# Patient Record
Sex: Female | Born: 1950
Health system: Southern US, Community
[De-identification: ages and names within clinical notes are randomized; demographics above are authoritative.]

## PROBLEM LIST (undated history)

## (undated) DIAGNOSIS — I499 Cardiac arrhythmia, unspecified: Secondary | ICD-10-CM

## (undated) DIAGNOSIS — D649 Anemia, unspecified: Secondary | ICD-10-CM

## (undated) DIAGNOSIS — G249 Dystonia, unspecified: Secondary | ICD-10-CM

## (undated) DIAGNOSIS — G894 Chronic pain syndrome: Secondary | ICD-10-CM

## (undated) DIAGNOSIS — G459 Transient cerebral ischemic attack, unspecified: Secondary | ICD-10-CM

## (undated) DIAGNOSIS — M797 Fibromyalgia: Secondary | ICD-10-CM

## (undated) DIAGNOSIS — N952 Postmenopausal atrophic vaginitis: Secondary | ICD-10-CM

## (undated) DIAGNOSIS — E785 Hyperlipidemia, unspecified: Secondary | ICD-10-CM

## (undated) DIAGNOSIS — B019 Varicella without complication: Secondary | ICD-10-CM

## (undated) DIAGNOSIS — K219 Gastro-esophageal reflux disease without esophagitis: Secondary | ICD-10-CM

## (undated) DIAGNOSIS — K635 Polyp of colon: Secondary | ICD-10-CM

## (undated) DIAGNOSIS — I4891 Unspecified atrial fibrillation: Secondary | ICD-10-CM

## (undated) DIAGNOSIS — M1990 Unspecified osteoarthritis, unspecified site: Secondary | ICD-10-CM

## (undated) DIAGNOSIS — F329 Major depressive disorder, single episode, unspecified: Secondary | ICD-10-CM

## (undated) DIAGNOSIS — I639 Cerebral infarction, unspecified: Secondary | ICD-10-CM

## (undated) DIAGNOSIS — F32A Depression, unspecified: Secondary | ICD-10-CM

## (undated) DIAGNOSIS — E049 Nontoxic goiter, unspecified: Secondary | ICD-10-CM

## (undated) HISTORY — DX: Unspecified osteoarthritis, unspecified site: M19.90

## (undated) HISTORY — DX: Transient cerebral ischemic attack, unspecified: G45.9

## (undated) HISTORY — PX: BIOPSY THYROID: PRO38

## (undated) HISTORY — DX: Varicella without complication: B01.9

## (undated) HISTORY — PX: LAMINECTOMY: SHX219

## (undated) HISTORY — DX: Postmenopausal atrophic vaginitis: N95.2

## (undated) HISTORY — DX: Chronic pain syndrome: G89.4

## (undated) HISTORY — PX: TONSILLECTOMY AND ADENOIDECTOMY: SUR1326

## (undated) HISTORY — DX: Hyperlipidemia, unspecified: E78.5

## (undated) HISTORY — DX: Depression, unspecified: F32.A

## (undated) HISTORY — DX: Major depressive disorder, single episode, unspecified: F32.9

## (undated) HISTORY — DX: Dystonia, unspecified: G24.9

## (undated) HISTORY — DX: Gastro-esophageal reflux disease without esophagitis: K21.9

## (undated) HISTORY — DX: Nontoxic goiter, unspecified: E04.9

## (undated) HISTORY — PX: ATRIAL FIBRILLATION ABLATION: SHX5732

## (undated) HISTORY — PX: OTHER SURGICAL HISTORY: SHX169

## (undated) HISTORY — DX: Unspecified atrial fibrillation: I48.91

## (undated) HISTORY — DX: Polyp of colon: K63.5

## (undated) HISTORY — DX: Fibromyalgia: M79.7

---

## 1991-09-23 HISTORY — PX: VAGINAL HYSTERECTOMY: SUR661

## 1997-09-22 HISTORY — PX: OTHER SURGICAL HISTORY: SHX169

## 1998-09-22 HISTORY — PX: OTHER SURGICAL HISTORY: SHX169

## 2007-09-09 ENCOUNTER — Encounter: Payer: Self-pay | Admitting: Family Medicine

## 2012-06-15 LAB — PULMONARY FUNCTION TEST

## 2013-08-24 LAB — PULMONARY FUNCTION TEST

## 2013-10-18 ENCOUNTER — Encounter: Payer: Self-pay | Admitting: Family Medicine

## 2013-10-18 LAB — HM COLONOSCOPY: HM COLON: NORMAL

## 2014-08-29 LAB — PULMONARY FUNCTION TEST

## 2015-01-18 ENCOUNTER — Encounter: Payer: Self-pay | Admitting: Family Medicine

## 2015-01-18 ENCOUNTER — Ambulatory Visit (INDEPENDENT_AMBULATORY_CARE_PROVIDER_SITE_OTHER): Payer: BLUE CROSS/BLUE SHIELD | Admitting: Family Medicine

## 2015-01-18 VITALS — BP 92/58 | HR 61 | Temp 98.1°F | Ht 68.0 in | Wt 161.0 lb

## 2015-01-18 DIAGNOSIS — E785 Hyperlipidemia, unspecified: Secondary | ICD-10-CM | POA: Insufficient documentation

## 2015-01-18 DIAGNOSIS — G249 Dystonia, unspecified: Secondary | ICD-10-CM | POA: Diagnosis not present

## 2015-01-18 DIAGNOSIS — G894 Chronic pain syndrome: Secondary | ICD-10-CM | POA: Diagnosis not present

## 2015-01-18 DIAGNOSIS — I480 Paroxysmal atrial fibrillation: Secondary | ICD-10-CM

## 2015-01-18 DIAGNOSIS — G459 Transient cerebral ischemic attack, unspecified: Secondary | ICD-10-CM | POA: Insufficient documentation

## 2015-01-18 DIAGNOSIS — F329 Major depressive disorder, single episode, unspecified: Secondary | ICD-10-CM | POA: Insufficient documentation

## 2015-01-18 DIAGNOSIS — M1990 Unspecified osteoarthritis, unspecified site: Secondary | ICD-10-CM | POA: Insufficient documentation

## 2015-01-18 DIAGNOSIS — I4891 Unspecified atrial fibrillation: Secondary | ICD-10-CM | POA: Insufficient documentation

## 2015-01-18 DIAGNOSIS — E049 Nontoxic goiter, unspecified: Secondary | ICD-10-CM

## 2015-01-18 DIAGNOSIS — K219 Gastro-esophageal reflux disease without esophagitis: Secondary | ICD-10-CM | POA: Insufficient documentation

## 2015-01-18 DIAGNOSIS — N952 Postmenopausal atrophic vaginitis: Secondary | ICD-10-CM | POA: Insufficient documentation

## 2015-01-18 DIAGNOSIS — F32A Depression, unspecified: Secondary | ICD-10-CM | POA: Insufficient documentation

## 2015-01-18 DIAGNOSIS — M797 Fibromyalgia: Secondary | ICD-10-CM

## 2015-01-18 DIAGNOSIS — K635 Polyp of colon: Secondary | ICD-10-CM | POA: Insufficient documentation

## 2015-01-18 NOTE — Patient Instructions (Signed)
Sign release of information at the front desk for Dr. Margo AyeHall and Dr. Mordecai MaesSanchez. Definitely need last colonoscopy, any immunizations.   I would check with your insurance to see which psychiatrists are covered. Danielle Rocha locally has a very good reputation if covered. I would use scripts from Dr. Margo AyeHall for refills until then and then they could always phone those in if needed.   Let's check in about 6 weeks from now and hopefully I have both sets of records.   Look forward to meeting

## 2015-01-18 NOTE — Assessment & Plan Note (Signed)
S:described as psychogenic dystonia. Pain and twisting from upper chest and up ith triggers "wind, creamy food" on TID ativan per psychiatry previously.  A/P: Need records to further understand but apparently has severe dystonic psychogenic pain if off ativan. Reported this medication was prescribed by psych which would be atypical. Encouraged patient to get established with local psychiatry and she plans on doing that. Has refills reportedly through prior provider but was unable to locate in purse today-told her I would be willing to rx zoloft and remeron but not chronic ativan and not above 5mg  [place]

## 2015-01-18 NOTE — Assessment & Plan Note (Signed)
S:Xarelto anticoagulation. Flecainide antiarrythmic. Sounds to be in sinus today A/P: as long as remains controlled, potentially could continue to care for here but also could consider referral to cards. Continue current rx.

## 2015-01-18 NOTE — Assessment & Plan Note (Signed)
S:On disability. History of bilateral hip pain, low back pain. States hip pain has been undiagnosed but had been considered to be bursitis in past. Later had back surgery and fusion and has had worsened pain in low back since that time. Gabapentin 400 BID, percocet 1 tablet daily per prior provider.  A/P: we will obtain records. If percocet is once daily, discussed I may continue depending on reason vs. Consider pain management or reduce to vicodin or even potentially take off.

## 2015-01-18 NOTE — Progress Notes (Signed)
Danielle Conch, MD Phone: [phone]  Subjective:  Patient presents today to establish care. Chief complaint-noted.   See problem oriented charting Reports tetanus shot within 2 years, pneumovax within 2 years, colon cancer screening this year.  ROS- no chest pain, shortness of breath, palpitations. Continued low back and hip pain  The following were reviewed and entered/updated in epic: Past Medical History  Diagnosis Date  . Arthritis     DJD, low back, thumb  . Chicken pox   . Depression     zoloft , remeron  per psychiatry. ambien  per psychiatry to help wtih sleep element.   Marland Kitchen GERD (gastroesophageal reflux disease)     omeprazole OTC  . Atrial fibrillation     Xarelto anticoagulation. Flecainide antiarrythmic   . Hyperlipidemia     lovastatin   . Colon polyp     awaiting records  . Goiter     states multiple imaging tests, has had biopsies  . Dystonia     described as psychogenic dystonia. Pain and twisting from upper chest and up ith triggers "wind, creamy food" on TID ativan per psychiatry previously.   . Vaginal atrophy     estrace vaginal cream  . Chronic pain syndrome     On disability. History of bilateral hip pain, low back pain. Gabapentin 400 BID, percocet 1 tablet daily per prior provider.    Patient Active Problem List   Diagnosis Date Noted  . Atrial fibrillation     Priority: High  . Chronic pain syndrome     Priority: High  . Dystonia     Priority: High  . TIA (transient ischemic attack) 01/18/2015    Priority: Medium  . Hyperlipidemia     Priority: Medium  . Depression     Priority: Medium  . Goiter     Priority: Medium  . Vaginal atrophy     Priority: Low  . GERD (gastroesophageal reflux disease)     Priority: Low  . Colon polyp     Priority: Low  . Arthritis     Priority: Low   Past Surgical History  Procedure Laterality Date  . Piriformis release  1999  . Fusion l4-l5  2000  . Tonsillectomy and adenoidectomy   age 61  . Abdominal hysterectomy  1993    excess bleeding-benign-including ovaries and cervix  . Other surgical history      tennis elbow surgery    Family History  Problem Relation Age of Onset  . Alcohol abuse Mother   . Hypertension Father   . Alcohol abuse Father   . Pancreatic cancer Father   . Cancer Brother     spindle cell RLE  . Cancer Brother     tonsil cancer    Medications- reviewed and updated Current Outpatient Prescriptions  Medication Sig Dispense Refill  . estradiol (ESTRACE) 0.1 MG/GM vaginal cream Place 2 Applicatorfuls vaginally 3 (three) times a week.    . flecainide (TAMBOCOR) 100 MG tablet Take 100 mg by mouth 2 (two) times daily.    Marland Kitchen gabapentin (NEURONTIN) 400 MG capsule Take 400 mg by mouth 2 (two) times daily.    Marland Kitchen LORazepam (ATIVAN) 1 MG tablet Take 1 mg by mouth 3 (three) times daily.    Marland Kitchen lovastatin (MEVACOR) 20 MG tablet Take 20 mg by mouth at bedtime.    . mirtazapine (REMERON) 30 MG tablet Take 30 mg by mouth at bedtime.    . Multiple Vitamins-Minerals (MULTIVITAMIN GUMMIES ADULTS PO) Take by mouth.    Marland Kitchen  omeprazole (PRILOSEC) 20 MG capsule Take 20 mg by mouth daily.    Marland Kitchen oxyCODONE-acetaminophen (PERCOCET/ROXICET) 5-325 MG per tablet Take 1 tablet by mouth daily.    . rivaroxaban (XARELTO) 20 MG TABS tablet Take 20 mg by mouth daily with supper.    . sertraline (ZOLOFT) 100 MG tablet Take 100 mg by mouth daily.    Marland Kitchen zolpidem (AMBIEN) 10 MG tablet Take 10 mg by mouth at bedtime as needed for sleep.       Allergies-reviewed and updated Allergies  Allergen Reactions  . Ciprofloxacin Diarrhea  . Clindamycin/Lincomycin Diarrhea  . Reglan [Metoclopramide]     Suicidal thoughts  . Vibramycin [Doxycycline] Diarrhea    History   Social History  . Marital Status: Married    Spouse Name: N/A  . Number of Children: N/A  . Years of Education: N/A   Social History Main Topics  . Smoking status: Never Smoker   . Smokeless tobacco: Not on file    . Alcohol Use: No  . Drug Use: No  . Sexual Activity: Not on file   Other Topics Concern  . Not on file   Social History Narrative   Family: Married. Husband works as Hospital doctor for [employer]. 2 children from previous marriage 1 from current. Son Jeannett Senior lives with them and has down's syndrome.       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 Moved to [facility] from [id number] and became disabled in that time frame due to hip/back issues.       Hobbies: time with son    ROS--See HPI/Subejctive , otherwise full ROS was completed and negative except as noted above  Objective: BP 92/58 mmHg  Pulse 61  Temp(Src) 98.1 F (36.7 C)  Ht  (1.727 m)  Wt 161 lb (73.029 kg)  BMI 24.49 kg/m2 Gen: NAD, resting comfortably HEENT: Mucous membranes are moist. Oropharynx normal. TM normal. Eyes: sclera and lids normal, PERRLA Neck: symmetrical diffuse thyroid enlargement (goiter), no cervical lymphadenopathy CV: RRR no murmurs rubs or gallops Lungs: CTAB no crackles, wheeze, rhonchi Abdomen: soft/nontender/nondistended/normal bowel sounds. No rebound or guarding.  Ext: no edema Skin: warm, dry, no rash Neuro: 5/5 strength in upper and lower extremities, normal gait, normal reflexes   Assessment/Plan:  Atrial fibrillation S:Xarelto anticoagulation. Flecainide antiarrythmic. Sounds to be in sinus today A/P: as long as remains controlled, potentially could continue to care for here but also could consider referral to cards. Continue current rx.     Chronic pain syndrome S:On disability. History of bilateral hip pain, low back pain. States hip pain has been undiagnosed but had been considered to be bursitis in past. Later had back surgery and fusion and has had worsened pain in low back since that time. Gabapentin 400 BID, percocet 1 tablet daily per prior provider.  A/P: we will obtain records. If percocet is once daily, discussed I may continue depending on  reason vs. Consider pain management or reduce to vicodin or even potentially take off.     Dystonia S:described as psychogenic dystonia. Pain and twisting from upper chest and up ith triggers "wind, creamy food" on TID ativan per psychiatry previously.  A/P: Need records to further understand but apparently has severe dystonic psychogenic pain if off ativan. Reported this medication was prescribed by psych which would be atypical. Encouraged patient to get established with local psychiatry and she plans on doing that. Has refills reportedly through prior provider but was unable to locate in purse  today-told her I would be willing to rx zoloft and remeron but not chronic ativan and not above 5mg  ambien    No rx provided today. Need records especially around narcotics/benzos. Follow up 6 weeks and hope to have records by then from both PCP and psychiatrist that was treating dystonia with benzos reportedly.

## 2015-01-19 NOTE — Progress Notes (Signed)
Records reviewed from psychiatry.   Initial eval on 01/30/14 by Dr. Margo AyeHall  "started having psychiatric problems after she developed medical problems/fibromyalgia in her late 30s in [id number]. starter with him pain, has received numerous treatments and surgeries that haven't helped. Became more and more depressed  She was treated with large regimen of pain medications which has been decreased. Deals with chronic pain which is up and down, varies with weather. Likes the summer. 'I hate the winter'  Was hospitalized for depression and SI 5 years ago. Reglan was found to be the offender, did better. Mood is ok for the most part, has been feeling more depressed recently. Worries often.   Started taking zoloft about a month ago. Feels a little jumpy on 50mg /am. Has been taking Remeron and Ambien for past few years.   Has Problems with dystonia, affects her mouth and upper body. Takes ativan as needed. Started about a year ago. ...  She has taken ritalin (hyped her up/increased her dystonia), wellbutrin (made her sick),  lexapro (helped her at first). cymbalta made her nauseous. Took inderal for a while).  ...  A/P  1. Continue her current dose of medications. Start cogentin 1mg /pm. Consider changing zoloft to lexapro next visit (feels anxious on zoloft).  "  Review of other pages mention dystonia controlled by psychiatric medications. Patient told me at visit she came off cogentin for unclear reasons.

## 2015-01-31 ENCOUNTER — Encounter: Payer: Self-pay | Admitting: Family Medicine

## 2015-02-06 DIAGNOSIS — M797 Fibromyalgia: Secondary | ICD-10-CM | POA: Insufficient documentation

## 2015-02-06 NOTE — Progress Notes (Addendum)
Records received from Dr. Mordecai MaesSanchez, prior PCP  Had been seen by cardiology, ENT, gastroenterology, urology, pan management, psychiatry.  Multiple ED visits and hospital admissions noted.   No paps or mammograms noted.   Pain management Pain was managed with percocet once a day by prior PCP most recently. Had been weaned from more frequent doses and doses up to 10/325 every 4 hours while also on fentanyl patch. had been on fentanyl patches in 2014. Gabapentin had also been tried Lidocaine patches used in 2015 03/08/14 Hip pain >5 years with severe discomfort and episodic flare ups. Unrelated to actvity.  03/04/13-mentions an ablation procedure scheduled without specifics. "she should not be definied by her pain and get free of the chronic meds"  Prior PCP #2 2012 Had prior x-rays of hip and spine 2012 scanned in. Degenerative changes primarily as well as laminectomy noted and spine fusion.   Prior PCP #3 2011 mentions "chronic bilateral hip pain and fibromyalgia" on narcotics  No copy of prior MRI noted-though "repeat MRI" mentioned at times in notes   Dystonia Controlled on BID ativan 1mg  through Dr. Margo AyeHall. Had been on cogentin but thought it made symptoms worse" On 06/27/14-"muscular twitching start in again-like previous episodes, no known reason. Has appt later in the month with Dr. Saunders RevelVonCoelin." continued ativan 01/23/14 "admits when she sees her therapist and she pulls on her arms she is able to lessen the movement" 01/24/15-"just saw Dr. Saunders RevelVonCoelin in movement disorders clinic-zoloft increased to 50mg "03/04/13- "[facility]. It is their opinion this is not organic and most likely represents a conversion reaction" mentions wind and stress as triggers. "It is the opinon of both Jefferson FuelBrenda Scribner and the nurologist that long term benzo use needs to be avoided and probably best medication is an SSRI. Dr. Meda KlinefelterScribner will see her monthly and counselor,  Fredia BeetsKaitlyn Waldrip, will see her weekly"  Depression Fatigue on wellbutrin, tolerated zoloft better On remeron previously SSRI preferred due to dystonia Marital discord noted.   Thyroid goiter 03/23/12- "stable last u/s stable for past two years and bx negative"  HLD-last notes state increase to atorvastatin 20mg 

## 2015-02-08 ENCOUNTER — Other Ambulatory Visit: Payer: Self-pay | Admitting: *Deleted

## 2015-02-08 DIAGNOSIS — Z1239 Encounter for other screening for malignant neoplasm of breast: Secondary | ICD-10-CM

## 2015-02-08 DIAGNOSIS — M810 Age-related osteoporosis without current pathological fracture: Secondary | ICD-10-CM

## 2015-03-01 ENCOUNTER — Encounter: Payer: Self-pay | Admitting: Family Medicine

## 2015-03-01 ENCOUNTER — Ambulatory Visit (INDEPENDENT_AMBULATORY_CARE_PROVIDER_SITE_OTHER): Payer: BLUE CROSS/BLUE SHIELD | Admitting: Family Medicine

## 2015-03-01 VITALS — BP 110/60 | HR 64 | Temp 98.2°F | Wt 162.0 lb

## 2015-03-01 DIAGNOSIS — G894 Chronic pain syndrome: Secondary | ICD-10-CM | POA: Diagnosis not present

## 2015-03-01 DIAGNOSIS — Z780 Asymptomatic menopausal state: Secondary | ICD-10-CM

## 2015-03-01 DIAGNOSIS — F32A Depression, unspecified: Secondary | ICD-10-CM

## 2015-03-01 DIAGNOSIS — F329 Major depressive disorder, single episode, unspecified: Secondary | ICD-10-CM

## 2015-03-01 DIAGNOSIS — G249 Dystonia, unspecified: Secondary | ICD-10-CM

## 2015-03-01 MED ORDER — HYDROCODONE-ACETAMINOPHEN 5-325 MG PO TABS: 0.5000 | ORAL_TABLET | Freq: Three times a day (TID) | ORAL | Status: DC | PRN

## 2015-03-01 NOTE — Assessment & Plan Note (Signed)
Reviewed prior notes which described this as as psychogenic dystonia (not organic per movement disorder clinic). Emerson Monte NP now managing ativan usage along with zoloft and ambien. Asked patient to continue regular follow up

## 2015-03-01 NOTE — Assessment & Plan Note (Signed)
Continued bilateral hip pain, low back pain. Percocet reportedly does not help very much but makes pain tolerable. She would prefer more level pain pattern and we opted to change to vicodin and may use 1/2 tab of 5/325 up to 3x a day. Patient has had extensive workup and multiple therapies tried. I am not sure referral would be beneficial. Plan would be not to go above 10 total of hydrocodone per day (current plan is 7.5). 3 months rx given. Will need pain contract with long term use and drug testing.

## 2015-03-01 NOTE — Patient Instructions (Addendum)
Get mammogram (call) and dexa/bone density (at check out)scheduled .  Glad you are having a good experience with Emerson Monte. I would love if you could have them send [place] a copy of their notes next time you see them.   Let's try vicodin 1/2 a tab 3x a day. You alternatively can take full tab in AM and half time in PM (still with total of 1.5 pills a day). Let's see if this can give you more stability/flexibility in your pain control  Follow up in 3 months. Tell Jeannett Senior I said hello!

## 2015-03-01 NOTE — Assessment & Plan Note (Signed)
Controlled. Continue follow up with psychiatry and continue meds zoloft 200mg  (recent increase), remeron 30mg  per psychiatry. ambien 10mg  per psychiatry to help with sleep element.

## 2015-03-01 NOTE — Progress Notes (Signed)
Tana Conch, MD  Subjective:  Danielle Rocha is a 64 y.o. year old very pleasant female patient who presents with:  Dystonia/Depression Seeing Emerson Monte NP. Increased zoloft to . Appointment upcoming as well. Feels like back to her "old old self, happier, able to laugh". Still deals with depression due to the pain she is dealing with. They prescribed the ativan 3x a day. They are prescribing ambien as well  Chronic Pain Has had injections in her hip and back including by pain doctor. Did not want stimulator in her spine. Has tried specialized treatments in [place] and spent 1000s of dollars on cleanses without relief. Prior notes showed workup by orthopedics, neurosurgery, pain management and continues to have hip and back pain. She feels reasonable for about 5 hours of the day after percocet and then experiences severe pain the rest of the day. Has the hardest time at night.   ROS- No SI/HI. Continued occasional dystonic movements- controlled with extra ativan (usually twice a day but has option for 3x a day through psych)  Past Medical History- atrial fibrillation, hyperlipidemia, history TIA  Medications- reviewed and updated Current Outpatient Prescriptions  Medication Sig Dispense Refill  . estradiol (ESTRACE) 0.1 MG/GM vaginal cream Place 2 Applicatorfuls vaginally 3 (three) times a week.    . flecainide (TAMBOCOR) 100 MG tablet Take 100 mg by mouth 2 (two) times daily.    Marland Kitchen gabapentin (NEURONTIN) 400 MG capsule Take 400 mg by mouth 2 (two) times daily.    Marland Kitchen LORazepam (ATIVAN) 1 MG tablet Take 1 mg by mouth 3 (three) times daily.    Marland Kitchen lovastatin (MEVACOR) 20 MG tablet Take 20 mg by mouth at bedtime.    . mirtazapine (REMERON) 30 MG tablet Take 30 mg by mouth at bedtime.    . Multiple Vitamins-Minerals (MULTIVITAMIN GUMMIES ADULTS PO) Take by mouth.    Marland Kitchen omeprazole (PRILOSEC) 20 MG capsule Take 20 mg by mouth daily.    Marland Kitchen oxyCODONE-acetaminophen (PERCOCET/ROXICET)  5-325 MG per tablet Take 1 tablet by mouth daily.    . rivaroxaban (XARELTO) 20 MG TABS tablet Take 20 mg by mouth daily with supper.    . sertraline (ZOLOFT) 100 MG tablet Take 100 mg by mouth 2 (two) times daily.     Marland Kitchen zolpidem (AMBIEN) 10 MG tablet Take 10 mg by mouth at bedtime as needed for sleep.     No current facility-administered medications for this visit.    Objective: BP 110/60 mmHg  Pulse 64  Temp(Src) 98.2 F (36.8 C)  Wt 162 lb (73.483 kg) Gen: NAD, resting comfortably CV: RRR no murmurs rubs or gallops Lungs: CTAB no crackles, wheeze, rhonchi Abdomen: soft/nontender/nondistended/normal bowel sounds.   Pain with palpation over bilateral hips and low back. Normal gait. Normal strength in legs.  Ext: no edema Skin: warm, dry, no rash Neuro: grossly normal, moves all extremities   Assessment/Plan:  Chronic pain syndrome Continued bilateral hip pain, low back pain. Percocet reportedly does not help very much but makes pain tolerable. She would prefer more level pain pattern and we opted to change to vicodin and may use 1/2 tab of 5/325 up to 3x a day. Patient has had extensive workup and multiple therapies tried. I am not sure referral would be beneficial. Plan would be not to go above 10 total of hydrocodone per day (current plan is 7.5). 3 months rx given. Will need pain contract with long term use and drug testing.   Dystonia Reviewed prior notes which  described this as as psychogenic dystonia (not organic per movement disorder clinic). Emerson Monte NP now managing ativan usage along with zoloft and ambien. Asked patient to continue regular follow up  Depression Controlled. Continue follow up with psychiatry and continue meds zoloft 200mg  (recent increase), remeron 30mg  per psychiatry. ambien 10mg  per psychiatry to help with sleep element.   3 month follow up   Orders Placed This Encounter  Procedures  . DG Bone Density    Standing Status: Future     Number  of Occurrences:      Standing Expiration Date: 04/30/2016    Order Specific Question:  Reason for Exam (SYMPTOM  OR DIAGNOSIS REQUIRED)    Answer:  post menopausal    Order Specific Question:  Preferred imaging location?    Answer:  [facility]    Meds ordered this encounter  Medications  .  HYDROcodone-acetaminophen (NORCO/VICODIN) 5-325 MG per tablet    Sig: Take 0.5 tablets by mouth 3 (three) times daily as needed. May fill 03/01/15    Dispense:  45 tablet    Refill:  0  .  HYDROcodone-acetaminophen (NORCO/VICODIN) 5-325 MG per tablet    Sig: Take 0.5 tablets by mouth 3 (three) times daily as needed. May fill 03/31/15    Dispense:  45 tablet    Refill:  0  . HYDROcodone-acetaminophen (NORCO/VICODIN) 5-325 MG per tablet    Sig: Take 0.5 tablets by mouth 3 (three) times daily as needed. May fill 05/01/15    Dispense:  45 tablet    Refill:  0   >50% of 25 minute office visit was spent on counseling (dealing with chronic pain, realizing likely to have continued pain, discussion about referral options which patient largely nor interested in as not sure what could be offered that has not been tried, encouraging continued psych follow up) and coordination of care

## 2015-03-09 ENCOUNTER — Ambulatory Visit: Payer: Self-pay | Admitting: Family Medicine

## 2015-05-07 ENCOUNTER — Other Ambulatory Visit: Payer: Self-pay | Admitting: Family Medicine

## 2015-05-07 MED ORDER — RIVAROXABAN 20 MG PO TABS: 20.0000 mg | ORAL_TABLET | Freq: Every day | ORAL | Status: DC

## 2015-05-07 NOTE — Telephone Encounter (Signed)
Pt asking for a 90 day rx.

## 2015-06-01 ENCOUNTER — Encounter: Payer: Self-pay | Admitting: Family Medicine

## 2015-06-01 ENCOUNTER — Ambulatory Visit (INDEPENDENT_AMBULATORY_CARE_PROVIDER_SITE_OTHER): Payer: BLUE CROSS/BLUE SHIELD | Admitting: Family Medicine

## 2015-06-01 VITALS — BP 116/68 | HR 69 | Temp 98.2°F | Wt 164.0 lb

## 2015-06-01 DIAGNOSIS — I480 Paroxysmal atrial fibrillation: Secondary | ICD-10-CM

## 2015-06-01 DIAGNOSIS — N950 Postmenopausal bleeding: Secondary | ICD-10-CM

## 2015-06-01 DIAGNOSIS — G894 Chronic pain syndrome: Secondary | ICD-10-CM | POA: Diagnosis not present

## 2015-06-01 MED ORDER — HYDROCODONE-ACETAMINOPHEN 5-325 MG PO TABS: 0.5000 | ORAL_TABLET | Freq: Three times a day (TID) | ORAL | Status: DC | PRN

## 2015-06-01 NOTE — Patient Instructions (Addendum)
Get mammogram scheduled.  We will call you within a week about your referral to cardiology and gynecology. If you do not hear within 2 weeks, give [place] a call.   Refilled meds for 3 months  Make sure to get your flu shot

## 2015-06-01 NOTE — Assessment & Plan Note (Signed)
S:Chronic pain syndrome- bilateral hip, low back. 1/2 tab vicodin 3x a day from percocet once a day. She notices this does not help as much with each dose but gives her more sustained results. Hoarse in afternoons or when pain gets worse. Hips hurt with activity A/P: refilled vicodin to use a total of 7.5mg  per day. Discussed could use  percocet total per day such as 1/2 tab BID but she would prefer TID dosing. Follow up 3 months.

## 2015-06-01 NOTE — Assessment & Plan Note (Signed)
S: Patient was seeing cardiology on yearly basis and would liek to reestablish. Not in a fib today. On Xarelto anticoagulation. Flecainide antiarrythmic. Has not had any episodes of a fib reportedly since being on flecainide. Has had ablation in past.  A/P: refer to a fib clinic per patient request. Fortunately stable and doing well.

## 2015-06-01 NOTE — Progress Notes (Signed)
Tana Conch, MD  Subjective:  Danielle Rocha is a 64 y.o. year old very pleasant female patient who presents for/with See problem oriented charting ROS- no chest pain, shortness of breath, palpitations. No abdominal pain. Chronic hip and low back pain. Continues to have tight neck and dystonic reactions at times.   Past Medical History-  Patient Active Problem List   Diagnosis Date Noted  . Fibromyalgia 02/06/2015    Priority: High  . Atrial fibrillation     Priority: High  . Chronic pain syndrome     Priority: High  . Dystonia     Priority: High  . Colon polyp     Priority: High  . TIA (transient ischemic attack) 01/18/2015    Priority: Medium  . Hyperlipidemia     Priority: Medium  . Depression     Priority: Medium  . Goiter     Priority: Medium  . Vaginal atrophy     Priority: Low  . GERD (gastroesophageal reflux disease)     Priority: Low  . Arthritis     Priority: Low    Medications- reviewed and updated Current Outpatient Prescriptions  Medication Sig Dispense Refill  . estradiol (ESTRACE) 0.1 MG/GM vaginal cream Place 2 Applicatorfuls vaginally 3 (three) times a week.    . flecainide (TAMBOCOR) 100 MG tablet Take 100 mg by mouth 2 (two) times daily.    Marland Kitchen gabapentin (NEURONTIN) 400 MG capsule Take 400 mg by mouth 2 (two) times daily.    Marland Kitchen LORazepam (ATIVAN) 1 MG tablet Take 1 mg by mouth 3 (three) times daily.    Marland Kitchen lovastatin (MEVACOR) 20 MG tablet Take 20 mg by mouth at bedtime.    . mirtazapine (REMERON) 30 MG tablet Take 30 mg by mouth at bedtime.    . Multiple Vitamins-Minerals (MULTIVITAMIN GUMMIES ADULTS PO) Take by mouth.    Marland Kitchen omeprazole (PRILOSEC) 20 MG capsule Take 20 mg by mouth daily.    . rivaroxaban (XARELTO) 20 MG TABS tablet Take 1 tablet (20 mg total) by mouth daily with supper. 90 tablet 3  . sertraline (ZOLOFT) 100 MG tablet Take 100 mg by mouth 2 (two) times daily.     Marland Kitchen HYDROcodone-acetaminophen (NORCO/VICODIN) 5-325 MG per tablet Take 0.5  tablets by mouth 3 (three) times daily as needed. May fill 08/01/15 46 tablet 0  . zolpidem (AMBIEN) 10 MG tablet Take 10 mg by mouth at bedtime as needed for sleep.     No current facility-administered medications for this visit.    Objective: BP 116/68 mmHg  Pulse 69  Temp(Src) 98.2 F (36.8 C)  Wt 164 lb (74.39 kg) Gen: NAD, resting comfortably, reports pain in hips and low back CV: RRR no murmurs rubs or gallops Lungs: CTAB no crackles, wheeze, rhonchi Abdomen: soft/nontender/nondistended/normal bowel sounds. No rebound or guarding.  Ext: trace edema Skin: warm, dry, no rash  Neuro: grossly normal, moves all extremities  Assessment/Plan:  Chronic pain syndrome S:Chronic pain syndrome- bilateral hip, low back. 1/2 tab vicodin 3x a day from percocet once a day. She notices this does not help as much with each dose but gives her more sustained results. Hoarse in afternoons or when pain gets worse. Hips hurt with activity A/P: refilled vicodin to use a total of 7.5mg  per day. Discussed could use  percocet total per day such as 1/2 tab BID but she would prefer TID dosing. Follow up 3 months.    Atrial fibrillation S: Patient was seeing cardiology on yearly basis  and would liek to reestablish. Not in a fib today. On Xarelto anticoagulation. Flecainide antiarrythmic. Has not had any episodes of a fib reportedly since being on flecainide. Has had ablation in past.  A/P: refer to a fib clinic per patient request. Fortunately stable and doing well.    Postmenopausal bleeding  S:Vaginal discharge- at times bloody, brown. 2-3 months sporadic. Hysterectomy. Does use estrace cream. Not sexually active.  A/P: Offered GYN exam today for wet prep, patient would prefer to follow up with GYN- does not have uterus or cervix reportedly.   In related womens health issues- advised patient to get her mammogram 3 month follow up   Orders Placed This Encounter  Procedures  . Ambulatory referral  to Gynecology    Referral Priority:  Routine    Referral Type:  Consultation    Referral Reason:  Specialty Services Required    Requested Specialty:  Gynecology    Number of Visits Requested:  1  . Ambulatory referral to Cardiology    Referral Priority:  Routine    Referral Type:  Consultation    Referral Reason:  Specialty Services Required    Requested Specialty:  Cardiology    Number of Visits Requested:  1    Meds ordered this encounter  Medications  .  HYDROcodone-acetaminophen (NORCO/VICODIN) 5-325 MG per tablet    Sig: Take 0.5 tablets by mouth 3 (three) times daily as needed. May fill 06/01/15    Dispense:  46 tablet    Refill:  0  . : HYDROcodone-acetaminophen (NORCO/VICODIN) 5-325 MG per tablet    Sig: Take 0.5 tablets by mouth 3 (three) times daily as needed. May fill 07/01/15    Dispense:  46 tablet    Refill:  0  . HYDROcodone-acetaminophen (NORCO/VICODIN) 5-325 MG per tablet    Sig: Take 0.5 tablets by mouth 3 (three) times daily as needed. May fill 08/01/15    Dispense:  46 tablet    Refill:  0

## 2015-06-06 ENCOUNTER — Ambulatory Visit (INDEPENDENT_AMBULATORY_CARE_PROVIDER_SITE_OTHER): Payer: BLUE CROSS/BLUE SHIELD | Admitting: Obstetrics and Gynecology

## 2015-06-06 ENCOUNTER — Encounter: Payer: Self-pay | Admitting: Obstetrics and Gynecology

## 2015-06-06 VITALS — BP 118/60 | HR 64 | Resp 14 | Ht 68.0 in | Wt 162.0 lb

## 2015-06-06 DIAGNOSIS — N816 Rectocele: Secondary | ICD-10-CM | POA: Diagnosis not present

## 2015-06-06 DIAGNOSIS — N952 Postmenopausal atrophic vaginitis: Secondary | ICD-10-CM

## 2015-06-06 DIAGNOSIS — N950 Postmenopausal bleeding: Secondary | ICD-10-CM | POA: Diagnosis not present

## 2015-06-06 MED ORDER — ESTRADIOL 10 MCG VA TABS: ORAL_TABLET | VAGINAL | Status: DC

## 2015-06-06 NOTE — Patient Instructions (Signed)
Look for a coupon on line for Vagifem

## 2015-06-06 NOTE — Progress Notes (Signed)
Patient ID: Danielle Rocha, female   DOB: Nov 12, 1950, 64 y.o.   MRN: [id number] 64 y.o. [id number] MarriedCaucasianF, sent by Dr Tana Conch for a consultation for vaginal spotting.  The patient has a h/o a TVH in the early 90's for abnormal bleeding. She c/o sporadic red vaginal spotting for the last 1.5 months, in the last week it has been daily and brown. She had been using estrace cream intermittently. Last used it 6 weeks ago. Was using it about 1 x a week. She is sexually active without penetration, which is fine for them.  She denies pruritus or discomfort. She is sure it is coming from her vagina. She has a known rectocele and cystocele. She does have a bulge with valsalva, occasionally needs to push on the perineum to defecate. No bladder issues. No abdominal pain.     No LMP recorded. Patient is postmenopausal.          Sexually active: No.  She is without penetration The current method of family planning is status post hysterectomy.    Exercising: Yes.    walking Smoker:  no  Health Maintenance: Pap:  Years ago History of abnormal Pap:  no MMG:  2014 normal Colonoscopy:  10-18-13 polyps  BMD:   1999 normal  TDaP: 06-13-12 Gardasil: N/A   reports that she has never smoked. She has never used smokeless tobacco. She reports that she does not drink alcohol or use illicit drugs.  Past Medical History  Diagnosis Date  . Arthritis     DJD, low back, thumb  . Chicken pox   . Depression     zoloft 100mg , remeron 30mg  per psychiatry. ambien 10mg  per psychiatry to help wtih sleep element.   Marland Kitchen GERD (gastroesophageal reflux disease)     omeprazole OTC  . Atrial fibrillation     Xarelto anticoagulation. Flecainide antiarrythmic   . Hyperlipidemia     lovastatin 20mg   . Colon polyp     awaiting records  . Goiter     states multiple imaging tests, has had biopsies  . Dystonia     described as psychogenic dystonia. Pain and twisting from upper chest and up ith triggers "wind, creamy  food" on TID ativan per psychiatry previously.   . Vaginal atrophy     estrace vaginal cream  . Chronic pain syndrome     On disability. History of bilateral hip pain, low back pain. Gabapentin 400 BID, percocet 1 tablet daily per prior provider.   . Fibromyalgia   . TIA (transient ischemic attack)     Past Surgical History  Procedure Laterality Date  . Piriformis release  1999  . Fusion l4-l5  2000  . Tonsillectomy and adenoidectomy  age 85  . Vaginal hysterectomy  1993  . Other surgical history      tennis elbow surgery  . Biopsy thyroid    . Laminectomy      L4-L5  . Atrial fibrillation ablation      Current Outpatient Prescriptions  Medication Sig Dispense Refill  . flecainide (TAMBOCOR) 100 MG tablet Take 100 mg by mouth 2 (two) times daily.    Marland Kitchen gabapentin (NEURONTIN) 400 MG capsule Take 400 mg by mouth 2 (two) times daily.    Marland Kitchen HYDROcodone-acetaminophen (NORCO/VICODIN) 5-325 MG per tablet Take 0.5 tablets by mouth 3 (three) times daily as needed. May fill 08/01/15 46 tablet 0  . lamoTRIgine (LAMICTAL) 25 MG tablet Take 25 mg by mouth daily.    Marland Kitchen LORazepam (ATIVAN) 1  MG tablet Take 1 mg by mouth 3 (three) times daily.    Marland Kitchen lovastatin (MEVACOR) 20 MG tablet Take 20 mg by mouth at bedtime.    . mirtazapine (REMERON) 30 MG tablet Take 30 mg by mouth at bedtime.    . Multiple Vitamins-Minerals (MULTIVITAMIN GUMMIES ADULTS PO) Take by mouth.    Marland Kitchen omeprazole (PRILOSEC) 20 MG capsule Take 20 mg by mouth daily.    . rivaroxaban (XARELTO) 20 MG TABS tablet Take 1 tablet (20 mg total) by mouth daily with supper. 90 tablet 3  . sertraline (ZOLOFT) 100 MG tablet Take 100 mg by mouth 2 (two) times daily.     Marland Kitchen zolpidem (AMBIEN) 10 MG tablet Take 10 mg by mouth at bedtime as needed for sleep.    . Estradiol (VAGIFEM) 10 MCG TABS vaginal tablet Place 1 tablet vaginal qhs x 1 week, the use 1 tablet per vagina 2 x a week at bedtime 24 tablet 4   No current facility-administered medications  for this visit.    Family History  Problem Relation Age of Onset  . Alcohol abuse Mother   . Hypertension Father   . Alcohol abuse Father   . Pancreatic cancer Father   . Cancer Brother     spindle cell RLE  . Cancer Brother     tonsil cancer  . Skin cancer Brother     Review of Systems  Exam:   BP 118/60 mmHg  Pulse 64  Resp 14  Ht  (1.727 m)  Wt 162 lb (73.483 kg)  BMI 24.64 kg/m2  Weight change: @ Height:   Height:  (172.7 cm)  Ht Readings from Last 3 Encounters:  06/06/15  (1.727 m)  01/18/15  (1.727 m)    General appearance: alert, cooperative and appears stated age Abdomen: soft, non-tender;  no masses   Pelvic: External genitalia:  no lesions, mild atrophy              Urethra:  normal appearing urethra with no masses, tenderness or lesions              Bartholins and Skenes: normal                 Vagina: atrophic vaginal mucosa, one spot of friability at the vaginal cuff, no lesions, treated with silver nitrate. Grade 1 rectocele with valsalva               Cervix: absent               Bimanual Exam:  Uterus:  uterus absent              Adnexa: No masses, mildly tender in the left adnexal region.               Rectovaginal: Confirms               Anus:  normal sphincter tone, no lesions  Chaperone was present for exam.  A:  PMP vaginal bleeding, s/p TVH  Atrophic vaginal mucosa, friable  Rectocele, mild, not symptomatic  P:   Start vagifem (she doesn't like estrace cream), use 1 tablet qhs x 1 week, then 1 x 2 x a week  She will return if the spotting persists  Return if she develops symptoms from her rectocele     Cc: Dr Tana Conch

## 2015-07-21 ENCOUNTER — Ambulatory Visit (INDEPENDENT_AMBULATORY_CARE_PROVIDER_SITE_OTHER): Payer: BLUE CROSS/BLUE SHIELD | Admitting: Family Medicine

## 2015-07-21 ENCOUNTER — Encounter: Payer: Self-pay | Admitting: Family Medicine

## 2015-07-21 VITALS — BP 118/64 | HR 67 | Temp 98.5°F | Ht 68.0 in

## 2015-07-21 DIAGNOSIS — M25562 Pain in left knee: Secondary | ICD-10-CM | POA: Diagnosis not present

## 2015-07-21 NOTE — Patient Instructions (Signed)
[facility] and ice several times per day.

## 2015-07-21 NOTE — Progress Notes (Signed)
Pre visit review using our clinic review tool, if applicable. No additional management support is needed unless otherwise documented below in the visit note. 

## 2015-07-21 NOTE — Progress Notes (Signed)
Subjective:    Patient ID: Danielle Rocha, female    DOB: 08/31/1951, 64 y.o.   MRN: [id number]  HPI Patient seen Saturday clinic with left anterior knee pain. Last Sunday she was at church and slipped on something on the floor and fell and landed directly onto her left knee. She noticed some bruising and pain right away. She's not done any icing. She takes Xarelto for atrial fibrillation and thus avoids nonsteroidals. She's had some pain with ambulation since then.  She also some bruising. She's not had any locking or giving way of the knee. Pain is moderate. Patient also yesterday was lifting some furniture and noticed sharp pain lateral aspect of the right side of the sternum near the costochondral junction. No pain with deep breathing  Past Medical History  Diagnosis Date  . Arthritis     DJD, low back, thumb  . Chicken pox   . Depression     zoloft 100mg , remeron 30mg  per psychiatry. ambien 10mg  per psychiatry to help wtih sleep element.   Marland Kitchen. GERD (gastroesophageal reflux disease)     omeprazole OTC  . Atrial fibrillation (HCC)     Xarelto anticoagulation. Flecainide antiarrythmic   . Hyperlipidemia     lovastatin 20mg   . Colon polyp     awaiting records  . Goiter     states multiple imaging tests, has had biopsies  . Dystonia     described as psychogenic dystonia. Pain and twisting from upper chest and up ith triggers "wind, creamy food" on TID ativan per psychiatry previously.   . Vaginal atrophy     estrace vaginal cream  . Chronic pain syndrome     On disability. History of bilateral hip pain, low back pain. Gabapentin 400 BID, percocet 1 tablet daily per prior provider.   . Fibromyalgia   . TIA (transient ischemic attack)    Past Surgical History  Procedure Laterality Date  . Piriformis release  1999  . Fusion l4-l5  2000  . Tonsillectomy and adenoidectomy  age 89  . Vaginal hysterectomy  1993  . Other surgical history      tennis elbow surgery  . Biopsy thyroid     . Laminectomy      L4-L5  . Atrial fibrillation ablation      reports that she has never smoked. She has never used smokeless tobacco. She reports that she does not drink alcohol or use illicit drugs. family history includes Alcohol abuse in her father and mother; Cancer in her brother and brother; Hypertension in her father; Pancreatic cancer in her father; Skin cancer in her brother. Allergies  Allergen Reactions  . Fish Allergy Anaphylaxis    Shell Fish   . Ciprofloxacin Diarrhea  . Clindamycin/Lincomycin Diarrhea  . Reglan [Metoclopramide]     Suicidal thoughts  . Vibramycin [Doxycycline] Diarrhea  . Sulfa Antibiotics Rash      Review of Systems  Neurological: Negative for weakness.       Objective:   Physical Exam  Constitutional: She appears well-developed and well-nourished.  Cardiovascular: Normal rate.   Pulmonary/Chest: Effort normal and breath sounds normal. No respiratory distress. She has no wheezes. She has no rales.  Musculoskeletal:  Left knee reveals some bruising anteriorly. She has small prepatellar effusion. No evidence for knee effusion otherwise. No erythema. Full range of motion. No joint line tenderness. Ligament testing is normal. She has some tenderness over the inferior pole of patella  Assessment & Plan:  Left anterior knee pain. She has small traumatic prepatellar fusion. No signs of infection.  Obtain x-rays including sunrise view. Rule out patellar fracture. We discussed going for x-rays today but given that this happened 6 days ago and she is ambulating without much difficulty she will go Monday morning for x-rays. Avoid nonsteroidals with chronic Xarelto use. Continue ice and elevation

## 2015-07-23 ENCOUNTER — Ambulatory Visit (INDEPENDENT_AMBULATORY_CARE_PROVIDER_SITE_OTHER)
Admission: RE | Admit: 2015-07-23 | Discharge: 2015-07-23 | Disposition: A | Discharge status: Home / Self Care | Payer: BLUE CROSS/BLUE SHIELD | Source: Ambulatory Visit | Attending: Family Medicine | Admitting: Family Medicine

## 2015-07-23 ENCOUNTER — Other Ambulatory Visit: Payer: Self-pay | Admitting: Family Medicine

## 2015-07-23 DIAGNOSIS — M25562 Pain in left knee: Secondary | ICD-10-CM | POA: Diagnosis not present

## 2015-07-24 ENCOUNTER — Telehealth: Payer: Self-pay | Admitting: Family Medicine

## 2015-07-24 NOTE — Telephone Encounter (Signed)
Pt.notified

## 2015-07-24 NOTE — Telephone Encounter (Signed)
See below

## 2015-07-24 NOTE — Telephone Encounter (Signed)
No fracture was found. You can read her full results below as well. Does have some mild arthritis.   IMPRESSION: No acute or traumatic finding other than mild prepatellar soft tissue swelling. Mild joint space narrowing in the medial weight-bearing compartment and lateral patellofemoral joint.

## 2015-07-24 NOTE — Telephone Encounter (Signed)
Pt saw dr Caryl [facility] and dr Caryl Neverburchette ordered xrays for Monday. Pt state Dr Caryl NeverBurchette  was to forward to dr hunter, (dr burchette out this week) Pt would like results of xray asap.

## 2015-07-26 ENCOUNTER — Telehealth: Payer: Self-pay | Admitting: *Deleted

## 2015-07-26 NOTE — Telephone Encounter (Signed)
This should be psychiatry to refill- seeing Danielle Rocha office

## 2015-07-26 NOTE — Telephone Encounter (Signed)
Refill ok? 

## 2015-07-26 NOTE — Telephone Encounter (Signed)
Patient is requesting a refill of  LORazepam (ATIVAN) 1 MG tablet [facility], [address], [place]

## 2015-07-26 NOTE — Telephone Encounter (Signed)
Pt.notified

## 2015-08-04 ENCOUNTER — Emergency Department (HOSPITAL_COMMUNITY)
Admission: EM | Admit: 2015-08-04 | Discharge: 2015-08-04 | Disposition: A | Discharge status: Home / Self Care | Payer: BLUE CROSS/BLUE SHIELD | Attending: Emergency Medicine | Admitting: Emergency Medicine

## 2015-08-04 ENCOUNTER — Encounter (HOSPITAL_COMMUNITY): Payer: Self-pay

## 2015-08-04 DIAGNOSIS — M159 Polyosteoarthritis, unspecified: Secondary | ICD-10-CM | POA: Diagnosis not present

## 2015-08-04 DIAGNOSIS — Y9289 Other specified places as the place of occurrence of the external cause: Secondary | ICD-10-CM | POA: Insufficient documentation

## 2015-08-04 DIAGNOSIS — Z79899 Other long term (current) drug therapy: Secondary | ICD-10-CM | POA: Diagnosis not present

## 2015-08-04 DIAGNOSIS — Y998 Other external cause status: Secondary | ICD-10-CM | POA: Diagnosis not present

## 2015-08-04 DIAGNOSIS — Z86010 Personal history of colonic polyps: Secondary | ICD-10-CM | POA: Insufficient documentation

## 2015-08-04 DIAGNOSIS — F329 Major depressive disorder, single episode, unspecified: Secondary | ICD-10-CM | POA: Diagnosis not present

## 2015-08-04 DIAGNOSIS — X58XXXA Exposure to other specified factors, initial encounter: Secondary | ICD-10-CM | POA: Diagnosis not present

## 2015-08-04 DIAGNOSIS — M797 Fibromyalgia: Secondary | ICD-10-CM | POA: Diagnosis not present

## 2015-08-04 DIAGNOSIS — Z8673 Personal history of transient ischemic attack (TIA), and cerebral infarction without residual deficits: Secondary | ICD-10-CM | POA: Diagnosis not present

## 2015-08-04 DIAGNOSIS — G894 Chronic pain syndrome: Secondary | ICD-10-CM | POA: Diagnosis not present

## 2015-08-04 DIAGNOSIS — Y9389 Activity, other specified: Secondary | ICD-10-CM | POA: Diagnosis not present

## 2015-08-04 DIAGNOSIS — K219 Gastro-esophageal reflux disease without esophagitis: Secondary | ICD-10-CM | POA: Insufficient documentation

## 2015-08-04 DIAGNOSIS — E785 Hyperlipidemia, unspecified: Secondary | ICD-10-CM | POA: Diagnosis not present

## 2015-08-04 DIAGNOSIS — Z8619 Personal history of other infectious and parasitic diseases: Secondary | ICD-10-CM | POA: Diagnosis not present

## 2015-08-04 DIAGNOSIS — I4891 Unspecified atrial fibrillation: Secondary | ICD-10-CM | POA: Diagnosis not present

## 2015-08-04 DIAGNOSIS — Z7901 Long term (current) use of anticoagulants: Secondary | ICD-10-CM | POA: Diagnosis not present

## 2015-08-04 DIAGNOSIS — S0502XA Injury of conjunctiva and corneal abrasion without foreign body, left eye, initial encounter: Secondary | ICD-10-CM | POA: Insufficient documentation

## 2015-08-04 DIAGNOSIS — Z8742 Personal history of other diseases of the female genital tract: Secondary | ICD-10-CM | POA: Diagnosis not present

## 2015-08-04 DIAGNOSIS — S0592XA Unspecified injury of left eye and orbit, initial encounter: Secondary | ICD-10-CM | POA: Diagnosis present

## 2015-08-04 MED ORDER — ERYTHROMYCIN 5 MG/GM OP OINT: TOPICAL_OINTMENT | OPHTHALMIC | Status: DC

## 2015-08-04 MED ORDER — TETRACAINE HCL 0.5 % OP SOLN
1.0000 [drp] | Freq: Once | OPHTHALMIC | Status: AC
  Administered 2015-08-04: 1 [drp] via OPHTHALMIC
  Filled 2015-08-04: qty 2

## 2015-08-04 MED ORDER — FLUORESCEIN SODIUM 1 MG OP STRP
1.0000 | ORAL_STRIP | Freq: Once | OPHTHALMIC | Status: AC
  Administered 2015-08-04: 1 via OPHTHALMIC
  Filled 2015-08-04: qty 1

## 2015-08-04 NOTE — ED Notes (Signed)
She c/o left eye redness/irritation which she suspects is d/t using false eyelashes.

## 2015-08-04 NOTE — ED Provider Notes (Signed)
CSN: [id number]     Arrival date & time 08/04/15  9629 History   First MD Initiated Contact with Patient 08/04/15 1004     Chief Complaint  Patient presents with  . Eye Problem   HPI   64 year old female presents today with eye irritation. Patient reports that 2 days ago she was having false eyelashes put on her eyes when she felt pain to the medial aspect of the globe, she is uncertain if this was blue, or being touched by the person putting them on. She reports the pain has persisted since that time with irritation. She denies light sensitivity, but reports her eye waters with light exposure. Patient reports very minimal blurriness out of the left eye, but no significant changes in vision. She reports the redness is located on the medial aspect, no discharge from the eye. No painful vision or pain with ocular movements. Patient is not tried any over-the-counter medications prior to arrival. Patient does not wear contact lenses.   Past Medical History  Diagnosis Date  . Arthritis     DJD, low back, thumb  . Chicken pox   . Depression     zoloft , remeron  per psychiatry. ambien  per psychiatry to help wtih sleep element.   Marland Kitchen GERD (gastroesophageal reflux disease)     omeprazole OTC  . Atrial fibrillation (HCC)     Xarelto anticoagulation. Flecainide antiarrythmic   . Hyperlipidemia     lovastatin   . Colon polyp     awaiting records  . Goiter     states multiple imaging tests, has had biopsies  . Dystonia     described as psychogenic dystonia. Pain and twisting from upper chest and up ith triggers "wind, creamy food" on TID ativan per psychiatry previously.   . Vaginal atrophy     estrace vaginal cream  . Chronic pain syndrome     On disability. History of bilateral hip pain, low back pain. Gabapentin 400 BID, percocet 1 tablet daily per prior provider.   . Fibromyalgia   . TIA (transient ischemic attack)    Past Surgical History  Procedure Laterality Date  .  Piriformis release  1999  . Fusion l4-l5  2000  . Tonsillectomy and adenoidectomy  age 55  . Vaginal hysterectomy  1993  . Other surgical history      tennis elbow surgery  . Biopsy thyroid    . Laminectomy      L4-L5  . Atrial fibrillation ablation     Family History  Problem Relation Age of Onset  . Alcohol abuse Mother   . Hypertension Father   . Alcohol abuse Father   . Pancreatic cancer Father   . Cancer Brother     spindle cell RLE  . Cancer Brother     tonsil cancer  . Skin cancer Brother    Social History  Substance Use Topics  . Smoking status: Never Smoker   . Smokeless tobacco: Never Used  . Alcohol Use: No   OB History    Gravida Para Term Preterm AB TAB SAB Ectopic Multiple Living   Review of Systems  All other systems reviewed and are negative.   Allergies  Fish allergy; Ciprofloxacin; Clindamycin/lincomycin; Reglan; Vibramycin; and Sulfa antibiotics  Home Medications   Prior to Admission medications   Medication Sig Start Date End Date Taking? Authorizing Provider  Estradiol (VAGIFEM) 10 MCG TABS vaginal  tablet Place 1 tablet vaginal qhs x 1 week, the use 1 tablet per vagina 2 x a week at bedtime Patient taking differently: Use 1 tablet per vagina 2 x a week at bedtime 06/06/15  Yes Romualdo Bolk, MD  flecainide (TAMBOCOR) 100 MG tablet Take 100 mg by mouth 2 (two) times daily.   Yes Historical Provider, MD  gabapentin (NEURONTIN) 400 MG capsule Take 400 mg by mouth 2 (two) times daily.   Yes Historical Provider, MD  HYDROcodone-acetaminophen (NORCO/VICODIN) 5-325 MG per tablet Take 0.5 tablets by mouth 3 (three) times daily as needed. May fill 08/01/15 Patient taking differently: Take 0.5 tablets by mouth 3 (three) times daily as needed for moderate pain. May fill 08/01/15 06/01/15  Yes Shelva Majestic, MD  lamoTRIgine (LAMICTAL) 25 MG tablet Take 25 mg by mouth daily.   Yes Historical Provider, MD  LORazepam (ATIVAN) 1 MG tablet  Take 1 mg by mouth 3 (three) times daily as needed (disternia).    Yes Historical Provider, MD  lovastatin (MEVACOR) 20 MG tablet Take 20 mg by mouth at bedtime.   Yes Historical Provider, MD  lurasidone (LATUDA) 40 MG TABS tablet Take 60 mg by mouth daily with breakfast.   Yes Historical Provider, MD  mirtazapine (REMERON) 30 MG tablet Take 30 mg by mouth at bedtime.   Yes Historical Provider, MD  Multiple Vitamins-Minerals (MULTIVITAMIN GUMMIES ADULTS PO) Take 1 each by mouth 3 (three) times a week.    Yes Historical Provider, MD  omeprazole (PRILOSEC) 20 MG capsule Take 20 mg by mouth daily.   Yes Historical Provider, MD  rivaroxaban (XARELTO) 20 MG TABS tablet Take 1 tablet (20 mg total) by mouth daily with supper. 05/07/15  Yes Shelva Majestic, MD  zolpidem (AMBIEN) 10 MG tablet Take 10 mg by mouth at bedtime as needed for sleep.   Yes Historical Provider, MD  erythromycin ophthalmic ointment Place a 1/2 inch ribbon of ointment into the lower eyelid 4 times daily for 5 days 08/04/15   Eyvonne Mechanic, PA-C   BP 124/60 mmHg  Pulse 64  Temp(Src) 97.6 F (36.4 C) (Oral)  Resp 18  SpO2 100%   Physical Exam  Constitutional: She is oriented to person, place, and time. She appears well-developed and well-nourished.  HENT:  Head: Normocephalic and atraumatic.  Eyes: Conjunctivae are normal. Pupils are equal, round, and reactive to light. Right eye exhibits no discharge. Left eye exhibits no discharge. No scleral icterus.  Corneal abrasion to the right medial aspect of the cornea. Redness to the sclera of the medial aspect of the eye. Ocular movements intact, no discharge  Neck: Normal range of motion. No JVD present. No tracheal deviation present.  Pulmonary/Chest: Effort normal. No stridor.  Neurological: She is alert and oriented to person, place, and time. Coordination normal.  Psychiatric: She has a normal mood and affect. Her behavior is normal. Judgment and thought content normal.   Nursing note and vitals reviewed.  Vision 20/25 left, 20/25 right  ED Course  Procedures (including critical care time) Labs Review Labs Reviewed - No data to display  Imaging Review No results found. I have personally reviewed and evaluated these images and lab results as part of my medical decision-making.   EKG Interpretation None      MDM   Final diagnoses:  Corneal abrasion, left, initial encounter    Labs:  Imaging: Fluorescein, slit lamp  Consults: Sherryll Burger MD Optho  Therapeutics: tetracaine   Discharge Meds: Erythromycin  Assessment/Plan: Patient presents after foreign body in eye. Seen shows uptake, slit lamp showed no obvious foreign body. Gentle debridement with Q-tip seems to have dislodged any foreign body. Due to the indeterminate foreign body present ophthalmology was consult. They encouraged us to discharge patient home with erythromycin with follow-up in their office Monday morning at 8 AM. Patient has no significant changes in her vision, no painful ocular movements, no need for further evaluation and management here in the ED setting. She was discharged home with follow-up instructions, struck a curb cautions, she verbalized understanding and agreement for today's plan.         Eyvonne MechanicJeffrey Sigismund Cross, PA-C 08/04/15 1633  Leta BaptistEmily Roe Nguyen, MD 08/05/15 0900

## 2015-08-04 NOTE — Discharge Instructions (Signed)
Corneal Abrasion The cornea is the clear covering at the front and center of the eye. When looking at the colored portion of the eye (iris), you are looking through the cornea. This very thin tissue is made up of many layers. The surface layer is a single layer of cells (corneal epithelium) and is one of the most sensitive tissues in the body. If a scratch or injury causes the corneal epithelium to come off, it is called a corneal abrasion. If the injury extends to the tissues below the epithelium, the condition is called a corneal ulcer. CAUSES   Scratches.  Trauma.  Foreign body in the eye. Some people have recurrences of abrasions in the area of the original injury even after it has healed (recurrent erosion syndrome). Recurrent erosion syndrome generally improves and goes away with time. SYMPTOMS   Eye pain.  Difficulty or inability to keep the injured eye open.  The eye becomes very sensitive to light.  Recurrent erosions tend to happen suddenly, first thing in the morning, usually after waking up and opening the eye. DIAGNOSIS  Your health care provider can diagnose a corneal abrasion during an eye exam. Dye is usually placed in the eye using a drop or a small paper strip moistened by your tears. When the eye is examined with a special light, the abrasion shows up clearly because of the dye. TREATMENT   Small abrasions may be treated with antibiotic drops or ointment alone.  A pressure patch may be put over the eye. If this is done, follow your doctor's instructions for when to remove the patch. Do not drive or use machines while the eye patch is on. Judging distances is hard to do with a patch on. If the abrasion becomes infected and spreads to the deeper tissues of the cornea, a corneal ulcer can result. This is serious because it can cause corneal scarring. Corneal scars interfere with light passing through the cornea and cause a loss of vision in the involved eye. HOME CARE  INSTRUCTIONS  Use medicine or ointment as directed. Only take over-the-counter or prescription medicines for pain, discomfort, or fever as directed by your health care provider.  Do not drive or operate machinery if your eye is patched. Your ability to judge distances is impaired.  If your health care provider has given you a follow-up appointment, it is very important to keep that appointment. Not keeping the appointment could result in a severe eye infection or permanent loss of vision. If there is any problem keeping the appointment, let your health care provider know. SEEK MEDICAL CARE IF:   You have pain, light sensitivity, and a scratchy feeling in one eye or both eyes.  Your pressure patch keeps loosening up, and you can blink your eye under the patch after treatment.  Any kind of discharge develops from the eye after treatment or if the lids stick together in the morning.  You have the same symptoms in the morning as you did with the original abrasion days, weeks, or months after the abrasion healed.   This information is not intended to replace advice given to you by your health care provider. Make sure you discuss any questions you have with your health care provider.   Document Released: 09/05/2000 Document Revised: 05/30/2015 Document Reviewed: 05/16/2013 Elsevier Interactive Patient Education 2016 [employer].  Please follow-up with ophthalmologist on Monday at 8 AM for further evaluation and management. If new or worsening signs or symptoms present please return for  further evaluation.

## 2015-08-13 ENCOUNTER — Telehealth: Payer: Self-pay | Admitting: *Deleted

## 2015-08-13 NOTE — Telephone Encounter (Signed)
Ok to refill 

## 2015-08-13 NOTE — Telephone Encounter (Signed)
Refill request: Flecainide 100 mg Tab #180   Last seen: 06/01/15

## 2015-08-13 NOTE — Telephone Encounter (Signed)
Patient was referred to a fib clinic on 06/01/15. What is status of that? May refill for 60 days.

## 2015-08-14 NOTE — Telephone Encounter (Signed)
See below

## 2015-08-14 NOTE — Telephone Encounter (Signed)
Lm on pt vm tcb  

## 2015-08-14 NOTE — Telephone Encounter (Signed)
Danielle PoundDeborah can you check on the status of this referral please?

## 2015-08-14 NOTE — Telephone Encounter (Signed)
Per [facility]  [facility]  they contact this pt on 06-04-2015- [facility] to call there office to schedule and appointment  and on 06-08-2015 lm to call -pt did call back and said she was out of town - [facility] called back again to schedule pt stated she saw a  Development worker, international aidCardiologist [place] and she will call back in march to schedule - [facility] have this notes documented in their work queue

## 2015-08-14 NOTE — Telephone Encounter (Signed)
Please call Danielle Rocha and instruct her to reach out to cardiology to schedule visit- would encourage her to do that as I feel they need her expertise to continue to prescribe antiarrythmic medicine

## 2015-08-15 ENCOUNTER — Telehealth: Payer: Self-pay

## 2015-08-15 NOTE — Telephone Encounter (Signed)
Lm on pt vm with below message. 

## 2015-08-15 NOTE — Telephone Encounter (Signed)
See previous phone note. Pt called back and states cardiology is not going to see her until 11/2015 and she states she wants to have this medication refilled to hold her over until she sees cardiology.

## 2015-08-15 NOTE — Telephone Encounter (Signed)
That's fine- may refill until that time

## 2015-08-18 MED ORDER — FLECAINIDE ACETATE 100 MG PO TABS: 100.0000 mg | ORAL_TABLET | Freq: Two times a day (BID) | ORAL | Status: DC

## 2015-08-18 NOTE — Addendum Note (Signed)
Addended by: Lieutenant DiegoHINES, Mirabel Ahlgren A on: 08/18/2015 09:23 AM   Modules accepted: Orders

## 2015-08-18 NOTE — Telephone Encounter (Signed)
Medication refilled

## 2015-08-22 ENCOUNTER — Other Ambulatory Visit: Payer: Self-pay | Admitting: Family Medicine

## 2015-08-22 MED ORDER — OMEPRAZOLE 20 MG PO CPDR: 20.0000 mg | DELAYED_RELEASE_CAPSULE | Freq: Every day | ORAL | Status: DC

## 2015-08-31 ENCOUNTER — Ambulatory Visit (INDEPENDENT_AMBULATORY_CARE_PROVIDER_SITE_OTHER): Payer: BLUE CROSS/BLUE SHIELD | Admitting: Family Medicine

## 2015-08-31 ENCOUNTER — Encounter: Payer: Self-pay | Admitting: Family Medicine

## 2015-08-31 VITALS — BP 118/72 | Temp 98.5°F | Wt 160.0 lb

## 2015-08-31 DIAGNOSIS — I480 Paroxysmal atrial fibrillation: Secondary | ICD-10-CM

## 2015-08-31 DIAGNOSIS — G894 Chronic pain syndrome: Secondary | ICD-10-CM

## 2015-08-31 MED ORDER — OXYCODONE-ACETAMINOPHEN 5-325 MG PO TABS: 0.5000 | ORAL_TABLET | Freq: Two times a day (BID) | ORAL | Status: DC | PRN

## 2015-08-31 NOTE — Assessment & Plan Note (Signed)
S: vicodin 7.5 mg per day total was helping (1/2 tab of 5mg  TID) but pain level has increased some. Wants to trial percocet again. Focused mainly in bilateral hips but also bilateral low back. Still taking gabapentin A/P: we will go back to percocet 1 tablet daily primarily (she can take 1/2 tab BID). Did give extra tabs #4 for particularly tough days. This is max that will be prescribed.

## 2015-08-31 NOTE — Assessment & Plan Note (Addendum)
S: remains on Xarelto anticoagulation. Flecainide antiarrythmic. Remains in sinus.  A/P:patient has been referred to a fib clinic and sees them in march- I refilled flecainide until that time. Records are not 100% clear on flutter vs. Fibrillation.

## 2015-08-31 NOTE — Patient Instructions (Addendum)
Refilled pain medicine. We will trial the percocet again. See you in 3 months.   Sign release of information at the front desk for Colonoscopy report Madaline GuthrieEaston MD  Pretty pleaseeeee get your mammogram before we see you back  Have a Merry Christmas!

## 2015-08-31 NOTE — Progress Notes (Signed)
Tana ConchStephen Hunter, MD  Subjective:  Danielle Rocha is a 64 y.o. year old very pleasant female patient who presents for/with See problem oriented charting ROS- No chest pain or shortness of breath. No headache or blurry vision. Does have some knee pain mild to moderate saw Dr. Caryl NeverBurchette for. Eyes doing better now that glue was removed from artificial eyelash placement  Past Medical History-  Patient Active Problem List   Diagnosis Date Noted  . Fibromyalgia 02/06/2015    Priority: High  . Atrial fibrillation (HCC)     Priority: High  . Chronic pain syndrome     Priority: High  . Dystonia     Priority: High  . Colon polyp     Priority: High  . TIA (transient ischemic attack) 01/18/2015    Priority: Medium  . Hyperlipidemia     Priority: Medium  . Depression     Priority: Medium  . Goiter     Priority: Medium  . Vaginal atrophy     Priority: Low  . GERD (gastroesophageal reflux disease)     Priority: Low  . Arthritis     Priority: Low    Medications- reviewed and updated Current Outpatient Prescriptions  Medication Sig Dispense Refill  . Estradiol (VAGIFEM) 10 MCG TABS vaginal tablet Place 1 tablet vaginal qhs x 1 week, the use 1 tablet per vagina 2 x a week at bedtime (Patient taking differently: Use 1 tablet per vagina 2 x a week at bedtime) 24 tablet 4  . flecainide (TAMBOCOR) 100 MG tablet Take 1 tablet (100 mg total) by mouth 2 (two) times daily. 120 tablet 3  . gabapentin (NEURONTIN) 400 MG capsule Take 400 mg by mouth 2 (two) times daily.    Marland Kitchen. lamoTRIgine (LAMICTAL) 25 MG tablet Take 25 mg by mouth daily.    Marland Kitchen. lovastatin (MEVACOR) 20 MG tablet Take 20 mg by mouth at bedtime.    Marland Kitchen. lurasidone (LATUDA) 40 MG TABS tablet Take 60 mg by mouth daily with breakfast.    . mirtazapine (REMERON) 30 MG tablet Take 30 mg by mouth at bedtime.    . Multiple Vitamins-Minerals (MULTIVITAMIN GUMMIES ADULTS PO) Take 1 each by mouth 3 (three) times a week.     Marland Kitchen. omeprazole (PRILOSEC) 20  MG capsule Take 1 capsule (20 mg total) by mouth daily. 30 capsule 6  . rivaroxaban (XARELTO) 20 MG TABS tablet Take 1 tablet (20 mg total) by mouth daily with supper. 90 tablet 3  . LORazepam (ATIVAN) 1 MG tablet Take 1 mg by mouth 3 (three) times daily as needed (disternia).     Marland Kitchen. oxyCODONE-acetaminophen (PERCOCET/ROXICET) 5-325 MG tablet  At time of visit was on vicodin 2.5mg  TID Take 0.5-1 tablets by mouth 2 (two) times daily as needed for severe pain (may fill 11/01/15). 35 tablet 0  . zolpidem (AMBIEN) 10 MG tablet Take 10 mg by mouth at bedtime as needed for sleep.     No current facility-administered medications for this visit.    Objective: BP 118/72 mmHg  Temp(Src) 98.5 F (36.9 C)  Wt 160 lb (72.576 kg) Gen: NAD, resting comfortably CV: RRR no murmurs rubs or gallops Lungs: CTAB no crackles, wheeze, rhonchi Abdomen: soft/nontender/nondistended/normal bowel sounds. No rebound or guarding.  Ext: no edema Skin: warm, dry MSK: pain with even superficial touch of low back and bilateral hips Neuro: grossly normal, moves all extremities  Assessment/Plan:  Chronic pain syndrome S: vicodin 7.5 mg per day total was helping (1/2 tab of   TID) but pain level has increased some. Wants to trial percocet again. Focused mainly in bilateral hips but also bilateral low back. Still taking gabapentin A/P: we will go back to percocet 1 tablet daily primarily (she can take 1/2 tab BID). Did give extra tabs #4 for particularly tough days. This is max that will be prescribed.    Atrial fibrillation S: remains on Xarelto anticoagulation. Flecainide antiarrythmic. Remains in sinus.  A/P:patient has been referred to a fib clinic and sees them in march- I refilled flecainide until that time. Records are not 100% clear on flutter vs. Fibrillation.    3 months planned Return precautions advised.   Meds ordered this encounter  Medications  . DISCONTD: oxyCODONE-acetaminophen (PERCOCET/ROXICET)  5-325 MG tablet    Sig: Take 0.5-1 tablets by mouth 2 (two) times daily as needed for severe pain (may fill 08/31/15).    Dispense:  35 tablet    Refill:  0  . DISCONTD: oxyCODONE-acetaminophen (PERCOCET/ROXICET) 5-325 MG tablet    Sig: Take 0.5-1 tablets by mouth 2 (two) times daily as needed for severe pain (may fill 10/01/15).    Dispense:  35 tablet    Refill:  0  . oxyCODONE-acetaminophen (PERCOCET/ROXICET) 5-325 MG tablet    Sig: Take 0.5-1 tablets by mouth 2 (two) times daily as needed for severe pain (may fill 11/01/15).    Dispense:  35 tablet    Refill:  0    Health Maintenance Due  Topic Date Due  . MAMMOGRAM - strongly encouraged 04/24/2002  Colonoscopy report Madaline Guthrie MD- have patient request today

## 2015-09-02 ENCOUNTER — Emergency Department (HOSPITAL_COMMUNITY): Payer: BLUE CROSS/BLUE SHIELD

## 2015-09-02 ENCOUNTER — Encounter (HOSPITAL_COMMUNITY): Payer: Self-pay

## 2015-09-02 ENCOUNTER — Emergency Department (HOSPITAL_COMMUNITY)
Admission: EM | Admit: 2015-09-02 | Discharge: 2015-09-02 | Disposition: A | Discharge status: Home / Self Care | Payer: BLUE CROSS/BLUE SHIELD | Attending: Emergency Medicine | Admitting: Emergency Medicine

## 2015-09-02 DIAGNOSIS — M1990 Unspecified osteoarthritis, unspecified site: Secondary | ICD-10-CM | POA: Insufficient documentation

## 2015-09-02 DIAGNOSIS — Z7901 Long term (current) use of anticoagulants: Secondary | ICD-10-CM | POA: Diagnosis not present

## 2015-09-02 DIAGNOSIS — R079 Chest pain, unspecified: Secondary | ICD-10-CM | POA: Diagnosis present

## 2015-09-02 DIAGNOSIS — M797 Fibromyalgia: Secondary | ICD-10-CM | POA: Insufficient documentation

## 2015-09-02 DIAGNOSIS — E785 Hyperlipidemia, unspecified: Secondary | ICD-10-CM | POA: Diagnosis not present

## 2015-09-02 DIAGNOSIS — R5383 Other fatigue: Secondary | ICD-10-CM | POA: Insufficient documentation

## 2015-09-02 DIAGNOSIS — I4891 Unspecified atrial fibrillation: Secondary | ICD-10-CM | POA: Diagnosis not present

## 2015-09-02 DIAGNOSIS — Z8742 Personal history of other diseases of the female genital tract: Secondary | ICD-10-CM | POA: Insufficient documentation

## 2015-09-02 DIAGNOSIS — Z86010 Personal history of colonic polyps: Secondary | ICD-10-CM | POA: Insufficient documentation

## 2015-09-02 DIAGNOSIS — Z8619 Personal history of other infectious and parasitic diseases: Secondary | ICD-10-CM | POA: Diagnosis not present

## 2015-09-02 DIAGNOSIS — Z8673 Personal history of transient ischemic attack (TIA), and cerebral infarction without residual deficits: Secondary | ICD-10-CM | POA: Insufficient documentation

## 2015-09-02 DIAGNOSIS — I480 Paroxysmal atrial fibrillation: Secondary | ICD-10-CM | POA: Diagnosis not present

## 2015-09-02 DIAGNOSIS — K219 Gastro-esophageal reflux disease without esophagitis: Secondary | ICD-10-CM | POA: Diagnosis not present

## 2015-09-02 DIAGNOSIS — G894 Chronic pain syndrome: Secondary | ICD-10-CM | POA: Insufficient documentation

## 2015-09-02 HISTORY — DX: Cerebral infarction, unspecified: I63.9

## 2015-09-02 LAB — BASIC METABOLIC PANEL
Anion gap: 8 (ref 5–15)
BUN: 13 mg/dL (ref 6–20)
CHLORIDE: 108 mmol/L (ref 101–111)
CO2: 28 mmol/L (ref 22–32)
CREATININE: 0.95 mg/dL (ref 0.44–1.00)
Calcium: 9.8 mg/dL (ref 8.9–10.3)
Glucose, Bld: 92 mg/dL (ref 65–99)
POTASSIUM: 4.3 mmol/L (ref 3.5–5.1)
SODIUM: 144 mmol/L (ref 135–145)

## 2015-09-02 LAB — CBC
HEMATOCRIT: 42.4 % (ref 36.0–46.0)
HEMOGLOBIN: 14.3 g/dL (ref 12.0–15.0)
MCH: 27.8 pg (ref 26.0–34.0)
MCHC: 33.7 g/dL (ref 30.0–36.0)
MCV: 82.5 fL (ref 78.0–100.0)
Platelets: 251 10*3/uL (ref 150–400)
RBC: 5.14 MIL/uL — ABNORMAL HIGH (ref 3.87–5.11)
RDW: 12.7 % (ref 11.5–15.5)
WBC: 6 10*3/uL (ref 4.0–10.5)

## 2015-09-02 LAB — I-STAT TROPONIN, ED
TROPONIN I, POC: 0 ng/mL (ref 0.00–0.08)
Troponin i, poc: 0 ng/mL (ref 0.00–0.08)

## 2015-09-02 LAB — TSH: TSH: 0.367 u[IU]/mL (ref 0.350–4.500)

## 2015-09-02 NOTE — ED Provider Notes (Signed)
CSN: [id number]     Arrival date & time 09/02/15  1800 History   First MD Initiated Contact with Patient 09/02/15 1823     Chief Complaint  Patient presents with  . Chest Pain  . Fatigue     (Consider location/radiation/quality/duration/timing/severity/associated sxs/prior Treatment) HPI Comments: Patient is a 64 year old female with a history of paroxysmal A. fib status post ablation on flecanide and xarelto, hyperlipidemia and TIA who presents today with symptoms of palpitations intermittently all day yesterday which have resolved but the fatigue all day today. She states she's had intermittent left-sided chest discomfort that has lasted no more than 30 minutes which is not exertional or related to eating throughout today. She denies nausea, vomiting, abdominal pain, unilateral leg pain or swelling.  No recent medication changes and she has not missed any medications. She does not have a cardiologist here in town because [place] from [place] and is still waiting to follow-up. She has some borderline thyroid panels but takes no thyroid medication. She states prior to yesterday she felt her normal self. She has not had any recent illness and no other heart history such as CAD or CHF.  Patient is a 64 y.o. female presenting with chest pain. The history is provided by the patient.  Chest Pain   Past Medical History  Diagnosis Date  . Arthritis     DJD, low back, thumb  . Chicken pox   . Depression     zoloft 100mg , remeron 30mg  per psychiatry. ambien 10mg  per psychiatry to help wtih sleep element.   Marland Kitchen. GERD (gastroesophageal reflux disease)     omeprazole OTC  . Atrial fibrillation (HCC)     Xarelto anticoagulation. Flecainide antiarrythmic   . Hyperlipidemia     lovastatin 20mg   . Colon polyp     awaiting records  . Goiter     states multiple imaging tests, has had biopsies  . Dystonia     described as psychogenic dystonia. Pain and twisting from upper chest and up ith  triggers "wind, creamy food" on TID ativan per psychiatry previously.   . Vaginal atrophy     estrace vaginal cream  . Chronic pain syndrome     On disability. History of bilateral hip pain, low back pain. Gabapentin 400 BID, percocet 1 tablet daily per prior provider.   . Fibromyalgia   . TIA (transient ischemic attack)   . Stroke [facility](HCC)     TIA (left side of face and body decreased sensitivity than right face and side)   Past Surgical History  Procedure Laterality Date  . Piriformis release  1999  . Fusion l4-l5  2000  . Tonsillectomy and adenoidectomy  age 89  . Vaginal hysterectomy  1993  . Other surgical history      tennis elbow surgery  . Biopsy thyroid    . Laminectomy      L4-L5  . Atrial fibrillation ablation     Family History  Problem Relation Age of Onset  . Alcohol abuse Mother   . Hypertension Father   . Alcohol abuse Father   . Pancreatic cancer Father   . Cancer Brother     spindle cell RLE  . Cancer Brother     tonsil cancer  . Skin cancer Brother    Social History  Substance Use Topics  . Smoking status: Never Smoker   . Smokeless tobacco: Never Used  . Alcohol Use: No   OB History    Gravida Para Term  Preterm AB TAB SAB Ectopic Multiple Living   3 3 2 1      3      Review of Systems  Cardiovascular: Positive for chest pain.  All other systems reviewed and are negative.     Allergies  Fish allergy; Ciprofloxacin; Clindamycin/lincomycin; Reglan; Vibramycin; and Sulfa antibiotics  Home Medications   Prior to Admission medications   Medication Sig Start Date End Date Taking? Authorizing Provider  Estradiol (VAGIFEM) 10 MCG TABS vaginal tablet Place 1 tablet vaginal qhs x 1 week, the use 1 tablet per vagina 2 x a week at bedtime Patient taking differently: Use 1 tablet per vagina 2 x a week at bedtime 06/06/15   Romualdo Bolk, MD  flecainide (TAMBOCOR) 100 MG tablet Take 1 tablet (100 mg total) by mouth 2 (two) times daily. 08/18/15    Shelva Majestic, MD  gabapentin (NEURONTIN) 400 MG capsule Take 400 mg by mouth 2 (two) times daily.    Historical Provider, MD  lamoTRIgine (LAMICTAL) 25 MG tablet Take 25 mg by mouth daily.    Historical Provider, MD  LORazepam (ATIVAN) 1 MG tablet Take 1 mg by mouth 3 (three) times daily as needed (disternia).     Historical Provider, MD  lovastatin (MEVACOR) 20 MG tablet Take 20 mg by mouth at bedtime.    Historical Provider, MD  lurasidone (LATUDA) 40 MG TABS tablet Take 60 mg by mouth daily with breakfast.    Historical Provider, MD  mirtazapine (REMERON) 30 MG tablet Take 30 mg by mouth at bedtime.    Historical Provider, MD  Multiple Vitamins-Minerals (MULTIVITAMIN GUMMIES ADULTS PO) Take 1 each by mouth 3 (three) times a week.     Historical Provider, MD  omeprazole (PRILOSEC) 20 MG capsule Take 1 capsule (20 mg total) by mouth daily. 08/22/15   Shelva Majestic, MD  oxyCODONE-acetaminophen (PERCOCET/ROXICET) 5-325 MG tablet Take 0.5-1 tablets by mouth 2 (two) times daily as needed for severe pain (may fill 11/01/15). 08/31/15   Shelva Majestic, MD  rivaroxaban (XARELTO) 20 MG TABS tablet Take 1 tablet (20 mg total) by mouth daily with supper. 05/07/15   Shelva Majestic, MD  zolpidem (AMBIEN) 10 MG tablet Take 10 mg by mouth at bedtime as needed for sleep.    Historical Provider, MD   BP 109/80 mmHg  Pulse 76  Temp(Src) 98.2 F (36.8 C) (Oral)  Resp 25  Ht 5\' 8"  (1.727 m)  Wt 160 lb (72.576 kg)  BMI 24.33 kg/m2  SpO2 100% Physical Exam  Constitutional: She is oriented to person, place, and time. She appears well-developed and well-nourished. No distress.  HENT:  Head: Normocephalic and atraumatic.  Mouth/Throat: Oropharynx is clear and moist.  Eyes: Conjunctivae and EOM are normal. Pupils are equal, round, and reactive to light.  Neck: Normal range of motion. Neck supple.  Cardiovascular: Normal rate, regular rhythm and intact distal pulses.   No murmur heard. Pulmonary/Chest:  Effort normal and breath sounds normal. No respiratory distress. She has no wheezes. She has no rales.  Abdominal: Soft. She exhibits no distension. There is no tenderness. There is no rebound and no guarding.  Musculoskeletal: Normal range of motion. She exhibits no edema or tenderness.  No pain or swelling  Neurological: She is alert and oriented to person, place, and time.  Skin: Skin is warm and dry. No rash noted. No erythema.  Psychiatric: She has a normal mood and affect. Her behavior is normal.  Nursing note and  vitals reviewed.   ED Course  Procedures (including critical care time) Labs Review Labs Reviewed  CBC - Abnormal; Notable for the following:    RBC 5.14 (*)    All other components within normal limits  BASIC METABOLIC PANEL  TSH  I-STAT TROPOININ, ED  Rosezena Sensor, ED    Imaging Review Dg Chest 2 View  09/02/2015  CLINICAL DATA:  Chest pain and tachycardia. EXAM: CHEST  2 VIEW COMPARISON:  None. FINDINGS: Normal heart size and pulmonary vascularity. Peribronchial thickening with central interstitial markings suggesting chronic bronchitis. No focal consolidation in the lungs. Vague nodular opacity in the right apex measuring 13 mm maximal diameter. CT chest suggested to exclude significant pulmonary nodule. No blunting of costophrenic angles. No pneumothorax. Mediastinal contours appear intact. IMPRESSION: Central interstitial changes suggesting bronchitis. Nodular opacity in the right apex. CT chest suggested. Electronically Signed   By: Burman Nieves M.D.   On: 09/02/2015 19:03   Ct Chest Wo Contrast  09/02/2015  CLINICAL DATA:  Chest pain.  Possible pulmonary nodule. EXAM: CT CHEST WITHOUT CONTRAST TECHNIQUE: Multidetector CT imaging of the chest was performed following the standard protocol without IV contrast. COMPARISON:  Same day. FINDINGS: No pneumothorax or pleural effusion is noted. Mild biapical scarring is noted. 4 mm left apical nodule is noted best  seen on image number 11 of series 3. No mediastinal mass or adenopathy is seen on these unenhanced images. There is no evidence of thoracic aortic aneurysm. Visualized portion of upper abdomen is unremarkable. No significant osseous abnormality is noted. IMPRESSION: Mild biapical scarring is noted. 4 mm left apical nodule is noted. If the patient is at high risk for bronchogenic carcinoma, follow-up chest CT at 1 year is recommended. If the patient is at low risk, no follow-up is needed. This recommendation follows the consensus statement: Guidelines for Management of Small Pulmonary Nodules Detected on CT Scans: A Statement from the Fleischner Society as published in Radiology 2005; 237:395-400. Electronically Signed   By: Lupita Raider, M.D.   On: 09/02/2015 20:13   I have personally reviewed and evaluated these images and lab results as part of my medical decision-making.   EKG Interpretation   Date/Time:  Sunday September 02 2015 18:05:24 EST Ventricular Rate:  71 PR Interval:  196 QRS Duration: 94 QT Interval:  414 QTC Calculation: 449 R Axis:   76 Text Interpretation:  Normal sinus rhythm Normal ECG No previous tracing  Confirmed by Anitra Lauth  MD, Alphonzo Lemmings ([id number]) on 09/02/2015 6:20:09 PM      MDM   Final diagnoses:  None    Patient is a 34 rolled female who has a history of paroxysmal atrial fibrillation status post ablation on flecanide and xarelto who had palpitations yesterday most suggestive of paroxysmal A. fib which of resolved today. This is the first time she's had palpitations since her ablation 5 years ago.  Today she has not had any further palpitations but has had generalized fatigue and several bouts of chest discomfort in the substernal area into the left lower rib cage. It does not produce shortness of breath is not related to exertion or eating. It resolved on its own and she currently is not having any.   Denies any recent URI symptoms or new medications. No  over-the-counter medications.  EKG is within normal limits. No evidence of A. fib. Initial troponin, BMP, CBC are all within normal limits. Chest x-ray shows a pulmonary nodule. When speaking with the patient she's never been  told she had a pulmonary nodule and she has a strong family history of lung cancer so a CT without contrast was ordered for further evaluation. TSH and repeat troponin are pending. Symptoms patient is having today could be related to having A. fib yesterday. Low suspicion that this is a thyroid problem, dissection, PE. She has no infectious symptoms concerning for a pneumonia.  9:29 PM CT showing a pulmonary nodule that patient is low risk for bronchogenic carcinoma no further follow-up needed. TSH within normal limits and repeat troponin is negative. Patient has a heart score of 2 making her very low risk that this is an acute cardiac event. Patient given follow-up with cardiology and encouraged return if symptoms worsen. She and her family are comfortable with the plan.  Gwyneth Sprout, MD 09/02/15 2130

## 2015-09-02 NOTE — ED Notes (Addendum)
Onset today chest pain, intermittant, lasts 30 min, frequency q 1-2 hours, pain is decreasing now. Onset yesterday "irregular heart rate", lightheadness, fatigue.  NAD in triage, talking in complete sentences.  No shortness of breath.

## 2015-09-02 NOTE — Discharge Instructions (Signed)
Atrial Fibrillation °Atrial fibrillation is a type of heartbeat that is irregular or fast (rapid). If you have this condition, your heart keeps quivering in a weird (chaotic) way. This condition can make it so your heart cannot pump blood normally. Having this condition gives a person more risk for stroke, heart failure, and other heart problems. There are different types of atrial fibrillation. Talk with your doctor to learn about the type that you have. °HOME CARE °· Take over-the-counter and prescription medicines only as told by your doctor. °· If your doctor prescribed a blood-thinning medicine, take it exactly as told. Taking too much of it can cause bleeding. If you do not take enough of it, you will not have the protection that you need against stroke and other problems. °· Do not use any tobacco products. These include cigarettes, chewing tobacco, and e-cigarettes. If you need help quitting, ask your doctor. °· If you have apnea (obstructive sleep apnea), manage it as told by your doctor. °· Do not drink alcohol. °· Do not drink beverages that have caffeine. These include coffee, soda, and tea. °· Maintain a healthy weight. Do not use diet pills unless your doctor says they are safe for you. Diet pills may make heart problems worse. °· Follow diet instructions as told by your doctor. °· Exercise regularly as told by your doctor. °· Keep all follow-up visits as told by your doctor. This is important. °GET HELP IF: °· You notice a change in the speed, rhythm, or strength of your heartbeat. °· You are taking a blood-thinning medicine and you notice more bruising. °· You get tired more easily when you move or exercise. °GET HELP RIGHT AWAY IF: °· You have pain in your chest or your belly (abdomen). °· You have sweating or weakness. °· You feel sick to your stomach (nauseous). °· You notice blood in your throw up (vomit), poop (stool), or pee (urine). °· You are short of breath. °· You suddenly have swollen feet  and ankles. °· You feel dizzy. °· Your suddenly get weak or numb in your face, arms, or legs, especially if it happens on one side of your body. °· You have trouble talking, trouble understanding, or both. °· Your face or your eyelid droops on one side. °These symptoms may be an emergency. Do not wait to see if the symptoms will go away. Get medical help right away. Call your local emergency services (911 in the [place] Do not drive yourself to the hospital. °  °This information is not intended to replace advice given to you by your health care provider. Make sure you discuss any questions you have with your health care provider. °  °Document Released: 06/17/2008 Document Revised: 05/30/2015 Document Reviewed: 01/03/2015 °Elsevier Interactive Patient Education ©2016 [employer] Inc[employer] ° °

## 2015-09-03 ENCOUNTER — Telehealth: Payer: Self-pay | Admitting: *Deleted

## 2015-09-03 NOTE — Telephone Encounter (Signed)
Patient checked into ED on 09/02/15   ------------------------------------------------------------------------------------------------------------------------------------------------------------------------------------------------- PLEASE NOTE: All timestamps contained within this report are represented as [place] Standard Time. CONFIDENTIALTY NOTICE: This fax transmission is intended only for the addressee. It contains information that is legally privileged, confidential or otherwise protected from use or disclosure. If you are not the intended recipient, you are strictly prohibited from reviewing, disclosing, copying using or disseminating any of this information or taking any action in reliance on or regarding this information. If you have received this fax in error, please notify us immediately by telephone so that we can arrange for its return to [place]. Phone: [phone], Toll-Free: [phone], Fax: [phone] Page: 1 of 2 Call Id: [id number] [facility] - Client TELEPHONE ADVICE RECORD [facility] Patient Name: Danielle Rocha Gender: Female DOB: November 28, 1950 Age: 64 Y 4 M 8 D Return Phone Number: [phone] (Primary), [phone] (Secondary) Address: City/State/Zip: [place] Client [facility] - Client Client Site  [facility] Physician Tana Conch Contact Type Call Call Type Triage / Clinical Caller Name Braylon Lemmons Relationship To Patient Self Return Phone Number [phone] (Primary) Chief Complaint Heart palpitations or irregular heartbeat Initial Comment Caller states I am having irregular heartbeat light headed and extremely tired PreDisposition Call Doctor Nurse Assessment Nurse: Andi Devon, RN, Fleet Contras Date/Time Lamount Cohen Time): 09/02/2015 5:18:45 PM Confirm and document reason for call. If symptomatic, describe symptoms. ---Caller says on Friday evening & yesterday she  had some irregular heart rates. Today she feels like she is dizzy/light headed & she is more tired; however, her pulse feels regular now. Has the patient traveled out of the country within the last 30 days? ---Not Applicable Does the patient have any new or worsening symptoms? ---Yes Will a triage be completed? ---Yes Related visit to physician within the last 2 weeks? ---No Does the PT have any chronic conditions? (i.e. diabetes, asthma, etc.) ---Yes List chronic conditions. ---stroke; pain; dystonia Is this a behavioral health or substance abuse call? ---No Guidelines Guideline Title Affirmed Question Affirmed Notes Nurse Date/Time (Eastern Time) Dizziness - Lightheadedness Extra heart beats OR irregular heart beating (i.e., "palpitations") Andi Devon, RN, Fleet Contras 09/02/2015 5:20:12 PM Disp. Time Lamount Cohen Time) Disposition Final User 09/02/2015 5:25:58 PM Go to ED Now (or PCP triage) Yes Andi Devon, RN, Abbott Pao Understands: Yes Disagree/Comply: Comply PLEASE NOTE: All timestamps contained within this report are represented as [place] Standard Time. CONFIDENTIALTY NOTICE: This fax transmission is intended only for the addressee. It contains information that is legally privileged, confidential or otherwise protected from use or disclosure. If you are not the intended recipient, you are strictly prohibited from reviewing, disclosing, copying using or disseminating any of this information or taking any action in reliance on or regarding this information. If you have received this fax in error, please notify us immediately by telephone so that we can arrange for its return to [place]. Phone: [phone], Toll-Free: [phone], Fax: [phone] Page: 2 of 2 Call Id: [id number] Care Advice Given Per Guideline GO TO ED NOW (OR PCP TRIAGE): DRIVING: Another adult should drive. BRING MEDICINES: * Please bring a list of your current medicines when you go to the Emergency Department (ER). * It  is also a good idea to bring the pill bottles too. This will help the doctor to make certain you are taking the right medicines and the right dose. CARE ADVICE given per Dizziness (Adult) guideline. * IF NO PCP TRIAGE: You need to be seen. Go to the  ER/UCC at _____________ Hospital within the next hour. Leave as soon as you can. After Care Instructions Given Call Event Type User Date / Time Description Referrals [facility]

## 2015-09-06 ENCOUNTER — Telehealth: Payer: Self-pay | Admitting: Family Medicine

## 2015-09-06 MED ORDER — OMEPRAZOLE 20 MG PO CPDR: 20.0000 mg | DELAYED_RELEASE_CAPSULE | Freq: Two times a day (BID) | ORAL | Status: DC

## 2015-09-06 NOTE — Telephone Encounter (Signed)
Pt call to say that she was taking 2 a day of the following med omeprazole (PRILOSEC) 20 MG capsule  She said she been taking 2 a day because and did not realize that the rx was for 1 a day .  She is requesting that a rx be wrriten for 2 a day    [facility]   [address]

## 2015-09-06 NOTE — Telephone Encounter (Signed)
rx sent in with instructions for BID.

## 2015-10-02 ENCOUNTER — Other Ambulatory Visit: Payer: Self-pay | Admitting: Family Medicine

## 2015-10-02 MED ORDER — HYDROCODONE-ACETAMINOPHEN 5-325 MG PO TABS: 1.0000 | ORAL_TABLET | Freq: Three times a day (TID) | ORAL | Status: DC | PRN

## 2015-10-02 MED ORDER — HYDROCODONE-ACETAMINOPHEN 5-325 MG PO TABS: 0.5000 | ORAL_TABLET | Freq: Three times a day (TID) | ORAL | Status: DC | PRN

## 2015-10-02 NOTE — Progress Notes (Signed)
Patient brings by 2 rx for percocet #35. These were shredded after being ripped up 4x.   Wants to return to vicodin as found more effective. New rx for vicodin was written with a refill provided. Will still follow up in 2 months as planned. [facility] Patient will have to come by to pick up.

## 2015-10-03 NOTE — Progress Notes (Signed)
Pt notified Rx up front for pick up.

## 2015-10-05 ENCOUNTER — Encounter: Payer: Self-pay | Admitting: Internal Medicine

## 2015-10-05 ENCOUNTER — Ambulatory Visit (INDEPENDENT_AMBULATORY_CARE_PROVIDER_SITE_OTHER): Payer: BLUE CROSS/BLUE SHIELD | Admitting: Internal Medicine

## 2015-10-05 VITALS — BP 118/58 | HR 75 | Ht 68.0 in | Wt 161.0 lb

## 2015-10-05 DIAGNOSIS — I4891 Unspecified atrial fibrillation: Secondary | ICD-10-CM

## 2015-10-05 NOTE — Patient Instructions (Signed)
Your physician recommends that you continue on your current medications as directed. Please refer to the Current Medication list given to you today. Follow up with your physician will be determined after Dr. Tenny Crawoss reviews previous records.

## 2015-10-05 NOTE — Progress Notes (Addendum)
Cardiology Office Note   Date:  10/05/2015   ID:  Danielle DoorChristy Fringer, DOB 08/30/1951, MRN [id number]  PCP:  Tana ConchStephen Hunter, MD  Cardiologist:   Dietrich PatesPaula Ross, MD    Pt presents for care of PAF  Referred by Dr Durene CalHunter     History of Present Illness: Danielle Rocha is a 65 y.o. female with a history of atrial fib  It began several years ago  [place]  Episodes were incapacitating  Tired  Extremely  Had ablation 3 to 5 years   Dr Hulda Marinemo did ablation     SInce ablation.  First time ever was 12th of December   Went to ER  Had converted prior to visit   She has not had any spells sinces     Denies CP  No SOB  Hip and back hurt and limit  Current Outpatient Prescriptions  Medication Sig Dispense Refill  . Estradiol (VAGIFEM) 10 MCG TABS vaginal tablet Place 1 tablet vaginal qhs x 1 week, the use 1 tablet per vagina 2 x a week at bedtime (Patient taking differently: Place 1 tablet vaginally once a week. ) 24 tablet 4  . flecainide (TAMBOCOR) 100 MG tablet Take 1 tablet (100 mg total) by mouth 2 (two) times daily. 120 tablet 3  . gabapentin (NEURONTIN) 400 MG capsule Take 400 mg by mouth 2 (two) times daily.    Marland Kitchen. HYDROcodone-acetaminophen (NORCO/VICODIN) 5-325 MG tablet Take 0.5 tablets by mouth 3 (three) times daily as needed. May fill 10/03/15 46 tablet 0  . lamoTRIgine (LAMICTAL) 25 MG tablet Take 25 mg by mouth at bedtime.     Marland Kitchen. LORazepam (ATIVAN) 1 MG tablet Take 1 mg by mouth 3 (three) times daily as needed (for dystonia reactions).     . lovastatin (MEVACOR) 20 MG tablet Take 20 mg by mouth at bedtime.    . Lurasidone HCl (LATUDA) 60 MG TABS Take 60 mg by mouth at bedtime.    . mirtazapine (REMERON) 30 MG tablet Take 30 mg by mouth at bedtime.    . Multiple Vitamins-Minerals (MULTIVITAMIN GUMMIES ADULTS PO) Take 1 each by mouth 3 (three) times a week.     Marland Kitchen. omeprazole (PRILOSEC) 20 MG capsule Take 1 capsule (20 mg total) by mouth 2 (two) times daily before a meal.  60 capsule 6  . rivaroxaban (XARELTO) 20 MG TABS tablet Take 1 tablet (20 mg total) by mouth daily with supper. 90 tablet 3  . zolpidem (AMBIEN) 10 MG tablet Take 10 mg by mouth at bedtime.      No current facility-administered medications for this visit.    Allergies:   Fish allergy; Reglan; Ciprofloxacin; Clindamycin/lincomycin; Vibramycin; and Sulfa antibiotics   Past Medical History  Diagnosis Date  . Arthritis     DJD, low back, thumb  . Chicken pox   . Depression     zoloft 100mg , remeron 30mg  per psychiatry. ambien 10mg  per psychiatry to help wtih sleep element.   Marland Kitchen. GERD (gastroesophageal reflux disease)     omeprazole OTC  . Atrial fibrillation (HCC)     Xarelto anticoagulation. Flecainide antiarrythmic   . Hyperlipidemia     lovastatin 20mg   . Colon polyp     awaiting records  . Goiter     states multiple imaging tests, has had biopsies  . Dystonia     described as psychogenic dystonia. Pain and twisting from upper chest and up ith triggers "wind, creamy food" on TID ativan per  psychiatry previously.   . Vaginal atrophy     estrace vaginal cream  . Chronic pain syndrome     On disability. History of bilateral hip pain, low back pain. Gabapentin 400 BID, percocet 1 tablet daily per prior provider.   . Fibromyalgia   . TIA (transient ischemic attack)   . Stroke [facility])     TIA (left side of face and body decreased sensitivity than right face and side)    Past Surgical History  Procedure Laterality Date  . Piriformis release  1999  . Fusion l4-l5  2000  . Tonsillectomy and adenoidectomy  age 53  . Vaginal hysterectomy  1993  . Other surgical history      tennis elbow surgery  . Biopsy thyroid    . Laminectomy      L4-L5  . Atrial fibrillation ablation       Social History:  The patient  reports that she has never smoked. She has never used smokeless tobacco. She reports that she does not drink alcohol or use illicit drugs.   Family History:  The patient's  family history includes Alcohol abuse in her father and mother; Cancer in her brother and brother; Hypertension in her father; Pancreatic cancer in her father; Skin cancer in her brother.    ROS:  Please see the history of present illness. All other systems are reviewed and  Negative to the above problem except as noted.    PHYSICAL EXAM: VS:  BP 118/58 mmHg  Pulse 75  Ht 5\' 8"  (1.727 m)  Wt 73.029 kg (161 lb)  BMI 24.49 kg/m2  GEN: Well nourished, well developed, in no acute distress HEENT: normal Neck: no JVD, carotid bruits, or masses Cardiac: RRR; no murmurs, rubs, or gallops,no edema  Respiratory:  clear to auscultation bilaterally, normal work of breathing GI: soft, nontender, nondistended, + BS  No hepatomegaly  MS: no deformity Moving all extremities   Skin: warm and dry, no rash Neuro:  Strength and sensation are intact Psych: euthymic mood, full affect   EKG:  EKG is ordered today.  SR 68 bpm  First degree AVblock  PR 206 msec  incomp RBBB  Nonspecific ST changess   Lipid Panel No results found for: CHOL, TRIG, HDL, CHOLHDL, VLDL, LDLCALC, LDLDIRECT    Wt Readings from Last 3 Encounters:  10/05/15 73.029 kg (161 lb)  09/02/15 72.576 kg (160 lb)  08/31/15 72.576 kg (160 lb)      ASSESSMENT AND PLAN:  1  PAF  Doing well with only one symptomatic spell  On flecanide and xarelto Will check labs  Get records  I would not recomm switch meds now.  Follow Stay active   Will get recores from [place]   Otherwise f/u in fall.      Signed, Dietrich Pates, MD  10/05/2015 12:23 PM    [facility] [address] [place], [place], [place]  [address] Phone: [phone]; Fax: [phone]    Addendum  Outside records from [place]  Pt s/p flutter ablation in August 2013 Dr b. Hulda Marin.   Abnormal EKG  T wave inversion V1-V6, I, AVL   Flecanide Rx   ? TIA in past mutinodular goiter ANA positive  I INtermitt L sided numbness.  Cause  unknown Hyperlipidemia Stress cardiolite  August 2013  Peak HR 130  BP 168/    Dietrich Pates 10/16/2015

## 2015-10-08 ENCOUNTER — Telehealth: Payer: Self-pay | Admitting: Internal Medicine

## 2015-10-08 ENCOUNTER — Telehealth: Payer: Self-pay

## 2015-10-08 NOTE — Telephone Encounter (Signed)
[facility]

## 2015-10-08 NOTE — Telephone Encounter (Signed)
[facility]

## 2015-10-10 ENCOUNTER — Telehealth: Payer: Self-pay | Admitting: Family Medicine

## 2015-10-10 ENCOUNTER — Other Ambulatory Visit: Payer: Self-pay

## 2015-10-10 MED ORDER — GABAPENTIN 800 MG PO TABS: 800.0000 mg | ORAL_TABLET | Freq: Two times a day (BID) | ORAL | Status: DC

## 2015-10-10 NOTE — Telephone Encounter (Signed)
Medication refilled

## 2015-10-10 NOTE — Telephone Encounter (Signed)
See below, Ok to change dosage? Current dosage is  twice daily.

## 2015-10-10 NOTE — Telephone Encounter (Signed)
Yes thanks, ok to fill as 800 mg BID

## 2015-10-10 NOTE — Telephone Encounter (Signed)
Patient would like her Gabapentin prescription refilled at the [facility] on [address]  Also the dosage should be  twice a day.

## 2015-10-15 ENCOUNTER — Telehealth: Payer: Self-pay | Admitting: Family Medicine

## 2015-10-15 ENCOUNTER — Other Ambulatory Visit: Payer: Self-pay | Admitting: Family Medicine

## 2015-10-15 MED ORDER — CYCLOBENZAPRINE HCL 5 MG PO TABS
5.0000 mg | ORAL_TABLET | Freq: Three times a day (TID) | ORAL | Status: DC | PRN
Start: 2015-10-15 — End: 2015-11-27

## 2015-10-15 NOTE — Telephone Encounter (Signed)
See below

## 2015-10-15 NOTE — Telephone Encounter (Signed)
Pt.notified

## 2015-10-15 NOTE — Telephone Encounter (Signed)
Pt states she is having left lower back spasms.  Has tried heat, tylenol. Wants to know if gr Durene Cal will prescribe  A muscle relaxer. Advised pt she may need appt, but she insisted I ask. [facility]

## 2015-10-15 NOTE — Telephone Encounter (Signed)
Sent in- avoid driving for 8 hours after taking

## 2015-10-31 ENCOUNTER — Ambulatory Visit
Admission: RE | Admit: 2015-10-31 | Discharge: 2015-10-31 | Disposition: A | Discharge status: Home / Self Care | Payer: BLUE CROSS/BLUE SHIELD | Source: Ambulatory Visit | Attending: Family Medicine | Admitting: Family Medicine

## 2015-10-31 DIAGNOSIS — Z780 Asymptomatic menopausal state: Secondary | ICD-10-CM

## 2015-10-31 DIAGNOSIS — Z1239 Encounter for other screening for malignant neoplasm of breast: Secondary | ICD-10-CM

## 2015-11-22 ENCOUNTER — Emergency Department (HOSPITAL_COMMUNITY): Payer: BLUE CROSS/BLUE SHIELD

## 2015-11-22 ENCOUNTER — Encounter (HOSPITAL_COMMUNITY): Payer: Self-pay

## 2015-11-22 ENCOUNTER — Observation Stay (HOSPITAL_COMMUNITY)
Admission: EM | Admit: 2015-11-22 | Discharge: 2015-11-24 | Disposition: A | Discharge status: Home / Self Care | Payer: BLUE CROSS/BLUE SHIELD | Attending: Internal Medicine | Admitting: Internal Medicine

## 2015-11-22 DIAGNOSIS — R4781 Slurred speech: Secondary | ICD-10-CM | POA: Insufficient documentation

## 2015-11-22 DIAGNOSIS — Z79890 Hormone replacement therapy (postmenopausal): Secondary | ICD-10-CM | POA: Insufficient documentation

## 2015-11-22 DIAGNOSIS — R45851 Suicidal ideations: Secondary | ICD-10-CM

## 2015-11-22 DIAGNOSIS — I482 Chronic atrial fibrillation: Secondary | ICD-10-CM | POA: Diagnosis not present

## 2015-11-22 DIAGNOSIS — T50902A Poisoning by unspecified drugs, medicaments and biological substances, intentional self-harm, initial encounter: Secondary | ICD-10-CM | POA: Diagnosis not present

## 2015-11-22 DIAGNOSIS — E785 Hyperlipidemia, unspecified: Secondary | ICD-10-CM | POA: Diagnosis not present

## 2015-11-22 DIAGNOSIS — Z7901 Long term (current) use of anticoagulants: Secondary | ICD-10-CM | POA: Insufficient documentation

## 2015-11-22 DIAGNOSIS — F32A Depression, unspecified: Secondary | ICD-10-CM | POA: Diagnosis present

## 2015-11-22 DIAGNOSIS — T1491 Suicide attempt: Secondary | ICD-10-CM | POA: Diagnosis not present

## 2015-11-22 DIAGNOSIS — J9811 Atelectasis: Secondary | ICD-10-CM | POA: Diagnosis not present

## 2015-11-22 DIAGNOSIS — Z8673 Personal history of transient ischemic attack (TIA), and cerebral infarction without residual deficits: Secondary | ICD-10-CM | POA: Insufficient documentation

## 2015-11-22 DIAGNOSIS — G894 Chronic pain syndrome: Secondary | ICD-10-CM | POA: Insufficient documentation

## 2015-11-22 DIAGNOSIS — T391X2A Poisoning by 4-Aminophenol derivatives, intentional self-harm, initial encounter: Secondary | ICD-10-CM | POA: Diagnosis present

## 2015-11-22 DIAGNOSIS — N179 Acute kidney failure, unspecified: Secondary | ICD-10-CM | POA: Diagnosis not present

## 2015-11-22 DIAGNOSIS — M797 Fibromyalgia: Secondary | ICD-10-CM | POA: Insufficient documentation

## 2015-11-22 DIAGNOSIS — S199XXA Unspecified injury of neck, initial encounter: Secondary | ICD-10-CM | POA: Diagnosis not present

## 2015-11-22 DIAGNOSIS — F419 Anxiety disorder, unspecified: Secondary | ICD-10-CM | POA: Insufficient documentation

## 2015-11-22 DIAGNOSIS — R4589 Other symptoms and signs involving emotional state: Secondary | ICD-10-CM

## 2015-11-22 DIAGNOSIS — K219 Gastro-esophageal reflux disease without esophagitis: Secondary | ICD-10-CM | POA: Diagnosis not present

## 2015-11-22 DIAGNOSIS — F329 Major depressive disorder, single episode, unspecified: Secondary | ICD-10-CM | POA: Diagnosis not present

## 2015-11-22 DIAGNOSIS — Z9189 Other specified personal risk factors, not elsewhere classified: Secondary | ICD-10-CM | POA: Diagnosis present

## 2015-11-22 DIAGNOSIS — M25461 Effusion, right knee: Secondary | ICD-10-CM

## 2015-11-22 DIAGNOSIS — Z79899 Other long term (current) drug therapy: Secondary | ICD-10-CM | POA: Insufficient documentation

## 2015-11-22 DIAGNOSIS — M25561 Pain in right knee: Secondary | ICD-10-CM | POA: Diagnosis not present

## 2015-11-22 DIAGNOSIS — I4891 Unspecified atrial fibrillation: Secondary | ICD-10-CM | POA: Diagnosis present

## 2015-11-22 DIAGNOSIS — S0990XA Unspecified injury of head, initial encounter: Secondary | ICD-10-CM | POA: Diagnosis not present

## 2015-11-22 DIAGNOSIS — R4689 Other symptoms and signs involving appearance and behavior: Secondary | ICD-10-CM

## 2015-11-22 LAB — PROTIME-INR
INR: 1.53 — ABNORMAL HIGH (ref 0.00–1.49)
Prothrombin Time: 18.5 seconds — ABNORMAL HIGH (ref 11.6–15.2)

## 2015-11-22 LAB — URINE MICROSCOPIC-ADD ON

## 2015-11-22 LAB — COMPREHENSIVE METABOLIC PANEL
ALT: 13 U/L — ABNORMAL LOW (ref 14–54)
AST: 18 U/L (ref 15–41)
Albumin: 4.2 g/dL (ref 3.5–5.0)
Alkaline Phosphatase: 66 U/L (ref 38–126)
Anion gap: 10 (ref 5–15)
BUN: 19 mg/dL (ref 6–20)
CO2: 25 mmol/L (ref 22–32)
Calcium: 9.1 mg/dL (ref 8.9–10.3)
Chloride: 106 mmol/L (ref 101–111)
Creatinine, Ser: 1.08 mg/dL — ABNORMAL HIGH (ref 0.44–1.00)
GFR calc Af Amer: >60 mL/min (ref >60)
GFR calc non Af Amer: 53 mL/min — ABNORMAL LOW (ref >60)
Glucose, Bld: 119 mg/dL — ABNORMAL HIGH (ref 65–99)
Potassium: 3.8 mmol/L (ref 3.5–5.1)
Sodium: 141 mmol/L (ref 135–145)
Total Bilirubin: 0.6 mg/dL (ref 0.3–1.2)
Total Protein: 7.1 g/dL (ref 6.5–8.1)

## 2015-11-22 LAB — URINALYSIS, ROUTINE W REFLEX MICROSCOPIC
BILIRUBIN URINE: NEGATIVE
Glucose, UA: NEGATIVE mg/dL
KETONES UR: NEGATIVE mg/dL
NITRITE: POSITIVE — AB
PROTEIN: NEGATIVE mg/dL
SPECIFIC GRAVITY, URINE: 1.039 — AB (ref 1.005–1.030)
pH: 5 (ref 5.0–8.0)

## 2015-11-22 LAB — CBC WITH DIFFERENTIAL/PLATELET
Basophils Absolute: 0 10*3/uL (ref 0.0–0.1)
Basophils Relative: 0 %
Eosinophils Absolute: 0 10*3/uL (ref 0.0–0.7)
Eosinophils Relative: 0 %
HEMATOCRIT: 41.6 % (ref 36.0–46.0)
HEMOGLOBIN: 13.9 g/dL (ref 12.0–15.0)
LYMPHS ABS: 1.1 10*3/uL (ref 0.7–4.0)
Lymphocytes Relative: 15 %
MCH: 28 pg (ref 26.0–34.0)
MCHC: 33.4 g/dL (ref 30.0–36.0)
MCV: 83.7 fL (ref 78.0–100.0)
MONO ABS: 0.7 10*3/uL (ref 0.1–1.0)
MONOS PCT: 10 %
NEUTROS ABS: 5.2 10*3/uL (ref 1.7–7.7)
NEUTROS PCT: 75 %
Platelets: 220 10*3/uL (ref 150–400)
RBC: 4.97 MIL/uL (ref 3.87–5.11)
RDW: 13.6 % (ref 11.5–15.5)
WBC: 7.1 10*3/uL (ref 4.0–10.5)

## 2015-11-22 LAB — RAPID URINE DRUG SCREEN, HOSP PERFORMED
Amphetamines: NOT DETECTED
Barbiturates: NOT DETECTED
Benzodiazepines: POSITIVE — AB
COCAINE: NOT DETECTED
OPIATES: POSITIVE — AB
TETRAHYDROCANNABINOL: NOT DETECTED

## 2015-11-22 LAB — ACETAMINOPHEN LEVEL
ACETAMINOPHEN (TYLENOL), SERUM: 12 ug/mL (ref 10–30)
Acetaminophen (Tylenol), Serum: 46 ug/mL — ABNORMAL HIGH (ref 10–30)

## 2015-11-22 LAB — SALICYLATE LEVEL: Salicylate Lvl: <4 mg/dL (ref 2.8–30.0)

## 2015-11-22 LAB — MAGNESIUM: Magnesium: 1.9 mg/dL (ref 1.7–2.4)

## 2015-11-22 MED ORDER — PANTOPRAZOLE SODIUM 40 MG PO TBEC
40.0000 mg | DELAYED_RELEASE_TABLET | Freq: Every day | ORAL | Status: DC
  Administered 2015-11-23 – 2015-11-24 (×2): 40 mg via ORAL
  Filled 2015-11-22 (×2): qty 1

## 2015-11-22 MED ORDER — SODIUM CHLORIDE 0.9% FLUSH
3.0000 mL | Freq: Two times a day (BID) | INTRAVENOUS | Status: DC
  Administered 2015-11-22 – 2015-11-23 (×2): 3 mL via INTRAVENOUS

## 2015-11-22 MED ORDER — ONDANSETRON HCL 4 MG PO TABS: 4.0000 mg | ORAL_TABLET | Freq: Four times a day (QID) | ORAL | Status: DC | PRN

## 2015-11-22 MED ORDER — ACETYLCYSTEINE 20 % IN SOLN
140.0000 mg/kg | Freq: Once | RESPIRATORY_TRACT | Status: AC
  Administered 2015-11-22: 10040 mg via ORAL
  Filled 2015-11-22: qty 60

## 2015-11-22 MED ORDER — RIVAROXABAN 20 MG PO TABS
20.0000 mg | ORAL_TABLET | Freq: Every day | ORAL | Status: DC
  Administered 2015-11-22 – 2015-11-23 (×2): 20 mg via ORAL
  Filled 2015-11-22 (×3): qty 1

## 2015-11-22 MED ORDER — SODIUM CHLORIDE 0.9 % IV BOLUS (SEPSIS)
1000.0000 mL | Freq: Once | INTRAVENOUS | Status: AC
  Administered 2015-11-22: 1000 mL via INTRAVENOUS

## 2015-11-22 MED ORDER — PRAVASTATIN SODIUM 20 MG PO TABS
20.0000 mg | ORAL_TABLET | Freq: Every day | ORAL | Status: DC
  Administered 2015-11-22 – 2015-11-23 (×2): 20 mg via ORAL
  Filled 2015-11-22 (×3): qty 1

## 2015-11-22 MED ORDER — ACETYLCYSTEINE 20 % IN SOLN
70.0000 mg/kg | RESPIRATORY_TRACT | Status: AC
  Administered 2015-11-22 – 2015-11-23 (×5): 5020 mg via ORAL
  Filled 2015-11-22 (×14): qty 30

## 2015-11-22 MED ORDER — ACETYLCYSTEINE 20 % IN SOLN: 70.0000 mg/kg | RESPIRATORY_TRACT | Status: DC

## 2015-11-22 MED ORDER — SODIUM CHLORIDE 0.9 % IV SOLN
INTRAVENOUS | Status: DC
  Administered 2015-11-22 – 2015-11-24 (×3): via INTRAVENOUS

## 2015-11-22 MED ORDER — ONDANSETRON HCL 4 MG/2ML IJ SOLN
4.0000 mg | Freq: Four times a day (QID) | INTRAMUSCULAR | Status: DC | PRN
  Administered 2015-11-22 – 2015-11-24 (×2): 4 mg via INTRAVENOUS
  Filled 2015-11-22 (×2): qty 2

## 2015-11-22 MED ORDER — ACETYLCYSTEINE 20 % IN SOLN: 140.0000 mg/kg | Freq: Once | RESPIRATORY_TRACT | Status: DC

## 2015-11-22 MED ORDER — FLECAINIDE ACETATE 100 MG PO TABS
100.0000 mg | ORAL_TABLET | Freq: Two times a day (BID) | ORAL | Status: DC
  Administered 2015-11-22 – 2015-11-24 (×4): 100 mg via ORAL
  Filled 2015-11-22 (×5): qty 1

## 2015-11-22 NOTE — ED Notes (Signed)
Spoke to [employer]. Recommend supportive care, check CMP, Acetaminophen level, and Mag due to pt's prolonged QTc.

## 2015-11-22 NOTE — ED Notes (Signed)
Pt states she admits to SI- TAKE PILLS ATIVAN AND ANTIDEPRESSANT TO HARM HER SELF. Pt states taking medication approx. 6am. Pt unable to determine amount other than states "2 at a time". Husband states he found a letter in her hand writing with people to be at her funeral.

## 2015-11-22 NOTE — ED Notes (Signed)
Pt 02 sats can decrease with sleeping to 90-92% 2LNC APPLIED FOR SUPPORT

## 2015-11-22 NOTE — ED Notes (Signed)
Per [facility]- Called on for falls and slurred speech. Husband states depression onset x 2 days for recent family issues. Denies SI/HI. Admits to taking too many Ativan. Pt has had falls x 4 in 24 hours. NO LOC. Pt found pt in floor at 5 am with hand full of pills. Husband assisted pt back to bed. EMS found pills in bed with pt and white chalk substance around pt's mouth. Pt presented Ax0 x 4. Ambulatory on scene and active with care. NEG FOR STROKE. Denies N/V/D and fever.

## 2015-11-22 NOTE — H&P (Signed)
[facility] History and Physical  Danielle Rocha ZOX:[id number] DOB: May 28, 1951 DOA: 11/22/2015  Referring physician: ED physician, Dr. Estell Harpin  PCP: Tana Conch, MD   Chief Complaint: OD, suicidal attempt   HPI:  Pt is 65 yo female with known depression who was brought to [facility] ED via EMS after husband found her unresponsive on the floor in an apparent attempt to kill herself. Pt explains she has three children and the youngest with Down syndrome and has been fighting with her brother about the inheritance and felt very stressed out and admits to trying to kill herself. Currently feels very sad and sorry that she tried to do this as she know her youngest son needs her. She denies any chest pain or dyspnea at this time, no abd or urinary concerns, no visual changes, no headaches. She can not recall what she took but but combination of tylenol, ativan, lamictal.   In ED, pt is hemodynamically stable, VSS, blood work unremarkable except elevated tylenol level at 46. ER doctor consulted poison control and recommendation was to administer mucomyst and admit pt to telemetry unit for observation. [facility] asked to assist with admission.   Assessment and Plan: Active Problems:   Overdose / Suicidal attempt / Hx of depression  - on multiple medications - tylenol OD order set in place, follow the protocol - CMET In AM - agree with telemetry monitoring  - pt counseled at bedside, emotional support offered and pt appreciated our concern - psych consulted - sitter at bedside     Acute kidney injury  - pre renal, place on IVF and repeat BMP in AM    Atrial fibrillation, CHADS2 5 (pt with known hx of stroke) - s/p ablation - on Xarelto and Flecainide - continue here    Radiological Exams on Admission: Dg Chest 2 View 11/22/2015 Minimal RIGHT basilar atelectasis.   Ct Head Wo Contrast 11/22/2015 No acute intracranial pathology. No evidence of cervical spine injury.   Ct Cervical Spine Wo  Contrast 11/22/2015 No acute intracranial pathology. No evidence of cervical spine injury.   Code Status: Full Family Communication: Pt at bedside Disposition Plan: Admit for further evaluation    Danie Binder [facility] [phone]   Review of Systems:  Constitutional: Negative for diaphoresis.  HENT: Negative for hearing loss, ear pain, nosebleeds, congestion, sore throat, neck pain, tinnitus and ear discharge.   Eyes: Negative for blurred vision, double vision, photophobia, pain, discharge and redness.  Respiratory: Negative for cough, hemoptysis, sputum production, shortness of breath, wheezing and stridor.   Cardiovascular: Negative for chest pain, palpitations, orthopnea, claudication and leg swelling.  Gastrointestinal: Negative for heartburn, constipation, blood in stool and melena.  Genitourinary: Negative for dysuria, urgency, frequency, hematuria and flank pain.  Musculoskeletal: Negative for myalgias, back pain, joint pain and falls.  Skin: Negative for itching and rash.  Neurological: Negative for dizziness and weakness.  Endo/Heme/Allergies: Negative for environmental allergies and polydipsia. Does not bruise/bleed easily.  Psychiatric/Behavioral: Per HPI    Past Medical History  Diagnosis Date  . Arthritis     DJD, low back, thumb  . Chicken pox   . Depression     zoloft , remeron  per psychiatry. ambien  per psychiatry to help wtih sleep element.   Marland Kitchen GERD (gastroesophageal reflux disease)     omeprazole OTC  . Atrial fibrillation (HCC)     Xarelto anticoagulation. Flecainide antiarrythmic   . Hyperlipidemia     lovastatin   . Colon polyp  awaiting records  . Goiter     states multiple imaging tests, has had biopsies  . Dystonia     described as psychogenic dystonia. Pain and twisting from upper chest and up ith triggers "wind, creamy food" on TID ativan per psychiatry previously.   . Vaginal atrophy     estrace vaginal cream  . Chronic pain  syndrome     On disability. History of bilateral hip pain, low back pain. Gabapentin 400 BID, percocet 1 tablet daily per prior provider.   . Fibromyalgia   . TIA (transient ischemic attack)   . Stroke [facility])     TIA (left side of face and body decreased sensitivity than right face and side)    Past Surgical History  Procedure Laterality Date  . Piriformis release  1999  . Fusion l4-l5  2000  . Tonsillectomy and adenoidectomy  age 75  . Vaginal hysterectomy  1993  . Other surgical history      tennis elbow surgery  . Biopsy thyroid    . Laminectomy      L4-L5  . Atrial fibrillation ablation      Social History:  reports that she has never smoked. She has never used smokeless tobacco. She reports that she does not drink alcohol or use illicit drugs.  Allergies  Allergen Reactions  . Fish Allergy Anaphylaxis    Shell Fish   . Reglan [Metoclopramide] Other (See Comments)    Suicidal thoughts  . Ciprofloxacin Diarrhea  . Clindamycin/Lincomycin Diarrhea  . Vibramycin [Doxycycline] Diarrhea  . Sulfa Antibiotics Rash    Family History  Problem Relation Age of Onset  . Alcohol abuse Mother   . Hypertension Father   . Alcohol abuse Father   . Pancreatic cancer Father   . Cancer Brother     spindle cell RLE  . Cancer Brother     tonsil cancer  . Skin cancer Brother     Prior to Admission medications   Medication Sig Start Date End Date Taking? Authorizing Provider  LORazepam (ATIVAN) 1 MG tablet Take 1 mg by mouth 3 (three) times daily as needed (for dystonia reactions).    Yes Historical Provider, MD  cyclobenzaprine (FLEXERIL) 5 MG tablet Take 1 tablet (5 mg total) by mouth 3 (three) times daily as needed for muscle spasms. 10/15/15   Shelva Majestic, MD  Estradiol (VAGIFEM) 10 MCG TABS vaginal tablet Place 1 tablet vaginal qhs x 1 week, the use 1 tablet per vagina 2 x a week at bedtime Patient taking differently: Place 1 tablet vaginally once a week.  06/06/15   Romualdo Bolk, MD  flecainide (TAMBOCOR) 100 MG tablet Take 1 tablet (100 mg total) by mouth 2 (two) times daily. 08/18/15   Shelva Majestic, MD  gabapentin (NEURONTIN) 800 MG tablet Take 1 tablet (800 mg total) by mouth 2 (two) times daily. 10/10/15   Shelva Majestic, MD  HYDROcodone-acetaminophen (NORCO/VICODIN) 5-325 MG tablet Take 0.5 tablets by mouth 3 (three) times daily as needed. May fill 10/03/15 10/02/15   Shelva Majestic, MD  lamoTRIgine (LAMICTAL) 25 MG tablet Take 25 mg by mouth at bedtime.     Historical Provider, MD  lovastatin (MEVACOR) 20 MG tablet Take 20 mg by mouth at bedtime.    Historical Provider, MD  Lurasidone HCl (LATUDA) 60 MG TABS Take 60 mg by mouth at bedtime.    Historical Provider, MD  mirtazapine (REMERON) 30 MG tablet Take 30 mg by mouth at  bedtime.    Historical Provider, MD  Multiple Vitamins-Minerals (MULTIVITAMIN GUMMIES ADULTS PO) Take 1 each by mouth 3 (three) times a week.     Historical Provider, MD  omeprazole (PRILOSEC) 20 MG capsule Take 1 capsule (20 mg total) by mouth 2 (two) times daily before a meal. 09/06/15   Shelva Majestic, MD  rivaroxaban (XARELTO) 20 MG TABS tablet Take 1 tablet (20 mg total) by mouth daily with supper. 05/07/15   Shelva Majestic, MD  zolpidem (AMBIEN) 10 MG tablet Take 10 mg by mouth at bedtime.     Historical Provider, MD    Physical Exam: Filed Vitals:   11/22/15 1245 11/22/15 1300 11/22/15 1315 11/22/15 1526  BP: 134/66 136/77 100/74 109/64  Pulse: 76 78 79 70  Temp:      TempSrc:      Resp: Height:      Weight:      SpO2: 100% 100% 100% 98%    Physical Exam  Constitutional: Appears well-developed and well-nourished. No distress.  HENT: Normocephalic. External right and left ear normal. Oropharynx is clear and moist.  Eyes: Conjunctivae and EOM are normal. PERRLA, no scleral icterus.  Neck: Normal ROM. Neck supple. No JVD. No tracheal deviation. No thyromegaly.  CVS: RRR, S1/S2 +, no murmurs, no  gallops, no carotid bruit.  Pulmonary: Effort and breath sounds normal, no stridor, rhonchi, wheezes, rales.  Abdominal: Soft. BS +,  no distension, tenderness, rebound or guarding.  Musculoskeletal: Normal range of motion. No edema and no tenderness.  Lymphadenopathy: No lymphadenopathy noted, cervical, inguinal. Neuro: Alert. Normal reflexes, muscle tone coordination. No cranial nerve deficit. Skin: Skin is warm and dry. No rash noted. Not diaphoretic. No erythema. No pallor.  Psychiatric: Normal mood and affect. Behavior, judgment, thought content normal.   Labs on Admission:  Basic Metabolic Panel:  Recent Labs Lab 11/22/15 1130  NA 141  K 3.8  CL 106  CO2 25  GLUCOSE 119*  BUN 19  CREATININE 1.08*  CALCIUM 9.1  MG 1.9   Liver Function Tests:  Recent Labs Lab 11/22/15 1130  AST 18  ALT 13*  ALKPHOS 66  BILITOT 0.6  PROT 7.1  ALBUMIN 4.2   CBC:  Recent Labs Lab 11/22/15 1130  WBC 7.1  NEUTROABS 5.2  HGB 13.9  HCT 41.6  MCV 83.7  PLT 220   EKG: pending   If 7PM-7AM, please contact night-coverage [email or web address] Password [facility] 11/22/2015, 3:31 PM

## 2015-11-22 NOTE — ED Provider Notes (Signed)
CSN: [id number]     Arrival date & time 11/22/15  1012 History   First MD Initiated Contact with Patient 11/22/15 1051     Chief Complaint  Patient presents with  . Ingestion  . Weakness  . Depression  . Fall     (Consider location/radiation/quality/duration/timing/severity/associated sxs/prior Treatment) Patient is a 65 y.o. female presenting with Ingested Medication, weakness, depression, and fall. The history is provided by the EMS personnel and the spouse (Patient took Ativan may be low to the and Lamictal last night 1 pill every hour in attempt to kill herself today. Her husband found on the floor. She was confused and ataxic).  Ingestion This is a new problem. The current episode started 6 to 12 hours ago. The problem occurs constantly. The problem has not changed since onset.Pertinent negatives include no headaches. Nothing aggravates the symptoms. Nothing relieves the symptoms.  Weakness Pertinent negatives include no headaches.  Depression Pertinent negatives include no headaches.  Fall Pertinent negatives include no headaches.    Past Medical History  Diagnosis Date  . Arthritis     DJD, low back, thumb  . Chicken pox   . Depression     zoloft 100mg , remeron 30mg  per psychiatry. ambien 10mg  per psychiatry to help wtih sleep element.   Marland Kitchen GERD (gastroesophageal reflux disease)     omeprazole OTC  . Atrial fibrillation (HCC)     Xarelto anticoagulation. Flecainide antiarrythmic   . Hyperlipidemia     lovastatin 20mg   . Colon polyp     awaiting records  . Goiter     states multiple imaging tests, has had biopsies  . Dystonia     described as psychogenic dystonia. Pain and twisting from upper chest and up ith triggers "wind, creamy food" on TID ativan per psychiatry previously.   . Vaginal atrophy     estrace vaginal cream  . Chronic pain syndrome     On disability. History of bilateral hip pain, low back pain. Gabapentin 400 BID, percocet 1 tablet daily per prior  provider.   . Fibromyalgia   . TIA (transient ischemic attack)   . Stroke [facility])     TIA (left side of face and body decreased sensitivity than right face and side)   Past Surgical History  Procedure Laterality Date  . Piriformis release  1999  . Fusion l4-l5  2000  . Tonsillectomy and adenoidectomy  age 42  . Vaginal hysterectomy  1993  . Other surgical history      tennis elbow surgery  . Biopsy thyroid    . Laminectomy      L4-L5  . Atrial fibrillation ablation     Family History  Problem Relation Age of Onset  . Alcohol abuse Mother   . Hypertension Father   . Alcohol abuse Father   . Pancreatic cancer Father   . Cancer Brother     spindle cell RLE  . Cancer Brother     tonsil cancer  . Skin cancer Brother    Social History  Substance Use Topics  . Smoking status: Never Smoker   . Smokeless tobacco: Never Used  . Alcohol Use: No   OB History    Gravida Para Term Preterm AB TAB SAB Ectopic Multiple Living   3 3 2 1      3      Review of Systems  Constitutional: Negative for appetite change and fatigue.  HENT: Negative for congestion, ear discharge and sinus pressure.   Eyes: Negative for  discharge.  Respiratory: Negative for cough.   Gastrointestinal: Negative for diarrhea.  Genitourinary: Negative for frequency and hematuria.  Musculoskeletal: Negative for back pain.  Skin: Negative for rash.  Neurological: Positive for weakness. Negative for seizures and headaches.  Psychiatric/Behavioral: Positive for depression and dysphoric mood. Negative for hallucinations.      Allergies  Fish allergy; Reglan; Ciprofloxacin; Clindamycin/lincomycin; Vibramycin; and Sulfa antibiotics  Home Medications   Prior to Admission medications   Medication Sig Start Date End Date Taking? Authorizing Provider  LORazepam (ATIVAN) 1 MG tablet Take 1 mg by mouth 3 (three) times daily as needed (for dystonia reactions).    Yes Historical Provider, MD  cyclobenzaprine (FLEXERIL)  5 MG tablet Take 1 tablet (5 mg total) by mouth 3 (three) times daily as needed for muscle spasms. 10/15/15   Shelva Majestic, MD  Estradiol (VAGIFEM) 10 MCG TABS vaginal tablet Place 1 tablet vaginal qhs x 1 week, the use 1 tablet per vagina 2 x a week at bedtime Patient taking differently: Place 1 tablet vaginally once a week.  06/06/15   Romualdo Bolk, MD  flecainide (TAMBOCOR) 100 MG tablet Take 1 tablet (100 mg total) by mouth 2 (two) times daily. 08/18/15   Shelva Majestic, MD  gabapentin (NEURONTIN) 800 MG tablet Take 1 tablet (800 mg total) by mouth 2 (two) times daily. 10/10/15   Shelva Majestic, MD  HYDROcodone-acetaminophen (NORCO/VICODIN) 5-325 MG tablet Take 0.5 tablets by mouth 3 (three) times daily as needed. May fill 10/03/15 10/02/15   Shelva Majestic, MD  lamoTRIgine (LAMICTAL) 25 MG tablet Take 25 mg by mouth at bedtime.     Historical Provider, MD  lovastatin (MEVACOR) 20 MG tablet Take 20 mg by mouth at bedtime.    Historical Provider, MD  Lurasidone HCl (LATUDA) 60 MG TABS Take 60 mg by mouth at bedtime.    Historical Provider, MD  mirtazapine (REMERON) 30 MG tablet Take 30 mg by mouth at bedtime.    Historical Provider, MD  Multiple Vitamins-Minerals (MULTIVITAMIN GUMMIES ADULTS PO) Take 1 each by mouth 3 (three) times a week.     Historical Provider, MD  omeprazole (PRILOSEC) 20 MG capsule Take 1 capsule (20 mg total) by mouth 2 (two) times daily before a meal. 09/06/15   Shelva Majestic, MD  rivaroxaban (XARELTO) 20 MG TABS tablet Take 1 tablet (20 mg total) by mouth daily with supper. 05/07/15   Shelva Majestic, MD  zolpidem (AMBIEN) 10 MG tablet Take 10 mg by mouth at bedtime.     Historical Provider, MD   BP 100/74 mmHg  Pulse 79  Temp(Src) 98 F (36.7 C) (Oral)  Resp 11  Ht  (1.727 m)  Wt 158 lb (71.668 kg)  BMI 24.03 kg/m2  SpO2 100% Physical Exam  Constitutional: She is oriented to person, place, and time. She appears well-developed.  HENT:  Head:  Normocephalic.  Eyes: Conjunctivae and EOM are normal. No scleral icterus.  Neck: Neck supple. No thyromegaly present.  Cardiovascular: Normal rate and regular rhythm.  Exam reveals no gallop and no friction rub.   No murmur heard. Pulmonary/Chest: No stridor. She has no wheezes. She has no rales. She exhibits no tenderness.  Abdominal: She exhibits no distension. There is no tenderness. There is no rebound.  Musculoskeletal: Normal range of motion. She exhibits no edema.  Lymphadenopathy:    She has no cervical adenopathy.  Neurological: She is oriented to person, place, and time. She exhibits  normal muscle tone. Coordination normal.  Patient lethargic and ataxic  Skin: No rash noted. No erythema.  Psychiatric:  Depressed and suicidal    ED Course  Procedures (including critical care time) Labs Review Labs Reviewed  COMPREHENSIVE METABOLIC PANEL - Abnormal; Notable for the following:    Glucose, Bld 119 (*)    Creatinine, Ser 1.08 (*)    ALT 13 (*)    GFR calc non Af Amer 53 (*)    All other components within normal limits  ACETAMINOPHEN LEVEL - Abnormal; Notable for the following:    Acetaminophen (Tylenol), Serum 46 (*)    All other components within normal limits  URINALYSIS, ROUTINE W REFLEX MICROSCOPIC (NOT AT [facility]) - Abnormal; Notable for the following:    Color, Urine AMBER (*)    APPearance CLOUDY (*)    Specific Gravity, Urine 1.039 (*)    Hgb urine dipstick MODERATE (*)    Nitrite POSITIVE (*)    Leukocytes, UA TRACE (*)    All other components within normal limits  URINE RAPID DRUG SCREEN, HOSP PERFORMED - Abnormal; Notable for the following:    Opiates POSITIVE (*)    Benzodiazepines POSITIVE (*)    All other components within normal limits  URINE MICROSCOPIC-ADD ON - Abnormal; Notable for the following:    Squamous Epithelial / LPF 0-5 (*)    Bacteria, UA FEW (*)    All other components within normal limits  CBC WITH DIFFERENTIAL/PLATELET  SALICYLATE LEVEL   MAGNESIUM    Imaging Review Dg Chest 2 View  11/22/2015  CLINICAL DATA:  Ingestion of Ativan, and a number, dosing off and on, fall, history atrial fibrillation, fibromyalgia, stroke, TIA, GERD EXAM: CHEST  2 VIEW COMPARISON:  09/02/2015 FINDINGS: Upper normal heart size. No mediastinal contours and pulmonary vascularity. Minimal RIGHT basilar atelectasis. Lungs otherwise clear. No pleural effusion or pneumothorax. Bones demineralized. IMPRESSION: Minimal RIGHT basilar atelectasis. Electronically Signed   By: Ulyses Southward M.D.   On: 11/22/2015 12:11   Ct Head Wo Contrast  11/22/2015  CLINICAL DATA:  Fall.  Slurred speech. EXAM: CT HEAD WITHOUT CONTRAST CT CERVICAL SPINE WITHOUT CONTRAST TECHNIQUE: Multidetector CT imaging of the head and cervical spine was performed following the standard protocol without intravenous contrast. Multiplanar CT image reconstructions of the cervical spine were also generated. COMPARISON:  None. FINDINGS: CT HEAD FINDINGS Brain parenchyma, ventricular system, and extra-axial space are within normal limits. No mass effect, midline shift, or acute hemorrhage. Cranium is intact. CT CERVICAL SPINE FINDINGS No acute fracture. No dislocation. Anatomic alignment. No obvious soft tissue hematoma or injury. There is left-sided facet arthropathy at C6-7. IMPRESSION: No acute intracranial pathology. No evidence of cervical spine injury. Electronically Signed   By: Jolaine Click M.D.   On: 11/22/2015 12:12   Ct Cervical Spine Wo Contrast  11/22/2015  CLINICAL DATA:  Fall.  Slurred speech. EXAM: CT HEAD WITHOUT CONTRAST CT CERVICAL SPINE WITHOUT CONTRAST TECHNIQUE: Multidetector CT imaging of the head and cervical spine was performed following the standard protocol without intravenous contrast. Multiplanar CT image reconstructions of the cervical spine were also generated. COMPARISON:  None. FINDINGS: CT HEAD FINDINGS Brain parenchyma, ventricular system, and extra-axial space are within  normal limits. No mass effect, midline shift, or acute hemorrhage. Cranium is intact. CT CERVICAL SPINE FINDINGS No acute fracture. No dislocation. Anatomic alignment. No obvious soft tissue hematoma or injury. There is left-sided facet arthropathy at C6-7. IMPRESSION: No acute intracranial pathology. No evidence of cervical spine  injury. Electronically Signed   By: Jolaine Click M.D.   On: 11/22/2015 12:12   I have personally reviewed and evaluated these images and lab results as part of my medical decision-making.   EKG Interpretation   Date/Time:  Thursday November 22 2015 11:05:34 EST Ventricular Rate:  72 PR Interval:  258 QRS Duration: 136 QT Interval:  458 QTC Calculation: 501 R Axis:   49 Text Interpretation:  Sinus rhythm Prolonged PR interval Consider left  atrial enlargement Nonspecific intraventricular conduction delay Confirmed  by Jammal Sarr  MD, Harveer Sadler [phone]) on 11/22/2015 3:08:11 PM     CRITICAL CARE Performed by: Cristabel Bicknell L Total critical care time: 40 minutes Critical care time was exclusive of separately billable procedures and treating other patients. Critical care was necessary to treat or prevent imminent or life-threatening deterioration. Critical care was time spent personally by me on the following activities: development of treatment plan with patient and/or surrogate as well as nursing, discussions with consultants, evaluation of patient's response to treatment, examination of patient, obtaining history from patient or surrogate, ordering and performing treatments and interventions, ordering and review of laboratory studies, ordering and review of radiographic studies, pulse oximetry and re-evaluation of patient's condition.  MDM   Final diagnoses:  Suicidal behavior    Patient had Tylenol level that was elevated. I spoke to poison control who recommended Mucomyst for 24 hours patient was reevaluated and she seems to be much more alert now and is able to  ambulate without falling. She will be admitted to telemetry by medicine and give him Mucomyst  Bethann Berkshire, MD 11/22/15 1525

## 2015-11-22 NOTE — ED Notes (Signed)
Patient transported to CT 

## 2015-11-23 ENCOUNTER — Observation Stay (HOSPITAL_COMMUNITY): Payer: BLUE CROSS/BLUE SHIELD

## 2015-11-23 DIAGNOSIS — T50902S Poisoning by unspecified drugs, medicaments and biological substances, intentional self-harm, sequela: Secondary | ICD-10-CM

## 2015-11-23 DIAGNOSIS — I482 Chronic atrial fibrillation: Secondary | ICD-10-CM | POA: Diagnosis not present

## 2015-11-23 DIAGNOSIS — N179 Acute kidney failure, unspecified: Secondary | ICD-10-CM

## 2015-11-23 DIAGNOSIS — R45851 Suicidal ideations: Secondary | ICD-10-CM

## 2015-11-23 DIAGNOSIS — T424X2A Poisoning by benzodiazepines, intentional self-harm, initial encounter: Secondary | ICD-10-CM

## 2015-11-23 DIAGNOSIS — M7989 Other specified soft tissue disorders: Secondary | ICD-10-CM | POA: Diagnosis not present

## 2015-11-23 DIAGNOSIS — M25561 Pain in right knee: Secondary | ICD-10-CM | POA: Diagnosis not present

## 2015-11-23 DIAGNOSIS — F332 Major depressive disorder, recurrent severe without psychotic features: Secondary | ICD-10-CM | POA: Diagnosis not present

## 2015-11-23 DIAGNOSIS — T1491 Suicide attempt: Secondary | ICD-10-CM

## 2015-11-23 LAB — COMPREHENSIVE METABOLIC PANEL
ALK PHOS: 59 U/L (ref 38–126)
ALT: 12 U/L — AB (ref 14–54)
ANION GAP: 11 (ref 5–15)
AST: 15 U/L (ref 15–41)
Albumin: 3.8 g/dL (ref 3.5–5.0)
BILIRUBIN TOTAL: 0.9 mg/dL (ref 0.3–1.2)
BUN: 11 mg/dL (ref 6–20)
CHLORIDE: 107 mmol/L (ref 101–111)
CO2: 26 mmol/L (ref 22–32)
Calcium: 9.1 mg/dL (ref 8.9–10.3)
Creatinine, Ser: 0.82 mg/dL (ref 0.44–1.00)
GFR calc Af Amer: >60 mL/min (ref >60)
GFR calc non Af Amer: >60 mL/min (ref >60)
GLUCOSE: 119 mg/dL — AB (ref 65–99)
POTASSIUM: 3.7 mmol/L (ref 3.5–5.1)
Sodium: 144 mmol/L (ref 135–145)
TOTAL PROTEIN: 6.6 g/dL (ref 6.5–8.1)

## 2015-11-23 LAB — CBC
HEMATOCRIT: 38.9 % (ref 36.0–46.0)
HEMOGLOBIN: 13.2 g/dL (ref 12.0–15.0)
MCH: 27.9 pg (ref 26.0–34.0)
MCHC: 33.9 g/dL (ref 30.0–36.0)
MCV: 82.2 fL (ref 78.0–100.0)
Platelets: 227 10*3/uL (ref 150–400)
RBC: 4.73 MIL/uL (ref 3.87–5.11)
RDW: 13.5 % (ref 11.5–15.5)
WBC: 8.4 10*3/uL (ref 4.0–10.5)

## 2015-11-23 LAB — PROTIME-INR
INR: 1.95 — ABNORMAL HIGH (ref 0.00–1.49)
Prothrombin Time: 22.1 seconds — ABNORMAL HIGH (ref 11.6–15.2)

## 2015-11-23 LAB — ACETAMINOPHEN LEVEL: Acetaminophen (Tylenol), Serum: <10 ug/mL — ABNORMAL LOW (ref 10–30)

## 2015-11-23 MED ORDER — LOPERAMIDE HCL 2 MG PO CAPS
4.0000 mg | ORAL_CAPSULE | ORAL | Status: DC | PRN
  Administered 2015-11-23 (×3): 4 mg via ORAL
  Filled 2015-11-23 (×3): qty 2

## 2015-11-23 NOTE — Discharge Summary (Addendum)
Physician Discharge Summary  Danielle Rocha ZOX:[id number] DOB: 06/07/1951 DOA: 11/22/2015  PCP: Tana ConchStephen Hunter, MD  Admit date: 11/22/2015 Discharge date: 11/23/2015  Recommendations for Outpatient Follow-up:  1. Discharge to psych  Discharge Diagnoses:  Principal Problem:   Suicidal overdose [facility](HCC) Active Problems:   Atrial fibrillation (HCC)   Depression   Suicidal intent   Acute kidney injury [facility](HCC)    Discharge Condition: stable   Diet recommendation: as tolerated   History of present illness:   Per HPI on 11/22/15 by Dr. Izola PriceMyers "65 yo female with known depression who [facility] after husband found her unresponsive on the floor in an apparent attempt to kill herself. Pt explains she has three children and the youngest with Down syndrome and has been fighting with her brother about the inheritance and felt very stressed out and admits to trying to kill herself. Currently feels very sad and sorry that she tried to do this as she know her youngest son needs her. She denies any chest pain or dyspnea at this time, no abd or urinary concerns, no visual changes, no headaches. She can not recall what she took but but combination of tylenol, ativan, lamictal.   In ED, pt is hemodynamically stable, VSS, blood work unremarkable except elevated tylenol level at 46. ER doctor consulted poison control and recommendation was to administer mucomyst and admit pt to telemetry unit for observation. [facility] asked to assist with admission. "   Hospital Course:   Active Problems:  Overdose / Suicidal attempt / Hx of depression  - Tylenol OD order set in place, follow the protocol - Has received Mucomyst - Tylenol this am less than 10 - From medical standpoint stable for discharge  - Needs inpatient psych per psych evaluation    Acute kidney injury  - Pre-renal - Now WNL with fluids    Atrial fibrillation, CHADS2 5 (pt with known hx of stroke) - Pt is s/p ablation - Continue  Xarelto and Flecainide   Code Status: Full Family Communication: Husband at bedside    Signed:  Manson PasseyEVINE, Danielle Valtierra, MD  [facility] 11/23/2015, 2:15 PM  Pager #: [phone]  Time spent in minutes: more than 30 minutes  Discharge Exam: Filed Vitals:   11/22/15 2204 11/23/15 0300  BP: 102/46 102/41  Pulse: 76 66  Temp: 98.3 F (36.8 C) 98.7 F (37.1 C)  Resp: 18 16   Filed Vitals:   11/22/15 1726 11/22/15 1802 11/22/15 2204 11/23/15 0300  BP: 127/70 141/62 102/46 102/41  Pulse: 65 72 76 66  Temp: 97.5 F (36.4 C) 97.9 F (36.6 C) 98.3 F (36.8 C) 98.7 F (37.1 C)  TempSrc: Axillary Oral Oral Oral  Resp: 18 18 18 16   Height:      Weight:      SpO2: 98% 98% 98% 96%    General: Pt is alert, follows commands appropriately, not in acute distress Cardiovascular: Regular rate and rhythm, S1/S2 +, no murmurs Respiratory: Clear to auscultation bilaterally, no wheezing, no crackles, no rhonchi Abdominal: Soft, non tender, non distended, bowel sounds +, no guarding Extremities: no edema, no cyanosis, pulses palpable bilaterally DP and PT Neuro: Grossly nonfocal  Discharge Instructions     Medication List    STOP taking these medications        Estradiol 10 MCG Tabs vaginal tablet  Commonly known as:  VAGIFEM     gabapentin 800 MG tablet  Commonly known as:  NEURONTIN  HYDROcodone-acetaminophen 5-325 MG tablet  Commonly known as:  NORCO/VICODIN     lamoTRIgine 200 MG tablet  Commonly known as:  LAMICTAL     LATUDA 60 MG Tabs  Generic drug:  Lurasidone HCl     zolpidem 10 MG tablet  Commonly known as:  AMBIEN      TAKE these medications        atorvastatin 20 MG tablet  Commonly known as:  LIPITOR  Take 20 mg by mouth daily.     cyclobenzaprine 5 MG tablet  Commonly known as:  FLEXERIL  Take 1 tablet (5 mg total) by mouth 3 (three) times daily as needed for muscle spasms.     flecainide 100 MG tablet  Commonly known as:  TAMBOCOR  Take 1  tablet (100 mg total) by mouth 2 (two) times daily.     LORazepam 1 MG tablet  Commonly known as:  ATIVAN  Take 1 mg by mouth 3 (three) times daily as needed (for dystonia reactions).     mirtazapine 30 MG tablet  Commonly known as:  REMERON  Take 30 mg by mouth at bedtime.     omeprazole 20 MG capsule  Commonly known as:  PRILOSEC  Take 1 capsule (20 mg total) by mouth 2 (two) times daily before a meal.     rivaroxaban 20 MG Tabs tablet  Commonly known as:  XARELTO  Take 1 tablet (20 mg total) by mouth daily with supper.            Follow-up Information    Follow up with Tana Conch, MD. Schedule an appointment as soon as possible for a visit in 2 weeks.   Specialty:  Family Medicine   Why:  Follow up appt after recent hospitalization   Contact information:   [address] [place] [address] [phone]        The results of significant diagnostics from this hospitalization (including imaging, microbiology, ancillary and laboratory) are listed below for reference.    Significant Diagnostic Studies: Dg Chest 2 View  11/22/2015  CLINICAL DATA:  Ingestion of Ativan, and a number, dosing off and on, fall, history atrial fibrillation, fibromyalgia, stroke, TIA, GERD EXAM: CHEST  2 VIEW COMPARISON:  09/02/2015 FINDINGS: Upper normal heart size. No mediastinal contours and pulmonary vascularity. Minimal RIGHT basilar atelectasis. Lungs otherwise clear. No pleural effusion or pneumothorax. Bones demineralized. IMPRESSION: Minimal RIGHT basilar atelectasis. Electronically Signed   By: Danielle Rocha M.D.   On: 11/22/2015 12:11   Ct Head Wo Contrast  11/22/2015  CLINICAL DATA:  Fall.  Slurred speech. EXAM: CT HEAD WITHOUT CONTRAST CT CERVICAL SPINE WITHOUT CONTRAST TECHNIQUE: Multidetector CT imaging of the head and cervical spine was performed following the standard protocol without intravenous contrast. Multiplanar CT image reconstructions of the cervical spine were also  generated. COMPARISON:  None. FINDINGS: CT HEAD FINDINGS Brain parenchyma, ventricular system, and extra-axial space are within normal limits. No mass effect, midline shift, or acute hemorrhage. Cranium is intact. CT CERVICAL SPINE FINDINGS No acute fracture. No dislocation. Anatomic alignment. No obvious soft tissue hematoma or injury. There is left-sided facet arthropathy at C6-7. IMPRESSION: No acute intracranial pathology. No evidence of cervical spine injury. Electronically Signed   By: Jolaine Click M.D.   On: 11/22/2015 12:12   Ct Cervical Spine Wo Contrast  11/22/2015  CLINICAL DATA:  Fall.  Slurred speech. EXAM: CT HEAD WITHOUT CONTRAST CT CERVICAL SPINE WITHOUT CONTRAST TECHNIQUE: Multidetector CT imaging of the head and  cervical spine was performed following the standard protocol without intravenous contrast. Multiplanar CT image reconstructions of the cervical spine were also generated. COMPARISON:  None. FINDINGS: CT HEAD FINDINGS Brain parenchyma, ventricular system, and extra-axial space are within normal limits. No mass effect, midline shift, or acute hemorrhage. Cranium is intact. CT CERVICAL SPINE FINDINGS No acute fracture. No dislocation. Anatomic alignment. No obvious soft tissue hematoma or injury. There is left-sided facet arthropathy at C6-7. IMPRESSION: No acute intracranial pathology. No evidence of cervical spine injury. Electronically Signed   By: Jolaine Click M.D.   On: 11/22/2015 12:12   Dg Bone Density  10/31/2015  EXAM: DUAL X-RAY ABSORPTIOMETRY (DXA) FOR BONE MINERAL DENSITY IMPRESSION: Referring Physician:  Shelva Majestic PATIENT: Name: Sokhna, Christoph Patient ID: [id number] Birth Date: 1951/06/26 Height: 67.0 in. Sex: Female Measured: 10/31/2015 Weight: 158.0 lbs. Indications: Caucasian, Estrogen Deficient, Family History of Osteoporosis, Height Loss (781.91), Low Calcium Intake (269.3), Postmenopausal Fractures: Treatments: ASSESSMENT: The BMD measured at Femur Neck Left is  1.029 g/cm2 with a T-score of -0.1. This patient is considered normal according to World Health Organization [facility]) criteria. L-3, L-4 were excluded due to degenerative changes and surgical hardware. Site Region Measured Date Measured Age YA BMD Significant CHANGE T-score DualFemur Neck Left 10/31/[age]    63.5         -0.1    1.029 g/cm2 AP Spine  L1-L2     10/31/2015    63.5         0.7     1.255 g/cm2 World Health Organization [facility]) criteria for post-menopausal, Caucasian Women: Normal       T-score at or above -1 SD Osteopenia   T-score between -1 and -2.5 SD Osteoporosis T-score at or below -2.5 SD RECOMMENDATION: National Osteoporosis Foundation recommends that FDA-approved medical therapies be considered in postmenopausal women and men age 41 or older with a: 1. Hip or vertebral (clinical or morphometric) fracture. 2. T-score of <-2.5 at the spine or hip. 3. Ten-year fracture probability by FRAX of 3% or greater for hip fracture or 20% or greater for major osteoporotic fracture. All treatment decisions require clinical judgment and consideration of individual patient factors, including patient preferences, co-morbidities, previous drug use, risk factors not captured in the FRAX model (e.g. falls, vitamin D deficiency, increased bone turnover, interval significant decline in bone density) and possible under - or over-estimation of fracture risk by FRAX. All patients should ensure an adequate intake of dietary calcium (1200 mg/d) and vitamin D (800 IU daily) unless contraindicated. FOLLOW-UP: People with diagnosed cases of osteoporosis or at high risk for fracture should have regular bone mineral density tests. For patients eligible for Medicare, routine testing is allowed once every 2 years. The testing frequency can be increased to one year for patients who have rapidly progressing disease, those who are receiving or discontinuing medical therapy to restore bone mass, or have additional risk factors. I have  reviewed this report, and agree with the above findings. [facility] Electronically Signed   By: David  [place] M.D.   On: 10/31/2015 14:49   Mm Digital Screening Bilateral  11/22/2015  CLINICAL DATA:  Screening. EXAM: DIGITAL SCREENING BILATERAL MAMMOGRAM WITH CAD COMPARISON:  None. ACR Breast Density Category c: The breast tissue is heterogeneously dense, which may obscure small masses. FINDINGS: In the right breast an asymmetry requires further evaluation. In the left breast an asymmetry requires further evaluation. Images were processed with CAD. IMPRESSION: Further evaluation is suggested for an asymmetry in  the right breast. Further evaluation is suggested for an asymmetry in the left breast. RECOMMENDATION: 1. Diagnostic mammogram and possibly ultrasound of both breasts. (Code:FI-B-22M) 2. No comparison mammograms are available at the time of interpretation. If the prior exams become available, an addendum will be made to this report. The patient will be contacted regarding the findings, and additional imaging will be scheduled. BI-RADS CATEGORY  0: Incomplete. Need additional imaging evaluation and/or prior mammograms for comparison. Electronically Signed   By: Frederico Hamman M.D.   On: 11/22/2015 07:55    Microbiology: No results found for this or any previous visit (from the past 240 hour(s)).   Labs: Basic Metabolic Panel:  Recent Labs Lab 11/22/15 1130 11/23/15 0547  NA 141 144  K 3.8 3.7  CL 106 107  CO2 25 26  GLUCOSE 119* 119*  BUN 19 11  CREATININE 1.08* 0.82  CALCIUM 9.1 9.1  MG 1.9  --    Liver Function Tests:  Recent Labs Lab 11/22/15 1130 11/23/15 0547  AST 18 15  ALT 13* 12*  ALKPHOS 66 59  BILITOT 0.6 0.9  PROT 7.1 6.6  ALBUMIN 4.2 3.8   No results for input(s): LIPASE, AMYLASE in the last 168 hours. No results for input(s): AMMONIA in the last 168 hours. CBC:  Recent Labs Lab 11/22/15 1130 11/23/15 0547  WBC 7.1 8.4  NEUTROABS 5.2  --    HGB 13.9 13.2  HCT 41.6 38.9  MCV 83.7 82.2  PLT 220 227   Cardiac Enzymes: No results for input(s): CKTOTAL, CKMB, CKMBINDEX, TROPONINI in the last 168 hours. BNP: BNP (last 3 results) No results for input(s): BNP in the last 8760 hours.  ProBNP (last 3 results) No results for input(s): PROBNP in the last 8760 hours.  CBG: No results for input(s): GLUCAP in the last 168 hours.

## 2015-11-23 NOTE — Progress Notes (Signed)
PHARMACIST - PHYSICIAN COMMUNICATION CONCERNING:  N-AC for APAP overdose  PO N-AC loading dose given at ~1600 3/2 and patient received 3 maintenance doses prior to AM labs collected this morning.  AM labs: AST/ALT 15/12; APAP level <10, INR 1.95 (NOTE patient is on Xarelto PTA and this elevates INR)  Per RN, patient is not experiencing any symptoms of nausea, AMS.    RECOMMENDATION: These results were discussed with Poison Control and their recommendations are: - Labs do not need to be repeated - Have patient receive last dose of N-AC at 1200 today and does not require further doses - Patient may be discharged after last dose of N-AC   Clance BollAmanda Ennifer Harston, PharmD, BCPS Pager: [phone] 11/23/2015 9:45 AM

## 2015-11-23 NOTE — Progress Notes (Signed)
[place], CSW, [phone]( [phone] ) will assist with d/c planning to in pt psych. Pt is willing to accept psych placement.  Cori RazorJamie Jerlean Peralta LCSW [phone]

## 2015-11-23 NOTE — Discharge Instructions (Signed)
Suicidal Feelings: How to Help Yourself °Suicide is the taking of one's own life. If you feel as though life is getting too tough to handle and are thinking about suicide, get help right away. To get help: °· Call your local emergency services (911 in the [place] °· Call a suicide hotline to speak with a trained counselor who understands how you are feeling. The following is a list of suicide hotlines in the [place]. For a list of hotlines in [place], visit www.suicide.org/hotlines/international/[place]-suicide-hotlines.html. °¨  1-800-273-TALK (1-800-273-8255). °¨  1-800-SUICIDE (1-800-784-2433). °¨  1-888-628-9454. This is a hotline for Spanish speakers. °¨  1-800-799-4TTY (1-800-799-4889). This is a hotline for TTY users. °¨  1-866-4-U-TREVOR (1-866-488-7386). This is a hotline for lesbian, gay, bisexual, transgender, or questioning youth. °· Contact a crisis center or a local suicide prevention center. To find a crisis center or suicide prevention center: °¨ Call your local hospital, clinic, community service organization, mental health center, social service provider, or health department. Ask for assistance in connecting to a crisis center. °¨ Visit www.suicidepreventionlifeline.org/getinvolved/locator for a list of crisis centers in the [place], or visit www.suicideprevention.[place]/thinking-about-suicide/find-a-crisis-centre for a list of centers in [place]. °· Visit the following websites: °¨  National Suicide Prevention Lifeline: www.suicidepreventionlifeline.org °¨  Hopeline: www.hopeline.com °¨  American Foundation for Suicide Prevention: www.afsp.org °¨  The Trevor Project (for lesbian, gay, bisexual, transgender, or questioning youth): www.thetrevorproject.org °HOW CAN I HELP MYSELF FEEL BETTER? °· Promise yourself that you will not do anything drastic when you have suicidal feelings. Remember, there is hope. Many people have gotten through suicidal thoughts and feelings, and you will, too. You may  have gotten through them before, and this proves that you can get through them again. °· Let family, friends, teachers, or counselors know how you are feeling. Try not to isolate yourself from those who care about you. Remember, they will want to help you. Talk with someone every day, even if you do not feel sociable. Face-to-face conversation is best. °· Call a mental health professional and see one regularly. °· Visit your primary health care provider every year. °· Eat a well-balanced diet, and space your meals so you eat regularly. °· Get plenty of rest. °· Avoid alcohol and drugs, and remove them from your home. They will only make you feel worse. °· If you are thinking of taking a lot of medicine, give your medicine to someone who can give it to you one day at a time. If you are on antidepressants and are concerned you will overdose, let your health care provider know so he or she can give you safer medicines. Ask your mental health professional about the possible side effects of any medicines you are taking. °· Remove weapons, poisons, knives, and anything else that could harm you from your home. °· Try to stick to routines. Follow a schedule every day. Put self-care on your schedule. °· Make a list of realistic goals, and cross them off when you achieve them. Accomplishments give a sense of worth. °· Wait until you are feeling better before doing the things you find difficult or unpleasant. °· Exercise if you are able. You will feel better if you exercise for even a half hour each day. °· Go out in the sun or into nature. This will help you recover from depression faster. If you have a favorite place to walk, go there. °· Do the things that have always given you pleasure. Play your favorite music, read a good book, paint a picture, play your favorite instrument, or do anything   else that takes your mind off your depression if it is safe to do. °· Keep your living space well lit. °· When you are feeling well,  write yourself a letter about tips and support that you can read when you are not feeling well. °· Remember that life's difficulties can be sorted out with help. Conditions can be treated. You can work on thoughts and strategies that serve you well. °  °This information is not intended to replace advice given to you by your health care provider. Make sure you discuss any questions you have with your health care provider. °  °Document Released: 03/15/2003 Document Revised: 09/29/2014 Document Reviewed: 01/03/2014 °Elsevier Interactive Patient Education ©2016 [employer] ° °

## 2015-11-23 NOTE — Progress Notes (Signed)
[facility]. AC/erick informed CSW that patient cannot be considered until psychiatric assessment is complete.  CSW made psychiatrist aware.   Trish MageBrittney Kayveon Lennartz, LCSWA [phone] ED CSW 11/23/2015 5:01 PM
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
## 2015-11-24 ENCOUNTER — Inpatient Hospital Stay (HOSPITAL_COMMUNITY)
Admission: AD | Admit: 2015-11-24 | Discharge: 2015-11-27 | DRG: 881 | Disposition: A | Discharge status: Home / Self Care | Payer: BLUE CROSS/BLUE SHIELD | Source: Intra-hospital | Attending: Psychiatry | Admitting: Psychiatry

## 2015-11-24 ENCOUNTER — Encounter (HOSPITAL_COMMUNITY): Payer: Self-pay

## 2015-11-24 DIAGNOSIS — Z8673 Personal history of transient ischemic attack (TIA), and cerebral infarction without residual deficits: Secondary | ICD-10-CM

## 2015-11-24 DIAGNOSIS — I4891 Unspecified atrial fibrillation: Secondary | ICD-10-CM | POA: Diagnosis present

## 2015-11-24 DIAGNOSIS — Z800 Family history of malignant neoplasm of digestive organs: Secondary | ICD-10-CM | POA: Diagnosis not present

## 2015-11-24 DIAGNOSIS — F32A Depression, unspecified: Secondary | ICD-10-CM | POA: Diagnosis present

## 2015-11-24 DIAGNOSIS — Z90710 Acquired absence of both cervix and uterus: Secondary | ICD-10-CM | POA: Diagnosis not present

## 2015-11-24 DIAGNOSIS — Z8249 Family history of ischemic heart disease and other diseases of the circulatory system: Secondary | ICD-10-CM

## 2015-11-24 DIAGNOSIS — Z79899 Other long term (current) drug therapy: Secondary | ICD-10-CM

## 2015-11-24 DIAGNOSIS — Z818 Family history of other mental and behavioral disorders: Secondary | ICD-10-CM

## 2015-11-24 DIAGNOSIS — K219 Gastro-esophageal reflux disease without esophagitis: Secondary | ICD-10-CM | POA: Diagnosis present

## 2015-11-24 DIAGNOSIS — F411 Generalized anxiety disorder: Secondary | ICD-10-CM | POA: Diagnosis present

## 2015-11-24 DIAGNOSIS — Z7901 Long term (current) use of anticoagulants: Secondary | ICD-10-CM | POA: Diagnosis not present

## 2015-11-24 DIAGNOSIS — M797 Fibromyalgia: Secondary | ICD-10-CM | POA: Diagnosis present

## 2015-11-24 DIAGNOSIS — G894 Chronic pain syndrome: Secondary | ICD-10-CM | POA: Diagnosis present

## 2015-11-24 DIAGNOSIS — Z808 Family history of malignant neoplasm of other organs or systems: Secondary | ICD-10-CM

## 2015-11-24 DIAGNOSIS — E785 Hyperlipidemia, unspecified: Secondary | ICD-10-CM | POA: Diagnosis present

## 2015-11-24 DIAGNOSIS — Z811 Family history of alcohol abuse and dependence: Secondary | ICD-10-CM

## 2015-11-24 DIAGNOSIS — Z915 Personal history of self-harm: Secondary | ICD-10-CM

## 2015-11-24 DIAGNOSIS — F329 Major depressive disorder, single episode, unspecified: Principal | ICD-10-CM | POA: Diagnosis present

## 2015-11-24 DIAGNOSIS — Z825 Family history of asthma and other chronic lower respiratory diseases: Secondary | ICD-10-CM

## 2015-11-24 DIAGNOSIS — T426X2A Poisoning by other antiepileptic and sedative-hypnotic drugs, intentional self-harm, initial encounter: Secondary | ICD-10-CM | POA: Diagnosis not present

## 2015-11-24 DIAGNOSIS — G4700 Insomnia, unspecified: Secondary | ICD-10-CM | POA: Diagnosis present

## 2015-11-24 DIAGNOSIS — T424X2A Poisoning by benzodiazepines, intentional self-harm, initial encounter: Secondary | ICD-10-CM | POA: Diagnosis not present

## 2015-11-24 DIAGNOSIS — T391X2A Poisoning by 4-Aminophenol derivatives, intentional self-harm, initial encounter: Secondary | ICD-10-CM | POA: Diagnosis not present

## 2015-11-24 DIAGNOSIS — T1491 Suicide attempt: Secondary | ICD-10-CM | POA: Diagnosis not present

## 2015-11-24 DIAGNOSIS — T50902A Poisoning by unspecified drugs, medicaments and biological substances, intentional self-harm, initial encounter: Secondary | ICD-10-CM | POA: Insufficient documentation

## 2015-11-24 LAB — COMPREHENSIVE METABOLIC PANEL
ALT: 12 U/L — ABNORMAL LOW (ref 14–54)
ANION GAP: 8 (ref 5–15)
AST: 17 U/L (ref 15–41)
Albumin: 4.1 g/dL (ref 3.5–5.0)
Alkaline Phosphatase: 58 U/L (ref 38–126)
BUN: 6 mg/dL (ref 6–20)
CALCIUM: 9.5 mg/dL (ref 8.9–10.3)
CHLORIDE: 110 mmol/L (ref 101–111)
CO2: 27 mmol/L (ref 22–32)
Creatinine, Ser: 0.69 mg/dL (ref 0.44–1.00)
GFR calc non Af Amer: >60 mL/min (ref >60)
Glucose, Bld: 112 mg/dL — ABNORMAL HIGH (ref 65–99)
POTASSIUM: 4.1 mmol/L (ref 3.5–5.1)
SODIUM: 145 mmol/L (ref 135–145)
Total Bilirubin: 1 mg/dL (ref 0.3–1.2)
Total Protein: 6.8 g/dL (ref 6.5–8.1)

## 2015-11-24 MED ORDER — ATORVASTATIN CALCIUM 20 MG PO TABS
20.0000 mg | ORAL_TABLET | Freq: Every day | ORAL | Status: DC
  Administered 2015-11-25 – 2015-11-27 (×3): 20 mg via ORAL
  Filled 2015-11-24 (×2): qty 1
  Filled 2015-11-24: qty 2
  Filled 2015-11-24 (×3): qty 1

## 2015-11-24 MED ORDER — MIRTAZAPINE 30 MG PO TABS
30.0000 mg | ORAL_TABLET | Freq: Every day | ORAL | Status: DC
  Administered 2015-11-24: 30 mg via ORAL
  Filled 2015-11-24 (×3): qty 1

## 2015-11-24 MED ORDER — IBUPROFEN 600 MG PO TABS
600.0000 mg | ORAL_TABLET | Freq: Once | ORAL | Status: AC
  Administered 2015-11-24: 600 mg via ORAL
  Filled 2015-11-24: qty 1

## 2015-11-24 MED ORDER — ALUM & MAG HYDROXIDE-SIMETH 200-200-20 MG/5ML PO SUSP: 30.0000 mL | ORAL | Status: DC | PRN

## 2015-11-24 MED ORDER — CETYLPYRIDINIUM CHLORIDE 0.05 % MT LIQD
7.0000 mL | Freq: Two times a day (BID) | OROMUCOSAL | Status: DC
  Administered 2015-11-24: 7 mL via OROMUCOSAL

## 2015-11-24 MED ORDER — IBUPROFEN 600 MG PO TABS
600.0000 mg | ORAL_TABLET | Freq: Four times a day (QID) | ORAL | Status: DC | PRN
  Filled 2015-11-24: qty 1

## 2015-11-24 MED ORDER — NITROFURANTOIN MONOHYD MACRO 100 MG PO CAPS
100.0000 mg | ORAL_CAPSULE | Freq: Two times a day (BID) | ORAL | Status: DC
  Administered 2015-11-24 – 2015-11-27 (×6): 100 mg via ORAL
  Filled 2015-11-24 (×12): qty 1

## 2015-11-24 MED ORDER — CHLORHEXIDINE GLUCONATE 0.12 % MT SOLN
15.0000 mL | Freq: Two times a day (BID) | OROMUCOSAL | Status: DC
Start: 2015-11-24 — End: 2015-11-24
  Administered 2015-11-24: 15 mL via OROMUCOSAL
  Filled 2015-11-24 (×2): qty 15

## 2015-11-24 MED ORDER — PANTOPRAZOLE SODIUM 40 MG PO TBEC
40.0000 mg | DELAYED_RELEASE_TABLET | Freq: Every day | ORAL | Status: DC
  Administered 2015-11-25 – 2015-11-27 (×3): 40 mg via ORAL
  Filled 2015-11-24 (×6): qty 1

## 2015-11-24 MED ORDER — ACETAMINOPHEN 325 MG PO TABS: 650.0000 mg | ORAL_TABLET | Freq: Four times a day (QID) | ORAL | Status: DC | PRN

## 2015-11-24 MED ORDER — FLECAINIDE ACETATE 100 MG PO TABS
100.0000 mg | ORAL_TABLET | Freq: Two times a day (BID) | ORAL | Status: DC
  Administered 2015-11-24 – 2015-11-27 (×6): 100 mg via ORAL
  Filled 2015-11-24 (×12): qty 1

## 2015-11-24 MED ORDER — MAGNESIUM HYDROXIDE 400 MG/5ML PO SUSP: 30.0000 mL | Freq: Every day | ORAL | Status: DC | PRN

## 2015-11-24 MED ORDER — LORAZEPAM 1 MG PO TABS
1.0000 mg | ORAL_TABLET | Freq: Two times a day (BID) | ORAL | Status: DC | PRN
  Administered 2015-11-25: 1 mg via ORAL
  Filled 2015-11-24: qty 1

## 2015-11-24 MED ORDER — KETOROLAC TROMETHAMINE 30 MG/ML IJ SOLN
30.0000 mg | Freq: Once | INTRAMUSCULAR | Status: AC
  Administered 2015-11-24: 30 mg via INTRAVENOUS
  Filled 2015-11-24: qty 1

## 2015-11-24 MED ORDER — RIVAROXABAN 20 MG PO TABS
20.0000 mg | ORAL_TABLET | Freq: Every day | ORAL | Status: DC
  Administered 2015-11-25 – 2015-11-26 (×2): 20 mg via ORAL
  Filled 2015-11-24 (×5): qty 1

## 2015-11-24 NOTE — Progress Notes (Signed)
Patient ID: Danielle Rocha, female   DOB: 12/01/1950, 65 y.o.   MRN: [id number] Per State regulations 482.30 this chart was reviewed for medical necessity with respect to the patient's admission/duration of stay.    Next review date: 11/28/15  Thurman CoyerEric Nobie Alleyne, BSN, RN-BC  Case Manager

## 2015-11-24 NOTE — Progress Notes (Signed)
Patient ID: Danielle Rocha, female   DOB: 06/21/1951, 65 y.o.   MRN: [id number]  [facility] due to an intentional drug overdose.  Patient was safety searched and skin assessment complete.  Patient was noted to have several large bruises on her left hip due to falling as a result of the overdose.  Patient also has bruising bilaterally in the anecubital fossa due to several IV placements in her arm.  Right knee was noted to be swollen s/p fall at home due to drug overdose.  The knee was x-rayed during medical admission and nothing significant was found.  Patient currently denies SI, HI and AVH and has acclimated to the unit well.

## 2015-11-24 NOTE — Tx Team (Signed)
Initial Interdisciplinary Treatment Plan   PATIENT STRESSORS: has been concerned about the sale of her parents estate and finds it overwhelming      PATIENT STRENGTHS: Ability for insight Average or above average intelligence Capable of independent living MetallurgistCommunication skills Financial means General fund of knowledge Religious Affiliation Special hobby/interest Supportive family/friends Work skills   PROBLEM LIST: Problem List/Patient Goals Date to be addressed Date deferred Reason deferred Estimated date of resolution  "Intentional Overdose" 11-24-2015     "feeling overwhelmed" 11-24-2015                                                DISCHARGE CRITERIA:  Ability to meet basic life and health needs Improved stabilization in mood, thinking, and/or behavior Motivation to continue treatment in a less acute level of care Reduction of life-threatening or endangering symptoms to within safe limits  PRELIMINARY DISCHARGE PLAN: Return to previous living arrangement  PATIENT/FAMIILY INVOLVEMENT: This treatment plan has been presented to and reviewed with the patient, Danielle DoorChristy Rocha.  The patient and family have been given the opportunity to ask questions and make suggestions.  Jerrye BushyLaRonica R Princetta Uplinger 11/24/2015, 5:55 PM

## 2015-11-24 NOTE — Progress Notes (Signed)
Pt seen and examined at the bedside Stable for discharge Please refer to D/C summary done  11/23/2015 No changes in medical management since 11/23/2015  Danielle Rocha [facility] [phone]

## 2015-11-24 NOTE — Progress Notes (Addendum)
CSW spoke with Inetta Fermoina [facility]. She will review pt for possible admission and contact CSW once a decision has been made.  Cori RazorJamie Sue Mcalexander LCSW [phone]   1410 Contacted by Inetta Fermoina [facility]( [facility]. Pt signed voluntary consent form which [facility]. [place] [facility].   Cori RazorJamie Deloise Marchant LCSW [phone]

## 2015-11-24 NOTE — Progress Notes (Signed)
Adult Psychoeducational Group Note  Date:  11/24/2015 Time:  9:22 PM  Group Topic/Focus:  Wrap-Up Group:   The focus of this group is to help patients review their daily goal of treatment and discuss progress on daily workbooks.  Participation Level:  Minimal  Participation Quality:  Appropriate and Attentive  Affect:  Appropriate and Flat  Cognitive:  Appropriate  Insight: Appropriate and Good  Engagement in Group:  Engaged  Modes of Intervention:  Discussion  Additional Comments:  Pts were asked if they were able to accomplish their goal for the day and what they wanted to work on while they are here. Pt stated her goal was to move out of the hospital setting ([facility] Pt stated she felt very encouraged when things "fell into place." Pt is glad to be here.  Caswell Corwinwen, Danielle Rocha 11/24/2015, 9:22 PM

## 2015-11-24 NOTE — Progress Notes (Signed)
Pt discharged to behavioral health.  Stable.  Denies pain.  Denies thoughts of wanting to harm herself.  Family at bedside.  Transported using transportation service.  [facility].  Pt had all belongings.  No other concerns at this time.  Barrie LymeVance, Elaysia Devargas E RN 11/24/2015 1353

## 2015-11-25 DIAGNOSIS — T424X2A Poisoning by benzodiazepines, intentional self-harm, initial encounter: Secondary | ICD-10-CM

## 2015-11-25 DIAGNOSIS — T426X2A Poisoning by other antiepileptic and sedative-hypnotic drugs, intentional self-harm, initial encounter: Secondary | ICD-10-CM

## 2015-11-25 DIAGNOSIS — T1491 Suicide attempt: Secondary | ICD-10-CM

## 2015-11-25 DIAGNOSIS — T50902A Poisoning by unspecified drugs, medicaments and biological substances, intentional self-harm, initial encounter: Secondary | ICD-10-CM | POA: Insufficient documentation

## 2015-11-25 MED ORDER — ACETAMINOPHEN 325 MG PO TABS
650.0000 mg | ORAL_TABLET | Freq: Once | ORAL | Status: DC
  Filled 2015-11-25: qty 2

## 2015-11-25 MED ORDER — IBUPROFEN 600 MG PO TABS
600.0000 mg | ORAL_TABLET | Freq: Three times a day (TID) | ORAL | Status: DC | PRN
Start: 2015-11-25 — End: 2015-11-25

## 2015-11-25 MED ORDER — ACETAMINOPHEN 325 MG PO TABS
650.0000 mg | ORAL_TABLET | Freq: Once | ORAL | Status: AC | PRN
  Administered 2015-11-25: 650 mg via ORAL
  Filled 2015-11-25: qty 2

## 2015-11-25 MED ORDER — LIDOCAINE 5 % EX PTCH
1.0000 | MEDICATED_PATCH | Freq: Every day | CUTANEOUS | Status: DC
  Administered 2015-11-25 – 2015-11-26 (×2): 1 via TRANSDERMAL
  Filled 2015-11-25 (×5): qty 1

## 2015-11-25 MED ORDER — HYDROXYZINE HCL 50 MG PO TABS
50.0000 mg | ORAL_TABLET | Freq: Once | ORAL | Status: AC
  Administered 2015-11-25: 50 mg via ORAL
  Filled 2015-11-25 (×2): qty 1

## 2015-11-25 MED ORDER — MIRTAZAPINE 45 MG PO TABS
45.0000 mg | ORAL_TABLET | Freq: Every day | ORAL | Status: DC
  Administered 2015-11-25 – 2015-11-26 (×2): 45 mg via ORAL
  Filled 2015-11-25 (×5): qty 1
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
## 2015-11-25 NOTE — H&P (Signed)
Psychiatric Admission Assessment Adult  Patient Identification: Danielle Rocha MRN:  [id number] Date of Evaluation:  11/25/2015 Chief Complaint:   " I took an overdose, I felt suicidal " Principal Diagnosis: Suicidal Attempt  Diagnosis:   Patient Active Problem List   Diagnosis Date Noted  . Suicidal overdose ([place]) [T50.902A] 11/22/2015  . Suicidal intent [R45.851] 11/22/2015  . Acute kidney injury ([place]) [N17.9] 11/22/2015  . Atrial fibrillation ([place]) [I48.91]   . Chronic pain syndrome [G89.4]   . Depression [F32.9]    History of Present Illness::  65 year old female. Reports worsening depression , and had been experiencing suicidal ideations for 1-2 weeks before her overdose.  On 11/22/15, she overdosed on Ativan and Ambien - does not remember quantity, after which she was brought to the hospital. She has very little very recollection of event. States " I think my husband heard me fall after taking the medications and brought me to the hospital". She states she has been feeling depressed, which she attributes partially to significant stressors . She had recently been facing increased stressors, mainly trying to deal with her mother's estate.  She was initially admitted to the medical unit , and on medical clearance was transferred to psychiatric unit . Associated Signs/Symptoms: Depression Symptoms:  depressed mood, insomnia, difficulty concentrating, recurrent thoughts of death, suicidal attempt, loss of energy/fatigue, decreased appetite, (Hypo) Manic Symptoms:  Denies  Anxiety Symptoms: anxious ruminations , including ruminating about mother's estate issues . Psychotic Symptoms:   Denies  PTSD Symptoms: Denies  Total Time spent with patient: 45 minutes  Past Psychiatric History: No prior psychiatric admissions, no prior suicidal attempts, no history of self cutting, no history of explosiveness, violence. Denies history of mania , denies history of psychosis.  Patient states she has  a history of depression, initially related to chronic pain and states she has been on several different psychiatric medications over the years.   Is the patient at risk to self? Yes.    Has the patient been a risk to self in the past 6 months? Yes.    Has the patient been a risk to self within the distant past? No.  Is the patient a risk to others? No.  Has the patient been a risk to others in the past 6 months? No.  Has the patient been a risk to others within the distant past? No.   Prior Inpatient Therapy:  denies  Prior Outpatient Therapy:  Follows up at Dr. Tomasita Crumble [facility] office .   Alcohol Screening: 1. How often do you have a drink containing alcohol?: Never 9. Have you or someone else been injured as a result of your drinking?: No 10. Has a relative or friend or a doctor or another health worker been concerned about your drinking or suggested you cut down?: No Alcohol Use Disorder Identification Test Final Score (AUDIT): 0 Brief Intervention: AUDIT score less than 7 or less-screening does not suggest unhealthy drinking-brief intervention not indicated Substance Abuse History in the last 12 months:   Denies alcohol, denies drug abuse. Has a history of being prescribed BZDs, Ambien, Narcotics , denies abusing them . Consequences of Substance Abuse: Denies  Previous Psychotropic Medications:  Most recent medications included Neurontin, Ativan which was prescribed 1 mgr TID but which she states she took only twice a week on a PRN basis,  Remeron , Latuda, Lamictal, Ambien . In the past she has tried Cymbalta which caused nausea, and also remembers being on Zoloft in the past .  Psychological Evaluations:  No  Past Medical History: States she had an A fib 5 years ago. Does not smoke  Past Medical History  Diagnosis Date  . Arthritis     DJD, low back, thumb  . Chicken pox   . Depression     zoloft 117m, remeron 38mper psychiatry. ambien 10105mer psychiatry to help wtih sleep  element.   . GMarland KitchenRD (gastroesophageal reflux disease)     omeprazole OTC  . Atrial fibrillation (HCC)     Xarelto anticoagulation. Flecainide antiarrythmic   . Hyperlipidemia     lovastatin 76m59m Colon polyp     awaiting records  . Goiter     states multiple imaging tests, has had biopsies  . Dystonia     described as psychogenic dystonia. Pain and twisting from upper chest and up ith triggers "wind, creamy food" on TID ativan per psychiatry previously.   . Vaginal atrophy     estrace vaginal cream  . Chronic pain syndrome     On disability. History of bilateral hip pain, low back pain. Gabapentin 400 BID, percocet 1 tablet daily per prior provider.   . Fibromyalgia   . TIA (transient ischemic attack)   . [facility]  TIA (left side of face and body decreased sensitivity than right face and side)    Past Surgical History  Procedure Laterality Date  . Piriformis release  1999  . Fusion l4-l5  2000  . Tonsillectomy and adenoidectomy  age 68  .57Vaginal hysterectomy  1993  . Other surgical history      tennis elbow surgery  . Biopsy thyroid    . Laminectomy      L4-L5  . Atrial fibrillation ablation     Family History:  Mother died 8 ye70rs ago , from COPD, father died from pancreatic cancer 20 y37rs ago, has three siblings Family History  Problem Relation Age of Onset  . Alcohol abuse Mother   . Hypertension Father   . Alcohol abuse Father   . Pancreatic cancer Father   . Cancer Brother     spindle cell RLE  . Cancer Brother     tonsil cancer  . Skin cancer Brother    Family Psychiatric  History:  Mother had history of depression and alcohol dependence , sister has history of alcohol dependence, one maternal uncle committed suicide   Tobacco Screening: does not smoke  Social History:  Married 31 y36rs, has three adult children, has one son who has Down Syndrome and lives with her and husband, unemployed, on disability, denies legal issues. As above, states that  handling mother's estate was very stressful, states she is relieved that her brother will now take over this . History  Alcohol Use No     History  Drug Use No    Additional Social History: Marital status: Married Number of Years Married: 31 W32t types of issues is patient dealing with in the relationship?: Husband gets angry and she is trying to learn not to "fix" him. Are you sexually active?: Yes What is your sexual orientation?: Straight Has your sexual activity been affected by drugs, alcohol, medication, or emotional stress?: Physical restrictions Does patient have children?: Yes How many children?: 3 How is patient's relationship with their children?: [place], improving relationship since he stopped heroin in the last year.  Middle son - is very close, [id number] is youngest, has  Down's Syndrome and lives with them, very close.  Allergies:   Allergies  Allergen Reactions  . Fish Allergy Anaphylaxis    Shell Fish   . Reglan [Metoclopramide] Other (See Comments)    Suicidal thoughts  . Ciprofloxacin Diarrhea  . Clindamycin/Lincomycin Diarrhea  . Vibramycin [Doxycycline] Diarrhea  . Sulfa Antibiotics Rash   Lab Results:  Results for orders placed or performed during the hospital encounter of 11/22/15 (from the past 48 hour(s))  Comprehensive metabolic panel     Status: Abnormal   Collection Time: 11/24/15  6:22 AM  Result Value Ref Range   Sodium 145 135 - 145 mmol/L   Potassium 4.1 3.5 - 5.1 mmol/L   Chloride 110 101 - 111 mmol/L   CO2 27 22 - 32 mmol/L   Glucose, Bld 112 (H) 65 - 99 mg/dL   BUN 6 6 - 20 mg/dL   Creatinine, Ser 0.69 0.44 - 1.00 mg/dL   Calcium 9.5 8.9 - 10.3 mg/dL   Total Protein 6.8 6.5 - 8.1 g/dL   Albumin 4.1 3.5 - 5.0 g/dL   AST 17 15 - 41 U/L   ALT 12 (L) 14 - 54 U/L   Alkaline Phosphatase 58 38 - 126 U/L   Total Bilirubin 1.0 0.3 - 1.2 mg/dL   GFR calc non Af Amer >60 >60 mL/min   GFR  calc Af Amer >60 >60 mL/min    Comment: (NOTE) The eGFR has been calculated using the CKD EPI equation. This calculation has not been validated in all clinical situations. eGFR's persistently <60 mL/min signify possible Chronic Kidney Disease.    Anion gap 8 5 - 15    Blood Alcohol level:  No results found for: [facility]  Metabolic Disorder Labs:  No results found for: HGBA1C, MPG No results found for: PROLACTIN No results found for: CHOL, TRIG, HDL, CHOLHDL, VLDL, LDLCALC  Current Medications: Current Facility-Administered Medications  Medication Dose Route Frequency Provider Last Rate Last Dose  . alum & mag hydroxide-simeth (MAALOX/MYLANTA) 200-200-20 MG/5ML suspension 30 mL  30 mL Oral Q4H PRN Derrill Center, NP      . atorvastatin (LIPITOR) tablet 20 mg  20 mg Oral Daily Derrill Center, NP   20 mg at 11/25/15 0800  . flecainide (TAMBOCOR) tablet 100 mg  100 mg Oral BID Derrill Center, NP   100 mg at 11/25/15 0801  . LORazepam (ATIVAN) tablet 1 mg  1 mg Oral BID PRN Lurena Nida, NP   1 mg at 11/25/15 0804  . magnesium hydroxide (MILK OF MAGNESIA) suspension 30 mL  30 mL Oral Daily PRN Derrill Center, NP      . mirtazapine (REMERON) tablet 30 mg  30 mg Oral QHS Derrill Center, NP   30 mg at 11/24/15 2152  . nitrofurantoin (macrocrystal-monohydrate) (MACROBID) capsule 100 mg  100 mg Oral Q12H Lurena Nida, NP   100 mg at 11/25/15 0800  . pantoprazole (PROTONIX) EC tablet 40 mg  40 mg Oral Daily Derrill Center, NP   40 mg at 11/25/15 0801  . rivaroxaban (XARELTO) tablet 20 mg  20 mg Oral Q supper Derrill Center, NP   20 mg at 11/24/15 1829   PTA Medications: Prescriptions prior to admission  Medication Sig Dispense Refill Last Dose  . atorvastatin (LIPITOR) 20 MG tablet Take 20 mg by mouth daily.   11/21/2015 at Unknown time  . cyclobenzaprine (FLEXERIL) 5 MG tablet Take 1 tablet (5 mg total)  by mouth 3 (three) times daily as needed for muscle spasms. 30 tablet 1 Past Week at Unknown  time  . flecainide (TAMBOCOR) 100 MG tablet Take 1 tablet (100 mg total) by mouth 2 (two) times daily. 120 tablet 3 11/22/2015 at Unknown time  . LORazepam (ATIVAN) 1 MG tablet Take 1 mg by mouth 3 (three) times daily as needed (for dystonia reactions).    11/22/2015 at Unknown time  . mirtazapine (REMERON) 30 MG tablet Take 30 mg by mouth at bedtime.   11/21/2015 at Unknown time  . omeprazole (PRILOSEC) 20 MG capsule Take 1 capsule (20 mg total) by mouth 2 (two) times daily before a meal. 60 capsule 6 11/22/2015 at Unknown time  . rivaroxaban (XARELTO) 20 MG TABS tablet Take 1 tablet (20 mg total) by mouth daily with supper. 90 tablet 3 11/21/2015 at 1700    Musculoskeletal: Strength & Muscle Tone: within normal limits Gait & Station: normal Patient leans: N/A  Psychiatric Specialty Exam: Physical Exam  Review of Systems  Constitutional: Negative.   HENT: Negative.   Eyes: Negative.   Respiratory: Negative.   Cardiovascular: Negative.   Gastrointestinal: Positive for nausea. Negative for vomiting and blood in stool.  Genitourinary: Positive for frequency.  Musculoskeletal:       Bilateral hip pain  Skin: Negative.   Neurological: Negative for seizures.  Endo/Heme/Allergies: Bruises/bleeds easily.  Psychiatric/Behavioral: Positive for depression and suicidal ideas.  All other systems reviewed and are negative.   Blood pressure 116/73, pulse 85, temperature 97.8 F (36.6 C), temperature source Oral, resp. rate 16, height 5' 7"  (1.702 m), weight 154 lb (69.854 kg), SpO2 98 %.Body mass index is 24.11 kg/(m^2).  General Appearance: Well Groomed  Engineer, water::  Good  Speech:  Normal Rate  Volume:  Normal  Mood:  Depressed, but states feeling better, and " happy that I am alive "  Affect:  constricted, but reactive   Thought Process:  Goal Directed  Orientation:  Full (Time, Place, and Person)  Thought Content:  no hallucinations, no delusions, not internally preoccupied   Suicidal  Thoughts:  No at this time denies any suicidal ideations, denies any self injurious ideations  Homicidal Thoughts:  No  Memory:  recent and remote grossly intact   Judgement:  Other:  improved   Insight:  Fair  Psychomotor Activity:  Normal  Concentration:  Good  Recall:  Good  Fund of Knowledge:Good  Language: Good  Akathisia:  Negative  Handed:  Right  AIMS (if indicated):     Assets:  Desire for Improvement Resilience  ADL's:  Intact  Cognition: WNL  Sleep:  Number of Hours: 5.25     Treatment Plan Summary: Daily contact with patient to assess and evaluate symptoms and progress in treatment, Medication management, Plan inpatient admission and medications as below  Observation Level/Precautions:  15 minute checks  Laboratory: TSH   Psychotherapy:  Milieu, support    Medications:  Agrees to titrate REMERON to 45 mgrs QHS for depression. At this time interested in not restarting opiates, ambien or other potentially addictive medication.   Consultations:  As needed   Discharge Concerns: -    Estimated LOS: 5  Days   Other:     I certify that inpatient services furnished can reasonably be expected to improve the patient's condition.    Neita Garnet, MD 3/5/201710:18 AM

## 2015-11-25 NOTE — Progress Notes (Signed)
D: Pt presents with appropriate affect and pleasant mood.  Pt reported feeling "a little jittery" at beginning of shift.  She stated she is "trying to get used to my surroundings" and her goal tonight is to "sleep."  When asked if she was having hallucinations, she reports she has seen "vivid black and white pictures the past 2 nights when I first shut my eyes."  Pt denies SI/HI, denies hallucinations, reports chronic bilateral hip pain of 4/10.  Pt has been visible in milieu and she attended evening group.  She reported having difficulty sleeping. A: Introduced self to pt.  Met with pt 1:1 and provided support and encouragement.  Actively listened to pt. Medications administered per order.  On-site provider notified that pt reported pain and difficulty sleeping.  One-time medications were ordered and administered for pain and sleep.  R: Pt is compliant with medications.  Pt verbally contracts for safety and reports that she will inform staff of needs and concerns.  Will continue to monitor and assess.

## 2015-11-25 NOTE — Plan of Care (Signed)
Problem: Diagnosis: Increased Risk For Suicide Attempt Goal: STG-Patient Will Attend All Groups On The Unit Outcome: Progressing Pt attended evening group on 11/24/15

## 2015-11-25 NOTE — Progress Notes (Signed)
Adult Psychoeducational Group Note  Date:  11/25/2015 Time:  0930 am  Group Topic/Focus:  Orientation:   The focus of this group is to educate the patient on the purpose and policies of crisis stabilization and provide a format to answer questions about their admission.  The group details unit policies and expectations of patients while admitted.  Participation Level:  Active  Participation Quality:  Appropriate  Affect:  Appropriate  Cognitive:  Appropriate  Insight: Appropriate  Engagement in Group:  Engaged  Modes of Intervention:  Discussion and Orientation  Additional Comments:    Bahja Bence L 11/25/2015, 3:17 PM

## 2015-11-25 NOTE — Progress Notes (Signed)
D: Pt presents with flat affect and depressed mood. Pt rates depression 2/10. Anxiety 3/10. Hopeless 0. Pt denies suicidal thoughts. Pt reported only having one hour of sleep last night due to nightmares. Pt verbalized having relief today from not having suicidal thoughts. Pt verbally contracts for safety. Pt c/o right hip and knee pain from prior fall. Hillery Jacksanika Lewis, NP., made aware. Pt ordered a one time dose of tylenol and a lidocaine patch. Pt compliant with taking meds and attending groups.  A: Medications administered as ordered per MD. Medications reviewed with pt. Verbal support provided. Pt encouraged to attend groups. 15 minute checks performed for safety. R: Pt stated goal "discover the reason for hurting myself". Patient verbalized understanding of med regimen. Pt receptive to tx.

## 2015-11-25 NOTE — Progress Notes (Signed)
Adult Psychoeducational Group Note  Date:  11/25/2015 Time:  9:41 PM  Group Topic/Focus:  Wrap-Up Group:   The focus of this group is to help patients review their daily goal of treatment and discuss progress on daily workbooks.  Participation Level:  Active  Participation Quality:  Appropriate  Affect:  Appropriate  Cognitive:  Alert  Insight: Appropriate  Engagement in Group:  Engaged  Modes of Intervention:  Discussion  Additional Comments:  Patient goal for today was to figure out why she wanted to hurt herself. On a scale between 1-10, (1=worse, 10=best) patient rated her day a 9 because "I had a good day".  Danielle Rocha [name] 11/25/2015, 9:41 PM

## 2015-11-25 NOTE — [facility] Group Notes (Signed)
[facility] LCSW Group Therapy  11/25/2015 11:00 AM  Type of Therapy:  Group Therapy: Feelings Around Returning Home & Establishing a Supportive Framework and Activity to Identify signs of Improvement or Decompensation   Participation Level:  Active  Participation Quality:  Appropriate, Attentive and Sharing  Affect:  Appropriate  Cognitive:  Appropriate  Insight:  Engaged  Engagement in Therapy:  Engaged  Modes of Intervention:  Activity, Clarification, Discussion, Socialization and Support  Summary of Progress/Problems:   Pt engaged easily during group session. As patients processed their anxiety about discharge and described healthy supports patient  Shared that she has multiple supports she has been avoiding. Patient chose a visual to represent decompensation as "being tied up in knots of my own creation and stuck" and improvement as the shore at sunrise to represent feeling grounded.   Carney Bernatherine C Ben Habermann, LCSW

## 2015-11-25 NOTE — [facility] Suicide Risk Assessment (Signed)
[facility]   Nursing information obtained from:   patient and chart  Demographic factors:   65 year old married female  Current Mental Status:   see below Loss Factors:   chronic pain history Historical Factors:   history of depression Risk Reduction Factors:   resilience, sense of responsibility to family   Total Time spent with patient: 45 minutes  Principal Problem:  Suicidal attempt Diagnosis:   Patient Active Problem List   Diagnosis Date Noted  . Suicide attempt by drug ingestion (HCC) [T50.902A]   . Suicidal overdose (HCC) [T50.902A] 11/22/2015  . Suicidal intent [R45.851] 11/22/2015  . Acute kidney injury (HCC) [N17.9] 11/22/2015  . Atrial fibrillation (HCC) [I48.91]   . Chronic pain syndrome [G89.4]   . Depression [F32.9]      Continued Clinical Symptoms:  Alcohol Use Disorder Identification Test Final Score (AUDIT): 0 The "Alcohol Use Disorders Identification Test", Guidelines for Use in Primary Care, Second Edition.  World Science writerHealth Organization [facility](WHO). Score between 0-7:  no or low risk or alcohol related problems. Score between 8-15:  moderate risk of alcohol related problems. Score between 16-19:  high risk of alcohol related problems. Score 20 or above:  warrants further diagnostic evaluation for alcohol dependence and treatment.   CLINICAL FACTORS:  Patient is a 65 year old  married female , who has a history of depression. States she had been feeling more depressed over recent days to weeks and developed suicidal ideations. She attempted suicide by  Overdosing  on  Ambien and Ativan , which were prescribed to her.      Psychiatric Specialty Exam: ROS  Blood pressure 116/73, pulse 85, temperature 97.8 F (36.6 C), temperature source Oral, resp. rate 16, height 5\' 7"  (1.702 m), weight 154 lb (69.854 kg), SpO2 98 %.Body mass index is 24.11 kg/(m^2).  See admit note MSE                                                        COGNITIVE FEATURES THAT CONTRIBUTE TO RISK:  Closed-mindedness and Loss of executive function    SUICIDE RISK:   Moderate.  PLAN OF CARE: Patient will be admitted to inpatient psychiatric unit for stabilization and safety. Will provide and encourage milieu participation. Provide medication management and maked adjustments as needed.  Will follow daily.    I certify that inpatient services furnished can reasonably be expected to improve the patient's condition.   Nehemiah MassedOBOS, Delores Edelstein, MD 11/25/2015, 2:15 PM

## 2015-11-26 MED ORDER — LORAZEPAM 1 MG PO TABS
1.0000 mg | ORAL_TABLET | Freq: Two times a day (BID) | ORAL | Status: DC | PRN
  Administered 2015-11-26: 1 mg via ORAL
  Filled 2015-11-26: qty 1

## 2015-11-26 MED ORDER — HYDROXYZINE HCL 50 MG PO TABS: 50.0000 mg | ORAL_TABLET | Freq: Once | ORAL | Status: DC | PRN

## 2015-11-26 MED ORDER — HYDROXYZINE HCL 50 MG PO TABS
50.0000 mg | ORAL_TABLET | Freq: Once | ORAL | Status: AC
  Administered 2015-11-26: 50 mg via ORAL
  Filled 2015-11-26 (×2): qty 1

## 2015-11-26 MED ORDER — TRAZODONE HCL 50 MG PO TABS: 50.0000 mg | ORAL_TABLET | Freq: Every evening | ORAL | Status: DC | PRN

## 2015-11-26 NOTE — [facility] Group Notes (Signed)
[facility]  11/26/2015 8:45 AM  Participation Quality: Alert, Appropriate and Oriented  Mood/Affect: Flat  Depression Rating: 6  Anxiety Rating: 6  Thoughts of Suicide: Pt denies SI/HI  Will you contract for safety? Yes  Current AVH: Pt denies  Plan for Discharge/Comments: Pt attended discharge planning group and actively participated in group. CSW discussed suicide prevention education with the group and encouraged them to discuss discharge planning and any relevant barriers. Pt reports difficulty sleeping due to leg pain. She expressed that she feels that the sleeplessness is increasing her depression and anxiety.  Transportation Means: Pt reports access to transportation  Supports: No supports mentioned at this time  Chad CordialLauren Carter, LCSWA 11/26/2015 4:08 PM

## 2015-11-26 NOTE — Plan of Care (Signed)
Problem: Alteration in mood Goal: LTG-Patient reports reduction in suicidal thoughts (Patient reports reduction in suicidal thoughts and is able to verbalize a safety plan for whenever patient is feeling suicidal)  Outcome: Progressing Pt denies SI and verbally contracts for safety.       

## 2015-11-26 NOTE — Plan of Care (Signed)
Problem: Ineffective individual coping Goal: STG: Patient will remain free from self harm Outcome: Progressing Pt safe on the unit at this time     

## 2015-11-26 NOTE — Progress Notes (Signed)
Recreation Therapy Notes  Date: 03.06.2017 Time: 9:30am Location: [address] Group Room   Group Topic: Stress Management  Goal Area(s) Addresses:  Patient will actively participate in stress management techniques presented during session.   Behavioral Response: Appropriate, Engaged   Intervention: Stress management techniques  Activity :  Deep Breathing, Mindfulness and Mindful Breathing. LRT provided education, instruction and demonstration on practice of Deep Breathing, Mindfulness and Mindful Breathing. Patient was asked to participate in technique introduced during session.   Education:  Stress Management, Discharge Planning.   Education Outcome: Acknowledges education  Clinical Observations/Feedback: Patient actively engaged in technique introduced, expressed no concerns and demonstrated ability to practice independently post d/c.   Marykay Lexenise L Shriyan Arakawa, LRT/CTRS  Halona Amstutz L 11/26/2015 2:30 PM

## 2015-11-26 NOTE — Progress Notes (Signed)
D: Pt denies SI/HI/AVH. Pt is pleasant and cooperative. Pt concerned about her sleeping . Pt hopeful and very optimistic about the future. Pt excited about the reduction in number of pills she takes.   A: Pt was offered support and encouragement. Pt was given scheduled medications. Pt was encourage to attend groups. Q 15 minute checks were done for safety.   R:Pt attends groups and interacts well with peers and staff. Pt is taking medication. Pt has no complaints.Pt receptive to treatment and safety maintained on unit.

## 2015-11-26 NOTE — Progress Notes (Signed)
[facility] MD Progress Note  11/26/2015 11:40 AM Danielle Rocha  MRN:  [id number] Subjective:  Patient states she is feeling better today.  States she had a good visit from her family yesterday which helped her feel better. She states she is less depressed, and feeling " more hopeful". At this time does not endorse medication side effects. She states she is happy that medication regimen is being simplified, and states " I think I was on too many medications and that was part of the problem". She does express concern about ongoing insomnia Objective : I have discussed case with treatment team and have met with patient . Today she presents alert and attentive, well related, with improved mood and range of affect .  Denies any suicidal ideations . No disruptive or agitated behaviors on unit, pleasant on approach. Visible on unit, going to groups.  Principal Problem: Suicidal attempt  Diagnosis:   Patient Active Problem List   Diagnosis Date Noted  . Suicide attempt by drug ingestion ([place]) [T50.902A]   . Suicidal overdose ([place]) [T50.902A] 11/22/2015  . Suicidal intent [R45.851] 11/22/2015  . Acute kidney injury ([place]) [N17.9] 11/22/2015  . Atrial fibrillation ([place]) [I48.91]   . Chronic pain syndrome [G89.4]   . Depression [F32.9]    Total Time spent with patient: 25 minutes     Past Medical History:  Past Medical History  Diagnosis Date  . Arthritis     DJD, low back, thumb  . Chicken pox   . Depression     zoloft 149m, remeron 366mper psychiatry. ambien 1080mer psychiatry to help wtih sleep element.   . GMarland KitchenRD (gastroesophageal reflux disease)     omeprazole OTC  . Atrial fibrillation (HCC)     Xarelto anticoagulation. Flecainide antiarrythmic   . Hyperlipidemia     lovastatin 37m2m Colon polyp     awaiting records  . Goiter     states multiple imaging tests, has had biopsies  . Dystonia     described as psychogenic dystonia. Pain and twisting from upper chest and up ith  triggers "wind, creamy food" on TID ativan per psychiatry previously.   . Vaginal atrophy     estrace vaginal cream  . Chronic pain syndrome     On disability. History of bilateral hip pain, low back pain. Gabapentin 400 BID, percocet 1 tablet daily per prior provider.   . Fibromyalgia   . TIA (transient ischemic attack)   . [facility]  [facility] (left side of face and body decreased sensitivity than right face and side)    Past Surgical History  Procedure Laterality Date  . Piriformis release  1999  . Fusion l4-l5  2000  . Tonsillectomy and adenoidectomy  age 18  .59Vaginal hysterectomy  1993  . Other surgical history      tennis elbow surgery  . Biopsy thyroid    . Laminectomy      L4-L5  . Atrial fibrillation ablation     Family History:  Family History  Problem Relation Age of Onset  . Alcohol abuse Mother   . Hypertension Father   . Alcohol abuse Father   . Pancreatic cancer Father   . Cancer Brother     spindle cell RLE  . Cancer Brother     tonsil cancer  . Skin cancer Brother    Social History:  History  Alcohol Use No     History  Drug Use No    Social  History   Social History  . Marital Status: Married    Spouse Name: N/A  . Number of Children: N/A  . Years of Education: N/A   Social History Main Topics  . Smoking status: Never Smoker   . Smokeless tobacco: Never Used  . Alcohol Use: No  . Drug Use: No  . Sexual Activity: No   Other Topics Concern  . None   Social History Narrative   Family: Married. Husband works as Geophysicist/field seismologist for [employer] [place]. 2 children from previous marriage 1 from current. Son [name] lives with them and has down's syndrome.       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 Moved to [facility] [place] from [id number] and became disabled in that time frame due to hip/back issues.       Hobbies: time with son   Additional Social History:   Sleep: Fair  Appetite:  Good  Current Medications: Current  Facility-Administered Medications  Medication Dose Route Frequency Provider Last Rate Last Dose  . alum & mag hydroxide-simeth (MAALOX/MYLANTA) 200-200-20 MG/5ML suspension 30 mL  30 mL Oral Q4H PRN Derrill Center, NP      . atorvastatin (LIPITOR) tablet 20 mg  20 mg Oral Daily Derrill Center, NP   20 mg at 11/26/15 0759  . flecainide (TAMBOCOR) tablet 100 mg  100 mg Oral BID Derrill Center, NP   100 mg at 11/26/15 0759  . lidocaine (LIDODERM) 5 % 1 patch  1 patch Transdermal Daily Derrill Center, NP   1 patch at 11/26/15 0800  . LORazepam (ATIVAN) tablet 1 mg  1 mg Oral BID PRN Lurena Nida, NP   1 mg at 11/25/15 0804  . magnesium hydroxide (MILK OF MAGNESIA) suspension 30 mL  30 mL Oral Daily PRN Derrill Center, NP      . mirtazapine (REMERON) tablet 45 mg  45 mg Oral QHS Jenne Campus, MD   45 mg at 11/25/15 2125  . nitrofurantoin (macrocrystal-monohydrate) (MACROBID) capsule 100 mg  100 mg Oral Q12H Lurena Nida, NP   100 mg at 11/26/15 0759  . pantoprazole (PROTONIX) EC tablet 40 mg  40 mg Oral Daily Derrill Center, NP   40 mg at 11/26/15 0759  . rivaroxaban (XARELTO) tablet 20 mg  20 mg Oral Q supper Derrill Center, NP   20 mg at 11/25/15 1715    Lab Results: No results found for this or any previous visit (from the past 48 hour(s)).  Blood Alcohol level:  No results found for: [facility]  Physical Findings: AIMS:  , ,  ,  ,    CIWA:    COWS:     Musculoskeletal: Strength & Muscle Tone: within normal limits Gait & Station: normal Patient leans: N/A  Psychiatric Specialty Exam: ROS denies chest pain, denies shortness of breath, denies any active bleeding   Blood pressure 102/56, pulse 85, temperature 98.6 F (37 C), temperature source Oral, resp. rate 16, height 5' 7"  (1.702 m), weight 154 lb (69.854 kg), SpO2 98 %.Body mass index is 24.11 kg/(m^2).  General Appearance: Well Groomed  Engineer, water::  Good  Speech:  Normal Rate  Volume:  Normal  Mood:  improved, reports she feels  "OK" today, minimizes depression at this time  Affect:  more reactive, smiles often and appropriately   Thought Process:  Linear  Orientation:  Full (Time, Place, and Person)  Thought Content:  denies hallucinations, no delusions   Suicidal  Thoughts:  No at this time denies any suicidal ideations or any self injurious ideations   Homicidal Thoughts:  No  Memory:  recent and remote grossly intact   Judgement:  Other:  improving   Insight:  Improving   Psychomotor Activity:  Normal- no restlessness, no tremors, no agitation   Concentration:  Good  Recall:  Good  Fund of Knowledge:Good  Language: Negative  Akathisia:  Negative  Handed:  Right  AIMS (if indicated):     Assets:  Desire for Improvement Resilience Social Support  ADL's:  Intact  Cognition: WNL  Sleep:  Number of Hours: 3  Assessment - at this time patient is significantly improved , mood presents improved with a fuller range of affect . No SI. Tolerating medications well thus far. Sleep fair . Patient focused on simplifying medication regimen, to minimize use of potentially addictive medications/ medications that could be contributing to cognitive side effects.  * Trazodone  was considered for insomnia, but due to potential for QTc prolongation in association with patient's cardiac medications, we decided not to use this medication  Treatment Plan Summary: Daily contact with patient to assess and evaluate symptoms and progress in treatment, Medication management, Plan inpatient treatment  and medications as below  Encourage group, milieu participation to work on coping skills and symptom reduction Continue Remeron 45 mgrs QHS for depression, insomnia Continue Ativan 1 mgrs Q 12 hours PRN for anxiety or insomnia On Xarelto and Tambocor for history of A Fib  - denies side effects Tracen Mahler, MD 11/26/2015, 11:40 AM

## 2015-11-26 NOTE — Progress Notes (Signed)
D: Pt has depressed affect and pleasant mood.  She was visiting with her brother and her sister-in-law upon initial approach.  Pt reports she had a good visit with her family tonight.  She reports her goal today was "to find out if there could be some explanation for why I was thinking the thoughts I did."  Pt reports her day was "good, I was very impressed with the doctor, our meeting really helped me understand why I did what I did; I'm going from 12 medications to 5."  Pt reports she is happy to decrease the amount of medications she takes.  She denies SI/HI, denies hallucinations, reports bilateral hip pain of 6/10.  Pt has been visible in milieu interacting with peers and staff appropriately.  Pt attended evening group.   A: Met with pt and offered support and encouragement.  Actively listened to pt.  Medications administered per order.  X1 medication administered for pain.  Heat packs provided for pain.   R: Pt is compliant with medications.  Pt verbally contracts for safety.  Will continue to monitor and assess.   

## 2015-11-26 NOTE — Progress Notes (Signed)
Adult Psychoeducational Group Note  Date:  11/26/2015 Time:  9:25 PM  Group Topic/Focus:  Wrap-Up Group:   The focus of this group is to help patients review their daily goal of treatment and discuss progress on daily workbooks.  Participation Level:  Active  Participation Quality:  Appropriate  Affect:  Appropriate  Cognitive:  Alert  Insight: Appropriate  Engagement in Group:  Engaged  Modes of Intervention:  Discussion  Additional Comments:  Patient stated "I had a good day". Patient goal was to identify her stressors, and to work out her sleeping plan.  Andreah Goheen L Jaymari Cromie 11/26/2015, 9:25 PM
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
## 2015-11-26 NOTE — Progress Notes (Signed)
D: Pt presents anxious on approach. Pt reported feeling anxious and gittery this morning from poor sleep last night. Pt stated that she had restless leg syndrome after taking the  increase dose of Remeron last night. Pt reported that she had less than three hours of sleep last night. Pt stated that she was on Ambien in the past which worked well for her. Dr. Jama Flavorsobos made aware of pt request. Pt reported no depression or suicidal thoughts this morning. Pt goal is to be able to get a good nights rest tonight. Pt compliant with taking meds and attending groups. A: Medications administered as ordered per MD. Verbal support provided. Pt encouraged to talk to MD about taking Ambien at bedtime.  R: Pt receptive to tx.

## 2015-11-27 MED ORDER — MIRTAZAPINE 45 MG PO TABS: 45.0000 mg | ORAL_TABLET | Freq: Every day | ORAL | Status: DC

## 2015-11-27 MED ORDER — ATORVASTATIN CALCIUM 20 MG PO TABS: 20.0000 mg | ORAL_TABLET | Freq: Every day | ORAL | Status: DC

## 2015-11-27 MED ORDER — FLECAINIDE ACETATE 100 MG PO TABS: 100.0000 mg | ORAL_TABLET | Freq: Two times a day (BID) | ORAL | Status: DC

## 2015-11-27 MED ORDER — PANTOPRAZOLE SODIUM 40 MG PO TBEC: 40.0000 mg | DELAYED_RELEASE_TABLET | Freq: Every day | ORAL | Status: DC

## 2015-11-27 MED ORDER — RIVAROXABAN 20 MG PO TABS: 20.0000 mg | ORAL_TABLET | Freq: Every day | ORAL | Status: DC

## 2015-11-27 MED ORDER — HYDROXYZINE HCL 50 MG PO TABS: 50.0000 mg | ORAL_TABLET | Freq: Once | ORAL | Status: DC | PRN

## 2015-11-27 MED ORDER — NITROFURANTOIN MONOHYD MACRO 100 MG PO CAPS: 100.0000 mg | ORAL_CAPSULE | Freq: Two times a day (BID) | ORAL | Status: DC

## 2015-11-27 NOTE — [facility] Group Notes (Signed)
[facility] Group Notes:  (Nursing/MHT/Case Management/Adjunct)  Date:  11/27/2015  Time:  0900 am   Type of Therapy:  Recovery Group  Participation Level:  Active  Participation Quality:  Appropriate and Attentive  Affect:  Appropriate  Cognitive:  Alert and Appropriate  Insight:  Appropriate  Engagement in Group:  Improving  Modes of Intervention:  Support  Summary of Progress/Problems: Patient was able to identify at least coping skill she will use upon discharge.  Cranford MonBeaudry, Nijae Doyel Evans 11/27/2015, 10:49 AM

## 2015-11-27 NOTE — Progress Notes (Signed)
  [facility] :  Will you be returning to the same living situation after discharge:  Yes,  Pt returning home At discharge, do you have transportation home?: Yes,  Pt husband to pick up Do you have the ability to pay for your medications: Yes,  Pt provided with prescriptions  Release of information consent forms completed and in the chart;  Patient's signature needed at discharge.  Patient to Follow up at: Follow-up Information    [facility].   Why:  at 11:15am for medication management with Lauris PoagLeslie O'Neal.   Contact information:   [address] [place], [place] [address] Phone: [phone] Fax: [phone]      Follow up with [facility] On 12/03/2015.   Why:  at 2:00pm for your initial therapy appointment with Maralyn SagoSarah.   Contact information:   [address], [place], [place], [address] [phone]  Office [phone]  Fax      Next level of care provider [facility] Link:no  Safety Planning and Suicide Prevention discussed: Yes,  with husband; see SPE note for further details  Have you used any form of tobacco in the last 30 days? (Cigarettes, Smokeless Tobacco, Cigars, and/or Pipes): No  Has patient been referred to the Quitline?: N/A patient is not a smoker  Patient has been referred for addiction treatment: N/A  Elaina HoopsCarter, Mckaila Duffus M 11/27/2015, 10:36 AM

## 2015-11-27 NOTE — Progress Notes (Signed)
Patient ID: Danielle Rocha, female   DOB: 01/16/1951, 65 y.o.   MRN: [id number]  Pt currently presents with a flat affect and pleasant behavior. Per self inventory, pt rates depression at 0-2, hopelessness 0 and anxiety 2. Pt's daily goal is to "transition to home" and they intend to do so by "working with staff." Pt reports good sleep, a good appetite, normal energy and good concentration. Pt complaining of right knee pain from "a fall before admission." Pt reports decreased pain in her back, refuses her lidocaine patch and states "I'll just see if I can manage without it."   Pt provided with medications per providers orders. Pt's labs and vitals were monitored throughout the day. Pt supported emotionally and encouraged to express concerns and questions. Pt educated on medications and the RICE acronym for her knee.   Pt's safety ensured with 15 minute and environmental checks. Pt currently denies SI/HI and A/V hallucinations. Pt verbally agrees to seek staff if SI/HI or A/VH occurs and to consult with staff before acting on these thoughts. Pt to be discharged today per MD. Will continue POC.

## 2015-11-27 NOTE — Discharge Summary (Signed)
Physician Discharge Summary Note  Patient:  Danielle Rocha is an 65 y.o., female MRN:  [id number] DOB:  1951-06-01 Patient phone:  [phone] (home)  Patient address:   [address] [place] [address],  Total Time spent with patient: 30 minutes  Date of Admission:  11/24/2015 Date of Discharge: 11/28/2015  Reason for Admission:  Depression worsened and developed SI  Principal Problem: Depression Discharge Diagnoses: Patient Active Problem List   Diagnosis Date Noted  . Depression [F32.9]     Priority: High  . Suicide attempt by drug ingestion (HCC) [T50.902A]   . Suicidal overdose (HCC) [T50.902A] 11/22/2015  . Suicidal intent [R45.851] 11/22/2015  . Acute kidney injury (HCC) [N17.9] 11/22/2015  . Atrial fibrillation (HCC) [I48.91]   . Chronic pain syndrome [G89.4]     Past Psychiatric History: see above noted  Past Medical History:  Past Medical History  Diagnosis Date  . Arthritis     DJD, low back, thumb  . Chicken pox   . Depression     zoloft , remeron  per psychiatry. ambien  per psychiatry to help wtih sleep element.   Marland Kitchen GERD (gastroesophageal reflux disease)     omeprazole OTC  . Atrial fibrillation (HCC)     Xarelto anticoagulation. Flecainide antiarrythmic   . Hyperlipidemia     lovastatin   . Colon polyp     awaiting records  . Goiter     states multiple imaging tests, has had biopsies  . Dystonia     described as psychogenic dystonia. Pain and twisting from upper chest and up ith triggers "wind, creamy food" on TID ativan per psychiatry previously.   . Vaginal atrophy     estrace vaginal cream  . Chronic pain syndrome     On disability. History of bilateral hip pain, low back pain. Gabapentin 400 BID, percocet 1 tablet daily per prior provider.   . Fibromyalgia   . TIA (transient ischemic attack)   . Stroke [facility])     TIA (left side of face and body decreased sensitivity than right face and side)    Past Surgical History   Procedure Laterality Date  . Piriformis release  1999  . Fusion l4-l5  2000  . Tonsillectomy and adenoidectomy  age 40  . Vaginal hysterectomy  1993  . Other surgical history      tennis elbow surgery  . Biopsy thyroid    . Laminectomy      L4-L5  . Atrial fibrillation ablation     Family History:  Family History  Problem Relation Age of Onset  . Alcohol abuse Mother   . Hypertension Father   . Alcohol abuse Father   . Pancreatic cancer Father   . Cancer Brother     spindle cell RLE  . Cancer Brother     tonsil cancer  . Skin cancer Brother    Family Psychiatric  History:  See above noted Social History:  History  Alcohol Use No     History  Drug Use No    Social History   Social History  . Marital Status: Married    Spouse Name: N/A  . Number of Children: N/A  . Years of Education: N/A   Social History Main Topics  . Smoking status: Never Smoker   . Smokeless tobacco: Never Used  . Alcohol Use: No  . Drug Use: No  . Sexual Activity: No   Other Topics Concern  . None   Social History Narrative  Family: Married. Husband works as Hospital doctor for [employer]. 2 children from previous marriage 1 from current. Son Danielle Rocha lives with them and has down's syndrome.       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 Moved to [facility] from [id number] and became disabled in that time frame due to hip/back issues.       Hobbies: time with son    Hospital Course:  Danielle Rocha was admitted for Depression and crisis management.  He was treated with the following medications listed below.  Danielle Rocha was discharged with current medication and was instructed on how to take medications as prescribed; (details listed below under Medication List).  Medical problems were identified and treated as needed.  Home medications were restarted as appropriate.  Improvement was monitored by observation and Danielle Rocha daily report of symptom reduction.   Emotional and mental status was monitored by daily self-inventory reports completed by Danielle Rocha and clinical staff.         Danielle Rocha was evaluated by the treatment team for stability and plans for continued recovery upon discharge.  Danielle Rocha motivation was an integral factor for scheduling further treatment.  Employment, transportation, bed availability, health status, family support, and any pending legal issues were also considered during his hospital stay.  He was offered further treatment options upon discharge including but not limited to Residential, Intensive Outpatient, and Outpatient treatment.  Danielle Rocha will follow up with the services as listed below under Follow Up Information.     Upon completion of this admission the Danielle Rocha was both mentally and medically stable for discharge denying suicidal/homicidal ideation, auditory/visual/tactile hallucinations, delusional thoughts and paranoia.     Physical Findings: AIMS:  , ,  ,  ,    CIWA:    COWS:     Musculoskeletal: Strength & Muscle Tone: within normal limits Gait & Station: normal Patient leans: N/A  Psychiatric Specialty Exam:  SEE MD SRA Review of Systems  All other systems reviewed and are negative.   Blood pressure 109/65, pulse 91, temperature 98.5 F (36.9 C), temperature source Oral, resp. rate 20, height  (1.702 m), weight 69.854 kg (154 lb), SpO2 98 %.Body mass index is 24.11 kg/(m^2).  Have you used any form of tobacco in the last 30 days? (Cigarettes, Smokeless Tobacco, Cigars, and/or Pipes): No  Has this patient used any form of tobacco in the last 30 days? (Cigarettes, Smokeless Tobacco, Cigars, and/or Pipes) Yes, NA  Blood Alcohol level:  No results found for: [facility]  Metabolic Disorder Labs:  No results found for: HGBA1C, MPG No results found for: PROLACTIN No results found for: CHOL, TRIG, HDL, CHOLHDL, VLDL, LDLCALC  See Psychiatric Specialty Exam and Suicide Risk  Assessment completed by Attending Physician prior to discharge.  Discharge destination:  Home  Is patient on multiple antipsychotic therapies at discharge:  No   Has Patient had three or more failed trials of antipsychotic monotherapy by history:  No  Recommended Plan for Multiple Antipsychotic Therapies: NA     Medication List    STOP taking these medications        cyclobenzaprine 5 MG tablet  Commonly known as:  FLEXERIL     LORazepam 1 MG tablet  Commonly known as:  ATIVAN     omeprazole 20 MG capsule  Commonly known as:  PRILOSEC  Replaced by:  pantoprazole 40 MG tablet      TAKE these medications      Indication  atorvastatin 20 MG tablet  Commonly known as:  LIPITOR  Take 1 tablet (20 mg total) by mouth daily.      flecainide 100 MG tablet  Commonly known as:  TAMBOCOR  Take 1 tablet (100 mg total) by mouth 2 (two) times daily.   Indication:  Atrial Fibrillation     hydrOXYzine 50 MG tablet  Commonly known as:  ATARAX/VISTARIL  Take 1 tablet (50 mg total) by mouth once as needed (sleep).   Indication:  Anxiety Neurosis     mirtazapine 45 MG tablet  Commonly known as:  REMERON  Take 1 tablet (45 mg total) by mouth at bedtime.   Indication:  Trouble Sleeping, Major Depressive Disorder     nitrofurantoin (macrocrystal-monohydrate) 100 MG capsule  Commonly known as:  MACROBID  Take 1 capsule (100 mg total) by mouth every 12 (twelve) hours.   Indication:  Urinary Tract Infection     pantoprazole 40 MG tablet  Commonly known as:  PROTONIX  Take 1 tablet (40 mg total) by mouth daily.   Indication:  Gastroesophageal Reflux Disease     rivaroxaban 20 MG Tabs tablet  Commonly known as:  XARELTO  Take 1 tablet (20 mg total) by mouth daily with supper.   Indication:  Cerebrovascular accident secondary to Atrial Fibrillation       Follow-up Information    [facility] Go on 12/05/2015.   Why:  at 11:15am for medication management  with Danielle Rocha.   Contact information:   [address] [place], [place] [address] Phone: [phone] Fax: [phone]      Follow up with [facility] On 12/03/2015.   Why:  at 2:00pm for your initial therapy appointment with Danielle Rocha.   Contact information:   [address], [place], [place], [address] [phone]  Office [phone]  Fax      Follow-up recommendations:  Activity:  as tol Diet:  as tol  Comments:  1.  Take all your medications as prescribed.              2.  Report any adverse side effects to outpatient provider.                       3.  Patient instructed to not use alcohol or illegal drugs while on prescription medicines.            4.  In the event of worsening symptoms, instructed patient to call 911, the crisis hotline or go to nearest emergency room for evaluation of symptoms.  Signed: Lindwood QuaSheila May Agustin, NP [facility] 11/28/2015, 3:36 PM   Patient seen, Suicide Assessment Completed.  Disposition Plan Reviewed

## 2015-11-27 NOTE — Tx Team (Addendum)
Interdisciplinary Treatment Plan Update (Adult) Date: 11/27/2015   Date: 11/27/2015 10:26 AM  Progress in Treatment:  Attending groups: Yes  Participating in groups: Yes  Taking medication as prescribed: Yes  Tolerating medication: Yes  Family/Significant othe contact made: Yes, with husband Patient understands diagnosis: Yes AEB seeking help with depression and anxiety Discussing patient identified problems/goals with staff: Yes  Medical problems stabilized or resolved: Yes  Denies suicidal/homicidal ideation:Yes  Patient has not harmed self or Others: Yes   New problem(s) identified: None identified at this time.   Discharge Plan or Barriers: Pt will return home and follow-up with outpatient resources  Additional comments:  Patient and CSW reviewed pt's identified goals and treatment plan. Patient verbalized understanding and agreed to treatment plan. CSW reviewed [facility] "Discharge Process and Patient Involvement" Form. Pt verbalized understanding of information provided and signed form.   Reason for Continuation of Hospitalization:  Anxiety Depression Medication stabilization Suicidal ideation  Estimated length of stay: 0 days  Review of initial/current patient goals per problem list:   1.  Goal(s): Patient will participate in aftercare plan  Met:  Yes  Target date: 3-5 days from date of admission   As evidenced by: Patient will participate within aftercare plan AEB aftercare provider and housing plan at discharge being identified.   11/27/15: Pt will return home and follow-up with outpatient resources  2.  Goal (s): Patient will exhibit decreased depressive symptoms and suicidal ideations.  Met:  Adequate for DC  Target date: 3-5 days from date of admission   As evidenced by: Patient will utilize self rating of depression at 3 or below and demonstrate decreased signs of depression or be deemed stable for discharge by MD.  11/27/15: MD feels that Pt's symptoms have  decreased to the point that they can be managed in an outpatient setting.   3.  Goal(s): Patient will demonstrate decreased signs and symptoms of anxiety.  Met:  Adequate for DC  Target date: 3-5 days from date of admission   As evidenced by: Patient will utilize self rating of anxiety at 3 or below and demonstrated decreased signs of anxiety, or be deemed stable for discharge by MD  11/27/15: MD feels that Pt's symptoms have decreased to the point that they can be managed in an outpatient setting.   Attendees:  Patient:    Family:    Physician: Dr. Parke Poisson, MD  11/27/2015 10:26 AM  Nursing: Lars Pinks, RN Case manager  11/27/2015 10:26 AM  Clinical Social Worker Peri Maris, [place] 11/27/2015 10:26 AM  Other: Tilden Fossa, [place] 11/27/2015 10:26 AM  Clinical: Mayra Neer,  RN; Marcella Dubs, RN 11/27/2015 10:26 AM  Other: , RN Charge Nurse 11/27/2015 10:26 AM  Other: [name], [place], Onset Social Work [phone]

## 2015-11-27 NOTE — [facility] Suicide Risk Assessment (Signed)
[facility] Discharge Suicide Risk Assessment   Principal Problem:  Suicide attempt  Discharge Diagnoses:  Patient Active Problem List   Diagnosis Date Noted  . Suicide attempt by drug ingestion (HCC) [T50.902A]   . Suicidal overdose (HCC) [T50.902A] 11/22/2015  . Suicidal intent [R45.851] 11/22/2015  . Acute kidney injury (HCC) [N17.9] 11/22/2015  . Atrial fibrillation (HCC) [I48.91]   . Chronic pain syndrome [G89.4]   . Depression [F32.9]     Total Time spent with patient: 30 minutes  Musculoskeletal: Strength & Muscle Tone: within normal limits Gait & Station: normal Patient leans: N/A  Psychiatric Specialty Exam: ROS  Blood pressure 109/65, pulse 91, temperature 98.5 F (36.9 C), temperature source Oral, resp. rate 20, height  (1.702 m), weight 154 lb (69.854 kg), SpO2 98 %.Body mass index is 24.11 kg/(m^2).  General Appearance: Well Groomed  Eye Contact::  Good  Speech:  Normal Rate409  Volume:  Normal  Mood:  improved, currently euthymic   Affect:  Appropriate and reactive   Thought Process:  Linear  Orientation:  Full (Time, Place, and Person)  Thought Content:  no hallucinations, no delusions   Suicidal Thoughts:  No denies any suicidal ideations, and no self injurious ideations  Homicidal Thoughts:  No  Memory:  recent and remote grossly intact   Judgement:  Good  Insight:  improved  Psychomotor Activity:  Normal  Concentration:  recent and remote grossly intact   Recall:  Good  Fund of Knowledge:Good  Language: Good  Akathisia:  Negative  Handed:  Right  AIMS (if indicated):     Assets:  Desire for Improvement Housing Resilience Social Support  Sleep:  Number of Hours: 6.75  Cognition: WNL  ADL's:  Intact   Mental Status Per Nursing Assessment::   On Admission:     Demographic Factors:  65 year old married female, lives at home with husband and one adult son, who has Down Syndrome   Loss Factors: Anniversary of mother's death, dealing with  mother's estate - states she is happy that her brother is now going to take charge of dealing with the estate issues   Historical Factors: No prior psychiatric admissions, no prior suicidal attempts, history of depression  Risk Reduction Factors:   Sense of responsibility to family, Living with another person, especially a relative, Positive social support and Positive coping skills or problem solving skills  Continued Clinical Symptoms:  At this time patient improved compared to admission- mood improved and today denies depression, and presents with full range of affect, no thought disorder, no SI or HI, no psychotic symptoms, future oriented . Denies medication side effects.   Cognitive Features That Contribute To Risk:  No gross cognitive deficits noted upon discharge. Is alert , attentive, and oriented x 3   Suicide Risk:  Mild:  Suicidal ideation of limited frequency, intensity, duration, and specificity.  There are no identifiable plans, no associated intent, mild dysphoria and related symptoms, good self-control (both objective and subjective assessment), few other risk factors, and identifiable protective factors, including available and accessible social support.  Follow-up Information    Follow up with [facility]. Go on 12/05/2015.   Why:  at 11:15am for medication management with Lauris Poag   Contact information:   [address] [place], [place] [address] Phone: [phone] Fax: [phone]      Follow up with [facility] On 12/03/2015.   Why:  at 2:00pm for your initial therapy appointment with Maralyn Sago.  Contact information:   [address], [place], [place], [address] [phone]  Office [phone]  Fax      Plan Of Care/Follow-up recommendations:  Activity:  as tolerated  Diet:  Regular  Tests:  NA Other:  see below  Patient is leaving unit in good spirits  Plans to return home Follow up as  above  Has a PCP , Dr. Durene CalHunter, for medical issues, and has appointment with him later this week. Nehemiah MassedOBOS, Ankush Gintz, MD 11/27/2015, 10:47 AM

## 2015-11-27 NOTE — [facility] Suicide Risk Assessment (Signed)
[facility] INPATIENT:  Family/Significant Other Suicide Prevention Education  Suicide Prevention Education:  Education Completed; De NurseChristopher Piccininni, Pt's husband [phone],  has been identified by the patient as the family member/significant other with whom the patient will be residing, and identified as the person(s) who will aid the patient in the event of a mental health crisis (suicidal ideations/suicide attempt).  With written consent from the patient, the family member/significant other has been provided the following suicide prevention education, prior to the and/or following the discharge of the patient.  The suicide prevention education provided includes the following:  Suicide risk factors  Suicide prevention and interventions  National Suicide Hotline telephone number  [facility] assessment telephone number  [facility] Emergency Assistance 911  [facility] and/or Residential Mobile Crisis Unit telephone number  Request made of family/significant other to:  Remove weapons (e.g., guns, rifles, knives), all items previously/currently identified as safety concern.    Remove drugs/medications (over-the-counter, prescriptions, illicit drugs), all items previously/currently identified as a safety concern.  The family member/significant other verbalizes understanding of the suicide prevention education information provided.  The family member/significant other agrees to remove the items of safety concern listed above.  Elaina Hoopsarter, Nalaysia Manganiello M 11/27/2015, 10:42 AM

## 2015-11-27 NOTE — Progress Notes (Signed)
Patient ID: Danielle Rocha, female   DOB: 09/14/1951, 65 y.o.   MRN: [id number]  Pt discharged home with her husband. Pt was stable and appreciative at that time. All papers and prescriptions were given and valuables returned. Verbal understanding expressed. Denies SI/HI and A/VH. Pt given opportunity to express concerns and ask questions.

## 2015-11-29 ENCOUNTER — Ambulatory Visit (INDEPENDENT_AMBULATORY_CARE_PROVIDER_SITE_OTHER): Payer: BLUE CROSS/BLUE SHIELD | Admitting: Family Medicine

## 2015-11-29 ENCOUNTER — Other Ambulatory Visit: Payer: Self-pay | Admitting: Family Medicine

## 2015-11-29 ENCOUNTER — Encounter: Payer: Self-pay | Admitting: Family Medicine

## 2015-11-29 VITALS — BP 138/70 | HR 73 | Temp 98.4°F | Wt 154.0 lb

## 2015-11-29 DIAGNOSIS — F329 Major depressive disorder, single episode, unspecified: Secondary | ICD-10-CM

## 2015-11-29 DIAGNOSIS — G894 Chronic pain syndrome: Secondary | ICD-10-CM

## 2015-11-29 DIAGNOSIS — F32A Depression, unspecified: Secondary | ICD-10-CM

## 2015-11-29 DIAGNOSIS — R928 Other abnormal and inconclusive findings on diagnostic imaging of breast: Secondary | ICD-10-CM

## 2015-11-29 MED ORDER — HYDROCODONE-ACETAMINOPHEN 5-325 MG PO TABS: 0.5000 | ORAL_TABLET | Freq: Two times a day (BID) | ORAL | Status: DC | PRN

## 2015-11-29 NOTE — Progress Notes (Signed)
Danielle ConchStephen Hunter, MD  Subjective:  Danielle Rocha is a 65 y.o. year old very pleasant female patient who presents for/with See problem oriented charting ROS- No chest pain or shortness of breath. No headache or blurry vision. No SI/HI. Endorses depressed mood but improving. Difficulty sleeping  Past Medical History-  Patient Active Problem List   Diagnosis Date Noted  . Suicidal overdose (HCC) 11/22/2015    Priority: High  . Atrial fibrillation (HCC)     Priority: High  . Chronic pain syndrome     Priority: High  . Depression     Priority: Medium    Medications- reviewed and updated Current Outpatient Prescriptions  Medication Sig Dispense Refill  . atorvastatin (LIPITOR) 20 MG tablet Take 1 tablet (20 mg total) by mouth daily. 30 tablet 0  . flecainide (TAMBOCOR) 100 MG tablet Take 1 tablet (100 mg total) by mouth 2 (two) times daily. 60 tablet 0  . mirtazapine (REMERON) 45 MG tablet Take 1 tablet (45 mg total) by mouth at bedtime. 30 tablet 0  . pantoprazole (PROTONIX) 40 MG tablet Take 1 tablet (40 mg total) by mouth daily. 30 tablet 0  . rivaroxaban (XARELTO) 20 MG TABS tablet Take 1 tablet (20 mg total) by mouth daily with supper. 30 tablet 0  . HYDROcodone-acetaminophen (NORCO/VICODIN) 5-325 MG tablet Take 0.5 tablets by mouth 2 (two) times daily as needed. 30 tablet 0  . hydrOXYzine (ATARAX/VISTARIL) 50 MG tablet Take 1 tablet (50 mg total) by mouth once as needed (sleep). (Patient not taking: Reported on 11/29/2015) 30 tablet 0   No current facility-administered medications for this visit.    Objective: BP 138/70 mmHg  Pulse 73  Temp(Src) 98.4 F (36.9 C)  Wt 154 lb (69.854 kg) Gen: NAD, resting comfortably CV: RRR no murmurs rubs or gallops Lungs: CTAB no crackles, wheeze, rhonchi Abdomen: soft/nontender/nondistended/normal bowel sounds.   Ext: no edema Skin: warm, dry Neuro: grossly normal, moves all extremities  Assessment/Plan:  Depression S: recent  hospitalization for SI- took large volume of tylenol but also took some [place] and ativan at same time. High stress level dealing with son with Down's and family financial dispute with brother. Previously- lamictal, latuda, ambien, ativan, zoloft all removed. Apparently she had some issues with falls so plan was to remove most fall risk medications A/P: She is seeing psychiatry on Monday and Wednesdays for ongoing outpatient care. Her depression is in improved state but working for remission. Her main issue today is pain as noted under pain management. She clearly states no SI/HI and is in [place] state of mind than before.    Chronic pain syndrome S: Off pain medicine for a week and has slept only about 2 hours a night due to severe pain.  A/P: remain off gabapentin. We will go back to 1/2 tab BID and given prior SI close follow up next month- if in more stable place at that point may move out to 3 months. Need pain contract next visit.   we discussed risks of medication. At present believe benefits outweigh risk- emergently call 911 if any SI  1 month. Return precautions advised.   Meds ordered this encounter  Medications  . HYDROcodone-acetaminophen (NORCO/VICODIN) 5-325 MG tablet    Sig: Take 0.5 tablets by mouth 2 (two) times daily as needed.    Dispense:  30 tablet    Refill:  0    The duration of face-to-face time during this visit was 25 minutes. Greater than 50% of this  time was spent in counseling, explanation of diagnosis, planning of further management, and/or coordination of care.

## 2015-11-29 NOTE — Assessment & Plan Note (Signed)
S: Off pain medicine for a week and has slept only about 2 hours a night due to severe pain.  A/P: remain off gabapentin. We will go back to 1/2 tab BID and given prior SI close follow up next month- if in more stable place at that point may move out to 3 months. Need pain contract next visit.

## 2015-11-29 NOTE — Patient Instructions (Signed)
Restart hydrocodone but only 1/2 pill twice a day.   If any thoughts of hurting yourself call 911 immediately   Let's reassess in 1 month- may return to 3 month schedule at that point  I am so glad that you are ok!

## 2015-11-29 NOTE — Assessment & Plan Note (Signed)
S: recent hospitalization for SI- took large volume of tylenol but also took some [place] and ativan at same time. High stress level dealing with son with Down's and family financial dispute with brother. Previously- lamictal, latuda, ambien, ativan, zoloft all removed. Apparently she had some issues with falls so plan was to remove most fall risk medications A/P: She is seeing psychiatry on Monday and Wednesdays for ongoing outpatient care. Her depression is in improved state but working for remission. Her main issue today is pain as noted under pain management. She clearly states no SI/HI and is in [place] state of mind than before.

## 2015-12-06 ENCOUNTER — Ambulatory Visit
Admission: RE | Admit: 2015-12-06 | Discharge: 2015-12-06 | Disposition: A | Discharge status: Home / Self Care | Payer: BLUE CROSS/BLUE SHIELD | Source: Ambulatory Visit | Attending: Family Medicine | Admitting: Family Medicine

## 2015-12-06 DIAGNOSIS — R928 Other abnormal and inconclusive findings on diagnostic imaging of breast: Secondary | ICD-10-CM

## 2015-12-24 ENCOUNTER — Encounter: Payer: Self-pay | Admitting: Obstetrics and Gynecology

## 2015-12-27 ENCOUNTER — Other Ambulatory Visit: Payer: Self-pay

## 2015-12-27 MED ORDER — ATORVASTATIN CALCIUM 20 MG PO TABS: 20.0000 mg | ORAL_TABLET | Freq: Every day | ORAL | Status: DC

## 2015-12-27 MED ORDER — HYDROXYZINE HCL 50 MG PO TABS: 50.0000 mg | ORAL_TABLET | Freq: Once | ORAL | Status: DC | PRN

## 2015-12-28 ENCOUNTER — Other Ambulatory Visit: Payer: Self-pay

## 2015-12-28 MED ORDER — ATORVASTATIN CALCIUM 20 MG PO TABS: 20.0000 mg | ORAL_TABLET | Freq: Every day | ORAL | Status: DC

## 2015-12-31 ENCOUNTER — Ambulatory Visit (INDEPENDENT_AMBULATORY_CARE_PROVIDER_SITE_OTHER): Payer: BLUE CROSS/BLUE SHIELD | Admitting: Family Medicine

## 2015-12-31 ENCOUNTER — Encounter: Payer: Self-pay | Admitting: Family Medicine

## 2015-12-31 VITALS — BP 100/70 | HR 75 | Temp 98.3°F | Wt 152.0 lb

## 2015-12-31 DIAGNOSIS — G894 Chronic pain syndrome: Secondary | ICD-10-CM

## 2015-12-31 DIAGNOSIS — M25561 Pain in right knee: Secondary | ICD-10-CM

## 2015-12-31 DIAGNOSIS — F32A Depression, unspecified: Secondary | ICD-10-CM

## 2015-12-31 DIAGNOSIS — M25569 Pain in unspecified knee: Secondary | ICD-10-CM | POA: Diagnosis not present

## 2015-12-31 DIAGNOSIS — F329 Major depressive disorder, single episode, unspecified: Secondary | ICD-10-CM | POA: Diagnosis not present

## 2015-12-31 MED ORDER — METHYLPREDNISOLONE ACETATE 80 MG/ML IJ SUSP
80.0000 mg | Freq: Once | INTRAMUSCULAR | Status: AC
  Administered 2015-12-31: 80 mg via INTRAMUSCULAR

## 2015-12-31 MED ORDER — HYDROCODONE-ACETAMINOPHEN 5-325 MG PO TABS: 0.5000 | ORAL_TABLET | Freq: Two times a day (BID) | ORAL | Status: DC | PRN

## 2015-12-31 NOTE — Assessment & Plan Note (Signed)
S:Psychiatry every other week. Counseling weekly. May be scaling back on both. zyprexa 20mg  at bedtime. Being back on 1/2 tab BID of vicodin is helping minimally but enough so that pain is not primary depression driver. No si/HI.  A/P: continue current therapy. No signs or intention of overdose- continue pscyh follow up but ok to continue vicodin as well

## 2015-12-31 NOTE — Patient Instructions (Signed)
We trialed a knee injection today given your right knee pain has stood out beyond your other regular pains. This could be arthritis or degenerative meniscal tear. We hope injections will help for up to 3 months and sometime underlying issue resolves, sometimes not. May swell up tonight- may use ice 20 minutes up to once an hour if needed. May take 1/2 of your pain pill as well.   Let's check in 1 month from now given increase in knee pain- if pain goes back to normal can resume every 3 months

## 2015-12-31 NOTE — Assessment & Plan Note (Signed)
S: Patient continues to have bilateral hip and low back pain which are helped some by1/2 tab BID vicodin. When she had her overdose - fell and landed on right knee and has been in pain ever since. Most pain along joint line. Worse with squatting, bending, sometimes even sitting but walking also causes pain. Pain with walking 4/10 which is less than her pain with squatting/bending.  A/P: Continue vicodin 1/2 tab BID. We also did a therapeutic and diagnostic injection into right knee- suspect OA vs. Degenerative meniscal tear. Walking pain down to 1/10 from 4/10 with treatment.

## 2015-12-31 NOTE — Progress Notes (Signed)
Tana Conch, MD  Subjective:  Danielle Rocha is a 65 y.o. year old very pleasant female patient who presents for/with See problem oriented charting ROS- no SI/HI. Some depressed mood but improving. No chest pain or shortness of breath. No fecal or  Urinary incontinence or worsening low back pain.   Past Medical History-  Patient Active Problem List   Diagnosis Date Noted  . Suicidal overdose (HCC) 11/22/2015    Priority: High  . Atrial fibrillation (HCC)     Priority: High  . Chronic pain syndrome     Priority: High  . Depression     Priority: Medium    Medications- reviewed and updated Current Outpatient Prescriptions  Medication Sig Dispense Refill  . atorvastatin (LIPITOR) 20 MG tablet Take 1 tablet (20 mg total) by mouth daily. 30 tablet 5  . flecainide (TAMBOCOR) 100 MG tablet Take 1 tablet (100 mg total) by mouth 2 (two) times daily. 60 tablet 0  . hydrOXYzine (ATARAX/VISTARIL) 50 MG tablet Take 1 tablet (50 mg total) by mouth once as needed (sleep). 30 tablet 5  . OLANZapine (ZYPREXA) 10 MG tablet Take 10 mg by mouth at bedtime.    . pantoprazole (PROTONIX) 40 MG tablet Take 1 tablet (40 mg total) by mouth daily. 30 tablet 0  . rivaroxaban (XARELTO) 20 MG TABS tablet Take 1 tablet (20 mg total) by mouth daily with supper. 30 tablet 0  . HYDROcodone-acetaminophen (NORCO/VICODIN) 5-325 MG tablet Take 0.5 tablets by mouth 2 (two) times daily as needed. 30 tablet 0   No current facility-administered medications for this visit.    Objective: BP 100/70 mmHg  Pulse 75  Temp(Src) 98.3 F (36.8 C)  Wt 152 lb (68.947 kg) Gen: NAD, resting comfortably CV: RRR no murmurs rubs or gallops Lungs: CTAB no crackles, wheeze, rhonchi Abdomen: soft/nontender/nondistended/normal bowel sounds. No rebound or guarding.  Ext: no edema Skin: warm, dry Neuro: grossly normal, moves all extremities  Right Knee: Normal to inspection with no erythema or effusion or obvious bony  abnormalities. Palpation with both medial and lateral joint line tenderness. Worse medially ROM normal in flexion and extension and lower leg rotation. Ligaments with solid consistent endpoints including ACL, PCL. Mild laxity  LCL, MCL Negative Mcmurray's in regards to click but does cause recurrent pain Patellar and quadriceps tendons unremarkable. Hamstring and quadriceps strength is normal.  Right knee injection Consent obtained and verified. Sterile betadine prep. Furthur cleansed with alcohol. Topical analgesic spray: Ethyl chloride. Joint: right knee Approached in typical fashion with: anterior medial approach Completed without difficulty Meds: 1 cc depo medrol /cc. With 4 cc lidocaine Needle: 1 1/2 inch 25 gauge needle Aftercare instructions and Red flags advised.  Assessment/Plan:  Depression S:Psychiatry every other week. Counseling weekly. May be scaling back on both. zyprexa  at bedtime. Being back on 1/2 tab BID of vicodin is helping minimally but enough so that pain is not primary depression driver. No si/HI.  A/P: continue current therapy. No signs or intention of overdose- continue pscyh follow up but ok to continue vicodin as well   Chronic pain syndrome S: Patient continues to have bilateral hip and low back pain which are helped some by1/2 tab BID vicodin. When she had her overdose - fell and landed on right knee and has been in pain ever since. Most pain along joint line. Worse with squatting, bending, sometimes even sitting but walking also causes pain. Pain with walking 4/10 which is less than her pain with squatting/bending.  A/P: Continue vicodin 1/2 tab BID. We also did a therapeutic and diagnostic injection into right knee- suspect OA vs. Degenerative meniscal tear. Walking pain down to 1/10 from 4/10 with treatment.     Return in about 4 weeks (around 01/28/2016). depending on how knee doing- extend pain management to 3 months or refer to orthopedics.   Return precautions advised.   Meds ordered this encounter  Medications  . OLANZapine (ZYPREXA) 10 MG tablet    Sig: Take 10 mg by mouth at bedtime.  Marland Kitchen. HYDROcodone-acetaminophen (NORCO/VICODIN) 5-325 MG tablet    Sig: Take 0.5 tablets by mouth 2 (two) times daily as needed.    Dispense:  30 tablet    Refill:  0  . methylPREDNISolone acetate (DEPO-MEDROL) injection 80 mg    Sig:

## 2016-01-30 ENCOUNTER — Encounter: Payer: Self-pay | Admitting: Family Medicine

## 2016-01-30 ENCOUNTER — Ambulatory Visit (INDEPENDENT_AMBULATORY_CARE_PROVIDER_SITE_OTHER): Payer: BLUE CROSS/BLUE SHIELD | Admitting: Family Medicine

## 2016-01-30 VITALS — BP 110/70 | HR 70 | Temp 98.0°F | Wt 153.0 lb

## 2016-01-30 DIAGNOSIS — F329 Major depressive disorder, single episode, unspecified: Secondary | ICD-10-CM

## 2016-01-30 DIAGNOSIS — G894 Chronic pain syndrome: Secondary | ICD-10-CM | POA: Diagnosis not present

## 2016-01-30 DIAGNOSIS — K219 Gastro-esophageal reflux disease without esophagitis: Secondary | ICD-10-CM

## 2016-01-30 DIAGNOSIS — F32A Depression, unspecified: Secondary | ICD-10-CM

## 2016-01-30 MED ORDER — HYDROCODONE-ACETAMINOPHEN 5-325 MG PO TABS: 0.5000 | ORAL_TABLET | Freq: Two times a day (BID) | ORAL | Status: AC | PRN

## 2016-01-30 MED ORDER — OMEPRAZOLE 20 MG PO CPDR: 20.0000 mg | DELAYED_RELEASE_CAPSULE | Freq: Two times a day (BID) | ORAL | Status: DC

## 2016-01-30 MED ORDER — HYDROCODONE-ACETAMINOPHEN 5-325 MG PO TABS: 0.5000 | ORAL_TABLET | Freq: Two times a day (BID) | ORAL | Status: DC | PRN

## 2016-01-30 MED ORDER — HYDROCODONE-ACETAMINOPHEN 5-325 MG PO TABS: 1.0000 | ORAL_TABLET | Freq: Four times a day (QID) | ORAL | Status: DC | PRN

## 2016-01-30 NOTE — Assessment & Plan Note (Signed)
S: Asymptomatic on omeprazole 40mg  (2 tablets takes all at once once a day) A/P: has not trialed lower dose in recent memory- advised trial of once a day and if tolerates will change rx

## 2016-01-30 NOTE — Assessment & Plan Note (Signed)
S: psychiatry every other week , now once a month. Continuing counseling weekly previously now to monthly given improvement. Zyprexa 20mg  at bedtime. Also added depakote which is helping with sleep. Prior had been on remeron alone No Si/HI- with history of suicidal overdose earlier this year A/P:  Pleased patient with so much improvement. She will continue psychiatric care and regular counseling.

## 2016-01-30 NOTE — Progress Notes (Signed)
Subjective:  Danielle Rocha is a 65 y.o. year old very pleasant female patient who presents for/with See problem oriented charting ROS- No chest pain or shortness of breath. No headache or blurry vision. No SI/HI.Marland Kitchensee any ROS included in HPI as well.   Past Medical History-  Patient Active Problem List   Diagnosis Date Noted  . Suicidal overdose (HCC) 11/22/2015    Priority: High  . Atrial fibrillation (HCC)     Priority: High  . Chronic pain syndrome     Priority: High  . Depression     Priority: Medium  . GERD (gastroesophageal reflux disease) 01/30/2016    Medications- reviewed and updated Current Outpatient Prescriptions  Medication Sig Dispense Refill  . atorvastatin (LIPITOR) 20 MG tablet Take 1 tablet (20 mg total) by mouth daily. 30 tablet 5  . divalproex (DEPAKOTE) 500 MG DR tablet Take 500 mg by mouth 2 (two) times daily.    . flecainide (TAMBOCOR) 100 MG tablet Take 1 tablet (100 mg total) by mouth 2 (two) times daily. 60 tablet 0  . OLANZapine (ZYPREXA) 20 MG tablet Take 20 mg by mouth at bedtime.    . rivaroxaban (XARELTO) 20 MG TABS tablet Take 1 tablet (20 mg total) by mouth daily with supper. 30 tablet 0  . HYDROcodone-acetaminophen (NORCO/VICODIN) 5-325 MG tablet Take 0.5 tablets by mouth 2 (two) times daily as needed for moderate pain or severe pain (may fill today.). 30 tablet 0  . HYDROcodone-acetaminophen (NORCO/VICODIN) 5-325 MG tablet Take 0.5 tablets by mouth 2 (two) times daily as needed for moderate pain or severe pain (may fill in 1 month). 30 tablet 0  . HYDROcodone-acetaminophen (NORCO/VICODIN) 5-325 MG tablet Take 1 tablet by mouth every 6 (six) hours as needed for moderate pain or severe pain (May fill in 2 months). 30 tablet 0  . omeprazole (PRILOSEC) 20 MG capsule Take 1 capsule (20 mg total) by mouth 2 (two) times daily before a meal. 60 capsule 6   No current facility-administered medications for this visit.    Objective: BP 110/70 mmHg  Pulse  70  Temp(Src) 98 F (36.7 C)  Wt 153 lb (69.4 kg) Gen: NAD, resting comfortably CV: RRR no murmurs rubs or gallops Lungs: CTAB no crackles, wheeze, rhonchi Ext: no edema MSK: some medial joint line tenderness right knee- no crepitus Skin: warm, dry Neuro: grossly normal, moves all extremities  Assessment/Plan:  Depression S: psychiatry every other week , now once a month. Continuing counseling weekly previously now to monthly given improvement. Zyprexa  at bedtime. Also added depakote which is helping with sleep. Prior had been on remeron alone No Si/HI- with history of suicidal overdose earlier this year A/P:  Pleased patient with so much improvement. She will continue psychiatric care and regular counseling.    Chronic pain syndrome S: continues 1/2 tab BID for bilateral hip and low back pain. Has had right knee pain as well since her fall at time of overdose. Walking 4/10 and wosrse with squatting/bending- we did therapeutic/diagnostic trial of injection for OA vs. Meniscal tear a month ago. Still trouble with kneeling or squatting up to 7/10. Walking no issues. Occasionally hears click or pop.  A/P: Knee is doing better at this point- offered ortho referral and declines. Reasonable control on 1/2 tablet hydrocodone  twice a day. Discussed tolerance and that more sparing use may give her greater benefit in long run.   GERD (gastroesophageal reflux disease) S: Asymptomatic on omeprazole  (2 tablets takes all  at once once a day) A/P: has not trialed lower dose in recent memory- advised trial of once a day and if tolerates will change rx   Return in about 3 months (around 05/01/2016). Return precautions advised.    Meds ordered this encounter  Medications  . OLANZapine (ZYPREXA) 20 MG tablet    Sig: Take 20 mg by mouth at bedtime.  . divalproex (DEPAKOTE) 500 MG DR tablet    Sig: Take 500 mg by mouth 2 (two) times daily.  Marland Kitchen. omeprazole (PRILOSEC) 20 MG capsule    Sig:  Take 1 capsule (20 mg total) by mouth 2 (two) times daily before a meal.    Dispense:  60 capsule    Refill:  6  . HYDROcodone-acetaminophen (NORCO/VICODIN) 5-325 MG tablet    Sig: Take 0.5 tablets by mouth 2 (two) times daily as needed for moderate pain or severe pain (may fill today.).    Dispense:  30 tablet    Refill:  0  . HYDROcodone-acetaminophen (NORCO/VICODIN) 5-325 MG tablet    Sig: Take 0.5 tablets by mouth 2 (two) times daily as needed for moderate pain or severe pain (may fill in 1 month).    Dispense:  30 tablet    Refill:  0  . HYDROcodone-acetaminophen (NORCO/VICODIN) 5-325 MG tablet    Sig: Take 1 tablet by mouth every 6 (six) hours as needed for moderate pain or severe pain (May fill in 2 months).    Dispense:  30 tablet    Refill:  0    Tana ConchStephen Tauni Sanks, MD

## 2016-01-30 NOTE — Assessment & Plan Note (Signed)
S: continues 1/2 tab BID for bilateral hip and low back pain. Has had right knee pain as well since her fall at time of overdose. Walking 4/10 and wosrse with squatting/bending- we did therapeutic/diagnostic trial of injection for OA vs. Meniscal tear a month ago. Still trouble with kneeling or squatting up to 7/10. Walking no issues. Occasionally hears click or pop.  A/P: Knee is doing better at this point- offered ortho referral and declines. Reasonable control on 1/2 tablet hydrocodone 5mg  twice a day. Discussed tolerance and that more sparing use may give her greater benefit in long run.

## 2016-01-30 NOTE — Patient Instructions (Signed)
Thrilled you are doing better.   For chronic pain Continue 1/2 tablet hydrocodone twice a day. Return in 3 months.  Glad knee is better but we could have orthopedics evaluate in future if you would like- you declined today  Trial omeprazole just 1 tablet a day - and if no recurrence of symptoms continue at that dose

## 2016-03-05 ENCOUNTER — Encounter: Payer: Self-pay | Admitting: Obstetrics and Gynecology

## 2016-03-19 LAB — HEPATIC FUNCTION PANEL
ALT: 25 U/L (ref 7–35)
AST: 23 U/L (ref 13–35)

## 2016-03-19 LAB — BASIC METABOLIC PANEL
BUN: 19 mg/dL (ref 4–21)
CREATININE: 0.9 mg/dL (ref <=1.1)
Glucose: 88 mg/dL

## 2016-03-27 ENCOUNTER — Encounter: Payer: Self-pay | Admitting: Family Medicine

## 2016-03-27 ENCOUNTER — Ambulatory Visit (INDEPENDENT_AMBULATORY_CARE_PROVIDER_SITE_OTHER): Payer: BLUE CROSS/BLUE SHIELD | Admitting: Family Medicine

## 2016-03-27 VITALS — BP 94/62 | HR 72 | Temp 97.7°F | Ht 67.0 in | Wt 148.0 lb

## 2016-03-27 DIAGNOSIS — M25561 Pain in right knee: Secondary | ICD-10-CM

## 2016-03-27 DIAGNOSIS — G894 Chronic pain syndrome: Secondary | ICD-10-CM

## 2016-03-27 MED ORDER — HYDROCODONE-ACETAMINOPHEN 5-325 MG PO TABS: 0.5000 | ORAL_TABLET | Freq: Two times a day (BID) | ORAL | Status: DC | PRN

## 2016-03-27 NOTE — Patient Instructions (Signed)
We will call you within a week about your referral to orthopedics and pain management. If you do not hear within 2 weeks, give us a call.   Refilled medicine- last rx should last to August 10th. This one should last to September 10th- I gave you 5 extra to space out sparingly

## 2016-03-27 NOTE — Assessment & Plan Note (Signed)
S:On disability.  Pain contract 12/31/15. History of bilateral hip pain, low back pain since at least 1998. Workup by orthopedics in another location in past (piriformis release 1999, multiple injections into back and hips), neurosurgery(laminectomy l4-l5 2000) , pain management (did not want to try spinal stimulator as final option given) and continues to have hip and back pain.  When I inherited patient she was on Gabapentin 400 BID, percocet 1 tablet daily per prior provider--> appears plan was to wean completely as previously on higher/more frequent doses including fentanyl patch and percocet. -->taken off after suicide attempt in early 2017 but had severe pain and I started her back on 1/2 tablet BID of vicodin 5/325mg . She has had worsening pain since that time. Worsens even on day if she takes a trial off- hurts worse the next day even with medicine. Patient tells me psychiatry told her pain must be better controlled for them to better manage her depression A/P: Discussed with patient that increasing doses concerned me- I think she needs to look into alternative medication therapies. Will refer to pain management at this time. I have seen patient for abotu 15 months and notes on pain management are listed below my name at bottom of note.

## 2016-03-27 NOTE — Progress Notes (Signed)
Pre visit review using our clinic review tool, if applicable. No additional management support is needed unless otherwise documented below in the visit note. 

## 2016-03-27 NOTE — Progress Notes (Signed)
Subjective:  Danielle Rocha is a 65 y.o. year old very pleasant female patient who presents for/with See problem oriented charting ROS- has hip and low back pain that seemed to have worsen lately. No fever, chills, nausea, vomiting.see any ROS included in HPI as well.   Past Medical History-  Patient Active Problem List   Diagnosis Date Noted  . Suicidal overdose (HCC) 11/22/2015    Priority: High  . Atrial fibrillation (HCC)     Priority: High  . Chronic pain syndrome     Priority: High  . Depression     Priority: Medium  . GERD (gastroesophageal reflux disease) 01/30/2016    Medications- reviewed and updated Current Outpatient Prescriptions  Medication Sig Dispense Refill  . atorvastatin (LIPITOR) 20 MG tablet Take 1 tablet (20 mg total) by mouth daily. 30 tablet 5  . divalproex (DEPAKOTE) 500 MG DR tablet Take 500 mg by mouth 2 (two) times daily.    . flecainide (TAMBOCOR) 100 MG tablet Take 1 tablet (100 mg total) by mouth 2 (two) times daily. 60 tablet 0  . HYDROcodone-acetaminophen (NORCO/VICODIN) 5-325 MG tablet Take 0.5 tablets by mouth 2 (two) times daily as needed for moderate pain or severe pain (may fill in 1 month). 30 tablet 0  . Melatonin 3 MG TABS Take 2 tablets by mouth.    . OLANZapine (ZYPREXA) 20 MG tablet Take 20 mg by mouth at bedtime.    Marland Kitchen. omeprazole (PRILOSEC) 20 MG capsule Take 1 capsule (20 mg total) by mouth 2 (two) times daily before a meal. 60 capsule 6  . rivaroxaban (XARELTO) 20 MG TABS tablet Take 1 tablet (20 mg total) by mouth daily with supper. 30 tablet 0  . traZODone (DESYREL) 50 MG tablet Take 50 mg by mouth at bedtime.     No current facility-administered medications for this visit.    Objective: BP 94/62 mmHg  Pulse 72  Temp(Src) 97.7 F (36.5 C) (Oral)  Ht 5\' 7"  (1.702 m)  Wt 148 lb (67.132 kg)  BMI 23.17 kg/m2  SpO2 97% Gen: NAD, resting comfortably CV: RRR no murmurs rubs or gallops Lungs: CTAB no crackles, wheeze,  rhonchi Abdomen: soft/nontender/nondistended/normal bowel sounds. No rebound or guarding.  MSK: joint line pain on knee, reports pain with motion of hips.  Ext: no edema Skin: warm, dry, no rash over low back Neuro: intact 5/5 strength lower extremities  Assessment/Plan:  Right knee pain - Plan: Ambulatory referral to Orthopedics S: trialed injection last month- improved pain for about 3 weeks before worsening. This pain started after a fall after her overdose attempt A/P: refer to orthopedics- she requested repeat injection but discussed with short duration of improvement of pain would not advise repeat at this time unless ortho advises  Chronic pain syndrome S:On disability.  Pain contract 12/31/15. History of bilateral hip pain, low back pain since at least 1998. Workup by orthopedics in another location in past (piriformis release 1999, multiple injections into back and hips), neurosurgery(laminectomy l4-l5 2000) , pain management (did not want to try spinal stimulator as final option given) and continues to have hip and back pain.  When I inherited patient she was on Gabapentin 400 BID, percocet 1 tablet daily per prior provider--> appears plan was to wean completely as previously on higher/more frequent doses including fentanyl patch and percocet. -->taken off after suicide attempt in early 2017 but had severe pain and I started her back on 1/2 tablet BID of vicodin 5/325mg . She has had worsening  pain since that time. Worsens even on day if she takes a trial off- hurts worse the next day even with medicine. Patient tells me psychiatry told her pain must be better controlled for them to better manage her depression A/P: Discussed with patient that increasing doses concerned me- I think she needs to look into alternative medication therapies. Will refer to pain management at this time. I have seen patient for abotu 15 months and notes on pain management are listed below my name at bottom of note.     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 Previously listed as anaphylaxis but we went through symptoms and she firmly states only a rash. She wants to trial benadryl before eating fish- advised she could trial 25mg  benadryl an hour before. Do this where she has close access to hospital but given reported prior symptoms- suspect she may be able to toelrate.   Return in about 2 months (around 05/28/2016).  Orders Placed This Encounter  Procedures  . Ambulatory referral to Pain Clinic    Referral Priority:  Routine    Referral Type:  Consultation    Referral Reason:  Specialty Services Required    Requested Specialty:  Pain Medicine    Number of Visits Requested:  1  . Ambulatory referral to Orthopedics    Referral Priority:  Routine    Referral Type:  Consultation    Number of Visits Requested:  1    Meds ordered this encounter  Medications  . traZODone (DESYREL) 50 MG tablet    Sig: Take 50 mg by mouth at bedtime.  . Melatonin 3 MG TABS    Sig: Take 2 tablets by mouth.  Marland Kitchen. HYDROcodone-acetaminophen (NORCO/VICODIN) 5-325 MG tablet    Sig: Take 0.5-1 tablets by mouth 2 (two) times daily as needed for moderate pain or severe pain (may fill today. to last 1 month.).    Dispense:  35 tablet    Refill:  0   The duration of face-to-face time during this visit was 25 minutes. Greater than 50% of this time was spent in counseling, explanation of diagnosis, planning of further management, and/or coordination of care.     Return precautions advised.  Tana ConchStephen Jamaal Bernasconi, MD    History of chronic pain  01/30/2016 Office Visit Written 01/30/2016 11:09 AM by Shelva MajesticStephen O Shawnda Mauney, MD   S: continues 1/2 tab BID for bilateral hip and low back pain. Has had right knee pain as well since her fall at time of overdose. Walking 4/10 and wosrse with squatting/bending- we did therapeutic/diagnostic trial of injection for OA vs. Meniscal tear a month ago. Still trouble with kneeling or squatting up to 7/10.  Walking no issues. Occasionally hears click or pop.  A/P: Knee is doing better at this point- offered ortho referral and declines. Reasonable control on 1/2 tablet hydrocodone 5mg  twice a day. Discussed tolerance and that more sparing use may give her greater benefit in long run.           12/31/2015 Office Visit Written 12/31/2015 12:21 PM by Shelva MajesticStephen O Anabell Swint, MD   S: Patient continues to have bilateral hip and low back pain which are helped some by1/2 tab BID vicodin. When she had her overdose - fell and landed on right knee and has been in pain ever since. Most pain along joint line. Worse with squatting, bending, sometimes even sitting but walking also causes pain. Pain with walking 4/10 which is less than her pain with squatting/bending.  A/P: Continue  vicodin 1/2 tab BID. We also did a therapeutic and diagnostic injection into right knee- suspect OA vs. Degenerative meniscal tear. Walking pain down to 1/10 from 4/10 with treatment.                 11/29/2015 Office Visit Written 11/29/2015 12:28 PM by Shelva Majestic, MD   S: Off pain medicine for a week and has slept only about 2 hours a night due to severe pain.  A/P: remain off gabapentin. We will go back to 1/2 tab BID and given prior SI close follow up next month- if in more stable place at that point may move out to 3 months. Need pain contract next visit.                 08/31/2015 Office Visit Written 08/31/2015 10:51 AM by Shelva Majestic, MD   S: vicodin 7.5 mg per day total was helping (1/2 tab of  TID) but pain level has increased some. Wants to trial percocet again. Focused mainly in bilateral hips but also bilateral low back. Still taking gabapentin A/P: we will go back to percocet 1 tablet daily primarily (she can take 1/2 tab BID). Did give extra tabs #4 for particularly tough days. This is max that will be prescribed.                 06/01/2015 Office Visit Written 06/01/2015 6:46 PM by Shelva Majestic, MD   S:Chronic pain syndrome- bilateral hip, low back. 1/2 tab vicodin 3x a day from percocet once a day. She notices this does not help as much with each dose but gives her more sustained results. Hoarse in afternoons or when pain gets worse. Hips hurt with activity A/P: refilled vicodin to use a total of 7.5mg  per day. Discussed could use  percocet total per day such as 1/2 tab BID but she would prefer TID dosing. Follow up 3 months.                 03/01/2015 Office Visit Written 03/01/2015 1:01 PM by Shelva Majestic, MD   Continued bilateral hip pain, low back pain. Percocet reportedly does not help very much but makes pain tolerable. She would prefer more level pain pattern and we opted to change to vicodin and may use 1/2 tab of 5/325 up to 3x a day. Patient has had extensive workup and multiple therapies tried. I am not sure referral would be beneficial. Plan would be not to go above 10 total of hydrocodone per day (current plan is 7.5). 3 months rx given. Will need pain contract with long term use and drug testing.                01/18/2015 Office Visit Written 01/18/2015 6:03 PM by Shelva Majestic, MD   S:On disability. History of bilateral hip pain, low back pain. States hip pain has been undiagnosed but had been considered to be bursitis in past. Later had back surgery and fusion and has had worsened pain in low back since that time. Gabapentin 400 BID, percocet 1 tablet daily per prior provider.  A/P: we will obtain records. If percocet is once daily, discussed I may continue depending on reason vs. Consider pain management or reduce to vicodin or even potentially take off.

## 2016-04-01 ENCOUNTER — Encounter: Payer: Self-pay | Admitting: Family Medicine

## 2016-04-16 ENCOUNTER — Encounter: Payer: Self-pay | Admitting: Psychiatry

## 2016-04-23 ENCOUNTER — Encounter (HOSPITAL_COMMUNITY): Payer: Self-pay

## 2016-04-23 ENCOUNTER — Emergency Department (HOSPITAL_COMMUNITY): Payer: BLUE CROSS/BLUE SHIELD

## 2016-04-23 ENCOUNTER — Telehealth: Payer: Self-pay | Admitting: Family Medicine

## 2016-04-23 ENCOUNTER — Emergency Department (HOSPITAL_COMMUNITY)
Admission: EM | Admit: 2016-04-23 | Discharge: 2016-04-23 | Disposition: A | Discharge status: Home / Self Care | Payer: BLUE CROSS/BLUE SHIELD | Attending: Emergency Medicine | Admitting: Emergency Medicine

## 2016-04-23 DIAGNOSIS — Y929 Unspecified place or not applicable: Secondary | ICD-10-CM | POA: Insufficient documentation

## 2016-04-23 DIAGNOSIS — S01111A Laceration without foreign body of right eyelid and periocular area, initial encounter: Secondary | ICD-10-CM | POA: Insufficient documentation

## 2016-04-23 DIAGNOSIS — Y999 Unspecified external cause status: Secondary | ICD-10-CM | POA: Diagnosis not present

## 2016-04-23 DIAGNOSIS — S62111A Displaced fracture of triquetrum [cuneiform] bone, right wrist, initial encounter for closed fracture: Secondary | ICD-10-CM | POA: Diagnosis not present

## 2016-04-23 DIAGNOSIS — S62616A Displaced fracture of proximal phalanx of right little finger, initial encounter for closed fracture: Secondary | ICD-10-CM | POA: Diagnosis not present

## 2016-04-23 DIAGNOSIS — R52 Pain, unspecified: Secondary | ICD-10-CM

## 2016-04-23 DIAGNOSIS — W19XXXA Unspecified fall, initial encounter: Secondary | ICD-10-CM

## 2016-04-23 DIAGNOSIS — M25562 Pain in left knee: Secondary | ICD-10-CM

## 2016-04-23 DIAGNOSIS — F329 Major depressive disorder, single episode, unspecified: Secondary | ICD-10-CM | POA: Insufficient documentation

## 2016-04-23 DIAGNOSIS — Z79899 Other long term (current) drug therapy: Secondary | ICD-10-CM | POA: Insufficient documentation

## 2016-04-23 DIAGNOSIS — S0990XA Unspecified injury of head, initial encounter: Secondary | ICD-10-CM | POA: Diagnosis present

## 2016-04-23 DIAGNOSIS — M1990 Unspecified osteoarthritis, unspecified site: Secondary | ICD-10-CM | POA: Diagnosis not present

## 2016-04-23 DIAGNOSIS — Y939 Activity, unspecified: Secondary | ICD-10-CM | POA: Insufficient documentation

## 2016-04-23 DIAGNOSIS — S62636A Displaced fracture of distal phalanx of right little finger, initial encounter for closed fracture: Secondary | ICD-10-CM | POA: Diagnosis not present

## 2016-04-23 DIAGNOSIS — S62606A Fracture of unspecified phalanx of right little finger, initial encounter for closed fracture: Secondary | ICD-10-CM

## 2016-04-23 DIAGNOSIS — I4891 Unspecified atrial fibrillation: Secondary | ICD-10-CM | POA: Diagnosis not present

## 2016-04-23 DIAGNOSIS — W109XXA Fall (on) (from) unspecified stairs and steps, initial encounter: Secondary | ICD-10-CM | POA: Insufficient documentation

## 2016-04-23 DIAGNOSIS — E785 Hyperlipidemia, unspecified: Secondary | ICD-10-CM | POA: Diagnosis not present

## 2016-04-23 DIAGNOSIS — S62101A Fracture of unspecified carpal bone, right wrist, initial encounter for closed fracture: Secondary | ICD-10-CM

## 2016-04-23 MED ORDER — ACETAMINOPHEN 325 MG PO TABS
650.0000 mg | ORAL_TABLET | Freq: Once | ORAL | Status: AC
  Administered 2016-04-23: 650 mg via ORAL
  Filled 2016-04-23: qty 2

## 2016-04-23 NOTE — ED Provider Notes (Signed)
[facility]-EMERGENCY DEPT Provider Note   CSN: [id number] Arrival date & time: 04/23/16  1358  First Provider Contact:  First MD Initiated Contact with Patient 04/23/16 1439        History   Chief Complaint Chief Complaint  Patient presents with  . Fall  . Head Injury  . Wrist Pain  . Hand Pain    HPI Danielle Rocha is a 65 y.o. female.  Patient presents to the emergency department after falling out of her back steps today and injuring her right face, left hand, right wrist, left knee.  Her pain is mild in severity.  She does not want any medication besides Tylenol for pain right now.  She is on Xarelto.  She denies loss consciousness.  She does report mild right-sided headache at this time.  She denies nausea and vomiting.  She denies back pain.  She does report some mild neck pain with palpation.  She has been ambulatory since the fall.  No chest pain shortness of breath.  Denies abdominal pain.   The history is provided by the patient and a friend.    Past Medical History:  Diagnosis Date  . Arthritis    DJD, low back, thumb  . Atrial fibrillation (HCC)    Xarelto anticoagulation. Flecainide antiarrythmic   . Chicken pox   . Chronic pain syndrome    On disability. History of bilateral hip pain, low back pain. Gabapentin 400 BID, percocet 1 tablet daily per prior provider.   . Colon polyp    awaiting records  . Depression    zoloft 100mg , remeron 30mg  per psychiatry. ambien 10mg  per psychiatry to help wtih sleep element.   . Dystonia    described as psychogenic dystonia. Pain and twisting from upper chest and up ith triggers "wind, creamy food" on TID ativan per psychiatry previously.   . Fibromyalgia   . GERD (gastroesophageal reflux disease)    omeprazole OTC  . Goiter    states multiple imaging tests, has had biopsies  . Hyperlipidemia    lovastatin 20mg   . Stroke (HCC)    TIA (left side of face and body decreased sensitivity than right face and side)  . TIA  (transient ischemic attack)   . Vaginal atrophy    estrace vaginal cream    Patient Active Problem List   Diagnosis Date Noted  . GERD (gastroesophageal reflux disease) 01/30/2016  . Suicidal overdose (HCC) 11/22/2015  . Atrial fibrillation (HCC)   . Chronic pain syndrome   . Depression     Past Surgical History:  Procedure Laterality Date  . ATRIAL FIBRILLATION ABLATION    . BIOPSY THYROID    . fusion l4-l5  2000  . LAMINECTOMY     L4-L5  . OTHER SURGICAL HISTORY     tennis elbow surgery  . piriformis release  1999  . TONSILLECTOMY AND ADENOIDECTOMY  age 60  . VAGINAL HYSTERECTOMY  1993    OB History    Gravida Para Term Preterm AB Living   3 3 2 1   3    SAB TAB Ectopic Multiple Live Births                   Home Medications    Prior to Admission medications   Medication Sig Start Date End Date Taking? Authorizing Provider  acetaminophen (TYLENOL) 500 MG tablet Take 1,000 mg by mouth every 6 (six) hours as needed for mild pain, moderate pain, fever or headache.   Yes Historical  Provider, MD  atorvastatin (LIPITOR) 20 MG tablet Take 1 tablet (20 mg total) by mouth daily. 12/28/15  Yes Shelva Majestic, MD  divalproex (DEPAKOTE) 500 MG DR tablet Take 1,000 mg by mouth at bedtime.    Yes Historical Provider, MD  flecainide (TAMBOCOR) 100 MG tablet Take 1 tablet (100 mg total) by mouth 2 (two) times daily. 11/27/15  Yes Adonis Brook, NP  gabapentin (NEURONTIN) 300 MG capsule Take 300 mg by mouth daily with breakfast. 04/17/16  Yes Historical Provider, MD  HYDROcodone-acetaminophen (NORCO/VICODIN) 5-325 MG tablet Take 0.5-1 tablets by mouth 2 (two) times daily as needed for moderate pain or severe pain (may fill today. to last 1 month.). 03/27/16  Yes Shelva Majestic, MD  Melatonin 3 MG TABS Take 2 tablets by mouth at bedtime.    Yes Historical Provider, MD  Multiple Vitamin (MULTIVITAMIN WITH MINERALS) TABS tablet Take 1 tablet by mouth daily.   Yes Historical Provider, MD    OLANZapine (ZYPREXA) 20 MG tablet Take 20 mg by mouth at bedtime.   Yes Historical Provider, MD  omeprazole (PRILOSEC) 20 MG capsule Take 1 capsule (20 mg total) by mouth 2 (two) times daily before a meal. Patient taking differently: Take 40 mg by mouth daily with breakfast.  01/30/16 04/23/16 Yes Shelva Majestic, MD  rivaroxaban (XARELTO) 20 MG TABS tablet Take 1 tablet (20 mg total) by mouth daily with supper. 11/27/15  Yes Adonis Brook, NP  traZODone (DESYREL) 100 MG tablet Take 200 mg by mouth at bedtime. 03/21/16  Yes Historical Provider, MD    Family History Family History  Problem Relation Age of Onset  . Alcohol abuse Mother   . Hypertension Father   . Alcohol abuse Father   . Pancreatic cancer Father   . Cancer Brother     spindle cell RLE  . Cancer Brother     tonsil cancer  . Skin cancer Brother     Social History Social History  Substance Use Topics  . Smoking status: Never Smoker  . Smokeless tobacco: Never Used  . Alcohol use No     Allergies   Fish allergy; Reglan [metoclopramide]; Ciprofloxacin; Clindamycin/lincomycin; Vibramycin [doxycycline]; Macrobid [nitrofurantoin monohyd macro]; and Sulfa antibiotics   Review of Systems Review of Systems  All other systems reviewed and are negative.    Physical Exam Updated Vital Signs BP 118/62   Pulse 72   Temp 98.9 F (37.2 C) (Oral)   Resp 16   SpO2 98%   Physical Exam  Constitutional: She is oriented to person, place, and time. She appears well-developed and well-nourished. No distress.  HENT:  Significant swelling and bruising of the right para will region.  I am able to open the right eyelid and it does note that she has normal extraocular movement and normal vision out of her right eye.  Right anterior maxillary sinus tenderness.  No trismus or malocclusion.  Dentition is intact.  Eyes: EOM are normal.  Neck: Neck supple.  Mild cervical and paracervical tenderness without cervical step-off.   Cardiovascular: Normal rate, regular rhythm and normal heart sounds.   Pulmonary/Chest: Effort normal and breath sounds normal.  Abdominal: Soft. She exhibits no distension. There is no tenderness.  Musculoskeletal:  Painful range of motion of right wrist with bruising over the distal right ulna.  Bruising and deformity of the left little finger.  Bruising without deformity of the left anterior knee and patella region.  Full range of motion of left knee.  Full range of motion bilateral hips and ankles.  Mild pain with range of motion of the right elbow without obvious deformity or swelling.  Neurological: She is alert and oriented to person, place, and time.  Skin: Skin is warm and dry.  Psychiatric: She has a normal mood and affect. Judgment normal.  Nursing note and vitals reviewed.    ED Treatments / Results  Labs (all labs ordered are listed, but only abnormal results are displayed) Labs Reviewed - No data to display  EKG  EKG Interpretation None       Radiology Dg Elbow Complete Right  Result Date: 04/23/2016 CLINICAL DATA:  Larey Seat down steps onto driveway today, generalized RIGHT elbow pain, initial encounter EXAM: RIGHT ELBOW - COMPLETE 3+ VIEW COMPARISON:  Non FINDINGS: Osseous demineralization. Joint spaces preserved. No acute fracture, dislocation or bone destruction. No elbow joint effusion. IMPRESSION: No acute osseous abnormalities. Electronically Signed   By: Ulyses Southward M.D.   On: 04/23/2016 16:03   Dg Wrist Complete Right  Result Date: 04/23/2016 CLINICAL DATA:  Larey Seat down steps onto driveway earlier today, RIGHT wrist pain EXAM: RIGHT WRIST - COMPLETE 3+ VIEW COMPARISON:  None FINDINGS: Diffuse osseous demineralization. Degenerative changes at STT joint and first CMC joint. Small avulsion fracture identified on lateral view likely arising from the dorsal margin of the triquetrum. Overlying soft tissue swelling. No additional fracture, dislocation or bone destruction.  IMPRESSION: Avulsion fracture from dorsal margin of RIGHT triquetrum with overlying soft tissue swelling. Electronically Signed   By: Ulyses Southward M.D.   On: 04/23/2016 16:02   Dg Knee Complete 4 Views Left  Result Date: 04/23/2016 CLINICAL DATA:  Patient fell down steps on driveway earlier today. Patellar pain. EXAM: LEFT KNEE - COMPLETE 4+ VIEW COMPARISON:  07/23/2015. FINDINGS: No evidence of fracture, dislocation, or joint effusion. No evidence of arthropathy or other focal bone abnormality. Soft tissues are unremarkable. Chronic ossification or ossicle of the patellar tendon near its insertion with patella, unchanged. IMPRESSION: No visible fracture or effusion. Electronically Signed   By: Elsie Stain M.D.   On: 04/23/2016 16:02   Dg Hand Complete Left  Result Date: 04/23/2016 CLINICAL DATA:  Larey Seat down steps onto driveway earlier today, LEFT hand pain especially at thumb and little finger, initial encounter EXAM: LEFT HAND - COMPLETE 3+ VIEW COMPARISON:  None FINDINGS: Diffuse osseous demineralization. Fingers superimposed on lateral view limiting assessment. Suspected volar plate avulsion fracture at base of middle phalanx LEFT little finger, though this is partially obscured on the lateral view. Minimal scattered degenerative changes of IP joints. No additional fracture, dislocation, or bone destruction. IMPRESSION: Probable volar plate avulsion fracture at base of middle phalanx LEFT little finger. Osseous demineralization. Electronically Signed   By: Ulyses Southward M.D.   On: 04/23/2016 16:00    Procedures Procedures (including critical care time)   +++++++++++++++++++++++++++++++++++++++++++++++++++++++++  SPLINT APPLICATION Authorized by: Lyanne Co Consent: Verbal consent obtained. Risks and benefits: risks, benefits and alternatives were discussed Consent given by: patient Splint applied by: orthopedic technician Location details: Left upper extremity  Splint type: Velcro thumb  spica  Supplies used: Velcro thumb spica  Post-procedure: The splinted body part was neurovascularly unchanged following the procedure. Patient tolerance: Patient tolerated the procedure well with no immediate complications.    LACERATION REPAIR Performed by: Lyanne Co Consent: Verbal consent obtained. Risks and benefits: risks, benefits and alternatives were discussed Patient identity confirmed: provided demographic data Time out performed prior to procedure Prepped and Draped in  normal sterile fashion Wound explored Laceration Location: Right upper eyelid Laceration Length: 1 cm No Foreign Bodies seen or palpated Anesthesia: None  Irrigation method: syringe Amount of cleaning: standard Skin closure: Dermabond  Number of sutures or staples: Dermabond  Technique: Dermabond  Patient tolerance: Patient tolerated the procedure well with no immediate complications.    +++++++++++++++++++++++++++++++++++++++++++++++   Medications Ordered in ED Medications  acetaminophen (TYLENOL) tablet 650 mg (650 mg Oral Given 04/23/16 1606)     Initial Impression / Assessment and Plan / ED Course  I have reviewed the triage vital signs and the nursing notes.  Pertinent labs & imaging results that were available during my care of the patient were reviewed by me and considered in my medical decision making (see chart for details).  Clinical Course    Mechanical fall.  CT imaging of the head, face, C-spine without pathology.  Large periorbital swelling and edema neck emesis.  Her underlying eyes normal and her vision out of size normal.  Laceration repaired with Dermabond.  In regards to her right wrist fracture she is placed in a Velcro thumb spica and will follow-up with hand surgery.  The same will occur for her left little finger fracture.  Patient has pain medication at home.  She understands to return to the ER for new or worsening symptoms.  Final Clinical Impressions(s) / ED  Diagnoses   Final diagnoses:  Pain  Fall    New Prescriptions New Prescriptions   No medications on file     Azalia Bilis, MD 04/23/16 1820

## 2016-04-23 NOTE — Telephone Encounter (Signed)
I do not see evidence that she went to ED unfortunately- please give her one more call this afternoon

## 2016-04-23 NOTE — ED Notes (Signed)
Patient transported to CT 

## 2016-04-23 NOTE — ED Notes (Signed)
MD at bedside. EDP CAMPOS PRESENT TO EVALUATE PT 

## 2016-04-23 NOTE — Telephone Encounter (Signed)
Patient Name: Danielle Rocha  DOB: 1950/10/22    Initial Comment fell down stairs. right eye, check swollen, cut above right eyebrow that is still bleeding some. right wrist is very painful    Nurse Assessment  Nurse: Scarlette Ar, RN, Herbert Seta Date/Time (Eastern Time): 04/23/2016 11:11:09 AM  Confirm and document reason for call. If symptomatic, describe symptoms. You must click the next button to save text entered. ---Caller states that she fell down her back stairs this morning at 10 am. right eye, cheek swollen, cut above right eyebrow that is still bleeding some. right wrist is very painful  Has the patient traveled out of the country within the last 30 days? ---Not Applicable  Does the patient have any new or worsening symptoms? ---Yes  Will a triage be completed? ---Yes  Related visit to physician within the last 2 weeks? ---No  Does the PT have any chronic conditions? (i.e. diabetes, asthma, etc.) ---Yes  List chronic conditions. ---See MR  Is this a behavioral health or substance abuse call? ---No     Guidelines    Guideline Title Affirmed Question Affirmed Notes  Face Injury Taking Coumadin (warfarin) or other strong blood thinner, or known bleeding disorder (e.g., thrombocytopenia)    Final Disposition User   Go to ED Now (or PCP triage) Scarlette Ar, RN, Heather    Referrals  [facility] - ED   Disagree/Comply: Comply

## 2016-04-23 NOTE — Telephone Encounter (Signed)
Called patient and left a detailed voicemail letting her know we had received her message about falling and that I was calling to make sure she had followed up at the ED. Awaiting return phone call at this time.

## 2016-04-23 NOTE — Telephone Encounter (Signed)
Please Advise

## 2016-04-23 NOTE — ED Triage Notes (Signed)
Pt c/o laceration to R forehead, R wrist pain, and L pinky finger pain w/ laceration r/t falling down brick steps onto concrete this afternoon.  Pain score 6/10.  Pressure dressing applied to head lac.  Pt takes Xarelto.  Swelling noted to L pinky finger.  Denies LOC.

## 2016-04-23 NOTE — ED Notes (Addendum)
Patient transported to x-ray. ?

## 2016-04-24 NOTE — Telephone Encounter (Signed)
As long as she was seen- that's the most important thing. Thanks for following up

## 2016-04-24 NOTE — Telephone Encounter (Signed)
I found the note this morning where the patient was seen in the ED yesterday. Right wrist fracture with velcro thumb spica placed and follow up with hand surgeon. Left little finger fracture. Dermabond to laceration on the face. I did call the patient this morning to try to see how she is feeling but her voicemail box is full and I was unable to leave a message. I will try to call her again this afternoon.

## 2016-04-29 ENCOUNTER — Telehealth: Payer: Self-pay | Admitting: Family Medicine

## 2016-04-29 ENCOUNTER — Encounter: Payer: Self-pay | Admitting: Family Medicine

## 2016-04-29 ENCOUNTER — Ambulatory Visit (INDEPENDENT_AMBULATORY_CARE_PROVIDER_SITE_OTHER): Payer: Medicare Other | Admitting: Family Medicine

## 2016-04-29 VITALS — BP 112/70 | HR 82 | Resp 12 | Ht 67.0 in | Wt 149.2 lb

## 2016-04-29 DIAGNOSIS — S0993XD Unspecified injury of face, subsequent encounter: Secondary | ICD-10-CM | POA: Diagnosis not present

## 2016-04-29 DIAGNOSIS — S01111D Laceration without foreign body of right eyelid and periocular area, subsequent encounter: Secondary | ICD-10-CM

## 2016-04-29 DIAGNOSIS — W19XXXA Unspecified fall, initial encounter: Secondary | ICD-10-CM

## 2016-04-29 MED ORDER — MUPIROCIN CALCIUM 2 % EX CREA: 1.0000 "application " | TOPICAL_CREAM | Freq: Two times a day (BID) | CUTANEOUS | 0 refills | Status: AC

## 2016-04-29 NOTE — Patient Instructions (Signed)
A few things to remember from today's visit:   Fall, accidental  Facial trauma, subsequent encounter  Laceration, eyelid, right, subsequent encounter  A few tips:  -As we age balance is not as good as it was, so there is a higher risks for falls. Please remove small rugs and furniture that is "in your way" and could increase the risk of falls. Stretching exercises may help with fall prevention: Yoga and Tai Chi are some examples. Low impact exercise is better, so you are not very achy the next day.  -Sun screen and avoidance of direct sun light recommended. Mix antibiotic with sun screening and apply on laceration while it is healing.    Please be sure medication list is accurate. If a new problem present, please set up appointment sooner than planned today.

## 2016-04-29 NOTE — Telephone Encounter (Signed)
[facility] - Client TELEPHONE ADVICE RECORD [facility]  Patient Name: Danielle Rocha  DOB: 11/15/1950    Initial Comment Caller states fell last Wed, on blood thinners, eye very swollen, two lacs were glued, very hard    Nurse Assessment  Nurse: Laural BenesJohnson, RN, Dondra SpryGail Date/Time (Eastern Time): 04/29/2016 9:23:04 AM  Confirm and document reason for call. If symptomatic, describe symptoms. You must click the next button to save text entered. ---Neysa Bonitohristy took fall last week right eye swollen -- has large knot that is hard on right eyebrow and a place on right cheek she is also concerned with. eye doctor follow up on Wednesday  Has the patient traveled out of the country within the last 30 days? ---No  Does the patient have any new or worsening symptoms? ---Yes  Will a triage be completed? ---Yes  Related visit to physician within the last 2 weeks? ---N/A  Does the PT have any chronic conditions? (i.e. diabetes, asthma, etc.) ---Unknown  Is this a behavioral health or substance abuse call? ---No     Guidelines    Guideline Title Affirmed Question Affirmed Notes  Eye - Swelling [1] SEVERE eyelid swelling on one side AND [2] red and painful (or tender to touch)    Final Disposition User   See Physician within 4 Hours (or PCP triage) Laural BenesJohnson, RN, Dondra SpryGail    Comments  NOTE; Patient took nasty fall down concrete steps landing on face in concrete driveway; has fracture of wrist; severe fingers injured right eye was oozing --under eye doctor care follow up Wednesday; two lacerations glued on face hard knot to right eyebrow with swelling she is concerned about. NO AVAILABLE APPT WITH DR. HUNTER -- Given appt today with [place], Betty 04-29-2016 1115[place] [facility] [place]   Referrals  REFERRED TO PCP OFFICE   Disagree/Comply: Comply

## 2016-04-29 NOTE — Telephone Encounter (Signed)
Appointment with Dr. [place] at 11:15.  Pt. of Dr. Durene CalHunter.

## 2016-04-29 NOTE — Progress Notes (Signed)
HPI:  ACUTE VISIT:  Chief Complaint  Patient presents with  . Follow-up    Ms.Aminah Zabawa is a 65 y.o. female, who is here today with her sister to follow on recent fall and ER visit.  She presented to the ER on 04/23/2016 after fall that happened at home. According to patient, she was "rushing" because she needed to pick up her son and she was running late, she went down stairs fast, missed a step and fell. Sister states that there is a broken brick on one step and she may have tripped causing the fall. This happened around 10 AM, she denies any loss of consciousness. After fall she got up and drove to pick up her son, went back home, and later same day went to the ER. She had laceration of right upper eye lid, right wrist fracture and left 5th finger fracture.  Hand X WUJ:[id number] volar plate avulsion fracture at base of middle phalanx LEFT little finger. Right wrist X [id number] fracture from dorsal margin of RIGHT triquetrum with overlying soft tissue swelling.  Head CT: No acute infarct, hemorrhage, or mass lesion is present.  Face/maxillary CT:  1. Large right periorbital hematoma is predominantly within the upper eyelid and extending laterally. There is no underlying fracture. 2. Soft tissue swelling extends over the right maxilla without underlying fracture.  She is on chronic anticoagulation, Xarelto for atrial fib.  She already followed with ortho and her eye care provider. She denies any headache, visual changes, focal deficit, numbness, tingling. She has an orthopedic appointment 05/06/2016 and she will follow with her eye doctor 04/30/2016.   She had a prior fall in March; 2017, according to patient she had fell because she was drowsy after intentional overdose  Of Ativan and Norco. She is currently following with psychiatrists.  No other concerns today.  Review of Systems  Constitutional: Negative for activity change, appetite change, fatigue, fever and  unexpected weight change.  HENT: Positive for facial swelling. Negative for congestion, nosebleeds, sore throat, trouble swallowing and voice change.   Eyes: Negative for photophobia, pain, redness and visual disturbance.  Respiratory: Negative for cough, shortness of breath, wheezing and stridor.   Gastrointestinal: Negative for abdominal pain, nausea and vomiting.       Negative for changes in bowel habits.  Musculoskeletal: Positive for back pain. Negative for joint swelling.  Skin: Positive for wound. Negative for pallor.  Neurological: Negative for seizures, syncope, weakness, numbness and headaches.  Psychiatric/Behavioral: Negative for agitation and confusion.      Current Outpatient Prescriptions on File Prior to Visit  Medication Sig Dispense Refill  . acetaminophen (TYLENOL) 500 MG tablet Take 1,000 mg by mouth every 6 (six) hours as needed for mild pain, moderate pain, fever or headache.    Marland Kitchen atorvastatin (LIPITOR) 20 MG tablet Take 1 tablet (20 mg total) by mouth daily. 30 tablet 5  . divalproex (DEPAKOTE) 500 MG DR tablet Take 1,000 mg by mouth at bedtime.     . flecainide (TAMBOCOR) 100 MG tablet Take 1 tablet (100 mg total) by mouth 2 (two) times daily. 60 tablet 0  . gabapentin (NEURONTIN) 300 MG capsule Take 300 mg by mouth daily with breakfast.    . HYDROcodone-acetaminophen (NORCO/VICODIN) 5-325 MG tablet Take 0.5-1 tablets by mouth 2 (two) times daily as needed for moderate pain or severe pain (may fill today. to last 1 month.). 35 tablet 0  . Melatonin 3 MG TABS Take 2 tablets by mouth  at bedtime.     . Multiple Vitamin (MULTIVITAMIN WITH MINERALS) TABS tablet Take 1 tablet by mouth daily.    Marland Kitchen OLANZapine (ZYPREXA) 20 MG tablet Take 20 mg by mouth at bedtime.    . rivaroxaban (XARELTO) 20 MG TABS tablet Take 1 tablet (20 mg total) by mouth daily with supper. 30 tablet 0  . traZODone (DESYREL) 100 MG tablet Take 200 mg by mouth at bedtime.    Marland Kitchen omeprazole (PRILOSEC) 20  MG capsule Take 1 capsule (20 mg total) by mouth 2 (two) times daily before a meal. (Patient taking differently: Take 40 mg by mouth daily with breakfast. ) 60 capsule 6   No current facility-administered medications on file prior to visit.      Past Medical History:  Diagnosis Date  . Arthritis    DJD, low back, thumb  . Atrial fibrillation (HCC)    Xarelto anticoagulation. Flecainide antiarrythmic   . Chicken pox   . Chronic pain syndrome    On disability. History of bilateral hip pain, low back pain. Gabapentin 400 BID, percocet 1 tablet daily per prior provider.   . Colon polyp    awaiting records  . Depression    zoloft 100mg , remeron 30mg  per psychiatry. ambien 10mg  per psychiatry to help wtih sleep element.   . Dystonia    described as psychogenic dystonia. Pain and twisting from upper chest and up ith triggers "wind, creamy food" on TID ativan per psychiatry previously.   . Fibromyalgia   . GERD (gastroesophageal reflux disease)    omeprazole OTC  . Goiter    states multiple imaging tests, has had biopsies  . Hyperlipidemia    lovastatin 20mg   . Stroke (HCC)    TIA (left side of face and body decreased sensitivity than right face and side)  . TIA (transient ischemic attack)   . Vaginal atrophy    estrace vaginal cream   Allergies  Allergen Reactions  . Fish Allergy Rash    Shell Fish   . Reglan [Metoclopramide] Other (See Comments)    Suicidal thoughts  . Ciprofloxacin Diarrhea  . Clindamycin/Lincomycin Diarrhea  . Vibramycin [Doxycycline] Diarrhea  . Macrobid [Nitrofurantoin Monohyd Macro] Rash    Rash on lip  . Sulfa Antibiotics Rash    Social History   Social History  . Marital status: Married    Spouse name: N/A  . Number of children: N/A  . Years of education: N/A   Social History Main Topics  . Smoking status: Never Smoker  . Smokeless tobacco: Never Used  . Alcohol use No  . Drug use: No  . Sexual activity: No   Other Topics Concern  .  None   Social History Narrative   Family: Married. Husband works as Hospital doctor for [employer]. 2 children from previous marriage 1 from current. Son Jeannett Senior lives with them and has down's syndrome.       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 Moved to [facility] from [id number] and became disabled in that time frame due to hip/back issues.       Hobbies: time with son    Vitals:   04/29/16 1115  BP: 112/70  Pulse: 82  Resp: 12   Body mass index is 23.38 kg/m.   O2 sat 98% at RA.   Physical Exam  Nursing note and vitals reviewed. Constitutional: She is oriented to person, place, and time. She appears well-developed. No distress.  HENT:  Head: Atraumatic.  Eyes: Conjunctivae  and EOM are normal. Pupils are equal, round, and reactive to light.    Neck: Normal range of motion.  Cardiovascular: Normal rate and regular rhythm.   No murmur heard. Respiratory: Effort normal and breath sounds normal. No respiratory distress.  Musculoskeletal: She exhibits no edema.       Cervical back: She exhibits normal range of motion.  Mild tenderness upon palpation of the trapezium, R>L. no tenderness upon palpation of vertebral bodies cervical spine.  Lymphadenopathy:    She has no cervical adenopathy.  Neurological: She is alert and oriented to person, place, and time. She has normal strength. No cranial nerve deficit. Coordination and gait normal.  Skin: Skin is warm. Ecchymosis and laceration (Right upper eye lid.) noted. No rash noted. No erythema.     Facial ecchymosis involving R>L, periocular, cheeks, mandibular (right), and anterior aspect of neck.  Psychiatric: She has a normal mood and affect.  Well groomed, good eye contact.      ASSESSMENT AND PLAN:     Neysa BonitoChristy was seen today for follow-up.  Diagnoses and all orders for this visit:  Fall, accidental  Fall precautions discussed. Recommend fixing broken stair, which according to sister may  have caused this fall. I do not think PT is needed at this time.    Facial trauma, subsequent encounter  Explained that it will take some time for ecchymoses/hematoma to resolve completely. Wynelle LinkSun screen recommended as well as avoidance of direct sun light. Instructed about warning signs.  Laceration, eyelid, right, subsequent encounter  Keep area clean with soap and water. Ointment antibiotic mixed with sunscreen recommended applying on area while healing, this mayt help with healing process. Monitor for signs of infection. Keep appt with eye care provider.  -     mupirocin cream (BACTROBAN) 2 %; Apply 1 application topically 2 (two) times daily.        -Ms.Rosine DoorChristy Wenk advised to return or notify a doctor immediately if symptoms worsen or persist or new concerns arise.       Melis Trochez G. [place]az[place], MD  [facility]. [place] office.

## 2016-04-29 NOTE — Progress Notes (Signed)
Pre visit review using our clinic review tool, if applicable. No additional management support is needed unless otherwise documented below in the visit note. 

## 2016-04-30 ENCOUNTER — Ambulatory Visit: Payer: Self-pay | Admitting: Family Medicine

## 2016-05-01 ENCOUNTER — Other Ambulatory Visit: Payer: Self-pay | Admitting: Family Medicine

## 2016-05-27 ENCOUNTER — Telehealth: Payer: Self-pay | Admitting: Family Medicine

## 2016-05-27 ENCOUNTER — Other Ambulatory Visit: Payer: Self-pay

## 2016-05-27 MED ORDER — ATORVASTATIN CALCIUM 20 MG PO TABS: 20.0000 mg | ORAL_TABLET | Freq: Every day | ORAL | 5 refills | Status: DC

## 2016-05-27 NOTE — Telephone Encounter (Signed)
° °  Pt req 90 day supply of the following med   atorvastatin (LIPITOR) 20 MG tablet    [facility]

## 2016-05-27 NOTE — Telephone Encounter (Signed)
Prescription sent to Pharmacy as requested

## 2016-05-28 ENCOUNTER — Ambulatory Visit (INDEPENDENT_AMBULATORY_CARE_PROVIDER_SITE_OTHER): Payer: BLUE CROSS/BLUE SHIELD | Admitting: Family Medicine

## 2016-05-28 ENCOUNTER — Encounter: Payer: Self-pay | Admitting: Family Medicine

## 2016-05-28 DIAGNOSIS — G894 Chronic pain syndrome: Secondary | ICD-10-CM

## 2016-05-28 MED ORDER — HYDROCODONE-ACETAMINOPHEN 5-325 MG PO TABS: 0.5000 | ORAL_TABLET | Freq: Two times a day (BID) | ORAL | 0 refills | Status: DC | PRN

## 2016-05-28 NOTE — Progress Notes (Signed)
Pre visit review using our clinic review tool, if applicable. No additional management support is needed unless otherwise documented below in the visit note. 

## 2016-05-28 NOTE — Progress Notes (Signed)
Subjective:  Danielle Rocha is a 65 y.o. year old very pleasant female patient who presents for/with See problem oriented charting ROS- chronic hip, back, at least subacute knee pain. No fever/chills. Denies lightheadedness/dizziness.see any ROS included in HPI as well.   Past Medical History-  Patient Active Problem List   Diagnosis Date Noted  . Suicidal overdose (HCC) 11/22/2015    Priority: High  . Atrial fibrillation (HCC)     Priority: High  . Chronic pain syndrome     Priority: High  . Depression     Priority: Medium  . GERD (gastroesophageal reflux disease) 01/30/2016    Medications- reviewed and updated Current Outpatient Prescriptions  Medication Sig Dispense Refill  . acetaminophen (TYLENOL) 500 MG tablet Take 1,000 mg by mouth every 6 (six) hours as needed for mild pain, moderate pain, fever or headache.    Marland Kitchen atorvastatin (LIPITOR) 20 MG tablet Take 1 tablet (20 mg total) by mouth daily. 30 tablet 5  . divalproex (DEPAKOTE) 500 MG DR tablet Take 1,000 mg by mouth at bedtime.     . flecainide (TAMBOCOR) 100 MG tablet Take 1 tablet (100 mg total) by mouth 2 (two) times daily. 60 tablet 0  . gabapentin (NEURONTIN) 300 MG capsule Take 300 mg by mouth daily with breakfast.    . HYDROcodone-acetaminophen (NORCO/VICODIN) 5-325 MG tablet Take 0.5-1 tablets by mouth 2 (two) times daily as needed for moderate pain or severe pain (may fill today. to last 1 month.). 30 tablet 0  . Melatonin 3 MG TABS Take 2 tablets by mouth at bedtime.     . Multiple Vitamin (MULTIVITAMIN WITH MINERALS) TABS tablet Take 1 tablet by mouth daily.    Marland Kitchen OLANZapine (ZYPREXA) 20 MG tablet Take 20 mg by mouth at bedtime.    . rivaroxaban (XARELTO) 20 MG TABS tablet Take 1 tablet (20 mg total) by mouth daily with supper. 30 tablet 0  . traZODone (DESYREL) 100 MG tablet Take 200 mg by mouth at bedtime.    Marland Kitchen HYDROcodone-acetaminophen (NORCO/VICODIN) 5-325 MG tablet Take 0.5-1 tablets by mouth 2 (two) times  daily as needed for moderate pain or severe pain (may fill in 1 month). 30 tablet 0  . HYDROcodone-acetaminophen (NORCO/VICODIN) 5-325 MG tablet Take 0.5-1 tablets by mouth 2 (two) times daily as needed for moderate pain or severe pain (may fill in 2 months). 30 tablet 0  . omeprazole (PRILOSEC) 20 MG capsule Take 1 capsule (20 mg total) by mouth 2 (two) times daily before a meal. (Patient taking differently: Take 40 mg by mouth daily with breakfast. ) 60 capsule 6   No current facility-administered medications for this visit.     Objective: BP (!) 80/58 (BP Location: Left Arm, Patient Position: Sitting, Cuff Size: Normal)   Pulse 71   Temp 97.7 F (36.5 C) (Oral)   Wt 147 lb 9.6 oz (67 kg)   SpO2 96%   BMI 23.12 kg/m  Gen: NAD, resting comfortably Bruising around and below right eye, swelling in eyebrow where prior dermabond applied CV: RRR no murmurs rubs or gallops Lungs: CTAB no crackles, wheeze, rhonchi Abdomen: soft/nontender/nondistended/normal bowel sounds. No rebound or guarding.  Ext: no edema Skin: warm, dry Neuro: grossly normal, moves all extremities, normal gait  Assessment/Plan:  Chronic pain syndrome S:Pain management referral due to need for better pain control than on vicodin 5/325 1/2 tab BID. Pain remains in bilateral hips, low back btu also now in bilateral knees previously R>L but now L>R. She  was not accepted by [place] pain management- yet I was not aware of this rejection. We discussed referring again today- she states she has about 1/2 a bottle of hydrococone left (was given 70 total between 2 fills last visit). She has been trying to only use 1/2 tablet to full tablet at night and she is reasonably managing pain. Right now her left knee after most recent fall is most difficult thing (she tripped on a broken step which I encouraged her to get fixed).  A/P: we had a long discussion today about referral process and continued pain management. We ultimately  opted to have patient continue here with 1 tablet total daily of vicodin. She is going to see orthopedics tomorrow- if she gets information fromthem or does not feel that she is tolerating pain at next visit- may request referral again. For now- #90 total to last her 3 months of hyrocodone was given.    We also discussed her medication changes through psychiatry- she is on zyprexa 20mg , trazodone 200mg , depakote. She states was also placed on  Gabapentin but Felt loopy/confused on gabapentin- now stopped. She strongly denies her fall was related to meds- instead states she was rushing and tripped on stair as otherwise reported. I declined to encoruage gabapentin trial again although previously used for pain- instead encouraged her to follow up with psychiatry. Fortunately no SI reported with history of suicide attempt  We also discussed her blood pressure- may have just been hard to hear systolic- she is completely asymptomatic today except for her chronic pain.   3 months  Meds ordered this encounter  Medications  . HYDROcodone-acetaminophen (NORCO/VICODIN) 5-325 MG tablet    Sig: Take 0.5-1 tablets by mouth 2 (two) times daily as needed for moderate pain or severe pain (may fill today. to last 1 month.).    Dispense:  30 tablet    Refill:  0  . HYDROcodone-acetaminophen (NORCO/VICODIN) 5-325 MG tablet    Sig: Take 0.5-1 tablets by mouth 2 (two) times daily as needed for moderate pain or severe pain (may fill in 1 month).    Dispense:  30 tablet    Refill:  0  . HYDROcodone-acetaminophen (NORCO/VICODIN) 5-325 MG tablet    Sig: Take 0.5-1 tablets by mouth 2 (two) times daily as needed for moderate pain or severe pain (may fill in 2 months).    Dispense:  30 tablet    Refill:  0   The duration of face-to-face time during this visit was greater than 25 minutes. Greater than 50% of this time was spent in counseling, explanation of diagnosis, planning of further management, and/or coordination of  care including primarily management of chronic pain.     Return precautions advised.  Tana ConchStephen Deziree Mokry, MD

## 2016-05-28 NOTE — Assessment & Plan Note (Signed)
S:Pain management referral due to need for better pain control than on vicodin 5/325 1/2 tab BID. Pain remains in bilateral hips, low back btu also now in bilateral knees previously R>L but now L>R. She was not accepted by [place] [facility] pain management- yet I was not aware of this rejection. We discussed referring again today- she states she has about 1/2 a bottle of hydrococone left (was given 70 total between 2 fills last visit). She has been trying to only use 1/2 tablet to full tablet at night and she is reasonably managing pain. Right now her left knee after most recent fall is most difficult thing (she tripped on a broken step which I encouraged her to get fixed).  A/P: we had a long discussion today about referral process and continued pain management. We ultimately opted to have patient continue here with 1 tablet total daily of vicodin. She is going to see orthopedics tomorrow- if she gets information fromthem or does not feel that she is tolerating pain at next visit- may request referral again. For now- #90 total to last her 3 months of hyrocodone was given.

## 2016-05-28 NOTE — Patient Instructions (Signed)
As long as 1 hydrocodone a day or less, I am willing to continue to prescribe. 3 months given. Return to see me before running out  I am glad you have been doing better  Get that step fixed!

## 2016-06-24 ENCOUNTER — Telehealth: Payer: Self-pay | Admitting: Family Medicine

## 2016-06-24 NOTE — Telephone Encounter (Signed)
° ° °  Pt call to say her psychiatrist req her to have the following lab done and she would like to have that lab drawn here. Pt has the lab slip    Valproic acid

## 2016-06-25 ENCOUNTER — Other Ambulatory Visit: Payer: Self-pay

## 2016-06-25 DIAGNOSIS — Z79899 Other long term (current) drug therapy: Secondary | ICD-10-CM

## 2016-06-25 NOTE — Telephone Encounter (Signed)
Spoke with patient. I entered in the order for her to get her lab drawn and scheduled her for 2:00 tomorrow.

## 2016-06-26 ENCOUNTER — Other Ambulatory Visit (INDEPENDENT_AMBULATORY_CARE_PROVIDER_SITE_OTHER): Payer: BLUE CROSS/BLUE SHIELD

## 2016-06-26 DIAGNOSIS — Z79899 Other long term (current) drug therapy: Secondary | ICD-10-CM | POA: Diagnosis not present

## 2016-06-27 LAB — VALPROIC ACID LEVEL: Valproic Acid Lvl: 81.2 ug/mL (ref 50.0–100.0)

## 2016-08-06 ENCOUNTER — Telehealth: Payer: Self-pay | Admitting: Internal Medicine

## 2016-08-06 NOTE — Telephone Encounter (Signed)
Patient called in stating she is  [facility]. Per Patient they cannot schedule until they receive clearance from us first. Advised patient I will send message around and she is also aware Dr.Ross is not back in the office until Thursday. [facility] # [phone]

## 2016-08-08 NOTE — Telephone Encounter (Signed)
[facility].  They faxed a clearance form on 11/7 and will fax another one again at this time.

## 2016-08-11 NOTE — Telephone Encounter (Signed)
[facility] received and placed in Dr. Charlott Rakesoss's folder for her review.

## 2016-08-18 NOTE — Telephone Encounter (Signed)
Signed surgical clearance form placed in nurse fax bin in medical records to [facility].

## 2016-09-01 ENCOUNTER — Emergency Department (HOSPITAL_COMMUNITY)
Admission: EM | Admit: 2016-09-01 | Discharge: 2016-09-05 | Disposition: A | Discharge status: Other Acute Inpatient Hospital | Payer: BLUE CROSS/BLUE SHIELD | Attending: Emergency Medicine | Admitting: Emergency Medicine

## 2016-09-01 ENCOUNTER — Encounter (HOSPITAL_COMMUNITY): Payer: Self-pay | Admitting: Nurse Practitioner

## 2016-09-01 ENCOUNTER — Emergency Department (HOSPITAL_COMMUNITY): Payer: BLUE CROSS/BLUE SHIELD

## 2016-09-01 DIAGNOSIS — F332 Major depressive disorder, recurrent severe without psychotic features: Secondary | ICD-10-CM | POA: Diagnosis not present

## 2016-09-01 DIAGNOSIS — Z7901 Long term (current) use of anticoagulants: Secondary | ICD-10-CM | POA: Diagnosis not present

## 2016-09-01 DIAGNOSIS — R410 Disorientation, unspecified: Secondary | ICD-10-CM

## 2016-09-01 DIAGNOSIS — Z9889 Other specified postprocedural states: Secondary | ICD-10-CM | POA: Diagnosis not present

## 2016-09-01 DIAGNOSIS — T50902A Poisoning by unspecified drugs, medicaments and biological substances, intentional self-harm, initial encounter: Secondary | ICD-10-CM

## 2016-09-01 DIAGNOSIS — T43212A Poisoning by selective serotonin and norepinephrine reuptake inhibitors, intentional self-harm, initial encounter: Secondary | ICD-10-CM | POA: Diagnosis present

## 2016-09-01 DIAGNOSIS — T424X2A Poisoning by benzodiazepines, intentional self-harm, initial encounter: Secondary | ICD-10-CM | POA: Diagnosis not present

## 2016-09-01 DIAGNOSIS — Z9189 Other specified personal risk factors, not elsewhere classified: Secondary | ICD-10-CM | POA: Diagnosis present

## 2016-09-01 DIAGNOSIS — Z8673 Personal history of transient ischemic attack (TIA), and cerebral infarction without residual deficits: Secondary | ICD-10-CM | POA: Diagnosis not present

## 2016-09-01 DIAGNOSIS — T1491XA Suicide attempt, initial encounter: Secondary | ICD-10-CM | POA: Diagnosis not present

## 2016-09-01 DIAGNOSIS — Z79899 Other long term (current) drug therapy: Secondary | ICD-10-CM | POA: Diagnosis not present

## 2016-09-01 DIAGNOSIS — F325 Major depressive disorder, single episode, in full remission: Secondary | ICD-10-CM | POA: Diagnosis present

## 2016-09-01 LAB — URINALYSIS, COMPLETE (UACMP) WITH MICROSCOPIC
BILIRUBIN URINE: NEGATIVE
Glucose, UA: NEGATIVE mg/dL
KETONES UR: 5 mg/dL — AB
LEUKOCYTES UA: NEGATIVE
NITRITE: NEGATIVE
PROTEIN: 30 mg/dL — AB
SQUAMOUS EPITHELIAL / LPF: NONE SEEN
Specific Gravity, Urine: 1.021 (ref 1.005–1.030)
pH: 6 (ref 5.0–8.0)

## 2016-09-01 LAB — COMPREHENSIVE METABOLIC PANEL
ALT: 15 U/L (ref 14–54)
ANION GAP: 8 (ref 5–15)
AST: 22 U/L (ref 15–41)
Albumin: 3.9 g/dL (ref 3.5–5.0)
Alkaline Phosphatase: 45 U/L (ref 38–126)
BUN: 21 mg/dL — ABNORMAL HIGH (ref 6–20)
CHLORIDE: 107 mmol/L (ref 101–111)
CO2: 26 mmol/L (ref 22–32)
Calcium: 8.8 mg/dL — ABNORMAL LOW (ref 8.9–10.3)
Creatinine, Ser: 0.72 mg/dL (ref 0.44–1.00)
GFR calc non Af Amer: >60 mL/min (ref >60)
Glucose, Bld: 105 mg/dL — ABNORMAL HIGH (ref 65–99)
POTASSIUM: 3.4 mmol/L — AB (ref 3.5–5.1)
SODIUM: 141 mmol/L (ref 135–145)
Total Bilirubin: 0.7 mg/dL (ref 0.3–1.2)
Total Protein: 6.4 g/dL — ABNORMAL LOW (ref 6.5–8.1)

## 2016-09-01 LAB — RAPID URINE DRUG SCREEN, HOSP PERFORMED
Amphetamines: POSITIVE — AB
Barbiturates: NOT DETECTED
Benzodiazepines: POSITIVE — AB
COCAINE: NOT DETECTED
OPIATES: POSITIVE — AB
TETRAHYDROCANNABINOL: NOT DETECTED

## 2016-09-01 LAB — CBC WITH DIFFERENTIAL/PLATELET
BASOS PCT: 0 %
Basophils Absolute: 0 10*3/uL (ref 0.0–0.1)
EOS ABS: 0.1 10*3/uL (ref 0.0–0.7)
EOS PCT: 1 %
HCT: 38.3 % (ref 36.0–46.0)
Hemoglobin: 13.7 g/dL (ref 12.0–15.0)
LYMPHS ABS: 2.9 10*3/uL (ref 0.7–4.0)
Lymphocytes Relative: 41 %
MCH: 30.7 pg (ref 26.0–34.0)
MCHC: 35.8 g/dL (ref 30.0–36.0)
MCV: 85.9 fL (ref 78.0–100.0)
MONOS PCT: 16 %
Monocytes Absolute: 1.1 10*3/uL — ABNORMAL HIGH (ref 0.1–1.0)
Neutro Abs: 3.1 10*3/uL (ref 1.7–7.7)
Neutrophils Relative %: 42 %
PLATELETS: 133 10*3/uL — AB (ref 150–400)
RBC: 4.46 MIL/uL (ref 3.87–5.11)
RDW: 12.6 % (ref 11.5–15.5)
WBC: 7.1 10*3/uL (ref 4.0–10.5)

## 2016-09-01 LAB — ETHANOL

## 2016-09-01 LAB — SALICYLATE LEVEL

## 2016-09-01 LAB — ACETAMINOPHEN LEVEL: Acetaminophen (Tylenol), Serum: <10 ug/mL — ABNORMAL LOW (ref 10–30)

## 2016-09-01 LAB — TSH: TSH: 0.784 u[IU]/mL (ref 0.350–4.500)

## 2016-09-01 MED ORDER — ATORVASTATIN CALCIUM 20 MG PO TABS
20.0000 mg | ORAL_TABLET | Freq: Every day | ORAL | Status: DC
  Administered 2016-09-01 – 2016-09-05 (×5): 20 mg via ORAL
  Filled 2016-09-01 (×5): qty 1

## 2016-09-01 MED ORDER — IBUPROFEN 200 MG PO TABS: 600.0000 mg | ORAL_TABLET | Freq: Three times a day (TID) | ORAL | Status: DC | PRN

## 2016-09-01 MED ORDER — ACETAMINOPHEN 325 MG PO TABS: 650.0000 mg | ORAL_TABLET | ORAL | Status: DC | PRN

## 2016-09-01 MED ORDER — PANTOPRAZOLE SODIUM 40 MG PO TBEC
40.0000 mg | DELAYED_RELEASE_TABLET | Freq: Every day | ORAL | Status: DC
  Administered 2016-09-01 – 2016-09-05 (×5): 40 mg via ORAL
  Filled 2016-09-01 (×5): qty 1

## 2016-09-01 MED ORDER — RIVAROXABAN 20 MG PO TABS
20.0000 mg | ORAL_TABLET | Freq: Every day | ORAL | Status: DC
  Administered 2016-09-01 – 2016-09-05 (×5): 20 mg via ORAL
  Filled 2016-09-01 (×5): qty 1

## 2016-09-01 MED ORDER — NICOTINE 21 MG/24HR TD PT24: 21.0000 mg | MEDICATED_PATCH | Freq: Every day | TRANSDERMAL | Status: DC | PRN

## 2016-09-01 MED ORDER — SODIUM CHLORIDE 0.9 % IV BOLUS (SEPSIS)
1000.0000 mL | Freq: Once | INTRAVENOUS | Status: AC
  Administered 2016-09-01: 1000 mL via INTRAVENOUS

## 2016-09-01 MED ORDER — ZOLPIDEM TARTRATE 5 MG PO TABS: 5.0000 mg | ORAL_TABLET | Freq: Every evening | ORAL | Status: DC | PRN

## 2016-09-01 MED ORDER — ALUM & MAG HYDROXIDE-SIMETH 200-200-20 MG/5ML PO SUSP: 30.0000 mL | ORAL | Status: DC | PRN

## 2016-09-01 MED ORDER — OLANZAPINE 10 MG PO TABS
20.0000 mg | ORAL_TABLET | Freq: Every day | ORAL | Status: DC
  Administered 2016-09-01: 20 mg via ORAL
  Filled 2016-09-01: qty 2

## 2016-09-01 MED ORDER — ONDANSETRON HCL 4 MG PO TABS: 4.0000 mg | ORAL_TABLET | Freq: Three times a day (TID) | ORAL | Status: DC | PRN

## 2016-09-01 MED ORDER — FLECAINIDE ACETATE 100 MG PO TABS
100.0000 mg | ORAL_TABLET | Freq: Two times a day (BID) | ORAL | Status: DC
  Administered 2016-09-01 – 2016-09-05 (×9): 100 mg via ORAL
  Filled 2016-09-01 (×10): qty 1

## 2016-09-01 NOTE — ED Notes (Signed)
Patient's spouse requesting to be updated after re-evaluation. Phone number located in chart under demographics.

## 2016-09-01 NOTE — ED Triage Notes (Signed)
Patient arrived to department via EMS. According to EMS she took 9 Trazadone 100mg   (filled 07/08/16 with quantity of 90. She is prescribed 2 tablets qhs). She also ingested an unknown amount of Lorazapem. The Lorazepam expired in 2016. She does have suicidal intentions due to her son being missing and depression per EMS. EMS denies any acts of violence. She had an psychiatric appointment this week but did not attend. Easily aroused however will fall asleep easily. Responds to name. Alert and oriented x4.  Denies any headache, delusions, neuro intact. #20 in Left hand, CBG 127, 67 HR irregular, 115/73, 12 resp, 96% on room air. Has history of afibb. Allergic to amoxicillin. Hx of distonia, atrial fib, HTN, and depression. Husband is on the way and will have all prescribed medications.

## 2016-09-01 NOTE — ED Notes (Signed)
[facility] with [facility]. Po[facility] Control closed patient's case at this time.

## 2016-09-01 NOTE — ED Notes (Signed)
Patient states she "I took pills because I wanted to I am depressed and I don't know were my son is." Unable to express what pills and what time they were taken. Continue stating "I prefer not to talk about it right now." She is A&O x4 however, is drowsy but arousable to touch and when her name is called. Awaiting her husbands arrival.
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
## 2016-09-01 NOTE — ED Notes (Signed)
Patient transported to CT 

## 2016-09-01 NOTE — ED Notes (Signed)
Pt's husband at bedside. Spoke to OdinLord, NP. Per husband, pt's speech is normal at baseline, she has no cognitive decline, manages her own finances, and takes son to activities 3x/week. Husband feels patient's current state is very different than baseline.

## 2016-09-01 NOTE — ED Notes (Signed)
Bed: RESB Expected date:  Expected time:  Means of arrival:  Comments: EMS 65 yo female overdose trazadone and lorazepam/SI

## 2016-09-01 NOTE — ED Notes (Signed)
ED Provider at bedside. 

## 2016-09-01 NOTE — ED Notes (Signed)
Bed: WA15 Expected date:  Expected time:  Means of arrival:  Comments: ResB 

## 2016-09-01 NOTE — ED Notes (Signed)
Bed: [id number] Expected date:  Expected time:  Means of arrival:  Comments: RM 15

## 2016-09-01 NOTE — ED Provider Notes (Signed)
[facility]-EMERGENCY DEPT Provider Note   CSN: [id number] Arrival date & time: 09/01/16  0034 By signing my name below, I, Levon HedgerElizabeth Hall, attest that this documentation has been prepared under the direction and in the presence of Shon Batonourtney F Cassandr Cederberg, MD . Electronically Signed: Levon HedgerElizabeth Hall, Scribe. 09/01/2016. 12:56 AM.   History   Chief Complaint Chief Complaint  Patient presents with  . Drug Overdose  . Suicidal   Level V Caveat: Pt somnolence   HPI Danielle Rocha is a 65 y.o. female with a hx of depression who presents to the Emergency Department s/p drug overdose at an unkown time PTA. Pt states she overdosed intentionally. Per nursing staff, she took 9 trazodone 100 mg and an unknown quantity of Lorazepam. Per nursing note: The Lorazepam expired in 2016. She does have suicidal intentions due to her son being missing and depression per EMS. EMS denies any acts of violence. She had an psychiatric appointment this week but did not attend. Pt complains of left knee pain.   Patient unable to provide detailed history of how much medication was taken or when. Husband is on his way.  The history is provided by the patient and the EMS personnel. The history is limited by the condition of the patient. No language interpreter was used.    Past Medical History:  Diagnosis Date  . Arthritis    DJD, low back, thumb  . Atrial fibrillation (HCC)    Xarelto anticoagulation. Flecainide antiarrythmic   . Chicken pox   . Chronic pain syndrome    On disability. History of bilateral hip pain, low back pain. Gabapentin 400 BID, percocet 1 tablet daily per prior provider.   . Colon polyp    awaiting records  . Depression    zoloft 100mg , remeron 30mg  per psychiatry. ambien 10mg  per psychiatry to help wtih sleep element.   . Dystonia    described as psychogenic dystonia. Pain and twisting from upper chest and up ith triggers "wind, creamy food" on TID ativan per psychiatry previously.   .  Fibromyalgia   . GERD (gastroesophageal reflux disease)    omeprazole OTC  . Goiter    states multiple imaging tests, has had biopsies  . Hyperlipidemia    lovastatin 20mg   . Stroke [facility](HCC)    TIA (left side of face and body decreased sensitivity than right face and side)  . TIA (transient ischemic attack)   . Vaginal atrophy    estrace vaginal cream    Patient Active Problem List   Diagnosis Date Noted  . GERD (gastroesophageal reflux disease) 01/30/2016  . Suicidal overdose (HCC) 11/22/2015  . Atrial fibrillation (HCC)   . Chronic pain syndrome   . Depression     Past Surgical History:  Procedure Laterality Date  . ATRIAL FIBRILLATION ABLATION    . BIOPSY THYROID    . fusion l4-l5  2000  . LAMINECTOMY     L4-L5  . OTHER SURGICAL HISTORY     tennis elbow surgery  . piriformis release  1999  . TONSILLECTOMY AND ADENOIDECTOMY  age 89  . VAGINAL HYSTERECTOMY  1993    OB History    Gravida Para Term Preterm AB Living   3 3 2 1   3    SAB TAB Ectopic Multiple Live Births           3       Home Medications    Prior to Admission medications   Medication Sig Start Date End Date Taking? Authorizing  Provider  acetaminophen (TYLENOL) 500 MG tablet Take 1,000 mg by mouth every 6 (six) hours as needed for mild pain, moderate pain, fever or headache.    Historical Provider, MD  atorvastatin (LIPITOR) 20 MG tablet Take 1 tablet (20 mg total) by mouth daily. 05/27/16   Shelva MajesticStephen O Hunter, MD  divalproex (DEPAKOTE) 500 MG DR tablet Take 1,000 mg by mouth at bedtime.     Historical Provider, MD  flecainide (TAMBOCOR) 100 MG tablet Take 1 tablet (100 mg total) by mouth 2 (two) times daily. 11/27/15   Adonis BrookSheila Agustin, NP  gabapentin (NEURONTIN) 300 MG capsule Take 300 mg by mouth daily with breakfast. 04/17/16   Historical Provider, MD  HYDROcodone-acetaminophen (NORCO/VICODIN) 5-325 MG tablet Take 0.5-1 tablets by mouth 2 (two) times daily as needed for moderate pain or severe pain (may fill  today. to last 1 month.). 05/28/16   Shelva MajesticStephen O Hunter, MD  HYDROcodone-acetaminophen (NORCO/VICODIN) 5-325 MG tablet Take 0.5-1 tablets by mouth 2 (two) times daily as needed for moderate pain or severe pain (may fill in 1 month). 05/28/16   Shelva MajesticStephen O Hunter, MD  HYDROcodone-acetaminophen (NORCO/VICODIN) 5-325 MG tablet Take 0.5-1 tablets by mouth 2 (two) times daily as needed for moderate pain or severe pain (may fill in 2 months). 05/28/16   Shelva MajesticStephen O Hunter, MD  Melatonin 3 MG TABS Take 2 tablets by mouth at bedtime.     Historical Provider, MD  Multiple Vitamin (MULTIVITAMIN WITH MINERALS) TABS tablet Take 1 tablet by mouth daily.    Historical Provider, MD  OLANZapine (ZYPREXA) 20 MG tablet Take 20 mg by mouth at bedtime.    Historical Provider, MD  omeprazole (PRILOSEC) 20 MG capsule Take 1 capsule (20 mg total) by mouth 2 (two) times daily before a meal. Patient taking differently: Take 40 mg by mouth daily with breakfast.  01/30/16 04/23/16  Shelva MajesticStephen O Hunter, MD  rivaroxaban (XARELTO) 20 MG TABS tablet Take 1 tablet (20 mg total) by mouth daily with supper. 11/27/15   Adonis BrookSheila Agustin, NP  traZODone (DESYREL) 100 MG tablet Take 200 mg by mouth at bedtime. 03/21/16   Historical Provider, MD    Family History Family History  Problem Relation Age of Onset  . Alcohol abuse Mother   . Hypertension Father   . Alcohol abuse Father   . Pancreatic cancer Father   . Cancer Brother     spindle cell RLE  . Cancer Brother     tonsil cancer  . Skin cancer Brother     Social History Social History  Substance Use Topics  . Smoking status: Never Smoker  . Smokeless tobacco: Never Used  . Alcohol use No     Allergies   Fish allergy; Reglan [metoclopramide]; Ciprofloxacin; Clindamycin/lincomycin; Vibramycin [doxycycline]; Macrobid [nitrofurantoin monohyd macro]; and Sulfa antibiotics   Review of Systems Review of Systems  Unable to perform ROS: Mental status change   Physical Exam Updated Vital  Signs BP 125/64   Pulse 73   Temp 97.5 F (36.4 C) (Oral)   Resp 17   SpO2 97%   Physical Exam  Constitutional: She is oriented to person, place, and time. No distress.  Somnolent but arousable to voice, falls asleep frequently during questioning, ABCs intact  HENT:  Head: Normocephalic and atraumatic.  Eyes: Pupils are equal, round, and reactive to light.  Pupils 5 mm reactive bilaterally  Cardiovascular: Normal rate, regular rhythm and normal heart sounds.   No murmur heard. Pulmonary/Chest: Effort normal and breath sounds  normal. No respiratory distress. She has no wheezes.  Abdominal: Soft. Bowel sounds are normal. There is no tenderness. There is no guarding.  Neurological: She is oriented to person, place, and time.  Somnolent, arousable to voice, oriented, moves all 4 extremities and follows commands  Skin: Skin is warm and dry.  Psychiatric:  Flat affect, depressed  Nursing note and vitals reviewed.   ED Treatments / Results  DIAGNOSTIC STUDIES:  Oxygen Saturation is 98% on RA, normal by my interpretation.    COORDINATION OF CARE:  12:55 AM Discussed treatment plan with pt at bedside and pt agreed to plan.  Labs (all labs ordered are listed, but only abnormal results are displayed) Labs Reviewed  CBC WITH DIFFERENTIAL/PLATELET - Abnormal; Notable for the following:       Result Value   Platelets 133 (*)    Monocytes Absolute 1.1 (*)    All other components within normal limits  COMPREHENSIVE METABOLIC PANEL - Abnormal; Notable for the following:    Potassium 3.4 (*)    Glucose, Bld 105 (*)    BUN 21 (*)    Calcium 8.8 (*)    Total Protein 6.4 (*)    All other components within normal limits  ACETAMINOPHEN LEVEL - Abnormal; Notable for the following:    Acetaminophen (Tylenol), Serum <10 (*)    All other components within normal limits  SALICYLATE LEVEL  ETHANOL  RAPID URINE DRUG SCREEN, HOSP PERFORMED    EKG  EKG Interpretation  Date/Time:  Monday  September 01 2016 00:39:27 EST Ventricular Rate:  67 PR Interval:    QRS Duration: 112 QT Interval:  486 QTC Calculation: 514 R Axis:   24 Text Interpretation:  Sinus rhythm Borderline intraventricular conduction delay Prolonged QT interval Confirmed by Wilkie Aye  MD, Toni Amend ([id number]) on 09/01/2016 1:22:42 AM       Radiology No results found.  Procedures Procedures (including critical care time)  Medications Ordered in ED Medications  sodium chloride 0.9 % bolus 1,000 mL (not administered)     Initial Impression / Assessment and Plan / ED Course  I have reviewed the triage vital signs and the nursing notes.  Pertinent labs & imaging results that were available during my care of the patient were reviewed by me and considered in my medical decision making (see chart for details).  Clinical Course as of Sep 01 624  Mon Sep 01, 2016  6045 Patient arousable. Does not engage in conversation. She remained somewhat somnolent. No complaints at this time. Will order the patient breakfast and allow to metabolize for one to 2 more hours. If at that time she is at baseline, TTS states to be consult.  [CH]    Clinical Course User Index [CH] Shon Baton, MD   Patient presents after intentional ingestion of trazodone and Ativan. ABCs are intact. She is very somnolent. Monitoring per poison control. She will require at least 6 hours of monitoring. Her other lab work is reassuring.  Final Clinical Impressions(s) / ED Diagnoses   Final diagnoses:  Intentional drug overdose, initial encounter [facility])    New Prescriptions New Prescriptions   No medications on file   I personally performed the services described in this documentation, which was scribed in my presence. The recorded information has been reviewed and is accurate.    Shon Baton, MD 09/01/16 [id number]

## 2016-09-01 NOTE — ED Provider Notes (Signed)
Reassessment, post initial evaluation, with observation now for 10 hours. She continues to be difficult to assess and not speaking clearly. Her answers seem deliberate and avoidant at times. When answering, she is not dysarthric. I will have TTS evaluate the patient at this time.  Medications  acetaminophen (TYLENOL) tablet 650 mg (not administered)  ibuprofen (ADVIL,MOTRIN) tablet 600 mg (not administered)  zolpidem (AMBIEN) tablet 5 mg (not administered)  nicotine (NICODERM CQ - dosed in mg/24 hours) patch 21 mg (not administered)  ondansetron (ZOFRAN) tablet 4 mg (not administered)  alum & mag hydroxide-simeth (MAALOX/MYLANTA) 200-200-20 MG/5ML suspension 30 mL (not administered)  atorvastatin (LIPITOR) tablet 20 mg (not administered)  flecainide (TAMBOCOR) tablet 100 mg (not administered)  OLANZapine (ZYPREXA) tablet 20 mg (not administered)  pantoprazole (PROTONIX) EC tablet 40 mg (not administered)  rivaroxaban (XARELTO) tablet 20 mg (not administered)  sodium chloride 0.9 % bolus 1,000 mL (0 mLs Intravenous Stopped 09/01/16 0803)    Patient Vitals for the past 24 hrs:  BP Temp Temp src Pulse Resp SpO2  09/01/16 1100 110/56 - - - - -  09/01/16 1030 101/61 - - 69 16 98 %  09/01/16 1000 114/76 - - 68 16 98 %  09/01/16 0930 106/69 - - 67 14 95 %  09/01/16 0908 103/72 - - 72 14 100 %  09/01/16 0830 (!) 108/51 - - 65 13 96 %  09/01/16 0800 129/65 - - 70 17 97 %  09/01/16 0730 118/65 - - 71 19 97 %  09/01/16 0715 122/61 - - 68 12 97 %  09/01/16 0700 112/64 - - 69 13 96 %  09/01/16 0645 116/63 - - 67 14 97 %  09/01/16 0630 103/70 - - 69 16 98 %  09/01/16 0615 110/78 - - 64 14 96 %  09/01/16 0600 125/64 - - 73 17 97 %  09/01/16 0547 104/64 - - 71 14 97 %  09/01/16 0530 (!) 124/108 - - 68 17 97 %  09/01/16 0515 109/66 - - 73 14 97 %  09/01/16 0445 103/56 - - 68 15 94 %  09/01/16 0430 119/72 - - 62 15 93 %  09/01/16 0415 (!) 104/53 - - 66 21 93 %  09/01/16 0400 (!) 108/54 - - (!)  56 17 95 %  09/01/16 0345 (!) 106/53 - - (!) 56 17 96 %  09/01/16 0330 103/62 - - (!) 56 18 95 %  09/01/16 0315 (!) 109/52 - - (!) 55 11 96 %  09/01/16 0300 111/76 - - (!) 56 11 97 %  09/01/16 0245 (!) 116/51 - - 60 14 100 %  09/01/16 0230 (!) 106/49 - - 63 14 97 %  09/01/16 0215 109/68 - - 64 16 97 %  09/01/16 0145 134/76 - - 73 13 98 %  09/01/16 0130 125/70 - - 65 14 98 %  09/01/16 0115 110/57 - - 63 12 98 %  09/01/16 0100 (!) 99/50 - - 68 18 99 %  09/01/16 0045 (!) 125/50 - - 65 15 97 %  09/01/16 0041 136/82 97.5 F (36.4 C) Oral 75 16 98 %       Mancel BaleElliott Roselee Tayloe, MD 09/01/16 1111

## 2016-09-01 NOTE — BH Assessment (Signed)
BHH Assessment Progress Note  Per [name], DNP, this pt requires psychiatric hospitalization in a geriatric specialty program at this time.  The following facilities have been contacted to seek placement for this pt, with results as noted:  Beds available, information sent, decision pending:  [facility] [place]   At capacity:  [facility]   Doylene Canninghomas Myriah Boggus, [place] Triage Specialist [phone]

## 2016-09-01 NOTE — ED Notes (Signed)
Patient changed into purple scrubs/yellow socks. Patient's belongings taken to TCU with patient. Patient re-wanded by security prior to transport.

## 2016-09-01 NOTE — ED Notes (Signed)
Patient denies pain and is resting comfortably.  

## 2016-09-01 NOTE — ED Notes (Signed)
Report given to Marchelle FolksAmanda, [facility].  Called [employer].

## 2016-09-02 DIAGNOSIS — Z9889 Other specified postprocedural states: Secondary | ICD-10-CM

## 2016-09-02 DIAGNOSIS — R45851 Suicidal ideations: Secondary | ICD-10-CM

## 2016-09-02 DIAGNOSIS — Z811 Family history of alcohol abuse and dependence: Secondary | ICD-10-CM

## 2016-09-02 DIAGNOSIS — F332 Major depressive disorder, recurrent severe without psychotic features: Secondary | ICD-10-CM | POA: Diagnosis not present

## 2016-09-02 DIAGNOSIS — T50902A Poisoning by unspecified drugs, medicaments and biological substances, intentional self-harm, initial encounter: Secondary | ICD-10-CM

## 2016-09-02 DIAGNOSIS — Z888 Allergy status to other drugs, medicaments and biological substances status: Secondary | ICD-10-CM

## 2016-09-02 DIAGNOSIS — T1491XA Suicide attempt, initial encounter: Secondary | ICD-10-CM | POA: Diagnosis not present

## 2016-09-02 DIAGNOSIS — Z79899 Other long term (current) drug therapy: Secondary | ICD-10-CM

## 2016-09-02 DIAGNOSIS — F325 Major depressive disorder, single episode, in full remission: Secondary | ICD-10-CM | POA: Diagnosis present

## 2016-09-02 DIAGNOSIS — Z882 Allergy status to sulfonamides status: Secondary | ICD-10-CM

## 2016-09-02 DIAGNOSIS — Z8249 Family history of ischemic heart disease and other diseases of the circulatory system: Secondary | ICD-10-CM

## 2016-09-02 DIAGNOSIS — Z91013 Allergy to seafood: Secondary | ICD-10-CM | POA: Diagnosis not present

## 2016-09-02 DIAGNOSIS — Z808 Family history of malignant neoplasm of other organs or systems: Secondary | ICD-10-CM

## 2016-09-02 MED ORDER — LORAZEPAM 0.5 MG PO TABS
0.5000 mg | ORAL_TABLET | Freq: Two times a day (BID) | ORAL | Status: DC
  Administered 2016-09-02 – 2016-09-05 (×6): 0.5 mg via ORAL
  Filled 2016-09-02 (×6): qty 1

## 2016-09-02 MED ORDER — TRAZODONE HCL 50 MG PO TABS
50.0000 mg | ORAL_TABLET | Freq: Every evening | ORAL | Status: DC | PRN
  Administered 2016-09-03: 50 mg via ORAL
  Filled 2016-09-02: qty 1

## 2016-09-02 MED ORDER — OLANZAPINE 2.5 MG PO TABS: 7.5000 mg | ORAL_TABLET | Freq: Every day | ORAL | Status: DC

## 2016-09-02 MED ORDER — OLANZAPINE 2.5 MG PO TABS
7.5000 mg | ORAL_TABLET | Freq: Every day | ORAL | Status: DC
  Administered 2016-09-02 – 2016-09-04 (×3): 7.5 mg via ORAL
  Filled 2016-09-02 (×3): qty 1

## 2016-09-02 NOTE — Consult Note (Addendum)
[place] Psychiatry Consult   Reason for Consult:  Intentional overdose Referring Physician:  EDP Patient Identification: Danielle Rocha MRN:  [id number] Principal Diagnosis: Major depressive disorder, recurrent severe without psychotic features [facility]) Diagnosis:   Patient Active Problem List   Diagnosis Date Noted  . Major depressive disorder, recurrent severe without psychotic features ([place]) [F33.2] 09/02/2016    Priority: High  . Suicidal overdose ([place]) [T50.902A] 11/22/2015    Priority: High  . GERD (gastroesophageal reflux disease) [K21.9] 01/30/2016  . Atrial fibrillation ([place]) [I48.91]   . Chronic pain syndrome [G89.4]     Total Time spent with patient: 45 minutes  Subjective:   Danielle Rocha is a 65 y.o. female patient admitted with suicide attempt.  HPI:  65 yo female who presented to the ED after her husband found her with empty medication bottles, intentional overdose.  She has been cleared medically and is alert today.  Danielle Rocha gave verbal permission for her husband to be involved with her medical and psychiatric care, husband at her bedside and provides collateral information.  She has been stressed with one son being missing for the past four months due to drug use and the death of their dad which was also recent.  Danielle Rocha is also the primary caregiver to her adult son with Down's Syndrome and is usually highly functioning.  Now, she is slow to talk and move.  She does comprehend the conversation and is agreeable to treatment.  No homicidal ideations, hallucinations, or alcohol/drug abuse.  She reports being on Ativan for dystonia.    Past Psychiatric History: depression  Risk to Self: Suicidal Ideation: Yes-Currently Present (pt unable to confirm or deny; per reports pt overdosed) Suicidal Intent: Yes-Currently Present Is patient at risk for suicide?: Yes Suicidal Plan?: Yes-Currently Present Specify Current Suicidal Plan:  (pt overdosed prior to  arrival ) Access to Means: Yes Specify Access to Suicidal Means:  (Trazadone and Larazapam) What has been your use of drugs/alcohol within the last 12 months?:  (unable to confirm or deny) How many times?:  (per hx pt attempted 11/2015 by overdose; unsure or addit. hx) Other Self Harm Risks:  (unk) Triggers for Past Attempts: Other (Comment) (unk; pt unable to provide details ) Intentional Self Injurious Behavior:  (unk) Risk to Others: Homicidal Ideation:  (unable to confirm or deny ) Thoughts of Harm to Others:  (unable to confirm or deny ) Current Homicidal Intent:  (unable to confirm or deny ) Current Homicidal Plan:  (unable to confirm or deny) Access to Homicidal Means:  (unable to confirm or deny  ) Identified Victim:  (unk ) History of harm to others?:  (unable to confirm or deny ) Assessment of Violence:  (unk ) Violent Behavior Description:  (patient is calm but drowsy; unable to answer questions appro) Does patient have access to weapons?:  (unk) Criminal Charges Pending?:  (unk ) Does patient have a court date:  (unk) Prior Inpatient Therapy: Prior Inpatient Therapy: Yes Prior Therapy Dates:  [facility] 11/2015 per history ) Prior Therapy Facilty/Provider(s):  [facility] per history o) Reason for Treatment:  (suicide attempt by overdose per history ) Prior Outpatient Therapy: Prior Outpatient Therapy: Yes Prior Therapy Dates:  (curren t) Prior Therapy Facilty/Provider(s):  [facility] Reason for Treatment:  (outpatient therapy and medication managment; depression) Does patient have an ACCT team?: No Does patient have Intensive In-House Services?  : No Does patient have [facility] services? : No Does patient have  P4CC services?: No  Past Medical History:  Past Medical History:  Diagnosis Date  . Arthritis    DJD, low back, thumb  . Atrial fibrillation (HCC)    Xarelto anticoagulation. Flecainide antiarrythmic   . Chicken pox   .  Chronic pain syndrome    On disability. History of bilateral hip pain, low back pain. Gabapentin 400 BID, percocet 1 tablet daily per prior provider.   . Colon polyp    awaiting records  . Depression    zoloft 141m, remeron 391mper psychiatry. ambien 1051mer psychiatry to help wtih sleep element.   . Dystonia    described as psychogenic dystonia. Pain and twisting from upper chest and up ith triggers "wind, creamy food" on TID ativan per psychiatry previously.   . Fibromyalgia   . GERD (gastroesophageal reflux disease)    omeprazole OTC  . Goiter    states multiple imaging tests, has had biopsies  . Hyperlipidemia    [facility] (left side of face and body decreased sensitivity than right face and side)  . TIA (transient ischemic attack)   . Vaginal atrophy    estrace vaginal cream    Past Surgical History:  Procedure Laterality Date  . ATRIAL FIBRILLATION ABLATION    . BIOPSY THYROID    . fusion l4-l5  2000  . LAMINECTOMY     L4-L5  . OTHER SURGICAL HISTORY     tennis elbow surgery  . piriformis release  1999  . TONSILLECTOMY AND ADENOIDECTOMY  age 15  .25VAGINAL HYSTERECTOMY  1993   Family History:  Family History  Problem Relation Age of Onset  . Alcohol abuse Mother   . Hypertension Father   . Alcohol abuse Father   . Pancreatic cancer Father   . Cancer Brother     spindle cell RLE  . Cancer Brother     tonsil cancer  . Skin cancer Brother    Family Psychiatric  History: son with substance abuse Social History:  History  Alcohol Use No     History  Drug Use No    Social History   Social History  . Marital status: Married    Spouse name: N/A  . Number of children: N/A  . Years of education: N/A   Social History Main Topics  . Smoking status: Never Smoker  . Smokeless tobacco: Never Used  . Alcohol use No  . Drug use: No  . Sexual activity: No   Other Topics Concern  . None   Social History Narrative   Family:  Married. Husband works as drivGeophysicist/field seismologist walmBrazoriachildren from previous marriage 1 from current. Son stepAnnie Maines with them and has down's syndrome.       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       Hobbies: time with son   Additional Social History:    Allergies:   Allergies  Allergen Reactions  . Fish Allergy Rash    Shell Fish   . Reglan [Metoclopramide] Other (See Comments)    Suicidal thoughts  . Ciprofloxacin Diarrhea  . Clindamycin/Lincomycin Diarrhea  . Vibramycin [Doxycycline] Diarrhea  . Macrobid [Nitrofurantoin Monohyd Macro] Rash    Rash on lip  . Sulfa Antibiotics Rash    Labs:  Results for orders placed or performed during the hospital encounter of 09/01/16 (  from the past 48 hour(s))  CBC with Differential     Status: Abnormal   Collection Time: 09/01/16 12:59 AM  Result Value Ref Range   WBC 7.1 4.0 - 10.5 K/uL   RBC 4.46 3.87 - 5.11 MIL/uL   Hemoglobin 13.7 12.0 - 15.0 g/dL   HCT 38.3 36.0 - 46.0 %   MCV 85.9 78.0 - 100.0 fL   MCH 30.7 26.0 - 34.0 pg   MCHC 35.8 30.0 - 36.0 g/dL   RDW 12.6 11.5 - 15.5 %   Platelets 133 (L) 150 - 400 K/uL   Neutrophils Relative % 42 %   Neutro Abs 3.1 1.7 - 7.7 K/uL   Lymphocytes Relative 41 %   Lymphs Abs 2.9 0.7 - 4.0 K/uL   Monocytes Relative 16 %   Monocytes Absolute 1.1 (H) 0.1 - 1.0 K/uL   Eosinophils Relative 1 %   Eosinophils Absolute 0.1 0.0 - 0.7 K/uL   Basophils Relative 0 %   Basophils Absolute 0.0 0.0 - 0.1 K/uL  Comprehensive metabolic panel     Status: Abnormal   Collection Time: 09/01/16 12:59 AM  Result Value Ref Range   Sodium 141 135 - 145 mmol/L   Potassium 3.4 (L) 3.5 - 5.1 mmol/L   Chloride 107 101 - 111 mmol/L   CO2 26 22 - 32 mmol/L   Glucose, Bld 105 (H) 65 - 99 mg/dL   BUN 21 (H) 6 - 20 mg/dL   Creatinine, Ser 0.72 0.44 - 1.00 mg/dL   Calcium 8.8 (L) 8.9 -  10.3 mg/dL   Total Protein 6.4 (L) 6.5 - 8.1 g/dL   Albumin 3.9 3.5 - 5.0 g/dL   AST 22 15 - 41 U/L   ALT 15 14 - 54 U/L   Alkaline Phosphatase 45 38 - 126 U/L   Total Bilirubin 0.7 0.3 - 1.2 mg/dL   GFR calc non Af Amer >60 >60 mL/min   GFR calc Af Amer >60 >60 mL/min    Comment: (NOTE) The eGFR has been calculated using the CKD EPI equation. This calculation has not been validated in all clinical situations. eGFR's persistently <60 mL/min signify possible Chronic Kidney Disease.    Anion gap 8 5 - 15  Acetaminophen level     Status: Abnormal   Collection Time: 09/01/16 12:59 AM  Result Value Ref Range   Acetaminophen (Tylenol), Serum <10 (L) 10 - 30 ug/mL    Comment:        THERAPEUTIC CONCENTRATIONS VARY SIGNIFICANTLY. A RANGE OF 10-30 ug/mL MAY BE AN EFFECTIVE CONCENTRATION FOR MANY PATIENTS. HOWEVER, SOME ARE BEST TREATED AT CONCENTRATIONS OUTSIDE THIS RANGE. ACETAMINOPHEN CONCENTRATIONS >150 ug/mL AT 4 HOURS AFTER INGESTION AND >50 ug/mL AT 12 HOURS AFTER INGESTION ARE OFTEN ASSOCIATED WITH TOXIC REACTIONS.   Salicylate level     Status: None   Collection Time: 09/01/16 12:59 AM  Result Value Ref Range   Salicylate Lvl <3.6 2.8 - 30.0 mg/dL  Ethanol     Status: None   Collection Time: 09/01/16 12:59 AM  Result Value Ref Range   Alcohol, Ethyl (B) <5 <5 mg/dL    Comment:        LOWEST DETECTABLE LIMIT FOR SERUM ALCOHOL IS 5 mg/dL FOR MEDICAL PURPOSES ONLY   TSH     Status: None   Collection Time: 09/01/16  3:56 PM  Result Value Ref Range   TSH 0.784 0.350 - 4.500 uIU/mL    Comment: Performed by a 3rd Generation assay  with a functional sensitivity of <=0.01 uIU/mL.  Rapid urine drug screen (hospital performed)     Status: Abnormal   Collection Time: 09/01/16  5:13 PM  Result Value Ref Range   Opiates POSITIVE (A) NONE DETECTED   Cocaine NONE DETECTED NONE DETECTED   Benzodiazepines POSITIVE (A) NONE DETECTED   Amphetamines POSITIVE (A) NONE DETECTED    Tetrahydrocannabinol NONE DETECTED NONE DETECTED   Barbiturates NONE DETECTED NONE DETECTED    Comment:        DRUG SCREEN FOR MEDICAL PURPOSES ONLY.  IF CONFIRMATION IS NEEDED FOR ANY PURPOSE, NOTIFY LAB WITHIN 5 DAYS.        LOWEST DETECTABLE LIMITS FOR URINE DRUG SCREEN Drug Class       Cutoff (ng/mL) Amphetamine      1000 Barbiturate      200 Benzodiazepine   154 Tricyclics       008 Opiates          300 Cocaine          300 THC              50   Urinalysis, Complete w Microscopic     Status: Abnormal   Collection Time: 09/01/16  5:14 PM  Result Value Ref Range   Color, Urine YELLOW YELLOW   APPearance HAZY (A) CLEAR   Specific Gravity, Urine 1.021 1.005 - 1.030   pH 6.0 5.0 - 8.0   Glucose, UA NEGATIVE NEGATIVE mg/dL   Hgb urine dipstick SMALL (A) NEGATIVE   Bilirubin Urine NEGATIVE NEGATIVE   Ketones, ur 5 (A) NEGATIVE mg/dL   Protein, ur 30 (A) NEGATIVE mg/dL   Nitrite NEGATIVE NEGATIVE   Leukocytes, UA NEGATIVE NEGATIVE   RBC / HPF 6-30 0 - 5 RBC/hpf   WBC, UA 6-30 0 - 5 WBC/hpf   Bacteria, UA FEW (A) NONE SEEN   Squamous Epithelial / LPF NONE SEEN NONE SEEN   Mucous PRESENT     Current Facility-Administered Medications  Medication Dose Route Frequency Provider Last Rate Last Dose  . acetaminophen (TYLENOL) tablet 650 mg  650 mg Oral Q4H PRN Daleen Bo, MD      . alum & mag hydroxide-simeth (MAALOX/MYLANTA) 200-200-20 MG/5ML suspension 30 mL  30 mL Oral PRN Daleen Bo, MD      . atorvastatin (LIPITOR) tablet 20 mg  20 mg Oral Daily Daleen Bo, MD   20 mg at 09/01/16 1150  . flecainide (TAMBOCOR) tablet 100 mg  100 mg Oral BID Daleen Bo, MD   100 mg at 09/02/16 6761  . ibuprofen (ADVIL,MOTRIN) tablet 600 mg  600 mg Oral Q8H PRN Daleen Bo, MD      . LORazepam (ATIVAN) tablet 0.5 mg  0.5 mg Oral BID Desmond Szabo, MD      . nicotine (NICODERM CQ - dosed in mg/24 hours) patch 21 mg  21 mg Transdermal Daily PRN Daleen Bo, MD      . OLANZapine  (ZYPREXA) tablet 7.5 mg  7.5 mg Oral QHS Latunya Kissick, MD      . ondansetron (ZOFRAN) tablet 4 mg  4 mg Oral Q8H PRN Daleen Bo, MD      . pantoprazole (PROTONIX) EC tablet 40 mg  40 mg Oral Daily Daleen Bo, MD   40 mg at 09/01/16 1150  . rivaroxaban (XARELTO) tablet 20 mg  20 mg Oral Q supper Daleen Bo, MD   20 mg at 09/01/16 1847  . traZODone (DESYREL) tablet 50 mg  50  mg Oral QHS PRN Corena Pilgrim, MD       Current Outpatient Prescriptions  Medication Sig Dispense Refill  . acetaminophen (TYLENOL) 500 MG tablet Take 1,000 mg by mouth every 6 (six) hours as needed for mild pain, moderate pain, fever or headache.    Marland Kitchen atorvastatin (LIPITOR) 20 MG tablet Take 1 tablet (20 mg total) by mouth daily. 30 tablet 5  . divalproex (DEPAKOTE) 500 MG DR tablet Take 500 mg by mouth 3 (three) times daily.     . flecainide (TAMBOCOR) 100 MG tablet Take 1 tablet (100 mg total) by mouth 2 (two) times daily. 60 tablet 0  . gabapentin (NEURONTIN) 300 MG capsule Take 300 mg by mouth 2 (two) times daily.     Marland Kitchen HYDROcodone-acetaminophen (NORCO/VICODIN) 5-325 MG tablet Take 0.5-1 tablets by mouth 2 (two) times daily as needed for moderate pain or severe pain (may fill today. to last 1 month.). 30 tablet 0  . OLANZapine (ZYPREXA) 20 MG tablet Take 20 mg by mouth at bedtime.    Marland Kitchen omeprazole (PRILOSEC) 20 MG capsule Take 1 capsule (20 mg total) by mouth 2 (two) times daily before a meal. (Patient taking differently: Take 40 mg by mouth daily with breakfast. ) 60 capsule 6  . rivaroxaban (XARELTO) 20 MG TABS tablet Take 1 tablet (20 mg total) by mouth daily with supper. 30 tablet 0  . traZODone (DESYREL) 100 MG tablet Take 200 mg by mouth at bedtime.    Marland Kitchen HYDROcodone-acetaminophen (NORCO/VICODIN) 5-325 MG tablet Take 0.5-1 tablets by mouth 2 (two) times daily as needed for moderate pain or severe pain (may fill in 1 month). (Patient not taking: Reported on 09/01/2016) 30 tablet 0  .  HYDROcodone-acetaminophen (NORCO/VICODIN) 5-325 MG tablet Take 0.5-1 tablets by mouth 2 (two) times daily as needed for moderate pain or severe pain (may fill in 2 months). (Patient not taking: Reported on 09/01/2016) 30 tablet 0  . Melatonin 3 MG TABS Take 2 tablets by mouth at bedtime.     . Multiple Vitamin (MULTIVITAMIN WITH MINERALS) TABS tablet Take 1 tablet by mouth daily.      Musculoskeletal: Strength & Muscle Tone: decreased Gait & Station: unsteady Patient leans: N/A  Psychiatric Specialty Exam: Physical Exam  Constitutional: She appears well-developed and well-nourished.  HENT:  Head: Normocephalic.  Respiratory: Effort normal.  Neurological: She is alert.  Psychiatric: Her speech is normal. Judgment normal. She is slowed. Cognition and memory are impaired. She exhibits a depressed mood. She expresses suicidal ideation. She expresses suicidal plans.    Review of Systems  Psychiatric/Behavioral: Positive for depression and suicidal ideas.  All other systems reviewed and are negative.   Blood pressure 107/58, pulse 70, temperature 99.1 F (37.3 C), temperature source Oral, resp. rate 16, SpO2 95 %.There is no height or weight on file to calculate BMI.  General Appearance: Casual  Eye Contact:  Fair  Speech:  Slow  Volume:  Decreased  Mood:  Depressed  Affect:  Congruent  Thought Process:  Coherent and Descriptions of Associations: Intact  Orientation:  Other:  person and place  Thought Content:  Logical and Rumination  Suicidal Thoughts:  Yes.  with intent/plan  Homicidal Thoughts:  No  Memory:  Immediate;   Fair Recent;   Fair Remote;   Fair  Judgement:  Poor  Insight:  Fair  Psychomotor Activity:  Decreased  Concentration:  Concentration: Fair and Attention Span: Fair  Recall:  [employer] of Knowledge:  Fair  Language:  Fair  Akathisia:  No  Handed:  Right  AIMS (if indicated):     Assets:  Communication Skills Desire for Improvement Financial  Resources/Insurance Housing Intimacy Leisure Time Physical Health Resilience Social Support Transportation  ADL's:  Impaired at this time, needs assistance but typically does not  Cognition:  Impaired,  Mild  Sleep:        Treatment Plan Summary: Daily contact with patient to assess and evaluate symptoms and progress in treatment, Medication management and Plan major depressive disorder, recurrent, severe without psychosis:  -Crisis stabilization -Medication management:  Started medical medications and a reduced amount of Zyprexa of 7.5 mg for mood stabilization, Ativan 0.5 mg BID for dystonia, and Trazodone 50 mg at bedtime PRN sleep. -Individual counseling  Disposition: Recommend psychiatric Inpatient admission when medically cleared.  Waylan Boga, NP 09/02/2016 10:43 AM  Patient seen face-to-face for psychiatric evaluation, chart reviewed and case discussed with the physician extender and developed treatment plan. Reviewed the information documented and agree with the treatment plan. Corena Pilgrim, MD

## 2016-09-02 NOTE — ED Notes (Addendum)
Patient needed personal hygiene and was given a shower by the sitter.

## 2016-09-02 NOTE — ED Notes (Signed)
Psych team spent an extensive amount of time w/ Pt and Pt's family.  Pt gave verbal permission for all information to be release to her husband.

## 2016-09-02 NOTE — ED Notes (Addendum)
Dr Rhunette CroftNanavati was called to examin pt du to pt' unsteady gait . Per staff pt was independent yesterday and ambulating without any assistance, Md suggested to hold off transport .  Also called Harriett SineNancy over [facility] for report , made her aware about new satus and that pt 's transport will be held until further evaluation .

## 2016-09-02 NOTE — ED Notes (Signed)
Danielle Rocha , son. Would like to be called when [facility].  [phone]

## 2016-09-02 NOTE — ED Notes (Signed)
Pt's speech is garbled, but she is denying complaints and needs.

## 2016-09-02 NOTE — BH Assessment (Signed)
[facility] Assessment Progress Note  Per Thedore MinsMojeed Akintayo, MD, this pt requires psychiatric hospitalization at this time.  At 14:27 Enedina Finneranicia calls from [facility] to report that pt has been accepted to their facility by Dr Jeannine KittenFarah, contingent upon her being placed under IVC.  [name], DNP and EDP Azalia BilisKevin Campos, MD agree to this, and Dr Patria Maneampos has initiated IVC.  [facility] [facility], and at 14:56 Hart CarwinMagistrate Haynes confirms receipt.  As of this writing, service of Findings and Custody Order is pending; [facility]C [facility] will be [facility] thereafter.  Pt's nurse has been notified, and agrees to call report to [phone].  Pt is to be transported via Patent examinerlaw enforcement when the time comes.Doylene Canning.  Amarri Michaelson, MA Triage Specialist [phone]

## 2016-09-02 NOTE — ED Notes (Signed)
Pt's husband and son at bedside.  Husband asking for an update.  This Clinical research associatewriter informed him that we were still awaiting placement.  Husband asking if the MD was going to speak w/ him and how he would be updated regarding Pt's Inpatient placement?  Informed that if he was here when the psych team comes through that they would be happy to speak w/ him.    Per TTS, this writer will inform husband that we can not give him information about placement, because he is not the HPOA and the Pt is unable to sign a consent.

## 2016-09-02 NOTE — ED Notes (Signed)
Pt unable to feed herself this morning and only speaking in one word answers.  Staff familiar w/ Pt sts she was able to feed herself, ambulate to the restroom, and was speaking full sentences yesterday and last night.

## 2016-09-03 ENCOUNTER — Emergency Department (HOSPITAL_COMMUNITY): Payer: BLUE CROSS/BLUE SHIELD

## 2016-09-03 LAB — CBC WITH DIFFERENTIAL/PLATELET
Basophils Absolute: 0 10*3/uL (ref 0.0–0.1)
Basophils Relative: 0 %
EOS PCT: 1 %
Eosinophils Absolute: 0 10*3/uL (ref 0.0–0.7)
HCT: 36.6 % (ref 36.0–46.0)
Hemoglobin: 12.9 g/dL (ref 12.0–15.0)
LYMPHS ABS: 2.1 10*3/uL (ref 0.7–4.0)
LYMPHS PCT: 29 %
MCH: 30.6 pg (ref 26.0–34.0)
MCHC: 35.2 g/dL (ref 30.0–36.0)
MCV: 86.7 fL (ref 78.0–100.0)
MONO ABS: 1.2 10*3/uL — AB (ref 0.1–1.0)
MONOS PCT: 17 %
Neutro Abs: 3.7 10*3/uL (ref 1.7–7.7)
Neutrophils Relative %: 53 %
PLATELETS: 125 10*3/uL — AB (ref 150–400)
RBC: 4.22 MIL/uL (ref 3.87–5.11)
RDW: 12.8 % (ref 11.5–15.5)
WBC: 7 10*3/uL (ref 4.0–10.5)

## 2016-09-03 LAB — I-STAT TROPONIN, ED: Troponin i, poc: 0 ng/mL (ref 0.00–0.08)

## 2016-09-03 LAB — COMPREHENSIVE METABOLIC PANEL
ALBUMIN: 3.3 g/dL — AB (ref 3.5–5.0)
ALT: 21 U/L (ref 14–54)
AST: 30 U/L (ref 15–41)
Alkaline Phosphatase: 41 U/L (ref 38–126)
Anion gap: 6 (ref 5–15)
BILIRUBIN TOTAL: 0.6 mg/dL (ref 0.3–1.2)
BUN: 22 mg/dL — AB (ref 6–20)
CHLORIDE: 109 mmol/L (ref 101–111)
CO2: 26 mmol/L (ref 22–32)
Calcium: 8.6 mg/dL — ABNORMAL LOW (ref 8.9–10.3)
Creatinine, Ser: 1.02 mg/dL — ABNORMAL HIGH (ref 0.44–1.00)
GFR calc Af Amer: >60 mL/min (ref >60)
GFR calc non Af Amer: 57 mL/min — ABNORMAL LOW (ref >60)
GLUCOSE: 106 mg/dL — AB (ref 65–99)
POTASSIUM: 3.8 mmol/L (ref 3.5–5.1)
Sodium: 141 mmol/L (ref 135–145)
TOTAL PROTEIN: 5.9 g/dL — AB (ref 6.5–8.1)

## 2016-09-03 LAB — URINALYSIS, ROUTINE W REFLEX MICROSCOPIC
Bilirubin Urine: NEGATIVE
Glucose, UA: NEGATIVE mg/dL
Hgb urine dipstick: NEGATIVE
KETONES UR: 5 mg/dL — AB
NITRITE: NEGATIVE
PH: 5 (ref 5.0–8.0)
Protein, ur: 30 mg/dL — AB
SPECIFIC GRAVITY, URINE: 1.027 (ref 1.005–1.030)

## 2016-09-03 LAB — I-STAT CG4 LACTIC ACID, ED: Lactic Acid, Venous: 1.06 mmol/L (ref 0.5–1.9)

## 2016-09-03 MED ORDER — SODIUM CHLORIDE 0.9 % IV BOLUS (SEPSIS)
500.0000 mL | Freq: Once | INTRAVENOUS | Status: AC
  Administered 2016-09-03: 500 mL via INTRAVENOUS

## 2016-09-03 MED ORDER — CEPHALEXIN 500 MG PO CAPS
500.0000 mg | ORAL_CAPSULE | Freq: Three times a day (TID) | ORAL | Status: DC
  Administered 2016-09-03 – 2016-09-05 (×7): 500 mg via ORAL
  Filled 2016-09-03 (×7): qty 1

## 2016-09-03 MED ORDER — CEPHALEXIN 500 MG PO CAPS
500.0000 mg | ORAL_CAPSULE | Freq: Once | ORAL | Status: AC
  Administered 2016-09-03: 500 mg via ORAL
  Filled 2016-09-03: qty 1

## 2016-09-03 NOTE — ED Notes (Signed)
Dr. Madilyn Hookees, ER physician,  assessing patient.

## 2016-09-03 NOTE — ED Notes (Signed)
Sitter aware that patient needs to give urine specimen.  Patient resting after visit with husband and son.  Husband reported that patient does not have abnormal gait at home.  He also mentioned patient's hands are shaking more than usual.

## 2016-09-03 NOTE — ED Notes (Signed)
BP manual 72/50.

## 2016-09-03 NOTE — BH Assessment (Signed)
Reassessment:   Patient reassessed on this day. She is a 65 y/o female that arrived to [facility] via EMS on 04/01/2016. According to EMS she took 9 Trazadone 180m  (filled 07/08/16 with quantity of 90. She is prescribed 2 tablets qhs). She also ingested an unknown amount of Lorazapem. Writer met with patient face to face for a evaluation.  Patient was slow to respond to questions. She did deny suicidal ideations at this time. Patient admitting that she is still very depressed. She acknowledges trying to end her life as the reason for her admission. Patient denies HI and AVH's. She reports sleeping and resting well. Appetite is fair. Per Dr. ADarleene Cleaverand JWaylan Boga DNP, patient will continue to remain in the ED for placement. Patient has been referred to several facilities. [facility] call report to [phone] transport via law enforcement; paperwork completed. The transfer is pending as it was reported that patient issues with ambulating. EDP and nursing staff will reassess patient's mobility on this day. Patient's disposition is pending her mobility. TTS will follow up.

## 2016-09-03 NOTE — ED Notes (Signed)
Sitter reported patient very unsteady on her feet and needs one person assist.

## 2016-09-03 NOTE — ED Notes (Signed)
Dr. Rhunette CroftNanavati assisting with pt ambulation.

## 2016-09-03 NOTE — ED Notes (Signed)
Patient urinated but urine was not collected in hat as planned.

## 2016-09-03 NOTE — ED Provider Notes (Signed)
Called to evaluate patient for change in gait.  Pt with no complaints of pain or feeling as if she has difficulty walking.  She does state she feels like she leans forward to walk and that is new for her.  She has a nonfocal neurologic examination but does lean forward slightly while walking with toe stepping, close, shuffling gait.  She does benefit from assistance from staff and does appear to be at risk for falling.  Staff report strong smelling urine - will check UA.    UA c/w UTI - will treat with antibiotics and send cultures.  Pt is nontoxic on exam and has no systemic complaints.  No evidence of sepsis.  Her BP was low, BMP with mild rise in Cr compared to prior.  After IVF bolus pt's BP improved.  On prior chart and note review her BP has been as low as 80/50 in clinic visits.  Question if BP is chronic with associated effects from her psychiatric meds.  On repeat exam following fluids she denies complaints.  Her gait is significantly improved and does not require assistance.  She has been medically cleared at this time for psychiatric assessment and treatment. She will need to continue her antibiotics.  Please avoid sedating medications or medications that can lower her blood pressure.     Danielle FossaElizabeth Karrissa Parchment, MD 09/03/16 [phone]

## 2016-09-04 NOTE — ED Notes (Signed)
Patient has completed all her morning ADLs by herself.

## 2016-09-04 NOTE — ED Notes (Signed)
Pt ambulated to BR without assistance.  Sitter remains @ pt's door.

## 2016-09-04 NOTE — Consult Note (Signed)
[facility] Consult   Reason for Consult:  Intentional overdose Referring Physician:  EDP Patient Identification: Danielle Rocha MRN:  [id number] Principal Diagnosis: Major depressive disorder, recurrent severe without psychotic features [facility]) Diagnosis:   Patient Active Problem List   Diagnosis Date Noted  . Major depressive disorder, recurrent severe without psychotic features ([place]) [F33.2] 09/02/2016    Priority: High  . Intentional drug overdose ([place]) [T50.902A] 11/22/2015    Priority: High  . GERD (gastroesophageal reflux disease) [K21.9] 01/30/2016  . Atrial fibrillation ([place]) [I48.91]   . Chronic pain syndrome [G89.4]     Total Time spent with patient: 40mnutes  Subjective:   Danielle Quilteris a 65y.o. female patient admitted with suicide attempt.  HPI:  65yo female who presented to the ED after her husband found her with empty medication bottles, intentional overdose.  She has been cleared medically and is alert today.  She was having issues ambulating and taking care of ADLs independently but this has resolved.  Danielle Rocha able to shower and ambulate independently today.  Brighter affect and desires to be home by Christmas.  Agreeable to get help for her depression to return to her baseline of highly functioning mentally.  Past Psychiatric History: depression  Risk to Self: Suicidal Ideation: Yes-Currently Present (pt unable to confirm or deny; per reports pt overdosed) Suicidal Intent: Yes-Currently Present Is patient at risk for suicide?: Yes Suicidal Plan?: Yes-Currently Present Specify Current Suicidal Plan:  (pt overdosed prior to arrival ) Access to Means: Yes Specify Access to Suicidal Means:  (Trazadone and Larazapam) What has been your use of drugs/alcohol within the last 12 months?:  (unable to confirm or deny) How many times?:  (per hx pt attempted 11/2015 by overdose; unsure or addit. hx) Other Self Harm Risks:  (unk) Triggers for Past  Attempts: Other (Comment) (unk; pt unable to provide details ) Intentional Self Injurious Behavior:  (unk) Risk to Others: Homicidal Ideation:  (unable to confirm or deny ) Thoughts of Harm to Others:  (unable to confirm or deny ) Current Homicidal Intent:  (unable to confirm or deny ) Current Homicidal Plan:  (unable to confirm or deny) Access to Homicidal Means:  (unable to confirm or deny  ) Identified Victim:  (unk ) History of harm to others?:  (unable to confirm or deny ) Assessment of Violence:  (unk ) Violent Behavior Description:  (patient is calm but drowsy; unable to answer questions appro) Does patient have access to weapons?:  (unk) Criminal Charges Pending?:  (unk ) Does patient have a court date:  (unk) Prior Inpatient Therapy: Prior Inpatient Therapy: Yes Prior Therapy Dates:  ([facility] per history ) [facility] Reason for Treatment:  (suicide attempt by overdose per history ) Prior Outpatient Therapy: Prior Outpatient Therapy: Yes Prior Therapy Dates:  (curren t) Prior Therapy Facilty/Provider(s):  ([facility] Reason for Treatment:  (outpatient therapy and medication managment; depression) Does patient have an ACCT team?: No Does patient have Intensive In-House Services?  : No Does patient have [facility] services? : No Does patient have P4CC services?: No  Past Medical History:  Past Medical History:  Diagnosis Date  . Arthritis    DJD, low back, thumb  . Atrial fibrillation (HCC)    Xarelto anticoagulation. Flecainide antiarrythmic   . Chicken pox   . Chronic pain syndrome    On disability. History of bilateral hip pain, low back pain.  Gabapentin 400 BID, percocet 1 tablet daily per prior provider.   . Colon polyp    awaiting records  . Depression    zoloft 127m, remeron 37mper psychiatry. ambien 1022mer psychiatry to help wtih sleep element.   . Dystonia     described as psychogenic dystonia. Pain and twisting from upper chest and up ith triggers "wind, creamy food" on TID ativan per psychiatry previously.   . Fibromyalgia   . GERD (gastroesophageal reflux disease)    omeprazole OTC  . Goiter    states multiple imaging tests, has had biopsies  . Hyperlipidemia    [facility]m66m [facility] (left side of face and body decreased sensitivity than right face and side)  . TIA (transient ischemic attack)   . Vaginal atrophy    estrace vaginal cream    Past Surgical History:  Procedure Laterality Date  . ATRIAL FIBRILLATION ABLATION    . BIOPSY THYROID    . fusion l4-l5  2000  . LAMINECTOMY     L4-L5  . OTHER SURGICAL HISTORY     tennis elbow surgery  . piriformis release  1999  . TONSILLECTOMY AND ADENOIDECTOMY  age 4  .69VAGINAL HYSTERECTOMY  1993   Family History:  Family History  Problem Relation Age of Onset  . Alcohol abuse Mother   . Hypertension Father   . Alcohol abuse Father   . Pancreatic cancer Father   . Cancer Brother     spindle cell RLE  . Cancer Brother     tonsil cancer  . Skin cancer Brother    Family Psychiatric  History: son with substance abuse Social History:  History  Alcohol Use No     History  Drug Use No    Social History   Social History  . Marital status: Married    Spouse name: N/A  . Number of children: N/A  . Years of education: N/A   Social History Main Topics  . Smoking status: Never Smoker  . Smokeless tobacco: Never Used  . Alcohol use No  . Drug use: No  . Sexual activity: No   Other Topics Concern  . None   Social History Narrative   Family: Married. Husband works as drivGeophysicist/field seismologist walmNoblechildren from previous marriage 1 from current. Son stepAnnie Maines with them and has down's syndrome.       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        Hobbies: time with son   Additional Social History:    Allergies:   Allergies  Allergen Reactions  . Fish Allergy Rash    Shell Fish   . Reglan [Metoclopramide] Other (See Comments)    Suicidal thoughts  . Ciprofloxacin Diarrhea  . Clindamycin/Lincomycin Diarrhea  . Vibramycin [Doxycycline] Diarrhea  . Macrobid [Nitrofurantoin Monohyd Macro] Rash    Rash on lip  . Sulfa Antibiotics Rash    Labs:  Results for orders placed or performed during the hospital encounter of 09/01/16 (from the past 48 hour(s))  Urinalysis, Routine w reflex microscopic     Status: Abnormal   Collection Time: 09/03/16  1:19 PM  Result Value Ref Range   Color, Urine AMBER (A) YELLOW    Comment: BIOCHEMICALS MAY BE AFFECTED BY COLOR   APPearance HAZY (A) CLEAR   Specific Gravity, Urine 1.027 1.005 -  1.030   pH 5.0 5.0 - 8.0   Glucose, UA NEGATIVE NEGATIVE mg/dL   Hgb urine dipstick NEGATIVE NEGATIVE   Bilirubin Urine NEGATIVE NEGATIVE   Ketones, ur 5 (A) NEGATIVE mg/dL   Protein, ur 30 (A) NEGATIVE mg/dL   Nitrite NEGATIVE NEGATIVE   Leukocytes, UA LARGE (A) NEGATIVE   RBC / HPF 6-30 0 - 5 RBC/hpf   WBC, UA TOO NUMEROUS TO COUNT 0 - 5 WBC/hpf   Bacteria, UA MANY (A) NONE SEEN   Squamous Epithelial / LPF 0-5 (A) NONE SEEN   Mucous PRESENT    Ca Oxalate Crys, UA PRESENT    Non Squamous Epithelial 0-5 (A) NONE SEEN  Comprehensive metabolic panel     Status: Abnormal   Collection Time: 09/03/16  2:50 PM  Result Value Ref Range   Sodium 141 135 - 145 mmol/L   Potassium 3.8 3.5 - 5.1 mmol/L   Chloride 109 101 - 111 mmol/L   CO2 26 22 - 32 mmol/L   Glucose, Bld 106 (H) 65 - 99 mg/dL   BUN 22 (H) 6 - 20 mg/dL   Creatinine, Ser 1.02 (H) 0.44 - 1.00 mg/dL   Calcium 8.6 (L) 8.9 - 10.3 mg/dL   Total Protein 5.9 (L) 6.5 - 8.1 g/dL   Albumin 3.3 (L) 3.5 - 5.0 g/dL   AST 30 15 - 41 U/L   ALT 21 14 - 54 U/L   Alkaline Phosphatase 41 38 - 126 U/L   Total Bilirubin 0.6 0.3 - 1.2 mg/dL   GFR calc  non Af Amer 57 (L) >60 mL/min   GFR calc Af Amer >60 >60 mL/min    Comment: (NOTE) The eGFR has been calculated using the CKD EPI equation. This calculation has not been validated in all clinical situations. eGFR's persistently <60 mL/min signify possible Chronic Kidney Disease.    Anion gap 6 5 - 15  CBC with Differential     Status: Abnormal   Collection Time: 09/03/16  2:50 PM  Result Value Ref Range   WBC 7.0 4.0 - 10.5 K/uL   RBC 4.22 3.87 - 5.11 MIL/uL   Hemoglobin 12.9 12.0 - 15.0 g/dL   HCT 36.6 36.0 - 46.0 %   MCV 86.7 78.0 - 100.0 fL   MCH 30.6 26.0 - 34.0 pg   MCHC 35.2 30.0 - 36.0 g/dL   RDW 12.8 11.5 - 15.5 %   Platelets 125 (L) 150 - 400 K/uL   Neutrophils Relative % 53 %   Neutro Abs 3.7 1.7 - 7.7 K/uL   Lymphocytes Relative 29 %   Lymphs Abs 2.1 0.7 - 4.0 K/uL   Monocytes Relative 17 %   Monocytes Absolute 1.2 (H) 0.1 - 1.0 K/uL   Eosinophils Relative 1 %   Eosinophils Absolute 0.0 0.0 - 0.7 K/uL   Basophils Relative 0 %   Basophils Absolute 0.0 0.0 - 0.1 K/uL  I-stat troponin, ED     Status: None   Collection Time: 09/03/16  3:58 PM  Result Value Ref Range   Troponin i, poc 0.00 0.00 - 0.08 ng/mL   Comment 3            Comment: Due to the release kinetics of cTnI, a negative result within the first hours of the onset of symptoms does not rule out myocardial infarction with certainty. If myocardial infarction is still suspected, repeat the test at appropriate intervals.   I-Stat CG4 Lactic Acid, ED  Status: None   Collection Time: 09/03/16  4:01 PM  Result Value Ref Range   Lactic Acid, Venous 1.06 0.5 - 1.9 mmol/L    Current Facility-Administered Medications  Medication Dose Route Frequency Provider Last Rate Last Dose  . acetaminophen (TYLENOL) tablet 650 mg  650 mg Oral Q4H PRN Daleen Bo, MD      . alum & mag hydroxide-simeth (MAALOX/MYLANTA) 200-200-20 MG/5ML suspension 30 mL  30 mL Oral PRN Daleen Bo, MD      . atorvastatin  (LIPITOR) tablet 20 mg  20 mg Oral Daily Daleen Bo, MD   20 mg at 09/04/16 1610  . cephALEXin (KEFLEX) capsule 500 mg  500 mg Oral Q8H Quintella Reichert, MD   500 mg at 09/04/16 9604  . flecainide (TAMBOCOR) tablet 100 mg  100 mg Oral BID Daleen Bo, MD   100 mg at 09/04/16 5409  . ibuprofen (ADVIL,MOTRIN) tablet 600 mg  600 mg Oral Q8H PRN Daleen Bo, MD      . LORazepam (ATIVAN) tablet 0.5 mg  0.5 mg Oral BID Corena Pilgrim, MD   0.5 mg at 09/04/16 0952  . nicotine (NICODERM CQ - dosed in mg/24 hours) patch 21 mg  21 mg Transdermal Daily PRN Daleen Bo, MD      . OLANZapine (ZYPREXA) tablet 7.5 mg  7.5 mg Oral QHS Chenel Wernli, MD   7.5 mg at 09/03/16 2140  . ondansetron (ZOFRAN) tablet 4 mg  4 mg Oral Q8H PRN Daleen Bo, MD      . pantoprazole (PROTONIX) EC tablet 40 mg  40 mg Oral Daily Daleen Bo, MD   40 mg at 09/04/16 0952  . rivaroxaban (XARELTO) tablet 20 mg  20 mg Oral Q supper Daleen Bo, MD   20 mg at 09/03/16 1815  . traZODone (DESYREL) tablet 50 mg  50 mg Oral QHS PRN Corena Pilgrim, MD   50 mg at 09/03/16 2140   Current Outpatient Prescriptions  Medication Sig Dispense Refill  . acetaminophen (TYLENOL) 500 MG tablet Take 1,000 mg by mouth every 6 (six) hours as needed for mild pain, moderate pain, fever or headache.    Marland Kitchen atorvastatin (LIPITOR) 20 MG tablet Take 1 tablet (20 mg total) by mouth daily. 30 tablet 5  . divalproex (DEPAKOTE) 500 MG DR tablet Take 500 mg by mouth 3 (three) times daily.     . flecainide (TAMBOCOR) 100 MG tablet Take 1 tablet (100 mg total) by mouth 2 (two) times daily. 60 tablet 0  . gabapentin (NEURONTIN) 300 MG capsule Take 300 mg by mouth 2 (two) times daily.     Marland Kitchen HYDROcodone-acetaminophen (NORCO/VICODIN) 5-325 MG tablet Take 0.5-1 tablets by mouth 2 (two) times daily as needed for moderate pain or severe pain (may fill today. to last 1 month.). 30 tablet 0  . OLANZapine (ZYPREXA) 20 MG tablet Take 20 mg by mouth at bedtime.     Marland Kitchen omeprazole (PRILOSEC) 20 MG capsule Take 1 capsule (20 mg total) by mouth 2 (two) times daily before a meal. (Patient taking differently: Take 40 mg by mouth daily with breakfast. ) 60 capsule 6  . rivaroxaban (XARELTO) 20 MG TABS tablet Take 1 tablet (20 mg total) by mouth daily with supper. 30 tablet 0  . traZODone (DESYREL) 100 MG tablet Take 200 mg by mouth at bedtime.    . Melatonin 3 MG TABS Take 2 tablets by mouth at bedtime.     . Multiple Vitamin (MULTIVITAMIN WITH MINERALS) TABS tablet  Take 1 tablet by mouth daily.      Musculoskeletal: Strength & Muscle Tone: WDL Gait & Station:  WDL Patient leans: N/A  Psychiatric Specialty Exam: Physical Exam  Constitutional: She appears well-developed and well-nourished.  HENT:  Head: Normocephalic.  Respiratory: Effort normal.  Neurological: She is alert.  Psychiatric: Her speech is normal and behavior is normal. Judgment normal. Cognition and memory are impaired. She exhibits a depressed mood. She expresses suicidal ideation. She expresses suicidal plans.    Review of Systems  Psychiatric/Behavioral: Positive for depression and suicidal ideas.  All other systems reviewed and are negative.   Blood pressure 91/60, pulse 61, temperature 98.1 F (36.7 C), temperature source Oral, resp. rate 16, SpO2 96 %.There is no height or weight on file to calculate BMI.  General Appearance: Casual  Eye Contact:  Fair  Speech:  Normal  Volume:  Normal  Mood:  Depressed  Affect:  Congruent  Thought Process:  Coherent and Descriptions of Associations: Intact  Orientation:  Alert and oriented times 3  Thought Content:  Logical and Rumination  Suicidal Thoughts:  Yes.  with intent/plan  Homicidal Thoughts:  No  Memory:  Immediate;   Fair Recent;   Fair Remote;   Fair  Judgement:  Fair  Insight:  Fair  Psychomotor Activity:  Decreased  Concentration:  Concentration: Fair and Attention Span: Fair  Recall:  [employer] of Knowledge:  Fair   Language:  Fair  Akathisia:  No  Handed:  Right  AIMS (if indicated):     Assets:  Communication Skills Desire for Improvement Financial Resources/Insurance Housing Intimacy Leisure Time Physical Health Resilience Social Support Transportation  ADL's:  WDL  Cognition:  Normal  Sleep:        Treatment Plan Summary: Daily contact with patient to assess and evaluate symptoms and progress in treatment, Medication management and Plan major depressive disorder, recurrent, severe without psychosis:  -Crisis stabilization -Medication management:  Continue medical medications and a reduced amount of Zyprexa of 7.5 mg for mood stabilization, Ativan 0.5 mg BID for dystonia, and Trazodone 50 mg at bedtime PRN sleep. -Individual counseling  Disposition: Recommend psychiatric Inpatient admission when medically cleared.  Waylan Boga, NP 09/04/2016 11:17 AM  Patient seen face-to-face for psychiatric evaluation, chart reviewed and case discussed with the physician extender and developed treatment plan. Reviewed the information documented and agree with the treatment plan. Corena Pilgrim, MD

## 2016-09-04 NOTE — ED Notes (Signed)
[phone]) [facility] offer report; was notified that pt did not have a bed anymore since it was post 24 hrs of initial acceptance. I have communicated this to Tom with TTS.

## 2016-09-04 NOTE — BHH Counselor (Signed)
Clinician received a call from Lyla Sonarrie, a counselor [facility]. Clinician expressed that the pt is medically clear and is able to ambulate. Clinician ask Lyla SonCarrie if the bed that was given to the pt available?  Clinican noted from Valindaarrie, she will have Thayer OhmChris call to further discussed pt's bed availability.      Gwinda Passereylese D Bennett, MS, [facility], [facility] Triage Specialist [phone]

## 2016-09-04 NOTE — BH Assessment (Signed)
BHH Assessment Progress Note  At 12:34 this Clinical research associatewriter [facility] and spoke to Halchitahris.  I reported that pt is now medically cleared, but still in need of behavioral health admission.  I added that I have faxed updated information to him.  He reports that the bed offered on 09/02/2016 when pt was accepted by Dr Jeannine KittenFarah has been retained, and that pt's nurse is welcome to call report to [phone].  If [facility] has any problems [facility] pt, Thayer OhmChris will re-staff the case with Dr Jeannine KittenFarah.  [facility].  Pt is under IVC and is to be transported via Patent examinerlaw enforcement.  Doylene Canninghomas Harveen Flesch, MA Triage Specialist [phone]

## 2016-09-05 NOTE — ED Notes (Signed)
Patient exited room and stated "I got somewhere I got to go in the morning.  I wanted my discharge". Patient informed that he would have to speak with Dr. Effie ShyWentz again.   Dr. Effie ShyWentz notified.

## 2016-09-05 NOTE — ED Notes (Signed)
Night nurse communicated to this writer that due to report not being able to be communicated to [facility] that [facility] had lost her bed at that facility.

## 2016-09-05 NOTE — ED Notes (Signed)
2ng Attempted to call report.  Secretary said that the charge nurse was off the unit and she took my number and said that the charge nurse would call back.

## 2016-09-05 NOTE — ED Notes (Signed)
Husband and son visiting with patient.

## 2016-09-05 NOTE — BH Assessment (Signed)
BHH Assessment Progress Note  Per Thedore MinsMojeed Akintayo, MD, this pt requires psychiatric hospitalization.  Pt presents under IVC initiated by EDP Azalia BilisKevin Campos, MD.  At 14:14 Thayer Ohmhris calls [facility] report that [facility] has been [facility] their [facility] [address].  Claudette Headonrad Withrow, DNP concurs with this decision.  Pt's nurse, Aram BeechamCynthia, has been notified, and agrees to call report to [phone].  She understands that pt must arrived before 23:00 today or after 06:00 tomorrow.  Pt is under IVC, and is to be transported via Patent examinerlaw enforcement.   Doylene Canninghomas Jaydon Avina, MA Triage Specialist [phone]

## 2016-09-05 NOTE — ED Provider Notes (Signed)
EKG Interpretation  Date/Time:  Friday September 05 2016 11:51:24 EST Ventricular Rate:  60 PR Interval:  180 QRS Duration: 92 QT Interval:  374 QTC Calculation: 374 R Axis:   29 Text Interpretation:  Normal sinus rhythm Nonspecific T wave abnormality Abnormal ECG No significant change was found Confirmed by Kateryn Marasigan  MD, Caryn BeeKEVIN ([id number]) on 09/05/2016 12:40:13 PM         Azalia BilisKevin Briyanna Billingham, MD 09/05/16 1240

## 2016-09-05 NOTE — ED Notes (Signed)
Attempt[facility] to [facility].  Was asked to call back in 15 minutes due to a On unit emergency they were dealing with.

## 2016-09-05 NOTE — ED Notes (Signed)
Report [facility] ErinSierra.  Sheriff called for transport.

## 2016-09-06 LAB — URINE CULTURE

## 2016-09-07 ENCOUNTER — Telehealth: Payer: Self-pay

## 2016-09-07 NOTE — Telephone Encounter (Signed)
Post ED Visit - Positive Culture Follow-up  Culture report reviewed by antimicrobial stewardship pharmacist:  []  Enzo BiNathan Batchelder, Pharm.D. []  Celedonio MiyamotoJeremy Frens, [address]Ph[address].D., BCPS []  Garvin FilaMike Maccia, Pharm.D. []  Georgina PillionElizabeth Martin, Pharm.D., BCPS []  [place]h Pham, [place]Pharm.D., BCPS, AAHIVP []  Estella HuskMichelle Turner, Pharm.D., BCPS, AAHIVP []  Tennis Mustassie Stewart, Pharm.D. []  Rob Mound StationVincent, [address]Ph[address] Revonda StandardAllison Masters Pharm D Positive urine culture and no further patient follow-up is required at this time.  Jerry CarasCullom, Tuana Hoheisel Burnett 09/07/2016, 1:46 PM

## 2016-09-11 ENCOUNTER — Other Ambulatory Visit: Payer: Self-pay | Admitting: Family Medicine

## 2016-09-16 ENCOUNTER — Telehealth: Payer: Self-pay | Admitting: Family Medicine

## 2016-09-16 NOTE — Telephone Encounter (Signed)
Ms. Danielle Rocha came in the office saying she needs a refill for her "Norco" medication. Please give her a call regarding this if needed.  Pt's ph# [phone]  Thank you.

## 2016-09-18 NOTE — Telephone Encounter (Signed)
She is supposed to have in person visit. In addition- suicide attempt since last visit so she needs to be seen- can we get her in on Monday? She didn't even schedule an appointment

## 2016-09-19 NOTE — Telephone Encounter (Signed)
Please call pt and schedule an appointment with Dr. Durene CalHunter on Tuesday per him.

## 2016-09-23 ENCOUNTER — Ambulatory Visit (INDEPENDENT_AMBULATORY_CARE_PROVIDER_SITE_OTHER): Payer: BLUE CROSS/BLUE SHIELD | Admitting: Family Medicine

## 2016-09-23 ENCOUNTER — Encounter: Payer: Self-pay | Admitting: Family Medicine

## 2016-09-23 DIAGNOSIS — F332 Major depressive disorder, recurrent severe without psychotic features: Secondary | ICD-10-CM

## 2016-09-23 DIAGNOSIS — R3915 Urgency of urination: Secondary | ICD-10-CM | POA: Diagnosis not present

## 2016-09-23 DIAGNOSIS — T50902S Poisoning by unspecified drugs, medicaments and biological substances, intentional self-harm, sequela: Secondary | ICD-10-CM

## 2016-09-23 MED ORDER — HYDROCODONE-ACETAMINOPHEN 5-325 MG PO TABS: 0.5000 | ORAL_TABLET | Freq: Two times a day (BID) | ORAL | 0 refills | Status: DC | PRN

## 2016-09-23 NOTE — Patient Instructions (Signed)
Refilled pain medicine  So glad you are in a better place  Let's work hard to keep you in that better place though.   Would consistently see counselor AT least once a month over next two years  I would also like to have a letter from your psychiatrist team to state they believe it is safe for you to continue the hydrocodone  You agreed today to call 911 immediately if any thoughts of hurting yourself

## 2016-09-23 NOTE — Telephone Encounter (Signed)
Pt has been scheduled.  °

## 2016-09-23 NOTE — Assessment & Plan Note (Signed)
S: 2nd suicide attempt with ativan- confirms today that husband has helped her get rid of all ativan. Also used some old tramadol which hs been disposed of. Had some medication adjustments with zyprexa down to 7.5mg  and depakote down to 2 a night at bedtime, and gababentin down to 1 a night. She states she is im much better state of health and that her issues were triggered by not knowing the fate of her son who is a heroin addict and thay have not been able to find him in months and months.   [facility] listed her as bipolar II but unclear where this diagnosis came form- prior listed as major depression. We discussed having psychiatry weight in on this A/P: Had an extended discussion with patient today about her having any controlled substance such as vicodin at home which she could use to commit overdose attempt. She states last ativan episode was impulsive and without plan- she called a family friend right after but the plan was for her to call beforehand. She understands I do not want to place any medication in her possession that may harm her but on other hand with her chronic pain her quality of life is lower when she is not on medicine and that may worsen her depression as well. I did refill her vicodin today but patient is able to contract to calling 911 immediately at time she has any suicidal thoughts- in addition I asked before next prescription that she get a letter form her psychiatrist team stating they believe it is safe for ongoing prescription. Also encouraged regularly seeing her counseling for at least 2 years- she had stopped seeing her previously due to such improvement in her depression.

## 2016-09-23 NOTE — Progress Notes (Signed)
Pre visit review using our clinic review tool, if applicable. No additional management support is needed unless otherwise documented below in the visit note. 

## 2016-09-23 NOTE — Assessment & Plan Note (Addendum)
See drug overdose section dated 09/23/16 as well. Hopeful for opinion from her psychiatry team about notes on possible bipolar

## 2016-09-23 NOTE — Progress Notes (Addendum)
Subjective:  Danielle Rocha is a 66 y.o. year old very pleasant female patient who presents for/with See problem oriented charting ROS- no SI. Admits to some anhedonia but much improved. Depressed mood improved. Still deals with chronic diffuse pain  Past Medical History-  Patient Active Problem List   Diagnosis Date Noted  . Intentional drug overdose (HCC) 11/22/2015    Priority: High  . Atrial fibrillation (HCC)     Priority: High  . Chronic pain syndrome     Priority: High  . Major depressive disorder, recurrent severe without psychotic features (HCC) 09/02/2016  . GERD (gastroesophageal reflux disease) 01/30/2016    Medications- reviewed and updated Current Outpatient Prescriptions  Medication Sig Dispense Refill  . acetaminophen (TYLENOL) 500 MG tablet Take 1,000 mg by mouth every 6 (six) hours as needed for mild pain, moderate pain, fever or headache.    Marland Kitchen atorvastatin (LIPITOR) 20 MG tablet Take 1 tablet (20 mg total) by mouth daily. 30 tablet 5  . divalproex (DEPAKOTE) 500 MG DR tablet Take 500 mg by mouth 3 (three) times daily.     . flecainide (TAMBOCOR) 100 MG tablet Take 1 tablet (100 mg total) by mouth 2 (two) times daily. 60 tablet 0  . gabapentin (NEURONTIN) 300 MG capsule Take 300 mg by mouth daily.     Marland Kitchen HYDROcodone-acetaminophen (NORCO/VICODIN) 5-325 MG tablet Take 0.5-1 tablets by mouth 2 (two) times daily as needed for moderate pain or severe pain (may fill today. to last 1 month.). 30 tablet 0  . Melatonin 3 MG TABS Take 2 tablets by mouth at bedtime.     . Multiple Vitamin (MULTIVITAMIN WITH MINERALS) TABS tablet Take 1 tablet by mouth daily.    Marland Kitchen OLANZapine (ZYPREXA) 20 MG tablet Take 7.5 mg by mouth at bedtime.     Marland Kitchen omeprazole (PRILOSEC) 20 MG capsule TAKE ONE CAPSULE BY MOUTH TWICE DAILY BEFORE A MEAL 60 capsule 6  . rivaroxaban (XARELTO) 20 MG TABS tablet Take 1 tablet (20 mg total) by mouth daily with supper. 30 tablet 0   No current facility-administered  medications for this visit.     Objective: BP 102/70 (BP Location: Left Arm, Patient Position: Sitting, Cuff Size: Normal)   Pulse 83   Temp 97.6 F (36.4 C) (Oral)   Ht 5\' 7"  (1.702 m)   Wt 146 lb 12.8 oz (66.6 kg)   SpO2 96%   BMI 22.99 kg/m  Gen: NAD, resting comfortably in chair. Son Jeannett Senior (downs syndrome) is with her  Assessment/Plan:   Intentional drug overdose (HCC) S: 2nd suicide attempt with ativan- confirms today that husband has helped her get rid of all ativan. Also used some old tramadol which hs been disposed of. Had some medication adjustments with zyprexa down to 7.5mg  and depakote down to 2 a night at bedtime, and gababentin down to 1 a night. She states she is im much better state of health and that her issues were triggered by not knowing the fate of her son who is a heroin addict and thay have not been able to find him in months and months.   [facility] psych through [facility] listed her as bipolar II but unclear where this diagnosis came form- prior listed as major depression. We discussed having psychiatry weight in on this A/P: Had an extended discussion with patient today about her having any controlled substance such as vicodin at home which she could use to commit overdose attempt. She states last ativan episode was impulsive and  without plan- she called a family friend right after but the plan was for her to call beforehand. She understands I do not want to place any medication in her possession that may harm her but on other hand with her chronic pain her quality of life is lower when she is not on medicine and that may worsen her depression as well. I did refill her vicodin today but patient is able to contract to calling 911 immediately at time she has any suicidal thoughts- in addition I asked before next prescription that she get a letter form her psychiatrist team stating they believe it is safe for ongoing prescription. Also encouraged regularly seeing her counseling  for at least 2 years- she had stopped seeing her previously due to such improvement in her depression.   monthly rx for at least 6 months planned at this point until clear she will keep regular counseling/psychiatry follow up  Also had planned on urine culture to follow up on urgency she has- had prior completed keflex course- Asher MuirJamie will reach out to patient to see if can come by to drop urine off  Meds ordered this encounter  Medications  . HYDROcodone-acetaminophen (NORCO/VICODIN) 5-325 MG tablet    Sig: Take 0.5-1 tablets by mouth 2 (two) times daily as needed for moderate pain or severe pain (may fill today. to last 1 month.).    Dispense:  30 tablet    Refill:  0   The duration of face-to-face time during this visit was greater than 25 minutes. Greater than 50% of this time was spent in counseling in regards to depression and her potential suicidal risk  Return precautions advised.  Tana ConchStephen Hobert Poplaski, MD

## 2016-09-25 ENCOUNTER — Other Ambulatory Visit: Payer: Self-pay

## 2016-09-25 DIAGNOSIS — R3915 Urgency of urination: Secondary | ICD-10-CM

## 2016-09-25 NOTE — Progress Notes (Signed)
Spoke with patient who stated she would come by and provide a urine specimen. Order entered for culture.

## 2016-10-02 NOTE — Addendum Note (Signed)
Addended by: Bonnye FavaKWEI, Karstyn Birkey K on: 10/02/2016 02:38 PM   Modules accepted: Orders

## 2016-10-05 LAB — URINE CULTURE

## 2016-10-07 ENCOUNTER — Ambulatory Visit (INDEPENDENT_AMBULATORY_CARE_PROVIDER_SITE_OTHER): Payer: BLUE CROSS/BLUE SHIELD

## 2016-10-07 DIAGNOSIS — N390 Urinary tract infection, site not specified: Secondary | ICD-10-CM

## 2016-10-07 MED ORDER — CEFTRIAXONE SODIUM 1 G IJ SOLR
1.0000 g | Freq: Once | INTRAMUSCULAR | Status: AC
  Administered 2016-10-07: 1 g via INTRAMUSCULAR

## 2016-10-07 NOTE — Progress Notes (Signed)
Patient received Ceftriaxone 1gm on 10/07/16 at 10.45am.Given by Mendel CorningNancy Kigotho CMA

## 2016-10-21 ENCOUNTER — Other Ambulatory Visit: Payer: Self-pay | Admitting: Family Medicine

## 2016-10-22 ENCOUNTER — Other Ambulatory Visit: Payer: Self-pay

## 2016-10-22 MED ORDER — HYDROCODONE-ACETAMINOPHEN 5-325 MG PO TABS: 0.5000 | ORAL_TABLET | Freq: Two times a day (BID) | ORAL | 0 refills | Status: DC | PRN

## 2016-11-20 ENCOUNTER — Other Ambulatory Visit: Payer: Self-pay | Admitting: Family Medicine

## 2016-11-24 ENCOUNTER — Other Ambulatory Visit: Payer: Self-pay | Admitting: Family Medicine

## 2016-11-24 ENCOUNTER — Telehealth: Payer: Self-pay

## 2016-11-24 MED ORDER — HYDROCODONE-ACETAMINOPHEN 5-325 MG PO TABS: 0.5000 | ORAL_TABLET | Freq: Two times a day (BID) | ORAL | 0 refills | Status: DC | PRN

## 2016-11-24 NOTE — Telephone Encounter (Signed)
Called patient and let her know that her prescription is at the front desk and can be picked up [facility]

## 2016-11-25 ENCOUNTER — Ambulatory Visit (INDEPENDENT_AMBULATORY_CARE_PROVIDER_SITE_OTHER): Payer: BLUE CROSS/BLUE SHIELD | Admitting: Family Medicine

## 2016-11-25 ENCOUNTER — Encounter: Payer: Self-pay | Admitting: Family Medicine

## 2016-11-25 VITALS — BP 102/66 | HR 77 | Temp 97.7°F | Ht 67.0 in | Wt 148.8 lb

## 2016-11-25 DIAGNOSIS — M7551 Bursitis of right shoulder: Secondary | ICD-10-CM | POA: Diagnosis not present

## 2016-11-25 MED ORDER — RIVAROXABAN 20 MG PO TABS: 20.0000 mg | ORAL_TABLET | Freq: Every day | ORAL | 3 refills | Status: DC

## 2016-11-25 MED ORDER — PREDNISONE 20 MG PO TABS: ORAL_TABLET | ORAL | 0 refills | Status: DC

## 2016-11-25 NOTE — Progress Notes (Signed)
Pre visit review using our clinic review tool, if applicable. No additional management support is needed unless otherwise documented below in the visit note. 

## 2016-11-25 NOTE — Patient Instructions (Addendum)
I think this is bursitis (inflammed sac in shoulder)  Lets trial a course of prednisone  Lets follow up in about 2  Weeks if not at least 50% better. Could consider a steroid shot  Start exercises after about 2-3 days and only do them if pain 2-3/10 or less

## 2016-11-25 NOTE — Progress Notes (Signed)
Subjective:  Danielle Rocha is a 66 y.o. year old very pleasant female patient who presents for/with See problem oriented charting ROS- no chest pain or shortness of breath. No hand weakness. No paresthesias. No neck pain.    Past Medical History-  Patient Active Problem List   Diagnosis Date Noted  . Major depressive disorder, recurrent severe without psychotic features (HCC) 09/02/2016    Priority: High  . Intentional drug overdose (HCC) 11/22/2015    Priority: High  . Atrial fibrillation (HCC)     Priority: High  . Chronic pain syndrome     Priority: High  . GERD (gastroesophageal reflux disease) 01/30/2016    Priority: Low  . UTI (urinary tract infection) 10/07/2016    Medications- reviewed and updated Current Outpatient Prescriptions  Medication Sig Dispense Refill  . acetaminophen (TYLENOL) 500 MG tablet Take 1,000 mg by mouth every 6 (six) hours as needed for mild pain, moderate pain, fever or headache.    Marland Kitchen. atorvastatin (LIPITOR) 20 MG tablet Take 1 tablet (20 mg total) by mouth daily. 30 tablet 5  . flecainide (TAMBOCOR) 100 MG tablet Take 1 tablet (100 mg total) by mouth 2 (two) times daily. 60 tablet 0  . HYDROcodone-acetaminophen (NORCO/VICODIN) 5-325 MG tablet Take 0.5-1 tablets by mouth 2 (two) times daily as needed for moderate pain or severe pain (may fill 10/24/16 to last 1 month.). 30 tablet 0  . Melatonin 3 MG TABS Take 2 tablets by mouth at bedtime.     . Multiple Vitamin (MULTIVITAMIN WITH MINERALS) TABS tablet Take 1 tablet by mouth daily.    Marland Kitchen. omeprazole (PRILOSEC) 20 MG capsule TAKE ONE CAPSULE BY MOUTH TWICE DAILY BEFORE A MEAL 60 capsule 6  . rivaroxaban (XARELTO) 20 MG TABS tablet Take 1 tablet (20 mg total) by mouth daily with supper. 30 tablet 0  . divalproex (DEPAKOTE) 500 MG DR tablet Take by mouth.     No current facility-administered medications for this visit.     Objective: BP 102/66 (BP Location: Left Arm, Patient Position: Sitting, Cuff Size:  Normal)   Pulse 77   Temp 97.7 F (36.5 C) (Oral)   Ht 5\' 7"  (1.702 m)   Wt 148 lb 12.8 oz (67.5 kg)   SpO2 97%   BMI 23.31 kg/m  Gen: NAD, resting comfortably CV: RRR no murmurs rubs or gallops Lungs: CTAB no crackles, wheeze, rhonchi Ext: no edema Skin: warm, dry  Right Shoulder: Inspection reveals no abnormalities, atrophy or asymmetry though there is mild warmth Palpation is normal with no tenderness over AC joint. But does have some pain in bicipital groove. ROM is full in all planes. Slow movement due to pain Rotator cuff strength normal throughout. Signs of impingement with negative Neer and Hawkins tests, empty can. Hawkin is the most painful of all maneuvers Speeds and Yergason's tests normal. With pain in bicipital groove- somewhat suprising painful arc but no drop arm sign.  Assessment/Plan:  Bursitis of right shoulder S: started with right shoulder pain about 3-4 weeks ago. No fall or injury. No repetitive motion around the time this started hurting. Pain can go up to 7-8/10. Very mild pain at rest. Cannot sleep on that side due to pain. Never happened before.  A/P: Exam and history consistent with bursitis. Will trial course of prednisone. Start exercises after a few days. Advised icing/heat. See avs for further information and follow up info  We also touched base and she states no Si. States depression continues to do  much better.   Meds ordered this encounter  Medications  . rivaroxaban (XARELTO) 20 MG TABS tablet    Sig: Take 1 tablet (20 mg total) by mouth daily with supper.    Dispense:  90 tablet    Refill:  3  . predniSONE (DELTASONE) 20 MG tablet    Sig: Take 2 pills for 3 days, 1 pill for 4 days    Dispense:  10 tablet    Refill:  0    Return precautions advised.  Tana Conch, MD

## 2016-11-26 ENCOUNTER — Ambulatory Visit: Payer: Self-pay | Admitting: Family Medicine

## 2016-12-03 ENCOUNTER — Other Ambulatory Visit: Payer: Self-pay

## 2016-12-03 MED ORDER — ATORVASTATIN CALCIUM 20 MG PO TABS: 20.0000 mg | ORAL_TABLET | Freq: Every day | ORAL | 2 refills | Status: DC

## 2016-12-22 ENCOUNTER — Other Ambulatory Visit: Payer: Self-pay | Admitting: Family Medicine

## 2016-12-22 MED ORDER — HYDROCODONE-ACETAMINOPHEN 5-325 MG PO TABS: 0.5000 | ORAL_TABLET | Freq: Two times a day (BID) | ORAL | 0 refills | Status: DC | PRN

## 2016-12-23 ENCOUNTER — Other Ambulatory Visit: Payer: Self-pay

## 2016-12-23 MED ORDER — HYDROCODONE-ACETAMINOPHEN 5-325 MG PO TABS: 0.5000 | ORAL_TABLET | Freq: Two times a day (BID) | ORAL | 0 refills | Status: DC | PRN

## 2017-01-13 ENCOUNTER — Other Ambulatory Visit: Payer: Self-pay | Admitting: Family Medicine

## 2017-01-14 NOTE — Telephone Encounter (Signed)
We need to get her back to cardiology- seen last January 2017. I dont mind short term fill to get her to appointment but lets call her to stress getting back in with cardiology please

## 2017-01-21 ENCOUNTER — Other Ambulatory Visit: Payer: Self-pay | Admitting: Family Medicine

## 2017-01-22 ENCOUNTER — Other Ambulatory Visit: Payer: Self-pay

## 2017-01-22 MED ORDER — HYDROCODONE-ACETAMINOPHEN 5-325 MG PO TABS: 0.5000 | ORAL_TABLET | Freq: Two times a day (BID) | ORAL | 0 refills | Status: DC | PRN

## 2017-01-23 ENCOUNTER — Ambulatory Visit (INDEPENDENT_AMBULATORY_CARE_PROVIDER_SITE_OTHER): Payer: BLUE CROSS/BLUE SHIELD | Admitting: Family Medicine

## 2017-01-23 ENCOUNTER — Encounter: Payer: Self-pay | Admitting: Family Medicine

## 2017-01-23 VITALS — BP 102/58 | HR 74 | Temp 97.7°F | Ht 67.0 in | Wt 150.4 lb

## 2017-01-23 DIAGNOSIS — F332 Major depressive disorder, recurrent severe without psychotic features: Secondary | ICD-10-CM | POA: Diagnosis not present

## 2017-01-23 DIAGNOSIS — I482 Chronic atrial fibrillation, unspecified: Secondary | ICD-10-CM

## 2017-01-23 DIAGNOSIS — G894 Chronic pain syndrome: Secondary | ICD-10-CM

## 2017-01-23 MED ORDER — HYDROCODONE-ACETAMINOPHEN 5-325 MG PO TABS: 0.5000 | ORAL_TABLET | Freq: Two times a day (BID) | ORAL | 0 refills | Status: DC | PRN

## 2017-01-23 MED ORDER — RIVAROXABAN 20 MG PO TABS: 20.0000 mg | ORAL_TABLET | Freq: Every day | ORAL | 3 refills | Status: DC

## 2017-01-23 NOTE — Progress Notes (Signed)
Pre visit review using our clinic review tool, if applicable. No additional management support is needed unless otherwise documented below in the visit note. 

## 2017-01-23 NOTE — Progress Notes (Signed)
Subjective:  Danielle Rocha is a 66 y.o. year old very pleasant female patient who presents for/with See problem oriented charting ROS- admits to chronic back pain and bilaeral hip pain going down to knees. Denies anhedonia or SI but admits it is difficult to deal with her long term pain and can get her down at times   Past Medical History-  Patient Active Problem List   Diagnosis Date Noted  . Major depressive disorder, recurrent severe without psychotic features (HCC) 09/02/2016    Priority: High  . Intentional drug overdose (HCC) 11/22/2015    Priority: High  . Atrial fibrillation (HCC)     Priority: High  . Chronic pain syndrome     Priority: High  . GERD (gastroesophageal reflux disease) 01/30/2016    Priority: Low  . UTI (urinary tract infection) 10/07/2016    Medications- reviewed and updated Current Outpatient Prescriptions  Medication Sig Dispense Refill  . acetaminophen (TYLENOL) 500 MG tablet Take 1,000 mg by mouth every 6 (six) hours as needed for mild pain, moderate pain, fever or headache.    Marland Kitchen atorvastatin (LIPITOR) 20 MG tablet Take 1 tablet (20 mg total) by mouth daily. 90 tablet 2  . flecainide (TAMBOCOR) 100 MG tablet TAKE ONE TABLET BY MOUTH TWICE DAILY 120 tablet 3  . gabapentin (NEURONTIN) 300 MG capsule Take 300 mg by mouth at bedtime.    Marland Kitchen HYDROcodone-acetaminophen (NORCO/VICODIN) 5-325 MG tablet Take 0.5-1 tablets by mouth 2 (two) times daily as needed for moderate pain or severe pain (may fill today). 30 tablet 0  . Melatonin 3 MG TABS Take 2 tablets by mouth at bedtime.     . Multiple Vitamin (MULTIVITAMIN WITH MINERALS) TABS tablet Take 1 tablet by mouth daily.    Marland Kitchen omeprazole (PRILOSEC) 20 MG capsule TAKE ONE CAPSULE BY MOUTH TWICE DAILY BEFORE A MEAL 60 capsule 6  . rivaroxaban (XARELTO) 20 MG TABS tablet Take 1 tablet (20 mg total) by mouth daily with supper. 90 tablet 3  . divalproex (DEPAKOTE) 500 MG DR tablet Take by mouth.     No current  facility-administered medications for this visit.     Objective: BP (!) 102/58 (BP Location: Left Arm, Patient Position: Sitting, Cuff Size: Normal)   Pulse 74   Temp 97.7 F (36.5 C) (Oral)   Ht 5\' 7"  (1.702 m)   Wt 150 lb 6.4 oz (68.2 kg)   SpO2 98%   BMI 23.56 kg/m  Gen: NAD, resting comfortably CV: RRR no murmurs rubs or gallops Lungs: CTAB no crackles, wheeze, rhonchi Ext: no edema Skin: warm, dry  Back - Normal skin, Spine with normal alignment and no deformity.  No tenderness to vertebral process palpation.  Paraspinous muscles are tender but without spasm in lumbar region.   Range of motion is full at neck and lumbar sacral regions. Negative Straight leg raise.  Neuro- no saddle anesthesia, 5/5 strength lower extremities  Assessment/Plan:  Major depressive disorder, recurrent severe without psychotic features (HCC) S: She continues with psychiatry every 6 weeks and counseling every 2 weeks. She states she has had no SI in last 5 months A/P: considering her prior suicidal thoughts I have encouraged patien tto continue counseling and psychiatry - in fact this is one of the requirements for continuing pain management here.    Atrial fibrillation (HCC) S: appears to be in atrial fibrillation. She is on xarelto and compliant for anticoagulation. She is on flecainide to help maintain her rhythm A/P: Patient tells me  cardiology told her to come back only as needed but last note from early 2017 stated follow up in the fall. I do not mind prescribing the flecainide but would want to have her "released" from cardiology with monitoring parameters, reasons to get her plugged back in with cardiology.    Chronic pain syndrome S: on disability due to her pain. History of bilateral hip pain, low back pain for years. The hip pain seems to radiate down into both legs about to the knee. She has had workup by orthopedics (piriformis release 1999, multiple injections into back and hips),  neurosurgery(laminectomy l4-l5 2000) , pain management (did not want to try spinal stimulator as final option given) and continues to have hip and back pain.  She has had some knee pain in the past after a fall but seemed to do better after injections.   She had been on Gabapentin 400 BID, percocet 1 tablet daily per prior provider when we started seeing each other a few years ago--> appears plan was to wean completely as previously on much higher/more frequent doses. She did attempt suicide previouslyy and we stopped narcotics completely but she had worsening pain which seemed to worsen depression. She had a 2nd suicide attempt after she stopped seeing psychiatry and counseling. We have agreed at this point only to use pain management in ouf office if she continues to see psychiatry and counselor.   She is on vicodin 1 tablet total daily- whether she uses all in the evening or half tablet BID. She feels her pain has been worsening and she is spending most time in bed and even missing church which is very important to her.   A/P:Pain contract 12/31/15. Reviewed [employer] [facility] and has only been getting monthly rx. She has been stable in regards to depression (of note gabapentin now through psychiatry). We will refer her to pain management to see if they can offer additional modalities to help her deal with pain. I did agree if she is not accepted to continue current dose but not escalate dose of vicodin. We reviewed the STOP act requirements and upcoming [place] policy in regards to pain management if done in our office (visit every 3 months just focused on pain management, yearly UDS)   3 months  Orders Placed This Encounter  Procedures  . Ambulatory referral to Pain Clinic    Referral Priority:   Routine    Referral Type:   Consultation    Referral Reason:   Specialty Services Required    Requested Specialty:   Pain Medicine    Number of Visits Requested:   1    Meds ordered this encounter   Medications  . gabapentin (NEURONTIN) 300 MG capsule    Sig: Take 300 mg by mouth at bedtime.  . rivaroxaban (XARELTO) 20 MG TABS tablet    Sig: Take 1 tablet (20 mg total) by mouth daily with supper.    Dispense:  90 tablet    Refill:  3  . HYDROcodone-acetaminophen (NORCO/VICODIN) 5-325 MG tablet    Sig: Take 0.5-1 tablets by mouth 2 (two) times daily as needed for moderate pain or severe pain (may fill today).    Dispense:  30 tablet    Refill:  0  . HYDROcodone-acetaminophen (NORCO/VICODIN) 5-325 MG tablet    Sig: Take 0.5-1 tablets by mouth 2 (two) times daily as needed for moderate pain or severe pain (may fill in 1 month).    Dispense:  30 tablet  Refill:  0  . HYDROcodone-acetaminophen (NORCO/VICODIN) 5-325 MG tablet    Sig: Take 0.5-1 tablets by mouth 2 (two) times daily as needed for moderate pain or severe pain (may fill in 2 months).    Dispense:  30 tablet    Refill:  0    Return precautions advised.  Tana Conch, MD

## 2017-01-23 NOTE — Patient Instructions (Addendum)
See us back in 3 months   Please call cardiology for follow up- I would want cardiology to clearly state they are ok with me prescribing long term flecainide and give monitoring parameters and reason for return  I referred you to pain management today. May take some time to get this processed. Would check in 1 month from now with us if havent heard    WE NOW OFFER   [facility] FAST TRACK!!!  SAME DAY Appointments for ACUTE CARE  Such as: Sprains, Injuries, cuts, abrasions, rashes, muscle pain, joint pain, back pain Colds, flu, sore throats, headache, allergies, cough, fever  Ear pain, sinus and eye infections Abdominal pain, nausea, vomiting, diarrhea, upset stomach Animal/insect bites  3 Easy Ways to Schedule: Walk-In Scheduling Call in scheduling Mychart Sign-up: [email or web address]

## 2017-01-24 NOTE — Assessment & Plan Note (Signed)
S: She continues with psychiatry every 6 weeks and counseling every 2 weeks. She states she has had no SI in last 5 months A/P: considering her prior suicidal thoughts I have encouraged patien tto continue counseling and psychiatry - in fact this is one of the requirements for continuing pain management here.

## 2017-01-24 NOTE — Assessment & Plan Note (Signed)
S: on disability due to her pain. History of bilateral hip pain, low back pain for years. The hip pain seems to radiate down into both legs about to the knee. She has had workup by orthopedics (piriformis release 1999, multiple injections into back and hips), neurosurgery(laminectomy l4-l5 2000) , pain management (did not want to try spinal stimulator as final option given) and continues to have hip and back pain.  She has had some knee pain in the past after a fall but seemed to do better after injections.   She had been on Gabapentin 400 BID, percocet 1 tablet daily per prior provider when we started seeing each other a few years ago--> appears plan was to wean completely as previously on much higher/more frequent doses. She did attempt suicide previouslyy and we stopped narcotics completely but she had worsening pain which seemed to worsen depression. She had a 2nd suicide attempt after she stopped seeing psychiatry and counseling. We have agreed at this point only to use pain management in ouf office if she continues to see psychiatry and counselor.   She is on vicodin 1 tablet total daily- whether she uses all in the evening or half tablet BID. She feels her pain has been worsening and she is spending most time in bed and even missing church which is very important to her.   A/P:Pain contract 12/31/15. Reviewed [employer] database and has only been getting monthly rx. She has been stable in regards to depression (of note gabapentin now through psychiatry). We will refer her to pain management to see if they can offer additional modalities to help her deal with pain. I did agree if she is not accepted to continue current dose but not escalate dose of vicodin. We reviewed the STOP act requirements and upcoming [place] policy in regards to pain management if done in our office (visit every 3 months just focused on pain management, yearly UDS)

## 2017-01-24 NOTE — Assessment & Plan Note (Signed)
S: appears to be in atrial fibrillation. She is on xarelto and compliant for anticoagulation. She is on flecainide to help maintain her rhythm A/P: Patient tells me cardiology told her to come back only as needed but last note from early 2017 stated follow up in the fall. I do not mind prescribing the flecainide but would want to have her "released" from cardiology with monitoring parameters, reasons to get her plugged back in with cardiology.

## 2017-02-17 IMAGING — CT CT HEAD W/O CM
3 of 6 series · 14 of 47 positions shown, 16 images · non-contrast
Comparison: None.

CLINICAL DATA: Fall.  Slurred speech.

EXAM:
CT HEAD WITHOUT CONTRAST
CT CERVICAL SPINE WITHOUT CONTRAST
TECHNIQUE: Multidetector CT imaging of the head and cervical spine was
performed following the standard protocol without intravenous
contrast. Multiplanar CT image reconstructions of the cervical spine
were also generated.

[Series 7: axial recon · axial · 0.23mm/px · z∈[-232,-106]mm · 8 of 84 slices shown, 10 images]
[im 9/84  brain]
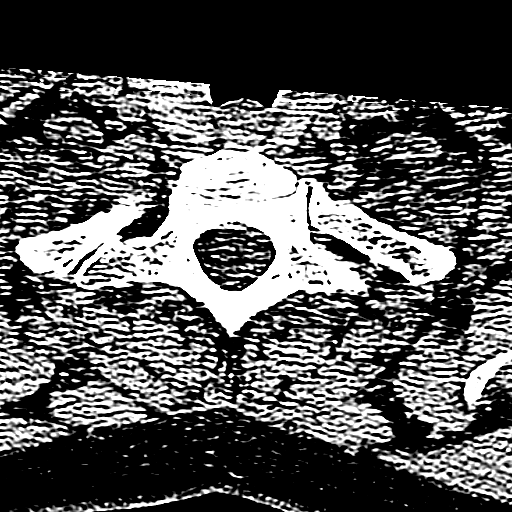
[im 9/84  bone]
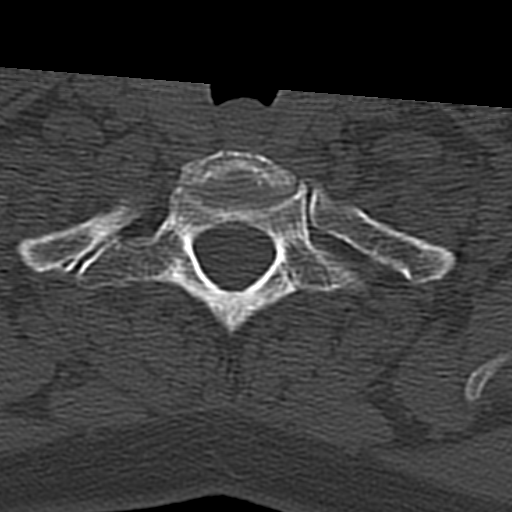
[im 17/84  brain]
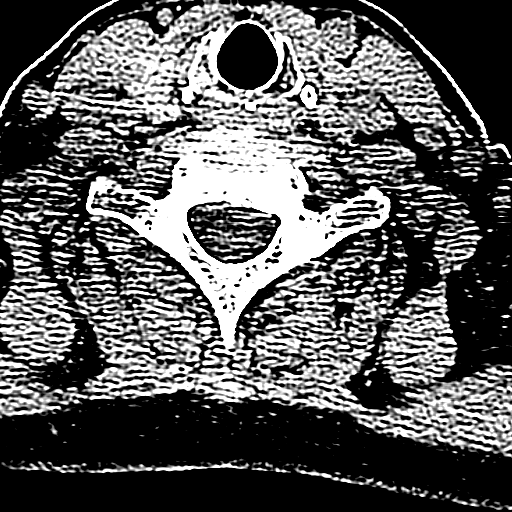
[im 25/84  brain]
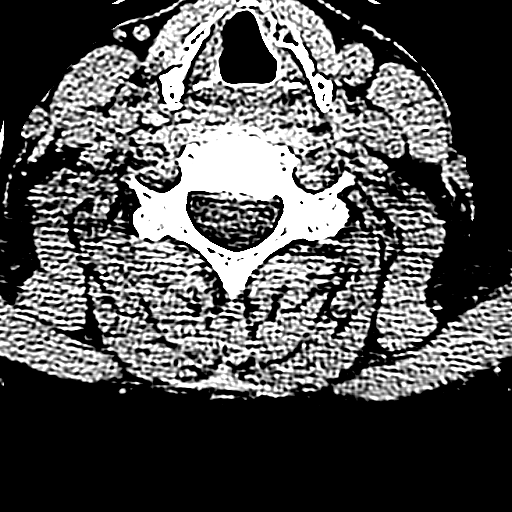
[im 34/84  brain]
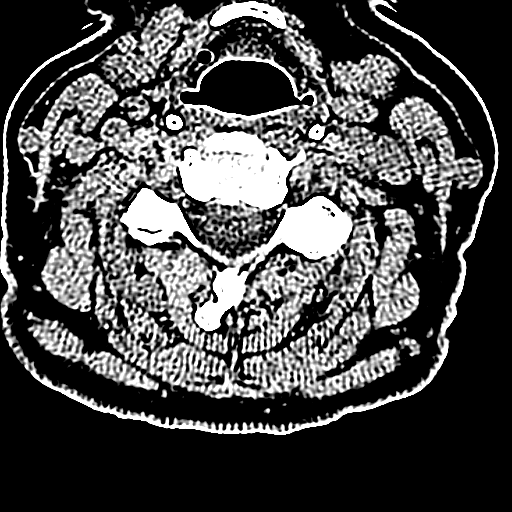
[im 50/84  brain]
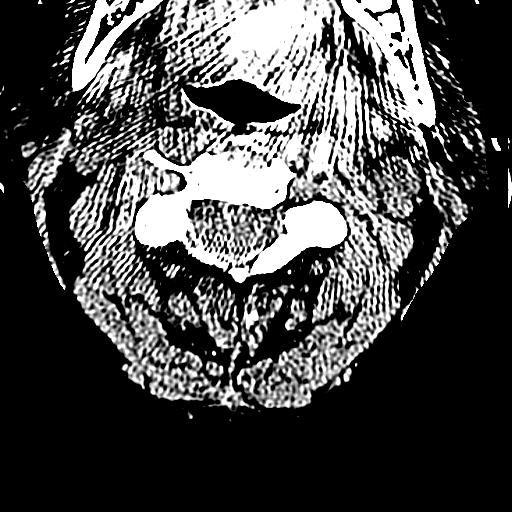
[im 50/84  bone]
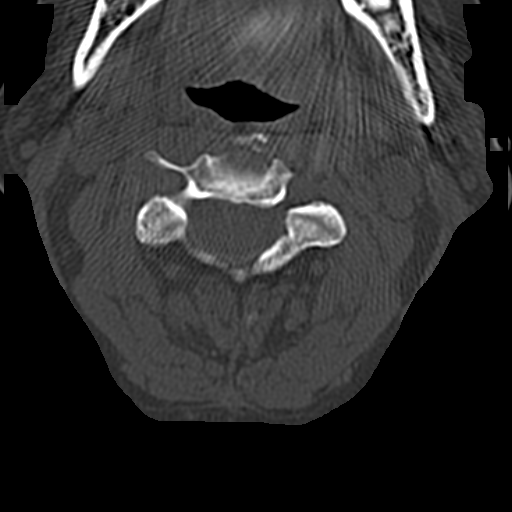
[im 59/84  brain]
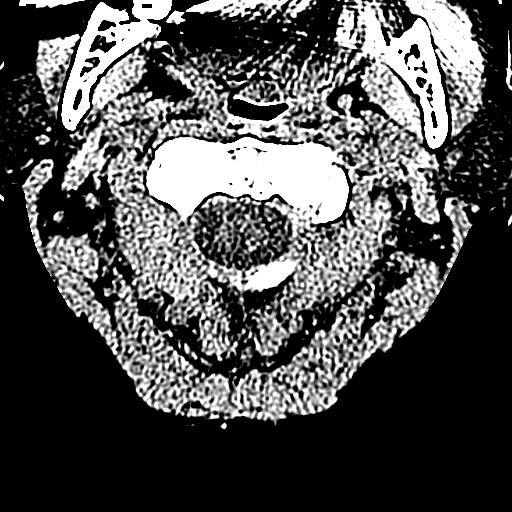
[im 67/84  brain]
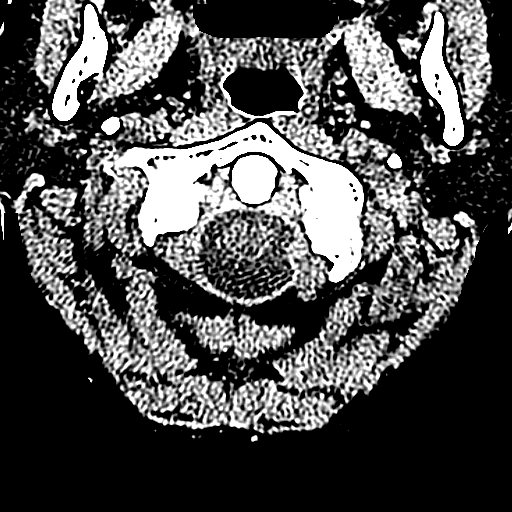
[im 75/84  brain]
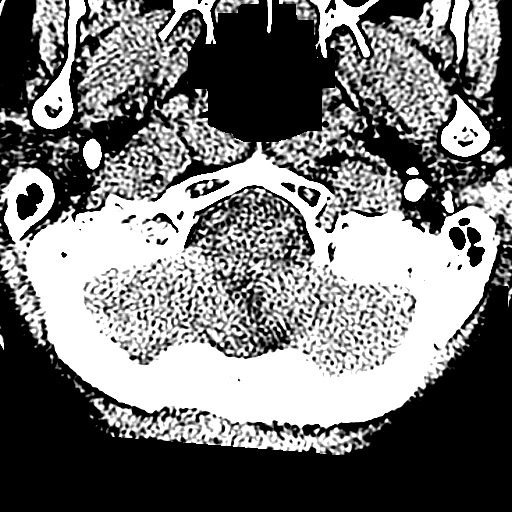

[Series 8: coronal · coronal · 0.23mm/px · 3 of 40 slices shown]
[im 14/40  brain]
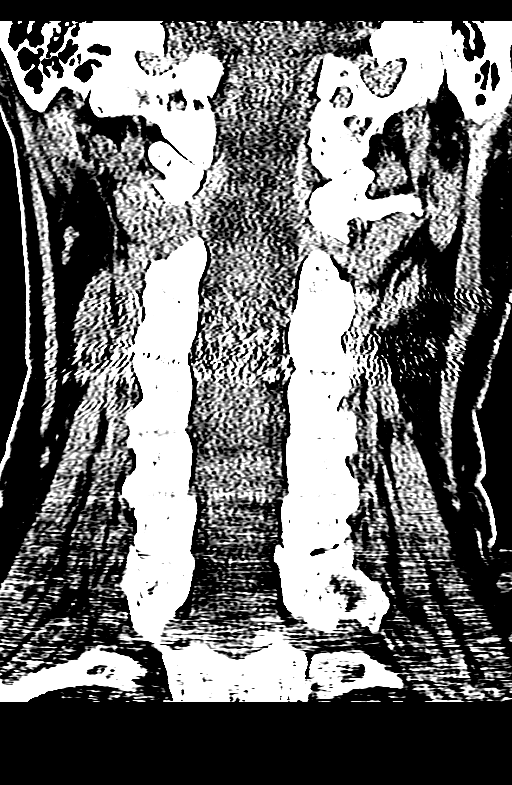
[im 18/40  brain]
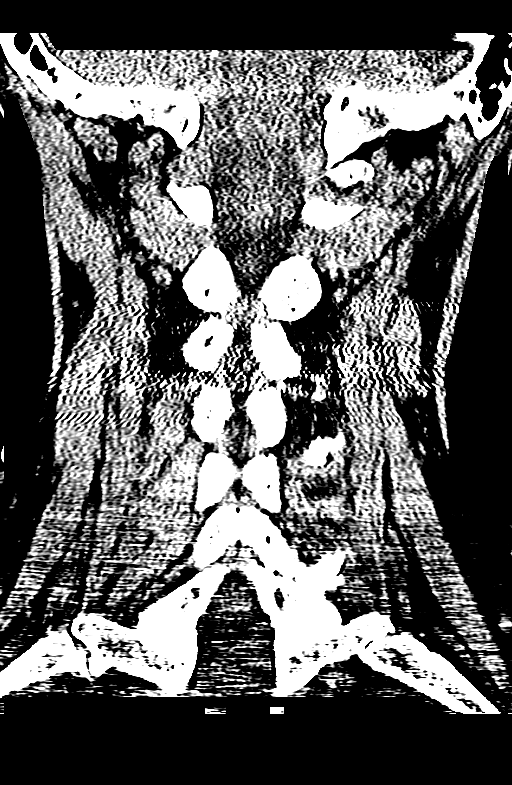
[im 22/40  brain]
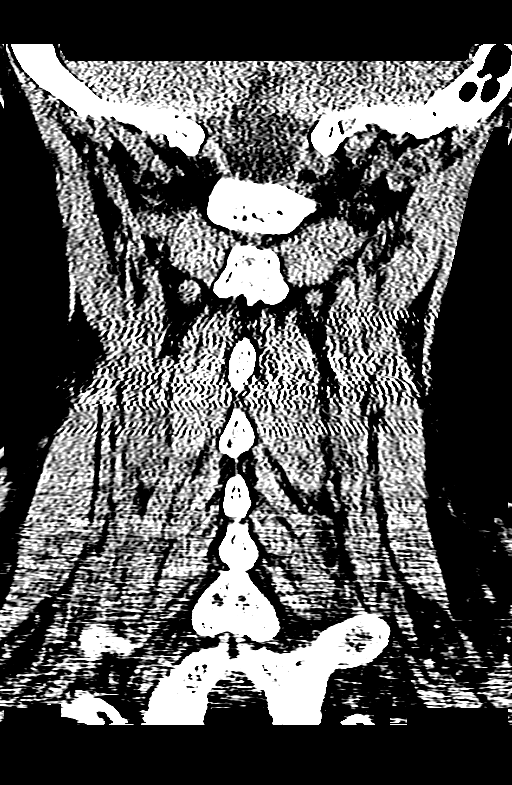

[Series 9: sagittal · sagittal · 0.27mm/px · 3 of 42 slices shown]
[im 14/42  brain]
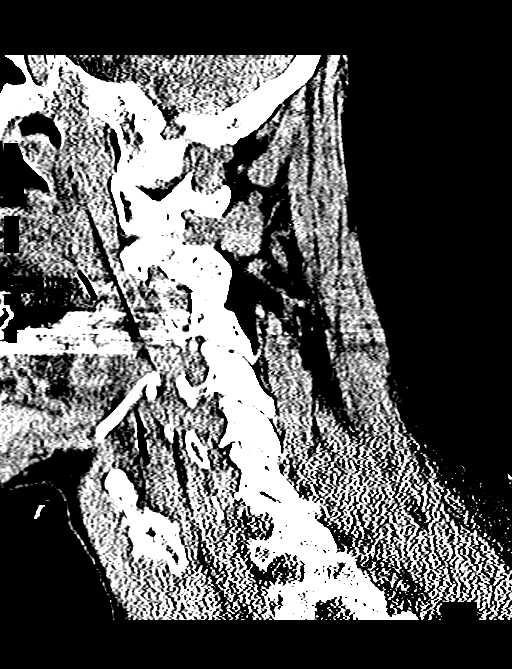
[im 21/42  brain]
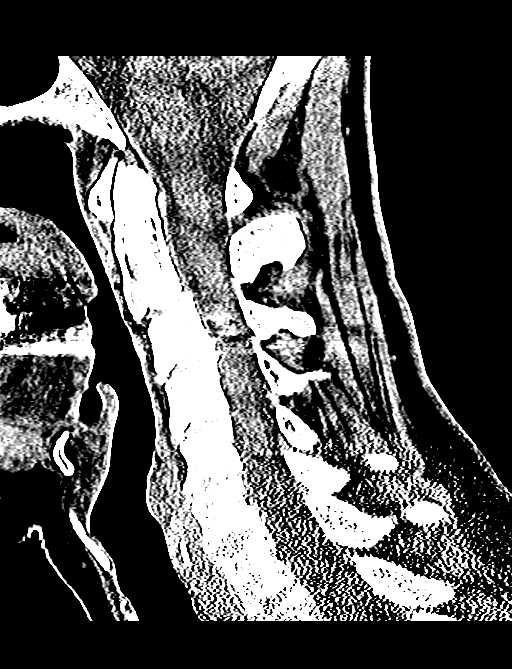
[im 28/42  brain]
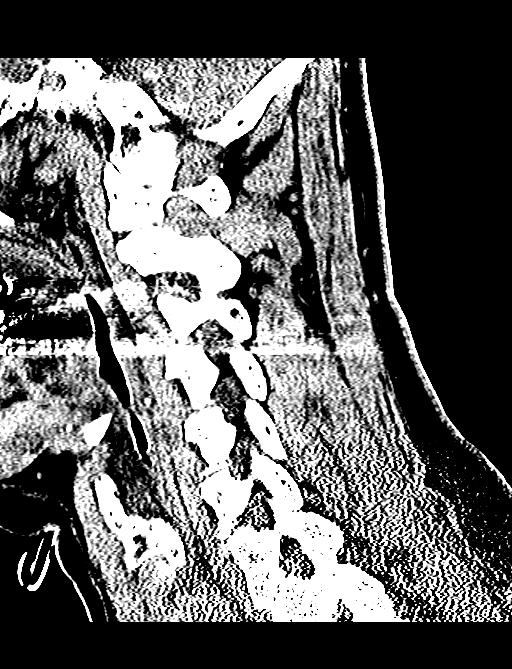

[14 of 47 positions shown; findings below may reference images not displayed]

FINDINGS: CT HEAD FINDINGS

Brain parenchyma, ventricular system, and extra-axial space are
within normal limits. No mass effect, midline shift, or acute
hemorrhage. Cranium is intact.

CT CERVICAL SPINE FINDINGS

No acute fracture. No dislocation. Anatomic alignment. No obvious
soft tissue hematoma or injury. There is left-sided facet
arthropathy at C6-7.
IMPRESSION: No acute intracranial pathology. No evidence of cervical spine
injury.

## 2017-02-17 IMAGING — CR DG CHEST 2V
2 series · 2 of 2 positions shown · non-contrast
Comparison: 09/02/2015

CLINICAL DATA: Ingestion of Ativan, and a number, dosing off and
on, fall, history atrial fibrillation, fibromyalgia, stroke, TIA,
GERD

EXAM:
CHEST  2 VIEW

[x chest ap]
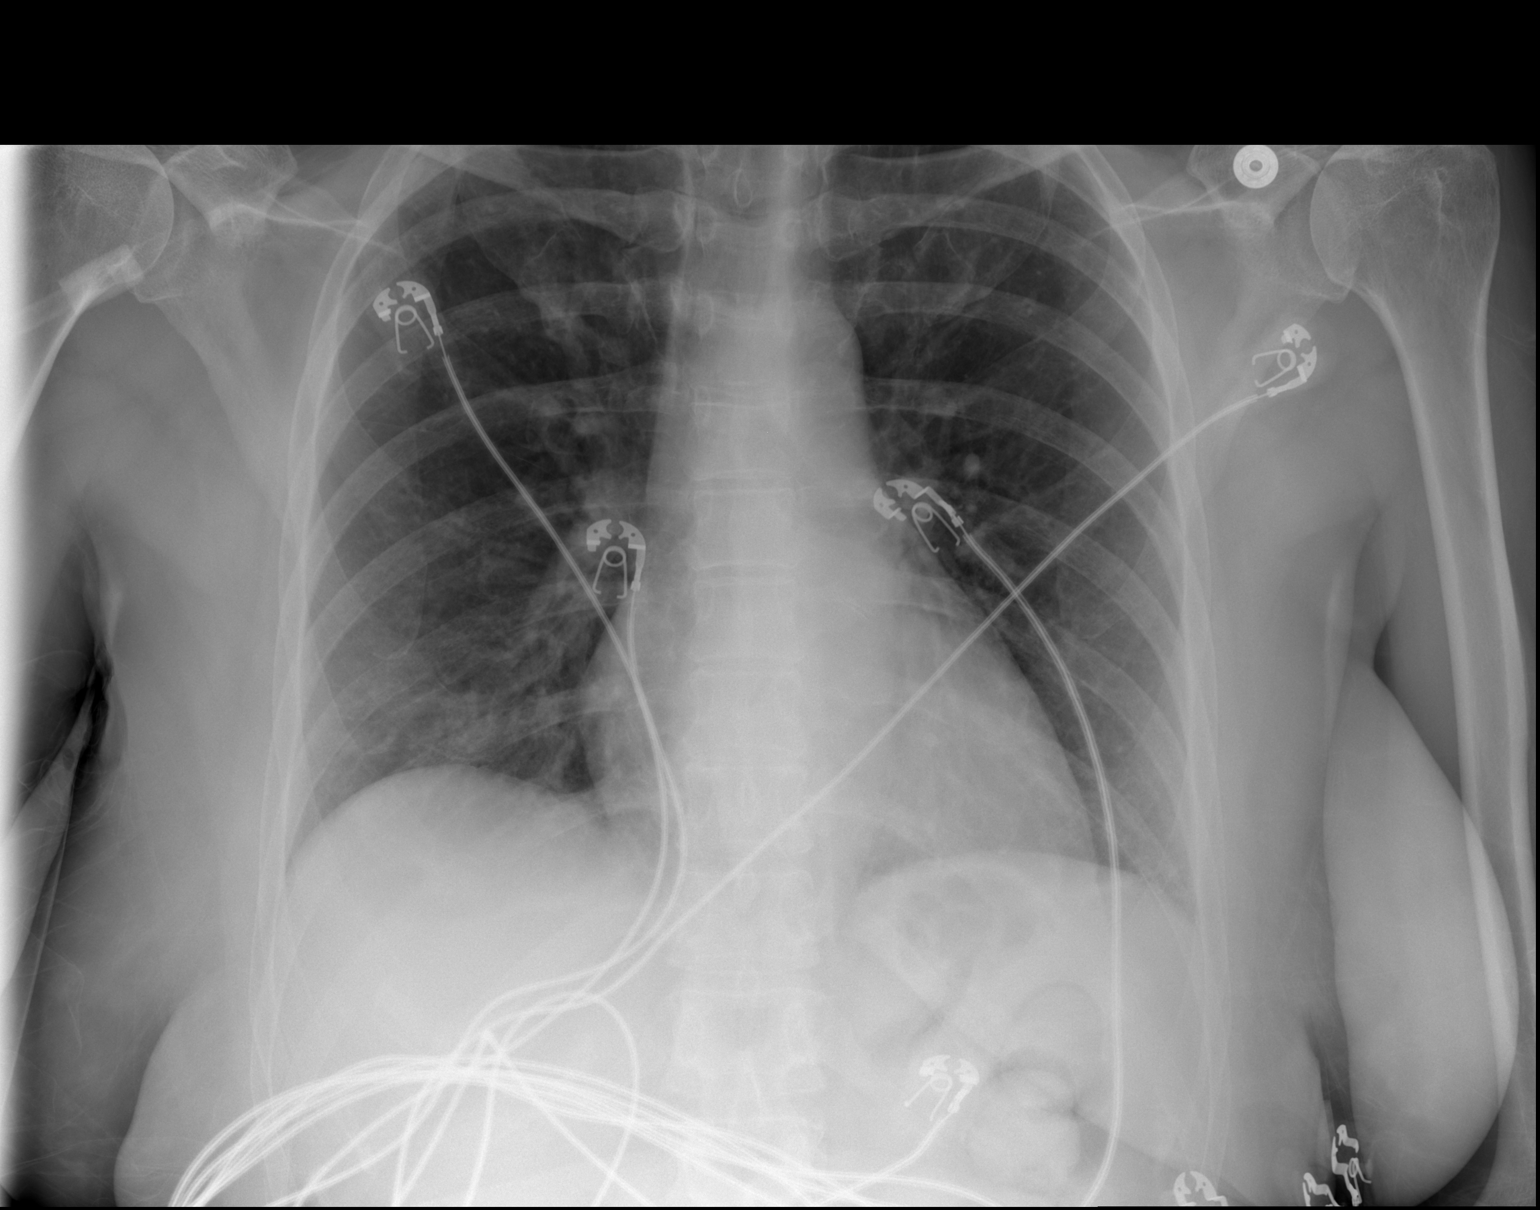

[w chest lat]
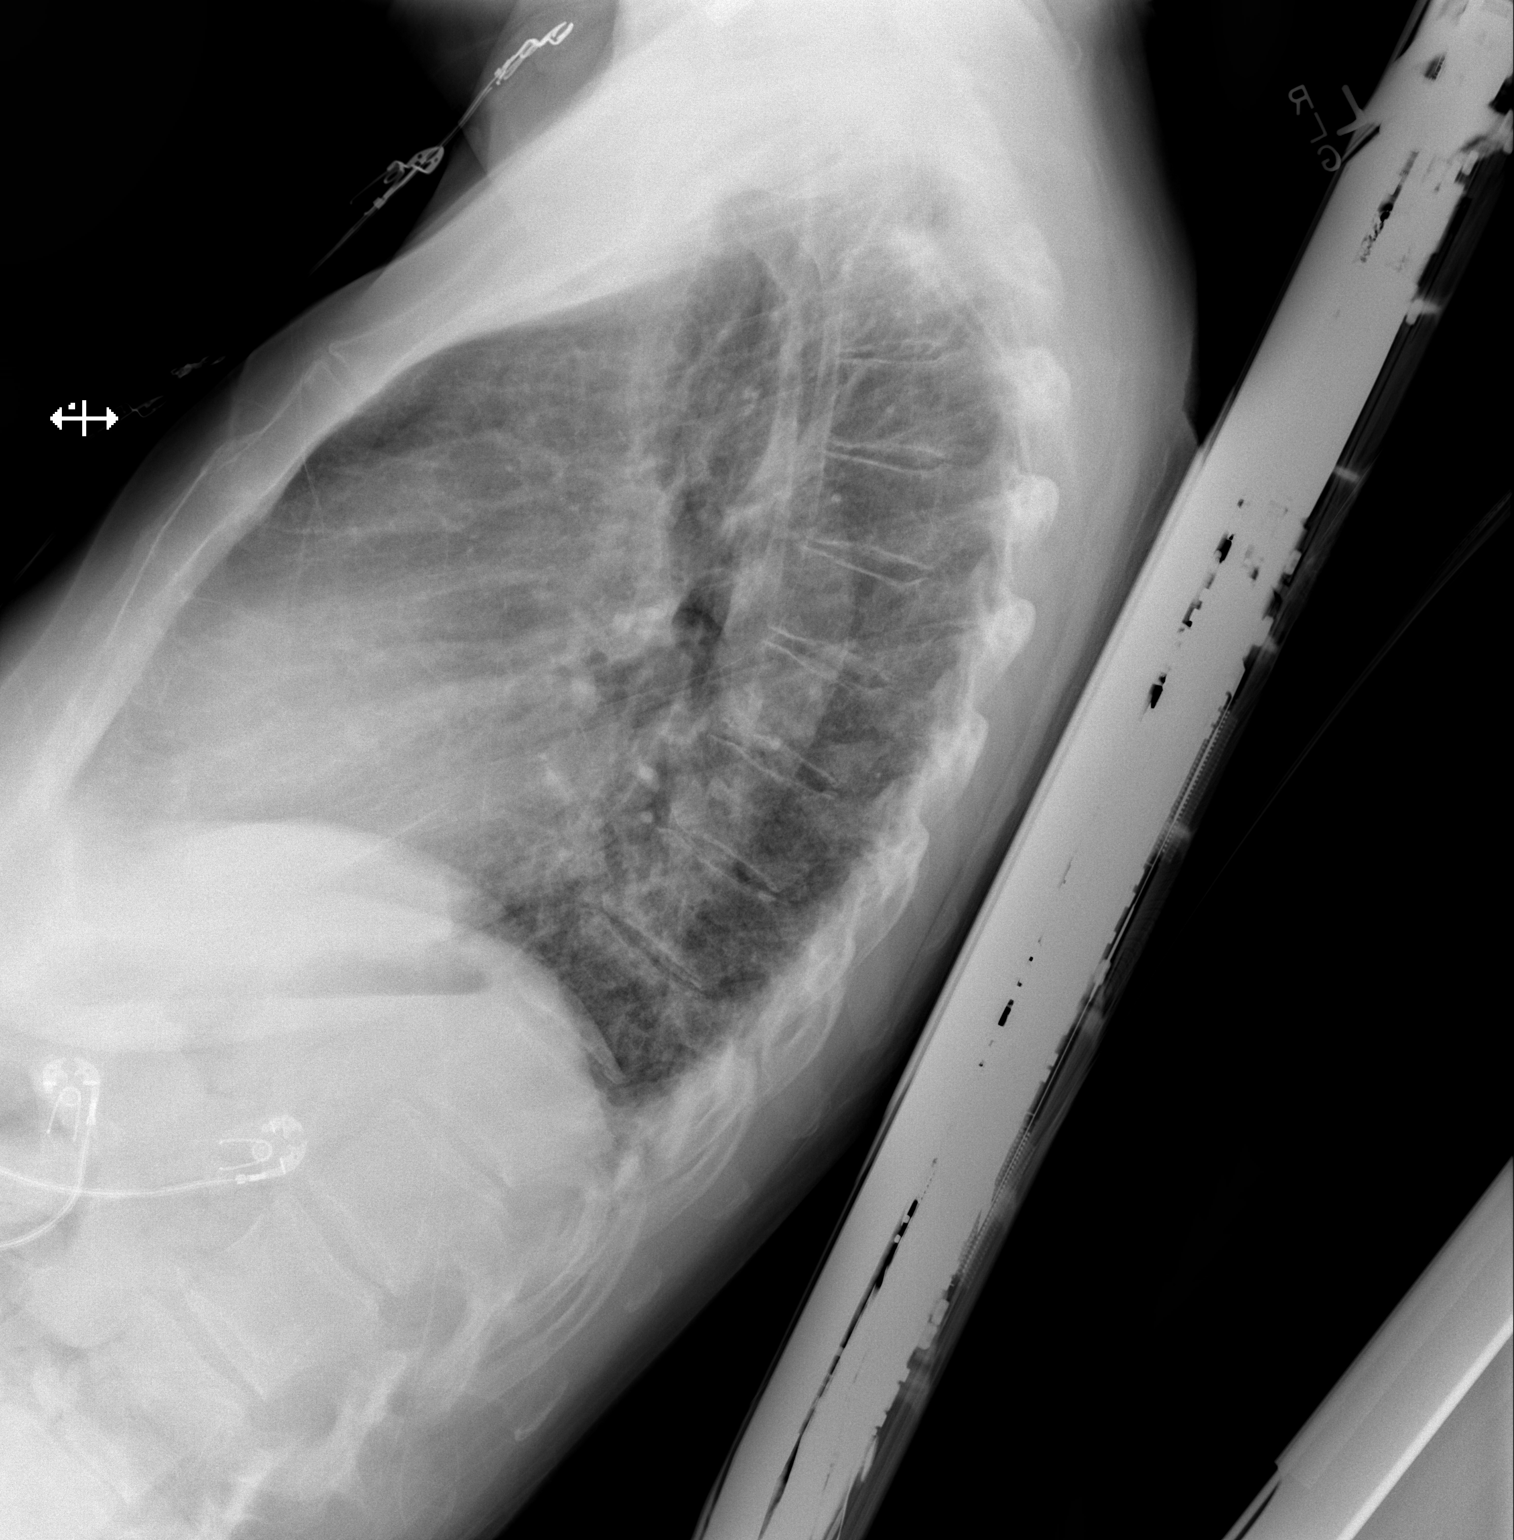

[2 of 2 positions shown; findings below may reference images not displayed]

FINDINGS: Upper normal heart size.

No mediastinal contours and pulmonary vascularity.

Minimal RIGHT basilar atelectasis.

Lungs otherwise clear.

No pleural effusion or pneumothorax.

Bones demineralized.
IMPRESSION: Minimal RIGHT basilar atelectasis.

## 2017-04-09 ENCOUNTER — Encounter: Payer: Self-pay | Admitting: Family Medicine

## 2017-04-23 ENCOUNTER — Other Ambulatory Visit: Payer: Self-pay | Admitting: Family Medicine

## 2017-04-27 ENCOUNTER — Encounter: Payer: Self-pay | Admitting: Family Medicine

## 2017-04-27 ENCOUNTER — Ambulatory Visit (INDEPENDENT_AMBULATORY_CARE_PROVIDER_SITE_OTHER): Payer: BLUE CROSS/BLUE SHIELD | Admitting: Family Medicine

## 2017-04-27 VITALS — BP 96/68 | HR 74 | Temp 97.9°F | Ht 67.0 in | Wt 156.2 lb

## 2017-04-27 DIAGNOSIS — F332 Major depressive disorder, recurrent severe without psychotic features: Secondary | ICD-10-CM

## 2017-04-27 DIAGNOSIS — G894 Chronic pain syndrome: Secondary | ICD-10-CM | POA: Diagnosis not present

## 2017-04-27 DIAGNOSIS — I482 Chronic atrial fibrillation, unspecified: Secondary | ICD-10-CM

## 2017-04-27 DIAGNOSIS — R300 Dysuria: Secondary | ICD-10-CM

## 2017-04-27 DIAGNOSIS — Z79899 Other long term (current) drug therapy: Secondary | ICD-10-CM

## 2017-04-27 DIAGNOSIS — Z23 Encounter for immunization: Secondary | ICD-10-CM

## 2017-04-27 MED ORDER — PREDNISONE 20 MG PO TABS: ORAL_TABLET | ORAL | 0 refills | Status: DC

## 2017-04-27 MED ORDER — HYDROCODONE-ACETAMINOPHEN 5-325 MG PO TABS: 0.5000 | ORAL_TABLET | Freq: Two times a day (BID) | ORAL | 0 refills | Status: DC | PRN

## 2017-04-27 NOTE — Assessment & Plan Note (Signed)
Chronic pain plus right shoulder bursitis/tendinopathy S: continued issues with chronic pain ( on disability). Pain in multiple areas particularly low back and bilateral hip and shoulders R > L , has had flares with knees which injections helped . Pain level at worst up to 9/10 particularly with taking shirt off at night, lowest pain gets is to 5/10. Uses full pill  (if going to be out of the home for a while) or half pill twice a day (most days) of hydrocodone. Also on gabapentin befor ebed.   Still seeing psychiatry every 6 weeks and counseling at least monthly- both requirements for me continuing pain magement. On depakote and trazodone through psychiatry.   A/P: Pain contract 12/31/15. [employer] [place] again today  an last 01/23/17. UDS today (whole pill yesterday)  Refilled hydrocodone.   Right shoulder really bothering her- that is the 9/10 she reports. Prior prednisone helps- wants to retrial but if not better by follow up willing to trial injection- warned her that at now nearly 6 months may want to get her into sports medicine but if I can get decent ROM would trial initial injection.

## 2017-04-27 NOTE — Progress Notes (Signed)
Subjective:  Danielle Rocha is a 66 y.o. year old very pleasant female patient who presents for/with See problem oriented charting ROS- no chest pain. Does have shoulder pain. No shortness of breath. Chronic back pain   Past Medical History-  Patient Active Problem List   Diagnosis Date Noted  . Major depressive disorder, recurrent severe without psychotic features (HCC) 09/02/2016    Priority: High  . Intentional drug overdose (HCC) 11/22/2015    Priority: High  . Atrial fibrillation (HCC)     Priority: High  . Chronic pain syndrome     Priority: High  . GERD (gastroesophageal reflux disease) 01/30/2016    Priority: Low  . UTI (urinary tract infection) 10/07/2016    Medications- reviewed and updated Current Outpatient Prescriptions  Medication Sig Dispense Refill  . acetaminophen (TYLENOL) 500 MG tablet Take 1,000 mg by mouth every 6 (six) hours as needed for mild pain, moderate pain, fever or headache.    Marland Kitchen. atorvastatin (LIPITOR) 20 MG tablet Take 1 tablet (20 mg total) by mouth daily. 90 tablet 2  . flecainide (TAMBOCOR) 100 MG tablet TAKE ONE TABLET BY MOUTH TWICE DAILY 120 tablet 3  . gabapentin (NEURONTIN) 300 MG capsule Take 300 mg by mouth at bedtime.    Marland Kitchen. HYDROcodone-acetaminophen (NORCO/VICODIN) 5-325 MG tablet Take 0.5-1 tablets by mouth 2 (two) times daily as needed for moderate pain or severe pain (may fill today). 30 tablet 0  . Melatonin 3 MG TABS Take 2 tablets by mouth at bedtime.     . Multiple Vitamin (MULTIVITAMIN WITH MINERALS) TABS tablet Take 1 tablet by mouth daily.    Marland Kitchen. omeprazole (PRILOSEC) 20 MG capsule TAKE ONE CAPSULE BY MOUTH TWICE DAILY BEFORE A MEAL 60 capsule 6  . rivaroxaban (XARELTO) 20 MG TABS tablet Take 1 tablet (20 mg total) by mouth daily with supper. 90 tablet 3  . traZODone (DESYREL) 150 MG tablet Take 300 mg by mouth at bedtime.    . divalproex (DEPAKOTE) 500 MG DR tablet Take by mouth.    . predniSONE (DELTASONE) 20 MG tablet Take 2 pills  for 3 days, 1 pill for 4 days 10 tablet 0   No current facility-administered medications for this visit.     Objective: BP 96/68 (BP Location: Left Arm, Patient Position: Sitting, Cuff Size: Large)   Pulse 74   Temp 97.9 F (36.6 C) (Oral)   Ht 5\' 7"  (1.702 m)   Wt 156 lb 3.2 oz (70.9 kg)   SpO2 95%   BMI 24.46 kg/m  Gen: NAD, resting comfortably CV: RRR no murmurs rubs or gallops Lungs: CTAB no crackles, wheeze, rhonchi Ext: no edema Skin: warm, dry MSK: can get arm to 145 degrees and then pain prevents further movement  Assessment/Plan:  Chronic pain syndrome Chronic pain plus right shoulder bursitis/tendinopathy S: continued issues with chronic pain ( on disability). Pain in multiple areas particularly low back and bilateral hip and shoulders R > L , has had flares with knees which injections helped . Pain level at worst up to 9/10 particularly with taking shirt off at night, lowest pain gets is to 5/10. Uses full pill  (if going to be out of the home for a while) or half pill twice a day (most days) of hydrocodone. Also on gabapentin befor ebed.   Still seeing psychiatry every 6 weeks and counseling at least monthly- both requirements for me continuing pain magement. On depakote and trazodone through psychiatry.   A/P: Pain contract 12/31/15.  [place] today  an last 01/23/17. UDS today (whole pill yesterday)  Refilled hydrocodone.   Right shoulder really bothering her- that is the 9/10 she reports. Prior prednisone helps- wants to retrial but if not better by follow up willing to trial injection- warned her that at now nearly 6 months may want to get her into sports medicine but if I can get decent ROM would trial initial injection.   Atrial fibrillation (HCC) Compliant with Xarelto anticoagulation. Flecainide antiarrythmic. She agrees to call cardiology for follow up.   Major depressive disorder, recurrent severe without psychotic features (HCC) No Si. Depressed  mood still present- has not been able to find son Thayer Ohm - missing person in [place] since late 2017 . Has to comfort son Jeannett Senior who has down's who often cries about missing him- really hard on her and family. Husband doing ok but seems to have accepted the most that Thayer Ohm is dead at this point.   Continue psychiatry follow up   Return in about 3 months (around 07/28/2017) for follow up- or sooner if needed.  Orders Placed This Encounter  Procedures  . Drug Abuse Panel 10-50, U    Meds ordered this encounter  Medications  . traZODone (DESYREL) 150 MG tablet    Sig: Take 300 mg by mouth at bedtime.  . predniSONE (DELTASONE) 20 MG tablet    Sig: Take 2 pills for 3 days, 1 pill for 4 days    Dispense:  10 tablet    Refill:  0    Return precautions advised.  Tana Conch, MD

## 2017-04-27 NOTE — Assessment & Plan Note (Addendum)
No Si. Depressed mood still present- has not been able to find son Danielle Rocha - missing person in [place] since late 2017 . Has to comfort son Danielle Rocha who has down's who often cries about missing him- really hard on her and family. Husband doing ok but seems to have accepted the most that Danielle Rocha is dead at this point.   Continue psychiatry follow up

## 2017-04-27 NOTE — Addendum Note (Signed)
Addended by: Rivka SaferSOUTHERN HIZER, Asher MuirJAMIE M on: 04/27/2017 02:18 PM   Modules accepted: Orders

## 2017-04-27 NOTE — Patient Instructions (Addendum)
Please call cardiology for follow up  Trial prednisone agree- you wanted to proceed with injection if not improved by follow up  Prevnar 13 recommended today  Refilled meds  Please stop by lab before you go

## 2017-04-27 NOTE — Assessment & Plan Note (Signed)
Compliant with Xarelto anticoagulation. Flecainide antiarrythmic. She agrees to call cardiology for follow up.

## 2017-04-28 LAB — DRUG ABUSE PANEL 10-50, U
AMPHETAMINES (1000 NG/ML SCRN): NEGATIVE
BARBITURATES: NEGATIVE
BENZODIAZEPINES: NEGATIVE
COCAINE METABOLITES: NEGATIVE
MARIJUANA MET (50 ng/mL SCRN): NEGATIVE
METHADONE: NEGATIVE
METHAQUALONE: NEGATIVE
OPIATES: NEGATIVE
PHENCYCLIDINE: NEGATIVE
PROPOXYPHENE: NEGATIVE

## 2017-04-30 LAB — URINE CULTURE

## 2017-05-01 ENCOUNTER — Other Ambulatory Visit: Payer: Self-pay

## 2017-05-01 MED ORDER — CEPHALEXIN 500 MG PO CAPS: 500.0000 mg | ORAL_CAPSULE | Freq: Three times a day (TID) | ORAL | 0 refills | Status: DC

## 2017-05-23 ENCOUNTER — Other Ambulatory Visit: Payer: Self-pay | Admitting: Family Medicine

## 2017-07-22 ENCOUNTER — Other Ambulatory Visit: Payer: Self-pay | Admitting: Family Medicine

## 2017-07-29 ENCOUNTER — Encounter: Payer: Self-pay | Admitting: Family Medicine

## 2017-07-29 ENCOUNTER — Ambulatory Visit (INDEPENDENT_AMBULATORY_CARE_PROVIDER_SITE_OTHER): Payer: BLUE CROSS/BLUE SHIELD | Admitting: Family Medicine

## 2017-07-29 VITALS — BP 114/80 | HR 75 | Temp 98.1°F | Ht 67.0 in | Wt 157.8 lb

## 2017-07-29 DIAGNOSIS — F332 Major depressive disorder, recurrent severe without psychotic features: Secondary | ICD-10-CM

## 2017-07-29 DIAGNOSIS — I482 Chronic atrial fibrillation, unspecified: Secondary | ICD-10-CM

## 2017-07-29 DIAGNOSIS — G894 Chronic pain syndrome: Secondary | ICD-10-CM

## 2017-07-29 DIAGNOSIS — Z23 Encounter for immunization: Secondary | ICD-10-CM

## 2017-07-29 DIAGNOSIS — R3915 Urgency of urination: Secondary | ICD-10-CM | POA: Diagnosis not present

## 2017-07-29 MED ORDER — HYDROCODONE-ACETAMINOPHEN 5-325 MG PO TABS: 0.5000 | ORAL_TABLET | Freq: Two times a day (BID) | ORAL | 0 refills | Status: DC | PRN

## 2017-07-29 NOTE — Assessment & Plan Note (Addendum)
S: Depression remains controlled- recurrent severe in remission. She has a history of overdose- last visit seeing psychiatry every 6 weeks and counseling monthly- wasn't the best fit with current counselor- with her history we have discussed continuing this if I am going to continue her pain medication. Compliant with depakote and trazodone 150mg  A/P: continue current medications, psychiatry follow up. Handout given for our behavioral health team and can also discuss with psychiatrist other counselor options. Luckily no SI

## 2017-07-29 NOTE — Progress Notes (Signed)
Subjective:  Danielle Rocha is a 66 y.o. year old very pleasant female patient who presents for/with See problem oriented charting ROS- reports chronic pain- shoulders, knees, hips. No fever or hills. No SI.    Past Medical History-  Patient Active Problem List   Diagnosis Date Noted  . Major depressive disorder, recurrent severe without psychotic features (HCC) 09/02/2016    Priority: High  . Intentional drug overdose (HCC) 11/22/2015    Priority: High  . Atrial fibrillation (HCC)     Priority: High  . Chronic pain syndrome     Priority: High  . GERD (gastroesophageal reflux disease) 01/30/2016    Priority: Low  . UTI (urinary tract infection) 10/07/2016    Medications- reviewed and updated Current Outpatient Medications  Medication Sig Dispense Refill  . acetaminophen (TYLENOL) 500 MG tablet Take 1,000 mg by mouth every 6 (six) hours as needed for mild pain, moderate pain, fever or headache.    Marland Kitchen. atorvastatin (LIPITOR) 20 MG tablet Take 1 tablet (20 mg total) by mouth daily. 90 tablet 2  . flecainide (TAMBOCOR) 100 MG tablet TAKE ONE TABLET BY MOUTH TWICE DAILY 120 tablet 3  . gabapentin (NEURONTIN) 300 MG capsule Take 300 mg by mouth at bedtime.    Marland Kitchen. HYDROcodone-acetaminophen (NORCO/VICODIN) 5-325 MG tablet Take 0.5-1 tablets by mouth 2 (two) times daily as needed for moderate pain or severe pain (may fill today). 30 tablet 0  . HYDROcodone-acetaminophen (NORCO/VICODIN) 5-325 MG tablet Take 0.5-1 tablets by mouth 2 (two) times daily as needed for moderate pain or severe pain (may fill in 1 month). 30 tablet 0  . HYDROcodone-acetaminophen (NORCO/VICODIN) 5-325 MG tablet Take 0.5-1 tablets by mouth 2 (two) times daily as needed for moderate pain or severe pain (may fill in 2 months). 30 tablet 0  . Melatonin 3 MG TABS Take 2 tablets by mouth at bedtime.     . Multiple Vitamin (MULTIVITAMIN WITH MINERALS) TABS tablet Take 1 tablet by mouth daily.    Marland Kitchen. omeprazole (PRILOSEC) 20 MG  capsule TAKE ONE CAPSULE BY MOUTH TWICE DAILY BEFORE A MEAL 60 capsule 6  . rivaroxaban (XARELTO) 20 MG TABS tablet Take 1 tablet (20 mg total) by mouth daily with supper. 90 tablet 3  . traZODone (DESYREL) 150 MG tablet Take 300 mg by mouth at bedtime.    . divalproex (DEPAKOTE) 500 MG DR tablet Take by mouth.     No current facility-administered medications for this visit.     Objective: BP 114/80 (BP Location: Left Arm, Patient Position: Sitting, Cuff Size: Normal)   Pulse 75   Temp 98.1 F (36.7 C) (Oral)   Ht 5\' 7"  (1.702 m)   Wt 157 lb 12.8 oz (71.6 kg)   SpO2 97%   BMI 24.71 kg/m  Gen: NAD, resting comfortably- reports high levels of pain CV: RRR no murmurs rubs or gallops Lungs: CTAB no crackles, wheeze, rhonchi Abdomen: soft/nontender/nondistended/normal bowel sounds. No rebound or guarding.  Ext: no edema Skin: warm, dry  Assessment/Plan:  Urinary urgency, smell for a month- urine culture with UTi last vist  Major depressive disorder, recurrent severe without psychotic features (HCC) S: Depression remains controlled- recurrent severe in remission. She has a history of overdose- last visit seeing psychiatry every 6 weeks and counseling monthly- wasn't the best fit with current counselor- with her history we have discussed continuing this if I am going to continue her pain medication. Compliant with depakote and trazodone 150mg  A/P: continue current medications, psychiatry follow  up. Handout given for our behavioral health team and can also discuss with psychiatrist other counselor options. Luckily no SI  Chronic pain syndrome S:  Patient on disability for chronic pain. Has had extensive orthopedic workup in the past  And even had low back surgery without improvement.  Deals with chronic low back pain, bilateral hip, as well as shoulders- right side seems to be worse. Knees have bothered her before and injections hurt. Knee injections did not help pain in other areas.    Patient using hydrocodone half pill twice a day- on some bad days will use full pill if going to be at home all day (but only has enough for one pill a day)  Her shoulder pain on right was 9/10 at last visit and we did trial prednisone- declined injection at that time.   Today pain up to 7-8/10 no medicine yesterday. Lowest pain she experiences is 4-5/10.  A/P:  Refills provided for chronic pain. HYdrocodone 5/325 #30 per month x 3 months -Pain contract on file 12/31/15 -UDS 04/27/17 -[place]CCS[place] reviewed today -gave handout [facility] -see depression section discussion  Atrial fibrillation (HCC) S: compliant with Xarelto anticoagulation. Flecainide antiarrythmic  A/P: has not followed up with cardiology. I am ok prescribing the xarelto long term but typically do not want flecainide so want their opinion. Appears to be in sinus at least today  3 month CPE  Orders Placed This Encounter  Procedures  . Urine Culture    [facility]  . Flu vaccine HIGH DOSE PF  . Ambulatory referral to Cardiology    Referral Priority:   Routine    Referral Type:   Consultation    Referral Reason:   Specialty Services Required    Referred to Provider:   Pricilla Riffleoss, Paula V, MD    Requested Specialty:   Cardiology    Number of Visits Requested:   1    Meds ordered this encounter  Medications  . HYDROcodone-acetaminophen (NORCO/VICODIN) 5-325 MG tablet    Sig: Take 0.5-1 tablets 2 (two) times daily as needed by mouth for moderate pain or severe pain (may fill today).    Dispense:  30 tablet    Refill:  0  . HYDROcodone-acetaminophen (NORCO/VICODIN) 5-325 MG tablet    Sig: Take 0.5-1 tablets 2 (two) times daily as needed by mouth for moderate pain or severe pain (may fill in 2 months).    Dispense:  30 tablet    Refill:  0  . HYDROcodone-acetaminophen (NORCO/VICODIN) 5-325 MG tablet    Sig: Take 0.5-1 tablets 2 (two) times daily as needed by mouth for moderate pain or severe pain (may fill in 1  month).    Dispense:  30 tablet    Refill:  0    Return precautions advised.  Tana ConchStephen Hunter, MD

## 2017-07-29 NOTE — Patient Instructions (Addendum)
Drop off urine before you go. Should have results by Friday or Monday- would treat UTI if present (over 100,000 colonies of bacteria).   Can only provide refills of hydrocodone in person- go ahead and schedule 3 month follow up. Could do this as a physical as definitely need to update labs

## 2017-07-29 NOTE — Assessment & Plan Note (Signed)
S:  Patient on disability for chronic pain. Has had extensive orthopedic workup in the past  And even had low back surgery without improvement.  Deals with chronic low back pain, bilateral hip, as well as shoulders- right side seems to be worse. Knees have bothered her before and injections hurt. Knee injections did not help pain in other areas.   Patient using hydrocodone half pill twice a day- on some bad days will use full pill if going to be at home all day (but only has enough for one pill a day)  Her shoulder pain on right was 9/10 at last visit and we did trial prednisone- declined injection at that time.   Today pain up to 7-8/10 no medicine yesterday. Lowest pain she experiences is 4-5/10.  A/P:  Refills provided for chronic pain. HYdrocodone 5/325 #30 per month x 3 months -Pain contract on file 12/31/15 -UDS 04/27/17 -NCCS[place] reviewed today -gave handout [facility] -see depression section discussion

## 2017-07-29 NOTE — Assessment & Plan Note (Signed)
S: compliant with Xarelto anticoagulation. Flecainide antiarrythmic  A/P: has not followed up with cardiology. I am ok prescribing the xarelto long term but typically do not want flecainide so want their opinion. Appears to be in sinus at least today

## 2017-08-01 LAB — URINE CULTURE
MICRO NUMBER: 81252786
SPECIMEN QUALITY: ADEQUATE

## 2017-08-02 ENCOUNTER — Other Ambulatory Visit: Payer: Self-pay | Admitting: Family Medicine

## 2017-08-02 MED ORDER — CEPHALEXIN 500 MG PO CAPS: 500.0000 mg | ORAL_CAPSULE | Freq: Three times a day (TID) | ORAL | 0 refills | Status: AC

## 2017-08-17 ENCOUNTER — Encounter: Payer: Self-pay | Admitting: Family Medicine

## 2017-09-22 ENCOUNTER — Other Ambulatory Visit: Payer: Self-pay | Admitting: Family Medicine

## 2017-09-24 ENCOUNTER — Encounter: Payer: Self-pay | Admitting: Family Medicine

## 2017-09-30 ENCOUNTER — Encounter: Payer: Self-pay | Admitting: Family Medicine

## 2017-10-01 ENCOUNTER — Other Ambulatory Visit: Payer: Self-pay

## 2017-10-01 DIAGNOSIS — M25511 Pain in right shoulder: Secondary | ICD-10-CM

## 2017-10-01 DIAGNOSIS — G8929 Other chronic pain: Secondary | ICD-10-CM

## 2017-10-01 DIAGNOSIS — M25551 Pain in right hip: Secondary | ICD-10-CM

## 2017-10-01 DIAGNOSIS — G894 Chronic pain syndrome: Secondary | ICD-10-CM

## 2017-10-01 DIAGNOSIS — M25552 Pain in left hip: Secondary | ICD-10-CM

## 2017-10-01 DIAGNOSIS — M25512 Pain in left shoulder: Secondary | ICD-10-CM

## 2017-10-01 DIAGNOSIS — M549 Dorsalgia, unspecified: Secondary | ICD-10-CM

## 2017-10-14 ENCOUNTER — Ambulatory Visit (INDEPENDENT_AMBULATORY_CARE_PROVIDER_SITE_OTHER): Payer: BLUE CROSS/BLUE SHIELD | Admitting: Physical Therapy

## 2017-10-14 ENCOUNTER — Encounter: Payer: Self-pay | Admitting: Physical Therapy

## 2017-10-14 DIAGNOSIS — M25552 Pain in left hip: Secondary | ICD-10-CM | POA: Diagnosis not present

## 2017-10-14 DIAGNOSIS — M25551 Pain in right hip: Secondary | ICD-10-CM

## 2017-10-14 DIAGNOSIS — R2689 Other abnormalities of gait and mobility: Secondary | ICD-10-CM | POA: Diagnosis not present

## 2017-10-14 NOTE — Therapy (Signed)
[facility] [address] [place], [place], [id number] Phone: [phone]   Fax:  [phone]  Physical Therapy Evaluation  Patient Details  Name: Danielle Rocha MRN: [id number] Date of Birth: 03/30/1951 Referring Provider: Durene CalHunter   Encounter Date: 10/14/2017  PT End of Session - 10/14/17 1310    Visit Number  1    Number of Visits  12    Date for PT Re-Evaluation  11/25/17    Authorization Type  BCBS/ Medicare $20    PT Start Time  1215    PT Stop Time  1305    PT Time Calculation (min)  50 min    Activity Tolerance  Patient tolerated treatment well    Behavior During Therapy  [facility] for tasks assessed/performed       Past Medical History:  Diagnosis Date  . Arthritis    DJD, low back, thumb  . Atrial fibrillation (HCC)    Xarelto anticoagulation. Flecainide antiarrythmic   . Chicken pox   . Chronic pain syndrome    On disability. History of bilateral hip pain, low back pain. Gabapentin 400 BID, percocet 1 tablet daily per prior provider.   . Colon polyp    awaiting records  . Depression    zoloft 100mg , remeron 30mg  per psychiatry. ambien 10mg  per psychiatry to help wtih sleep element.   . Dystonia    described as psychogenic dystonia. Pain and twisting from upper chest and up ith triggers "wind, creamy food" on TID ativan per psychiatry previously.   . Fibromyalgia   . GERD (gastroesophageal reflux disease)    omeprazole OTC  . Goiter    states multiple imaging tests, has had biopsies  . Hyperlipidemia    lovastatin 20mg   . Stroke (HCC)    TIA (left side of face and body decreased sensitivity than right face and side)  . TIA (transient ischemic attack)   . Vaginal atrophy    estrace vaginal cream    Past Surgical History:  Procedure Laterality Date  . ATRIAL FIBRILLATION ABLATION    . BIOPSY THYROID    . fusion l4-l5  2000  . LAMINECTOMY     L4-L5  . OTHER SURGICAL HISTORY     tennis elbow surgery  .  piriformis release  1999  . TONSILLECTOMY AND ADENOIDECTOMY  age 36  . VAGINAL HYSTERECTOMY  1993    There were no vitals filed for this visit.   Subjective Assessment - 10/14/17 1219    Subjective  Pt states 20 year history of pain in B hips and B shoulders, which she has not had any improvements for, but has tried different things including PT. She also has chronic back pain, did have surgery for stenosis, which she reports no relief from. She reports that she has been falling more often, and is having difficulty getting back up. She has fallen several times in last year, she states due to tripping over feet or just loosing balance, denies any dizziness that effects this. Pt wishes to focus primarily on hip pain and LE strength, with secondary focus on balance during activity.     Patient is accompained by:  Family member    Limitations  Standing;Walking;House hold activities    Patient Stated Goals  Decreased pain, improved strength, decrease falls     Currently in Pain?  Yes    Pain Score  5     Pain Location  Hip    Pain Orientation  Right;Left  Pain Descriptors / Indicators  Tender;Sore;Aching    Pain Onset  More than a month ago    Pain Frequency  Constant    Aggravating Factors   activity, standing, walking,     Pain Relieving Factors  Laying down         [facility] PT Assessment - 10/14/17 0001      Assessment   Medical Diagnosis  Chronic pain, B hips, Difficulty walking/falls     Referring Provider  Hunter    Hand Dominance  Right      Precautions   Precautions  None      Balance Screen   Has the patient fallen in the past 6 months  Yes    How many times?  3    Has the patient had a decrease in activity level because of a fear of falling?   No      Home Public house manager residence      Prior Function   Level of Independence  Independent      Cognition   Overall Cognitive Status  Within Functional Limits for tasks assessed      Functional  Tests   Functional tests  -- Toe taps: fair(inability to consistently clear step);       ROM / Strength   AROM / PROM / Strength  AROM;Strength      AROM   Overall AROM Comments  Hips: mild limitation for flex, moderate for IR/ER Bil;  Lumbar mild limitations, pain with side bending and extension    AROM Assessment Site  Hip;Lumbar      Strength   Overall Strength Comments  Hips: 3+/5      Flexibility   Soft Tissue Assessment /Muscle Length  yes    Hamstrings  Limited      Palpation   Palpation comment  Sorness at Bil Greater trochanter, tenderness to light touch at hip and ITB region      Special Tests   Other special tests  Pearlean Brownie: soreness in Bil hips, Neg SLR, Neg hip scour      Ambulation/Gait   Ambulation/Gait  Yes    Ambulation/Gait Assistance  7: Independent    Ambulation Distance (Feet)  100 Feet    Gait Pattern  -- Decreased speed, step height, stance width.    Gait Comments  Static balance: good;  Standing Dynamic Balance: Fair             Objective measurements completed on examination: See above findings.      [facility] Adult PT Treatment/Exercise - 10/14/17 0001      Exercises   Exercises  Knee/Hip      Knee/Hip Exercises: Stretches   Active Hamstring Stretch  30 seconds;3 reps    Active Hamstring Stretch Limitations  seated    Piriformis Stretch  3 reps;30 seconds    Piriformis Stretch Limitations  mod fig 4 pos/supine    Other Knee/Hip Stretches  SKTC 30 sec x3; Hip ER clam stretch /supine 10 sec x 5             PT Education - 10/14/17 1310    Education provided  Yes    Education Details  Initial HEP, home safety for falls     Person(s) Educated  Patient    Methods  Explanation;Demonstration;Handout    Comprehension  Verbalized understanding;Verbal cues required       PT Short Term Goals - 10/14/17 1313      PT SHORT  TERM GOAL #1   Title  Pt to be independent with initial HEP    Time  2    Period  Weeks    Status  New    Target  Date  10/28/17      PT SHORT TERM GOAL #2   Title  Pt to report decreased pain in B hips to 4/10 with activity     Time  2    Period  Weeks    Status  New    Target Date  10/28/17        PT Long Term Goals - 10/14/17 1313      PT LONG TERM GOAL #1   Title  Pt to demo Bil Hip ROM to be WNL, to improve ability for squatting and stooping    Time  6    Period  Weeks    Status  New    Target Date  11/25/17      PT LONG TERM GOAL #2   Title  Pt to demo increased LE strength to at least 4+/5 to improve gait, balance, and ability to transfer from floor to standing position.     Time  6    Period  Weeks    Status  New    Target Date  11/25/17      PT LONG TERM GOAL #3   Title  Pt to demo ability for safe ambulation and stair navigation, with balance WFL,  to decrease risk for falls     Time  6    Period  Weeks    Status  New    Target Date  11/25/17             Plan - 10/14/17 1312    Clinical Impression Statement  Clinical Impression Statement   Pt presents with multiple pain complaints/locations, but most focus on Bil hips, LE strength and falls. Pt with soreness in Bil hips, at greater trochanter and ITB region. She has limited ROM and poor strength Bilaterally. She has lack of effective HEP, and has had pain for several years. She also has decreased balance and stability with standing, dynamic balance, and has had several falls in last year. She has poor ability for foot clearance with step ups and toe taps, as well as ambulation today. Pt with deficits that are decreasing safety, and ability for functional activities, as well as ADLs and IADLS. Pto benefit from skilled physical therapy to address above deficits and return to PLOF without pain. Prognosis for recovery is good. Home exercise program issued today.    Clinical Presentation  Stable    Clinical Decision Making  Low    Rehab Potential  Good    PT Frequency  2x / week    PT Duration  6 weeks    PT  Treatment/Interventions  ADLs/Self Care Home Management;Cryotherapy;Electrical Stimulation;Moist Heat;Therapeutic exercise;Therapeutic activities;Functional mobility training;Stair training;Gait training;Ultrasound;Balance training;Neuromuscular re-education;Patient/family education;Manual techniques;Taping;Dry needling;Passive range of motion    Consulted and Agree with Plan of Care  Patient       Patient will benefit from skilled therapeutic intervention in order to improve the following deficits and impairments:  Abnormal gait, Decreased endurance, Hypomobility, Decreased activity tolerance, Decreased strength, Pain, Decreased balance, Difficulty walking, Decreased mobility, Decreased range of motion, Improper body mechanics, Impaired flexibility, Decreased safety awareness  Visit Diagnosis: Pain in left hip - Plan: PT plan of care cert/re-cert  Pain in right hip - Plan: PT plan of care cert/re-cert  Other abnormalities  of gait and mobility - Plan: PT plan of care cert/re-cert     Problem List Patient Active Problem List   Diagnosis Date Noted  . UTI (urinary tract infection) 10/07/2016  . Major depressive disorder, recurrent severe without psychotic features (HCC) 09/02/2016  . GERD (gastroesophageal reflux disease) 01/30/2016  . Intentional drug overdose (HCC) 11/22/2015  . Atrial fibrillation (HCC)   . Chronic pain syndrome    Sedalia Muta, PT, DPT 1:34 PM  10/14/17    [facility] [address] [place], [place], [address] Phone: [phone]   Fax:  [phone]  Name: Danielle Rocha MRN: [id number] Date of Birth: Aug 29, 1951

## 2017-10-19 ENCOUNTER — Ambulatory Visit (INDEPENDENT_AMBULATORY_CARE_PROVIDER_SITE_OTHER): Payer: BLUE CROSS/BLUE SHIELD | Admitting: Internal Medicine

## 2017-10-19 ENCOUNTER — Encounter: Payer: Self-pay | Admitting: Internal Medicine

## 2017-10-19 VITALS — BP 118/68 | HR 74 | Ht 67.0 in | Wt 159.8 lb

## 2017-10-19 DIAGNOSIS — E782 Mixed hyperlipidemia: Secondary | ICD-10-CM

## 2017-10-19 DIAGNOSIS — I480 Paroxysmal atrial fibrillation: Secondary | ICD-10-CM | POA: Diagnosis not present

## 2017-10-19 NOTE — Progress Notes (Signed)
Cardiology Office Note   Date:  10/19/2017   ID:  Danielle Rocha, DOB 07/04/1951, MRN [id number]  PCP:  Shelva MajesticHunter, Stephen O, MD  Cardiologist:   Dietrich PatesPaula Niall Illes, MD    Pt presents for f/u of PAF    History of Present Illness: Danielle Rocha is a 67 y.o. female with a history of atrial fib I saw her for the first time in 2017  Symptoms of afib began several years ago  [place] in [place]  Episodes were incapacitating  Tired  Extremely  Had ablation of atrial flutter  3 to 5 years   Dr Hulda Marinemo did ablation  She has had only one recurrence since of atrial fib  I saw the pt in 2017  Doing good  Denies CP  Breathing is OK  Activity limited by hip and back pain.   Current Outpatient Medications  Medication Sig Dispense Refill  . acetaminophen (TYLENOL) 500 MG tablet Take 1,000 mg by mouth every 6 (six) hours as needed for mild pain, moderate pain, fever or headache.    Marland Kitchen. atorvastatin (LIPITOR) 20 MG tablet TAKE 1 TABLET BY MOUTH ONCE DAILY 90 tablet 2  . flecainide (TAMBOCOR) 100 MG tablet TAKE 1 TABLET BY MOUTH TWICE DAILY 180 tablet 3  . gabapentin (NEURONTIN) 300 MG capsule Take 300 mg by mouth at bedtime.    Marland Kitchen. HYDROcodone-acetaminophen (NORCO/VICODIN) 5-325 MG tablet Take 0.5-1 tablets 2 (two) times daily as needed by mouth for moderate pain or severe pain (may fill in 1 month). 30 tablet 0  . Melatonin 3 MG TABS Take 2 tablets by mouth at bedtime.     . Multiple Vitamin (MULTIVITAMIN WITH MINERALS) TABS tablet Take 1 tablet by mouth daily.    Marland Kitchen. OLANZapine (ZYPREXA) 15 MG tablet Take 15 mg by mouth at bedtime.    Marland Kitchen. omeprazole (PRILOSEC) 20 MG capsule TAKE ONE CAPSULE BY MOUTH TWICE DAILY BEFORE A MEAL 60 capsule 6  . rivaroxaban (XARELTO) 20 MG TABS tablet Take 1 tablet (20 mg total) by mouth daily with supper. 90 tablet 3  . traZODone (DESYREL) 150 MG tablet Take 300 mg by mouth at bedtime.     No current facility-administered medications for this visit.     Allergies:    Fish allergy; Reglan [metoclopramide]; Ciprofloxacin; Clindamycin/lincomycin; Vibramycin [doxycycline]; Macrobid [nitrofurantoin monohyd macro]; and Sulfa antibiotics   Past Medical History:  Diagnosis Date  . Arthritis    DJD, low back, thumb  . Atrial fibrillation (HCC)    Xarelto anticoagulation. Flecainide antiarrythmic   . Chicken pox   . Chronic pain syndrome    On disability. History of bilateral hip pain, low back pain. Gabapentin 400 BID, percocet 1 tablet daily per prior provider.   . Colon polyp    awaiting records  . Depression    zoloft 100mg , remeron 30mg  per psychiatry. ambien 10mg  per psychiatry to help wtih sleep element.   . Dystonia    described as psychogenic dystonia. Pain and twisting from upper chest and up ith triggers "wind, creamy food" on TID ativan per psychiatry previously.   . Fibromyalgia   . GERD (gastroesophageal reflux disease)    omeprazole OTC  . Goiter    states multiple imaging tests, has had biopsies  . Hyperlipidemia    lovastatin 20mg   . Stroke [facility](HCC)    TIA (left side of face and body decreased sensitivity than right face and side)  . TIA (transient ischemic attack)   . Vaginal atrophy  estrace vaginal cream    Past Surgical History:  Procedure Laterality Date  . ATRIAL FIBRILLATION ABLATION    . BIOPSY THYROID    . fusion l4-l5  2000  . LAMINECTOMY     L4-L5  . OTHER SURGICAL HISTORY     tennis elbow surgery  . piriformis release  1999  . TONSILLECTOMY AND ADENOIDECTOMY  age 72  . VAGINAL HYSTERECTOMY  1993     Social History:  The patient  reports that  has never smoked. she has never used smokeless tobacco. She reports that she does not drink alcohol or use drugs.   Family History:  The patient's family history includes Alcohol abuse in her father and mother; Cancer in her brother and brother; Hypertension in her father; Pancreatic cancer in her father; Skin cancer in her brother.    ROS:  Please see the history of  present illness. All other systems are reviewed and  Negative to the above problem except as noted.    PHYSICAL EXAM: VS:  BP 118/68   Pulse 74   Ht 5\' 7"  (1.702 m)   Wt 159 lb 12.8 oz (72.5 kg)   BMI 25.03 kg/m   GEN: Well nourished, well developed, in no acute distress  HEENT: normal  Neck: JVP normal , carotid bruits, or masses Cardiac: RRR; no murmurs, rubs, or gallops,no edema  Respiratory:  clear to auscultation bilaterally, normal work of breathing GI: soft, nontender, nondistended, + BS  No hepatomegaly  MS: no deformity Moving all extremities   Skin: warm and dry, no rash Neuro:  Strength and sensation are intact Psych: euthymic mood, full affect   EKG:  EKG is ordered today. SR 74 bpm    Lipid Panel No results found for: CHOL, TRIG, HDL, CHOLHDL, VLDL, LDLCALC, LDLDIRECT    Wt Readings from Last 3 Encounters:  10/19/17 159 lb 12.8 oz (72.5 kg)  07/29/17 157 lb 12.8 oz (71.6 kg)  04/27/17 156 lb 3.2 oz (70.9 kg)      ASSESSMENT AND PLAN:  1  PAF Doing good  No symtpoms of palpitations  WIll get labs forwarded when done next [facility]   Keep on current regimen   OK for IM to Rx   WIll plan to see in 1 year  2  HL  Will review when done Keep on statin    Signed, Dietrich Pates, MD  10/19/2017 1:52 PM    [facility] [address] [place], [place], [place]  [address] Phone: [phone]; Fax: [phone]    Addendum  Outside records from [place]  Pt s/p flutter ablation in August 2013 Dr b. Hulda Marin.   Abnormal EKG  T wave inversion V1-V6, I, AVL   Flecanide Rx   ? TIA in past mutinodular goiter ANA positive  I INtermitt L sided numbness.  Cause unknown Hyperlipidemia Stress cardiolite  August 2013  Peak HR 130  BP 168/    Dietrich Pates 10/19/2017

## 2017-10-19 NOTE — Patient Instructions (Signed)
Your physician recommends that you continue on your current medications as directed. Please refer to the Current Medication list given to you today. Your physician wants you to follow-up in: 1 year with Dr. Tenny Crawoss.  You will receive a reminder letter in the mail two months in advance. If you don't receive a letter, please call our office to schedule the follow-up appointment.  Please request labs from PCP be forwarded to Dr. Tenny Crawoss (see attached prescription)

## 2017-10-21 ENCOUNTER — Encounter: Payer: Self-pay | Admitting: Physical Therapy

## 2017-10-21 ENCOUNTER — Ambulatory Visit (INDEPENDENT_AMBULATORY_CARE_PROVIDER_SITE_OTHER): Payer: BLUE CROSS/BLUE SHIELD | Admitting: Physical Therapy

## 2017-10-21 DIAGNOSIS — R2689 Other abnormalities of gait and mobility: Secondary | ICD-10-CM | POA: Diagnosis not present

## 2017-10-21 DIAGNOSIS — M25552 Pain in left hip: Secondary | ICD-10-CM | POA: Diagnosis not present

## 2017-10-21 DIAGNOSIS — M25551 Pain in right hip: Secondary | ICD-10-CM

## 2017-10-21 NOTE — Therapy (Signed)
[facility] [address] [place], [place], [address] Phone: [phone]   Fax:  [phone]  Physical Therapy Treatment  Patient Details  Name: Danielle Rocha MRN: [id number] Date of Birth: Aug 25, 1951 Referring Provider: Durene Cal   Encounter Date: 10/21/2017  PT End of Session - 10/21/17 1132    Visit Number  2    Number of Visits  12    Date for PT Re-Evaluation  11/25/17    Authorization Type  BCBS/ Medicare $20    PT Start Time  1045    PT Stop Time  1140    PT Time Calculation (min)  55 min    Activity Tolerance  Patient tolerated treatment well    Behavior During Therapy  [facility] for tasks assessed/performed       Past Medical History:  Diagnosis Date  . Arthritis    DJD, low back, thumb  . Atrial fibrillation (HCC)    Xarelto anticoagulation. Flecainide antiarrythmic   . Chicken pox   . Chronic pain syndrome    On disability. History of bilateral hip pain, low back pain. Gabapentin 400 BID, percocet 1 tablet daily per prior provider.   . Colon polyp    awaiting records  . Depression    zoloft 100mg , remeron 30mg  per psychiatry. ambien 10mg  per psychiatry to help wtih sleep element.   . Dystonia    described as psychogenic dystonia. Pain and twisting from upper chest and up ith triggers "wind, creamy food" on TID ativan per psychiatry previously.   . Fibromyalgia   . GERD (gastroesophageal reflux disease)    omeprazole OTC  . Goiter    states multiple imaging tests, has had biopsies  . Hyperlipidemia    lovastatin 20mg   . Stroke (HCC)    TIA (left side of face and body decreased sensitivity than right face and side)  . TIA (transient ischemic attack)   . Vaginal atrophy    estrace vaginal cream    Past Surgical History:  Procedure Laterality Date  . ATRIAL FIBRILLATION ABLATION    . BIOPSY THYROID    . fusion l4-l5  2000  . LAMINECTOMY     L4-L5  . OTHER SURGICAL HISTORY     tennis elbow surgery  . piriformis  release  1999  . TONSILLECTOMY AND ADENOIDECTOMY  age 30  . VAGINAL HYSTERECTOMY  1993    There were no vitals filed for this visit.  Subjective Assessment - 10/21/17 1130    Subjective  Pt states that she has been doing HEP. Does have mild increase in soreness from fibromyalgia with starting HEP.     Currently in Pain?  Yes    Pain Score  7     Pain Location  Hip    Pain Orientation  Right    Pain Descriptors / Indicators  Tender;Sore;Aching    Pain Onset  More than a month ago    Pain Frequency  Constant                      [facility] Adult PT Treatment/Exercise - 10/21/17 1049      Ambulation/Gait   Ambulation/Gait  --    Ambulation/Gait Assistance  --    Ambulation Distance (Feet)  --    Gait Pattern  --    Gait Comments  --      Exercises   Exercises  Knee/Hip      Knee/Hip Exercises: Stretches   Active Hamstring Stretch  30  seconds;3 reps    Active Hamstring Stretch Limitations  seated    Piriformis Stretch  3 reps;30 seconds    Piriformis Stretch Limitations  mod fig 4 pos/supine    Other Knee/Hip Stretches  SKTC 30 sec x3; Hip ER clam stretch /supine 10 sec x 5      Knee/Hip Exercises: Standing   Hip Abduction  5 reps;Both    Other Standing Knee Exercises  Marching 2x10    Other Standing Knee Exercises  Toe taps x20;  Tandem stance 30 sec Bil; Rhomber stance with head turns x20; A/P stepping with weight shifts x10 bil      Modalities   Modalities  Moist Heat;Electrical Stimulation      Moist Heat Therapy   Number Minutes Moist Heat  13 Minutes    Moist Heat Location  Lumbar Spine      Electrical Stimulation   Electrical Stimulation Location  Bil Hips/Low back    Electrical Stimulation Parameters  PreMod 4 pads, x10 min    Electrical Stimulation Goals  Pain      Manual Therapy   Manual Therapy  Passive ROM    Passive ROM  Passive stretching in supine for all hip motions and hamstrings.              PT Education - 10/21/17 1131     Education provided  Yes    Education Details  HEP, pain relief     Person(s) Educated  Patient    Methods  Explanation    Comprehension  Verbalized understanding       PT Short Term Goals - 10/14/17 1313      PT SHORT TERM GOAL #1   Title  Pt to be independent with initial HEP    Time  2    Period  Weeks    Status  New    Target Date  10/28/17      PT SHORT TERM GOAL #2   Title  Pt to report decreased pain in B hips to 4/10 with activity     Time  2    Period  Weeks    Status  New    Target Date  10/28/17        PT Long Term Goals - 10/14/17 1313      PT LONG TERM GOAL #1   Title  Pt to demo Bil Hip ROM to be WNL, to improve ability for squatting and stooping    Time  6    Period  Weeks    Status  New    Target Date  11/25/17      PT LONG TERM GOAL #2   Title  Pt to demo increased LE strength to at least 4+/5 to improve gait, balance, and ability to transfer from floor to standing position.     Time  6    Period  Weeks    Status  New    Target Date  11/25/17      PT LONG TERM GOAL #3   Title  Pt to demo ability for safe ambulation and stair navigation, with balance WFL,  to decrease risk for falls     Time  6    Period  Weeks    Status  New    Target Date  11/25/17            Plan - 10/21/17 1328    Clinical Impression Statement  Ther ex progressed today, for gentle LE strength. Pt  challenged with balance exercises as well. Pt states increased soreness when performing activities today. E-Stim and heat done at end of session to decrease soreness. Discussed probale soreness when starting new exercise routine with pt today, she states understanding.     Rehab Potential  Good    PT Frequency  2x / week    PT Duration  6 weeks    PT Treatment/Interventions  ADLs/Self Care Home Management;Cryotherapy;Electrical Stimulation;Moist Heat;Therapeutic exercise;Therapeutic activities;Functional mobility training;Stair training;Gait training;Ultrasound;Balance  training;Neuromuscular re-education;Patient/family education;Manual techniques;Taping;Dry needling;Passive range of motion    Consulted and Agree with Plan of Care  Patient       Patient will benefit from skilled therapeutic intervention in order to improve the following deficits and impairments:  Abnormal gait, Decreased endurance, Hypomobility, Decreased activity tolerance, Decreased strength, Pain, Decreased balance, Difficulty walking, Decreased mobility, Decreased range of motion, Improper body mechanics, Impaired flexibility, Decreased safety awareness  Visit Diagnosis: Pain in left hip  Pain in right hip  Other abnormalities of gait and mobility     Problem List Patient Active Problem List   Diagnosis Date Noted  . UTI (urinary tract infection) 10/07/2016  . Major depressive disorder, recurrent severe without psychotic features (HCC) 09/02/2016  . GERD (gastroesophageal reflux disease) 01/30/2016  . Intentional drug overdose (HCC) 11/22/2015  . Atrial fibrillation (HCC)   . Chronic pain syndrome    Sedalia Muta, PT, DPT 1:33 PM  10/21/17    [facility] [address] [place], [place], [address] Phone: [phone]   Fax:  [phone]  Name: Danielle Rocha MRN: [id number] Date of Birth: [birth year]

## 2017-10-26 ENCOUNTER — Ambulatory Visit (INDEPENDENT_AMBULATORY_CARE_PROVIDER_SITE_OTHER): Payer: BLUE CROSS/BLUE SHIELD | Admitting: Physical Therapy

## 2017-10-26 ENCOUNTER — Encounter: Payer: Self-pay | Admitting: Physical Therapy

## 2017-10-26 DIAGNOSIS — M25552 Pain in left hip: Secondary | ICD-10-CM | POA: Diagnosis not present

## 2017-10-26 DIAGNOSIS — R2689 Other abnormalities of gait and mobility: Secondary | ICD-10-CM

## 2017-10-26 DIAGNOSIS — M25551 Pain in right hip: Secondary | ICD-10-CM

## 2017-10-26 NOTE — Therapy (Signed)
[facility] [address] [place], [place], [address] Phone: [phone]   Fax:  [phone]  Physical Therapy Treatment  Patient Details  Name: Danielle Rocha MRN: [id number] Date of Birth: 1950/12/17 Referring Provider: Durene Cal   Encounter Date: 10/26/2017  PT End of Session - 10/26/17 1516    Visit Number  3    Number of Visits  12    Date for PT Re-Evaluation  11/25/17    Authorization Type  BCBS/ Medicare $20    PT Start Time  1430    PT Stop Time  1525    PT Time Calculation (min)  55 min    Activity Tolerance  Patient tolerated treatment well    Behavior During Therapy  [facility] for tasks assessed/performed       Past Medical History:  Diagnosis Date  . Arthritis    DJD, low back, thumb  . Atrial fibrillation (HCC)    Xarelto anticoagulation. Flecainide antiarrythmic   . Chicken pox   . Chronic pain syndrome    On disability. History of bilateral hip pain, low back pain. Gabapentin 400 BID, percocet 1 tablet daily per prior provider.   . Colon polyp    awaiting records  . Depression    zoloft 100mg , remeron 30mg  per psychiatry. ambien 10mg  per psychiatry to help wtih sleep element.   . Dystonia    described as psychogenic dystonia. Pain and twisting from upper chest and up ith triggers "wind, creamy food" on TID ativan per psychiatry previously.   . Fibromyalgia   . GERD (gastroesophageal reflux disease)    omeprazole OTC  . Goiter    states multiple imaging tests, has had biopsies  . Hyperlipidemia    lovastatin 20mg   . Stroke (HCC)    TIA (left side of face and body decreased sensitivity than right face and side)  . TIA (transient ischemic attack)   . Vaginal atrophy    estrace vaginal cream    Past Surgical History:  Procedure Laterality Date  . ATRIAL FIBRILLATION ABLATION    . BIOPSY THYROID    . fusion l4-l5  2000  . LAMINECTOMY     L4-L5  . OTHER SURGICAL HISTORY     tennis elbow surgery  . piriformis  release  1999  . TONSILLECTOMY AND ADENOIDECTOMY  age 67  . VAGINAL HYSTERECTOMY  1993    There were no vitals filed for this visit.  Subjective Assessment - 10/26/17 1439    Subjective  Pt states soreness after last visit. She does think e-stim helped to calm down pain after session. She is still getting sore when doing HEP (light stretching)    Currently in Pain?  Yes    Pain Score  6     Pain Location  Hip    Pain Orientation  Right;Left    Pain Descriptors / Indicators  Tender;Sore;Aching    Pain Onset  More than a month ago    Pain Frequency  Constant                      [facility] Adult PT Treatment/Exercise - 10/26/17 1426      Exercises   Exercises  Knee/Hip;Lumbar      Lumbar Exercises: Stretches   Lower Trunk Rotation Limitations  x10      Lumbar Exercises: Aerobic   Stationary Bike  L1 x4 min      Knee/Hip Exercises: Stretches   Active Hamstring Stretch  30 seconds;3  reps    Active Hamstring Stretch Limitations  seated    Piriformis Stretch  3 reps;30 seconds    Piriformis Stretch Limitations  mod fig 4 pos/supine    Other Knee/Hip Stretches  SKTC 30 sec x3; Hip ER clam stretch /supine 10 sec x 5      Knee/Hip Exercises: Standing   Hip Abduction  --    Other Standing Knee Exercises  --    Other Standing Knee Exercises  --      Knee/Hip Exercises: Seated   Long Arc Quad  10 reps;Both      Knee/Hip Exercises: Supine   Hip Adduction Isometric  10 reps    Hip Adduction Isometric Limitations  Ball Squeeze    Other Supine Knee/Hip Exercises  Clam with YTB x10    Other Supine Knee/Hip Exercises  Heel slides x15 bil       Modalities   Modalities  Moist Heat;Electrical Stimulation      Moist Heat Therapy   Number Minutes Moist Heat  12 Minutes    Moist Heat Location  Lumbar Spine      Electrical Stimulation   Electrical Stimulation Location  Bil Hips/Low back    Electrical Stimulation Parameters  PreMod 4 pads x12 min    Electrical Stimulation  Goals  Pain      Manual Therapy   Manual Therapy  Passive ROM;Myofascial release    Myofascial Release  STM with ball to B ITB     Passive ROM  Passive stretching in supine for all hip motions              PT Education - 10/26/17 2022    Education provided  Yes    Education Details  HEP, pain relief    Person(s) Educated  Patient    Methods  Explanation    Comprehension  Verbalized understanding       PT Short Term Goals - 10/14/17 1313      PT SHORT TERM GOAL #1   Title  Pt to be independent with initial HEP    Time  2    Period  Weeks    Status  New    Target Date  10/28/17      PT SHORT TERM GOAL #2   Title  Pt to report decreased pain in B hips to 4/10 with activity     Time  2    Period  Weeks    Status  New    Target Date  10/28/17        PT Long Term Goals - 10/14/17 1313      PT LONG TERM GOAL #1   Title  Pt to demo Bil Hip ROM to be WNL, to improve ability for squatting and stooping    Time  6    Period  Weeks    Status  New    Target Date  11/25/17      PT LONG TERM GOAL #2   Title  Pt to demo increased LE strength to at least 4+/5 to improve gait, balance, and ability to transfer from floor to standing position.     Time  6    Period  Weeks    Status  New    Target Date  11/25/17      PT LONG TERM GOAL #3   Title  Pt to demo ability for safe ambulation and stair navigation, with balance WFL,  to decrease risk for falls  Time  6    Period  Weeks    Status  New    Target Date  11/25/17            Plan - 10/26/17 1517    Clinical Impression Statement  Pt continues to report constant tenderness and pain in Bil hips and legs. She has much tenderness even with light touch and light soft tissue mobilization today. Activity level has been very low , to not increase pain, but pt still complains of increased pain with most all movements.     Rehab Potential  Good    PT Frequency  2x / week    PT Duration  6 weeks    PT  Treatment/Interventions  ADLs/Self Care Home Management;Cryotherapy;Electrical Stimulation;Moist Heat;Therapeutic exercise;Therapeutic activities;Functional mobility training;Stair training;Gait training;Ultrasound;Balance training;Neuromuscular re-education;Patient/family education;Manual techniques;Taping;Dry needling;Passive range of motion    Consulted and Agree with Plan of Care  Patient       Patient will benefit from skilled therapeutic intervention in order to improve the following deficits and impairments:  Abnormal gait, Decreased endurance, Hypomobility, Decreased activity tolerance, Decreased strength, Pain, Decreased balance, Difficulty walking, Decreased mobility, Decreased range of motion, Improper body mechanics, Impaired flexibility, Decreased safety awareness  Visit Diagnosis: Pain in left hip  Pain in right hip  Other abnormalities of gait and mobility     Problem List Patient Active Problem List   Diagnosis Date Noted  . UTI (urinary tract infection) 10/07/2016  . Major depressive disorder, recurrent severe without psychotic features (HCC) 09/02/2016  . GERD (gastroesophageal reflux disease) 01/30/2016  . Intentional drug overdose (HCC) 11/22/2015  . Atrial fibrillation (HCC)   . Chronic pain syndrome    Sedalia MutaLauren Zanasia Hickson, PT, DPT 8:28 PM  10/26/17    [facility] [address] [address] [place], [place], [address] Phone: [phone]   Fax:  [phone]  Name: Danielle Rocha MRN: [id number] Date of Birth: 10/09/1950

## 2017-10-28 ENCOUNTER — Encounter: Payer: Self-pay | Admitting: Family Medicine

## 2017-10-28 ENCOUNTER — Encounter: Payer: Self-pay | Admitting: Physical Therapy

## 2017-10-28 ENCOUNTER — Ambulatory Visit (INDEPENDENT_AMBULATORY_CARE_PROVIDER_SITE_OTHER): Payer: BLUE CROSS/BLUE SHIELD | Admitting: Family Medicine

## 2017-10-28 ENCOUNTER — Ambulatory Visit (INDEPENDENT_AMBULATORY_CARE_PROVIDER_SITE_OTHER): Payer: BLUE CROSS/BLUE SHIELD | Admitting: Physical Therapy

## 2017-10-28 VITALS — BP 98/70 | HR 68 | Temp 97.5°F | Ht 67.0 in | Wt 157.2 lb

## 2017-10-28 DIAGNOSIS — R2689 Other abnormalities of gait and mobility: Secondary | ICD-10-CM

## 2017-10-28 DIAGNOSIS — G894 Chronic pain syndrome: Secondary | ICD-10-CM | POA: Diagnosis not present

## 2017-10-28 DIAGNOSIS — F332 Major depressive disorder, recurrent severe without psychotic features: Secondary | ICD-10-CM

## 2017-10-28 DIAGNOSIS — E785 Hyperlipidemia, unspecified: Secondary | ICD-10-CM

## 2017-10-28 DIAGNOSIS — M25551 Pain in right hip: Secondary | ICD-10-CM | POA: Diagnosis not present

## 2017-10-28 DIAGNOSIS — I482 Chronic atrial fibrillation, unspecified: Secondary | ICD-10-CM

## 2017-10-28 DIAGNOSIS — M25552 Pain in left hip: Secondary | ICD-10-CM | POA: Diagnosis not present

## 2017-10-28 LAB — LIPID PANEL
CHOL/HDL RATIO: 3
Cholesterol: 160 mg/dL (ref 0–200)
HDL: 52 mg/dL (ref >39.00)
LDL Cholesterol: 75 mg/dL (ref 0–99)
NONHDL: 107.82
TRIGLYCERIDES: 166 mg/dL — AB (ref 0.0–149.0)
VLDL: 33.2 mg/dL (ref 0.0–40.0)

## 2017-10-28 LAB — CBC
HCT: 43.1 % (ref 36.0–46.0)
Hemoglobin: 14.8 g/dL (ref 12.0–15.0)
MCHC: 34.4 g/dL (ref 30.0–36.0)
MCV: 87.7 fl (ref 78.0–100.0)
PLATELETS: 201 10*3/uL (ref 150.0–400.0)
RBC: 4.91 Mil/uL (ref 3.87–5.11)
RDW: 13.1 % (ref 11.5–15.5)
WBC: 7 10*3/uL (ref 4.0–10.5)

## 2017-10-28 LAB — COMPREHENSIVE METABOLIC PANEL
ALT: 14 U/L (ref 0–35)
AST: 12 U/L (ref 0–37)
Albumin: 4.3 g/dL (ref 3.5–5.2)
Alkaline Phosphatase: 44 U/L (ref 39–117)
BILIRUBIN TOTAL: 0.6 mg/dL (ref 0.2–1.2)
BUN: 15 mg/dL (ref 6–23)
CALCIUM: 9 mg/dL (ref 8.4–10.5)
CO2: 29 meq/L (ref 19–32)
Chloride: 104 mEq/L (ref 96–112)
Creatinine, Ser: 0.97 mg/dL (ref 0.40–1.20)
GFR: 61.16 mL/min (ref >60.00)
GLUCOSE: 105 mg/dL — AB (ref 70–99)
Potassium: 4 mEq/L (ref 3.5–5.1)
Sodium: 140 mEq/L (ref 135–145)
Total Protein: 6.8 g/dL (ref 6.0–8.3)

## 2017-10-28 MED ORDER — HYDROCODONE-ACETAMINOPHEN 5-325 MG PO TABS: 0.5000 | ORAL_TABLET | Freq: Two times a day (BID) | ORAL | 0 refills | Status: DC | PRN

## 2017-10-28 MED ORDER — HYDROCODONE-ACETAMINOPHEN 5-325 MG PO TABS
0.5000 | ORAL_TABLET | Freq: Two times a day (BID) | ORAL | 0 refills | Status: DC | PRN
Start: 2017-10-28 — End: 2017-11-12

## 2017-10-28 MED ORDER — HYDROCODONE-ACETAMINOPHEN 5-325 MG PO TABS
0.5000 | ORAL_TABLET | Freq: Two times a day (BID) | ORAL | 0 refills | Status: DC | PRN
Start: 2017-10-28 — End: 2018-01-25

## 2017-10-28 NOTE — Assessment & Plan Note (Signed)
S: referred to cardiology - saw Dr. Tenny Crawoss on 10/19/17. She is on xarelto for anticoagulation and flecainide for antiarrhythmic. Dr. Tenny Crawoss was ok with flecainide to come from primary care as long as stable.  A/P: continue current medicines- Dr. Tenny Crawoss mentioned 1 year follow up.

## 2017-10-28 NOTE — Assessment & Plan Note (Signed)
S: no recent lipids on atorvastatin 20mg  A/P: update lipids today- ideally LDL at least under 100 if not under 70

## 2017-10-28 NOTE — Therapy (Signed)
[facility] [address] [place], [place], [address] Phone: [phone]   Fax:  [phone]  Physical Therapy Treatment  Patient Details  Name: Danielle Rocha MRN: [id number] Date of Birth: [birth year] Referring Provider: Durene Cal   Encounter Date: 10/28/2017  PT End of Session - 10/28/17 1218    Visit Number  4    Number of Visits  12    Date for PT Re-Evaluation  11/25/17    Authorization Type  BCBS/ Medicare $20    PT Start Time  1135    PT Stop Time  1228    PT Time Calculation (min)  53 min    Activity Tolerance  Patient tolerated treatment well    Behavior During Therapy  [facility] for tasks assessed/performed       Past Medical History:  Diagnosis Date  . Arthritis    DJD, low back, thumb  . Atrial fibrillation (HCC)    Xarelto anticoagulation. Flecainide antiarrythmic   . Chicken pox   . Chronic pain syndrome    On disability. History of bilateral hip pain, low back pain. Gabapentin 400 BID, percocet 1 tablet daily per prior provider.   . Colon polyp    awaiting records  . Depression    zoloft 100mg , remeron 30mg  per psychiatry. ambien 10mg  per psychiatry to help wtih sleep element.   . Dystonia    described as psychogenic dystonia. Pain and twisting from upper chest and up ith triggers "wind, creamy food" on TID ativan per psychiatry previously.   . Fibromyalgia   . GERD (gastroesophageal reflux disease)    omeprazole OTC  . Goiter    states multiple imaging tests, has had biopsies  . Hyperlipidemia    lovastatin 20mg   . Stroke [facility])    TIA (left side of face and body decreased sensitivity than right face and side)  . TIA (transient ischemic attack)   . Vaginal atrophy    estrace vaginal cream    Past Surgical History:  Procedure Laterality Date  . ATRIAL FIBRILLATION ABLATION    . BIOPSY THYROID    . fusion l4-l5  2000  . LAMINECTOMY     L4-L5  . OTHER SURGICAL HISTORY     tennis elbow surgery  . piriformis  release  1999  . TONSILLECTOMY AND ADENOIDECTOMY  age 67  . VAGINAL HYSTERECTOMY  1993    There were no vitals filed for this visit.  Subjective Assessment - 10/28/17 1217    Subjective  Pt states continued soreness, and soreness after last visit. She had follow up today with MD, but did not ask him about medication for muscle pain.     Currently in Pain?  Yes    Pain Score  7     Pain Location  Hip    Pain Orientation  Right;Left    Pain Descriptors / Indicators  Tender;Sore;Throbbing    Pain Type  Chronic pain    Pain Onset  More than a month ago    Pain Frequency  Constant                      [facility] Adult PT Treatment/Exercise - 10/28/17 1143      Exercises   Exercises  Knee/Hip;Lumbar      Lumbar Exercises: Stretches   Lower Trunk Rotation Limitations  x10      Lumbar Exercises: Aerobic   Stationary Bike  L1 x      Knee/Hip Exercises: Stretches  Active Hamstring Stretch  30 seconds;3 reps    Active Hamstring Stretch Limitations  seated    Piriformis Stretch  3 reps;30 seconds    Piriformis Stretch Limitations  mod fig 4 pos/supine    Other Knee/Hip Stretches  SKTC 30 sec x3; Hip ER clam stretch /supine 10 sec x 5      Knee/Hip Exercises: Seated   Long Arc Quad  10 reps;Both      Knee/Hip Exercises: Supine   Hip Adduction Isometric  10 reps    Hip Adduction Isometric Limitations  Ball Squeeze    Other Supine Knee/Hip Exercises  Clam with YTB x10    Other Supine Knee/Hip Exercises  Heel slides x15 bil       Modalities   Modalities  Moist Heat;Electrical Stimulation      Moist Heat Therapy   Number Minutes Moist Heat  10 Minutes    Moist Heat Location  Hip      Electrical Stimulation   Electrical Stimulation Location  Bil Hips    Electrical Stimulation Parameters  PreMod 4 pads x10  min    Electrical Stimulation Goals  Pain      Manual Therapy   Manual Therapy  Passive ROM;Myofascial release    Myofascial Release  --    Passive ROM   Passive stretching in supine for all hip motions; Long Leg distraction bil              PT Education - 10/28/17 1218    Education provided  Yes    Education Details  HEP, pain relief    Person(s) Educated  Patient    Methods  Explanation    Comprehension  Verbalized understanding       PT Short Term Goals - 10/14/17 1313      PT SHORT TERM GOAL #1   Title  Pt to be independent with initial HEP    Time  2    Period  Weeks    Status  New    Target Date  10/28/17      PT SHORT TERM GOAL #2   Title  Pt to report decreased pain in B hips to 4/10 with activity     Time  2    Period  Weeks    Status  New    Target Date  10/28/17        PT Long Term Goals - 10/14/17 1313      PT LONG TERM GOAL #1   Title  Pt to demo Bil Hip ROM to be WNL, to improve ability for squatting and stooping    Time  6    Period  Weeks    Status  New    Target Date  11/25/17      PT LONG TERM GOAL #2   Title  Pt to demo increased LE strength to at least 4+/5 to improve gait, balance, and ability to transfer from floor to standing position.     Time  6    Period  Weeks    Status  New    Target Date  11/25/17      PT LONG TERM GOAL #3   Title  Pt to demo ability for safe ambulation and stair navigation, with balance WFL,  to decrease risk for falls     Time  6    Period  Weeks    Status  New    Target Date  11/25/17  Plan - 10/28/17 1220    Clinical Impression Statement  Pt with constant soreness that has not improved much with increased activity. She states that exercises increase soreness, although she is only performing AROM, stretching, and E-stim. She is improving with ablity for exercises and with hip ROM. Discussed aquatic therapy with pt, may consider if pain levels do not improve.     Rehab Potential  Good    PT Frequency  2x / week    PT Duration  6 weeks    PT Treatment/Interventions  ADLs/Self Care Home Management;Cryotherapy;Electrical Stimulation;Moist  Heat;Therapeutic exercise;Therapeutic activities;Functional mobility training;Stair training;Gait training;Ultrasound;Balance training;Neuromuscular re-education;Patient/family education;Manual techniques;Taping;Dry needling;Passive range of motion    Consulted and Agree with Plan of Care  Patient       Patient will benefit from skilled therapeutic intervention in order to improve the following deficits and impairments:  Abnormal gait, Decreased endurance, Hypomobility, Decreased activity tolerance, Decreased strength, Pain, Decreased balance, Difficulty walking, Decreased mobility, Decreased range of motion, Improper body mechanics, Impaired flexibility, Decreased safety awareness  Visit Diagnosis: Pain in left hip  Pain in right hip  Other abnormalities of gait and mobility     Problem List Patient Active Problem List   Diagnosis Date Noted  . UTI (urinary tract infection) 10/07/2016  . Major depressive disorder, recurrent severe without psychotic features (HCC) 09/02/2016  . GERD (gastroesophageal reflux disease) 01/30/2016  . Intentional drug overdose (HCC) 11/22/2015  . Atrial fibrillation (HCC)   . Chronic pain syndrome   . Hyperlipidemia    Sedalia MutaLauren Monty Spicher, PT, DPT 12:23 PM  10/28/17    [facility] [address] [place], [place], [address] Phone: [phone]   Fax:  [phone]  Name: Rosine DoorChristy Tomaszewski MRN: [id number] Date of Birth: 06/13/1951

## 2017-10-28 NOTE — Progress Notes (Signed)
Subjective:  Danielle Rocha is a 67 y.o. year old very pleasant female patient who presents for/with See problem oriented charting ROS- has depressed mood and anhedonia. No current SI and none since medication adjustments. Denies palpitations or chest pain.    Past Medical History-  Patient Active Problem List   Diagnosis Date Noted  . Major depressive disorder, recurrent severe without psychotic features (HCC) 09/02/2016    Priority: High  . History of SI/intentional drug overdose 11/22/2015    Priority: High  . Atrial fibrillation (HCC)     Priority: High  . Chronic pain syndrome     Priority: High  . Hyperlipidemia     Priority: Medium  . GERD (gastroesophageal reflux disease) 01/30/2016    Priority: Low  . UTI (urinary tract infection) 10/07/2016    Medications- reviewed and updated Current Outpatient Medications  Medication Sig Dispense Refill  . acetaminophen (TYLENOL) 500 MG tablet Take 1,000 mg by mouth every 6 (six) hours as needed for mild pain, moderate pain, fever or headache.    Marland Kitchen. atorvastatin (LIPITOR) 20 MG tablet TAKE 1 TABLET BY MOUTH ONCE DAILY 90 tablet 2  . flecainide (TAMBOCOR) 100 MG tablet TAKE 1 TABLET BY MOUTH TWICE DAILY 180 tablet 3  . gabapentin (NEURONTIN) 300 MG capsule Take 300 mg by mouth at bedtime.    Marland Kitchen. HYDROcodone-acetaminophen (NORCO/VICODIN) 5-325 MG tablet Take 0.5-1 tablets by mouth 2 (two) times daily as needed for moderate pain or severe pain (may fill in 1 month). 30 tablet 0  . Melatonin 3 MG TABS Take 2 tablets by mouth at bedtime.     . Multiple Vitamin (MULTIVITAMIN WITH MINERALS) TABS tablet Take 1 tablet by mouth daily.    Marland Kitchen. OLANZapine (ZYPREXA) 15 MG tablet Take 20 mg by mouth at bedtime.     Marland Kitchen. omeprazole (PRILOSEC) 20 MG capsule TAKE ONE CAPSULE BY MOUTH TWICE DAILY BEFORE A MEAL 60 capsule 6  . rivaroxaban (XARELTO) 20 MG TABS tablet Take 1 tablet (20 mg total) by mouth daily with supper. 90 tablet 3  . traZODone (DESYREL) 150 MG  tablet Take 300 mg by mouth at bedtime.    Marland Kitchen. HYDROcodone-acetaminophen (NORCO/VICODIN) 5-325 MG tablet Take 0.5-1 tablets by mouth 2 (two) times daily as needed for moderate pain or severe pain (may fill in 2 months). 30 tablet 0  . HYDROcodone-acetaminophen (NORCO/VICODIN) 5-325 MG tablet Take 0.5-1 tablets by mouth 2 (two) times daily as needed for moderate pain or severe pain (may fill today). 30 tablet 0   No current facility-administered medications for this visit.     Objective: BP 98/70 (BP Location: Left Arm, Patient Position: Sitting, Cuff Size: Large)   Pulse 68   Temp (!) 97.5 F (36.4 C) (Oral)   Ht 5\' 7"  (1.702 m)   Wt 157 lb 3.2 oz (71.3 kg)   SpO2 97%   BMI 24.62 kg/m  Gen: NAD, resting comfortably CV: RRR no murmurs rubs or gallops Lungs: CTAB no crackles, wheeze, rhonchi Abdomen: soft/nontender/nondistended/normal bowel sounds.  Ext: no edema Skin: warm, dry Psych: depressed mood other than when talking about her son Danielle Rocha, deeper depresison when talking about missing son hris  Assessment/Plan:  Atrial fibrillation [facility](HCC) S: referred to cardiology - saw Dr. Tenny Crawoss on 10/19/17. She is on xarelto for anticoagulation and flecainide for antiarrhythmic. Dr. Tenny Crawoss was ok with flecainide to come from primary care as long as stable.  A/P: continue current medicines- Dr. Tenny Crawoss mentioned 1 year follow up.   Chronic  pain syndrome S: on disability for chronic pain. Extensive orthopedic workup in past. Had back surgery without improvement.   Areas of pain continue to be low back, bialteral hip and shoulders- right side seems to be worst. Injections are more painful than beneficial for her in regards to knee pain and states has had minimal relief with injections in other areas.   She continues to use hydrocodone half pill twice a day for most part- at other times uses full pill once a day if going to be at home all day.   2 visits ago we trialed prednisone for shoulder pain with  only improvement of point or two.   Going to PT but feels like this has caused more pain.  A/P: Pain is still not perfectly controlled- but she is within our agreement for one pill a day (higher risk for volume of pain medicine with Suicidal attempts in past) - pain contract on file 12/31/15.  - just had UDS 05/07/17 so not yet due - [place] reviewed today - only rx through me here. She has been getting rx monthly. Next will be due tomorrow.   Major depressive disorder, recurrent severe without psychotic features (HCC) S: continue to check in on patient about depression due to suicide attempts by overdose and fact we are using narcotics for pain.   PHQ9 today of 33- and has had to have medicines adjusted- was having some suicidal ideation at night with laying down- this has now resolved- has number to call psychiatrist directly and sees her every 4-6 weeks. On olanzapine 20mg , trazodone 300mg  at bedtime, gabapentin 300mg . Off depakaote at this point.  A/P: poorly controlled but working closely with psychiatry. Patient would like to go up on narcotic but I advised against this in light of prior overdose attempts- better for her to have small volume and does seem to be helping her pain    Future Appointments  Date Time Provider Department Center  11/02/2017  2:30 PM [facility] SUB THERAPIST A [facility]  11/04/2017 12:15 PM [facility] SUB THERAPIST A [facility]  11/09/2017  2:30 PM [facility] SUB THERAPIST A [facility]  11/11/2017 11:30 AM [facility] SUB THERAPIST A [facility]   Return in about 3 months (around 01/25/2018) for follow up- or sooner if needed.  Lab/Order associations: please note had coffee 6 AM with cream and sugar Hyperlipidemia, unspecified hyperlipidemia type - Plan: CBC, Comprehensive metabolic panel, Lipid panel  Meds ordered this encounter  Medications  . HYDROcodone-acetaminophen (NORCO/VICODIN) 5-325 MG tablet    Sig: Take 0.5-1 tablets by mouth 2 (two) times daily as  needed for moderate pain or severe pain (may fill in 1 month).    Dispense:  30 tablet    Refill:  0  . HYDROcodone-acetaminophen (NORCO/VICODIN) 5-325 MG tablet    Sig: Take 0.5-1 tablets by mouth 2 (two) times daily as needed for moderate pain or severe pain (may fill in 2 months).    Dispense:  30 tablet    Refill:  0  . HYDROcodone-acetaminophen (NORCO/VICODIN) 5-325 MG tablet    Sig: Take 0.5-1 tablets by mouth 2 (two) times daily as needed for moderate pain or severe pain (may fill today).    Dispense:  30 tablet    Refill:  0    Return precautions advised.  Tana Conch, MD

## 2017-10-28 NOTE — Assessment & Plan Note (Signed)
S: on disability for chronic pain. Extensive orthopedic workup in past. Had back surgery without improvement.   Areas of pain continue to be low back, bialteral hip and shoulders- right side seems to be worst. Injections are more painful than beneficial for her in regards to knee pain and states has had minimal relief with injections in other areas.   She continues to use hydrocodone half pill twice a day for most part- at other times uses full pill once a day if going to be at home all day.   2 visits ago we trialed prednisone for shoulder pain with only improvement of point or two.   Going to PT but feels like this has caused more pain.  A/P: Pain is still not perfectly controlled- but she is within our agreement for one pill a day (higher risk for volume of pain medicine with Suicidal attempts in past) - pain contract on file 12/31/15.  - just had UDS 05/07/17 so not yet due - [place] reviewed today - only rx through me here. She has been getting rx monthly. Next will be due tomorrow.

## 2017-10-28 NOTE — Assessment & Plan Note (Signed)
S: continue to check in on patient about depression due to suicide attempts by overdose and fact we are using narcotics for pain.   PHQ9 today of 919- and has had to have medicines adjusted- was having some suicidal ideation at night with laying down- this has now resolved- has number to call psychiatrist directly and sees her every 4-6 weeks. On olanzapine 20mg , trazodone 300mg  at bedtime, gabapentin 300mg . Off depakaote at this point.  A/P: poorly controlled but working closely with psychiatry. Patient would like to go up on narcotic but I advised against this in light of prior overdose attempts- better for her to have small volume and does seem to be helping her pain

## 2017-10-28 NOTE — Patient Instructions (Addendum)
Please stop by lab before you go  Sent medicine to pharmacy  No changes today

## 2017-10-29 NOTE — Addendum Note (Signed)
Addended by: Madalyn RobOX, Elona Yinger A on: 10/29/2017 09:45 AM   Modules accepted: Orders

## 2017-11-02 ENCOUNTER — Ambulatory Visit (INDEPENDENT_AMBULATORY_CARE_PROVIDER_SITE_OTHER): Payer: BLUE CROSS/BLUE SHIELD | Admitting: Physical Therapy

## 2017-11-02 ENCOUNTER — Encounter: Payer: Self-pay | Admitting: Physical Therapy

## 2017-11-02 DIAGNOSIS — M25551 Pain in right hip: Secondary | ICD-10-CM | POA: Diagnosis not present

## 2017-11-02 DIAGNOSIS — M25552 Pain in left hip: Secondary | ICD-10-CM

## 2017-11-02 DIAGNOSIS — R2689 Other abnormalities of gait and mobility: Secondary | ICD-10-CM

## 2017-11-02 NOTE — Therapy (Signed)
[facility] [address] [place], [place], [address] Phone: [phone]   Fax:  [phone]  Physical Therapy Treatment  Patient Details  Name: Danielle Rocha MRN: [id number] Date of Birth: Apr 19, 1951 Referring Provider: Durene Cal   Encounter Date: 11/02/2017  PT End of Session - 11/02/17 1638    Visit Number  5    Number of Visits  12    Date for PT Re-Evaluation  11/25/17    Authorization Type  BCBS/ Medicare $20    PT Start Time  1430    PT Stop Time  1518    PT Time Calculation (min)  48 min    Activity Tolerance  Patient tolerated treatment well    Behavior During Therapy  [facility] for tasks assessed/performed       Past Medical History:  Diagnosis Date  . Arthritis    DJD, low back, thumb  . Atrial fibrillation (HCC)    Xarelto anticoagulation. Flecainide antiarrythmic   . Chicken pox   . Chronic pain syndrome    On disability. History of bilateral hip pain, low back pain. Gabapentin 400 BID, percocet 1 tablet daily per prior provider.   . Colon polyp    awaiting records  . Depression    zoloft 100mg , remeron 30mg  per psychiatry. ambien 10mg  per psychiatry to help wtih sleep element.   . Dystonia    described as psychogenic dystonia. Pain and twisting from upper chest and up ith triggers "wind, creamy food" on TID ativan per psychiatry previously.   . Fibromyalgia   . GERD (gastroesophageal reflux disease)    omeprazole OTC  . Goiter    states multiple imaging tests, has had biopsies  . Hyperlipidemia    lovastatin 20mg   . Stroke [facility])    TIA (left side of face and body decreased sensitivity than right face and side)  . TIA (transient ischemic attack)   . Vaginal atrophy    estrace vaginal cream    Past Surgical History:  Procedure Laterality Date  . ATRIAL FIBRILLATION ABLATION    . BIOPSY THYROID    . fusion l4-l5  2000  . LAMINECTOMY     L4-L5  . OTHER SURGICAL HISTORY     tennis elbow surgery  . piriformis  release  1999  . TONSILLECTOMY AND ADENOIDECTOMY  age 67  . VAGINAL HYSTERECTOMY  1993    There were no vitals filed for this visit.  Subjective Assessment - 11/02/17 1636    Subjective  Pt with pain today, slightly increased. She notes no change, but continues to have pain when doing exercises.     Currently in Pain?  Yes    Pain Score  7     Pain Location  Hip    Pain Orientation  Left;Right    Pain Descriptors / Indicators  Tender;Sore    Pain Type  Chronic pain    Pain Onset  More than a month ago    Pain Frequency  Constant                      [facility] Adult PT Treatment/Exercise - 11/02/17 1435      Exercises   Exercises  Knee/Hip;Lumbar      Lumbar Exercises: Stretches   Lower Trunk Rotation Limitations  x10    Other Lumbar Stretch Exercise  Seated FWD flexoin 30 sec x3      Lumbar Exercises: Aerobic   Stationary Bike  L1 x  Knee/Hip Exercises: Stretches   Active Hamstring Stretch  30 seconds;3 reps    Active Hamstring Stretch Limitations  seated    Piriformis Stretch  3 reps;30 seconds    Piriformis Stretch Limitations  mod fig 4 pos/supine    Other Knee/Hip Stretches  SKTC 30 sec x3; Hip ER clam stretch /supine 10 sec x 5      Knee/Hip Exercises: Standing   Other Standing Knee Exercises  Stairs: up/down 3 steps x5 times, no hand rails, recipricol pattern     Other Standing Knee Exercises  Toe taps x20;  Tandem stance 30 sec Bil; with head turns x20;  lateral stepping with weight shifts x10 bil      Knee/Hip Exercises: Seated   Long Arc Quad  Both;20 reps      Knee/Hip Exercises: Supine   Hip Adduction Isometric  20 reps    Hip Adduction Isometric Limitations  Ball Squeeze    Other Supine Knee/Hip Exercises  --    Other Supine Knee/Hip Exercises  --      Modalities   Modalities  --      Moist Heat Therapy   Moist Heat Location  --      Electrical Stimulation   Electrical Stimulation Location  --    Electrical Stimulation Goals   --      Manual Therapy   Manual Therapy  Passive ROM;Myofascial release    Myofascial Release  Myofascial Ball roll (light) to distal ITB     Passive ROM  Passive stretching in supine for all hip motions; Long Leg distraction bil              PT Education - 11/02/17 1637    Education provided  Yes    Education Details  HEP , balance, safety at home with stairs and ambulation     Person(s) Educated  Patient    Methods  Explanation    Comprehension  Verbalized understanding       PT Short Term Goals - 10/14/17 1313      PT SHORT TERM GOAL #1   Title  Pt to be independent with initial HEP    Time  2    Period  Weeks    Status  New    Target Date  10/28/17      PT SHORT TERM GOAL #2   Title  Pt to report decreased pain in B hips to 4/10 with activity     Time  2    Period  Weeks    Status  New    Target Date  10/28/17        PT Long Term Goals - 10/14/17 1313      PT LONG TERM GOAL #1   Title  Pt to demo Bil Hip ROM to be WNL, to improve ability for squatting and stooping    Time  6    Period  Weeks    Status  New    Target Date  11/25/17      PT LONG TERM GOAL #2   Title  Pt to demo increased LE strength to at least 4+/5 to improve gait, balance, and ability to transfer from floor to standing position.     Time  6    Period  Weeks    Status  New    Target Date  11/25/17      PT LONG TERM GOAL #3   Title  Pt to demo ability for safe ambulation and  stair navigation, with balance WFL,  to decrease risk for falls     Time  6    Period  Weeks    Status  New    Target Date  11/25/17            Plan - 11/02/17 1639    Clinical Impression Statement  Balance training added today, pt challenged with most activities. She does not demonstrate poor safety on stairs, but reports that she has fallen up and down several times. She reports increased pain with most all activities done, AROM and stretching. Progression of strengthening has not been performed, due  to continued pain levels. Discussed de-sensitization to lateral hips with pt today, for HEP. She has significant tenderness even to light touch of skin, but does not demonstrate significant tightness of ITB or ROM loss in this area.     Rehab Potential  Good    PT Frequency  2x / week    PT Duration  6 weeks    PT Treatment/Interventions  ADLs/Self Care Home Management;Cryotherapy;Electrical Stimulation;Moist Heat;Therapeutic exercise;Therapeutic activities;Functional mobility training;Stair training;Gait training;Ultrasound;Balance training;Neuromuscular re-education;Patient/family education;Manual techniques;Taping;Dry needling;Passive range of motion    Consulted and Agree with Plan of Care  Patient       Patient will benefit from skilled therapeutic intervention in order to improve the following deficits and impairments:  Abnormal gait, Decreased endurance, Hypomobility, Decreased activity tolerance, Decreased strength, Pain, Decreased balance, Difficulty walking, Decreased mobility, Decreased range of motion, Improper body mechanics, Impaired flexibility, Decreased safety awareness  Visit Diagnosis: Pain in left hip  Pain in right hip  Other abnormalities of gait and mobility     Problem List Patient Active Problem List   Diagnosis Date Noted  . UTI (urinary tract infection) 10/07/2016  . Major depressive disorder, recurrent severe without psychotic features (HCC) 09/02/2016  . GERD (gastroesophageal reflux disease) 01/30/2016  . History of SI/intentional drug overdose 11/22/2015  . Atrial fibrillation (HCC)   . Chronic pain syndrome   . Hyperlipidemia    Sedalia MutaLauren Becky Berberian, PT, DPT 4:47 PM  11/02/17    [facility] [address] [place], [place], [address] Phone: [phone]   Fax:  [phone]  Name: Danielle Rocha MRN: [id number] Date of Birth: 03/09/1951

## 2017-11-04 ENCOUNTER — Ambulatory Visit (INDEPENDENT_AMBULATORY_CARE_PROVIDER_SITE_OTHER): Payer: BLUE CROSS/BLUE SHIELD | Admitting: Physical Therapy

## 2017-11-04 DIAGNOSIS — M25551 Pain in right hip: Secondary | ICD-10-CM

## 2017-11-04 DIAGNOSIS — R2689 Other abnormalities of gait and mobility: Secondary | ICD-10-CM

## 2017-11-04 DIAGNOSIS — M25552 Pain in left hip: Secondary | ICD-10-CM

## 2017-11-04 NOTE — Therapy (Signed)
[facility] [address] [place], [place], [address] Phone: [phone]   Fax:  [phone]  Physical Therapy Treatment/Discharge  Patient Details  Name: Danielle Rocha MRN: [id number] Date of Birth: 04/28/1951 Referring Provider: Yong Channel   Encounter Date: 11/04/2017  PT End of Session - 11/04/17 1302    Visit Number  6    Number of Visits  12    Date for PT Re-Evaluation  11/25/17    Authorization Type  BCBS/ Medicare $20    PT Start Time  1216    PT Stop Time  1256    PT Time Calculation (min)  40 min    Activity Tolerance  Patient tolerated treatment well    Behavior During Therapy  [facility] for tasks assessed/performed       Past Medical History:  Diagnosis Date  . Arthritis    DJD, low back, thumb  . Atrial fibrillation (HCC)    Xarelto anticoagulation. Flecainide antiarrythmic   . Chicken pox   . Chronic pain syndrome    On disability. History of bilateral hip pain, low back pain. Gabapentin 400 BID, percocet 1 tablet daily per prior provider.   . Colon polyp    awaiting records  . Depression    zoloft 168m, remeron 348mper psychiatry. ambien 109mer psychiatry to help wtih sleep element.   . Dystonia    described as psychogenic dystonia. Pain and twisting from upper chest and up ith triggers "wind, creamy food" on TID ativan per psychiatry previously.   . Fibromyalgia   . GERD (gastroesophageal reflux disease)    omeprazole OTC  . Goiter    states multiple imaging tests, has had biopsies  . Hyperlipidemia    [facility]astatin 29m21m [facility] (left side of face and body decreased sensitivity than right face and side)  . TIA (transient ischemic attack)   . Vaginal atrophy    estrace vaginal cream    Past Surgical History:  Procedure Laterality Date  . ATRIAL FIBRILLATION ABLATION    . BIOPSY THYROID    . fusion l4-l5  2000  . LAMINECTOMY     L4-L5  . OTHER SURGICAL HISTORY     tennis elbow surgery  .  piriformis release  1999  . TONSILLECTOMY AND ADENOIDECTOMY  age 15  .46VAGINAL HYSTERECTOMY  1993    There were no vitals filed for this visit.  Subjective Assessment - 11/04/17 1301    Subjective  Pt states increased pain since last session.     Pain Score  8     Pain Location  Hip    Pain Orientation  Left;Right    Pain Descriptors / Indicators  Tender;Sore    Pain Type  Chronic pain    Pain Onset  More than a month ago    Pain Frequency  Constant                      [facility] Adult PT Treatment/Exercise - 11/04/17 1216      Exercises   Exercises  Knee/Hip;Lumbar      Lumbar Exercises: Stretches   Single Knee to Chest Stretch  3 reps;30 seconds    Lower Trunk Rotation Limitations  x10    Other Lumbar Stretch Exercise  Seated FWD flexion 30 sec x3      Lumbar Exercises: Aerobic   Stationary Bike  --      Knee/Hip Exercises: Stretches   Active  Hamstring Stretch  --    Active Hamstring Stretch Limitations  --    Piriformis Stretch  3 reps;30 seconds    Piriformis Stretch Limitations  mod fig 4 pos/supine    Other Knee/Hip Stretches  --      Knee/Hip Exercises: Standing   Other Standing Knee Exercises  --    Other Standing Knee Exercises   Tandem stance 30 sec Bil; with head turns x20;        Knee/Hip Exercises: Seated   Long Arc Quad  --      Knee/Hip Exercises: Supine   Hip Adduction Isometric  --    Hip Adduction Isometric Limitations  --      Manual Therapy   Manual Therapy  Passive ROM;Myofascial release    Myofascial Release  foam roll (light) to Bil ITB     Passive ROM  --             PT Education - 11/04/17 1301    Education provided  Yes    Education Details  HEP, education on aquatic ther ex, massage, TENS, and pain relief techniques.     Person(s) Educated  Patient    Methods  Explanation    Comprehension  Verbalized understanding       PT Short Term Goals - 11/04/17 1302      PT SHORT TERM GOAL #1   Title  Pt to be  independent with initial HEP    Status  Achieved      PT SHORT TERM GOAL #2   Title  Pt to report decreased pain in B hips to 4/10 with activity     Status  Not Met        PT Long Term Goals - 11/04/17 1303      PT LONG TERM GOAL #1   Title  Pt to demo Bil Hip ROM to be WNL, to improve ability for squatting and stooping    Status  Partially Met      PT LONG TERM GOAL #2   Title  Pt to demo increased LE strength to at least 4+/5 to improve gait, balance, and ability to transfer from floor to standing position.     Status  Partially Met      PT LONG TERM GOAL #3   Title  Pt to demo ability for safe ambulation and stair navigation, with balance WFL,  to decrease risk for falls     Status  Achieved            Plan - 11/04/17 1303    Clinical Impression Statement  Pt continues to state increased pain from activities and PT. She has been unable to progress ther ex to strengthening or higher level activity, due to pain with stretching and ROM. Balance activites done, and pt demonstrates stability to be [facility] with abulation and dynamic activity. Pt continues to have tenderness to light touch to entire hip, glute, and ITB regions. Discussed other pain relief options with pt, and recommended that she try aquatic environment for decreasing pain with activity. Pt in agreement with plan. She does have follow up with MD in near future to discuss other pain relief options as well. Will d/ c at this time due to unchanged pain levels.     Rehab Potential  Good    PT Frequency  2x / week    PT Duration  6 weeks    PT Treatment/Interventions  ADLs/Self Care Home Management;Cryotherapy;Electrical Stimulation;Moist Heat;Therapeutic exercise;Therapeutic activities;Functional  mobility training;Stair training;Gait training;Ultrasound;Balance training;Neuromuscular re-education;Patient/family education;Manual techniques;Taping;Dry needling;Passive range of motion    Consulted and Agree with Plan of Care   Patient       Patient will benefit from skilled therapeutic intervention in order to improve the following deficits and impairments:  Abnormal gait, Decreased endurance, Hypomobility, Decreased activity tolerance, Decreased strength, Pain, Decreased balance, Difficulty walking, Decreased mobility, Decreased range of motion, Improper body mechanics, Impaired flexibility, Decreased safety awareness  Visit Diagnosis: Pain in left hip  Pain in right hip  Other abnormalities of gait and mobility     Problem List Patient Active Problem List   Diagnosis Date Noted  . UTI (urinary tract infection) 10/07/2016  . Major depressive disorder, recurrent severe without psychotic features ([place]) 09/02/2016  . GERD (gastroesophageal reflux disease) 01/30/2016  . History of SI/intentional drug overdose 11/22/2015  . Atrial fibrillation ([place])   . Chronic pain syndrome   . Hyperlipidemia     Lyndee Hensen, PT, DPT 1:09 PM  11/04/17    [facility] [place], [place], [address] Phone: [phone]   Fax:  [phone]  Name: Lissete Maestas MRN: [id number] Date of Birth: [birth year]   PHYSICAL THERAPY DISCHARGE SUMMARY  Visits from Start of Care: 6  Plan: Patient agrees to discharge.  Patient goals were partially met. Patient is being discharged due to lack of progress.  ?????    Lyndee Hensen, PT, DPT 1:10 PM  11/04/17

## 2017-11-09 ENCOUNTER — Telehealth: Payer: Self-pay | Admitting: Family Medicine

## 2017-11-09 NOTE — Telephone Encounter (Signed)
Please advise.   Copied from CRM [id number]. Topic: Quick Communication - See Telephone Encounter >> Nov 09, 2017  1:44 PM Elliot GaultBell, Tiffany M wrote: Relation to pt: self Call back number: [phone]   Reason for call:  Patient requesting HYDROcodone-acetaminophen (NORCO/VICODIN) 5-325 MG tablet refill, patient aware please allow 72 hour turn around, please advise  >> Nov 09, 2017  1:46 PM Elliot GaultBell, Tiffany M wrote: Relation to pt: self Call back number: [phone]   Reason for call:  Patient requesting HYDROcodone-acetaminophen (NORCO/VICODIN) 5-325 MG tablet refill, patient aware please allow 72 hour turn around, please advise

## 2017-11-11 NOTE — Telephone Encounter (Signed)
Pt states that she needs 14 pills to hold her over until her next refill.  Also, I did adv pt med refill request was stilling pending with PCP.

## 2017-11-11 NOTE — Telephone Encounter (Signed)
See note

## 2017-11-12 ENCOUNTER — Other Ambulatory Visit: Payer: Self-pay

## 2017-11-12 MED ORDER — HYDROCODONE-ACETAMINOPHEN 5-325 MG PO TABS: 0.5000 | ORAL_TABLET | Freq: Two times a day (BID) | ORAL | 0 refills | Status: DC | PRN

## 2017-11-12 NOTE — Telephone Encounter (Addendum)
°  Relation to pt: self Call back number: [phone] [facility] [address] - [place], [place] [id number] [phone] (Phone) [phone] (Fax)     Reason for call:  Patient states pharmacy cant read prescription, patient would like Rx resent, please advise

## 2017-11-12 NOTE — Telephone Encounter (Signed)
Prescription printed and faxed as requested

## 2017-11-16 NOTE — Telephone Encounter (Signed)
Called and spoke with pharmacist who confirmed they filled the prescription on 11/12/17 and patient picked it up

## 2017-12-10 ENCOUNTER — Other Ambulatory Visit: Payer: Self-pay | Admitting: Family Medicine

## 2018-01-25 ENCOUNTER — Encounter: Payer: Self-pay | Admitting: Family Medicine

## 2018-01-25 ENCOUNTER — Ambulatory Visit (INDEPENDENT_AMBULATORY_CARE_PROVIDER_SITE_OTHER): Payer: BLUE CROSS/BLUE SHIELD | Admitting: Family Medicine

## 2018-01-25 VITALS — BP 102/68 | HR 71 | Temp 97.7°F | Ht 67.0 in | Wt 164.4 lb

## 2018-01-25 DIAGNOSIS — G894 Chronic pain syndrome: Secondary | ICD-10-CM

## 2018-01-25 DIAGNOSIS — F332 Major depressive disorder, recurrent severe without psychotic features: Secondary | ICD-10-CM

## 2018-01-25 DIAGNOSIS — Z1231 Encounter for screening mammogram for malignant neoplasm of breast: Secondary | ICD-10-CM

## 2018-01-25 DIAGNOSIS — E785 Hyperlipidemia, unspecified: Secondary | ICD-10-CM

## 2018-01-25 DIAGNOSIS — I480 Paroxysmal atrial fibrillation: Secondary | ICD-10-CM

## 2018-01-25 DIAGNOSIS — Z1239 Encounter for other screening for malignant neoplasm of breast: Secondary | ICD-10-CM

## 2018-01-25 MED ORDER — HYDROCODONE-ACETAMINOPHEN 5-325 MG PO TABS: 0.5000 | ORAL_TABLET | Freq: Two times a day (BID) | ORAL | 0 refills | Status: DC | PRN

## 2018-01-25 NOTE — Patient Instructions (Addendum)
Health Maintenance Due  Topic Date Due  . MAMMOGRAM - call breast center to update your mammogram 10/30/2017   Refills for 3 months provided- go ahead and schedule 3 month follow up before you leave  Our team will check on status of your son Stephen's refill- ritalin

## 2018-01-25 NOTE — Assessment & Plan Note (Signed)
S: Depression is being managed by psychiatry.  She has had a very elevated PHQ 9 score.  The chronic pain really wears on her.sees psychiatry every 4-6 weeks now  No longer seeing counselor- with son missing there really is no fix to that portion.   She is currently on olanzapine 20 mg, trazodone 300 mg, gabapentin 300 mg at night A/P:  Continue current medicine and psychiatry follow up.

## 2018-01-25 NOTE — Assessment & Plan Note (Signed)
S: Patient saw cardiology in January of this year.  Dr. Tenny Craw was okay with primary care prescribing flecainide though she did mention one year cardiology follow-up potentially.  Patient is also on Xarelto for anticoagulation.  A/P: Continue current medications.

## 2018-01-25 NOTE — Progress Notes (Signed)
Subjective:  Danielle Rocha is a 67 y.o. year old very pleasant female patient who presents for/with See problem oriented charting ROS-no chest pain shortness of breath.  Does have chronic pain in areas described below.  No suicidal ideation  Past Medical History-  Patient Active Problem List   Diagnosis Date Noted  . Major depressive disorder, recurrent severe without psychotic features (HCC) 09/02/2016    Priority: High  . History of SI/intentional drug overdose 11/22/2015    Priority: High  . Atrial fibrillation (HCC)     Priority: High  . Chronic pain syndrome     Priority: High  . Hyperlipidemia     Priority: Medium  . GERD (gastroesophageal reflux disease) 01/30/2016    Priority: Low  . UTI (urinary tract infection) 10/07/2016    Medications- reviewed and updated Current Outpatient Medications  Medication Sig Dispense Refill  . OLANZapine (ZYPREXA) 20 MG tablet Take 20 mg by mouth at bedtime.    Marland Kitchen acetaminophen (TYLENOL) 500 MG tablet Take 1,000 mg by mouth every 6 (six) hours as needed for mild pain, moderate pain, fever or headache.    Marland Kitchen atorvastatin (LIPITOR) 20 MG tablet TAKE 1 TABLET BY MOUTH ONCE DAILY 90 tablet 2  . flecainide (TAMBOCOR) 100 MG tablet TAKE 1 TABLET BY MOUTH TWICE DAILY 180 tablet 3  . gabapentin (NEURONTIN) 300 MG capsule Take 300 mg by mouth at bedtime.    Marland Kitchen HYDROcodone-acetaminophen (NORCO/VICODIN) 5-325 MG tablet Take 0.5-1 tablets by mouth 2 (two) times daily as needed for moderate pain or severe pain (may fill in 1 month). 30 tablet 0  . HYDROcodone-acetaminophen (NORCO/VICODIN) 5-325 MG tablet Take 0.5-1 tablets by mouth 2 (two) times daily as needed for moderate pain or severe pain (may fill in 2 months). 30 tablet 0  . HYDROcodone-acetaminophen (NORCO/VICODIN) 5-325 MG tablet Take 0.5-1 tablets by mouth 2 (two) times daily as needed for moderate pain or severe pain (may fill today). 30 tablet 0  . Melatonin 3 MG TABS Take 2 tablets by mouth at  bedtime.     . Multiple Vitamin (MULTIVITAMIN WITH MINERALS) TABS tablet Take 1 tablet by mouth daily.    Marland Kitchen omeprazole (PRILOSEC) 20 MG capsule TAKE 1 CAPSULE BY MOUTH TWICE DAILY BEFORE A MEAL 60 capsule 6  . rivaroxaban (XARELTO) 20 MG TABS tablet Take 1 tablet (20 mg total) by mouth daily with supper. 90 tablet 3  . traZODone (DESYREL) 150 MG tablet Take 300 mg by mouth at bedtime.     No current facility-administered medications for this visit.     Objective: BP 102/68 (BP Location: Left Arm, Patient Position: Sitting, Cuff Size: Normal)   Pulse 71   Temp 97.7 F (36.5 C) (Oral)   Ht  (1.702 m)   Wt 164 lb 6.4 oz (74.6 kg)   SpO2 96%   BMI 25.75 kg/m  Gen: NAD, moves slowly to table CV: RRR no murmurs rubs or gallops Lungs: CTAB no crackles, wheeze, rhonchi Abdomen: soft/nontender/nondistended/normal bowel sounds.  Ext: no edema Skin: warm, dry Neuro: Normal gait and speech  Assessment/Plan:  Chronic pain syndrome S: Patient remains on disability for chronic pain.  See prior notes has had extensive orthopedic evaluation.  She even had back surgery without improvement.  She has had injections which seem to cause more pain than benefit.  She continues to have pain diffusely including in low back, bilateral hip and shoulders though the right side seems to be the worst.  We have tried  prednisone with very little benefit.  She feels like PT caused more pain than benefit as well.  We have had her on chronic hydrocodone half a tablet twice a day for for some time now.  We used to have her on a higher dosage but she had an attempted overdose for suicide.  She is living about 7/10 unless bed or recliner and then about 4/10. Gets about 1/10 improvement on the hydrocodone half tablet. She feels this improvement is worthwhile. If she takes a full tablet gets more improvement which helps her not miss out on social activities. Has a new zero gravity chair which has been helpful- allowed  her to get out on the beach with family when normally wouldn't have felt like it.   A/P: Pain remains imperfectly controlled.  She has continued 1 pill a day.  The risk with higher volumes would be related to suicide risk.  -Pain contract from 12/31/2015 needs to be updated for our new contracts -UDS 05/07/2017 and will complete yearly -[place]CCS[place] reviewed today-only getting prescriptions through [place].   Atrial fibrillation [facility]) S: Patient saw cardiology in January of this year.  Dr. Tenny Craw was okay with primary care prescribing flecainide though she did mention one year cardiology follow-up potentially.  Patient is also on Xarelto for anticoagulation.  A/P: Continue current medications.  Major depressive disorder, recurrent severe without psychotic features (HCC) S: Depression is being managed by psychiatry.  She has had a very elevated PHQ 9 score.  The chronic pain really wears on her.sees psychiatry every 4-6 weeks now  No longer seeing counselor- with son missing there really is no fix to that portion.   She is currently on olanzapine 20 mg, trazodone 300 mg, gabapentin 300 mg at night A/P:  Continue current medicine and psychiatry follow up.    Hyperlipidemia S:  controlled on atorvastatin   A/P: really good control with last LDL 75- continue current medicine   65-month follow-up  Lab/Order associations: Screening for breast cancer -we gave her a handout for breast center  Meds ordered this encounter  Medications  . HYDROcodone-acetaminophen (NORCO/VICODIN) 5-325 MG tablet    Sig: Take 0.5-1 tablets by mouth 2 (two) times daily as needed for moderate pain or severe pain (may fill in 1 month).    Dispense:  30 tablet    Refill:  0  . HYDROcodone-acetaminophen (NORCO/VICODIN) 5-325 MG tablet    Sig: Take 0.5-1 tablets by mouth 2 (two) times daily as needed for moderate pain or severe pain (may fill in 2 months).    Dispense:  30 tablet    Refill:  0  . HYDROcodone-acetaminophen  (NORCO/VICODIN) 5-325 MG tablet    Sig: Take 0.5-1 tablets by mouth 2 (two) times daily as needed for moderate pain or severe pain (may fill today).    Dispense:  30 tablet    Refill:  0    Return precautions advised.  Tana Conch, MD

## 2018-01-25 NOTE — Assessment & Plan Note (Signed)
S:  controlled on atorvastatin   A/P: really good control with last LDL 75- continue current medicine

## 2018-01-25 NOTE — Assessment & Plan Note (Signed)
S: Patient remains on disability for chronic pain.  See prior notes has had extensive orthopedic evaluation.  She even had back surgery without improvement.  She has had injections which seem to cause more pain than benefit.  She continues to have pain diffusely including in low back, bilateral hip and shoulders though the right side seems to be the worst.  We have tried prednisone with very little benefit.  She feels like PT caused more pain than benefit as well.  We have had her on chronic hydrocodone half a tablet twice a day for for some time now.  We used to have her on a higher dosage but she had an attempted overdose for suicide.  She is living about 7/10 unless bed or recliner and then about 4/10. Gets about 1/10 improvement on the hydrocodone half tablet. She feels this improvement is worthwhile. If she takes a full tablet gets more improvement which helps her not miss out on social activities. Has a new zero gravity chair which has been helpful- allowed her to get out on the beach with family when normally wouldn't have felt like it.   A/P: Pain remains imperfectly controlled.  She has continued 1 pill a day.  The risk with higher volumes would be related to suicide risk.  -Pain contract from 12/31/2015 needs to be updated for our new contracts -UDS 05/07/2017 and will complete yearly -[place]CCS[place] reviewed today-only getting prescriptions through [place].

## 2018-01-26 ENCOUNTER — Ambulatory Visit: Payer: BLUE CROSS/BLUE SHIELD | Admitting: Family Medicine

## 2018-01-27 ENCOUNTER — Encounter: Payer: Self-pay | Admitting: Family Medicine

## 2018-01-27 ENCOUNTER — Telehealth: Payer: Self-pay | Admitting: Family Medicine

## 2018-01-27 NOTE — Telephone Encounter (Signed)
[facility] states that current prescription for X[facility]to is expired. [facility] request for refills were faxed to the office on 5/5 and 5/7. Pt is leaving to go out of town tomorrow at 10 am.  LOV: 01/25/18 [facility]

## 2018-01-27 NOTE — Telephone Encounter (Signed)
Copied from CRM [id number]. Topic: Quick Communication - Rx Refill/Question >> Jan 27, 2018 11:24 AM Eston Mould B wrote: Medication:rivaroxaban (XARELTO) 20 MG TABS tablet PT is going out of town tomorrow.    Has the patient contacted their pharmacy? Yes  (Agent: If no, request that the patient contact the pharmacy for the refill.)  Preferred Pharmacy (with phone number or street name):[facility] [address] [place], [place] - [address] [phone] (Phone) [phone] (Fax)     Agent: Please be advised that RX refills may take up to 3 business days. We ask that you follow-up with your pharmacy.

## 2018-01-27 NOTE — Telephone Encounter (Signed)
Pt called to check on status, pt is leaving @ 10am

## 2018-01-28 ENCOUNTER — Other Ambulatory Visit: Payer: Self-pay

## 2018-01-28 MED ORDER — RIVAROXABAN 20 MG PO TABS: 20.0000 mg | ORAL_TABLET | Freq: Every day | ORAL | 3 refills | Status: DC

## 2018-01-28 NOTE — Telephone Encounter (Signed)
Prescription was filled this morning

## 2018-03-08 ENCOUNTER — Other Ambulatory Visit: Payer: Self-pay | Admitting: Family Medicine

## 2018-03-08 DIAGNOSIS — Z1231 Encounter for screening mammogram for malignant neoplasm of breast: Secondary | ICD-10-CM

## 2018-03-11 ENCOUNTER — Ambulatory Visit: Payer: Self-pay

## 2018-03-24 ENCOUNTER — Ambulatory Visit
Admission: RE | Admit: 2018-03-24 | Discharge: 2018-03-24 | Disposition: A | Discharge status: Home / Self Care | Payer: BLUE CROSS/BLUE SHIELD | Source: Ambulatory Visit | Attending: Family Medicine | Admitting: Family Medicine

## 2018-03-24 DIAGNOSIS — Z1231 Encounter for screening mammogram for malignant neoplasm of breast: Secondary | ICD-10-CM

## 2018-04-26 ENCOUNTER — Encounter: Payer: Self-pay | Admitting: Family Medicine

## 2018-04-26 ENCOUNTER — Ambulatory Visit (INDEPENDENT_AMBULATORY_CARE_PROVIDER_SITE_OTHER): Payer: BLUE CROSS/BLUE SHIELD | Admitting: Family Medicine

## 2018-04-26 VITALS — BP 100/62 | HR 64 | Temp 98.3°F | Ht 67.0 in | Wt 165.6 lb

## 2018-04-26 DIAGNOSIS — G2581 Restless legs syndrome: Secondary | ICD-10-CM

## 2018-04-26 DIAGNOSIS — Z79899 Other long term (current) drug therapy: Secondary | ICD-10-CM

## 2018-04-26 DIAGNOSIS — G894 Chronic pain syndrome: Secondary | ICD-10-CM

## 2018-04-26 DIAGNOSIS — R350 Frequency of micturition: Secondary | ICD-10-CM | POA: Diagnosis not present

## 2018-04-26 MED ORDER — HYDROCODONE-ACETAMINOPHEN 5-325 MG PO TABS: 0.5000 | ORAL_TABLET | Freq: Two times a day (BID) | ORAL | 0 refills | Status: DC | PRN

## 2018-04-26 NOTE — Assessment & Plan Note (Addendum)
S: today is another really tough for her. Hips and low back in particular.  she remains on hydrocodone half tablet twice a day or one full tablet once a day somedays. Had been on higher doses but had suicide attempts in past- has increased pain on this amount but lowers suicide risk. She notes voice is hoarse when her pain goes up.   Also she is following with psychiatry regularly and depression well controlled. She is on every 3 months- has been more down lately- lower interest in things at house. No thoughts of self harm. She is going to try to bump up appointment due to poor control. Not seeing counselor right now.   A/P: unfortunately, Pain remains imperfectly controlled.  She has continued 1 pill a day.  The risk with higher volumes would be related to suicide risk.  -Pain contract 02/03/18 updated -UDS 05/07/2017 and will complete yearly- update today -NCCSRS reviewed today-only getting prescriptions through us.  -refills for hydrocodone one tablet daily- can use half tablet BID

## 2018-04-26 NOTE — Patient Instructions (Addendum)
Health Maintenance Due  Topic Date Due  . INFLUENZA VACCINE - would advise this in the fall 04/22/2018   I would also like for you to sign up for an annual wellness visit with one of our nurses, Cassie or Darl PikesSusan, who both specialize in the annual wellness visit. This is a free benefit under medicare that may help us find additional ways to help you. Some highlights are reviewing medications, lifestyle, and doing a dementia screen.  Please check with your pharmacy to see if they have the shingrix vaccine. If they do- please get this immunization and update us by phone call or mychart with dates you receive the vaccine  Please stop by lab before you go

## 2018-04-26 NOTE — Progress Notes (Signed)
Subjective:  Danielle Rocha is a 67 y.o. year old very pleasant female patient who presents for/with See problem oriented charting ROS- continued severe pain. Denies palpitations. No edema. No reported abdominal pain.    Past Medical History-  Patient Active Problem List   Diagnosis Date Noted  . Major depressive disorder, recurrent severe without psychotic features (HCC) 09/02/2016    Priority: High  . History of SI/intentional drug overdose 11/22/2015    Priority: High  . Atrial fibrillation (HCC)     Priority: High  . Chronic pain syndrome     Priority: High  . Hyperlipidemia     Priority: Medium  . GERD (gastroesophageal reflux disease) 01/30/2016    Priority: Low  . UTI (urinary tract infection) 10/07/2016    Medications- reviewed and updated Current Outpatient Medications  Medication Sig Dispense Refill  . acetaminophen (TYLENOL) 500 MG tablet Take 1,000 mg by mouth every 6 (six) hours as needed for mild pain, moderate pain, fever or headache.    Marland Kitchen. atorvastatin (LIPITOR) 20 MG tablet TAKE 1 TABLET BY MOUTH ONCE DAILY 90 tablet 2  . flecainide (TAMBOCOR) 100 MG tablet TAKE 1 TABLET BY MOUTH TWICE DAILY 180 tablet 3  . gabapentin (NEURONTIN) 300 MG capsule Take 300 mg by mouth at bedtime.    Marland Kitchen. HYDROcodone-acetaminophen (NORCO/VICODIN) 5-325 MG tablet Take 0.5-1 tablets by mouth 2 (two) times daily as needed for moderate pain or severe pain (may fill in 1 month). 30 tablet 0  . HYDROcodone-acetaminophen (NORCO/VICODIN) 5-325 MG tablet Take 0.5-1 tablets by mouth 2 (two) times daily as needed for moderate pain or severe pain (may fill in 2 months). 30 tablet 0  . HYDROcodone-acetaminophen (NORCO/VICODIN) 5-325 MG tablet Take 0.5-1 tablets by mouth 2 (two) times daily as needed for moderate pain or severe pain (may fill today). 30 tablet 0  . Melatonin 3 MG TABS Take 2 tablets by mouth at bedtime.     . Multiple Vitamin (MULTIVITAMIN WITH MINERALS) TABS tablet Take 1 tablet by  mouth daily.    Marland Kitchen. OLANZapine (ZYPREXA) 20 MG tablet Take 20 mg by mouth at bedtime.    Marland Kitchen. omeprazole (PRILOSEC) 20 MG capsule TAKE 1 CAPSULE BY MOUTH TWICE DAILY BEFORE A MEAL 60 capsule 6  . rivaroxaban (XARELTO) 20 MG TABS tablet Take 1 tablet (20 mg total) by mouth daily with supper. 90 tablet 3  . traZODone (DESYREL) 150 MG tablet Take 300 mg by mouth at bedtime.     No current facility-administered medications for this visit.     Objective: BP 100/62 (BP Location: Left Arm, Patient Position: Sitting, Cuff Size: Large)   Pulse 64   Temp 98.3 F (36.8 C) (Oral)   Ht 5\' 7"  (1.702 m)   Wt 165 lb 9.6 oz (75.1 kg)   SpO2 96%   BMI 25.94 kg/m  Gen: NAD, resting comfortably CV: RRR no murmurs rubs or gallops Lungs: CTAB no crackles, wheeze, rhonchi Abdomen: soft/nontender/nondistended/normal bowel sounds.  Ext: no edema Skin: warm, dry Psych: down mood  Assessment/Plan:  Restless legs - Plan: CBC, Basic metabolic panel, Iron, TIBC and Ferritin Panel S: She has a sensation at night that her legs are going to cramp so continues to move them- eventually falls asleep. Sometimes can keep her up for a while A/P: no anemia on last check- will check cbc, electrolytes, b12, iron and ferritin. Hopeful ferritin at least above 75.   Urinary frequency - Plan: Urine Culture S: peeing twice a night for over  a month. Urine has a stronger odor. Some frequency as well. Symptoms stable. Nothing seems to help A/P: update urine culture today - has had UTI in past  Chronic pain syndrome S: today is another really tough for her. Hips and low back in particular.  she remains on hydrocodone half tablet twice a day or one full tablet once a day somedays. Had been on higher doses but had suicide attempts in past- has increased pain on this amount but lowers suicide risk. She notes voice is hoarse when her pain goes up.   Also she is following with psychiatry regularly and depression well controlled. She is  on every 3 months- has been more down lately- lower interest in things at house. No thoughts of self harm. She is going to try to bump up appointment due to poor control. Not seeing counselor right now.   A/P: unfortunately, Pain remains imperfectly controlled.  She has continued 1 pill a day.  The risk with higher volumes would be related to suicide risk.  -Pain contract 02/03/18 updated -UDS 05/07/2017 and will complete yearly- update today -NCCSRS reviewed today-only getting prescriptions through [place].  -refills for hydrocodone one tablet daily- can use half tablet BID   Future Appointments  Date Time Provider Department Center  07/27/2018 10:15 AM Shelva Majestic, MD [facility]  07/29/2018  1:00 PM [facility]   Return in about 3 months (around 07/27/2018) for schedule before you leave for next refill.  Lab/Order associations: Restless legs - Plan: CBC, Basic metabolic panel, Iron, TIBC and Ferritin Panel  Chronic pain syndrome - Plan: Pain Mgmt, Profile 8 w/Conf, U  High risk medication use - Plan: Pain Mgmt, Profile 8 w/Conf, U, Vitamin B12  Urinary frequency - Plan: Urine Culture  Meds ordered this encounter  Medications  . HYDROcodone-acetaminophen (NORCO/VICODIN) 5-325 MG tablet    Sig: Take 0.5-1 tablets by mouth 2 (two) times daily as needed for moderate pain or severe pain (may fill in 1 month).    Dispense:  30 tablet    Refill:  0  . HYDROcodone-acetaminophen (NORCO/VICODIN) 5-325 MG tablet    Sig: Take 0.5-1 tablets by mouth 2 (two) times daily as needed for moderate pain or severe pain (may fill in 2 months).    Dispense:  30 tablet    Refill:  0  . HYDROcodone-acetaminophen (NORCO/VICODIN) 5-325 MG tablet    Sig: Take 0.5-1 tablets by mouth 2 (two) times daily as needed for moderate pain or severe pain (may fill today).    Dispense:  30 tablet    Refill:  0    Return precautions advised.  Tana Conch, MD

## 2018-04-27 LAB — CBC
HCT: 40.5 % (ref 36.0–46.0)
Hemoglobin: 14.1 g/dL (ref 12.0–15.0)
MCHC: 34.8 g/dL (ref 30.0–36.0)
MCV: 87.5 fl (ref 78.0–100.0)
Platelets: 208 10*3/uL (ref 150.0–400.0)
RBC: 4.63 Mil/uL (ref 3.87–5.11)
RDW: 13 % (ref 11.5–15.5)
WBC: 7.1 10*3/uL (ref 4.0–10.5)

## 2018-04-27 LAB — BASIC METABOLIC PANEL
BUN: 10 mg/dL (ref 6–23)
CALCIUM: 9.6 mg/dL (ref 8.4–10.5)
CO2: 28 mEq/L (ref 19–32)
Chloride: 106 mEq/L (ref 96–112)
Creatinine, Ser: 0.86 mg/dL (ref 0.40–1.20)
GFR: 70.17 mL/min (ref >60.00)
Glucose, Bld: 140 mg/dL — ABNORMAL HIGH (ref 70–99)
Potassium: 4 mEq/L (ref 3.5–5.1)
SODIUM: 142 meq/L (ref 135–145)

## 2018-04-27 LAB — VITAMIN B12: Vitamin B-12: 268 pg/mL (ref 211–911)

## 2018-04-27 LAB — IRON,TIBC AND FERRITIN PANEL
%SAT: 18 % (ref 16–45)
FERRITIN: 19 ng/mL (ref 16–288)
IRON: 52 ug/dL (ref 45–160)
TIBC: 287 mcg/dL (calc) (ref 250–450)

## 2018-04-28 LAB — URINE CULTURE
MICRO NUMBER:: 90922873
SPECIMEN QUALITY: ADEQUATE

## 2018-04-29 ENCOUNTER — Telehealth: Payer: Self-pay | Admitting: Family Medicine

## 2018-04-29 ENCOUNTER — Other Ambulatory Visit: Payer: Self-pay

## 2018-04-29 ENCOUNTER — Encounter: Payer: Self-pay | Admitting: Family Medicine

## 2018-04-29 MED ORDER — CEPHALEXIN 500 MG PO CAPS: 500.0000 mg | ORAL_CAPSULE | Freq: Three times a day (TID) | ORAL | 0 refills | Status: AC

## 2018-04-29 NOTE — Telephone Encounter (Signed)
Called patient and let her know that prescription had been sent to the pharmacy. Asked that she notify us if they did not let her know prescription is ready today. She verbalized understanding

## 2018-04-29 NOTE — Telephone Encounter (Signed)
Copied from CRM [id number]. Topic: Quick Communication - Rx Refill/Question >> Apr 29, 2018 12:56 PM Lyn HollingsheadAlexander, [facility] wrote: Medication:  cephalexin 500mg  to be taken 3x a day for 7 days #21.   Pt states she received a message via mychart that her urine results were abnormal and cephalexin 500mg  to be taken 3x a day for 7 days #21 was to be called in to her pharmacy.  Pt states there is no medication there to be picked up.  [facility] uses [facility] [address]- [place], [place] - [address] [phone] (Phone) [phone] (Fax)

## 2018-04-29 NOTE — Telephone Encounter (Signed)
See note

## 2018-04-30 LAB — PAIN MGMT, PROFILE 8 W/CONF, U
6 Acetylmorphine: NEGATIVE ng/mL (ref <10)
Alcohol Metabolites: NEGATIVE ng/mL (ref <500)
Amphetamines: NEGATIVE ng/mL (ref <500)
BENZODIAZEPINES: NEGATIVE ng/mL (ref <100)
BUPRENORPHINE, URINE: NEGATIVE ng/mL (ref <5)
COCAINE METABOLITE: NEGATIVE ng/mL (ref <150)
Codeine: NEGATIVE ng/mL (ref <50)
Creatinine: 61.3 mg/dL
HYDROCODONE: 336 ng/mL — AB (ref <50)
HYDROMORPHONE: NEGATIVE ng/mL (ref <50)
MARIJUANA METABOLITE: NEGATIVE ng/mL (ref <20)
MDMA: NEGATIVE ng/mL (ref <500)
Morphine: NEGATIVE ng/mL (ref <50)
Norhydrocodone: 431 ng/mL — ABNORMAL HIGH (ref <50)
Opiates: POSITIVE ng/mL — AB (ref <100)
Oxidant: NEGATIVE ug/mL (ref <200)
Oxycodone: NEGATIVE ng/mL (ref <100)
pH: 5.9 (ref 4.5–9.0)

## 2018-06-14 ENCOUNTER — Other Ambulatory Visit: Payer: Self-pay | Admitting: Family Medicine

## 2018-06-21 ENCOUNTER — Encounter: Payer: Self-pay | Admitting: Family Medicine

## 2018-06-28 ENCOUNTER — Other Ambulatory Visit: Payer: Self-pay | Admitting: Family Medicine

## 2018-06-28 ENCOUNTER — Encounter: Payer: Self-pay | Admitting: Family Medicine

## 2018-06-29 ENCOUNTER — Other Ambulatory Visit: Payer: Self-pay

## 2018-06-29 ENCOUNTER — Encounter: Payer: Self-pay | Admitting: Family Medicine

## 2018-06-29 MED ORDER — OMEPRAZOLE 20 MG PO CPDR: DELAYED_RELEASE_CAPSULE | ORAL | 6 refills | Status: DC

## 2018-07-16 ENCOUNTER — Encounter: Payer: Self-pay | Admitting: Family Medicine

## 2018-07-27 ENCOUNTER — Ambulatory Visit (INDEPENDENT_AMBULATORY_CARE_PROVIDER_SITE_OTHER): Payer: BLUE CROSS/BLUE SHIELD | Admitting: Family Medicine

## 2018-07-27 ENCOUNTER — Encounter: Payer: Self-pay | Admitting: Family Medicine

## 2018-07-27 VITALS — BP 100/68 | HR 69 | Temp 97.7°F | Ht 67.0 in | Wt 165.4 lb

## 2018-07-27 DIAGNOSIS — G894 Chronic pain syndrome: Secondary | ICD-10-CM | POA: Diagnosis not present

## 2018-07-27 DIAGNOSIS — E663 Overweight: Secondary | ICD-10-CM

## 2018-07-27 DIAGNOSIS — I480 Paroxysmal atrial fibrillation: Secondary | ICD-10-CM

## 2018-07-27 DIAGNOSIS — R3915 Urgency of urination: Secondary | ICD-10-CM | POA: Diagnosis not present

## 2018-07-27 DIAGNOSIS — G2581 Restless legs syndrome: Secondary | ICD-10-CM | POA: Diagnosis not present

## 2018-07-27 MED ORDER — HYDROCODONE-ACETAMINOPHEN 5-325 MG PO TABS: 0.5000 | ORAL_TABLET | Freq: Two times a day (BID) | ORAL | 0 refills | Status: DC | PRN

## 2018-07-27 NOTE — Progress Notes (Signed)
Subjective:  Danielle Rocha is a 67 y.o. year old very pleasant female patient who presents for/with See problem oriented charting ROS-continued low back pain.  Continued hip pain.  No chest pain shortness of breath reported.  Does have some thoughts of being better off dead but has no plan.  Past Medical History-  Patient Active Problem List   Diagnosis Date Noted  . Major depressive disorder, recurrent severe without psychotic features (HCC) 09/02/2016    Priority: High  . History of SI/intentional drug overdose 11/22/2015    Priority: High  . Atrial fibrillation (HCC)     Priority: High  . Chronic pain syndrome     Priority: High  . Hyperlipidemia     Priority: Medium  . GERD (gastroesophageal reflux disease) 01/30/2016    Priority: Low  . Restless legs 07/27/2018  . UTI (urinary tract infection) 10/07/2016    Medications- reviewed and updated Current Outpatient Medications  Medication Sig Dispense Refill  . acetaminophen (TYLENOL) 500 MG tablet Take 1,000 mg by mouth every 6 (six) hours as needed for mild pain, moderate pain, fever or headache.    Marland Kitchen atorvastatin (LIPITOR) 20 MG tablet TAKE 1 TABLET BY MOUTH ONCE DAILY 90 tablet 2  . cholecalciferol (VITAMIN D) 1000 units tablet Take 1,000 Units by mouth daily.    Marland Kitchen co-enzyme Q-10 30 MG capsule Take 30 mg by mouth daily.    . flecainide (TAMBOCOR) 100 MG tablet TAKE 1 TABLET BY MOUTH TWICE DAILY 180 tablet 3  . gabapentin (NEURONTIN) 300 MG capsule Take 300 mg by mouth at bedtime.    Marland Kitchen HYDROcodone-acetaminophen (NORCO/VICODIN) 5-325 MG tablet Take 0.5-1 tablets by mouth 2 (two) times daily as needed for moderate pain or severe pain (may fill in 1 month). 30 tablet 0  . HYDROcodone-acetaminophen (NORCO/VICODIN) 5-325 MG tablet Take 0.5-1 tablets by mouth 2 (two) times daily as needed for moderate pain or severe pain (may fill in 2 months). 30 tablet 0  . HYDROcodone-acetaminophen (NORCO/VICODIN) 5-325 MG tablet Take 0.5-1 tablets  by mouth 2 (two) times daily as needed for moderate pain or severe pain (may fill today). 30 tablet 0  . Melatonin 3 MG TABS Take 2 tablets by mouth at bedtime.     . Multiple Vitamin (MULTIVITAMIN WITH MINERALS) TABS tablet Take 1 tablet by mouth daily.    Marland Kitchen OLANZapine (ZYPREXA) 20 MG tablet Take 20 mg by mouth at bedtime.    Marland Kitchen omeprazole (PRILOSEC) 20 MG capsule TAKE 1 CAPSULE BY MOUTH TWICE DAILY BEFORE A MEAL 60 capsule 6  . rivaroxaban (XARELTO) 20 MG TABS tablet Take 1 tablet (20 mg total) by mouth daily with supper. 90 tablet 3  . traZODone (DESYREL) 150 MG tablet Take 300 mg by mouth at bedtime.     No current facility-administered medications for this visit.     Objective: BP 100/68 (BP Location: Left Arm, Patient Position: Sitting, Cuff Size: Large)   Pulse 69   Temp 97.7 F (36.5 C) (Oral)   Ht 5\' 7"  (1.702 m)   Wt 165 lb 6.4 oz (75 kg)   SpO2 96%   BMI 25.91 kg/m  Gen: NAD, resting comfortably CV: RRR (not irregular today) no murmurs rubs or gallops Lungs: CTAB no crackles, wheeze, rhonchi Abdomen: soft/nontender/nondistended/normal bowel sounds.  Ext: no edema Skin: warm, dry  Assessment/Plan:  Urinary urgency and odor  s: has noted urinary odor and urgency since last visit- gets better then gets worse. Treated with keflex in august. Last  UTI prior was November 2018. If this is true UTI would be 3rd episode in a year.  A/P: Suspect recurrent UTI-we will update urine culture.  Patient thinks symptoms are mild enough that she can wait on culture results.  If this is a true UTI would be second within 6 months.  If has any more could consider antibiotic prophylaxis  Restless legs S: Patient has a sensation at night that her legs are going to cramping so she continues to move them.  She is able to eventually fall asleep.  Last visit we detected her ferritin was low at well under 75.  We encouraged her to take ferrous sulfate 325 mg at least twice a week. She states this has  improved but not as long as not as bad- perhaps 3x a week A/P: continue iron- will update ferritin next blood work.   Chronic pain syndrome S: Patient continues to suffer from chronic pain.  Pain is in her hips and low back in particular. Seems to go down legs more the longer she is up during the day.   She is on hydrocodone 5- 320 5/2 tablet twice a day or 1 tablet once a day sometimes.  Previously she was on higher doses but had a suicide attempt including using her pain medications so we have wanted to limit total volume of pain medications. She did not find counseling helpful and has stopped that but continue psychiatry follow up   Extensive workup in the past without clear cause- no severe arthritis or anything. Has had MRIs in past and pain pattern largely stable so we have opted not to reinvestigate.   In regards to depression history-she continues to follow with psychiatry every 3 months. A/P: Chronic pain-continues imperfectly controlled.  She is on one hydrocodone total per day.  Higher volumes of hydrocodone would be riskier due to history of suicide attempt.  In addition- would also place her risk for ever escalating need for medication. -Pain contract 02/03/2018 was updated - UDS 04/26/2018 - nccsrs reviewed today-only getting prescriptions through our office -Refills for 3 months of hydrocodone 5/325 given -has had some thoughts of being better off dead but has had no plan to move forward with this. She does have plan to call husband or brother or sister if thoughts became more intrusive. Psychiatry is aware of this and working with patient.   Atrial fibrillation (HCC) S: Remains on Xarelto as anticoagulation and flecainide as an antiarrhythmic A/P: Stable-continue current medications   Future Appointments  Date Time Provider Department Center  07/29/2018  1:00 PM [facility]   Return in about 3 months (around 10/27/2018) for follow up- or sooner if needed. may  schedule as physical if youd like. . Likely give final Pneumovax 23 at that time  Meds ordered this encounter  Medications  . HYDROcodone-acetaminophen (NORCO/VICODIN) 5-325 MG tablet    Sig: Take 0.5-1 tablets by mouth 2 (two) times daily as needed for moderate pain or severe pain (may fill in 1 month).    Dispense:  30 tablet    Refill:  0  . HYDROcodone-acetaminophen (NORCO/VICODIN) 5-325 MG tablet    Sig: Take 0.5-1 tablets by mouth 2 (two) times daily as needed for moderate pain or severe pain (may fill in 2 months).    Dispense:  30 tablet    Refill:  0  . HYDROcodone-acetaminophen (NORCO/VICODIN) 5-325 MG tablet    Sig: Take 0.5-1 tablets by mouth 2 (two) times daily as  needed for moderate pain or severe pain (may fill today).    Dispense:  30 tablet    Refill:  0    Return precautions advised.  Tana Conch, MD

## 2018-07-27 NOTE — Assessment & Plan Note (Signed)
S: Remains on Xarelto as anticoagulation and flecainide as an antiarrhythmic A/P: Stable-continue current medications

## 2018-07-27 NOTE — Assessment & Plan Note (Signed)
S: Patient continues to suffer from chronic pain.  Pain is in her hips and low back in particular. Seems to go down legs more the longer she is up during the day.   She is on hydrocodone 5- 320 5/2 tablet twice a day or 1 tablet once a day sometimes.  Previously she was on higher doses but had a suicide attempt including using her pain medications so we have wanted to limit total volume of pain medications. She did not find counseling helpful and has stopped that but continue psychiatry follow up   Extensive workup in the past without clear cause- no severe arthritis or anything. Has had MRIs in past and pain pattern largely stable so we have opted not to reinvestigate.   In regards to depression history-she continues to follow with psychiatry every 3 months. A/P: Chronic pain-continues imperfectly controlled.  She is on one hydrocodone total per day.  Higher volumes of hydrocodone would be riskier due to history of suicide attempt.  In addition- would also place her risk for ever escalating need for medication. -Pain contract 02/03/2018 was updated - UDS 04/26/2018 - nccsrs reviewed today-only getting prescriptions through our office -Refills for 3 months of hydrocodone 5/325 given -has had some thoughts of being better off dead but has had no plan to move forward with this. She does have plan to call husband or brother or sister if thoughts became more intrusive. Psychiatry is aware of this and working with patient.

## 2018-07-27 NOTE — Assessment & Plan Note (Signed)
S: Patient has a sensation at night that her legs are going to cramping so she continues to move them.  She is able to eventually fall asleep.  Last visit we detected her ferritin was low at well under 75.  We encouraged her to take ferrous sulfate 325 mg at least twice a week. She states this has improved but not as long as not as bad- perhaps 3x a week A/P: continue iron- will update ferritin next blood work.

## 2018-07-27 NOTE — Patient Instructions (Addendum)
Health Maintenance Due  Topic Date Due  . PNA vac Low Risk Adult (2 of 2 - PPSV23)- will do with next visit, thanks for doing your flu shot!  04/27/2018   Refilled pain medicine for 3 months- see you back at that time  Please stop by lab before you go- to get urine only   Thanks for taking the iron- glad that is helping

## 2018-07-28 NOTE — Progress Notes (Signed)
Subjective:   Danielle Rocha is a 67 y.o. female who presents for an Initial Medicare Annual Wellness Visit.  Reports health as Hx stroke / TIA  Chronic pain and followed q 3 months  2 sons with her now  Corliss Parish has down's syndrome; he is an Agricultural consultant  1 is missing  Spouse is helpful and supportive   Diet BMI 26  Breakfast yogurt or grapes, decaf coffee Lunch sandwich Supper full meal with meat and vegetables  Does not eat many sweets    Exercise Barriers pain in back and hips  Activity makes it worse and better when lying down Has been evaluated at [facility]  piriformis release - thought this would correct her pain on the right- now has spinal stenosis  Pain is level 5 to 6 all the time   There are no preventive care reminders to display for this patient.  Colonoscopy 09/2013 - due again in 10 years  Breast 03/2018 Bone Density 10/2015 - normal    Educated regarding the shingrix   Will wait x 4 weeks to take her PSV 23 post flu vaccine.        Objective:    Today's Vitals   07/29/18 1257  BP: 120/70  Pulse: 77  SpO2: 95%  Weight: 166 lb (75.3 kg)  Height: 5' 7.5" (1.715 m)   Body mass index is 25.62 kg/m.  Advanced Directives 07/29/2018 10/14/2017 09/01/2016 04/23/2016 11/22/2015 09/02/2015 08/04/2015  Does Patient Have a Medical Advance Directive? No No No No No No No  Would patient like information on creating a medical advance directive? - No - Patient declined No - Patient declined - No - patient declined information - No - patient declined information  Some encounter information is confidential and restricted. Go to Review Flowsheets activity to see all data.    Current Medications (verified) Outpatient Encounter Medications as of 07/29/2018  Medication Sig  . acetaminophen (TYLENOL) 500 MG tablet Take 1,000 mg by mouth every 6 (six) hours as needed for mild pain, moderate pain, fever or headache.  Marland Kitchen atorvastatin (LIPITOR) 20 MG tablet TAKE 1  TABLET BY MOUTH ONCE DAILY  . cholecalciferol (VITAMIN D) 1000 units tablet Take 1,000 Units by mouth daily.  Marland Kitchen co-enzyme Q-10 30 MG capsule Take 30 mg by mouth daily.  . flecainide (TAMBOCOR) 100 MG tablet TAKE 1 TABLET BY MOUTH TWICE DAILY  . gabapentin (NEURONTIN) 300 MG capsule Take 300 mg by mouth at bedtime.  Marland Kitchen HYDROcodone-acetaminophen (NORCO/VICODIN) 5-325 MG tablet Take 0.5-1 tablets by mouth 2 (two) times daily as needed for moderate pain or severe pain (may fill in 1 month).  Marland Kitchen HYDROcodone-acetaminophen (NORCO/VICODIN) 5-325 MG tablet Take 0.5-1 tablets by mouth 2 (two) times daily as needed for moderate pain or severe pain (may fill in 2 months).  Marland Kitchen HYDROcodone-acetaminophen (NORCO/VICODIN) 5-325 MG tablet Take 0.5-1 tablets by mouth 2 (two) times daily as needed for moderate pain or severe pain (may fill today).  . Melatonin 3 MG TABS Take 2 tablets by mouth at bedtime.   . Multiple Vitamin (MULTIVITAMIN WITH MINERALS) TABS tablet Take 1 tablet by mouth daily.  Marland Kitchen OLANZapine (ZYPREXA) 20 MG tablet Take 20 mg by mouth at bedtime.  Marland Kitchen omeprazole (PRILOSEC) 20 MG capsule TAKE 1 CAPSULE BY MOUTH TWICE DAILY BEFORE A MEAL  . rivaroxaban (XARELTO) 20 MG TABS tablet Take 1 tablet (20 mg total) by mouth daily with supper.  . traZODone (DESYREL) 150 MG tablet Take 300 mg by mouth  at bedtime.   No facility-administered encounter medications on file as of 07/29/2018.     Allergies (verified) Reglan [metoclopramide]; Ciprofloxacin; Clindamycin/lincomycin; Vibramycin [doxycycline]; Macrobid [nitrofurantoin monohyd macro]; and Sulfa antibiotics   History: Past Medical History:  Diagnosis Date  . Arthritis    DJD, low back, thumb  . Atrial fibrillation (HCC)    Xarelto anticoagulation. Flecainide antiarrythmic   . Chicken pox   . Chronic pain syndrome    On disability. History of bilateral hip pain, low back pain. Gabapentin 400 BID, percocet 1 tablet daily per prior provider.   . Colon  polyp    awaiting records  . Depression    zoloft 100mg , remeron 30mg  per psychiatry. ambien 10mg  per psychiatry to help wtih sleep element.   . Dystonia    described as psychogenic dystonia. Pain and twisting from upper chest and up ith triggers "wind, creamy food" on TID ativan per psychiatry previously.   . Fibromyalgia   . GERD (gastroesophageal reflux disease)    omeprazole OTC  . Goiter    states multiple imaging tests, has had biopsies  . Hyperlipidemia    lovastatin 20mg   . Stroke (HCC)    TIA (left side of face and body decreased sensitivity than right face and side)  . TIA (transient ischemic attack)   . Vaginal atrophy    estrace vaginal cream   Past Surgical History:  Procedure Laterality Date  . ATRIAL FIBRILLATION ABLATION    . BIOPSY THYROID    . fusion l4-l5  2000  . LAMINECTOMY     L4-L5  . OTHER SURGICAL HISTORY     tennis elbow surgery  . piriformis release  1999  . TONSILLECTOMY AND ADENOIDECTOMY  age 60  . VAGINAL HYSTERECTOMY  1993   Family History  Problem Relation Age of Onset  . Alcohol abuse Mother   . Hypertension Father   . Alcohol abuse Father   . Pancreatic cancer Father   . Cancer Brother        spindle cell RLE  . Cancer Brother        tonsil cancer  . Skin cancer Brother    Social History   Socioeconomic History  . Marital status: Married    Spouse name: Not on file  . Number of children: Not on file  . Years of education: Not on file  . Highest education level: Not on file  Occupational History  . Not on file  Social Needs  . Financial resource strain: Not on file  . Food insecurity:    Worry: Not on file    Inability: Not on file  . Transportation needs:    Medical: Not on file    Non-medical: Not on file  Tobacco Use  . Smoking status: Never Smoker  . Smokeless tobacco: Never Used  Substance and Sexual Activity  . Alcohol use: No    Alcohol/week: 0.0 standard drinks  . Drug use: No  . Sexual activity: Never     Partners: Male  Lifestyle  . Physical activity:    Days per week: Not on file    Minutes per session: Not on file  . Stress: Not on file  Relationships  . Social connections:    Talks on phone: Not on file    Gets together: Not on file    Attends religious service: Not on file    Active member of club or organization: Not on file    Attends meetings of clubs or organizations: Not  on file    Relationship status: Not on file  Other Topics Concern  . Not on file  Social History Narrative   Family: Married. Husband works as Hospital doctor for [employer]. 2 children from previous marriage 1 from current. Son Jeannett Senior lives with them and has down's syndrome.       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 Moved to [facility] from [phone] and became disabled in that time frame due to hip/back issues.       Hobbies: time with son    Tobacco Counseling Counseling given: Yes   Clinical Intake:     Activities of Daily Living In your present state of health, do you have any difficulty performing the following activities: 07/29/2018  Hearing? N  Vision? N  Difficulty concentrating or making decisions? N  Some recent data might be hidden     Immunizations and Health Maintenance Immunization History  Administered Date(s) Administered  . Influenza, High Dose Seasonal PF 07/29/2017  . Influenza-Unspecified 06/09/2013, 06/06/2014, 07/04/2015, 07/26/2016, 07/26/2018  . Pneumococcal Conjugate-13 04/27/2017  . Pneumococcal Polysaccharide-23 09/23/2012  . Td 09/23/2012  . Tdap 06/13/2012  . Zoster 08/16/2012   There are no preventive care reminders to display for this patient.  Patient Care Team: Shelva Majestic, MD as PCP - General (Family Medicine)  Indicate any recent Medical Services you may have received from other than [facility] providers in the past year (date may be approximate).     Assessment:   This is a routine wellness examination for  [place].  Hearing/Vision screen  Hearing Screening   125Hz  250Hz  500Hz  1000Hz  2000Hz  3000Hz  4000Hz  6000Hz  8000Hz   Right ear:       100    Left ear:       100     Comments: Hearing issues  Has tinnitus all the time You can have a hearing test anywhere   Vision Screening Comments: Regular eye checks [facility] (spouse works for [employer])  No issues  Dietary issues and exercise activities discussed:    Goals    . Patient Stated     Like some communication from her oldest son       Depression Screen PHQ 2/9 Scores 07/27/2018 04/26/2018 10/28/2017 04/27/2017  PHQ - 2 Score 3 5 6 1   PHQ- 9 Score 10 16 20  -    Sees a NP for counseling  Do a celebration of  Life   Fall Risk Fall Risk  07/29/2018 04/27/2017  Falls in the past year? 0 Yes  Comment fell a while back walking down stairs to the driveway ; bruised eye and laceration on forehead. pushed teeth ; broke wrist  -  Number falls in past yr: 0 1  Injury with Fall? 1 Yes  Risk for fall due to : Impaired balance/gait (No Data)  Risk for fall due to: Comment - Tripped down stairs  Follow up Education provided -      Cognitive Function:     Ad8 score reviewed for issues:  Issues making decisions:  Less interest in hobbies / activities:  Repeats questions, stories (family complaining):  Trouble using ordinary gadgets (microwave, computer, phone):  Forgets the month or year:   Mismanaging finances:   Remembering appts:  Daily problems with thinking and/or memory: Ad8 score is=0     6CIT Screen 07/29/2018  What Year? 0 points  What month? 0 points  What time? 0 points  Count back from 20 0 points  Months in reverse 0  points  Repeat phrase 0 points  Total Score 0    Screening Tests Health Maintenance  Topic Date Due  . PNA vac Low Risk Adult (2 of 2 - PPSV23) 08/28/2018 (Originally 04/27/2018)  . Hepatitis C Screening  06/22/2055 (Originally [birth year])  . MAMMOGRAM  03/24/2020  . TETANUS/TDAP  09/23/2022  .  COLONOSCOPY  10/19/2023  . INFLUENZA VACCINE  Completed  . DEXA SCAN  Completed           Plan:      PCP Notes   Health Maintenance Colonoscopy 09/2013 - due again in 10 years  Breast 03/2018 Bone Density 10/2015 - normal    Educated regarding the shingrix   Will wait x 4 weeks to take her PSV 23 post flu vaccine.  Abnormal Screens  Depression scale checked x 2 days ago. Spent time discussing "lost son" and seeing Nurse Practitioner for ongoing counseling. Smiled some today; affect flat but better during the assessment.   Referrals  none  Patient concerns; Recent UTI on Tuesday of this week  Preliminary culture back. Will fup with culture back most likely tomorrow   Nurse Concerns; As noted  Next PCP apt 10/2018 Frequent visits due to pain level Described as 5 to 6 all the time     I have personally reviewed and noted the following in the patient's chart:   . Medical and social history . Use of alcohol, tobacco or illicit drugs  . Current medications and supplements . Functional ability and status . Nutritional status . Physical activity . Advanced directives . List of other physicians . Hospitalizations, surgeries, and ER visits in previous 12 months . Vitals . Screenings to include cognitive, depression, and falls . Referrals and appointments  In addition, I have reviewed and discussed with patient certain preventive protocols, quality metrics, and best practice recommendations. A written personalized care plan for preventive services as well as general preventive health recommendations were provided to patient.     Montine Circle, RN   07/29/2018

## 2018-07-29 ENCOUNTER — Ambulatory Visit (INDEPENDENT_AMBULATORY_CARE_PROVIDER_SITE_OTHER): Payer: BLUE CROSS/BLUE SHIELD

## 2018-07-29 VITALS — BP 120/70 | HR 77 | Ht 67.5 in | Wt 166.0 lb

## 2018-07-29 DIAGNOSIS — Z0000 Encounter for general adult medical examination without abnormal findings: Secondary | ICD-10-CM | POA: Diagnosis not present

## 2018-07-29 LAB — URINE CULTURE
MICRO NUMBER: 91330666
SPECIMEN QUALITY: ADEQUATE

## 2018-07-29 MED ORDER — CEPHALEXIN 500 MG PO CAPS: 500.0000 mg | ORAL_CAPSULE | Freq: Four times a day (QID) | ORAL | 0 refills | Status: AC

## 2018-07-29 NOTE — Patient Instructions (Addendum)
Ms. Copen , Thank you for taking time to come for your Medicare Wellness Visit. I appreciate your ongoing commitment to your health goals. Please review the following plan we discussed and let me know if I can assist you in the future.   The Centers for Disease Control are now recommending 2 pneumonia vaccinations after 13. The first is the Prevnar 13. This helps to boost your immunity to community acquired pneumonia as well as some protection from bacterial pneumonia  The 2nd is the pneumovax 23, which offers more broad protection!  Please consider taking these as this is your best protection against pneumonia. You need another pneumonia vaccine (23) and you can make a nurse visit or just get it when you come back in . CDC ask you to wait approx 4 weeks from your prior flu vaccine   Shingrix is a vaccine for the prevention of Shingles in Adults 50 and older.  If you are on Medicare, the shingrix is covered under your Part D plan, so you will take both of the vaccines in the series at your pharmacy. Please check with your benefits regarding applicable copays or out of pocket expenses.  The Shingrix is given in 2 vaccines approx 8 weeks apart. You must receive the 2nd dose prior to 6 months from receipt of the first. Please have the pharmacist print out you Immunization  dates for our office records   A hearing screen is free Deaf & Hard of Hearing Division Services - can assist with hearing aid x 1  No reviews  [place]  [facility] [phone]  [phone]  http://[email or web address].acquia-[email or web address]/sites/default/files/hearingpedia/Guide_How_to_Buy_Hearing_Aids.pdf   These are the goals we discussed: Goals    . Patient Stated     Like some communication from her oldest son        This is a list of the screening recommended for you and due dates:  Health Maintenance  Topic Date Due  . Pneumonia vaccines (2 of 2 - PPSV23) 04/27/2018  .  Hepatitis C: One time  screening is recommended by Center for Disease Control  (CDC) for  adults born from [birth year] through 1965.   06/22/2056*  . Mammogram  03/24/2020  . Tetanus Vaccine  09/23/2022  . Colon Cancer Screening  10/19/2023  . Flu Shot  Completed  . DEXA scan (bone density measurement)  Completed  *Topic was postponed. The date shown is not the original due date.      Fall Prevention in the Home Falls can cause injuries. They can happen to people of all ages. There are many things you can do to make your home safe and to help prevent falls. What can I do on the outside of my home?  Regularly fix the edges of walkways and driveways and fix any cracks.  Remove anything that might make you trip as you walk through a door, such as a raised step or threshold.  Trim any bushes or trees on the path to your home.  Use bright outdoor lighting.  Clear any walking paths of anything that might make someone trip, such as rocks or tools.  Regularly check to see if handrails are loose or broken. Make sure that both sides of any steps have handrails.  Any raised decks and porches should have guardrails on the edges.  Have any leaves, snow, or ice cleared regularly.  Use sand or salt on walking paths during winter.  Clean up any spills in your garage right away. This  includes oil or grease spills. What can I do in the bathroom?  Use night lights.  Install grab bars by the toilet and in the tub and shower. Do not use towel bars as grab bars.  Use non-skid mats or decals in the tub or shower.  If you need to sit down in the shower, use a plastic, non-slip stool.  Keep the floor dry. Clean up any water that spills on the floor as soon as it happens.  Remove soap buildup in the tub or shower regularly.  Attach bath mats securely with double-sided non-slip rug tape.  Do not have throw rugs and other things on the floor that can make you trip. What can I do in the bedroom?  Use night lights.  Make  sure that you have a light by your bed that is easy to reach.  Do not use any sheets or blankets that are too big for your bed. They should not hang down onto the floor.  Have a firm chair that has side arms. You can use this for support while you get dressed.  Do not have throw rugs and other things on the floor that can make you trip. What can I do in the kitchen?  Clean up any spills right away.  Avoid walking on wet floors.  Keep items that you use a lot in easy-to-reach places.  If you need to reach something above you, use a strong step stool that has a grab bar.  Keep electrical cords out of the way.  Do not use floor polish or wax that makes floors slippery. If you must use wax, use non-skid floor wax.  Do not have throw rugs and other things on the floor that can make you trip. What can I do with my stairs?  Do not leave any items on the stairs.  Make sure that there are handrails on both sides of the stairs and use them. Fix handrails that are broken or loose. Make sure that handrails are as long as the stairways.  Check any carpeting to make sure that it is firmly attached to the stairs. Fix any carpet that is loose or worn.  Avoid having throw rugs at the top or bottom of the stairs. If you do have throw rugs, attach them to the floor with carpet tape.  Make sure that you have a light switch at the top of the stairs and the bottom of the stairs. If you do not have them, ask someone to add them for you. What else can I do to help prevent falls?  Wear shoes that: ? Do not have high heels. ? Have rubber bottoms. ? Are comfortable and fit you well. ? Are closed at the toe. Do not wear sandals.  If you use a stepladder: ? Make sure that it is fully opened. Do not climb a closed stepladder. ? Make sure that both sides of the stepladder are locked into place. ? Ask someone to hold it for you, if possible.  Clearly mark and make sure that you can see: ? Any grab  bars or handrails. ? First and last steps. ? Where the edge of each step is.  Use tools that help you move around (mobility aids) if they are needed. These include: ? Canes. ? Walkers. ? Scooters. ? Crutches.  Turn on the lights when you go into a dark area. Replace any light bulbs as soon as they burn out.  Set up your furniture so  you have a clear path. Avoid moving your furniture around.  If any of your floors are uneven, fix them.  If there are any pets around you, be aware of where they are.  Review your medicines with your doctor. Some medicines can make you feel dizzy. This can increase your chance of falling. Ask your doctor what other things that you can do to help prevent falls. This information is not intended to replace advice given to you by your health care provider. Make sure you discuss any questions you have with your health care provider. Document Released: 07/05/2009 Document Revised: 02/14/2016 Document Reviewed: 10/13/2014 Elsevier Interactive Patient Education  2018 [place] Maintenance, Female Adopting a healthy lifestyle and getting preventive care can go a long way to promote health and wellness. Talk with your health care provider about what schedule of regular examinations is right for you. This is a good chance for you to check in with your provider about disease prevention and staying healthy. In between checkups, there are plenty of things you can do on your own. Experts have done a lot of research about which lifestyle changes and preventive measures are most likely to keep you healthy. Ask your health care provider for more information. Weight and diet Eat a healthy diet  Be sure to include plenty of vegetables, fruits, low-fat dairy products, and lean protein.  Do not eat a lot of foods high in solid fats, added sugars, or salt.  Get regular exercise. This is one of the most important things you can do for your health. ? Most adults  should exercise for at least 150 minutes each week. The exercise should increase your heart rate and make you sweat (moderate-intensity exercise). ? Most adults should also do strengthening exercises at least twice a week. This is in addition to the moderate-intensity exercise.  Maintain a healthy weight  Body mass index (BMI) is a measurement that can be used to identify possible weight problems. It estimates body fat based on height and weight. Your health care provider can help determine your BMI and help you achieve or maintain a healthy weight.  For females 64 years of age and older: ? A BMI below 18.5 is considered underweight. ? A BMI of 18.5 to 24.9 is normal. ? A BMI of 25 to 29.9 is considered overweight. ? A BMI of 30 and above is considered obese.  Watch levels of cholesterol and blood lipids  You should start having your blood tested for lipids and cholesterol at 66 years of age, then have this test every 5 years.  You may need to have your cholesterol levels checked more often if: ? Your lipid or cholesterol levels are high. ? You are older than 66 years of age. ? You are at high risk for heart disease.  Cancer screening Lung Cancer  Lung cancer screening is recommended for adults 14-60 years old who are at high risk for lung cancer because of a history of smoking.  A yearly low-dose CT scan of the lungs is recommended for people who: ? Currently smoke. ? Have quit within the past 15 years. ? Have at least a 30-pack-year history of smoking. A pack year is smoking an average of one pack of cigarettes a day for 1 year.  Yearly screening should continue until it has been 15 years since you quit.  Yearly screening should stop if you develop a health problem that would prevent you from having lung cancer treatment.  Breast Cancer  Practice breast self-awareness. This means understanding how your breasts normally appear and feel.  It also means doing regular breast  self-exams. Let your health care provider know about any changes, no matter how small.  If you are in your 20s or 30s, you should have a clinical breast exam (CBE) by a health care provider every 1-3 years as part of a regular health exam.  If you are 79 or older, have a CBE every year. Also consider having a breast X-ray (mammogram) every year.  If you have a family history of breast cancer, talk to your health care provider about genetic screening.  If you are at high risk for breast cancer, talk to your health care provider about having an MRI and a mammogram every year.  Breast cancer gene (BRCA) assessment is recommended for women who have family members with BRCA-related cancers. BRCA-related cancers include: ? Breast. ? Ovarian. ? Tubal. ? Peritoneal cancers.  Results of the assessment will determine the need for genetic counseling and BRCA1 and BRCA2 testing.  Cervical Cancer Your health care provider may recommend that you be screened regularly for cancer of the pelvic organs (ovaries, uterus, and vagina). This screening involves a pelvic examination, including checking for microscopic changes to the surface of your cervix (Pap test). You may be encouraged to have this screening done every 3 years, beginning at age 59.  For women ages 2-65, health care providers may recommend pelvic exams and Pap testing every 3 years, or they may recommend the Pap and pelvic exam, combined with testing for human papilloma virus (HPV), every 5 years. Some types of HPV increase your risk of cervical cancer. Testing for HPV may also be done on women of any age with unclear Pap test results.  Other health care providers may not recommend any screening for nonpregnant women who are considered low risk for pelvic cancer and who do not have symptoms. Ask your health care provider if a screening pelvic exam is right for you.  If you have had past treatment for cervical cancer or a condition that could  lead to cancer, you need Pap tests and screening for cancer for at least 20 years after your treatment. If Pap tests have been discontinued, your risk factors (such as having a new sexual partner) need to be reassessed to determine if screening should resume. Some women have medical problems that increase the chance of getting cervical cancer. In these cases, your health care provider may recommend more frequent screening and Pap tests.  Colorectal Cancer  This type of cancer can be detected and often prevented.  Routine colorectal cancer screening usually begins at 66 years of age and continues through 66 years of age.  Your health care provider may recommend screening at an earlier age if you have risk factors for colon cancer.  Your health care provider may also recommend using home test kits to check for hidden blood in the stool.  A small camera at the end of a tube can be used to examine your colon directly (sigmoidoscopy or colonoscopy). This is done to check for the earliest forms of colorectal cancer.  Routine screening usually begins at age 46.  Direct examination of the colon should be repeated every 5-10 years through 66 years of age. However, you may need to be screened more often if early forms of precancerous polyps or small growths are found.  Skin Cancer  Check your skin from head to toe regularly.  Tell  your health care provider about any new moles or changes in moles, especially if there is a change in a mole's shape or color.  Also tell your health care provider if you have a mole that is larger than the size of a pencil eraser.  Always use sunscreen. Apply sunscreen liberally and repeatedly throughout the day.  Protect yourself by wearing long sleeves, pants, a wide-brimmed hat, and sunglasses whenever you are outside.  Heart disease, diabetes, and high blood pressure  High blood pressure causes heart disease and increases the risk of stroke. High blood pressure  is more likely to develop in: ? People who have blood pressure in the high end of the normal range (130-139/85-89 mm Hg). ? People who are overweight or obese. ? People who are African American.  If you are 68-23 years of age, have your blood pressure checked every 3-5 years. If you are 62 years of age or older, have your blood pressure checked every year. You should have your blood pressure measured twice-once when you are at a hospital or clinic, and once when you are not at a hospital or clinic. Record the average of the two measurements. To check your blood pressure when you are not at a hospital or clinic, you can use: ? An automated blood pressure machine at a pharmacy. ? A home blood pressure monitor.  If you are between 59 years and 28 years old, ask your health care provider if you should take aspirin to prevent strokes.  Have regular diabetes screenings. This involves taking a blood sample to check your fasting blood sugar level. ? If you are at a normal weight and have a low risk for diabetes, have this test once every three years after 66 years of age. ? If you are overweight and have a high risk for diabetes, consider being tested at a younger age or more often. Preventing infection Hepatitis B  If you have a higher risk for hepatitis B, you should be screened for this virus. You are considered at high risk for hepatitis B if: ? You were born in a country where hepatitis B is common. Ask your health care provider which countries are considered high risk. ? Your parents were born in a high-risk country, and you have not been immunized against hepatitis B (hepatitis B vaccine). ? You have HIV or AIDS. ? You use needles to inject street drugs. ? You live with someone who has hepatitis B. ? You have had sex with someone who has hepatitis B. ? You get hemodialysis treatment. ? You take certain medicines for conditions, including cancer, organ transplantation, and autoimmune  conditions.  Hepatitis C  Blood testing is recommended for: ? Everyone born from [birth year] through 1964. ? Anyone with known risk factors for hepatitis C.  Sexually transmitted infections (STIs)  You should be screened for sexually transmitted infections (STIs) including gonorrhea and chlamydia if: ? You are sexually active and are younger than 66 years of age. ? You are older than 66 years of age and your health care provider tells you that you are at risk for this type of infection. ? Your sexual activity has changed since you were last screened and you are at an increased risk for chlamydia or gonorrhea. Ask your health care provider if you are at risk.  If you do not have HIV, but are at risk, it may be recommended that you take a prescription medicine daily to prevent HIV infection. This is called pre-exposure  prophylaxis (PrEP). You are considered at risk if: ? You are sexually active and do not regularly use condoms or know the HIV status of your partner(s). ? You take drugs by injection. ? You are sexually active with a partner who has HIV.  Talk with your health care provider about whether you are at high risk of being infected with HIV. If you choose to begin PrEP, you should first be tested for HIV. You should then be tested every 3 months for as long as you are taking PrEP. Pregnancy  If you are premenopausal and you may become pregnant, ask your health care provider about preconception counseling.  If you may become pregnant, take 400 to 800 micrograms (mcg) of folic acid every day.  If you want to prevent pregnancy, talk to your health care provider about birth control (contraception). Osteoporosis and menopause  Osteoporosis is a disease in which the bones lose minerals and strength with aging. This can result in serious bone fractures. Your risk for osteoporosis can be identified using a bone density scan.  If you are 62 years of age or older, or if you are at risk for  osteoporosis and fractures, ask your health care provider if you should be screened.  Ask your health care provider whether you should take a calcium or vitamin D supplement to lower your risk for osteoporosis.  Menopause may have certain physical symptoms and risks.  Hormone replacement therapy may reduce some of these symptoms and risks. Talk to your health care provider about whether hormone replacement therapy is right for you. Follow these instructions at home:  Schedule regular health, dental, and eye exams.  Stay current with your immunizations.  Do not use any tobacco products including cigarettes, chewing tobacco, or electronic cigarettes.  If you are pregnant, do not drink alcohol.  If you are breastfeeding, limit how much and how often you drink alcohol.  Limit alcohol intake to no more than 1 drink per day for nonpregnant women. One drink equals 12 ounces of beer, 5 ounces of wine, or 1 ounces of hard liquor.  Do not use street drugs.  Do not share needles.  Ask your health care provider for help if you need support or information about quitting drugs.  Tell your health care provider if you often feel depressed.  Tell your health care provider if you have ever been abused or do not feel safe at home. This information is not intended to replace advice given to you by your health care provider. Make sure you discuss any questions you have with your health care provider. Document Released: 03/24/2011 Document Revised: 02/14/2016 Document Reviewed: 06/12/2015 Elsevier Interactive Patient Education  [employer].

## 2018-07-29 NOTE — Addendum Note (Signed)
Addended by: Shelva Majestic on: 07/29/2018 08:59 PM   Modules accepted: Orders

## 2018-07-29 NOTE — Progress Notes (Signed)
I have reviewed and agree with note, evaluation, plan. Patient's chronic pain weights on her and contributes to depression but has used pain meds to overdose in past so we have tried to keep her on a low dose and balance those risks. I am still waiting on final culture results to start her antibiotics- pending at present.   Tana Conch, MD

## 2018-08-23 ENCOUNTER — Encounter: Payer: Self-pay | Admitting: Family Medicine

## 2018-08-23 ENCOUNTER — Other Ambulatory Visit: Payer: Self-pay

## 2018-08-23 ENCOUNTER — Other Ambulatory Visit: Payer: BLUE CROSS/BLUE SHIELD

## 2018-08-23 DIAGNOSIS — R3915 Urgency of urination: Secondary | ICD-10-CM

## 2018-08-25 ENCOUNTER — Telehealth: Payer: Self-pay | Admitting: Family Medicine

## 2018-08-25 ENCOUNTER — Other Ambulatory Visit: Payer: Self-pay

## 2018-08-25 ENCOUNTER — Encounter: Payer: Self-pay | Admitting: Family Medicine

## 2018-08-25 LAB — URINE CULTURE
MICRO NUMBER: 91439712
SPECIMEN QUALITY: ADEQUATE

## 2018-08-25 MED ORDER — CEPHALEXIN 500 MG PO CAPS: 500.0000 mg | ORAL_CAPSULE | Freq: Four times a day (QID) | ORAL | 0 refills | Status: DC

## 2018-08-25 MED ORDER — CEPHALEXIN 500 MG PO CAPS: 500.0000 mg | ORAL_CAPSULE | Freq: Four times a day (QID) | ORAL | 0 refills | Status: AC

## 2018-08-25 NOTE — Telephone Encounter (Signed)
10 days yes

## 2018-08-25 NOTE — Telephone Encounter (Signed)
Rx should be take 4 x daily x 10 days, not 11 days, correct?

## 2018-08-25 NOTE — Telephone Encounter (Signed)
See note  Copied from CRM [phone]. Topic: General - Other >> Aug 25, 2018  3:50 PM Tamela OddiHarris, Brenda J wrote: Reason for CRM: [facility] on [facility]'s medication for  cephALEXin (KEFLEX) 500 MG capsule .  Please advise and call back at [phone]

## 2018-08-25 NOTE — Telephone Encounter (Signed)
Copied from CRM [id number]. Topic: General - Other >> Aug 25, 2018  3:50 PM Tamela OddiHarris, Emmanuelle Hibbitts J wrote: Reason for CRM: [facility] on patient's medication for  cephALEXin (KEFLEX) 500 MG capsule .  Please advise and call back at [phone]

## 2018-08-25 NOTE — Telephone Encounter (Signed)
Called pharmacy and advised

## 2018-08-25 NOTE — Addendum Note (Signed)
Addended by: Shelva MajesticHUNTER, Xandrea Clarey O on: 08/25/2018 03:45 PM   Modules accepted: Orders

## 2018-08-25 NOTE — Telephone Encounter (Signed)
Unable to hold on the phone any longer. Sent new rx with clarification.

## 2018-08-26 ENCOUNTER — Telehealth: Payer: Self-pay | Admitting: *Deleted

## 2018-08-26 NOTE — Telephone Encounter (Signed)
Patient returned call. Reviewed results and physician's note with patient.

## 2018-09-09 ENCOUNTER — Other Ambulatory Visit: Payer: Self-pay | Admitting: Family Medicine

## 2018-09-21 ENCOUNTER — Other Ambulatory Visit (INDEPENDENT_AMBULATORY_CARE_PROVIDER_SITE_OTHER): Payer: BLUE CROSS/BLUE SHIELD

## 2018-09-21 ENCOUNTER — Encounter: Payer: Self-pay | Admitting: Family Medicine

## 2018-09-21 ENCOUNTER — Other Ambulatory Visit: Payer: Self-pay

## 2018-09-21 DIAGNOSIS — R3915 Urgency of urination: Secondary | ICD-10-CM

## 2018-09-21 LAB — POCT URINALYSIS DIPSTICK
Bilirubin, UA: NEGATIVE
Blood, UA: NEGATIVE
Glucose, UA: NEGATIVE
Ketones, UA: NEGATIVE
Nitrite, UA: NEGATIVE
Protein, UA: NEGATIVE
Spec Grav, UA: 1.01 (ref 1.010–1.025)
Urobilinogen, UA: 0.2 E.U./dL
pH, UA: 7 (ref 5.0–8.0)

## 2018-09-23 LAB — URINE CULTURE
MICRO NUMBER:: 91556935
SPECIMEN QUALITY:: ADEQUATE

## 2018-09-24 ENCOUNTER — Other Ambulatory Visit: Payer: Self-pay | Admitting: Family Medicine

## 2018-09-24 MED ORDER — FOSFOMYCIN TROMETHAMINE 3 G PO PACK: 3.0000 g | PACK | Freq: Once | ORAL | 0 refills | Status: AC

## 2018-09-24 NOTE — Progress Notes (Signed)
See result note.  

## 2018-09-27 ENCOUNTER — Other Ambulatory Visit: Payer: Self-pay

## 2018-09-27 DIAGNOSIS — N390 Urinary tract infection, site not specified: Secondary | ICD-10-CM

## 2018-09-27 MED ORDER — FOSFOMYCIN TROMETHAMINE 3 G PO PACK: 3.0000 g | PACK | Freq: Once | ORAL | 0 refills | Status: AC

## 2018-10-13 DIAGNOSIS — R351 Nocturia: Secondary | ICD-10-CM | POA: Diagnosis not present

## 2018-10-13 DIAGNOSIS — R350 Frequency of micturition: Secondary | ICD-10-CM | POA: Diagnosis not present

## 2018-10-13 DIAGNOSIS — N3941 Urge incontinence: Secondary | ICD-10-CM | POA: Diagnosis not present

## 2018-10-28 ENCOUNTER — Encounter: Payer: Self-pay | Admitting: Family Medicine

## 2018-10-28 ENCOUNTER — Ambulatory Visit (INDEPENDENT_AMBULATORY_CARE_PROVIDER_SITE_OTHER): Payer: Medicare HMO | Admitting: Family Medicine

## 2018-10-28 VITALS — BP 100/72 | HR 72 | Temp 97.5°F | Ht 67.5 in | Wt 167.6 lb

## 2018-10-28 DIAGNOSIS — I480 Paroxysmal atrial fibrillation: Secondary | ICD-10-CM | POA: Diagnosis not present

## 2018-10-28 DIAGNOSIS — F332 Major depressive disorder, recurrent severe without psychotic features: Secondary | ICD-10-CM

## 2018-10-28 DIAGNOSIS — Z23 Encounter for immunization: Secondary | ICD-10-CM

## 2018-10-28 DIAGNOSIS — E785 Hyperlipidemia, unspecified: Secondary | ICD-10-CM

## 2018-10-28 DIAGNOSIS — G2581 Restless legs syndrome: Secondary | ICD-10-CM

## 2018-10-28 DIAGNOSIS — Z0000 Encounter for general adult medical examination without abnormal findings: Secondary | ICD-10-CM | POA: Diagnosis not present

## 2018-10-28 DIAGNOSIS — R69 Illness, unspecified: Secondary | ICD-10-CM | POA: Diagnosis not present

## 2018-10-28 DIAGNOSIS — Z6825 Body mass index (BMI) 25.0-25.9, adult: Secondary | ICD-10-CM | POA: Diagnosis not present

## 2018-10-28 MED ORDER — HYDROCODONE-ACETAMINOPHEN 5-325 MG PO TABS: 0.5000 | ORAL_TABLET | Freq: Two times a day (BID) | ORAL | 0 refills | Status: DC | PRN

## 2018-10-28 NOTE — Progress Notes (Signed)
Phone: [phone]   Subjective:  Patient presents today for their annual physical. Chief complaint-noted.   See problem oriented charting- ROS- full  review of systems was completed and negative except for: Continued chronic pain  BMI monitoring- elevated BMI noted: Body mass index is 25.86 kg/m. Encouraged need for healthy eating, regular exercise, weight loss.     BMI Metric Follow Up - 10/28/18 1357      BMI Metric Follow Up-Please document annually   BMI Metric Follow Up  Education provided        The following were reviewed and entered/updated in epic: Past Medical History:  Diagnosis Date  . Arthritis    DJD, low back, thumb  . Atrial fibrillation (HCC)    Xarelto anticoagulation. Flecainide antiarrythmic   . Chicken pox   . Chronic pain syndrome    On disability. History of bilateral hip pain, low back pain. Gabapentin 400 BID, percocet 1 tablet daily per prior provider.   . Colon polyp    awaiting records  . Depression    zoloft 100mg , remeron 30mg  per psychiatry. ambien 10mg  per psychiatry to help wtih sleep element.   . Dystonia    described as psychogenic dystonia. Pain and twisting from upper chest and up ith triggers "wind, creamy food" on TID ativan per psychiatry previously.   . Fibromyalgia   . GERD (gastroesophageal reflux disease)    omeprazole OTC  . Goiter    states multiple imaging tests, has had biopsies  . Hyperlipidemia    lovastatin 20mg   . Stroke (HCC)    TIA (left side of face and body decreased sensitivity than right face and side)  . TIA (transient ischemic attack)   . Vaginal atrophy    estrace vaginal cream   Patient Active Problem List   Diagnosis Date Noted  . Major depressive disorder, recurrent severe without psychotic features (HCC) 09/02/2016    Priority: High  . History of SI/intentional drug overdose 11/22/2015    Priority: High  . Atrial fibrillation (HCC)     Priority: High  . Chronic pain syndrome     Priority: High   . Hyperlipidemia     Priority: Medium  . GERD (gastroesophageal reflux disease) 01/30/2016    Priority: Low  . Restless legs 07/27/2018  . UTI (urinary tract infection) 10/07/2016   Past Surgical History:  Procedure Laterality Date  . ATRIAL FIBRILLATION ABLATION    . BIOPSY THYROID    . fusion l4-l5  2000  . LAMINECTOMY     L4-L5  . OTHER SURGICAL HISTORY     tennis elbow surgery  . piriformis release  1999  . TONSILLECTOMY AND ADENOIDECTOMY  age 60  . VAGINAL HYSTERECTOMY  1993    Family History  Problem Relation Age of Onset  . Alcohol abuse Mother   . Hypertension Father   . Alcohol abuse Father   . Pancreatic cancer Father   . Cancer Brother        spindle cell RLE  . Cancer Brother        tonsil cancer  . Skin cancer Brother     Medications- reviewed and updated Current Outpatient Medications  Medication Sig Dispense Refill  . acetaminophen (TYLENOL) 500 MG tablet Take 1,000 mg by mouth every 6 (six) hours as needed for mild pain, moderate pain, fever or headache.    Marland Kitchen atorvastatin (LIPITOR) 20 MG tablet TAKE 1 TABLET BY MOUTH ONCE DAILY 90 tablet 2  . cholecalciferol (VITAMIN D) 1000 units  tablet Take 1,000 Units by mouth daily.    Marland Kitchen co-enzyme Q-10 30 MG capsule Take 30 mg by mouth daily.    . flecainide (TAMBOCOR) 100 MG tablet TAKE 1 TABLET BY MOUTH TWICE DAILY 180 tablet 1  . gabapentin (NEURONTIN) 300 MG capsule Take 300 mg by mouth at bedtime.    Marland Kitchen HYDROcodone-acetaminophen (NORCO/VICODIN) 5-325 MG tablet Take 0.5-1 tablets by mouth 2 (two) times daily as needed for moderate pain or severe pain (may fill in 1 month). 30 tablet 0  . HYDROcodone-acetaminophen (NORCO/VICODIN) 5-325 MG tablet Take 0.5-1 tablets by mouth 2 (two) times daily as needed for moderate pain or severe pain (may fill in 2 months). 30 tablet 0  . HYDROcodone-acetaminophen (NORCO/VICODIN) 5-325 MG tablet Take 0.5-1 tablets by mouth 2 (two) times daily as needed for moderate pain or severe  pain (may fill today). 30 tablet 0  . Melatonin 3 MG TABS Take 2 tablets by mouth at bedtime.     . Multiple Vitamin (MULTIVITAMIN WITH MINERALS) TABS tablet Take 1 tablet by mouth daily.    Marland Kitchen OLANZapine (ZYPREXA) 20 MG tablet Take 20 mg by mouth at bedtime.    Marland Kitchen omeprazole (PRILOSEC) 20 MG capsule TAKE 1 CAPSULE BY MOUTH TWICE DAILY BEFORE A MEAL 60 capsule 6  . rivaroxaban (XARELTO) 20 MG TABS tablet Take 1 tablet (20 mg total) by mouth daily with supper. 90 tablet 3  . traZODone (DESYREL) 150 MG tablet Take 300 mg by mouth at bedtime.     No current facility-administered medications for this visit.     Allergies-reviewed and updated Allergies  Allergen Reactions  . Reglan [Metoclopramide] Other (See Comments)    Suicidal thoughts  . Ciprofloxacin Diarrhea  . Clindamycin/Lincomycin Diarrhea  . Vibramycin [Doxycycline] Diarrhea  . Macrobid [Nitrofurantoin Monohyd Macro] Rash    Rash on lip  . Sulfa Antibiotics Rash    Social History   Social History Narrative   Family: Married. Husband works as Hospital doctor for [employer] center. 2 children from previous marriage 1 from current. Son Jeannett Senior lives with them and has down's syndrome.       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 Moved to [facility] from [phone] and became disabled in that time frame due to hip/back issues.       Hobbies: time with son   Objective  Objective:  BP 100/72 (BP Location: Left Arm, Patient Position: Sitting, Cuff Size: Large)   Pulse 72   Temp (!) 97.5 F (36.4 C) (Oral)   Ht 5' 7.5" (1.715 m)   Wt 167 lb 9.6 oz (76 kg)   SpO2 97%   BMI 25.86 kg/m  Gen: NAD, resting comfortably HEENT: Mucous membranes are moist. Oropharynx normal Neck: no thyromegaly CV: RRR no murmurs rubs or gallops Lungs: CTAB no crackles, wheeze, rhonchi Abdomen: soft/nontender/nondistended/normal bowel sounds.  Ext: no edema Skin: warm, dry Neuro: grossly normal, moves all extremities,  PERRLA  Declines breast exam.  Pelvic exam not indicated   Assessment and Plan    68 y.o. female presenting for annual physical.  Health Maintenance counseling: 1. Anticipatory guidance: Patient counseled regarding regular dental exams -q6 months, eye exams - yearly,  avoiding smoking and second hand smoke , limiting alcohol to 1 beverage per day- no aclohol .   2. Risk factor reduction:  Advised patient of need for regular exercise and diet rich and fruits and vegetables to reduce risk of heart attack and stroke. Exercise- trying to do some walking-  about once a week for 15 minutes. Diet- reasonably healthy- weight largely stable.  Wt Readings from Last 3 Encounters:  10/28/18 167 lb 9.6 oz (76 kg)  07/29/18 166 lb (75.3 kg)  07/27/18 165 lb 6.4 oz (75 kg)  3. Immunizations/screenings/ancillary studies- pneumovax 23 today  Immunization History  Administered Date(s) Administered  . Influenza, High Dose Seasonal PF 07/29/2017  . Influenza-Unspecified 06/09/2013, 06/06/2014, 07/04/2015, 07/26/2016, 07/26/2018  . Pneumococcal Conjugate-13 04/27/2017  . Pneumococcal Polysaccharide-23 09/23/2012, 10/28/2018  . Td 09/23/2012  . Tdap 06/13/2012  . Zoster 08/16/2012  4. Cervical cancer screening-passed age based screening-denies history of abnormal result 5. Breast cancer screening-  breast exam declined- does self exams0 and mammogram 03/24/2018 reassuring 6. Colon cancer screening -10/18/2013 with 10-year follow-up- dont see copy of result - we are going to try to find the records- she got paperwork that she is due so we need to locate records so we can refer her locally- thinks she had a polyp.  7. Skin cancer screening- no dermatologist. advised regular sunscreen use. Denies worrisome, changing, or new skin lesions.  8. Birth control/STD check- postmenopausal/monogamous 9. Osteoporosis screening at 66-normal bone density 2017 10.  Never smoker  Status of chronic or acute concerns    #Atrial fibrillation S:Compliant with Xarelto for anticoagulation.  Flecainide as an antiarrhythmic.  Does not follow with cardiology regularly A/P:  Stable. Continue current medications.      #Chronic pain syndrome S:On disability due to chronic pain syndrome.  Hydrocodone 5/325 once per day-either takes full tablet once or splits and takes twice a day. On gabapentin at bedtime from psychiatry- not sure this helps with pain.  -Pain worsened with PT A/P:  Stable. Continue current medications.    -UDS 04/26/2018 - Pain contract 01/24/2018 -NCCSRS reviewed today    #Major depressive disorder S:working with psychiatry. On zyprexa 20mg , gabepentin, trazodone.   She didn't note a big benefit with counseling and has since stopped A/P: PHQ 9 remains elevated-patient will continue to work with psychiatry to help further control this.  Has thoughts that she would be better off dead but no thoughts at this time of self-harm-she knows if she has progression of this thought to self-harm to reach out immediately to psychiatry or [place]- does have history of suicide attempt by overdose-thus trying to limit pain medication she has on hand Depression screen [facility] 2/9 10/28/2018  Decreased Interest 1  Down, Depressed, Hopeless 1  PHQ - 2 Score 2  Altered sleeping 2  Tired, decreased energy 3  Change in appetite 0  Feeling bad or failure about yourself  1  Trouble concentrating 1  Moving slowly or fidgety/restless 0  Suicidal thoughts 1  PHQ-9 Score 10  Difficult doing work/chores Somewhat difficult    #Hyperlipidemia S:compliant atorvastatin 20mg . LDL goal at least under 100  A/P: Hopefully Stable-update lipids today. Continue current medications.     #GERD S:compliant with omeprazole. She is on b12 and folate in regards to PPI risk of lowering these.   A/P:  Stable. Continue current medications.    -Check B12 with high risk medication use  #Restless legs S:restless legs has been better with b12 and  ferrous sulfate/iron.  A/P:  Stable. Continue current medications.-  Update ferritin  %Recurrent UTI- has seen urology. Had cystoscopy. Ultrasound planned feb 2020. Had another UTI and treated with augmentin recently.    -Will need 54-month follow-up for pain medication refill Lab/Order associations:2 cups of coffee with creamer and some grapes  Preventative health care - Plan: Vitamin B12, IBC + Ferritin, CBC, Lipid panel, Comprehensive metabolic panel  Need for prophylactic vaccination against Streptococcus pneumoniae (pneumococcus) - Plan: Pneumococcal polysaccharide vaccine 23-valent greater than or equal to 2yo subcutaneous/IM  Restless legs - Plan: Vitamin B12, IBC + Ferritin  Hyperlipidemia, unspecified hyperlipidemia type - Plan: CBC, Lipid panel, Comprehensive metabolic panel  BMI 25.0-25.9,adult  Meds ordered this encounter  Medications  . HYDROcodone-acetaminophen (NORCO/VICODIN) 5-325 MG tablet    Sig: Take 0.5-1 tablets by mouth 2 (two) times daily as needed for moderate pain or severe pain (may fill in 1 month).    Dispense:  30 tablet    Refill:  0  . HYDROcodone-acetaminophen (NORCO/VICODIN) 5-325 MG tablet    Sig: Take 0.5-1 tablets by mouth 2 (two) times daily as needed for moderate pain or severe pain (may fill in 2 months).    Dispense:  30 tablet    Refill:  0  . HYDROcodone-acetaminophen (NORCO/VICODIN) 5-325 MG tablet    Sig: Take 0.5-1 tablets by mouth 2 (two) times daily as needed for moderate pain or severe pain (may fill today).    Dispense:  30 tablet    Refill:  0   Return precautions advised.  Tana ConchStephen Cleta Heatley, MD

## 2018-10-28 NOTE — Patient Instructions (Addendum)
Health Maintenance Due  Topic Date Due  . PNA vac Low Risk Adult (2 of 2 - PPSV23)-completed today 04/27/2018   Please check with your pharmacy to see if they have the shingrix vaccine. If they do- please get this immunization and update [place] by phone call or mychart with dates you receive the vaccine  Danielle Rocha- if you havent been able to locate the report for colonoscopy- can you help patient set up ROI either with you or checkout desk to locate those records.    Please stop by lab before you go If you do not have mychart- we will call you about results within 5 business days of [place] receiving them.  If you have mychart- we will send your results within 3 business days of [place] receiving them.  If abnormal or we want to clarify a result, we will call or mychart you to make sure you receive the message.  If you have questions or concerns or don't hear within 5-7 days, please send [place] a message or call us.

## 2018-10-29 LAB — COMPREHENSIVE METABOLIC PANEL
ALT: 18 U/L (ref 0–35)
AST: 14 U/L (ref 0–37)
Albumin: 4.5 g/dL (ref 3.5–5.2)
Alkaline Phosphatase: 62 U/L (ref 39–117)
BILIRUBIN TOTAL: 0.5 mg/dL (ref 0.2–1.2)
BUN: 10 mg/dL (ref 6–23)
CO2: 28 mEq/L (ref 19–32)
Calcium: 9.6 mg/dL (ref 8.4–10.5)
Chloride: 105 mEq/L (ref 96–112)
Creatinine, Ser: 0.9 mg/dL (ref 0.40–1.20)
GFR: 62.55 mL/min (ref >60.00)
Glucose, Bld: 86 mg/dL (ref 70–99)
Potassium: 4.2 mEq/L (ref 3.5–5.1)
Sodium: 144 mEq/L (ref 135–145)
Total Protein: 6.7 g/dL (ref 6.0–8.3)

## 2018-10-29 LAB — CBC
HCT: 44.4 % (ref 36.0–46.0)
Hemoglobin: 15.2 g/dL — ABNORMAL HIGH (ref 12.0–15.0)
MCHC: 34.3 g/dL (ref 30.0–36.0)
MCV: 86.7 fl (ref 78.0–100.0)
Platelets: 244 10*3/uL (ref 150.0–400.0)
RBC: 5.13 Mil/uL — AB (ref 3.87–5.11)
RDW: 13.2 % (ref 11.5–15.5)
WBC: 7.2 10*3/uL (ref 4.0–10.5)

## 2018-10-29 LAB — LIPID PANEL
Cholesterol: 198 mg/dL (ref 0–200)
HDL: 43.2 mg/dL (ref >39.00)
NonHDL: 154.62
TRIGLYCERIDES: 231 mg/dL — AB (ref 0.0–149.0)
Total CHOL/HDL Ratio: 5
VLDL: 46.2 mg/dL — ABNORMAL HIGH (ref 0.0–40.0)

## 2018-10-29 LAB — IBC + FERRITIN
Ferritin: 23.4 ng/mL (ref 10.0–291.0)
Iron: 85 ug/dL (ref 42–145)
Saturation Ratios: 27.2 % (ref 20.0–50.0)
Transferrin: 223 mg/dL (ref 212.0–360.0)

## 2018-10-29 LAB — LDL CHOLESTEROL, DIRECT: Direct LDL: 122 mg/dL

## 2018-10-29 LAB — VITAMIN B12: Vitamin B-12: 905 pg/mL (ref 211–911)

## 2018-11-04 ENCOUNTER — Encounter: Payer: Self-pay | Admitting: Family Medicine

## 2018-11-08 ENCOUNTER — Telehealth: Payer: Self-pay | Admitting: Family Medicine

## 2018-11-08 NOTE — Telephone Encounter (Signed)
See note

## 2018-11-08 NOTE — Telephone Encounter (Signed)
Copied from CRM [id number]. Topic: Quick Communication - See Telephone Encounter >> Nov 08, 2018  4:16 PM Maia Petties wrote: CRM for notification. See Telephone encounter for: 11/08/18.  Pt noting that her psychologist is not longer in network with [facility] insurance. Pt notes that she has been seeing [facility]. She said that Neva Seat and [facility] psychology at [facility] are in network. She is also asking about psychology and counseling thru [facility] if available. She would like recommendation from Dr. Durene Cal.

## 2018-11-09 DIAGNOSIS — N390 Urinary tract infection, site not specified: Secondary | ICD-10-CM | POA: Diagnosis not present

## 2018-11-09 DIAGNOSIS — R350 Frequency of micturition: Secondary | ICD-10-CM | POA: Diagnosis not present

## 2018-11-09 DIAGNOSIS — N281 Cyst of kidney, acquired: Secondary | ICD-10-CM | POA: Diagnosis not present

## 2018-11-09 NOTE — Telephone Encounter (Signed)
If [place] is covered- I think that would be reasonable for counseling.   My question is- is she talking about her psychiatrist? If so we have no psychiatrist within [place]. Would have to simply see who is covered by insurance in area-

## 2018-11-09 NOTE — Telephone Encounter (Signed)
Dr. Durene Cal, would you recommend referring to Neva Seat and Mallie Mussel psychology at [facility], or trying to get pt set up with [facility]?

## 2018-11-10 ENCOUNTER — Telehealth: Payer: Self-pay | Admitting: Family Medicine

## 2018-11-10 ENCOUNTER — Encounter: Payer: Self-pay | Admitting: Family Medicine

## 2018-11-10 ENCOUNTER — Ambulatory Visit (INDEPENDENT_AMBULATORY_CARE_PROVIDER_SITE_OTHER): Payer: Medicare HMO | Admitting: Family Medicine

## 2018-11-10 VITALS — BP 110/70 | HR 83 | Temp 97.7°F | Ht 67.5 in | Wt 168.0 lb

## 2018-11-10 DIAGNOSIS — G249 Dystonia, unspecified: Secondary | ICD-10-CM | POA: Diagnosis not present

## 2018-11-10 MED ORDER — CARBIDOPA-LEVODOPA 25-100 MG PO TABS: 0.5000 | ORAL_TABLET | Freq: Two times a day (BID) | ORAL | 1 refills | Status: DC

## 2018-11-10 NOTE — Progress Notes (Signed)
Patient: Danielle Rocha MRN: [id number] DOB: 19-Jun-1951 PCP: Danielle Majestic, MD     Subjective:  Chief Complaint  Patient presents with  . dystonia    HPI: The patient is a 68 y.o. female who presents today for dystonia. She had it about 5 years ago and it lasted a year and went away on it's own. She states this episode started last week. It is mainly in her face and in her neck. If she lays down she can get some relief. She was on ativan in the past for this. She has not been on this for years. She was worked up at [facility] in the past. She has been on her zyprexa x 4 years with no issues.    Review of Systems  Constitutional: Positive for fatigue.  Eyes: Negative for visual disturbance.  Cardiovascular: Negative for chest pain.  Gastrointestinal: Negative for nausea and vomiting.  Musculoskeletal: Positive for arthralgias, back pain and neck pain.       Pt c/o chronic b/l hip and LBP  Neurological: Positive for headaches. Negative for dizziness.  Psychiatric/Behavioral: Positive for sleep disturbance.    Allergies Patient is allergic to reglan [metoclopramide]; ciprofloxacin; clindamycin/lincomycin; vibramycin [doxycycline]; macrobid [nitrofurantoin monohyd macro]; and sulfa antibiotics.  Past Medical History Patient  has a past medical history of Arthritis, Atrial fibrillation (HCC), Chicken pox, Chronic pain syndrome, Colon polyp, Depression, Dystonia, Fibromyalgia, GERD (gastroesophageal reflux disease), Goiter, Hyperlipidemia, Stroke (HCC), TIA (transient ischemic attack), and Vaginal atrophy.  Surgical History Patient  has a past surgical history that includes piriformis release (1999); fusion l4-l5 (2000); Tonsillectomy and adenoidectomy (age 40); Vaginal hysterectomy (1993); Other surgical history; Biopsy thyroid; Laminectomy; and Atrial fibrillation ablation.  Family History Pateint's family history includes Alcohol abuse in her father and mother; Cancer in her  brother and brother; Hypertension in her father; Pancreatic cancer in her father; Skin cancer in her brother.  Social History Patient  reports that she has never smoked. She has never used smokeless tobacco. She reports that she does not drink alcohol or use drugs.    Objective: Vitals:   11/10/18 1420  BP: 110/70  Pulse: 83  Temp: 97.7 F (36.5 C)  TempSrc: Oral  SpO2: 97%  Weight: 168 lb (76.2 kg)  Height: 5' 7.5" (1.715 m)    Body mass index is 25.92 kg/m.  Physical Exam Vitals signs reviewed.  Constitutional:      Appearance: Normal appearance.  Neck:     Musculoskeletal: Neck rigidity present.  Cardiovascular:     Rate and Rhythm: Normal rate and regular rhythm.     Heart sounds: Normal heart sounds.  Pulmonary:     Effort: Pulmonary effort is normal.     Breath sounds: Normal breath sounds.  Abdominal:     General: Abdomen is flat. Bowel sounds are normal.     Palpations: Abdomen is soft.  Lymphadenopathy:     Cervical: No cervical adenopathy.  Neurological:     Mental Status: She is alert and oriented to person, place, and time.     Comments: Muscle contraction in her neck that are visible.   Psychiatric:        Mood and Affect: Mood normal.        Behavior: Behavior normal.        Assessment/plan: 1. Dystonia She is on zyprexa and I wonder if this could be related. She is followed by psychiatry and I do not want to just stop this as she has severe depression.  Im going to have her call psychiatry and Im going to give them a call as well. Will start her on carbidopa to see if this helps in the meantime and refer to neurology incase not related to medication. Discussed I do not feel comfortable doing ativan with her on norco. May need to wean down zyprexa, but will defer to psychiatry.  - Ambulatory referral to Neurology      Return if symptoms worsen or fail to improve.   Danielle Mustard, MD [facility]   11/10/2018

## 2018-11-10 NOTE — Patient Instructions (Signed)
Starting you on carbidopa twice a day. You will take 1/2 pill. Also sending you to neurology.

## 2018-11-10 NOTE — Telephone Encounter (Signed)
Called and spoke with patient, she states that she has already contacted her psychiatrist's office and left a message in regards to medication being switched to carbidopa at today's visit by Dr. Artis Flock.  Pt states that when she hears back from her psychiatrist, she will call back w/update.

## 2018-11-10 NOTE — Telephone Encounter (Signed)
Please call her psychiatrist and let them know that patient was seen today for dystonia. She is on 20mg  of zyprexa. I didn't wean this down, but wanted to let them know so they could possibly see her and see if they wanted to change drugs or leave as it. I started her on carbidopa as her dystonia is quite severe.

## 2018-11-10 NOTE — Telephone Encounter (Signed)
Spoke to pt and gave update. Pt was referring to a Psychologist. Pt stated she is seeing Dr. Artis Flock today and will speak to her soon.

## 2018-11-11 NOTE — Telephone Encounter (Signed)
im going to forward this to her PCP.

## 2018-11-11 NOTE — Telephone Encounter (Signed)
I recommend she see her psychiatrist- I think I got a message that it was out of network now but she needs to work through current issue and then we can get her plugged into new psychiatrist. She needs to find out which psychiatrist's are covered and we call them for an appointment- we can place referral if needed but usually the psychiatry offices will not accept referrals from [place]. Happy to see her back to sit down to chat.

## 2018-11-11 NOTE — Progress Notes (Signed)
Thanks so much! You are so thoughtful as always

## 2018-11-11 NOTE — Telephone Encounter (Signed)
Pt called and stated she wanted to share with Dr. Artis Flock that she knows her dystonia is due to psychological reasons and she's not sure she needs to see a Neurologist again. She put the Neurologist appointment on hold. Stated she needs a referral and PA for psychologist/psychiatrist, whichever one Dr. Artis Flock recommends. She also has concerns about side effects of medications that Dr. Artis Flock prescribed. She stated she is willing to come back in and talk to Dr. Artis Flock if needed. Please advise.

## 2018-11-12 NOTE — Telephone Encounter (Signed)
Pt notified of update and verbalized understanding. Pt also advised that most physiatrists does not need a referral so pt was advise to call to schedule an appt.

## 2018-11-22 DIAGNOSIS — R69 Illness, unspecified: Secondary | ICD-10-CM | POA: Diagnosis not present

## 2018-11-30 DIAGNOSIS — I4892 Unspecified atrial flutter: Secondary | ICD-10-CM | POA: Diagnosis not present

## 2018-11-30 DIAGNOSIS — R69 Illness, unspecified: Secondary | ICD-10-CM | POA: Diagnosis not present

## 2018-11-30 DIAGNOSIS — E785 Hyperlipidemia, unspecified: Secondary | ICD-10-CM | POA: Diagnosis not present

## 2018-11-30 DIAGNOSIS — G4700 Insomnia, unspecified: Secondary | ICD-10-CM | POA: Diagnosis not present

## 2018-11-30 DIAGNOSIS — G8929 Other chronic pain: Secondary | ICD-10-CM | POA: Diagnosis not present

## 2018-11-30 DIAGNOSIS — K219 Gastro-esophageal reflux disease without esophagitis: Secondary | ICD-10-CM | POA: Diagnosis not present

## 2018-11-30 DIAGNOSIS — M2550 Pain in unspecified joint: Secondary | ICD-10-CM | POA: Diagnosis not present

## 2018-11-30 DIAGNOSIS — R32 Unspecified urinary incontinence: Secondary | ICD-10-CM | POA: Diagnosis not present

## 2018-11-30 DIAGNOSIS — N390 Urinary tract infection, site not specified: Secondary | ICD-10-CM | POA: Diagnosis not present

## 2018-12-13 DIAGNOSIS — R69 Illness, unspecified: Secondary | ICD-10-CM | POA: Diagnosis not present

## 2018-12-23 DIAGNOSIS — N390 Urinary tract infection, site not specified: Secondary | ICD-10-CM | POA: Diagnosis not present

## 2018-12-23 DIAGNOSIS — N3941 Urge incontinence: Secondary | ICD-10-CM | POA: Diagnosis not present

## 2018-12-26 ENCOUNTER — Other Ambulatory Visit: Payer: Self-pay | Admitting: Family Medicine

## 2018-12-26 NOTE — Telephone Encounter (Signed)
I think that this came to the wrong box. Thanks!

## 2018-12-27 NOTE — Telephone Encounter (Signed)
I checked with Pt about the rx and she informed me that she has enough.

## 2018-12-27 NOTE — Telephone Encounter (Signed)
Check with patient- she should not be due until next month

## 2018-12-28 DIAGNOSIS — R69 Illness, unspecified: Secondary | ICD-10-CM | POA: Diagnosis not present

## 2019-01-09 ENCOUNTER — Other Ambulatory Visit: Payer: Self-pay | Admitting: Family Medicine

## 2019-01-10 DIAGNOSIS — R69 Illness, unspecified: Secondary | ICD-10-CM | POA: Diagnosis not present

## 2019-01-11 DIAGNOSIS — R69 Illness, unspecified: Secondary | ICD-10-CM | POA: Diagnosis not present

## 2019-01-26 ENCOUNTER — Ambulatory Visit: Payer: Self-pay | Admitting: Family Medicine

## 2019-01-26 ENCOUNTER — Encounter: Payer: Self-pay | Admitting: Family Medicine

## 2019-01-26 ENCOUNTER — Ambulatory Visit (INDEPENDENT_AMBULATORY_CARE_PROVIDER_SITE_OTHER): Payer: Medicare HMO | Admitting: Family Medicine

## 2019-01-26 VITALS — BP 108/66 | HR 75 | Temp 97.0°F | Ht 67.5 in | Wt 162.0 lb

## 2019-01-26 DIAGNOSIS — E785 Hyperlipidemia, unspecified: Secondary | ICD-10-CM | POA: Diagnosis not present

## 2019-01-26 DIAGNOSIS — G894 Chronic pain syndrome: Secondary | ICD-10-CM

## 2019-01-26 DIAGNOSIS — K219 Gastro-esophageal reflux disease without esophagitis: Secondary | ICD-10-CM

## 2019-01-26 DIAGNOSIS — I480 Paroxysmal atrial fibrillation: Secondary | ICD-10-CM | POA: Diagnosis not present

## 2019-01-26 DIAGNOSIS — R69 Illness, unspecified: Secondary | ICD-10-CM | POA: Diagnosis not present

## 2019-01-26 DIAGNOSIS — F332 Major depressive disorder, recurrent severe without psychotic features: Secondary | ICD-10-CM

## 2019-01-26 MED ORDER — RIVAROXABAN 20 MG PO TABS: ORAL_TABLET | ORAL | 3 refills | Status: DC

## 2019-01-26 MED ORDER — HYDROCODONE-ACETAMINOPHEN 5-325 MG PO TABS: 0.5000 | ORAL_TABLET | Freq: Two times a day (BID) | ORAL | 0 refills | Status: DC | PRN

## 2019-01-26 MED ORDER — OMEPRAZOLE 20 MG PO CPDR: DELAYED_RELEASE_CAPSULE | ORAL | 3 refills | Status: DC

## 2019-01-26 MED ORDER — FLECAINIDE ACETATE 100 MG PO TABS: 100.0000 mg | ORAL_TABLET | Freq: Two times a day (BID) | ORAL | 3 refills | Status: DC

## 2019-01-26 MED ORDER — ATORVASTATIN CALCIUM 20 MG PO TABS: 20.0000 mg | ORAL_TABLET | Freq: Every day | ORAL | 3 refills | Status: DC

## 2019-01-26 NOTE — Assessment & Plan Note (Signed)
S:doing well with omeprazole twice a day at 20mg  for reflux. b12 was normal in february Lab Results  Component Value Date   VITAMINB12 905 10/28/2018  A/P:  Stable. Continue current medications.  Would love to get around lower dose

## 2019-01-26 NOTE — Assessment & Plan Note (Signed)
S:still working with Dr. Donell Beers and therapist- has had some recent medication adjustments that have been planned- had gabapentin increased to two a night to start but has other changes coming.planned   Dystonia issues stopped and she stopped sinemet from Dr. Artis Flock A/P: Still with mild poor control but working with Dr. Donell Beers to make adjustments-fortunately no dystonia recently.  Continue close follow-up with psychiatry

## 2019-01-26 NOTE — Progress Notes (Signed)
Phone [phone]   Subjective:  Virtual visit via Video note. Chief complaint: Chief Complaint  Patient presents with  . Follow-up    3 month    This visit type was conducted due to national recommendations for restrictions regarding the COVID-19 Pandemic (e.g. social distancing).  This format is felt to be most appropriate for this patient at this time balancing risks to patient and risks to population by having him in for in person visit.  No physical exam was performed (except for noted visual exam or audio findings with Telehealth visits).    Our team/I connected with Danielle Rocha on 01/26/19 at  8:00 AM EDT by a video enabled telemedicine application ([email or web address]) and verified that I am speaking with the correct person using two identifiers.  Location patient: [place] Location provider: [facility], office Persons participating in the virtual visit:  patient  Our team/I discussed the limitations of evaluation and management by telemedicine and the availability of in person appointments. In light of current covid-19 pandemic, patient also understands that we are trying to protect them by minimizing in office contact if at all possible.  The patient expressed consent for telemedicine visit and agreed to proceed. Patient understands insurance will be billed.   ROS- no fever/chills/cough/shortness of breath/sore throat/chest pain   Past Medical History-  Patient Active Problem List   Diagnosis Date Noted  . Major depressive disorder, recurrent severe without psychotic features (HCC) 09/02/2016    Priority: High  . History of SI/intentional drug overdose 11/22/2015    Priority: High  . Atrial fibrillation (HCC)     Priority: High  . Chronic pain syndrome     Priority: High  . Hyperlipidemia     Priority: Medium  . GERD (gastroesophageal reflux disease) 01/30/2016    Priority: Low  . Restless legs 07/27/2018  . UTI (urinary tract infection) 10/07/2016    Medications-  reviewed and updated Current Outpatient Medications  Medication Sig Dispense Refill  . acetaminophen (TYLENOL) 500 MG tablet Take 1,000 mg by mouth every 6 (six) hours as needed for mild pain, moderate pain, fever or headache.    Marland Kitchen atorvastatin (LIPITOR) 20 MG tablet Take 1 tablet (20 mg total) by mouth daily. 90 tablet 3  . cholecalciferol (VITAMIN D) 1000 units tablet Take 1,000 Units by mouth daily.    Marland Kitchen co-enzyme Q-10 30 MG capsule Take 30 mg by mouth daily.    Marland Kitchen escitalopram (LEXAPRO) 10 MG tablet Take 10 mg by mouth daily.    Marland Kitchen escitalopram (LEXAPRO) 10 MG tablet     . flecainide (TAMBOCOR) 100 MG tablet Take 1 tablet (100 mg total) by mouth 2 (two) times daily. 180 tablet 3  . gabapentin (NEURONTIN) 300 MG capsule Take 300 mg by mouth at bedtime.    Marland Kitchen HYDROcodone-acetaminophen (NORCO/VICODIN) 5-325 MG tablet Take 0.5-1 tablets by mouth 2 (two) times daily as needed for moderate pain or severe pain (may fill in 1 month). 30 tablet 0  . HYDROcodone-acetaminophen (NORCO/VICODIN) 5-325 MG tablet Take 0.5-1 tablets by mouth 2 (two) times daily as needed for moderate pain or severe pain (may fill in 2 months). 30 tablet 0  . HYDROcodone-acetaminophen (NORCO/VICODIN) 5-325 MG tablet Take 0.5-1 tablets by mouth 2 (two) times daily as needed for moderate pain or severe pain (may fill today). 30 tablet 0  . Melatonin 3 MG TABS Take 2 tablets by mouth at bedtime.     . Multiple Vitamin (MULTIVITAMIN WITH MINERALS) TABS tablet Take 1 tablet by  mouth daily.    Marland Kitchen OLANZapine (ZYPREXA) 20 MG tablet Take 20 mg by mouth at bedtime.    Marland Kitchen omeprazole (PRILOSEC) 20 MG capsule TAKE 1 CAPSULE BY MOUTH TWICE DAILY BEFORE MEAL(S) 180 capsule 3  . rivaroxaban (XARELTO) 20 MG TABS tablet TAKE 1 TABLET BY MOUTH ONCE DAILY WITH  SUPPER 90 tablet 3  . traZODone (DESYREL) 150 MG tablet Take 300 mg by mouth at bedtime.     No current facility-administered medications for this visit.      Objective:  BP 108/66    Pulse 75   Temp (!) 97 F (36.1 C)   Ht 5' 7.5" (1.715 m)   Wt 162 lb (73.5 kg)   LMP  (LMP Unknown)   BMI 25.00 kg/m  self reported vitals Gen: NAD, resting comfortably Lungs: nonlabored, normal respiratory rate  Skin: appears dry, no obvious rash    Assessment and Plan   #Chronic pain syndrome-on disability related to this S:recently has been in steady state with pain on half tablet of hydrocodone twice a day- with a lot of walking or exercise it will bother her. Recently around 5/10 most of the time which is pretty good for her  A/P: Actually has had reasonable control for her recently-thankful for this- refilled hydrocodone 5/325-she uses a total of 1 tablet/day - NCCSRS reviewed- no high risk utilization-only getting Rx through our office - no recent SI (monitoring with prior suicide attempts by overdose and thus limit on # of pills to 30 at a time of hydrocodone)  -UDS 04/26/2018 - Pain contract Feb 03, 2018  #GERD S:doing well with omeprazole twice a day at 20mg  for reflux. b12 was normal in february Lab Results  Component Value Date   VITAMINB12 905 10/28/2018  A/P:  Stable. Continue current medications.  Would love to get around lower dose  #Major depression, recurrent, severe without psychotic features S:still working with Danielle Rocha and therapist- has had some recent medication adjustments that have been planned- had gabapentin increased to two a night to start but has other changes coming.planned   Dystonia issues stopped and she stopped sinemet from Danielle Rocha A/P: Still with mild poor control but working with Danielle Rocha to make adjustments-fortunately no dystonia recently.  Continue close follow-up with psychiatry  #hyperlipidemia S:  controlled on atorvastatin 20mg  Lab Results  Component Value Date   CHOL 198 10/28/2018   HDL 43.20 10/28/2018   LDLCALC 75 10/28/2017   LDLDIRECT 122.0 10/28/2018   TRIG 231.0 (H) 10/28/2018   CHOLHDL 5 10/28/2018   A/P: Doing  well-Next full check would be February 2021 or later  #Atrial fibrillation S: Doing well on Xarelto for anticoagulation.  Also on flecainide as antiarrhythmic- has been released from cardiology as long as stable A/P:  Stable. Continue current medications.     Other notes: 1.they are practicing social distancing well. Husband is doing shopping since he is already out working- he is wearing mask and washing hands well.  2.  For entertainment-playing badmitton in yard, playing board games   3 months in person follow-up Future Appointments  Date Time Provider Department Center  04/28/2019  1:40 PM Shelva Majestic, MD [facility]   Lab/Order associations: Chronic pain syndrome  Gastroesophageal reflux disease without esophagitis  Major depressive disorder, recurrent severe without psychotic features (HCC)  Hyperlipidemia, unspecified hyperlipidemia type  Meds ordered this encounter  Medications  . HYDROcodone-acetaminophen (NORCO/VICODIN) 5-325 MG tablet    Sig: Take 0.5-1 tablets by mouth  2 (two) times daily as needed for moderate pain or severe pain (may fill in 1 month).    Dispense:  30 tablet    Refill:  0  . HYDROcodone-acetaminophen (NORCO/VICODIN) 5-325 MG tablet    Sig: Take 0.5-1 tablets by mouth 2 (two) times daily as needed for moderate pain or severe pain (may fill in 2 months).    Dispense:  30 tablet    Refill:  0  . HYDROcodone-acetaminophen (NORCO/VICODIN) 5-325 MG tablet    Sig: Take 0.5-1 tablets by mouth 2 (two) times daily as needed for moderate pain or severe pain (may fill today).    Dispense:  30 tablet    Refill:  0  . atorvastatin (LIPITOR) 20 MG tablet    Sig: Take 1 tablet (20 mg total) by mouth daily.    Dispense:  90 tablet    Refill:  3  . flecainide (TAMBOCOR) 100 MG tablet    Sig: Take 1 tablet (100 mg total) by mouth 2 (two) times daily.    Dispense:  180 tablet    Refill:  3  . rivaroxaban (XARELTO) 20 MG TABS tablet    Sig: TAKE 1 TABLET  BY MOUTH ONCE DAILY WITH  SUPPER    Dispense:  90 tablet    Refill:  3  . omeprazole (PRILOSEC) 20 MG capsule    Sig: TAKE 1 CAPSULE BY MOUTH TWICE DAILY BEFORE MEAL(S)    Dispense:  180 capsule    Refill:  3    Return precautions advised.  Tana ConchStephen Clydette Privitera, MD

## 2019-01-26 NOTE — Patient Instructions (Signed)
There are no preventive care reminders to display for this patient.  Depression screen [facility] 2/9 10/28/2018 07/29/2018 07/27/2018  Decreased Interest 1 (No Data) 1  Down, Depressed, Hopeless 1 - 2  PHQ - 2 Score 2 - 3  Altered sleeping 2 - 1  Tired, decreased energy 3 - 3  Change in appetite 0 - 0  Feeling bad or failure about yourself  1 - 1  Trouble concentrating 1 - 1  Moving slowly or fidgety/restless 0 - 0  Suicidal thoughts 1 - 1  PHQ-9 Score 10 - 10  Difficult doing work/chores Somewhat difficult - Somewhat difficult   Video visit

## 2019-02-01 DIAGNOSIS — R69 Illness, unspecified: Secondary | ICD-10-CM | POA: Diagnosis not present

## 2019-02-22 DIAGNOSIS — R69 Illness, unspecified: Secondary | ICD-10-CM | POA: Diagnosis not present

## 2019-03-11 DIAGNOSIS — R69 Illness, unspecified: Secondary | ICD-10-CM | POA: Diagnosis not present

## 2019-04-18 DIAGNOSIS — R69 Illness, unspecified: Secondary | ICD-10-CM | POA: Diagnosis not present

## 2019-04-21 ENCOUNTER — Telehealth: Payer: Self-pay | Admitting: Physical Therapy

## 2019-04-21 NOTE — Telephone Encounter (Signed)
Pt called to r/s her appt originally scheduled for 04/28/19 due to being out of town.  Got her r/s for 05/04/19 but she states that she will run out of her hydrocodone medication on 04/28/19.  I advised the pt that I would send a message to see if she could get a temporary refill x 6 days to get her through until her appt on 05/04/19.  Please advise.

## 2019-04-22 ENCOUNTER — Telehealth: Payer: Self-pay | Admitting: Family Medicine

## 2019-04-22 NOTE — Telephone Encounter (Signed)
Pt canceled 3 month appt. I advised pt that message would have to be routed to Dr. Yong Channel. Pt also notified that Dr. Yong Channel is not in the office until Tuesday. Pt verbalized understanding.

## 2019-04-22 NOTE — Telephone Encounter (Signed)
Message routed to PCP for advise   FYI pt knows that you are out of the office Until Tuesaday

## 2019-04-22 NOTE — Telephone Encounter (Signed)
Pt is going to run out of her hydrocodone on 8/6 - she has an appt on 8/12.  She would like enough to get her through until the 12th, when she can get a refill.  Please call her back after talking to Dr Yong Channel.

## 2019-04-25 DIAGNOSIS — R69 Illness, unspecified: Secondary | ICD-10-CM | POA: Diagnosis not present

## 2019-04-25 MED ORDER — HYDROCODONE-ACETAMINOPHEN 5-325 MG PO TABS: 0.5000 | ORAL_TABLET | Freq: Two times a day (BID) | ORAL | 0 refills | Status: DC | PRN

## 2019-04-25 NOTE — Addendum Note (Signed)
Addended by: Marin Olp on: 04/25/2019 09:12 AM   Modules accepted: Orders

## 2019-04-25 NOTE — Telephone Encounter (Signed)
Called pt and advised. Pt verbalized understanding.  

## 2019-04-25 NOTE — Telephone Encounter (Signed)
I went ahead and refilled since she is scheduled for the 12th- she must keep that appointment for further refills-the remaining 2 refills

## 2019-04-26 ENCOUNTER — Other Ambulatory Visit: Payer: Self-pay | Admitting: Family Medicine

## 2019-04-28 ENCOUNTER — Ambulatory Visit: Payer: Medicare HMO | Admitting: Family Medicine

## 2019-05-03 NOTE — Progress Notes (Signed)
Phone [phone]   Subjective:  Virtual visit via Video note. Chief complaint: Chief Complaint  Patient presents with  . Follow-up  . Atrial Fibrillation  . Depression  . Hyperlipidemia  . Gastroesophageal Reflux    This visit type was conducted due to national recommendations for restrictions regarding the COVID-19 Pandemic (e.g. social distancing).  This format is felt to be most appropriate for this patient at this time balancing risks to patient and risks to population by having him in for in person visit.  No physical exam was performed (except for noted visual exam or audio findings with Telehealth visits).    Our team/I connected with Danielle Rocha at  8:40 AM EDT by a video enabled telemedicine application (doxy.me or caregility through epic) and verified that I am speaking with the correct person using two identifiers.  Location patient: [place] Location provider: [facility], office Persons participating in the virtual visit:  patient  Our team/I discussed the limitations of evaluation and management by telemedicine and the availability of in person appointments. In light of current covid-19 pandemic, patient also understands that we are trying to protect them by minimizing in office contact if at all possible.  The patient expressed consent for telemedicine visit and agreed to proceed. Patient understands insurance will be billed.   ROS-anhedonia and depressed mood noted.  No fever chills reported.  No suicidal thoughts.  Past Medical History-  Patient Active Problem List   Diagnosis Date Noted  . Major depressive disorder, recurrent severe without psychotic features ([place]) 09/02/2016    Priority: High  . History of SI/intentional drug overdose 11/22/2015    Priority: High  . Atrial fibrillation ([place])     Priority: High  . Chronic pain syndrome     Priority: High  . Hyperlipidemia     Priority: Medium  . GERD (gastroesophageal reflux disease) 01/30/2016   Priority: Low  . Restless legs 07/27/2018  . UTI (urinary tract infection) 10/07/2016    Medications- reviewed and updated Current Outpatient Medications  Medication Sig Dispense Refill  . acetaminophen (TYLENOL) 500 MG tablet Take 1,000 mg by mouth every 6 (six) hours as needed for mild pain, moderate pain, fever or headache.    Marland Kitchen atorvastatin (LIPITOR) 20 MG tablet Take 1 tablet (20 mg total) by mouth daily. 90 tablet 3  . buPROPion (WELLBUTRIN XL) 300 MG 24 hr tablet Take 300 mg by mouth every morning.    . cephALEXin (KEFLEX) 250 MG capsule Take 250 mg by mouth at bedtime.    . cholecalciferol (VITAMIN D) 1000 units tablet Take 1,000 Units by mouth daily.    Marland Kitchen co-enzyme Q-10 30 MG capsule Take 30 mg by mouth daily.    . Cyanocobalamin (B-12 PO) Take by mouth.    Lyndle Herrlich SULFATE PO Take by mouth.    . flecainide (TAMBOCOR) 100 MG tablet Take 1 tablet (100 mg total) by mouth 2 (two) times daily. 180 tablet 3  . gabapentin (NEURONTIN) 300 MG capsule Take 900 mg by mouth at bedtime.     Marland Kitchen HYDROcodone-acetaminophen (NORCO/VICODIN) 5-325 MG tablet Take 0.5-1 tablets by mouth 2 (two) times daily as needed for moderate pain or severe pain (may fill in 1 month). 30 tablet 0  . HYDROcodone-acetaminophen (NORCO/VICODIN) 5-325 MG tablet Take 0.5-1 tablets by mouth 2 (two) times daily as needed for moderate pain or severe pain (may fill in 2 months). 30 tablet 0  . HYDROcodone-acetaminophen (NORCO/VICODIN) 5-325 MG tablet Take 0.5-1 tablets by mouth 2 (  two) times daily as needed for moderate pain or severe pain (may fill today). 30 tablet 0  . Melatonin 3 MG TABS Take 2 tablets by mouth at bedtime.     . Multiple Vitamin (MULTIVITAMIN WITH MINERALS) TABS tablet Take 1 tablet by mouth daily.    Marland Kitchen. OLANZapine (ZYPREXA) 20 MG tablet Take 5 mg by mouth at bedtime.     Marland Kitchen. omeprazole (PRILOSEC) 20 MG capsule Take 1 capsule (20 mg total) by mouth 2 (two) times daily before a meal. 180 capsule 3  .  rivaroxaban (XARELTO) 20 MG TABS tablet TAKE 1 TABLET BY MOUTH ONCE DAILY WITH  SUPPER 90 tablet 3  . traZODone (DESYREL) 150 MG tablet Take 300 mg by mouth at bedtime.     No current facility-administered medications for this visit.      Objective:  BP 90/61   Pulse 65   Ht 5' 7.5" (1.715 m)   Wt 164 lb 3.2 oz (74.5 kg)   LMP  (LMP Unknown)   BMI 25.34 kg/m  self reported vitals Gen: NAD, resting comfortably Lungs: nonlabored, normal respiratory rate  Skin: appears dry, no obvious rash    Assessment and Plan   # Chronic pain syndrome S: Pain has increased from 5 out of 10 most of the time last visit which was pretty good for her now up to 6 or 7 out of 10 for the most part.  She is still taking half a tablet of hydrocodone twice a day most of the time. Pain primarily located in hips over last 3 months but also back if up moving a lot.   No recent thoughts of self harm- one limitation we have had on increasing dose A/P: Mild worsening control-hesitant to increase medication due to prior suicide attempts with pain medication- thankful she is following up closely with psychiatry and does not appear to have worsening symptoms-actually seems to be improving with recent modifications - NCCSRS review today and no high risk utilization -UDS 04/26/18 - pain contract Feb 03, 2018  # A fib S:Taking Xarelto 20 mg daily.  Also taking flecainide for antiarrythmic- cardiology ahs been ok with us prescribing as long as stable A/P:  Stable. Continue current medications.     # Depression S: working with Dr. Donell BeersPlovsky- lowered zyprexa to 5 mg, started wellbutrin 300mg  XR. No longer Taking Escitalopram 10 mg daily.  She has noticed a difference. Has not noted significant energy increase though [facility] screen [facility] 2[facility]9 05/04/2019  Decreased Interest 2  Down, Depressed, Hopeless 2  PHQ - 2 Score 4  Altered sleeping 0  Tired, decreased energy 0  Change in appetite 1  Feeling bad or failure about  yourself  1  Trouble concentrating 2  Moving slowly or fidgety/restless 0  Suicidal thoughts 1  PHQ-9 Score 9  Difficult doing work/chores Somewhat difficult  A/P: Making some improvements-partial remission- encouraged patient to continue close follow-up with psychiatry.  Thankful no suicidal ideation- has had thoughts that may be better off dead intermittently but no thoughts of physically harming herself  #hyperlipidemia S: mild poorly controlled on Atorvastatin 20 mg daily.  Mild poor control last visit Lab Results  Component Value Date   CHOL 198 10/28/2018   HDL 43.20 10/28/2018   LDLDIRECT 122.0 10/28/2018   TRIG 231.0 (H) 10/28/2018   CHOLHDL 5 10/28/2018   A/P: if LDL remains above 100 at physical- we discussed possibly increasing to 40mg  dose.   # GERD S:Taking Omeprazole 20 mg  twice daily.  Breakthrough when takes once a day A/P: doing well- continue current dose.  B12 was normal in February.  Do not think we can titrate down given breakthrough symptoms when tries once daily  # overweight S: Trying to eat healthy diet-weight up slightly Trying to do some walking each day- goes around [place]- 20 mins Wt Readings from Last 3 Encounters:  05/04/19 164 lb 3.2 oz (74.5 kg)  01/26/19 162 lb (73.5 kg)  11/10/18 168 lb (76.2 kg)  A/P: Mild poor control-BMI just over 25- she seems to be doing the best she can given limitations with pain- Encouraged need for healthy eating, regular exercise, weight loss.    Recommended follow up: physical in February- but 3 month pain medicine check in virtually   Lab/Order associations:   ICD-10-CM   1. Chronic pain syndrome  G89.4   2. Paroxysmal atrial fibrillation (HCC)  I48.0   3. Hyperlipidemia, unspecified hyperlipidemia type  E78.5   4. Recurrent major depressive disorder, in partial remission (HCC)  F33.41   5. Gastroesophageal reflux disease without esophagitis  K21.9   6. Overweight  E66.3     Meds ordered  this encounter  Medications  . HYDROcodone-acetaminophen (NORCO/VICODIN) 5-325 MG tablet    Sig: Take 0.5-1 tablets by mouth 2 (two) times daily as needed for moderate pain or severe pain (may fill in 1 month).    Dispense:  30 tablet    Refill:  0  . HYDROcodone-acetaminophen (NORCO/VICODIN) 5-325 MG tablet    Sig: Take 0.5-1 tablets by mouth 2 (two) times daily as needed for moderate pain or severe pain (may fill in 2 months).    Dispense:  30 tablet    Refill:  0  . HYDROcodone-acetaminophen (NORCO/VICODIN) 5-325 MG tablet    Sig: Take 0.5-1 tablets by mouth 2 (two) times daily as needed for moderate pain or severe pain (may fill today).    Dispense:  30 tablet    Refill:  0  . omeprazole (PRILOSEC) 20 MG capsule    Sig: Take 1 capsule (20 mg total) by mouth 2 (two) times daily before a meal.    Dispense:  180 capsule    Refill:  3   Return precautions advised.  Tana ConchStephen Yisel Megill, MD

## 2019-05-03 NOTE — Patient Instructions (Signed)
Health Maintenance Due  Topic Date Due  . INFLUENZA VACCINE  04/23/2019    Depression screen [facility] 2/9 10/28/2018 07/29/2018 07/27/2018  Decreased Interest 1 (No Data) 1  Down, Depressed, Hopeless 1 - 2  PHQ - 2 Score 2 - 3  Altered sleeping 2 - 1  Tired, decreased energy 3 - 3  Change in appetite 0 - 0  Feeling bad or failure about yourself  1 - 1  Trouble concentrating 1 - 1  Moving slowly or fidgety/restless 0 - 0  Suicidal thoughts 1 - 1  PHQ-9 Score 10 - 10  Difficult doing work/chores Somewhat difficult - Somewhat difficult

## 2019-05-04 ENCOUNTER — Other Ambulatory Visit: Payer: Self-pay

## 2019-05-04 ENCOUNTER — Encounter: Payer: Self-pay | Admitting: Family Medicine

## 2019-05-04 ENCOUNTER — Ambulatory Visit (INDEPENDENT_AMBULATORY_CARE_PROVIDER_SITE_OTHER): Payer: Medicare HMO | Admitting: Family Medicine

## 2019-05-04 VITALS — BP 90/61 | HR 65 | Ht 67.5 in | Wt 164.2 lb

## 2019-05-04 DIAGNOSIS — K219 Gastro-esophageal reflux disease without esophagitis: Secondary | ICD-10-CM

## 2019-05-04 DIAGNOSIS — F3341 Major depressive disorder, recurrent, in partial remission: Secondary | ICD-10-CM

## 2019-05-04 DIAGNOSIS — G894 Chronic pain syndrome: Secondary | ICD-10-CM

## 2019-05-04 DIAGNOSIS — E663 Overweight: Secondary | ICD-10-CM | POA: Diagnosis not present

## 2019-05-04 DIAGNOSIS — E785 Hyperlipidemia, unspecified: Secondary | ICD-10-CM

## 2019-05-04 DIAGNOSIS — I480 Paroxysmal atrial fibrillation: Secondary | ICD-10-CM

## 2019-05-04 DIAGNOSIS — R69 Illness, unspecified: Secondary | ICD-10-CM | POA: Diagnosis not present

## 2019-05-04 MED ORDER — HYDROCODONE-ACETAMINOPHEN 5-325 MG PO TABS: 0.5000 | ORAL_TABLET | Freq: Two times a day (BID) | ORAL | 0 refills | Status: DC | PRN

## 2019-05-04 MED ORDER — OMEPRAZOLE 20 MG PO CPDR: 20.0000 mg | DELAYED_RELEASE_CAPSULE | Freq: Two times a day (BID) | ORAL | 3 refills | Status: DC

## 2019-05-23 DIAGNOSIS — R69 Illness, unspecified: Secondary | ICD-10-CM | POA: Diagnosis not present

## 2019-05-26 DIAGNOSIS — N3941 Urge incontinence: Secondary | ICD-10-CM | POA: Diagnosis not present

## 2019-05-27 DIAGNOSIS — R69 Illness, unspecified: Secondary | ICD-10-CM | POA: Diagnosis not present

## 2019-06-01 DIAGNOSIS — R69 Illness, unspecified: Secondary | ICD-10-CM | POA: Diagnosis not present

## 2019-06-05 DIAGNOSIS — S93401A Sprain of unspecified ligament of right ankle, initial encounter: Secondary | ICD-10-CM | POA: Diagnosis not present

## 2019-06-15 DIAGNOSIS — R69 Illness, unspecified: Secondary | ICD-10-CM | POA: Diagnosis not present

## 2019-07-06 DIAGNOSIS — R69 Illness, unspecified: Secondary | ICD-10-CM | POA: Diagnosis not present

## 2019-07-17 DIAGNOSIS — H524 Presbyopia: Secondary | ICD-10-CM | POA: Diagnosis not present

## 2019-07-17 DIAGNOSIS — Z0100 Encounter for examination of eyes and vision without abnormal findings: Secondary | ICD-10-CM | POA: Diagnosis not present

## 2019-08-03 ENCOUNTER — Encounter: Payer: Self-pay | Admitting: Family Medicine

## 2019-08-03 ENCOUNTER — Ambulatory Visit (INDEPENDENT_AMBULATORY_CARE_PROVIDER_SITE_OTHER): Payer: Medicare HMO | Admitting: Family Medicine

## 2019-08-03 VITALS — BP 107/59 | HR 66 | Ht 67.5 in | Wt 162.8 lb

## 2019-08-03 DIAGNOSIS — K219 Gastro-esophageal reflux disease without esophagitis: Secondary | ICD-10-CM | POA: Diagnosis not present

## 2019-08-03 DIAGNOSIS — R69 Illness, unspecified: Secondary | ICD-10-CM | POA: Diagnosis not present

## 2019-08-03 DIAGNOSIS — G2581 Restless legs syndrome: Secondary | ICD-10-CM

## 2019-08-03 DIAGNOSIS — N390 Urinary tract infection, site not specified: Secondary | ICD-10-CM

## 2019-08-03 DIAGNOSIS — E785 Hyperlipidemia, unspecified: Secondary | ICD-10-CM

## 2019-08-03 DIAGNOSIS — F332 Major depressive disorder, recurrent severe without psychotic features: Secondary | ICD-10-CM

## 2019-08-03 DIAGNOSIS — I480 Paroxysmal atrial fibrillation: Secondary | ICD-10-CM

## 2019-08-03 DIAGNOSIS — G894 Chronic pain syndrome: Secondary | ICD-10-CM | POA: Diagnosis not present

## 2019-08-03 MED ORDER — HYDROCODONE-ACETAMINOPHEN 5-325 MG PO TABS: 0.5000 | ORAL_TABLET | Freq: Two times a day (BID) | ORAL | 0 refills | Status: DC | PRN

## 2019-08-03 NOTE — Progress Notes (Signed)
Phone [phone] Virtual visit via Video note   Subjective:  Chief complaint: Chief Complaint  Patient presents with  . Follow-up    This visit type was conducted due to national recommendations for restrictions regarding the COVID-19 Pandemic (e.g. social distancing).  This format is felt to be most appropriate for this patient at this time balancing risks to patient and risks to population by having him in for in person visit.  No physical exam was performed (except for noted visual exam or audio findings with Telehealth visits).    Our team/I connected with Smitty Pluck at  8:00 AM EST by a video enabled telemedicine application (doxy.me or caregility through epic) and verified that I am speaking with the correct person using two identifiers.  Location patient: Home[place] Location provider: [facility], office Persons participating in the virtual visit:  patient  Our team/I discussed the limitations of evaluation and management by telemedicine and the availability of in person appointments. In light of current covid-19 pandemic, patient also understands that we are trying to protect them by minimizing in office contact if at all possible.  The patient expressed consent for telemedicine visit and agreed to proceed. Patient understands insurance will be billed.   Gastrointestinal ROS: no abdominal pain, change in bowel habits, or black or bloody stools. No chest pain or shortness of breath. No headache or blurry vision.    Past Medical History-  Patient Active Problem List   Diagnosis Date Noted  . Major depressive disorder, recurrent severe without psychotic features ([place]) 09/02/2016    Priority: High  . History of SI/intentional drug overdose 11/22/2015    Priority: High  . Atrial fibrillation ([place])     Priority: High  . Chronic pain syndrome     Priority: High  . Hyperlipidemia     Priority: Medium  . GERD (gastroesophageal reflux disease) 01/30/2016    Priority: Low  .  Restless legs 07/27/2018  . UTI (urinary tract infection) 10/07/2016    Medications- reviewed and updated Current Outpatient Medications  Medication Sig Dispense Refill  . acetaminophen (TYLENOL) 500 MG tablet Take 1,000 mg by mouth every 6 (six) hours as needed for mild pain, moderate pain, fever or headache.    Marland Kitchen atorvastatin (LIPITOR) 20 MG tablet Take 1 tablet (20 mg total) by mouth daily. 90 tablet 3  . buPROPion (WELLBUTRIN XL) 300 MG 24 hr tablet Take 300 mg by mouth every morning.    . cephALEXin (KEFLEX) 250 MG capsule Take 250 mg by mouth at bedtime.    . cholecalciferol (VITAMIN D) 1000 units tablet Take 1,000 Units by mouth daily.    Marland Kitchen co-enzyme Q-10 30 MG capsule Take 30 mg by mouth daily.    . Cyanocobalamin (B-12 PO) Take by mouth.    Lyndle Herrlich SULFATE PO Take by mouth.    . flecainide (TAMBOCOR) 100 MG tablet Take 1 tablet (100 mg total) by mouth 2 (two) times daily. 180 tablet 3  . gabapentin (NEURONTIN) 300 MG capsule Take 900 mg by mouth at bedtime.     Marland Kitchen HYDROcodone-acetaminophen (NORCO/VICODIN) 5-325 MG tablet Take 0.5 tablets by mouth 2 (two) times daily as needed for moderate pain (may fill in 2 months). 1/2 to 1 tab bid 30 tablet 0  . Melatonin 3 MG TABS Take 2 tablets by mouth at bedtime.     . Multiple Vitamin (MULTIVITAMIN WITH MINERALS) TABS tablet Take 1 tablet by mouth daily.    Marland Kitchen omeprazole (PRILOSEC) 20 MG capsule Take  1 capsule (20 mg total) by mouth 2 (two) times daily before a meal. 180 capsule 3  . rivaroxaban (XARELTO) 20 MG TABS tablet TAKE 1 TABLET BY MOUTH ONCE DAILY WITH  SUPPER 90 tablet 3  . traZODone (DESYREL) 150 MG tablet Take 300 mg by mouth at bedtime.    Marland Kitchen. HYDROcodone-acetaminophen (NORCO) 5-325 MG tablet Take 0.5 tablets by mouth 2 (two) times daily as needed for moderate pain (may refill in 1 month). 30 tablet 0  . HYDROcodone-acetaminophen (NORCO) 5-325 MG tablet Take 0.5 tablets by mouth 2 (two) times daily as needed for moderate pain (may  fill in 1 month). 1/2 to 1 tab bid. May refill today 30 tablet 0   No current facility-administered medications for this visit.      Objective:  BP (!) 107/59   Pulse 66   Ht 5' 7.5" (1.715 m)   Wt 162 lb 12.8 oz (73.8 kg)   LMP  (LMP Unknown)   BMI 25.12 kg/m  self reported vitals Gen: NAD, resting comfortably Lungs: nonlabored, normal respiratory rate  Skin: appears dry, no obvious rash    Assessment and Plan   #social update- she is glad didn't go back to church- pastor ended up with covid-19. Had not been wearing masks in the church- services cancelled  #Atrial fibrillation S:Compliant with Xarelto for anticoagulation.  Flecainide as an antiarrhythmic.  Does not follow with cardiology regularly.  No recent issues feeling like she has palpitations/chest pain/shortness of breath.   A/P: Stable not clearly in a fib. Continue current medications.    #Chronic pain syndrome S:On disability due to chronic pain syndrome. Extensive prior orthopedic workup. issues mainly in low back and hips.  Hydrocodone 5/325 once per day-either takes full tablet once or splits and takes twice a day. On gabapentin at bedtime from psychiatry- not sure this helps with pain.  - pain largely stable over last 3 months. Hydrocodone enables her to actually get out and enjoy activities like camping or family trips.  -Pain worsened with PT A/P:  Still imperfect control but medicine is allowing improved # of activities. Will continue current dose.   -UDS 04/26/2018- we opted to do at physical in February to hopefully repeat this - Pain contract 01/24/2018 -[facility] reviewed today and no abnormal fill pattern     #Major depressive disorder S:working with psychiatry Dr. Donell BeersPlovsky. Previously On zyprexa 20mg , gabepentin, trazodone.   Continues counseling. D/c zyprexa recently now on Wellbutrin PHQ-9 done today. Tongue movement and biting of the tongue with wellbutrin and has visit Friday to discuss.  A/P:  Reasonably  well controlled but still does have some depressed mood but high side effects  On current meds and is going to work with psychiatry to make adjustments.    #Hyperlipidemia S:compliant atorvastatin 20mg . LDL goal at least under 100. Tries to eat balanced diet. Exercise poor due to pain.  A/P:  Last LDL slightly high on atorvastatin 20 mg. We opted to monitor over next 6 months and get bloodwork at next visit if still with LDL over 100 may increase dose - discussed possible chair exercises   #GERD S:compliant with omeprazole 20 mg twice a day. She is on b12 and folate in regards to PPI risk of lowering these.  If she misses any doses-has significant issues with reflux A/P:  Stable. Continue current medications.  With mild to reduce the medicine but unfortunately she has breakthrough when we try to reduce dose-continue current regimen for now.  #Restless  legs S:restless legs has been better with b12 and ferrous sulfate/iron.  A/P: much improved- also on gabapentin which may help  - check ferritin next visit  %Recurrent UTI- has seen urology. Had cystoscopy. On chronic keflex for prevention  Recommended follow up: February 6 of 2021 or later for physical Future Appointments  Date Time Provider Department Center  08/04/2019  8:30 AM [facility]   Lab/Order associations:   ICD-10-CM   1. Chronic pain syndrome  G89.4   2. Paroxysmal atrial fibrillation (HCC)  I48.0   3. Hyperlipidemia, unspecified hyperlipidemia type  E78.5   4. Restless legs  G25.81   5. Major depressive disorder, recurrent severe without psychotic features (HCC)  F33.2   6. Gastroesophageal reflux disease without esophagitis  K21.9     Meds ordered this encounter  Medications  . HYDROcodone-acetaminophen (NORCO) 5-325 MG tablet    Sig: Take 0.5 tablets by mouth 2 (two) times daily as needed for moderate pain (may refill in 1 month).    Dispense:  30 tablet    Refill:  0  .  HYDROcodone-acetaminophen (NORCO/VICODIN) 5-325 MG tablet    Sig: Take 0.5 tablets by mouth 2 (two) times daily as needed for moderate pain (may fill in 2 months). 1/2 to 1 tab bid    Dispense:  30 tablet    Refill:  0  . HYDROcodone-acetaminophen (NORCO) 5-325 MG tablet    Sig: Take 0.5 tablets by mouth 2 (two) times daily as needed for moderate pain (may fill in 1 month). 1/2 to 1 tab bid. May refill today    Dispense:  30 tablet    Refill:  0    Return precautions advised.  Tana Conch, MD

## 2019-08-03 NOTE — Patient Instructions (Addendum)
Recommended follow up: February 6 of 2021 or later for physical

## 2019-08-04 ENCOUNTER — Other Ambulatory Visit: Payer: Self-pay

## 2019-08-04 ENCOUNTER — Ambulatory Visit (INDEPENDENT_AMBULATORY_CARE_PROVIDER_SITE_OTHER): Payer: Medicare HMO

## 2019-08-04 DIAGNOSIS — Z0000 Encounter for general adult medical examination without abnormal findings: Secondary | ICD-10-CM | POA: Diagnosis not present

## 2019-08-04 NOTE — Progress Notes (Addendum)
I have reviewed and agree with note, evaluation, plan.  See my separate note from yesterday  Garret Reddish, MD

## 2019-08-04 NOTE — Progress Notes (Signed)
I connected with Danielle Rocha on 08/04/19 at 0830 by phone and verified that I am speaking with the correct person using two identifiers. Location patient: Home Location provider: [facility], Office Persons participating in the virtual visit: Kandis Fantasia LPN and Dr. Tana Conch    I discussed the limitations of evaluation and management by telemedicine and the availability of in person appointments. The patient expressed understanding and agreed to proceed.  Subjective:   Danielle Rocha is a 68 y.o. female who presents for Medicare Annual (Subsequent) preventive examination.  Review of Systems:   Cardiac Risk Factors include: advanced age (>71men, >36 women)    Objective:     Vitals: LMP  (LMP Unknown)   There is no height or weight on file to calculate BMI.  Advanced Directives 08/04/2019 07/29/2018 10/14/2017 09/01/2016 04/23/2016 11/22/2015 09/02/2015  Does Patient Have a Medical Advance Directive? Yes No No No No No No  Type of Advance Directive Living will;Healthcare Power of Attorney - - - - - -  Does patient want to make changes to medical advance directive? No - Patient declined - - - - - -  Copy of Healthcare Power of Attorney in Chart? No - copy requested - - - - - -  Would patient like information on creating a medical advance directive? - - No - Patient declined No - Patient declined - No - patient declined information -  Some encounter information is confidential and restricted. Go to Review Flowsheets activity to see all data.    Tobacco Social History   Tobacco Use  Smoking Status Never Smoker  Smokeless Tobacco Never Used     Counseling given: Not Answered   Clinical Intake:  Pre-visit preparation completed: Yes  Pain : No/denies pain  Diabetes: No  How often do you need to have someone help you when you read instructions, pamphlets, or other written materials from your doctor or pharmacy?: 1 - Never  Interpreter Needed?: No  Information  entered by :: Kandis Fantasia LPN  Past Medical History:  Diagnosis Date  . Arthritis    DJD, low back, thumb  . Atrial fibrillation (HCC)    Xarelto anticoagulation. Flecainide antiarrythmic   . Chicken pox   . Chronic pain syndrome    On disability. History of bilateral hip pain, low back pain. Gabapentin 400 BID, percocet 1 tablet daily per prior provider.   . Colon polyp    awaiting records  . Depression    zoloft , remeron  per psychiatry. ambien  per psychiatry to help wtih sleep element.   . Dystonia    described as psychogenic dystonia. Pain and twisting from upper chest and up ith triggers "wind, creamy food" on TID ativan per psychiatry previously.   . Fibromyalgia   . GERD (gastroesophageal reflux disease)    omeprazole OTC  . Goiter    states multiple imaging tests, has had biopsies  . Hyperlipidemia    lovastatin   . Stroke [facility])    TIA (left side of face and body decreased sensitivity than right face anRosine DoorIA (transient ischemic attack)   . Vaginal atrophy    estrace vaginal cream   Past Surgical History:  Procedure Laterality Date  . ATRIAL FIBRILLATION ABLATION    . BIOPSY THYROID    . fusion l4-l5  2000  . LAMINECTOMY     L4-L5  . OTHER SURGICAL HISTORY     tennis elbow surgery  . piriformis release  1999  .  TONSILLECTOMY AND ADENOIDECTOMY  age 27  . VAGINAL HYSTERECTOMY  1993   Family History  Problem Relation Age of Onset  . Alcohol abuse Mother   . Hypertension Father   . Alcohol abuse Father   . Pancreatic cancer Father   . Cancer Brother        spindle cell RLE  . Diabetes Brother   . Cancer Brother        tonsil cancer  . Skin cancer Brother    Social History   Socioeconomic History  . Marital status: Married    Spouse name: Not on file  . Number of children: Not on file  . Years of education: Not on file  . Highest education level: Not on file  Occupational History  . Not on file  Social Needs  .  Financial resource strain: Not on file  . Food insecurity    Worry: Not on file    Inability: Not on file  . Transportation needs    Medical: Not on file    Non-medical: Not on file  Tobacco Use  . Smoking status: Never Smoker  . Smokeless tobacco: Never Used  Substance and Sexual Activity  . Alcohol use: No    Alcohol/week: 0.0 standard drinks  . Drug use: No  . Sexual activity: Never    Partners: Male  Lifestyle  . Physical activity    Days per week: Not on file    Minutes per session: Not on file  . Stress: Not on file  Relationships  . Social Herbalist on phone: Not on file    Gets together: Not on file    Attends religious service: Not on file    Active member of club or organization: Not on file    Attends meetings of clubs or organizations: Not on file    Relationship status: Not on file  Other Topics Concern  . Not on file  Social History Narrative   Family: Married. Husband works as Geophysicist/field seismologist for [employer]. 2 children from previous marriage 1 from current. Son [name] lives with them and has down's syndrome.       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 Moved to [facility] from [id number] and became disabled in that time frame due to hip/back issues.       Hobbies: time with son    Outpatient Encounter Medications as of 08/04/2019  Medication Sig  . acetaminophen (TYLENOL) 500 MG tablet Take 1,000 mg by mouth every 6 (six) hours as needed for mild pain, moderate pain, fever or headache.  Marland Kitchen atorvastatin (LIPITOR) 20 MG tablet Take 1 tablet (20 mg total) by mouth daily.  Marland Kitchen buPROPion (WELLBUTRIN XL) 300 MG 24 hr tablet Take 300 mg by mouth every morning.  . cholecalciferol (VITAMIN D) 1000 units tablet Take 1,000 Units by mouth daily.  Marland Kitchen co-enzyme Q-10 30 MG capsule Take 30 mg by mouth daily.  . Cyanocobalamin (B-12 PO) Take by mouth.  Lyndle Herrlich SULFATE PO Take by mouth.  . flecainide (TAMBOCOR) 100 MG tablet Take 1 tablet (100  mg total) by mouth 2 (two) times daily.  Marland Kitchen gabapentin (NEURONTIN) 300 MG capsule Take 900 mg by mouth at bedtime.   Marland Kitchen HYDROcodone-acetaminophen (NORCO) 5-325 MG tablet Take 0.5 tablets by mouth 2 (two) times daily as needed for moderate pain (may refill in 1 month).  Marland Kitchen HYDROcodone-acetaminophen (NORCO) 5-325 MG tablet Take 0.5 tablets by mouth 2 (two) times daily as needed  for moderate pain (may fill in 1 month). 1/2 to 1 tab bid. May refill today  . HYDROcodone-acetaminophen (NORCO/VICODIN) 5-325 MG tablet Take 0.5 tablets by mouth 2 (two) times daily as needed for moderate pain (may fill in 2 months). 1/2 to 1 tab bid  . Melatonin 3 MG TABS Take 2 tablets by mouth at bedtime.   . Multiple Vitamin (MULTIVITAMIN WITH MINERALS) TABS tablet Take 1 tablet by mouth daily.  Marland Kitchen omeprazole (PRILOSEC) 20 MG capsule Take 1 capsule (20 mg total) by mouth 2 (two) times daily before a meal.  . rivaroxaban (XARELTO) 20 MG TABS tablet TAKE 1 TABLET BY MOUTH ONCE DAILY WITH  SUPPER  . traZODone (DESYREL) 150 MG tablet Take 300 mg by mouth at bedtime.  . [DISCONTINUED] cephALEXin (KEFLEX) 250 MG capsule Take 250 mg by mouth at bedtime.   No facility-administered encounter medications on file as of 08/04/2019.     Activities of Daily Living In your present state of health, do you have any difficulty performing the following activities: 08/04/2019 08/03/2019  Hearing? N N  Vision? N N  Difficulty concentrating or making decisions? N N  Walking or climbing stairs? N N  Dressing or bathing? N N  Doing errands, shopping? N N  Preparing Food and eating ? N -  Using the Toilet? N -  In the past six months, have you accidently leaked urine? N -  Do you have problems with loss of bowel control? N -  Managing your Medications? N -  Managing your Finances? N -  Housekeeping or managing your Housekeeping? N -  Some recent data might be hidden    Patient Care Team: Shelva Majestic, MD as PCP - General (Family  Medicine) Alfredo Martinez, MD as Consulting Physician (Urology) Pricilla Riffle, MD as Consulting Physician (Cardiology)    Assessment:   This is a routine wellness examination for [place].  Exercise Activities and Dietary recommendations Current Exercise Habits: The patient does not participate in regular exercise at present  Goals    . Patient Stated     Like some communication from her oldest son        Fall Risk Fall Risk  08/04/2019 07/29/2018 04/27/2017  Falls in the past year? 0 0 Yes  Comment - fell a while back walking down stairs to the driveway ; bruised eye and laceration on forehead. pushed teeth ; broke wrist  -  Number falls in past yr: - 0 1  Injury with Fall? 0 1 Yes  Risk for fall due to : - Impaired balance/gait (No Data)  Risk for fall due to: Comment - - Tripped down stairs  Follow up - Education provided -   Is the patient's home free of loose throw rugs in walkways, pet beds, electrical cords, etc?   yes      Grab bars in the bathroom? yes      Handrails on the stairs?   yes      Adequate lighting?   yes  Depression Screen PHQ 2/9 Scores 08/04/2019 08/03/2019 05/04/2019 10/28/2018  PHQ - 2 Score 3 3 4 2   PHQ- 9 Score 12 9 9 10      Cognitive Function- no cognitive concerns at this time   Alert? Yes         Normal Appearance? N/a  Oriented to person? Yes           Place? Yes  Time? Yes  Recall of three objects? Yes  Can  perform simple calculations? Yes  Displays appropriate judgment? Yes  Can read the correct time from a watch face? Yes    6CIT Screen 07/29/2018  What Year? 0 points  What month? 0 points  What time? 0 points  Count back from 20 0 points  Months in reverse 0 points  Repeat phrase 0 points  Total Score 0    Immunization History  Administered Date(s) Administered  . Influenza, High Dose Seasonal PF 07/29/2017, 05/27/2019  . Influenza-Unspecified 06/09/2013, 06/06/2014, 07/04/2015, 07/26/2016, 07/26/2018  . Pneumococcal  Conjugate-13 04/27/2017  . Pneumococcal Polysaccharide-23 09/23/2012, 10/28/2018  . Td 09/23/2012  . Tdap 06/13/2012  . Zoster 08/16/2012    Qualifies for Shingles Vaccine?Discussed and patient will check with pharmacy for coverage.  Patient education handout provided   Screening Tests Health Maintenance  Topic Date Due  . Hepatitis C Screening  06/22/2056 (Originally 09/25/1950)  . MAMMOGRAM  03/24/2020  . TETANUS/TDAP  09/23/2022  . COLONOSCOPY  10/19/2023  . INFLUENZA VACCINE  Completed  . DEXA SCAN  Completed  . PNA vac Low Risk Adult  Completed    Cancer Screenings: Lung: Low Dose CT Chest recommended if Age 79-80 years, 30 pack-year currently smoking OR have quit w/in 15years. Patient does not qualify. Breast:  Up to date on Mammogram? Yes   Up to date of Bone Density/Dexa? Yes Colorectal: colonoscopy 10/18/13     Plan:  I have personally reviewed and addressed the Medicare Annual Wellness questionnaire and have noted the following in the patient's chart:  A. Medical and social history B. Use of alcohol, tobacco or illicit drugs  C. Current medications and supplements D. Functional ability and status E.  Nutritional status F.  Physical activity G. Advance directives H. List of other physicians I.  Hospitalizations, surgeries, and ER visits in previous 12 months J.  Vitals K. Screenings such as hearing and vision if needed, cognitive and depression L. Referrals, records requested, and appointments- none   In addition, I have reviewed and discussed with patient certain preventive protocols, quality metrics, and best practice recommendations. A written personalized care plan for preventive services as well as general preventive health recommendations were provided to patient.   Signed,  Kandis Fantasiaourtney Aragon Scarantino, LPN  Nurse Health Advisor   Nurse Notes: no additional

## 2019-08-04 NOTE — Patient Instructions (Signed)
Danielle Rocha , Thank you for taking time to come for your Medicare Wellness Visit. I appreciate your ongoing commitment to your health goals. Please review the following plan we discussed and let me know if I can assist you in the future.   Screening recommendations/referrals: Colorectal Screening: up to date; last colonoscopy 10/18/13 Mammogram: up to date; last 03/24/18 Bone Density: up to date; last 10/31/15  Vision and Dental Exams: Recommended annual ophthalmology exams for early detection of glaucoma and other disorders of the eye Recommended annual dental exams for proper oral hygiene  Vaccinations: Influenza vaccine: completed 05/27/19 Pneumococcal vaccine: up to date; last 10/28/18 Tdap vaccine: up to date; last 09/23/12  Shingles vaccine: Please call your insurance company to determine your out of pocket expense for the Shingrix vaccine. You may receive this vaccine at your local pharmacy.  Advanced directives: Please bring a copy of your POA (Power of Attorney) and/or Living Will to your next appointment.  Goals: Recommend to drink at least 6-8 8oz glasses of water per day and consume a balanced diet rich in fresh fruits and vegetables.   Next appointment: Please schedule your Annual Wellness Visit with your Nurse Health Advisor in one year.  Preventive Care 1 Years and Older, Female Preventive care refers to lifestyle choices and visits with your health care provider that can promote health and wellness. What does preventive care include?  A yearly physical exam. This is also called an annual well check.  Dental exams once or twice a year.  Routine eye exams. Ask your health care provider how often you should have your eyes checked.  Personal lifestyle choices, including:  Daily care of your teeth and gums.  Regular physical activity.  Eating a healthy diet.  Avoiding tobacco and drug use.  Limiting alcohol use.  Practicing safe sex.  Taking low-dose aspirin every day  if recommended by your health care provider.  Taking vitamin and mineral supplements as recommended by your health care provider. What happens during an annual well check? The services and screenings done by your health care provider during your annual well check will depend on your age, overall health, lifestyle risk factors, and family history of disease. Counseling  Your health care provider may ask you questions about your:  Alcohol use.  Tobacco use.  Drug use.  Emotional well-being.  Home and relationship well-being.  Sexual activity.  Eating habits.  History of falls.  Memory and ability to understand (cognition).  Work and work Statistician.  Reproductive health. Screening  You may have the following tests or measurements:  Height, weight, and BMI.  Blood pressure.  Lipid and cholesterol levels. These may be checked every 5 years, or more frequently if you are over 32 years old.  Skin check.  Lung cancer screening. You may have this screening every year starting at age 47 if you have a 30-pack-year history of smoking and currently smoke or have quit within the past 15 years.  Fecal occult blood test (FOBT) of the stool. You may have this test every year starting at age 63.  Flexible sigmoidoscopy or colonoscopy. You may have a sigmoidoscopy every 5 years or a colonoscopy every 10 years starting at age 55.  Hepatitis C blood test.  Hepatitis B blood test.  Sexually transmitted disease (STD) testing.  Diabetes screening. This is done by checking your blood sugar (glucose) after you have not eaten for a while (fasting). You may have this done every 1-3 years.  Bone density scan.  This is done to screen for osteoporosis. You may have this done starting at age 71.  Mammogram. This may be done every 1-2 years. Talk to your health care provider about how often you should have regular mammograms. Talk with your health care provider about your test results,  treatment options, and if necessary, the need for more tests. Vaccines  Your health care provider may recommend certain vaccines, such as:  Influenza vaccine. This is recommended every year.  Tetanus, diphtheria, and acellular pertussis (Tdap, Td) vaccine. You may need a Td booster every 10 years.  Zoster vaccine. You may need this after age 11.  Pneumococcal 13-valent conjugate (PCV13) vaccine. One dose is recommended after age 28.  Pneumococcal polysaccharide (PPSV23) vaccine. One dose is recommended after age 72. Talk to your health care provider about which screenings and vaccines you need and how often you need them. This information is not intended to replace advice given to you by your health care provider. Make sure you discuss any questions you have with your health care provider. Document Released: 10/05/2015 Document Revised: 05/28/2016 Document Reviewed: 07/10/2015 Elsevier Interactive Patient Education  2017 [place] Prevention in the Home Falls can cause injuries. They can happen to people of all ages. There are many things you can do to make your home safe and to help prevent falls. What can I do on the outside of my home?  Regularly fix the edges of walkways and driveways and fix any cracks.  Remove anything that might make you trip as you walk through a door, such as a raised step or threshold.  Trim any bushes or trees on the path to your home.  Use bright outdoor lighting.  Clear any walking paths of anything that might make someone trip, such as rocks or tools.  Regularly check to see if handrails are loose or broken. Make sure that both sides of any steps have handrails.  Any raised decks and porches should have guardrails on the edges.  Have any leaves, snow, or ice cleared regularly.  Use sand or salt on walking paths during winter.  Clean up any spills in your garage right away. This includes oil or grease spills. What can I do in the  bathroom?  Use night lights.  Install grab bars by the toilet and in the tub and shower. Do not use towel bars as grab bars.  Use non-skid mats or decals in the tub or shower.  If you need to sit down in the shower, use a plastic, non-slip stool.  Keep the floor dry. Clean up any water that spills on the floor as soon as it happens.  Remove soap buildup in the tub or shower regularly.  Attach bath mats securely with double-sided non-slip rug tape.  Do not have throw rugs and other things on the floor that can make you trip. What can I do in the bedroom?  Use night lights.  Make sure that you have a light by your bed that is easy to reach.  Do not use any sheets or blankets that are too big for your bed. They should not hang down onto the floor.  Have a firm chair that has side arms. You can use this for support while you get dressed.  Do not have throw rugs and other things on the floor that can make you trip. What can I do in the kitchen?  Clean up any spills right away.  Avoid walking on wet floors.  Keep items that you use a lot in easy-to-reach places.  If you need to reach something above you, use a strong step stool that has a grab bar.  Keep electrical cords out of the way.  Do not use floor polish or wax that makes floors slippery. If you must use wax, use non-skid floor wax.  Do not have throw rugs and other things on the floor that can make you trip. What can I do with my stairs?  Do not leave any items on the stairs.  Make sure that there are handrails on both sides of the stairs and use them. Fix handrails that are broken or loose. Make sure that handrails are as long as the stairways.  Check any carpeting to make sure that it is firmly attached to the stairs. Fix any carpet that is loose or worn.  Avoid having throw rugs at the top or bottom of the stairs. If you do have throw rugs, attach them to the floor with carpet tape.  Make sure that you have a  light switch at the top of the stairs and the bottom of the stairs. If you do not have them, ask someone to add them for you. What else can I do to help prevent falls?  Wear shoes that:  Do not have high heels.  Have rubber bottoms.  Are comfortable and fit you well.  Are closed at the toe. Do not wear sandals.  If you use a stepladder:  Make sure that it is fully opened. Do not climb a closed stepladder.  Make sure that both sides of the stepladder are locked into place.  Ask someone to hold it for you, if possible.  Clearly mark and make sure that you can see:  Any grab bars or handrails.  First and last steps.  Where the edge of each step is.  Use tools that help you move around (mobility aids) if they are needed. These include:  Canes.  Walkers.  Scooters.  Crutches.  Turn on the lights when you go into a dark area. Replace any light bulbs as soon as they burn out.  Set up your furniture so you have a clear path. Avoid moving your furniture around.  If any of your floors are uneven, fix them.  If there are any pets around you, be aware of where they are.  Review your medicines with your doctor. Some medicines can make you feel dizzy. This can increase your chance of falling. Ask your doctor what other things that you can do to help prevent falls. This information is not intended to replace advice given to you by your health care provider. Make sure you discuss any questions you have with your health care provider. Document Released: 07/05/2009 Document Revised: 02/14/2016 Document Reviewed: 10/13/2014 Elsevier Interactive Patient Education  2017 [employer].

## 2019-08-05 DIAGNOSIS — R69 Illness, unspecified: Secondary | ICD-10-CM | POA: Diagnosis not present

## 2019-08-08 ENCOUNTER — Other Ambulatory Visit: Payer: Self-pay

## 2019-08-08 ENCOUNTER — Telehealth: Payer: Self-pay | Admitting: Family Medicine

## 2019-08-08 ENCOUNTER — Ambulatory Visit (INDEPENDENT_AMBULATORY_CARE_PROVIDER_SITE_OTHER): Payer: Medicare HMO | Admitting: Family Medicine

## 2019-08-08 ENCOUNTER — Encounter: Payer: Self-pay | Admitting: Family Medicine

## 2019-08-08 DIAGNOSIS — Z20828 Contact with and (suspected) exposure to other viral communicable diseases: Secondary | ICD-10-CM

## 2019-08-08 DIAGNOSIS — R69 Illness, unspecified: Secondary | ICD-10-CM | POA: Diagnosis not present

## 2019-08-08 DIAGNOSIS — Z20822 Contact with and (suspected) exposure to covid-19: Secondary | ICD-10-CM

## 2019-08-08 DIAGNOSIS — Z7189 Other specified counseling: Secondary | ICD-10-CM | POA: Diagnosis not present

## 2019-08-08 NOTE — Progress Notes (Signed)
Phone [phone] Virtual visit via Video note   Subjective:  Chief complaint: Chief Complaint  Patient presents with  . virtual  . covid questions    This visit type was conducted due to national recommendations for restrictions regarding the COVID-19 Pandemic (e.g. social distancing).  This format is felt to be most appropriate for this patient at this time balancing risks to patient and risks to population by having him in for in person visit.  No physical exam was performed (except for noted visual exam or audio findings with Telehealth visits).    Our team/I connected with Rosine Door at  4:40 PM EST by a video enabled telemedicine application (doxy.me or caregility through epic) and verified that I am speaking with the correct person using two identifiers.  Location patient: [place] Location provider: [facility], office Persons participating in the virtual visit:  patient  Our team/I discussed the limitations of evaluation and management by telemedicine and the availability of in person appointments. In light of current covid-19 pandemic, patient also understands that we are trying to protect them by minimizing in office contact if at all possible.  The patient expressed consent for telemedicine visit and agreed to proceed. Patient understands insurance will be billed.   ROS- No fever, chills, cough, congestion, runny nose, shortness of breath, new fatigue, new body aches, sore throat, headache, nausea, vomiting, diarrhea, or new loss of taste or smell. No known contacts with covid 19 or someone being tested for covid 19.    Past Medical History-  Patient Active Problem List   Diagnosis Date Noted  . Major depressive disorder, recurrent severe without psychotic features (HCC) 09/02/2016    Priority: High  . History of SI/intentional drug overdose 11/22/2015    Priority: High  . Atrial fibrillation (HCC)     Priority: High  . Chronic pain syndrome     Priority: High  .  Restless legs 07/27/2018    Priority: Medium  . Recurrent UTI 10/07/2016    Priority: Medium  . Hyperlipidemia     Priority: Medium  . GERD (gastroesophageal reflux disease) 01/30/2016    Priority: Low    Medications- reviewed and updated Current Outpatient Medications  Medication Sig Dispense Refill  . acetaminophen (TYLENOL) 500 MG tablet Take 1,000 mg by mouth every 6 (six) hours as needed for mild pain, moderate pain, fever or headache.    Marland Kitchen atorvastatin (LIPITOR) 20 MG tablet Take 1 tablet (20 mg total) by mouth daily. 90 tablet 3  . buPROPion (WELLBUTRIN XL) 300 MG 24 hr tablet Take 300 mg by mouth every morning.    . cholecalciferol (VITAMIN D) 1000 units tablet Take 1,000 Units by mouth daily.    Marland Kitchen co-enzyme Q-10 30 MG capsule Take 30 mg by mouth daily.    . Cyanocobalamin (B-12 PO) Take by mouth.    Di Kindle SULFATE PO Take by mouth.    . flecainide (TAMBOCOR) 100 MG tablet Take 1 tablet (100 mg total) by mouth 2 (two) times daily. 180 tablet 3  . gabapentin (NEURONTIN) 300 MG capsule Take 900 mg by mouth at bedtime.     Marland Kitchen HYDROcodone-acetaminophen (NORCO) 5-325 MG tablet Take 0.5 tablets by mouth 2 (two) times daily as needed for moderate pain (may refill in 1 month). 30 tablet 0  . HYDROcodone-acetaminophen (NORCO) 5-325 MG tablet Take 0.5 tablets by mouth 2 (two) times daily as needed for moderate pain (may fill in 1 month). 1/2 to 1 tab bid. May refill today  30 tablet 0  . HYDROcodone-acetaminophen (NORCO/VICODIN) 5-325 MG tablet Take 0.5 tablets by mouth 2 (two) times daily as needed for moderate pain (may fill in 2 months). 1/2 to 1 tab bid 30 tablet 0  . Melatonin 3 MG TABS Take 2 tablets by mouth at bedtime.     . Multiple Vitamin (MULTIVITAMIN WITH MINERALS) TABS tablet Take 1 tablet by mouth daily.    Marland Kitchen omeprazole (PRILOSEC) 20 MG capsule Take 1 capsule (20 mg total) by mouth 2 (two) times daily before a meal. 180 capsule 3  . rivaroxaban (XARELTO) 20 MG TABS tablet  TAKE 1 TABLET BY MOUTH ONCE DAILY WITH  SUPPER 90 tablet 3  . traZODone (DESYREL) 150 MG tablet Take 300 mg by mouth at bedtime.     No current facility-administered medications for this visit.      Objective:  LMP  (LMP Unknown)  self reported vitals Gen: NAD, resting comfortably Lungs: nonlabored, normal respiratory rate  Skin: appears dry, no obvious rash     Assessment and Plan   Covid Testing Questions S:pt is calling regarding covid questions regarding her sons exposure and she and her husband's potential risks.  On Wednesday an employee came in after her son, [name], left.  This employee later tested positive for COVID-19.  [name], her son, worked with a Freight forwarder Thursday and Friday that had been exposed on wednesday to be COVID-19 positive patient. Manager is currently being tested with results not yet known. [name] was wearing mask and manager was wearing mask.     Her son [name] has developed a runny nose in the last day or 2 but everyone else is asymptomatic.  Chyrl has been around her son this entire time.  Legend's husband Chris-his first exposure was on Friday.  Her husband goes back to work tonight  A/P: Based on exposure alone I told patient that she did not need to be tested.  I do think it is reasonable to have her son tested since he does have a runny nose and if he is positive then she and her husband should be tested.  Also discussed all of the symptoms of COVID-19 and that she should be tested if any of these develop.  We also counseled patient on potential warning signs if she does have COVID-19 for needing to seek care for her or any family members-specifically if oxygen levels get below 94% persistently with seek care immediately.  Recommended follow up: Already scheduled for February follow-up Future Appointments  Date Time Provider [place]  11/03/2019 11:00 AM Marin Olp, MD [facility]    Lab/Order associations:   ICD-10-CM   1.  Educated about COVID-19 virus infection  Z71.89     Time Stamp The duration of face-to-face time during this visit was greater than 15 minutes. Greater than 50% of this time was spent in counseling, explanation of diagnosis, planning of further management, and/or coordination of care including discussion of potential risk of COVID-19 given she and her son situation-I think the overall risk is rather low but also discussing importance of testing giving sinus symptomatic, discussing incubation periods and how this can affect results, discussing quarantine until negative test result back.    Return precautions advised.  Garret Reddish, MD

## 2019-08-08 NOTE — Telephone Encounter (Signed)
Do you want visit or ok to order?

## 2019-08-08 NOTE — Telephone Encounter (Signed)
Called virtual made.

## 2019-08-08 NOTE — Patient Instructions (Signed)
There are no preventive care reminders to display for this patient. Depression screen [facility] 2/9 08/04/2019 08/03/2019 05/04/2019  Decreased Interest 0 0 2  Down, Depressed, Hopeless 3 3 2   PHQ - 2 Score 3 3 4   Altered sleeping 3 0 0  Tired, decreased energy 3 3 0  Change in appetite 0 0 1  Feeling bad or failure about yourself  0 0 1  Trouble concentrating 3 3 2   Moving slowly or fidgety/restless 0 0 0  Suicidal thoughts 0 0 1  PHQ-9 Score 12 9 9   Difficult doing work/chores Very difficult Very difficult Somewhat difficult

## 2019-08-08 NOTE — Telephone Encounter (Signed)
[facility] [place] TELEPHONE ADVICE RECORD [facility] Patient Name: Danielle Rocha Gender: Female DOB: 05-10-1951 Age: 68 Y 3 M 12 D Return Phone Number: [phone] (Primary) Address: City/State/Zip: [place] [address] Client [facility] - Human resources officer [facility] Night Physician Tana Conch- MD Contact Type Call Who Is Calling Patient / Member / Family / Caregiver Call Type Triage / Clinical Caller Name Joanne Salah Relationship To Patient Self Return Phone Number [phone] (Primary) Chief Complaint Infection Exposure (non-symptomatic) Reason for Call Symptomatic / Request for Health Information Initial Comment Chart 1 of 3: Caller states calling for herself, husband, and son. Son was possibly exposed to someone with virus, cannot return to work until he tested; now caller and husband need to be tested as well. No symptoms at this time per caller. Translation No Nurse Assessment Nurse: Tillman Abide, RN, Debra Date/Time Lamount Cohen Time): 08/07/2019 5:16:04 PM Confirm and document reason for call. If symptomatic, describe symptoms. ---caller states son poss exposed to covid. need testing to go back to work. no symptoms. last exposure wednesday. Has the patient had close contact with a person known or suspected to have the novel coronavirus illness OR traveled / lives in area with major community spread (including international travel) in the last 14 days from the onset of symptoms? * If Asymptomatic, screen for exposure and travel within the last 14 days. ---Yes Does the patient have any new or worsening symptoms? ---Yes Will a triage be completed? ---Yes Related visit to physician within the last 2 weeks? ---No Does the PT have any chronic conditions? (i.e. diabetes, asthma, this includes High risk factors for pregnancy, etc.) ---Yes List chronic conditions. ---depression  chronic pain gerd afib Is this a behavioral health or substance abuse call? ---No Guidelines Guideline Title Affirmed Question Affirmed Notes Nurse Date/Time (Eastern Time) Coronavirus (COVID-19) - Exposure [1] CLOSE CONTACT COVID-19 EXPOSURE within last 14 days AND Tillman Abide, RN, Stanton Kidney 08/07/2019 5:33:23 PM PLEASE NOTE: All timestamps contained within this report are represented as [place] Standard Time. CONFIDENTIALTY NOTICE: This fax transmission is intended only for the addressee. It contains information that is legally privileged, confidential or otherwise protected from use or disclosure. If you are not the intended recipient, you are strictly prohibited from reviewing, disclosing, copying using or disseminating any of this information or taking any action in reliance on or regarding this information. If you have received this fax in error, please notify us immediately by telephone so that we can arrange for its return to [place]. Phone: [phone], Toll-Free: [phone], Fax: [phone] Page: 2 of 2 Call Id: [id number] Guidelines Guideline Title Affirmed Question Affirmed Notes Nurse Date/Time Lamount Cohen Time) [2] needs COVID-19 lab test to return to work AND [3] NO symptoms Disp. Time Lamount Cohen Time) Disposition Final User 08/07/2019 5:13:19 PM Send To RN Personal Tillman Abide, RN, Debra 08/07/2019 5:35:09 PM Call PCP within 24 Hours Yes Tillman Abide, RN, Karie Mainland Disagree/Comply Comply Caller Understands Yes PreDisposition Call Doctor Care Advice Given Per Guideline CALL PCP WITHIN 24 HOURS: * You need to discuss this with your doctor (or NP/PA) within the next 24 hours. * IF OFFICE WILL BE OPEN: Call the office when it opens tomorrow morning. ALTERNATE DISPOSITION - CALL TELEMEDICINE PROVIDER: * Telemedicine may be your best choice for care during this COVID-19 outbreak. * You can talk to a telemedicine provider, if your own healthcare provider is not available. NOTE TO TRIAGER -  COVID-19 TESTING  AFTER CLOSE CONTACT EXPOSURE: * A person who had a close contact COVID19 exposure and is asymptomatic should GET A COVID19 VIRAL TEST. * WHEN: 6 to 8 days after exposure. Testing right away after exposure isn't helpful because it may be too early in the incubation period and there isn't enough viral material for the test to detect. REASSURANCE AND EDUCATION - COVID-19 EXPOSURE WITH NO SYMPTOMS: * GET TESTED: A person who had a CLOSE CONTACT COVID-19 exposure and is asymptomatic should GET A COVID-19 VIRAL TEST about 6 to 8 days after exposure. Testing requires a healthcare provider's (HCP; doctor, NP, PA) order, like all medical tests. If you do get tested, you should stay home until you get your test results and follow the advice of your health care provider or a public health professional. If you test negative for COVID-19, it probably means you did not have COVID-19 at the time of testing, or that your sample was collected too early in your infection. * STAY AT HOME: Stay at home (quarantine). Do not go to work until 14 days after the exposure. * MONITOR YOUR SYMPTOMS: Check your temperature two times a day until 14 days after the exposure. Watch for symptoms of COVID-19. WATCH FOR SYMPTOMS OF COUGH AND FEVER: * Watch for symptoms of cough and fever. * Measure your temperature 2 times each day, until 14 days after exposure. * Report any fever or other concerning symptoms to your healthcare provider. COVID-19 - SYMPTOMS: * COVID-19 most often causes a respiratory illness. * The most common symptoms are: cough, fever, and shortness of breath. * Other less common symptoms are: chills, fatigue, headache, loss of smell or taste, muscle pain, and sore throat. * Some people may have minimal symptoms or even have no symptoms (asymptomatic). COVID-19 - WHERE TO GO FOR TESTING? * Go to the testing site recommended by your healthcare provider (e.g., doctor, NP, or PA) or public health  department. COVID-19 - SHOULD YOU GO TO WORK AFTER EXPOSURE? * If a person has had CLOSE CONTACT exposure to COVID-19 in the last 14 days, it is recommended that they make plans to work from home until 14 days have passed. HOME ISOLATION NEEDED IF SYMPTOMS OCCUR: * Isolate yourself at home. * Do NOT allow any visitors. * Do NOT go to work or school. * Do NOT go to religious services, child care centers, shopping, or other public places. CALL BACK (OR CALL YOUR HEALTHCARE PROVIDER) IF: * Fever or feeling feverish occurs within 14 days of COVID-19 exposure. * Cough or difficulty breathing occur within 14 days of COVID-19 exposure. * Other symptoms of COVID-19 infection occur. * You have more questions. CARE ADVICE given per Coronavirus (COVID-19) - Exposure (Adult) guideline.  PER TEAMHE[facility]

## 2019-08-08 NOTE — Telephone Encounter (Signed)
Test all 3 patients- orders can be placed. Please offer video visit to discuss potential risk, warning signs, likelihood of disease, etc. self quarantine until negative results back at a minimum but potentially longer depending on level of exposure

## 2019-08-09 ENCOUNTER — Other Ambulatory Visit: Payer: Self-pay

## 2019-08-09 DIAGNOSIS — Z20822 Contact with and (suspected) exposure to covid-19: Secondary | ICD-10-CM

## 2019-08-10 LAB — NOVEL CORONAVIRUS, NAA: SARS-CoV-2, NAA: NOT DETECTED

## 2019-09-09 DIAGNOSIS — R69 Illness, unspecified: Secondary | ICD-10-CM | POA: Diagnosis not present

## 2019-09-27 DIAGNOSIS — R69 Illness, unspecified: Secondary | ICD-10-CM | POA: Diagnosis not present

## 2019-10-28 ENCOUNTER — Encounter: Payer: Self-pay | Admitting: Family Medicine

## 2019-10-28 ENCOUNTER — Ambulatory Visit (INDEPENDENT_AMBULATORY_CARE_PROVIDER_SITE_OTHER): Payer: Medicare HMO | Admitting: Family Medicine

## 2019-10-28 ENCOUNTER — Ambulatory Visit: Payer: Medicare HMO | Attending: Internal Medicine

## 2019-10-28 VITALS — BP 117/68 | HR 72 | Ht 67.5 in | Wt 158.8 lb

## 2019-10-28 DIAGNOSIS — Z20822 Contact with and (suspected) exposure to covid-19: Secondary | ICD-10-CM

## 2019-10-28 DIAGNOSIS — R531 Weakness: Secondary | ICD-10-CM | POA: Diagnosis not present

## 2019-10-28 DIAGNOSIS — R42 Dizziness and giddiness: Secondary | ICD-10-CM | POA: Diagnosis not present

## 2019-10-28 DIAGNOSIS — R110 Nausea: Secondary | ICD-10-CM

## 2019-10-28 DIAGNOSIS — G2581 Restless legs syndrome: Secondary | ICD-10-CM | POA: Diagnosis not present

## 2019-10-28 DIAGNOSIS — E785 Hyperlipidemia, unspecified: Secondary | ICD-10-CM | POA: Diagnosis not present

## 2019-10-28 DIAGNOSIS — Z0000 Encounter for general adult medical examination without abnormal findings: Secondary | ICD-10-CM

## 2019-10-28 MED ORDER — ONDANSETRON 4 MG PO TBDP: 4.0000 mg | ORAL_TABLET | Freq: Three times a day (TID) | ORAL | 2 refills | Status: DC | PRN

## 2019-10-28 NOTE — Progress Notes (Addendum)
Phone [phone] Virtual visit via Video note   Subjective:  Chief complaint: Chief Complaint  Patient presents with  . virtual  . nausea/dizziness   This visit type was conducted due to national recommendations for restrictions regarding the COVID-19 Pandemic (e.g. social distancing).  This format is felt to be most appropriate for this patient at this time balancing risks to patient and risks to population by having him in for in person visit.  No physical exam was performed (except for noted visual exam or audio findings with Telehealth visits).    Our team/I connected with Rosine Door at  9:20 AM EST by a video enabled telemedicine application (doxy.me or caregility through epic) and verified that I am speaking with the correct person using two identifiers.  Location patient: Home-11 Location provider: [facility], office Persons participating in the virtual visit:  patient  Our team/I discussed the limitations of evaluation and management by telemedicine and the availability of in person appointments. In light of current covid-19 pandemic, patient also understands that we are trying to protect them by minimizing in office contact if at all possible.  The patient expressed consent for telemedicine visit and agreed to proceed. Patient understands insurance will be billed.   Past Medical History-  Patient Active Problem List   Diagnosis Date Noted  . Major depressive disorder, recurrent severe without psychotic features (HCC) 09/02/2016    Priority: High  . History of SI/intentional drug overdose 11/22/2015    Priority: High  . Atrial fibrillation (HCC)     Priority: High  . Chronic pain syndrome     Priority: High  . Restless legs 07/27/2018    Priority: Medium  . Recurrent UTI 10/07/2016    Priority: Medium  . Hyperlipidemia     Priority: Medium  . GERD (gastroesophageal reflux disease) 01/30/2016    Priority: Low    Medications- reviewed and updated Current  Outpatient Medications  Medication Sig Dispense Refill  . acetaminophen (TYLENOL) 500 MG tablet Take 1,000 mg by mouth every 6 (six) hours as needed for mild pain, moderate pain, fever or headache.    Marland Kitchen atorvastatin (LIPITOR) 20 MG tablet Take 1 tablet (20 mg total) by mouth daily. 90 tablet 3  . buPROPion (WELLBUTRIN XL) 300 MG 24 hr tablet Take 300 mg by mouth every morning.    . cholecalciferol (VITAMIN D) 1000 units tablet Take 1,000 Units by mouth daily.    Marland Kitchen co-enzyme Q-10 30 MG capsule Take 30 mg by mouth daily.    . Cyanocobalamin (B-12 PO) Take by mouth.    Di Kindle SULFATE PO Take by mouth.    . flecainide (TAMBOCOR) 100 MG tablet Take 1 tablet (100 mg total) by mouth 2 (two) times daily. 180 tablet 3  . gabapentin (NEURONTIN) 300 MG capsule Take 900 mg by mouth at bedtime.     Marland Kitchen HYDROcodone-acetaminophen (NORCO) 5-325 MG tablet Take 0.5 tablets by mouth 2 (two) times daily as needed for moderate pain (may refill in 1 month). 30 tablet 0  . Melatonin 3 MG TABS Take 2 tablets by mouth at bedtime.     . Multiple Vitamin (MULTIVITAMIN WITH MINERALS) TABS tablet Take 1 tablet by mouth daily.    Marland Kitchen omeprazole (PRILOSEC) 20 MG capsule Take 1 capsule (20 mg total) by mouth 2 (two) times daily before a meal. 180 capsule 3  . rivaroxaban (XARELTO) 20 MG TABS tablet TAKE 1 TABLET BY MOUTH ONCE DAILY WITH  SUPPER 90 tablet 3  .  traZODone (DESYREL) 150 MG tablet Take 300 mg by mouth at bedtime.    . ondansetron (ZOFRAN ODT) 4 MG disintegrating tablet Take 1 tablet (4 mg total) by mouth every 8 (eight) hours as needed for nausea or vomiting. 30 tablet 2   No current facility-administered medications for this visit.     Objective:  BP 117/68   Pulse 72   Ht 5' 7.5" (1.715 m)   Wt 158 lb 12.8 oz (72 kg)   LMP  (LMP Unknown)   BMI 24.50 kg/m  self reported vitals Gen: NAD, appears fatigued Lungs: nonlabored, normal respiratory rate  Skin: appears dry, no obvious rash     Assessment and  Plan    Nausea/dizziness/weakness S:Pt c/o nausea, dizziness and weakness that has been going on for about a month. Nausea causes appetite, she is able to eat but in small amounts at a time. Gets hungry but smell/eating bothers her.  Bowel Movements are normal- noconstipation or diarrhea. Nausea not associated with exertion- constant through the day.   She does c/o more of light headedness than dizziness when she stands to wakl from being seated or laying down. She tried to check blood pressure sitting and standing and they wer same  Only medication change was several months ago in September with slow wean from lexapro to wellbutrin.   She has lost weight- she thinks 10 lbs on home scales within last month- from last 2 readings here looks like 4 lbs.   She is trying to keep up her fluid intake- feels like its similar to prior.   She is still taking omeprazole twice a day before meals. No chest pain. No palpitations in regards to a fib. Still on keflex for UTIs   Normal colonoscopy 6 years. Has to chew food well or too nauseous to swallow- no pain with swallowing.  A/P: Patient with a month of nausea as well as dizziness/lightheadedness particularly with standing and generalized weakness.  Unclear etiology-not obviously related to atrial fibrillation without palpitations or increased heart rate.  Not exertional so do not strongly suspect CAD.  She is already on PPI for reflux.   -We will treat nausea symptomatically with Zofran -Encouraged her to increase fluid intake as possibly this is orthostatic intolerance/hypotension -Consider EKG at physical next week -We will go ahead and get prephysical blood work -Before physical blood work recommended COVID-19 testing just due to possibility of overlap with Covid with nausea and weakness -Asked her to follow-up if she has new or worsening symptoms -Could consider GI referral if work-up unremarkable  Recommended follow up: Scheduled for physical  next week Future Appointments  Date Time Provider Adrian  11/03/2019 11:00 AM Marin Olp, MD [facility]   Lab/Order associations:   ICD-10-CM   1. Nausea  R11.0   2. Weakness  R53.1 POCT Urinalysis Dipstick (Automated)  3. Lightheadedness  R42   4. Preventative health care  Z00.00   5. Hyperlipidemia, unspecified hyperlipidemia type  E78.5 CBC with Differential/Platelet    Comprehensive metabolic panel    Lipid panel    TSH  6. Restless legs  G25.81 IBC + Ferritin    Meds ordered this encounter  Medications  . ondansetron (ZOFRAN ODT) 4 MG disintegrating tablet    Sig: Take 1 tablet (4 mg total) by mouth every 8 (eight) hours as needed for nausea or vomiting.    Dispense:  30 tablet    Refill:  2    Return precautions advised.  Garret Reddish, MD

## 2019-10-28 NOTE — Patient Instructions (Signed)
There are no preventive care reminders to display for this patient. Depression screen [facility] 2/9 08/08/2019 08/04/2019 08/03/2019  Decreased Interest 0 0 0  Down, Depressed, Hopeless 0 3 3  PHQ - 2 Score 0 3 3  Altered sleeping 0 3 0  Tired, decreased energy 0 3 3  Change in appetite 0 0 0  Feeling bad or failure about yourself  0 0 0  Trouble concentrating 0 3 3  Moving slowly or fidgety/restless 0 0 0  Suicidal thoughts 0 0 0  PHQ-9 Score 0 12 9  Difficult doing work/chores Not difficult at all Very difficult Very difficult  Some recent data might be hidden

## 2019-10-29 LAB — NOVEL CORONAVIRUS, NAA: SARS-CoV-2, NAA: NOT DETECTED

## 2019-11-01 ENCOUNTER — Other Ambulatory Visit: Payer: Self-pay

## 2019-11-01 ENCOUNTER — Other Ambulatory Visit (INDEPENDENT_AMBULATORY_CARE_PROVIDER_SITE_OTHER): Payer: Medicare HMO

## 2019-11-01 DIAGNOSIS — R531 Weakness: Secondary | ICD-10-CM

## 2019-11-01 DIAGNOSIS — E785 Hyperlipidemia, unspecified: Secondary | ICD-10-CM | POA: Diagnosis not present

## 2019-11-01 DIAGNOSIS — G2581 Restless legs syndrome: Secondary | ICD-10-CM | POA: Diagnosis not present

## 2019-11-01 DIAGNOSIS — R3915 Urgency of urination: Secondary | ICD-10-CM

## 2019-11-01 LAB — LIPID PANEL
Cholesterol: 197 mg/dL (ref 0–200)
HDL: 60 mg/dL (ref >39.00)
NonHDL: 137.49
Total CHOL/HDL Ratio: 3
Triglycerides: 243 mg/dL — ABNORMAL HIGH (ref 0.0–149.0)
VLDL: 48.6 mg/dL — ABNORMAL HIGH (ref 0.0–40.0)

## 2019-11-01 LAB — COMPREHENSIVE METABOLIC PANEL
ALT: 17 U/L (ref 0–35)
AST: 14 U/L (ref 0–37)
Albumin: 4.1 g/dL (ref 3.5–5.2)
Alkaline Phosphatase: 57 U/L (ref 39–117)
BUN: 9 mg/dL (ref 6–23)
CO2: 27 mEq/L (ref 19–32)
Calcium: 9.7 mg/dL (ref 8.4–10.5)
Chloride: 103 mEq/L (ref 96–112)
Creatinine, Ser: 0.98 mg/dL (ref 0.40–1.20)
GFR: 56.52 mL/min — ABNORMAL LOW (ref >60.00)
Glucose, Bld: 94 mg/dL (ref 70–99)
Potassium: 4 mEq/L (ref 3.5–5.1)
Sodium: 141 mEq/L (ref 135–145)
Total Bilirubin: 0.5 mg/dL (ref 0.2–1.2)
Total Protein: 6.4 g/dL (ref 6.0–8.3)

## 2019-11-01 LAB — CBC WITH DIFFERENTIAL/PLATELET
Basophils Absolute: 0 10*3/uL (ref 0.0–0.1)
Basophils Relative: 0.4 % (ref 0.0–3.0)
Eosinophils Absolute: 0.1 10*3/uL (ref 0.0–0.7)
Eosinophils Relative: 1.1 % (ref 0.0–5.0)
HCT: 45.2 % (ref 36.0–46.0)
Hemoglobin: 15.3 g/dL — ABNORMAL HIGH (ref 12.0–15.0)
Lymphocytes Relative: 22.2 % (ref 12.0–46.0)
Lymphs Abs: 1.8 10*3/uL (ref 0.7–4.0)
MCHC: 34 g/dL (ref 30.0–36.0)
MCV: 87 fl (ref 78.0–100.0)
Monocytes Absolute: 0.8 10*3/uL (ref 0.1–1.0)
Monocytes Relative: 9.2 % (ref 3.0–12.0)
Neutro Abs: 5.5 10*3/uL (ref 1.4–7.7)
Neutrophils Relative %: 67.1 % (ref 43.0–77.0)
Platelets: 226 10*3/uL (ref 150.0–400.0)
RBC: 5.19 Mil/uL — ABNORMAL HIGH (ref 3.87–5.11)
RDW: 13.2 % (ref 11.5–15.5)
WBC: 8.2 10*3/uL (ref 4.0–10.5)

## 2019-11-01 LAB — POC URINALSYSI DIPSTICK (AUTOMATED)
Bilirubin, UA: POSITIVE
Blood, UA: NEGATIVE
Glucose, UA: NEGATIVE
Ketones, UA: POSITIVE
Leukocytes, UA: NEGATIVE
Nitrite, UA: NEGATIVE
Protein, UA: POSITIVE — AB
Spec Grav, UA: >=1.03 — AB (ref 1.010–1.025)
Urobilinogen, UA: 1 E.U./dL
pH, UA: 5 (ref 5.0–8.0)

## 2019-11-01 LAB — IBC + FERRITIN
Ferritin: 43.3 ng/mL (ref 10.0–291.0)
Iron: 246 ug/dL — ABNORMAL HIGH (ref 42–145)
Saturation Ratios: 101.6 % — ABNORMAL HIGH (ref 20.0–50.0)
Transferrin: 173 mg/dL — ABNORMAL LOW (ref 212.0–360.0)

## 2019-11-01 LAB — TSH: TSH: 0.54 u[IU]/mL (ref 0.35–4.50)

## 2019-11-01 LAB — LDL CHOLESTEROL, DIRECT: Direct LDL: 104 mg/dL

## 2019-11-01 NOTE — Addendum Note (Signed)
Addended by: Lieutenant Diego A on: 11/01/2019 03:47 PM   Modules accepted: Orders

## 2019-11-01 NOTE — Progress Notes (Signed)
I do not see an obvious cause of your feelings of weakness other than you do appear mildly dehydrated on your urine test-please increase hydration.  We can recheck urine when we see you in person  Ferritin level/iron stores still slightly low with goal over 75% with restless legs in years at 44  Your blood remains slightly thick-once again lets make sure to remain well-hydrated-consider repeat at follow-up  Your cholesterol is mildly elevated-instead of increasing medicine lets focus on healthy eating/regular exercise as able

## 2019-11-02 LAB — URINE CULTURE
MICRO NUMBER:: 10133243
SPECIMEN QUALITY:: ADEQUATE

## 2019-11-03 ENCOUNTER — Other Ambulatory Visit: Payer: Self-pay

## 2019-11-03 ENCOUNTER — Ambulatory Visit (INDEPENDENT_AMBULATORY_CARE_PROVIDER_SITE_OTHER): Payer: Medicare HMO | Admitting: Family Medicine

## 2019-11-03 ENCOUNTER — Encounter: Payer: Self-pay | Admitting: Family Medicine

## 2019-11-03 VITALS — BP 100/60 | HR 73 | Temp 98.4°F | Ht 67.5 in | Wt 164.4 lb

## 2019-11-03 DIAGNOSIS — I480 Paroxysmal atrial fibrillation: Secondary | ICD-10-CM

## 2019-11-03 DIAGNOSIS — R1084 Generalized abdominal pain: Secondary | ICD-10-CM | POA: Diagnosis not present

## 2019-11-03 DIAGNOSIS — F3342 Major depressive disorder, recurrent, in full remission: Secondary | ICD-10-CM

## 2019-11-03 DIAGNOSIS — R634 Abnormal weight loss: Secondary | ICD-10-CM

## 2019-11-03 DIAGNOSIS — Z0000 Encounter for general adult medical examination without abnormal findings: Secondary | ICD-10-CM | POA: Diagnosis not present

## 2019-11-03 DIAGNOSIS — Z79899 Other long term (current) drug therapy: Secondary | ICD-10-CM

## 2019-11-03 DIAGNOSIS — R69 Illness, unspecified: Secondary | ICD-10-CM | POA: Diagnosis not present

## 2019-11-03 MED ORDER — HYDROCODONE-ACETAMINOPHEN 5-325 MG PO TABS: 0.5000 | ORAL_TABLET | Freq: Two times a day (BID) | ORAL | 0 refills | Status: DC

## 2019-11-03 MED ORDER — HYDROCODONE-ACETAMINOPHEN 5-325 MG PO TABS: 0.5000 | ORAL_TABLET | Freq: Two times a day (BID) | ORAL | 0 refills | Status: DC | PRN

## 2019-11-03 NOTE — Patient Instructions (Addendum)
Go ahead and call breast center and schedule your next mammogram- last on file 03/2018 and I prefer these yearly though current guidelines do allow every 2 years.  Team will get you set up with stat CT- sit tight. Team also give her contrast material    Please stop by lab before you go- just for urine drug screen- forgot to add that last time If you do not have mychart- we will call you about results within 5 business days of [place] receiving them.  If you have mychart- we will send your results within 3 business days of [place] receiving them.  If abnormal or we want to clarify a result, we will call or mychart you to make sure you receive the message.  If you have questions or concerns or don't hear within 5-7 days, please send [place] a message or call us.   Recommended follow up: 3 months or sooner if needed for abdominal concerns

## 2019-11-03 NOTE — Progress Notes (Signed)
Phone: [phone]   Subjective:  Patient presents today for their annual physical. Chief complaint-noted.   See problem oriented charting- Review of Systems  Constitutional: Negative for chills and fever.  HENT: Negative for ear pain, hearing loss and tinnitus.   Eyes: Negative for blurred vision, double vision and photophobia.  Respiratory: Negative for cough, shortness of breath and wheezing.   Cardiovascular: Negative for chest pain, palpitations and leg swelling.  Gastrointestinal: Positive for abdominal pain, constipation and nausea. Negative for heartburn and vomiting.       For about a month abdominal pain  constipation for last week  Given zofan last week helps little   Genitourinary: Negative for dysuria, frequency and urgency.  Musculoskeletal: Negative for back pain, joint pain and neck pain.  Skin: Negative for itching and rash.  Neurological: Positive for dizziness and weakness. Negative for tingling, tremors, sensory change, speech change, focal weakness, seizures, loss of consciousness and headaches.       More light headed   Endo/Heme/Allergies: Does not bruise/bleed easily.  Psychiatric/Behavioral: Negative for depression, hallucinations, memory loss and suicidal ideas. The patient does not have insomnia.    The following were reviewed and entered/updated in epic: Past Medical History:  Diagnosis Date  . Arthritis    DJD, low back, thumb  . Atrial fibrillation (HCC)    Xarelto anticoagulation. Flecainide antiarrythmic   . Chicken pox   . Chronic pain syndrome    On disability. History of bilateral hip pain, low back pain. Gabapentin 400 BID, percocet 1 tablet daily per prior provider.   . Colon polyp    awaiting records  . Depression    zoloft 100mg , remeron 30mg  per psychiatry. ambien 10mg  per psychiatry to help wtih sleep element.   . Dystonia    described as psychogenic dystonia. Pain and twisting from upper chest and up ith triggers "wind, creamy food"  on TID ativan per psychiatry previously.   . Fibromyalgia   . GERD (gastroesophageal reflux disease)    omeprazole OTC  . Goiter    states multiple imaging tests, has had biopsies  . Hyperlipidemia    lovastatin 20mg   . Stroke (HCC)    TIA (left side of face and body decreased sensitivity than right face and side)  . TIA (transient ischemic attack)   . Vaginal atrophy    estrace vaginal cream   Patient Active Problem List   Diagnosis Date Noted  . Major depression in full remission (HCC) 09/02/2016    Priority: High  . History of SI/intentional drug overdose 11/22/2015    Priority: High  . Atrial fibrillation (HCC)     Priority: High  . Chronic pain syndrome     Priority: High  . Restless legs 07/27/2018    Priority: Medium  . Recurrent UTI 10/07/2016    Priority: Medium  . Hyperlipidemia     Priority: Medium  . GERD (gastroesophageal reflux disease) 01/30/2016    Priority: Low   Past Surgical History:  Procedure Laterality Date  . ATRIAL FIBRILLATION ABLATION    . BIOPSY THYROID    . fusion l4-l5  2000  . LAMINECTOMY     L4-L5  . OTHER SURGICAL HISTORY     tennis elbow surgery  . piriformis release  1999  . TONSILLECTOMY AND ADENOIDECTOMY  age 69  . VAGINAL HYSTERECTOMY  1993    Family History  Problem Relation Age of Onset  . Alcohol abuse Mother   . Hypertension Father   . Alcohol abuse Father   .  Pancreatic cancer Father   . Cancer Brother        spindle cell RLE  . Diabetes Brother   . Cancer Brother        tonsil cancer  . Skin cancer Brother     Medications- reviewed and updated Current Outpatient Medications  Medication Sig Dispense Refill  . acetaminophen (TYLENOL) 500 MG tablet Take 1,000 mg by mouth every 6 (six) hours as needed for mild pain, moderate pain, fever or headache.    Marland Kitchen atorvastatin (LIPITOR) 20 MG tablet Take 1 tablet (20 mg total) by mouth daily. 90 tablet 3  . buPROPion (WELLBUTRIN XL) 300 MG 24 hr tablet Take 300 mg by mouth  every morning.    . cholecalciferol (VITAMIN D) 1000 units tablet Take 1,000 Units by mouth daily.    Marland Kitchen co-enzyme Q-10 30 MG capsule Take 30 mg by mouth daily.    . Cyanocobalamin (B-12 PO) Take by mouth.    Lyndle Herrlich SULFATE PO Take by mouth.    . flecainide (TAMBOCOR) 100 MG tablet Take 1 tablet (100 mg total) by mouth 2 (two) times daily. 180 tablet 3  . gabapentin (NEURONTIN) 300 MG capsule Take 900 mg by mouth at bedtime.     Marland Kitchen HYDROcodone-acetaminophen (NORCO) 5-325 MG tablet Take 0.5 tablets by mouth 2 (two) times daily as needed for moderate pain (may refill today). 30 tablet 0  . Melatonin 3 MG TABS Take 2 tablets by mouth at bedtime.     . Multiple Vitamin (MULTIVITAMIN WITH MINERALS) TABS tablet Take 1 tablet by mouth daily.    Marland Kitchen omeprazole (PRILOSEC) 20 MG capsule Take 1 capsule (20 mg total) by mouth 2 (two) times daily before a meal. 180 capsule 3  . ondansetron (ZOFRAN ODT) 4 MG disintegrating tablet Take 1 tablet (4 mg total) by mouth every 8 (eight) hours as needed for nausea or vomiting. 30 tablet 2  . rivaroxaban (XARELTO) 20 MG TABS tablet TAKE 1 TABLET BY MOUTH ONCE DAILY WITH  SUPPER 90 tablet 3  . traZODone (DESYREL) 150 MG tablet Take 300 mg by mouth at bedtime.    . cephALEXin (KEFLEX) 250 MG capsule     . HYDROcodone-acetaminophen (NORCO/VICODIN) 5-325 MG tablet Take 0.5 tablets by mouth 2 (two) times daily. May refill in 1 month 30 tablet 0  . HYDROcodone-acetaminophen (NORCO/VICODIN) 5-325 MG tablet Take 0.5 tablets by mouth 2 (two) times daily. May refill in 2 months 30 tablet 0   No current facility-administered medications for this visit.    Allergies-reviewed and updated Allergies  Allergen Reactions  . Reglan [Metoclopramide] Other (See Comments)    Suicidal thoughts  . Ciprofloxacin Diarrhea  . Clindamycin/Lincomycin Diarrhea  . Vibramycin [Doxycycline] Diarrhea  . Macrobid [Nitrofurantoin Monohyd Macro] Rash    Rash on lip  . Sulfa Antibiotics Rash     Social History   Social History Narrative   Family: Married. Husband works as Geophysicist/field seismologist for [employer]. 2 children from previous marriage 1 from current. Son [name] lives with them and has down's syndrome.       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 Moved to [facility] from [id number] and became disabled in that time frame due to hip/back issues.       Hobbies: time with son   Objective  Objective:  BP 100/60   Pulse 73   Temp 98.4 F (36.9 C) (Temporal)   Ht 5' 7.5" (1.715 m)   Wt 164 lb 6.4 oz (  74.6 kg)   LMP  (LMP Unknown)   SpO2 95%   BMI 25.37 kg/m  Gen: NAD, resting comfortably HEENT: Mucous membranes are moist. Oropharynx normal Neck: no thyromegaly CV: RRR no murmurs rubs or gallops Lungs: CTAB no crackles, wheeze, rhonchi Abdomen: soft/diffuse abdominal pain with palpation worse in periumbilical region/nondistended/normal bowel sounds. No rebound or guarding.  Ext: no edema Skin: warm, dry Neuro: grossly normal, moves all extremities, PERRLA   Assessment and Plan   69 y.o. female presenting for annual physical.  Health Maintenance counseling: 1. Anticipatory guidance: Patient counseled regarding regular dental exams q6 months have not done due to covid, eye exams yearly ,  avoiding smoking and second hand smoke , limiting alcohol to 1 beverage per day . Never    2. Risk factor reduction:  Advised patient of need for regular exercise and diet rich and fruits and vegetables to reduce risk of heart attack and stroke. Exercise- tires to walk around block as much as she can- limited by pain.. Diet-working on healthy diet but right now not able to eat.  Down 4 lbs in last few months- she felt like was even more as clothes are not as tight Wt Readings from Last 3 Encounters:  11/03/19 164 lb 6.4 oz (74.6 kg)  10/28/19 158 lb 12.8 oz (72 kg)  08/03/19 162 lb 12.8 oz (73.8 kg)  3. Immunizations/screenings/ancillary studies- defer shingrix due  to current GI issues. She is currently on the wait list for covid vaccine and son is as well.  Immunization History  Administered Date(s) Administered  . Influenza, High Dose Seasonal PF 07/29/2017, 05/27/2019  . Influenza-Unspecified 06/09/2013, 06/06/2014, 07/04/2015, 07/26/2016, 07/26/2018  . Pneumococcal Conjugate-13 04/27/2017  . Pneumococcal Polysaccharide-23 09/23/2012, 10/28/2018  . Td 09/23/2012  . Tdap 06/13/2012  . Zoster 08/16/2012  4. Cervical cancer screening- passed age based screening recommendations per USPTF 5. Breast cancer screening-  mammogram up to date 03/24/2018 6. Colon cancer screening - due 10/19/2023, last done 09/2013. No blood in stool or melena. Dark stool when takes iron twice a week 7. Skin cancer screening- no dermatologist. advised regular sunscreen use. Denies worrisome, changing, or new skin lesions.  8. Birth control/STD check-  Declined  9. Osteoporosis screening at 63- normal 10/31/15 -never  smoker  Status of chronic or acute concerns   Patient was seen 6 days ago by virtual visit.  Has had month-long issues with nausea as well as dizziness/lightheadedness particularly with standing along with generalized weakness..  Today she reports abdominal pain that is diffuse with pain up to 4/10 and constipation as well-2 in last week in a half. No blood in stool.   -Heart rate appears controlled so do not suspect atrial fibrillation as cause -Not exertional so doubt CAD -No recent medication changes -no one else sick in the family  -On PPI for reflux -Trial of Zofran was mildly helpful -Encouraged her to increase fluids in case this was orthostatic intolerance-she denies this has been helpful.  She is not on any blood pressure medications -Labs done last week and no obvious cause  -get CT abd/pelvis with unintentional weight loss and diffuse abdominal pain -GI referral if no obvious cause found    #Atrial fibrillation S:Compliant with Xarelto for  anticoagulation.  Flecainide as an antiarrhythmic.  Does not follow with cardiology regularly but they were ok with long term use A/P: appears stable- continue current meds    #Chronic pain syndrome S:On disability due to chronic pain syndrome predating being  cared for at [place]. Extensive prior orthopedic workup. issues mainly in low back and hips.  Hydrocodone 5/325 once per day-either takes full tablet once or splits and takes twice a day. On gabapentin at bedtime from psychiatry- not sure this helps with pain.  -Pain worsened with PT A/P: Still with considerable amount of pain- she is tolerating it as best as possible with low dose  - also takes tylenol some- knows to limit total tylenol to 3000 mg per day -UDS 04/26/2018- will update today - Pain contract 01/24/2018 -[facility] reviewed today    #Major depressive disorder S: currently on wellbutrin alone- D/c zyprexa recently now on Wellbutrin  working with psychiatry Dr. Donell Beers. Previously On zyprexa 20mg , gabepentin, trazodone, lexapro.   Continues counseling.  A/P: sees psychiatry on Monday. phq9 reasonably controlled. Continue current meds   #Hyperlipidemia S:compliant atorvastatin 20mg  daily. LDL goal at least under 100  At least Lab Results  Component Value Date   CHOL 197 11/01/2019   HDL 60.00 11/01/2019   LDLCALC 75 10/28/2017   LDLDIRECT 104.0 11/01/2019   TRIG 243.0 (H) 11/01/2019   CHOLHDL 3 11/01/2019  A/P: mild poor control- wants to work on lifestyle until GI issues worked up more adequately- can recheck with next bloodwork    #GERD S:compliant with omeprazole twice daily. She is on b12 and folate in regards to PPI risk of lowering these.   Lab Results  Component Value Date   VITAMINB12 905 10/28/2018  A/P: reasonable control- get b12 next bloodwork   #Restless legs S:restless legs has been better with b12 and ferrous sulfate/iron. resolved with iron repletion A/P: doing well- continue to monitor.    %Recurrent  UTI- has seen urology. Had cystoscopy. On chronic keflex for prevention.  No UTI on recent work-up   Recommended follow up: 3 months or sooner if needed for abdominal concerns  Lab/Order associations:not  fasting   ICD-10-CM   1. Preventative health care  Z00.00   2. High risk medication use  Z79.899 Pain Mgmt, Profile 8 w/Conf, U  3. Recurrent major depressive disorder, in full remission (HCC)  F33.42   4. Paroxysmal atrial fibrillation (HCC) Chronic I48.0     Meds ordered this encounter  Medications  . HYDROcodone-acetaminophen (NORCO) 5-325 MG tablet    Sig: Take 0.5 tablets by mouth 2 (two) times daily as needed for moderate pain (may refill today).    Dispense:  30 tablet    Refill:  0  . HYDROcodone-acetaminophen (NORCO/VICODIN) 5-325 MG tablet    Sig: Take 0.5 tablets by mouth 2 (two) times daily. May refill in 1 month    Dispense:  30 tablet    Refill:  0  . HYDROcodone-acetaminophen (NORCO/VICODIN) 5-325 MG tablet    Sig: Take 0.5 tablets by mouth 2 (two) times daily. May refill in 2 months    Dispense:  30 tablet    Refill:  0    Return precautions advised.  12/30/2019, MD

## 2019-11-04 ENCOUNTER — Ambulatory Visit (INDEPENDENT_AMBULATORY_CARE_PROVIDER_SITE_OTHER)
Admission: RE | Admit: 2019-11-04 | Discharge: 2019-11-04 | Disposition: A | Discharge status: Home / Self Care | Payer: Medicare HMO | Source: Ambulatory Visit | Attending: Family Medicine | Admitting: Family Medicine

## 2019-11-04 ENCOUNTER — Telehealth: Payer: Self-pay

## 2019-11-04 DIAGNOSIS — R1084 Generalized abdominal pain: Secondary | ICD-10-CM | POA: Diagnosis not present

## 2019-11-04 DIAGNOSIS — R634 Abnormal weight loss: Secondary | ICD-10-CM

## 2019-11-04 DIAGNOSIS — R109 Unspecified abdominal pain: Secondary | ICD-10-CM | POA: Diagnosis not present

## 2019-11-04 MED ORDER — IOHEXOL 300 MG/ML  SOLN
100.0000 mL | Freq: Once | INTRAMUSCULAR | Status: AC | PRN
  Administered 2019-11-04: 09:00:00 100 mL via INTRAVENOUS

## 2019-11-04 NOTE — Telephone Encounter (Signed)
Virtual Visit Pre-Appointment Phone Call  "(Name), I am calling you today to discuss your upcoming appointment. We are currently trying to limit exposure to the virus that causes COVID-19 by seeing patients at home rather than in the office."  1. "What is the BEST phone number to call the day of the visit?" - include this in appointment notes  2. "Do you have or have access to (through a family member/friend) a smartphone with video capability that we can use for your visit?" a. If yes - list this number in appt notes as "cell" (if different from BEST phone #) and list the appointment type as a VIDEO visit in appointment notes b. If no - list the appointment type as a PHONE visit in appointment notes  Confirm consent - "In the setting of the current Covid19 crisis, you are scheduled for a (phone or video) visit with your provider on (date) at (time).  Just as we do with many in-office visits, in order for you to participate in this visit, we must obtain consent.  If you'd like, I can send this to your mychart (if signed up) or email for you to review.  Otherwise, I can obtain your verbal consent now.  All virtual visits are billed to your insurance company just like a normal visit would be.  By agreeing to a virtual visit, we'd like you to understand that the technology does not allow for your provider to perform an examination, and thus may limit your provider's ability to fully assess your condition. If your provider identifies any concerns that need to be evaluated in person, we will make arrangements to do so.  Finally, though the technology is pretty good, we cannot assure that it will always work on either your or our end, and in the setting of a video visit, we may have to convert it to a phone-only visit.  In either situation, we cannot ensure that we have a secure connection.  Are you willing to proceed?" STAFF: Did the patient verbally acknowledge consent to telehealth visit? Document  YES/NO here:  3. Advise patient to be prepared - "Two hours prior to your appointment, go ahead and check your blood pressure, pulse, oxygen saturation, and your weight (if you have the equipment to check those) and write them all down. When your visit starts, your provider will ask you for this information. If you have an Apple Watch or Kardia device, please plan to have heart rate information ready on the day of your appointment. Please have a pen and paper handy nearby the day of the visit as well."  4. Give patient instructions for MyChart download to smartphone OR Doximity/Doxy.me as below if video visit (depending on what platform provider is using)  5. Inform patient they will receive a phone call 15 minutes prior to their appointment time (may be from unknown caller ID) so they should be prepared to answer    TELEPHONE CALL NOTE  Danielle Rocha has been deemed a candidate for a follow-up tele-health visit to limit community exposure during the Covid-19 pandemic. I spoke with the patient via phone to ensure availability of phone/video source, confirm preferred email & phone number, and discuss instructions and expectations.  I reminded Danielle Rocha to be prepared with any vital sign and/or heart rhythm information that could potentially be obtained via home monitoring, at the time of her visit. I reminded Danielle Rocha to expect a phone call prior to her visit.  Danielle Rocha  11/04/2019 11:25 AM   INSTRUCTIONS FOR DOWNLOADING THE MYCHART APP TO SMARTPHONE  - The patient must first make sure to have activated MyChart and know their login information - If Apple, go to [employer] and type in MyChart in the search bar and download the app. If Android, ask patient to go to [employer] and type in Simla in the search bar and download the app. The app is free but as with any other app downloads, their phone may require them to verify saved payment information or Apple/Android  password.  - The patient will need to then log into the app with their MyChart username and password, and select [facility] as their healthcare provider to link the account. When it is time for your visit, go to the MyChart app, find appointments, and click Begin Video Visit. Be sure to Select Allow for your device to access the Microphone and Camera for your visit. You will then be connected, and your provider will be with you shortly.  **If they have any issues connecting, or need assistance please contact MyChart service desk [phone])**  **If using a computer, in order to ensure the best quality for their visit they will need to use either of the following Internet Browsers: [employer], or Google Chrome**  IF USING DOXIMITY or DOXY.ME - The patient will receive a link just prior to their visit by text.     FULL LENGTH CONSENT FOR TELE-HEALTH VISIT   I hereby voluntarily request, consent and authorize [facility] and its employed or contracted physicians, physician assistants, nurse practitioners or other licensed health care professionals (the Practitioner), to provide me with telemedicine health care services (the "Services") as deemed necessary by the treating Practitioner. I acknowledge and consent to receive the Services by the Practitioner via telemedicine. I understand that the telemedicine visit will involve communicating with the Practitioner through live audiovisual communication technology and the disclosure of certain medical information by electronic transmission. I acknowledge that I have been given the opportunity to request an in-person assessment or other available alternative prior to the telemedicine visit and am voluntarily participating in the telemedicine visit.  I understand that I have the right to withhold or withdraw my consent to the use of telemedicine in the course of my care at any time, without affecting my right to future care or treatment,  and that the Practitioner or I may terminate the telemedicine visit at any time. I understand that I have the right to inspect all information obtained and/or recorded in the course of the telemedicine visit and may receive copies of available information for a reasonable fee.  I understand that some of the potential risks of receiving the Services via telemedicine include:  Marland Kitchen Delay or interruption in medical evaluation due to technological equipment failure or disruption; . Information transmitted may not be sufficient (e.g. poor resolution of images) to allow for appropriate medical decision making by the Practitioner; and/or  . In rare instances, security protocols could fail, causing a breach of personal health information.  Furthermore, I acknowledge that it is my responsibility to provide information about my medical history, conditions and care that is complete and accurate to the best of my ability. I acknowledge that Practitioner's advice, recommendations, and/or decision may be based on factors not within their control, such as incomplete or inaccurate data provided by me or distortions of diagnostic images or specimens that may result from electronic transmissions. I understand that the practice of medicine  is not an Chief Strategy Officer and that Practitioner makes no warranties or guarantees regarding treatment outcomes. I acknowledge that I will receive a copy of this consent concurrently upon execution via email to the email address I last provided but may also request a printed copy by calling the office of [facility].    I understand that my insurance will be billed for this visit.   I have read or had this consent read to me. . I understand the contents of this consent, which adequately explains the benefits and risks of the Services being provided via telemedicine.  . I have been provided ample opportunity to ask questions regarding this consent and the Services and have had my questions  answered to my satisfaction. . I give my informed consent for the services to be provided through the use of telemedicine in my medical care  By participating in this telemedicine visit I agree to the above.

## 2019-11-07 ENCOUNTER — Other Ambulatory Visit: Payer: Self-pay

## 2019-11-07 ENCOUNTER — Encounter: Payer: Self-pay | Admitting: Internal Medicine

## 2019-11-07 ENCOUNTER — Telehealth (INDEPENDENT_AMBULATORY_CARE_PROVIDER_SITE_OTHER): Payer: Medicare HMO | Admitting: Internal Medicine

## 2019-11-07 DIAGNOSIS — R69 Illness, unspecified: Secondary | ICD-10-CM | POA: Diagnosis not present

## 2019-11-07 DIAGNOSIS — R1084 Generalized abdominal pain: Secondary | ICD-10-CM

## 2019-11-07 DIAGNOSIS — E782 Mixed hyperlipidemia: Secondary | ICD-10-CM

## 2019-11-07 DIAGNOSIS — I10 Essential (primary) hypertension: Secondary | ICD-10-CM | POA: Diagnosis not present

## 2019-11-07 DIAGNOSIS — R110 Nausea: Secondary | ICD-10-CM

## 2019-11-07 NOTE — Progress Notes (Signed)
Virtual Visit via Telephone Note   This visit type was conducted due to national recommendations for restrictions regarding the COVID-19 Pandemic (e.g. social distancing) in an effort to limit this patient's exposure and mitigate transmission in our community.  Due to her co-morbid illnesses, this patient is at least at moderate risk for complications without adequate follow up.  This format is felt to be most appropriate for this patient at this time.  The patient did not have access to video technology/had technical difficulties with video requiring transitioning to audio format only (telephone).  All issues noted in this document were discussed and addressed.  No physical exam could be performed with this format.  Please refer to the patient's chart for her  consent to telehealth for [facility].   Date:  11/07/2019   ID:  Danielle Rocha, DOB [birth year], MRN [id number]  Patient Location: Home Provider Location: Office  PCP:  Shelva Majestic, MD  Cardiologist:  Dietrich Pates, MD  Evaluation Performed:  Follow-Up Visit  Chief Complaint:  Pt presents for f/u of PAF  History of Present Illness:    Danielle Rocha is a 69 y.o. female with a hx of PAF    I saw her for the first time in 2019  She had moved her from [place]   Had ablation there about 2012-2015.   I last saw her in clinic in 2019    The pt denies palpitations   No SOB   No CP  She has been nauseated for the past 5 to 6 wks   Going to see GI soon    Just had CT of abdomen  last week    The patient does not have symptoms concerning for COVID-19 infection (fever, chills, cough, or new shortness of breath).    Past Medical History:  Diagnosis Date  . Arthritis    DJD, low back, thumb  . Atrial fibrillation (HCC)    Xarelto anticoagulation. Flecainide antiarrythmic   . Chicken pox   . Chronic pain syndrome    On disability. History of bilateral hip pain, low back pain. Gabapentin 400 BID, percocet 1 tablet daily per  prior provider.   . Colon polyp    awaiting records  . Depression    zoloft 100mg , remeron 30mg  per psychiatry. ambien 10mg  per psychiatry to help wtih sleep element.   . Dystonia    described as psychogenic dystonia. Pain and twisting from upper chest and up ith triggers "wind, creamy food" on TID ativan per psychiatry previously.   . Fibromyalgia   . GERD (gastroesophageal reflux disease)    omeprazole OTC  . Goiter    states multiple imaging tests, has had biopsies  . Hyperlipidemia    lovastatin 20mg   . Stroke [facility])    TIA (left side of face and body decreased sensitivity than right face and side)  . TIA (transient ischemic attack)   . Vaginal atrophy    estrace vaginal cream   Past Surgical History:  Procedure Laterality Date  . ATRIAL FIBRILLATION ABLATION    . BIOPSY THYROID    . fusion l4-l5  2000  . LAMINECTOMY     L4-L5  . OTHER SURGICAL HISTORY     tennis elbow surgery  . piriformis release  1999  . TONSILLECTOMY AND ADENOIDECTOMY  age 50  . VAGINAL HYSTERECTOMY  1993     Current Meds  Medication Sig  . acetaminophen (TYLENOL) 500 MG tablet Take 1,000 mg by mouth every 6 (six)  hours as needed for mild pain, moderate pain, fever or headache.  Marland Kitchen atorvastatin (LIPITOR) 20 MG tablet Take 1 tablet (20 mg total) by mouth daily.  Marland Kitchen buPROPion (WELLBUTRIN XL) 300 MG 24 hr tablet Take 300 mg by mouth every morning.  . cephALEXin (KEFLEX) 250 MG capsule   . cholecalciferol (VITAMIN D) 1000 units tablet Take 1,000 Units by mouth daily.  Marland Kitchen co-enzyme Q-10 30 MG capsule Take 30 mg by mouth daily.  . Cyanocobalamin (B-12 PO) Take by mouth.  Lyndle Herrlich SULFATE PO Take by mouth.  . flecainide (TAMBOCOR) 100 MG tablet Take 1 tablet (100 mg total) by mouth 2 (two) times daily.  Marland Kitchen gabapentin (NEURONTIN) 300 MG capsule Take 900 mg by mouth at bedtime.   Marland Kitchen HYDROcodone-acetaminophen (NORCO) 5-325 MG tablet Take 0.5 tablets by mouth 2 (two) times daily as needed for moderate pain (may  refill today).  . Melatonin 3 MG TABS Take 2 tablets by mouth at bedtime.   . Multiple Vitamin (MULTIVITAMIN WITH MINERALS) TABS tablet Take 1 tablet by mouth daily.  Marland Kitchen omeprazole (PRILOSEC) 20 MG capsule Take 1 capsule (20 mg total) by mouth 2 (two) times daily before a meal.  . ondansetron (ZOFRAN ODT) 4 MG disintegrating tablet Take 1 tablet (4 mg total) by mouth every 8 (eight) hours as needed for nausea or vomiting.  . rivaroxaban (XARELTO) 20 MG TABS tablet TAKE 1 TABLET BY MOUTH ONCE DAILY WITH  SUPPER  . traZODone (DESYREL) 150 MG tablet Take 300 mg by mouth at bedtime.     Allergies:   Reglan [metoclopramide], Ciprofloxacin, Clindamycin/lincomycin, Vibramycin [doxycycline], Macrobid [nitrofurantoin monohyd macro], and Sulfa antibiotics   Social History   Tobacco Use  . Smoking status: Never Smoker  . Smokeless tobacco: Never Used  Substance Use Topics  . Alcohol use: No    Alcohol/week: 0.0 standard drinks  . Drug use: No     Family Hx: The patient's family history includes Alcohol abuse in her father and mother; Cancer in her brother and brother; Diabetes in her brother; Hypertension in her father; Pancreatic cancer in her father; Skin cancer in her brother.  ROS:   Please see the history of present illness.    All other systems reviewed and are negative.   Prior CV studies:   The following studies were reviewed today:    Labs/Other Tests and Data Reviewed:    EKG:  No ECG reviewed.  Recent Labs: 11/01/2019: ALT 17; BUN 9; Creatinine, Ser 0.98; Hemoglobin 15.3; Platelets 226.0; Potassium 4.0; Sodium 141; TSH 0.54   Recent Lipid Panel Lab Results  Component Value Date/Time   CHOL 197 11/01/2019 09:16 AM   TRIG 243.0 (H) 11/01/2019 09:16 AM   HDL 60.00 11/01/2019 09:16 AM   CHOLHDL 3 11/01/2019 09:16 AM   LDLCALC 75 10/28/2017 11:27 AM   LDLDIRECT 104.0 11/01/2019 09:16 AM    Wt Readings from Last 3 Encounters:  11/07/19 164 lb (74.4 kg)  11/03/19 164 lb  6.4 oz (74.6 kg)  10/28/19 158 lb 12.8 oz (72 kg)     Objective:    Vital Signs:  BP 103/62   Pulse 67   Ht 5' 7.25" (1.708 m)   Wt 164 lb (74.4 kg)   LMP  (LMP Unknown)   BMI 25.50 kg/m    VITAL SIGNS:  reviewed  ASSESSMENT & PLAN:    1. PAF  Pt denies palpitations   I would keep on same regimen    2  HL  Last lipids were this month   LDL 104   With hx of aortic plaquing on CT scan I would recomm pt get tighter control   LDL 104   With current N/ I would hold making changes    Once resolves could consider increasing lipitor or switching to Crestor      3.  Nausea  GI work up under way    4  COVID-19 Education: The signs and symptoms of COVID-19 were discussed with the patient and how to seek care for testing (follow up with PCP or arrange E-visit).  The importance of social distancing was discussed today.  Time:   Today, I have spent 20  minutes with the patient with telehealth technology discussing the above problems.     Medication Adjustments/Labs and Tests Ordered: Current medicines are reviewed at length with the patient today.  Concerns regarding medicines are outlined above.   Tests Ordered: No orders of the defined types were placed in this encounter.   Medication Changes: No orders of the defined types were placed in this encounter.   Follow Up:  F/U in NOvember 2021  In person    Signed, Dietrich Pates, MD  11/07/2019 1:40 PM    [facility]

## 2019-11-09 LAB — PAIN MGMT, PROFILE 8 W/CONF, U
6 Acetylmorphine: NEGATIVE ng/mL
Alcohol Metabolites: NEGATIVE ng/mL (ref <500)
Amphetamine: NEGATIVE ng/mL
Amphetamines: NEGATIVE ng/mL
Benzodiazepines: NEGATIVE ng/mL
Buprenorphine, Urine: NEGATIVE ng/mL
Buprenorphine: NEGATIVE ng/mL
Cocaine Metabolite: NEGATIVE ng/mL
Codeine: NEGATIVE ng/mL
Creatinine: 181.1 mg/dL
Hydrocodone: 1417 ng/mL
Hydromorphone: NEGATIVE ng/mL
MDMA: NEGATIVE ng/mL
Marijuana Metabolite: NEGATIVE ng/mL
Methamphetamine: NEGATIVE ng/mL
Morphine: NEGATIVE ng/mL
Norbuprenorphine: NEGATIVE ng/mL
Norhydrocodone: 1461 ng/mL
Opiates: POSITIVE ng/mL
Oxidant: NEGATIVE ug/mL
Oxycodone: NEGATIVE ng/mL
pH: 5.4 (ref 4.5–9.0)

## 2019-11-13 ENCOUNTER — Ambulatory Visit: Payer: Medicare HMO | Attending: Internal Medicine

## 2019-11-13 DIAGNOSIS — Z23 Encounter for immunization: Secondary | ICD-10-CM

## 2019-11-13 NOTE — Progress Notes (Signed)
   Covid-19 Vaccination Clinic  Name:  Tavia Stave    MRN: [id number] DOB: December 11, 1950  11/13/2019  Ms. Chenevert was observed post Covid-19 immunization for 15 minutes without incidence. She was provided with Vaccine Information Sheet and instruction to access the V-Safe system.   Ms. Carranco was instructed to call 911 with any severe reactions post vaccine: Marland Kitchen Difficulty breathing  . Swelling of your face and throat  . A fast heartbeat  . A bad rash all over your body  . Dizziness and weakness    Immunizations Administered    Name Date Dose VIS Date Route   Pfizer COVID-19 Vaccine 11/13/2019 12:41 PM 0.3 mL 09/02/2019 Intramuscular   Manufacturer: [employer], [employer]   Lot: [id number]   NDC: [address]-8

## 2019-11-20 NOTE — Progress Notes (Signed)
11/20/2019 Danielle Rocha [id number] 12/06/1950   CHIEF COMPLAINT: Nausea  HISTORY OF PRESENT ILLNESS:  Danielle Rocha is a 69 year old female with a past medical history of depression, fibromyalgia, arthritis, atrial fibrillation on Xarelto, TIA 6 years ago, restless leg syndrome, GERD x 10 years and colon polyps. Chronic bilateral hip pain. She has nausea daily which started 2 months ago. No new medications. She vomits only if she gags herself when brushing her teeth, lasts occurred yesterday. She complains of having constant generalized abdominal pain which she has difficulty prescribing.  Decreased appetite. Occasional heartburn.  No dysphagia. She takes Omeprazole 20mg  bid f or the past 6 years. She is having intermittent constipation. She is passing a normal brown BM once or twice weekly. Previously passed a normal BM daily. Infrequent diarrhea. No blood or black stools. Stools darker on days she takes iron. She reports taking Ferrous Sulfate secondary to having anemia. She is taking Ferrous Sulfate 325mg  one tab every Tuesday and Friday for the past year. In review of her labs over the past year, I do not see any evidence of IDA. Hg 14.1 - 15.3. I suspect she was prescribed iron po possible for restless leg syndrome? She takes Tylenol as needed. No NSAIDS. Weight 08/03/2019 162lbs. Weight today 164lbs. An abd/pelvic CT 11/04/2019 showed a small liver cyst and diverticulosis, otherwise was normal.  She underwent a colonoscopy in [place], [place] in 2015 which showed diverticulosis throughout the entire colon, no polyps. No family history of pancreatic cancer. Father died from pancreatic cancer.  She received her Covid 19 vaccine on 11/13/2019.   CBC Latest Ref Rng & Units 11/01/2019 10/28/2018 04/26/2018  WBC 4.0 - 10.5 K/uL 8.2 7.2 7.1  Hemoglobin 12.0 - 15.0 g/dL 15.3(H) 15.2(H) 14.1  Hematocrit 36.0 - 46.0 % 45.2 44.4 40.5  Platelets 150.0 - 400.0 K/uL 226.0 244.0 208.0   CMP Latest Ref Rng & Units  11/01/2019 10/28/2018 04/26/2018  Glucose 70 - 99 mg/dL 94 86 12/27/2018)  BUN 6 - 23 mg/dL 9 10 10   Creatinine 0.40 - 1.20 mg/dL 06/26/2018 542(H  Sodium 135 - 145 mEq/L 141 144 142  Potassium 3.5 - 5.1 mEq/L 4.0 4.2 4.0  Chloride 96 - 112 mEq/L 103 105 106  CO2 19 - 32 mEq/L 27 28 28   Calcium 8.4 - 10.5 mg/dL 9.7 9.6 9.6  Total Protein 6.0 - 8.3 g/dL 6.4 6.7 -  Total Bilirubin 0.2 - 1.2 mg/dL 0.5 0.5 -  Alkaline Phos 39 - 117 U/L 57 62 -  AST 0 - 37 U/L 14 14 -  ALT 0 - 35 U/L 17 18 -    Abdominal/pelvic CT 11/04/2019: Lower chest: No acute abnormality. Hepatobiliary: Small 1 cm cyst is noted in the medial segment of the left lobe of the liver. Liver is otherwise within normal limits. Gallbladder is within normal limits. Pancreas: Unremarkable. No pancreatic ductal dilatation or surrounding inflammatory changes. Spleen: Normal in size without focal abnormality Adrenals/Urinary Tract: Adrenal glands are within normal limits. Kidneys are well visualized bilaterally within normal enhancement pattern. No renal calculi. Small renal cysts are noted on the right. Normal excretion of contrast material is seen. The bladder is partially distended. Stomach/Bowel: Colon demonstrates scattered diverticular change. No obstructive or inflammatory changes are seen. The appendix is within normal limits. No small bowel or gastric abnormality is noted. Vascular/Lymphatic: Aortic atherosclerosis. No enlarged abdominal or pelvic lymph nodes. Reproductive: Status post hysterectomy. No adnexal masses. Other: No abdominal wall  hernia or abnormality. No abdominopelvic ascites. Musculoskeletal: Postsurgical changes are noted in the lower lumbar spine. No acute bony abnormality is seen.   Colonoscopy 10/18/2013 at [facility].      Past Medical History:  Diagnosis Date  . Arthritis    DJD, low back, thumb  . Atrial fibrillation (HCC)    Xarelto anticoagulation. Flecainide antiarrythmic   .  Chicken pox   . Chronic pain syndrome    On disability. History of bilateral hip pain, low back pain. Gabapentin 400 BID, percocet 1 tablet daily per prior provider.   . Colon polyp    awaiting records  . Depression    zoloft 100mg , remeron 30mg  per psychiatry. ambien 10mg  per psychiatry to help wtih sleep element.   . Dystonia    described as psychogenic dystonia. Pain and twisting from upper chest and up ith triggers "wind, creamy food" on TID ativan per psychiatry previously.   . Fibromyalgia   . GERD (gastroesophageal reflux disease)    omeprazole OTC  . Goiter    states multiple imaging tests, has had biopsies  . Hyperlipidemia    lovastatin 20mg   . Stroke (HCC)    TIA (left side of face and body decreased sensitivity than right face and side)  . TIA (transient ischemic attack)   . Vaginal atrophy    estrace vaginal cream   Past Surgical History:  Procedure Laterality Date  . ATRIAL FIBRILLATION ABLATION    . BIOPSY THYROID    . fusion l4-l5  2000  . LAMINECTOMY     L4-L5  . OTHER SURGICAL HISTORY     tennis elbow surgery  . piriformis release  1999  . TONSILLECTOMY AND ADENOIDECTOMY  age 69  . VAGINAL HYSTERECTOMY  1993  trigger finger right hand   reports that she has never smoked. She has never used smokeless tobacco. She reports that she does not drink alcohol or use drugs. family history includes Alcohol abuse in her father and mother; Cancer in her brother and brother; Diabetes in her brother; Hypertension in her father; Pancreatic cancer in her father; Skin cancer in her brother.  Brother prosate cancer. Brother spindle cell carcinoma.   Allergies  Allergen Reactions  . Reglan [Metoclopramide] Other (See Comments)    Suicidal thoughts  . Ciprofloxacin Diarrhea  . Clindamycin/Lincomycin Diarrhea  . Vibramycin [Doxycycline] Diarrhea  . Macrobid [Nitrofurantoin Monohyd Macro] Rash    Rash on lip  . Sulfa Antibiotics Rash     Outpatient Encounter Medications  as of 11/21/2019  Medication Sig  . acetaminophen (TYLENOL) 500 MG tablet Take 1,000 mg by mouth every 6 (six) hours as needed for mild pain, moderate pain, fever or headache.  Marland Kitchen atorvastatin (LIPITOR) 20 MG tablet Take 1 tablet (20 mg total) by mouth daily.  Marland Kitchen buPROPion (WELLBUTRIN XL) 300 MG 24 hr tablet Take 300 mg by mouth every morning.  . cephALEXin (KEFLEX) 250 MG capsule   . cholecalciferol (VITAMIN D) 1000 units tablet Take 1,000 Units by mouth daily.  Marland Kitchen co-enzyme Q-10 30 MG capsule Take 30 mg by mouth daily.  . Cyanocobalamin (B-12 PO) Take by mouth.  Lyndle Herrlich SULFATE PO Take by mouth.  . flecainide (TAMBOCOR) 100 MG tablet Take 1 tablet (100 mg total) by mouth 2 (two) times daily.  Marland Kitchen gabapentin (NEURONTIN) 300 MG capsule Take 900 mg by mouth at bedtime.   Marland Kitchen HYDROcodone-acetaminophen (NORCO) 5-325 MG tablet Take 0.5 tablets by mouth 2 (two) times daily as needed for moderate  pain (may refill today).  Marland Kitchen HYDROcodone-acetaminophen (NORCO/VICODIN) 5-325 MG tablet Take 0.5 tablets by mouth 2 (two) times daily. May refill in 1 month  . HYDROcodone-acetaminophen (NORCO/VICODIN) 5-325 MG tablet Take 0.5 tablets by mouth 2 (two) times daily. May refill in 2 months  . Melatonin 3 MG TABS Take 2 tablets by mouth at bedtime.   . Multiple Vitamin (MULTIVITAMIN WITH MINERALS) TABS tablet Take 1 tablet by mouth daily.  Marland Kitchen omeprazole (PRILOSEC) 20 MG capsule Take 1 capsule (20 mg total) by mouth 2 (two) times daily before a meal.  . ondansetron (ZOFRAN ODT) 4 MG disintegrating tablet Take 1 tablet (4 mg total) by mouth every 8 (eight) hours as needed for nausea or vomiting.  . rivaroxaban (XARELTO) 20 MG TABS tablet TAKE 1 TABLET BY MOUTH ONCE DAILY WITH  SUPPER  . traZODone (DESYREL) 150 MG tablet Take 300 mg by mouth at bedtime.   No facility-administered encounter medications on file as of 11/21/2019.     REVIEW OF SYSTEMS: All other systems reviewed and negative except where noted in the History  of Present Illness.   PHYSICAL EXAM: General: Well developed  69 year old female in no acute distress. Head: Normocephalic and atraumatic. Eyes:  Sclerae non-icteric, conjunctive pink. Ears: Normal auditory acuity. Mouth: Dentition intact. No ulcers or lesions.  Neck: Supple, no lymphadenopathy or thyromegaly.  Lungs: Clear bilaterally to auscultation without wheezes, crackles or rhonchi. Heart: Regular rate and rhythm. No murmur, rub or gallop appreciated.  Abdomen: Soft, non distended. Mild tenderness epigastric, RLQ and central lower abdomen without rebound or guarding. No masses. No hepatosplenomegaly. Normoactive bowel sounds x 4 quadrants.  Rectal: Deferred.  Musculoskeletal: Symmetrical with no gross deformities. Skin: Warm and dry. No rash or lesions on visible extremities. Extremities: No edema. Neurological: Alert oriented x 4, no focal deficits.  Psychological:  Alert and cooperative. Normal mood and affect.  ASSESSMENT AND PLAN:  1. Nausea -Abdominal sonogram to further assess the gallbladder -Ondansetron 8mg  ODT 1 tab Q 8 hrs PRN -Call office  if symptoms worsen  2. GERD - EGD benefits and risks discussed including risk with sedation, risk of bleeding, perforation and infection   3. Colon cancer screening  -Colonoscopy benefits and risks discussed including risk with sedation, risk of bleeding, perforation and infection   4. Atrial fibrillation on Xarelto on Flecainide -Our office will contact your cardiologist to verify Xarelto instructions prior to your EGD and colonoscopy    5. Patient reports a history of anemia, ? on Ferrous Sulfate 325mg  bid. Hg 14 - 15.3.   Further follow up to be determined after EGD and colonoscopy completed      CC:  , MD

## 2019-11-21 ENCOUNTER — Ambulatory Visit: Payer: Medicare HMO | Admitting: Nurse Practitioner

## 2019-11-21 ENCOUNTER — Encounter: Payer: Self-pay | Admitting: Nurse Practitioner

## 2019-11-21 VITALS — BP 100/64 | HR 76 | Temp 98.5°F | Ht 67.25 in | Wt 163.4 lb

## 2019-11-21 DIAGNOSIS — K5900 Constipation, unspecified: Secondary | ICD-10-CM

## 2019-11-21 DIAGNOSIS — R110 Nausea: Secondary | ICD-10-CM

## 2019-11-21 DIAGNOSIS — R1084 Generalized abdominal pain: Secondary | ICD-10-CM | POA: Diagnosis not present

## 2019-11-21 MED ORDER — NA SULFATE-K SULFATE-MG SULF 17.5-3.13-1.6 GM/177ML PO SOLN: 1.0000 | Freq: Once | ORAL | 0 refills | Status: AC

## 2019-11-21 MED ORDER — ONDANSETRON 8 MG PO TBDP: 8.0000 mg | ORAL_TABLET | Freq: Three times a day (TID) | ORAL | 0 refills | Status: DC | PRN

## 2019-11-21 NOTE — Patient Instructions (Addendum)
If you are age 68 or older, your body mass index should be between 23-30. Your Body mass index is 25.4 kg/m. If this is out of the aforementioned range listed, please consider follow up with your Primary Care Provider.  If you are age 48 or younger, your body mass index should be between 19-25. Your Body mass index is 25.4 kg/m. If this is out of the aformentioned range listed, please consider follow up with your Primary Care Provider.   You have been scheduled for an abdominal ultrasound at [facility] (1st floor of hospital) on 11/25/2019 at 9:00. Please arrive 15 minutes prior to your appointment for registration. Make certain not to have anything to eat or drink 6 hours prior to your appointment. Should you need to reschedule your appointment, please contact radiology at [phone]. This test typically takes about 30 minutes to perform.   We have sent the following medications to your pharmacy for you to pick up at your convenience:  Ondansetron 8 mg Suprep

## 2019-11-22 NOTE — Progress Notes (Signed)
I agree with the above note, plan 

## 2019-11-25 ENCOUNTER — Other Ambulatory Visit: Payer: Self-pay

## 2019-11-25 ENCOUNTER — Ambulatory Visit (HOSPITAL_COMMUNITY)
Admission: RE | Admit: 2019-11-25 | Discharge: 2019-11-25 | Disposition: A | Discharge status: Home / Self Care | Payer: Medicare HMO | Source: Ambulatory Visit | Attending: Nurse Practitioner | Admitting: Nurse Practitioner

## 2019-11-25 DIAGNOSIS — K5900 Constipation, unspecified: Secondary | ICD-10-CM | POA: Diagnosis not present

## 2019-11-25 DIAGNOSIS — R110 Nausea: Secondary | ICD-10-CM | POA: Diagnosis not present

## 2019-11-25 DIAGNOSIS — R1084 Generalized abdominal pain: Secondary | ICD-10-CM | POA: Diagnosis not present

## 2019-11-28 DIAGNOSIS — R69 Illness, unspecified: Secondary | ICD-10-CM | POA: Diagnosis not present

## 2019-12-06 ENCOUNTER — Other Ambulatory Visit: Payer: Self-pay | Admitting: Nurse Practitioner

## 2019-12-07 ENCOUNTER — Ambulatory Visit: Payer: Medicare HMO | Attending: Internal Medicine

## 2019-12-07 DIAGNOSIS — Z23 Encounter for immunization: Secondary | ICD-10-CM

## 2019-12-07 NOTE — Progress Notes (Signed)
   Covid-19 Vaccination Clinic  Name:  Renalda Locklin    MRN: [id number] DOB: [birth year]  12/07/2019  Ms. Buskey was observed post Covid-19 immunization for 15 minutes without incident. She was provided with Vaccine Information Sheet and instruction to access the V-Safe system.   Ms. Rogoff was instructed to call 911 with any severe reactions post vaccine: Marland Kitchen Difficulty breathing  . Swelling of face and throat  . A fast heartbeat  . A bad rash all over body  . Dizziness and weakness   Immunizations Administered    Name Date Dose VIS Date Route   Pfizer COVID-19 Vaccine 12/07/2019  9:05 AM 0.3 mL 09/02/2019 Intramuscular   Manufacturer: [employer], [employer]   Lot: [id number]   NDC: [id number]

## 2019-12-19 ENCOUNTER — Telehealth: Payer: Self-pay | Admitting: Family Medicine

## 2019-12-19 NOTE — Telephone Encounter (Signed)
Chief Complaint Headache Reason for Call Request to Schedule Office Appointment Initial Comment Caller states that she has dystonia again. Wants an appointment. Or does she have to go to Psychiatrist? Translation No Nurse Assessment Nurse: Lily Kocher, RN, Adriana Date/Time (Eastern Time): 12/18/2019 7:19:26 PM Confirm and document reason for call. If symptomatic, describe symptoms. ---pt states she is experiencing dystonia, mainly to neck and face. also restless leg. states hasn't happened for a few years. states psychiatrist handled it last time in [place]. sx onset at beginning of week, but "full blown" on Friday. denies any other sx. pain to neck and shoulders. pain of 5/10 Has the patient had close contact with a person known or suspected to have the novel coronavirus illness OR traveled / lives in area with major community spread (including international travel) in the last 14 days from the onset of symptoms? * If Asymptomatic, screen for exposure and travel within the last 14 days. ---No Does the patient have any new or worsening symptoms? ---Yes Will a triage be completed? ---Yes Related visit to physician within the last 2 weeks? ---No Does the PT have any chronic conditions? (i.e. diabetes, asthma, this includes High risk factors for pregnancy, etc.) ---Yes List chronic conditions. ---depression TIA-on xarelto GERD Is this a behavioral health or substance abuse call? ---No Guidelines Guideline Title Affirmed Question Affirmed Notes Nurse Date/Time (Eastern Time) Muscle Jerks - Tics - Shudders [1] Muscle rigidity or tightness AND [2] present now Hardie Shackleton 12/18/2019 7:23:58 PM PLEASE NOTE: All timestamps contained within this report are represented as [place] Standard Time. CONFIDENTIALTY NOTICE: This fax transmission is intended only for the addressee. It contains information that is legally privileged, confidential or otherwise protected from use or disclosure. If you  are not the intended recipient, you are strictly prohibited from reviewing, disclosing, copying using or disseminating any of this information or taking any action in reliance on or regarding this information. If you have received this fax in error, please notify us immediately by telephone so that we can arrange for its return to [place]. Phone: [phone], Toll-Free: [phone], Fax: [phone] Page: 2 of 2 Call Id: [id number] Disp. Time Lamount Cohen Time) Disposition Final User 12/18/2019 7:25:18 PM Paged On Call back to Call Center [place], RN, Ricki Rodriguez 12/18/2019 7:31:38 PM Go to ED Now (or PCP triage) Yes Lily Kocher, RN, Adriana Caller Disagree/Comply Comply Caller Understands Yes PreDisposition Call Doctor Care Advice Given Per Guideline GO TO ED NOW (OR PCP TRIAGE): CARE ADVICE given per Muscle Jerks - Tics - Shudders (Adult) guideline. * IF PCP SECOND-LEVEL TRIAGE REQUIRED: You may need to be seen. Your doctor (or NP/PA) will want to talk with you to decide what's best. I'll page the provider on-call now. If you haven't heard from the provider (or me) within 30 minutes, go directly to the ED/UCC at _____________ Hospital. Referrals GO TO FACILITY UNDECIDED Paging DoctorName Phone DateTime Result/Outcome Message Type Notes Gershon Crane - MD [phone] 12/18/2019 7:25:18 PM Paged On Call Back to Call Center Doctor Paged [phone] Maryfrances Bunnell Gershon Crane - MD 12/18/2019 7:32:32 PM Spoke with On Call - General Message Result UC tonight if sx causing discomfort, states to speak to psychiatrist about changes in her medication and dystonia

## 2019-12-19 NOTE — Telephone Encounter (Signed)
Patient really needs to see psychiatry-they will be the ones adjusting her medications.  You may cancel visit with me unless she has another concern.  I did see headache listed but that may be related to the dystonia

## 2019-12-19 NOTE — Telephone Encounter (Signed)
Patient has been scheduled for VV 3.31.21 @ 11:40

## 2019-12-20 NOTE — Telephone Encounter (Signed)
Happy to see her but would also call her psychiatric NP to see if they have any tips more urgently since she is having issues- they may be able to work her in

## 2019-12-20 NOTE — Telephone Encounter (Signed)
Spoke with patient, she currently does not have psychiatrist here in [place]. Last psychiatrist was in [place]. Her NP appt with psychiatry is next week. Adamant on keeping appt with PCP tomorrow to discuss everything.

## 2019-12-21 ENCOUNTER — Other Ambulatory Visit: Payer: Self-pay

## 2019-12-21 ENCOUNTER — Telehealth (INDEPENDENT_AMBULATORY_CARE_PROVIDER_SITE_OTHER): Payer: Medicare HMO | Admitting: Family Medicine

## 2019-12-21 ENCOUNTER — Encounter: Payer: Self-pay | Admitting: Family Medicine

## 2019-12-21 VITALS — BP 125/64 | HR 68 | Temp 99.3°F | Ht 67.25 in | Wt 163.0 lb

## 2019-12-21 DIAGNOSIS — G249 Dystonia, unspecified: Secondary | ICD-10-CM

## 2019-12-21 MED ORDER — CARBIDOPA-LEVODOPA 25-100 MG PO TABS: 0.5000 | ORAL_TABLET | Freq: Two times a day (BID) | ORAL | 1 refills | Status: DC

## 2019-12-21 NOTE — Patient Instructions (Signed)
There are no preventive care reminders to display for this patient.  Depression screen [facility] 2/9 10/28/2019 08/08/2019 08/04/2019  Decreased Interest 0 0 0  Down, Depressed, Hopeless 1 0 3  PHQ - 2 Score 1 0 3  Altered sleeping 0 0 3  Tired, decreased energy 1 0 3  Change in appetite 1 0 0  Feeling bad or failure about yourself  0 0 0  Trouble concentrating 0 0 3  Moving slowly or fidgety/restless 0 0 0  Suicidal thoughts 0 0 0  PHQ-9 Score 3 0 12  Difficult doing work/chores Not difficult at all Not difficult at all Very difficult  Some recent data might be hidden    Recommended follow up: No follow-ups on file.

## 2019-12-21 NOTE — Telephone Encounter (Signed)
Pt scheduled for today.  

## 2019-12-21 NOTE — Progress Notes (Signed)
Phone [phone] Virtual visit via Video note   Subjective:  Chief complaint: Chief Complaint  Patient presents with  . Other    dystonia    This visit type was conducted due to national recommendations for restrictions regarding the COVID-19 Pandemic (e.g. social distancing).  This format is felt to be most appropriate for this patient at this time balancing risks to patient and risks to population by having him in for in person visit.  No physical exam was performed (except for noted visual exam or audio findings with Telehealth visits).    Our team/I connected with Smitty Pluck at 11:40 AM EDT by a video enabled telemedicine application (doxy.me or caregility through epic) and verified that I am speaking with the correct person using two identifiers.  Location patient: [place] Location provider: [facility], office Persons participating in the virtual visit:  patient  Our team/I discussed the limitations of evaluation and management by telemedicine and the availability of in person appointments. In light of current covid-19 pandemic, patient also understands that we are trying to protect them by minimizing in office contact if at all possible.  The patient expressed consent for telemedicine visit and agreed to proceed. Patient understands insurance will be billed.   Past Medical History-  Patient Active Problem List   Diagnosis Date Noted  . Major depression in full remission ([place]) 09/02/2016    Priority: High  . History of SI/intentional drug overdose 11/22/2015    Priority: High  . Atrial fibrillation ([place])     Priority: High  . Chronic pain syndrome     Priority: High  . Restless legs 07/27/2018    Priority: Medium  . Recurrent UTI 10/07/2016    Priority: Medium  . Hyperlipidemia     Priority: Medium  . GERD (gastroesophageal reflux disease) 01/30/2016    Priority: Low    Medications- reviewed and updated Current Outpatient Medications  Medication Sig Dispense  Refill  . acetaminophen (TYLENOL) 500 MG tablet Take 1,000 mg by mouth every 6 (six) hours as needed for mild pain, moderate pain, fever or headache.    Marland Kitchen atorvastatin (LIPITOR) 20 MG tablet Take 1 tablet (20 mg total) by mouth daily. 90 tablet 3  . buPROPion (WELLBUTRIN XL) 300 MG 24 hr tablet Take 300 mg by mouth every morning.    . cephALEXin (KEFLEX) 250 MG capsule     . cholecalciferol (VITAMIN D) 1000 units tablet Take 1,000 Units by mouth daily.    Marland Kitchen co-enzyme Q-10 30 MG capsule Take 30 mg by mouth daily.    . Cyanocobalamin (B-12 PO) Take by mouth.    Lyndle Herrlich SULFATE PO Take by mouth.    . flecainide (TAMBOCOR) 100 MG tablet Take 1 tablet (100 mg total) by mouth 2 (two) times daily. 180 tablet 3  . gabapentin (NEURONTIN) 300 MG capsule Take 900 mg by mouth at bedtime.     . Melatonin 3 MG TABS Take 2 tablets by mouth at bedtime.     . Multiple Vitamin (MULTIVITAMIN WITH MINERALS) TABS tablet Take 1 tablet by mouth daily.    Marland Kitchen omeprazole (PRILOSEC) 20 MG capsule Take 1 capsule (20 mg total) by mouth 2 (two) times daily before a meal. 180 capsule 3  . ondansetron (ZOFRAN-ODT) 8 MG disintegrating tablet DISSOLVE 1 TABLET IN MOUTH EVERY 8 HOURS AS NEEDED FOR NAUSEA FOR VOMITING 30 tablet 0  . rivaroxaban (XARELTO) 20 MG TABS tablet TAKE 1 TABLET BY MOUTH ONCE DAILY WITH  SUPPER 90  tablet 3  . traZODone (DESYREL) 150 MG tablet Take 300 mg by mouth at bedtime.    . carbidopa-levodopa (SINEMET) 25-100 MG tablet Take 0.5 tablets by mouth 2 (two) times daily. 60 tablet 1  . HYDROcodone-acetaminophen (NORCO/VICODIN) 5-325 MG tablet TAKE 1 2 (ONE HALF) TABLET BY MOUTH TWICE DAILY     No current facility-administered medications for this visit.     Objective:  BP 125/64   Pulse 68   Temp 99.3 F (37.4 C) (Temporal) Comment (Src): on heating pad due to neck  Ht 5' 7.25" (1.708 m)   Wt 163 lb (73.9 kg)   LMP  (LMP Unknown)   BMI 25.34 kg/m  self reported vitals Gen: NAD, resting  comfortably Neck appears stiff- difficult for patient to turn toward right or left- easier going up or down. Some shakiness in neck noted- patient reports due to tension Lungs: nonlabored, normal respiratory rate  Skin: appears dry, no obvious rash     Assessment and Plan   #Dystonia S: Patient with history of dystonia-previously was told this was psychogenic [place].  Previously has been treated with Ativan she reports for this which was helpful   Last occurred last February- she was treated with levodopa-carbidopa but she does not recall if that helped. Constantly tight even in daytimes.  At that time recommendations were to follow-up with neurology for new consult as well as psychiatry as she was on Zyprexa-eventually Zyprexa was discontinued but she is not sure time course wise and symptoms resolved  Most of her symptoms are a stiffness in her neck and due to the severity of the stiffness also causes a slight shake.  She feels like she cannot turn her neck fully to the right or the left.  Her current symptoms started about a week ago but worsened this week.  She also feels like her legs are more rigid  A/P: Patient with recurrent dystonia.  Recommended psychiatry follow-up-occasionally Wellbutrin has been implicated with dystonia.  She has follow-up with Dr. Donell Beers soon.  Recommended neurology consult-he declines for now.  We ultimately opted to try Sinemet which was tried last year to see if this is helpful-considered though debated as first-line for idiopathic dystonia per up-to-date -Patient wanted to try Ativan but I would like to try the Sinemet first-her visit would prefer to keep her off of benzodiazepines and hydrocodone at the same time-may need to consider alternate like -Ultimately may need to see neurology even though she declines for now  Recommended follow up: I asked her to update me with how she does on Sinemet and what psychiatrist as-if new or worsening  symptoms she should seek care Future Appointments  Date Time Provider Department Center  01/02/2020  9:00 AM Rachael Fee, MD [facility]    Lab/Order associations:   ICD-10-CM   1. Dystonia  G24.9     Meds ordered this encounter  Medications  . carbidopa-levodopa (SINEMET) 25-100 MG tablet    Sig: Take 0.5 tablets by mouth 2 (two) times daily.    Dispense:  60 tablet    Refill:  1   Return precautions advised.  Tana Conch, MD

## 2019-12-23 ENCOUNTER — Other Ambulatory Visit: Payer: Self-pay | Admitting: Nurse Practitioner

## 2019-12-26 ENCOUNTER — Encounter: Payer: Self-pay | Admitting: Family Medicine

## 2019-12-26 DIAGNOSIS — R69 Illness, unspecified: Secondary | ICD-10-CM | POA: Diagnosis not present

## 2019-12-29 ENCOUNTER — Encounter: Payer: Self-pay | Admitting: Gastroenterology

## 2019-12-30 ENCOUNTER — Telehealth: Payer: Self-pay

## 2019-12-30 ENCOUNTER — Telehealth: Payer: Self-pay | Admitting: General Surgery

## 2019-12-30 ENCOUNTER — Telehealth: Payer: Self-pay | Admitting: Family Medicine

## 2019-12-30 NOTE — Telephone Encounter (Signed)
Did you see this? 

## 2019-12-30 NOTE — Telephone Encounter (Signed)
She may hold anticoagulant the day of and the day prior to the procedure and restart the day afterwards.  If GI strongly prefers for to hold 2 days before and restart 2 days after procedure-ultimately I am okay with that as well

## 2019-12-30 NOTE — Telephone Encounter (Signed)
  12/30/2019   RE: Danielle Rocha DOB: 1951/05/06 MRN: [id number]   Dear Dr. Durene Cal,    We have scheduled the above patient for an endoscopic procedure. Our records show that she is on anticoagulation therapy.   Please advise as to how long the patient may come off her therapy of Xarelto prior to the procedure, which is scheduled for 01/02/2020.  Please fax back/ or route the completed form to [place] at [phone].   Sincerely,

## 2019-12-30 NOTE — Telephone Encounter (Signed)
Per Dr. Durene Cal: She may hold anticoagulant the day of and the day prior to the procedure and restart the day afterwards.  If GI strongly prefers for to hold 2 days before and restart 2 days after procedure-ultimately I am okay with that as well   Let me know if you need anything further from me.  Donnamarie Poag, CMA

## 2019-12-30 NOTE — Telephone Encounter (Signed)
Sent anti -coag letter to Dr. Durene Cal asking for permission for patient to come off her Xarelto prior to her procedure.  Spoke with his staff who is going to send [place] this information shortly

## 2019-12-30 NOTE — Telephone Encounter (Signed)
Spoke with the patient regarding Xarelto, the patient will stop her Xarelto on Saturday and start back on Wednesday.

## 2019-12-30 NOTE — Telephone Encounter (Signed)
Sent information in mychart message.

## 2019-12-30 NOTE — Telephone Encounter (Signed)
Verlon Au with GI called stating that patient has a procedure on Monday.  States they had not sent the anticoagulant letter over.  States has sent it through Epic to Dr. Durene Cal.  Would like form sent back as soon as possible.  Can be sent back to her through Epic or faxed to the numbers on the form.

## 2019-12-30 NOTE — Telephone Encounter (Signed)
Patient has been notified

## 2019-12-30 NOTE — Telephone Encounter (Signed)
Team see my response on other note sent today

## 2020-01-02 ENCOUNTER — Encounter: Payer: Self-pay | Admitting: Gastroenterology

## 2020-01-02 ENCOUNTER — Ambulatory Visit (AMBULATORY_SURGERY_CENTER): Payer: Medicare HMO | Admitting: Gastroenterology

## 2020-01-02 ENCOUNTER — Other Ambulatory Visit: Payer: Self-pay

## 2020-01-02 VITALS — BP 115/55 | HR 68 | Temp 97.3°F | Resp 19 | Ht 67.0 in | Wt 163.0 lb

## 2020-01-02 DIAGNOSIS — K2950 Unspecified chronic gastritis without bleeding: Secondary | ICD-10-CM | POA: Diagnosis not present

## 2020-01-02 DIAGNOSIS — Z1211 Encounter for screening for malignant neoplasm of colon: Secondary | ICD-10-CM | POA: Diagnosis not present

## 2020-01-02 DIAGNOSIS — R1084 Generalized abdominal pain: Secondary | ICD-10-CM | POA: Diagnosis not present

## 2020-01-02 DIAGNOSIS — I4891 Unspecified atrial fibrillation: Secondary | ICD-10-CM | POA: Diagnosis not present

## 2020-01-02 DIAGNOSIS — K5900 Constipation, unspecified: Secondary | ICD-10-CM | POA: Diagnosis not present

## 2020-01-02 DIAGNOSIS — K2970 Gastritis, unspecified, without bleeding: Secondary | ICD-10-CM

## 2020-01-02 DIAGNOSIS — R110 Nausea: Secondary | ICD-10-CM | POA: Diagnosis not present

## 2020-01-02 DIAGNOSIS — M797 Fibromyalgia: Secondary | ICD-10-CM | POA: Diagnosis not present

## 2020-01-02 DIAGNOSIS — K449 Diaphragmatic hernia without obstruction or gangrene: Secondary | ICD-10-CM

## 2020-01-02 DIAGNOSIS — K219 Gastro-esophageal reflux disease without esophagitis: Secondary | ICD-10-CM | POA: Diagnosis not present

## 2020-01-02 MED ORDER — SODIUM CHLORIDE 0.9 % IV SOLN: 500.0000 mL | Freq: Once | INTRAVENOUS | Status: DC

## 2020-01-02 NOTE — Patient Instructions (Signed)
Handouts provided on diverticulosis, hemorrhoids, gastritis and hiatal hernia.  It is safe to resume your Xarelto today.  YOU HAD AN ENDOSCOPIC PROCEDURE TODAY AT THE [facility]:   Refer to the procedure report that was given to you for any specific questions about what was found during the examination.  If the procedure report does not answer your questions, please call your gastroenterologist to clarify.  If you requested that your care partner not be given the details of your procedure findings, then the procedure report has been included in a sealed envelope for you to review at your convenience later.  YOU SHOULD EXPECT: Some feelings of bloating in the abdomen. Passage of more gas than usual.  Walking can help get rid of the air that was put into your GI tract during the procedure and reduce the bloating. If you had a lower endoscopy (such as a colonoscopy or flexible sigmoidoscopy) you may notice spotting of blood in your stool or on the toilet paper. If you underwent a bowel prep for your procedure, you may not have a normal bowel movement for a few days.  Please Note:  You might notice some irritation and congestion in your nose or some drainage.  This is from the oxygen used during your procedure.  There is no need for concern and it should clear up in a day or so.  SYMPTOMS TO REPORT IMMEDIATELY:   Following lower endoscopy (colonoscopy or flexible sigmoidoscopy):  Excessive amounts of blood in the stool  Significant tenderness or worsening of abdominal pains  Swelling of the abdomen that is new, acute  Fever of 100F or higher   Following upper endoscopy (EGD)  Vomiting of blood or coffee ground material  New chest pain or pain under the shoulder blades  Painful or persistently difficult swallowing  New shortness of breath  Fever of 100F or higher  Black, tarry-looking stools  For urgent or emergent issues, a gastroenterologist can be reached at any hour by  calling [phone]. Do not use MyChart messaging for urgent concerns.    DIET:  We do recommend a small meal at first, but then you may proceed to your regular diet.  Drink plenty of fluids but you should avoid alcoholic beverages for 24 hours.  ACTIVITY:  You should plan to take it easy for the rest of today and you should NOT DRIVE or use heavy machinery until tomorrow (because of the sedation medicines used during the test).    FOLLOW UP: Our staff will call the number listed on your records 48-72 hours following your procedure to check on you and address any questions or concerns that you may have regarding the information given to you following your procedure. If we do not reach you, we will leave a message.  We will attempt to reach you two times.  During this call, we will ask if you have developed any symptoms of COVID 19. If you develop any symptoms (ie: fever, flu-like symptoms, shortness of breath, cough etc.) before then, please call [phone].  If you test positive for Covid 19 in the 2 weeks post procedure, please call and report this information to [place].    If any biopsies were taken you will be contacted by phone or by letter within the next 1-3 weeks.  Please call us at [phone] if you have not heard about the biopsies in 3 weeks.    SIGNATURES/CONFIDENTIALITY: You and/or your care partner have signed paperwork which will be  entered into your electronic medical record.  These signatures attest to the fact that that the information above on your After Visit Summary has been reviewed and is understood.  Full responsibility of the confidentiality of this discharge information lies with you and/or your care-partner.

## 2020-01-02 NOTE — Op Note (Signed)
[facility] Patient Name: Danielle Rocha Procedure Date: 01/02/2020 8:52 AM MRN: [id number] Endoscopist: Rachael Fee , MD Age: 69 Referring MD:  Date of Birth: 1951-07-15 Gender: Female Account #: [id number] Procedure:                Colonoscopy Indications:              Screening for colorectal malignant neoplasm Medicines:                Monitored Anesthesia Care Procedure:                Pre-Anesthesia Assessment:                           - Prior to the procedure, a History and Physical                            was performed, and patient medications and                            allergies were reviewed. The patient's tolerance of                            previous anesthesia was also reviewed. The risks                            and benefits of the procedure and the sedation                            options and risks were discussed with the patient.                            All questions were answered, and informed consent                            was obtained. Prior Anticoagulants: The patient has                            taken Xarelto (rivaroxaban), last dose was 2 days                            prior to procedure. ASA Grade Assessment: III - A                            patient with severe systemic disease. After                            reviewing the risks and benefits, the patient was                            deemed in satisfactory condition to undergo the                            procedure.  After obtaining informed consent, the colonoscope                            was passed under direct vision. Throughout the                            procedure, the patient's blood pressure, pulse, and                            oxygen saturations were monitored continuously. The                            Colonoscope was introduced through the anus and                            advanced to the the cecum, identified by                      appendiceal orifice and ileocecal valve. The                            colonoscopy was performed without difficulty. The                            patient tolerated the procedure well. The quality                            of the bowel preparation was good. The ileocecal                            valve, appendiceal orifice, and rectum were                            photographed. Scope In: 8:54:54 AM Scope Out: 9:06:02 AM Scope Withdrawal Time: 0 hours 6 minutes 54 seconds  Total Procedure Duration: 0 hours 11 minutes 8 seconds  Findings:                 Multiple small and large-mouthed diverticula were                            found in the left colon.                           External and internal hemorrhoids were found. The                            hemorrhoids were small.                           The exam was otherwise without abnormality on                            direct and retroflexion views. Complications:            No immediate complications. Estimated blood loss:  None. Estimated Blood Loss:     Estimated blood loss: none. Impression:               - Diverticulosis in the left colon.                           - External and internal hemorrhoids.                           - The examination was otherwise normal on direct                            and retroflexion views.                           - No specimens collected. Recommendation:           - Patient has a contact number available for                            emergencies. The signs and symptoms of potential                            delayed complications were discussed with the                            patient. Return to normal activities tomorrow.                            Written discharge instructions were provided to the                            patient.                           - Resume previous diet.                           - Continue present  medications.                           - Repeat colonoscopy in 10 years for screening. Milus Banister, MD 01/02/2020 9:15:07 AM This report has been signed electronically.

## 2020-01-02 NOTE — Telephone Encounter (Signed)
Patient approved to come off of Xarelto prior to procedure

## 2020-01-02 NOTE — Progress Notes (Signed)
Called to room to assist during endoscopic procedure.  Patient ID and intended procedure confirmed with present staff. Received instructions for my participation in the procedure from the performing physician.  

## 2020-01-02 NOTE — Progress Notes (Signed)
VS by KA. Temp by LC. 

## 2020-01-02 NOTE — Progress Notes (Signed)
Report to PACU, RN, vss, BBS= Clear.  

## 2020-01-02 NOTE — Op Note (Addendum)
[place] Patient Name: Danielle Rocha Procedure Date: 01/02/2020 8:52 AM MRN: [id number] Endoscopist: Milus Banister , MD Age: 69 Referring MD:  Date of Birth: September 02, 1951 Gender: Female Account #: [id number] Procedure:                Upper GI endoscopy Indications:              Nausea Medicines:                Monitored Anesthesia Care Procedure:                Pre-Anesthesia Assessment:                           - Prior to the procedure, a History and Physical                            was performed, and patient medications and                            allergies were reviewed. The patient's tolerance of                            previous anesthesia was also reviewed. The risks                            and benefits of the procedure and the sedation                            options and risks were discussed with the patient.                            All questions were answered, and informed consent                            was obtained. Prior Anticoagulants: The patient has                            taken Xarelto (rivaroxaban), last dose was 2 days                            prior to procedure. ASA Grade Assessment: III - A                            patient with severe systemic disease. After                            reviewing the risks and benefits, the patient was                            deemed in satisfactory condition to undergo the                            procedure.  After obtaining informed consent, the endoscope was                            passed under direct vision. Throughout the                            procedure, the patient's blood pressure, pulse, and                            oxygen saturations were monitored continuously. The                            Endoscope was introduced through the mouth, and                            advanced to the second part of duodenum. The upper                            GI  endoscopy was accomplished without difficulty.                            The patient tolerated the procedure well. Scope In: Scope Out: Findings:                 Mild inflammation characterized by erythema was                            found in the gastric body and in the gastric                            antrum. Biopsies were taken with a cold forceps for                            histology.                           A small hiatal hernia was present.                           The exam was otherwise without abnormality. Complications:            No immediate complications. Estimated blood loss:                            None. Estimated Blood Loss:     Estimated blood loss: none. Impression:               - Mild, non-specific gastritis. Biopsied to check                            for H. pylori.                           - Small hiatal hernia.                           -  The examination was otherwise normal. Recommendation:           - Patient has a contact number available for                            emergencies. The signs and symptoms of potential                            delayed complications were discussed with the                            patient. Return to normal activities tomorrow.                            Written discharge instructions were provided to the                            patient.                           - Resume previous diet.                           - Continue present medications. It is safe to                            resume your blood thinner today.                           - Await pathology results. Will consider gastric                            emptying scan if gastric biopsies are                            non-diagnostic. Rachael Fee, MD 01/02/2020 9:18:32 AM This report has been signed electronically.

## 2020-01-04 ENCOUNTER — Telehealth: Payer: Self-pay

## 2020-01-04 NOTE — Telephone Encounter (Signed)
  Follow up Call-  Call back number 01/02/2020  Post procedure Call Back phone  # [phone]  Permission to leave phone message Yes  Some recent data might be hidden     Patient questions:  Do you have a fever, pain , or abdominal swelling? No. Pain Score  0 *  Have you tolerated food without any problems? Yes.    Have you been able to return to your normal activities? Yes.    Do you have any questions about your discharge instructions: Diet   No. Medications  No. Follow up visit  No.  Do you have questions or concerns about your Care? No.  Actions: * If pain score is 4 or above: No action needed, pain <4.  1. Have you developed a fever since your procedure? no  2.   Have you had an respiratory symptoms (SOB or cough) since your procedure? no  3.   Have you tested positive for COVID 19 since your procedure no  4.   Have you had any family members/close contacts diagnosed with the COVID 19 since your procedure?  no   If yes to any of these questions please route to Laverna Peace, RN and Charlett Lango, RN

## 2020-01-05 ENCOUNTER — Telehealth: Payer: Self-pay | Admitting: Family Medicine

## 2020-01-05 ENCOUNTER — Other Ambulatory Visit: Payer: Self-pay

## 2020-01-05 DIAGNOSIS — G249 Dystonia, unspecified: Secondary | ICD-10-CM

## 2020-01-05 NOTE — Telephone Encounter (Signed)
You can resubmit the referral to [facility].

## 2020-01-05 NOTE — Telephone Encounter (Signed)
See below

## 2020-01-05 NOTE — Telephone Encounter (Signed)
Received a call from the patient about a referral to [facility], the referral was place but denied because they do not treat Dystonia. Wanted to know where else she can go.

## 2020-01-05 NOTE — Telephone Encounter (Signed)
Referral resubmitted.

## 2020-01-06 ENCOUNTER — Other Ambulatory Visit: Payer: Self-pay

## 2020-01-06 DIAGNOSIS — R110 Nausea: Secondary | ICD-10-CM

## 2020-01-12 ENCOUNTER — Other Ambulatory Visit: Payer: Self-pay | Admitting: Nurse Practitioner

## 2020-01-23 ENCOUNTER — Ambulatory Visit (HOSPITAL_COMMUNITY)
Admission: RE | Admit: 2020-01-23 | Discharge: 2020-01-23 | Disposition: A | Discharge status: Home / Self Care | Payer: Medicare HMO | Source: Ambulatory Visit | Attending: Gastroenterology | Admitting: Gastroenterology

## 2020-01-23 ENCOUNTER — Other Ambulatory Visit: Payer: Self-pay

## 2020-01-23 DIAGNOSIS — R110 Nausea: Secondary | ICD-10-CM | POA: Insufficient documentation

## 2020-01-23 DIAGNOSIS — R112 Nausea with vomiting, unspecified: Secondary | ICD-10-CM | POA: Diagnosis not present

## 2020-01-23 DIAGNOSIS — R109 Unspecified abdominal pain: Secondary | ICD-10-CM | POA: Diagnosis not present

## 2020-01-23 MED ORDER — TECHNETIUM TC 99M SULFUR COLLOID
2.0000 | Freq: Once | INTRAVENOUS | Status: AC | PRN
  Administered 2020-01-23: 2 via INTRAVENOUS

## 2020-01-25 DIAGNOSIS — R69 Illness, unspecified: Secondary | ICD-10-CM | POA: Diagnosis not present

## 2020-02-07 ENCOUNTER — Encounter: Payer: Self-pay | Admitting: Family Medicine

## 2020-02-07 ENCOUNTER — Telehealth (INDEPENDENT_AMBULATORY_CARE_PROVIDER_SITE_OTHER): Payer: Medicare HMO | Admitting: Family Medicine

## 2020-02-07 VITALS — BP 121/69 | HR 58 | Temp 98.1°F | Ht 67.0 in | Wt 140.0 lb

## 2020-02-07 DIAGNOSIS — G894 Chronic pain syndrome: Secondary | ICD-10-CM

## 2020-02-07 DIAGNOSIS — K30 Functional dyspepsia: Secondary | ICD-10-CM

## 2020-02-07 DIAGNOSIS — I700 Atherosclerosis of aorta: Secondary | ICD-10-CM | POA: Diagnosis not present

## 2020-02-07 DIAGNOSIS — K219 Gastro-esophageal reflux disease without esophagitis: Secondary | ICD-10-CM | POA: Diagnosis not present

## 2020-02-07 DIAGNOSIS — F3342 Major depressive disorder, recurrent, in full remission: Secondary | ICD-10-CM

## 2020-02-07 DIAGNOSIS — E785 Hyperlipidemia, unspecified: Secondary | ICD-10-CM | POA: Diagnosis not present

## 2020-02-07 DIAGNOSIS — G249 Dystonia, unspecified: Secondary | ICD-10-CM | POA: Insufficient documentation

## 2020-02-07 DIAGNOSIS — R69 Illness, unspecified: Secondary | ICD-10-CM | POA: Diagnosis not present

## 2020-02-07 DIAGNOSIS — I480 Paroxysmal atrial fibrillation: Secondary | ICD-10-CM | POA: Diagnosis not present

## 2020-02-07 MED ORDER — CARBIDOPA-LEVODOPA 25-100 MG PO TABS: 1.0000 | ORAL_TABLET | Freq: Two times a day (BID) | ORAL | 1 refills | Status: DC

## 2020-02-07 NOTE — Assessment & Plan Note (Signed)
S: original notes I made in medical history when she tranferred care to me "described as psychogenic dystonia. Pain and twisting from upper chest and up with triggers "wind, creamy food" on TID ativan per psychiatry previously. "  Patient now with intermittent issues of this over the last year despite changes in psychiatric medications.  She has been off Zyprexa for some time now.  She is only on Wellbutrin, trazodone, gabapentin.  She is followed by Dr. Donell Beers  -we started on sinemet without improvement 0.5 tablets twice daily- will increase to full tablet -Per Dr. Donell Beers- Also started on ativan half mg in Am and 1 mg at night for dystonia- has not helped much  A/P: Ongoing issues with extreme neck tension/head motion.  No significant improvement on Sinemet per me or Ativan per psychiatry.  We will increase Sinemet dose to full tablet twice daily instead of half tablet. - scheduled with Dr. Terrace Arabia next week-hoping for some aid with patient who seems to be significantly uncomfortable with this

## 2020-02-07 NOTE — Assessment & Plan Note (Signed)
S:On disability due to chronic pain syndrome predating being cared for at [place]. Extensive prior orthopedic workup. issues mainly in low back and hips.   April 2021 Stopped hydrocodone 5/325 once per day-prior would take  full tablet once or splits and takes twice a day. She did not note significant increase in pain though dystonia is painful.    On gabapentin at bedtime from psychiatry- not sure this helps with pain.  -Pain worsened with PT A/P: Thankful patient has not had more significant pain issues despite being off of hydrocodone.  She will continue gabapentin through psychiatry.  We will continue to monitor off medications for now

## 2020-02-07 NOTE — Assessment & Plan Note (Signed)
S:compliant atorvastatin 20mg . LDL goal at least under 100 prefer under 70. She wants to work on lifestyle as much as able A/P: Poor control-we can recheck at next physical.  She wants to continue to work on healthy  eating/regular exercise-though those are difficult with current distress with dystonia

## 2020-02-07 NOTE — Assessment & Plan Note (Signed)
S:compliant with omeprazole (tried to come off and had to restart). She is on b12 and folate in regards to PPI risk of lowering these.   A/P: Good control- recommended pepcid trial once dystonia improved

## 2020-02-07 NOTE — Progress Notes (Signed)
Phone [phone] Virtual visit via Video note   Subjective:  Chief complaint: chronic pain  This visit type was conducted due to national recommendations for restrictions regarding the COVID-19 Pandemic (e.g. social distancing).  This format is felt to be most appropriate for this patient at this time balancing risks to patient and risks to population by having him in for in person visit.  No physical exam was performed (except for noted visual exam or audio findings with Telehealth visits).    Our team/I connected with Rosine Door at  1:40 PM EDT by a video enabled telemedicine application (doxy.me or caregility through epic) and verified that I am speaking with the correct person using two identifiers.  Location patient: Home[place] Location provider: [facility], office Persons participating in the virtual visit:  patient  Our team/I discussed the limitations of evaluation and management by telemedicine and the availability of in person appointments. In light of current covid-19 pandemic, patient also understands that we are trying to protect them by minimizing in office contact if at all possible.  The patient expressed consent for telemedicine visit and agreed to proceed. Patient understands insurance will be billed.   Past Medical History-  Patient Active Problem List   Diagnosis Date Noted  . Dystonia     Priority: High  . Major depression in full remission (HCC) 09/02/2016    Priority: High  . History of SI/intentional drug overdose 11/22/2015    Priority: High  . Atrial fibrillation (HCC)     Priority: High  . Chronic pain syndrome     Priority: High  . Aortic atherosclerosis (HCC) 02/07/2020    Priority: Medium  . Delayed gastric emptying 02/07/2020    Priority: Medium  . Restless legs 07/27/2018    Priority: Medium  . Recurrent UTI 10/07/2016    Priority: Medium  . Hyperlipidemia     Priority: Medium  . GERD (gastroesophageal reflux disease) 01/30/2016   Priority: Low    Medications- reviewed and updated Current Outpatient Medications  Medication Sig Dispense Refill  . acetaminophen (TYLENOL) 500 MG tablet Take 1,000 mg by mouth every 6 (six) hours as needed for mild pain, moderate pain, fever or headache.    Marland Kitchen atorvastatin (LIPITOR) 20 MG tablet Take 1 tablet (20 mg total) by mouth daily. 90 tablet 3  . buPROPion (WELLBUTRIN XL) 300 MG 24 hr tablet Take 300 mg by mouth every morning.    . carbidopa-levodopa (SINEMET) 25-100 MG tablet Take 1 tablet by mouth 2 (two) times daily. 60 tablet 1  . cephALEXin (KEFLEX) 250 MG capsule     . cholecalciferol (VITAMIN D) 1000 units tablet Take 1,000 Units by mouth daily.    Marland Kitchen co-enzyme Q-10 30 MG capsule Take 30 mg by mouth daily.    . Cyanocobalamin (B-12 PO) Take by mouth.    Di Kindle SULFATE PO Take by mouth.    . flecainide (TAMBOCOR) 100 MG tablet Take 1 tablet (100 mg total) by mouth 2 (two) times daily. 180 tablet 3  . gabapentin (NEURONTIN) 300 MG capsule Take 900 mg by mouth at bedtime.     Marland Kitchen LORazepam (ATIVAN) 0.5 MG tablet Take 1 mg by mouth at bedtime. Takes 1 in the am and 2 at night    . Melatonin 3 MG TABS Take 2 tablets by mouth at bedtime.     . Multiple Vitamin (MULTIVITAMIN WITH MINERALS) TABS tablet Take 1 tablet by mouth daily.    Marland Kitchen omeprazole (PRILOSEC) 20 MG capsule Take  1 capsule (20 mg total) by mouth 2 (two) times daily before a meal. 180 capsule 3  . ondansetron (ZOFRAN-ODT) 8 MG disintegrating tablet DISSOLVE 1 TABLET IN MOUTH EVERY 8 HOURS AS NEEDED FOR NAUSEA FOR VOMITING 30 tablet 0  . rivaroxaban (XARELTO) 20 MG TABS tablet TAKE 1 TABLET BY MOUTH ONCE DAILY WITH  SUPPER 90 tablet 3  . traZODone (DESYREL) 150 MG tablet Take 300 mg by mouth at bedtime.     No current facility-administered medications for this visit.     Objective:  BP 121/69   Pulse (!) 58   Temp 98.1 F (36.7 C)   Ht 5\' 7"  (1.702 m)   Wt 140 lb (63.5 kg)   LMP  (LMP Unknown)   BMI 21.93 kg/m   self reported vitals Gen: NAD, rhythmic neck/head motion throughout visit-appears uncomfortable Lungs: nonlabored, normal respiratory rate  Skin: appears dry, no obvious rash     Assessment and Plan   #Dystonia S: original notes I made in medical history when she tranferred care to me "described as psychogenic dystonia. Pain and twisting from upper chest and up with triggers "wind, creamy food" on TID ativan per psychiatry previously. "  Patient now with intermittent issues of this over the last year despite changes in psychiatric medications.  She has been off Zyprexa for some time now.  She is only on Wellbutrin, trazodone, gabapentin.  She is followed by Dr.  -we started on sinemet without improvement 0.5 tablets twice daily- will increase to full tablet -Per Dr. Donell Beers- Also started on ativan half mg in Am and 1 mg at night for dystonia- has not helped much  A/P: Ongoing issues with extreme neck tension/head motion.  No significant improvement on Sinemet per me or Ativan per psychiatry.  We will increase Sinemet dose to full tablet twice daily instead of half tablet. - scheduled with Dr. Donell Beers next week-hoping for some aid with patient who seems to be significantly uncomfortable with this   #Delayed gastric emptying-follows with Dr. Terrace Arabia S: severe nausea issues. Had GI workup. Patient states she stopped hydrocodone 5/325 once per day with concern of gastroparesis with ongoing nausea issues. She is eating 6 small meals a day and that is helping.   01/02/20 biopsies on EGD normal- no H. PYlori.   Gastric emptying study 01/23/20  "27% emptied at 1 hr ( normal >= 10%) 34% emptied at 2 hr ( normal >= 40%) 48% emptied at 3 hr ( normal >= 70%) 59% emptied at 4 hr ( normal >= 90%)  IMPRESSION: Delayed gastric emptying study." A/P: Appears to be improving with smaller more frequent meals as well as coming off of hydrocodone.  Has close follow-up with Dr. 03/24/20 planned   #Atrial  fibrillation S:Compliant with Xarelto for anticoagulation.  Flecainide as an antiarrhythmic.  Does not follow with cardiology regularly A/P: Appropriately anticoagulated-continue current medication.  No rate control medication-continue flecainide as antiarrhythmic   #Chronic pain syndrome S:On disability due to chronic pain syndrome predating being cared for at [facility]. Extensive prior orthopedic workup. issues mainly in low back and hips.   April 2021 Stopped hydrocodone 5/325 once per day-prior would take  full tablet once or splits and takes twice a day. She did not note significant increase in pain though dystonia is painful.    On gabapentin at bedtime from psychiatry- not sure this helps with pain.  -Pain worsened with PT A/P: Thankful patient has not had more significant pain issues despite  being off of hydrocodone.  She will continue gabapentin through psychiatry.  We will continue to monitor off medications for now  #Major depressive disorder S: currently on wellbutrin alone. working with psychiatry Dr. Casimiro Needle. Previously On Wellbutrin 300mg , gabepentin, trazodone 300 before bed  Per Dr. Casimiro Needle- Also started on ativan half mg in Am and 1 mg at night for dystonia- has not helped much A/P: PHQ9 right above despite dystonia issues-I would consider this reasonable control-continue current medications and psychiatry follow-up.  Thankfully no suicidal ideation-has history of suicide attempt   #Hyperlipidemia/aortic atherosclerosis- incidental finding on 11/04/19 CT S:compliant atorvastatin 20mg . LDL goal at least under 100 prefer under 70. She wants to work on lifestyle as much as able A/P: Poor control-we can recheck at next physical.  She wants to continue to work on Designer, television/film set exercise-though those are difficult with current distress with dystonia   #GERD S:compliant with omeprazole (tried to come off and had to restart). She is on b12 and folate in regards to PPI risk of  lowering these.   A/P: Good control- recommended pepcid trial once dystonia improved   Recommended follow up: Recommended 21-month follow-up Future Appointments  Date Time Provider [place]  02/14/2020  2:00 PM Marcial Pacas, MD [facility] None  03/07/2020  2:10 PM Milus Banister, MD [facility]    Lab/Order associations:   ICD-10-CM   1. Paroxysmal atrial fibrillation (HCC)  I48.0   2. Gastroesophageal reflux disease without esophagitis  K21.9   3. Hyperlipidemia, unspecified hyperlipidemia type  E78.5   4. Recurrent major depressive disorder, in full remission ([place])  F33.42   5. Aortic atherosclerosis (HCC)  I70.0   6. Dystonia  G24.9   7. Delayed gastric emptying  K30   8. Chronic pain syndrome  G89.4     Meds ordered this encounter  Medications  . carbidopa-levodopa (SINEMET) 25-100 MG tablet    Sig: Take 1 tablet by mouth 2 (two) times daily.    Dispense:  60 tablet    Refill:  1   Return precautions advised.  Garret Reddish, MD

## 2020-02-07 NOTE — Assessment & Plan Note (Signed)
S: severe nausea issues. Had GI workup. Patient states she stopped hydrocodone 5/325 once per day with concern of gastroparesis with ongoing nausea issues. She is eating 6 small meals a day and that is helping.   01/02/20 biopsies on EGD normal- no H. PYlori.   Gastric emptying study 01/23/20  "27% emptied at 1 hr ( normal >= 10%) 34% emptied at 2 hr ( normal >= 40%) 48% emptied at 3 hr ( normal >= 70%) 59% emptied at 4 hr ( normal >= 90%)  IMPRESSION: Delayed gastric emptying study." A/P: Appears to be improving with smaller more frequent meals as well as coming off of hydrocodone.  Has close follow-up with Dr. Christella Hartigan planned

## 2020-02-14 ENCOUNTER — Other Ambulatory Visit: Payer: Self-pay

## 2020-02-14 ENCOUNTER — Ambulatory Visit: Payer: Medicare HMO | Admitting: Neurology

## 2020-02-14 ENCOUNTER — Encounter: Payer: Self-pay | Admitting: Neurology

## 2020-02-14 VITALS — BP 120/75 | HR 79 | Ht 67.5 in | Wt 159.0 lb

## 2020-02-14 DIAGNOSIS — F418 Other specified anxiety disorders: Secondary | ICD-10-CM | POA: Insufficient documentation

## 2020-02-14 DIAGNOSIS — R259 Unspecified abnormal involuntary movements: Secondary | ICD-10-CM | POA: Diagnosis not present

## 2020-02-14 DIAGNOSIS — R69 Illness, unspecified: Secondary | ICD-10-CM | POA: Diagnosis not present

## 2020-02-14 DIAGNOSIS — R799 Abnormal finding of blood chemistry, unspecified: Secondary | ICD-10-CM | POA: Diagnosis not present

## 2020-02-14 MED ORDER — PROPRANOLOL HCL ER 60 MG PO CP24: 60.0000 mg | ORAL_CAPSULE | Freq: Every day | ORAL | 6 refills | Status: DC

## 2020-02-14 MED ORDER — SERTRALINE HCL 50 MG PO TABS: 50.0000 mg | ORAL_TABLET | Freq: Every day | ORAL | 6 refills | Status: DC

## 2020-02-14 NOTE — Progress Notes (Signed)
PATIENT: Danielle Rocha DOB: 03/13/1951  Chief Complaint  Patient presents with  . Dystonia    Eval for dystonia. Noticed symptoms since 12/05/19.  Marland Kitchen PCP    Shelva Majestic, MD - referring provider  . Psychiatry    Archer Asa, MD     HISTORICAL  Danielle Rocha is a 69 year old female, seen in request by her primary care physician Dr. Durene Cal, Aldine Contes, and her psychiatrist Archer Asa, for evaluation of uncontrollable involuntary movement initial evaluation was on Feb 14, 2020.  I have reviewed and summarized the referring note from the referring physician.  She had long history of depression anxiety, currently taking Wellbutrin 300 mg every morning, trazodone 300 mg every night, continue complains of excessive stress at home, also suboptimal control of her depression anxiety symptoms, she has 3 sons, she lost contact with her elder son for 5 years, her third son has Down syndrome, lives with her and her husband at home, she also had a history of atrial flutter, is on chronic anticoagulation Xarelto treatment, and also flecainide 100 mg twice a day  She moved to [place] from [place] about 6 years ago, around 2015, during the process of making decision of moving down and moving, she began to develop uncontrollable neck flexion extension, similar to what she is experienced now, initial episode last about 6 to 12 months, she was evaluated by neurologist at [facility], was given the diagnosis of psychiatric movement disorder, she was started on Ativan as needed, which was helpful,  Her symptoms improved after 1 year, at her past, she has no abnormal constant neck nodding movement at all,  She complains of increased distress recently, lost the contact with her elder son for 5 years, now per patient, his name was moved out of the lost and find the list, this has caused a lot of stress for her, she began to notice recurrent uncontrollable almost constant neck flexion  and extension movement since the end of 2020, on today's visit, she has variable degree of neck nodding movement, frequent leg moving back and forth during interview, during neurological examination, she showed variable effort, tends to hold up her shoulders  She also complains of constant neck, bilateral shoulder tension, she was given a trial of Sinemet 25/100 mg twice a day over the past 2 months there was no improvement of her symptoms.   Laboratory evaluation seen February 2021, normal TSH, CMP, CBC with hemoglobin of 15.3, LDL was 104, B12 was normal 905 in February 2020   REVIEW OF SYSTEMS: Full 14 system review of systems performed and notable only for as above All other review of systems were negative.  ALLERGIES: Allergies  Allergen Reactions  . Reglan [Metoclopramide] Other (See Comments)    Suicidal thoughts  . Ciprofloxacin Diarrhea  . Clindamycin/Lincomycin Diarrhea  . Vibramycin [Doxycycline] Diarrhea  . Macrobid [Nitrofurantoin Monohyd Macro] Rash    Rash on lip  . Sulfa Antibiotics Rash    HOME MEDICATIONS: Current Outpatient Medications  Medication Sig Dispense Refill  . acetaminophen (TYLENOL) 500 MG tablet Take 1,000 mg by mouth every 6 (six) hours as needed for mild pain, moderate pain, fever or headache.    Marland Kitchen atorvastatin (LIPITOR) 20 MG tablet Take 1 tablet (20 mg total) by mouth daily. 90 tablet 3  . buPROPion (WELLBUTRIN XL) 300 MG 24 hr tablet Take 300 mg by mouth every morning.    . carbidopa-levodopa (SINEMET) 25-100 MG tablet Take 1 tablet by mouth 2 (  two) times daily. 60 tablet 1  . cephALEXin (KEFLEX) 250 MG capsule     . cholecalciferol (VITAMIN D) 1000 units tablet Take 1,000 Units by mouth daily.    Marland Kitchen co-enzyme Q-10 30 MG capsule Take 30 mg by mouth daily.    . Cyanocobalamin (B-12 PO) Take by mouth.    Lyndle Herrlich SULFATE PO Take by mouth.    . flecainide (TAMBOCOR) 100 MG tablet Take 1 tablet (100 mg total) by mouth 2 (two) times daily. 180 tablet  3  . gabapentin (NEURONTIN) 300 MG capsule Take 900 mg by mouth at bedtime.     Marland Kitchen LORazepam (ATIVAN) 0.5 MG tablet Take 1 mg by mouth at bedtime. Takes 1 in the am and 2 at night    . Melatonin 3 MG TABS Take 2 tablets by mouth at bedtime.     . Multiple Vitamin (MULTIVITAMIN WITH MINERALS) TABS tablet Take 1 tablet by mouth daily.    Marland Kitchen omeprazole (PRILOSEC) 20 MG capsule Take 1 capsule (20 mg total) by mouth 2 (two) times daily before a meal. 180 capsule 3  . ondansetron (ZOFRAN-ODT) 8 MG disintegrating tablet DISSOLVE 1 TABLET IN MOUTH EVERY 8 HOURS AS NEEDED FOR NAUSEA FOR VOMITING 30 tablet 0  . rivaroxaban (XARELTO) 20 MG TABS tablet TAKE 1 TABLET BY MOUTH ONCE DAILY WITH  SUPPER 90 tablet 3  . traZODone (DESYREL) 150 MG tablet Take 300 mg by mouth at bedtime.     No current facility-administered medications for this visit.    PAST MEDICAL HISTORY: Past Medical History:  Diagnosis Date  . Arthritis    DJD, low back, thumb  . Atrial fibrillation (HCC)    Xarelto anticoagulation. Flecainide antiarrythmic   . Chicken pox   . Chronic pain syndrome    On disability. History of bilateral hip pain, low back pain. Gabapentin 400 BID, percocet 1 tablet daily per prior provider.   . Colon polyp    awaiting records  . Depression    zoloft 100mg , remeron 30mg  per psychiatry. ambien 10mg  per psychiatry to help wtih sleep element.   . Dystonia    described as psychogenic dystonia. Pain and twisting from upper chest and up with triggers "wind, creamy food" on TID ativan per psychiatry previously.   . Fibromyalgia   . GERD (gastroesophageal reflux disease)    omeprazole OTC  . Goiter    states multiple imaging tests, has had biopsies  . Hyperlipidemia    lovastatin 20mg   . Stroke (HCC)    TIA (left side of face and body decreased sensitivity than right face and side)  . TIA (transient ischemic attack)   . Vaginal atrophy    estrace vaginal cream    PAST SURGICAL HISTORY: Past  Surgical History:  Procedure Laterality Date  . ATRIAL FIBRILLATION ABLATION    . BIOPSY THYROID    . fusion l4-l5  2000  . LAMINECTOMY     L4-L5  . OTHER SURGICAL HISTORY     tennis elbow surgery  . piriformis release  1999  . TONSILLECTOMY AND ADENOIDECTOMY  age 79  . VAGINAL HYSTERECTOMY  1993    FAMILY HISTORY: Family History  Problem Relation Age of Onset  . Alcohol abuse Mother   . Hypertension Father   . Alcohol abuse Father   . Pancreatic cancer Father   . Cancer Brother        spindle cell RLE  . Diabetes Brother   . Cancer Brother  tonsil cancer  . Skin cancer Brother   . Colon cancer Neg Hx   . Colon polyps Neg Hx     SOCIAL HISTORY: Social History   Socioeconomic History  . Marital status: Married    Spouse name: Not on file  . Number of children: Not on file  . Years of education: Not on file  . Highest education level: Not on file  Occupational History  . Not on file  Tobacco Use  . Smoking status: Never Smoker  . Smokeless tobacco: Never Used  Substance and Sexual Activity  . Alcohol use: No    Alcohol/week: 0.0 standard drinks  . Drug use: No  . Sexual activity: Never    Partners: Male  Other Topics Concern  . Not on file  Social History Narrative   Family: Married. Husband works as Hospital doctor for [employer]. 2 children from previous marriage 1 from current. Son Jeannett Senior lives with them and has down's syndrome.       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 Moved to [facility] from [phone] and became disabled in that time frame due to hip/back issues.       Hobbies: time with son   Social Determinants of Corporate investment banker Strain:   . Difficulty of Paying Living Expenses:   Food Insecurity:   . Worried About Programme researcher, broadcasting/film/video in the Last Year:   . Barista in the Last Year:   Transportation Needs:   . Freight forwarder (Medical):   Marland Kitchen Lack of Transportation (Non-Medical):   Physical  Activity:   . Days of Exercise per Week:   . Minutes of Exercise per Session:   Stress:   . Feeling of Stress :   Social Connections:   . Frequency of Communication with Friends and Family:   . Frequency of Social Gatherings with Friends and Family:   . Attends Religious Services:   . Active Member of Clubs or Organizations:   . Attends Banker Meetings:   Marland Kitchen Marital Status:   Intimate Partner Violence:   . Fear of Current or Ex-Partner:   . Emotionally Abused:   Marland Kitchen Physically Abused:   . Sexually Abused:      PHYSICAL EXAM   Vitals:   02/14/20 1351  BP: 120/75  Pulse: 79  Weight: 159 lb (72.1 kg)  Height: 5' 7.5" (1.715 m)    Not recorded      Body mass index is 24.54 kg/m.  PHYSICAL EXAMNIATION:  Gen: NAD, conversant, well nourised, well groomed                     Cardiovascular: Regular rate rhythm, no peripheral edema, warm, nontender. Eyes: Conjunctivae clear without exudates or hemorrhage Neck: Supple, no carotid bruits. Pulmonary: Clear to auscultation bilaterally   NEUROLOGICAL EXAM: She has constant moderate amplitude neck flexion extension movement, tends to shrug her shoulders, frequent fidgeting movement of bilateral lower extremity,  MENTAL STATUS: Speech:    Speech is normal; fluent and spontaneous with normal comprehension.  Cognition:     Orientation to time, place and person     Normal recent and remote memory     Normal Attention span and concentration     Normal Language, naming, repeating,spontaneous speech     Fund of knowledge   CRANIAL NERVES: CN II: Visual fields are full to confrontation. Pupils are round equal and briskly reactive to light. CN III, IV,  VI: extraocular movement are normal. No ptosis. CN V: Facial sensation is intact to light touch CN VII: Face is symmetric with normal eye closure  CN VIII: Hearing is normal to causal conversation. CN IX, X: Phonation is normal. CN XI: Head turning and shoulder shrug  are intact  MOTOR: Variable effort on examinations, muscle strength is normal.   she has no rigidity, bradykinesia  REFLEXES: Reflexes are 2+ and symmetric at the biceps, triceps, knees, and ankles. Plantar responses are flexor.  SENSORY: Intact to light touch, pinprick and vibratory sensation are intact in fingers and toes.  COORDINATION: There is no trunk or limb dysmetria noted.  GAIT/STANCE: Steady, but continue has constant neck flexion extension movement, holding her bilateral shoulder up while ambulating,  DIAGNOSTIC DATA (LABS, IMAGING, TESTING) - I reviewed patient records, labs, notes, testing and imaging myself where available.   ASSESSMENT AND PLAN  Tasharra Nodine is a 69 y.o. female    Recurrent episode of head titubation, Depression anxiety  She has variable effort on examinations,  Was previously diagnosed with psychogenic movement disorder at [facility], she was started on Ativan 0.5 mg 0.5/1.0 mg, which has helped her symptoms some,  Laboratory evaluation including CPK, inflammatory markers, ceruloplasmin levels  She reported suboptimal control of her mood disorder, despite taking current medications of lamotrigine 100 mg every night, Wellbutrin 300 mg daily, and trazodone 300 mg every night, will add on low-dose of Zoloft 50 mg daily,  For her almost constant recurrent head titubation, will try low-dose Inderal LA 60 mg twice a day  Of brain, cervical spine to rule out structural lesion  Levert Feinstein, M.D. Ph.D.  [facility] [address], [address] [place], [place] [address] Ph: [phone] Fax: [phone]  CC: Shelva Majestic, MD, Archer Asa, MD

## 2020-02-15 ENCOUNTER — Telehealth: Payer: Self-pay | Admitting: Neurology

## 2020-02-15 NOTE — Telephone Encounter (Signed)
[employer] medicare order sent to GI. They will obtain the auth and reach out to the patient to schedule.  °

## 2020-02-15 NOTE — Telephone Encounter (Signed)
I spoke to the patient and she verbalized understanding of the lab results. She is in agreement to follow up with her PCP.

## 2020-02-15 NOTE — Telephone Encounter (Signed)
Please call patient, laboratory evaluation showed positive ANA, SSA antibody with low titer 1.2  Sometimes above mild abnormal laboratory evaluation could indicate no significant abnormality, occasionally above an abnormality could due to autoimmune disease Sjogren's disease, which usually presented with dry dry mouth, I have forward laboratory evaluation to her primary care, she may continue follow-up with Dr. Tana Conch  Rest of the laboratory evaluation showed no significant abnormalities.

## 2020-02-16 LAB — CERULOPLASMIN: Ceruloplasmin: 23 mg/dL (ref 19.0–39.0)

## 2020-02-16 LAB — ANA W/REFLEX IF POSITIVE
Anti JO-1: <0.2 AI (ref 0.0–0.9)
Anti Nuclear Antibody (ANA): POSITIVE — AB
Centromere Ab Screen: <0.2 AI (ref 0.0–0.9)
Chromatin Ab SerPl-aCnc: <0.2 AI (ref 0.0–0.9)
ENA RNP Ab: 0.3 AI (ref 0.0–0.9)
ENA SM Ab Ser-aCnc: <0.2 AI (ref 0.0–0.9)
ENA SSA (RO) Ab: 1.2 AI — ABNORMAL HIGH (ref 0.0–0.9)
ENA SSB (LA) Ab: <0.2 AI (ref 0.0–0.9)
Scleroderma (Scl-70) (ENA) Antibody, IgG: <0.2 AI (ref 0.0–0.9)
dsDNA Ab: 1 IU/mL (ref 0–9)

## 2020-02-16 LAB — C-REACTIVE PROTEIN: CRP: <1 mg/L (ref 0–10)

## 2020-02-16 LAB — COPPER, SERUM: Copper: 115 ug/dL (ref 80–158)

## 2020-02-16 LAB — CK: Total CK: 116 U/L (ref 32–182)

## 2020-02-16 LAB — FERRITIN: Ferritin: 63 ng/mL (ref 15–150)

## 2020-02-16 LAB — SEDIMENTATION RATE: Sed Rate: 6 mm/hr (ref 0–40)

## 2020-02-16 LAB — RPR: RPR Ser Ql: NONREACTIVE

## 2020-02-21 ENCOUNTER — Other Ambulatory Visit: Payer: Medicare HMO

## 2020-02-21 ENCOUNTER — Other Ambulatory Visit: Payer: Self-pay | Admitting: Family Medicine

## 2020-02-21 DIAGNOSIS — R69 Illness, unspecified: Secondary | ICD-10-CM | POA: Diagnosis not present

## 2020-02-22 NOTE — Telephone Encounter (Signed)
Please let patient know that her insurance would not approve her MRIs.

## 2020-02-22 NOTE — Telephone Encounter (Signed)
Noted, thank you

## 2020-02-22 NOTE — Telephone Encounter (Signed)
[employer] medicare did not approve the MRI's.  "Based on medicare guidelines movement disorders, we cannot approve this request. Your records show that you have a problem that affects your body movement,. The reason this request cannot be approved is because 1. It is only supported when needed to confirm your diagnosis or differentiate between two types of movement disorders (parkinson's disease or essential tremor) 2. Your records do not show that your diagnosis (the cause of your problem) remains unclear after being evaluated by a neurologist.   Based on the guidelines for evicore head. Movement disorders, we cannot approve this request. Your records show that you have a problem that affects your body movement. The reason this request cannot be approved is because 1. It is only supported when needed to confirm your diagnosis.   There is an option to do a peer to peer. The phone number is [phone] option 4. The case number is [id number]. It does need to be scheduled by February 25, 2020.

## 2020-02-22 NOTE — Telephone Encounter (Signed)
I have called to notify the patient. She will continue the prescribed medication and keep her pending follow up.

## 2020-02-29 DIAGNOSIS — R69 Illness, unspecified: Secondary | ICD-10-CM | POA: Diagnosis not present

## 2020-03-07 ENCOUNTER — Ambulatory Visit: Payer: Medicare HMO | Admitting: Gastroenterology

## 2020-03-07 ENCOUNTER — Telehealth: Payer: Self-pay | Admitting: Gastroenterology

## 2020-03-07 ENCOUNTER — Encounter: Payer: Self-pay | Admitting: Gastroenterology

## 2020-03-07 VITALS — BP 100/62 | HR 62 | Ht 67.0 in | Wt 160.1 lb

## 2020-03-07 DIAGNOSIS — K5900 Constipation, unspecified: Secondary | ICD-10-CM

## 2020-03-07 DIAGNOSIS — R112 Nausea with vomiting, unspecified: Secondary | ICD-10-CM

## 2020-03-07 NOTE — Telephone Encounter (Signed)
Pt inquired whether she should continue ondansetron.

## 2020-03-07 NOTE — Progress Notes (Signed)
Review of pertinent gastrointestinal problems: 1.  Nausea, likely from gastroparesis.  [place] 11/2019 suggested fatty liver but was otherwise normal.  EGD April 2021 Dr. Ardis Hughs found mild nonspecific gastritis.  Pathology showed no H. pylori.  Follow-up gastric emptying scan 01/2020 was abnormal, positive for gastroparesis.  2.  Routine risk for colon cancer colonoscopy April 2021 Dr. Ardis Hughs, screening for colon cancer found diverticulosis and hemorrhoids.  She was recommended to have repeat colonoscopy at 10-year interval for routine risk screening.   HPI: This is a very pleasant 69 year old woman whom I last saw about 2 months ago the time of colonoscopy and upper endoscopy.  See those results summarized above  She saw a neurologist last month and is undergoing work-up for dystonia, involuntary movements.  Apparently she was seen previously at [facility] and was given the diagnosis of "psychiatric movement disorder".  A battery of blood tests was essentially normal, she has MRI of brain and spine pending.  Today she tells me she is still bothered by nausea.  She stopped taking narcotic pain medicines 2 or 3 months ago.  She does not have vomiting.  Also she mentioned significant chronic constipation.  She will move her bowels only once every week or so and that takes quite a lot of effort when it does come around.  She does not have any overt GI bleeding.  She has had this problem of chronic constipation for many years at least.  She tried Colace in the past with limited results.  She has tried fiber supplements in the very distant past and cannot remember if they helped or not.  I do not think she is ever tried MiraLAX or any of the newer oral pills.  Her weight is overall stable  ROS: complete GI ROS as described in HPI, all other review negative.  Constitutional:  No unintentional weight loss   Past Medical History:  Diagnosis Date  . Arthritis    DJD, low back, thumb  . Atrial fibrillation  (HCC)    Xarelto anticoagulation. Flecainide antiarrythmic   . Chicken pox   . Chronic pain syndrome    On disability. History of bilateral hip pain, low back pain. Gabapentin 400 BID, percocet 1 tablet daily per prior provider.   . Colon polyp    awaiting records  . Depression    zoloft 100mg , remeron 30mg  per psychiatry. ambien 10mg  per psychiatry to help wtih sleep element.   . Dystonia    described as psychogenic dystonia. Pain and twisting from upper chest and up with triggers "wind, creamy food" on TID ativan per psychiatry previously.   . Fibromyalgia   . GERD (gastroesophageal reflux disease)    omeprazole OTC  . Goiter    states multiple imaging tests, has had biopsies  . Hyperlipidemia    lovastatin 20mg   . Stroke [facility])    TIA (left side of face and body decreased sensitivity than right face and side)  . TIA (transient ischemic attack)   . Vaginal atrophy    estrace vaginal cream    Past Surgical History:  Procedure Laterality Date  . ATRIAL FIBRILLATION ABLATION    . BIOPSY THYROID    . fusion l4-l5  2000  . LAMINECTOMY     L4-L5  . OTHER SURGICAL HISTORY     tennis elbow surgery  . piriformis release  1999  . TONSILLECTOMY AND ADENOIDECTOMY  age 59  . VAGINAL HYSTERECTOMY  1993    Current Outpatient Medications  Medication Sig  Dispense Refill  . acetaminophen (TYLENOL) 500 MG tablet Take 1,000 mg by mouth every 6 (six) hours as needed for mild pain, moderate pain, fever or headache.    Marland Kitchen atorvastatin (LIPITOR) 20 MG tablet Take 1 tablet (20 mg total) by mouth daily. 90 tablet 3  . buPROPion (WELLBUTRIN XL) 300 MG 24 hr tablet Take 300 mg by mouth every morning.    . cephALEXin (KEFLEX) 250 MG capsule     . cholecalciferol (VITAMIN D) 1000 units tablet Take 1,000 Units by mouth daily.    Marland Kitchen co-enzyme Q-10 30 MG capsule Take 30 mg by mouth daily.    . Cyanocobalamin (B-12 PO) Take by mouth.    Di Kindle SULFATE PO Take by mouth.    . flecainide (TAMBOCOR) 100  MG tablet Take 1 tablet by mouth twice daily 180 tablet 0  . gabapentin (NEURONTIN) 300 MG capsule Take 300 mg by mouth at bedtime.     Marland Kitchen LORazepam (ATIVAN) 0.5 MG tablet Take 1 mg by mouth at bedtime. Takes 1 in the am and 2 at night    . Melatonin 3 MG TABS Take 2 tablets by mouth at bedtime.     . Multiple Vitamin (MULTIVITAMIN WITH MINERALS) TABS tablet Take 1 tablet by mouth daily.    Marland Kitchen omeprazole (PRILOSEC) 20 MG capsule Take 1 capsule (20 mg total) by mouth 2 (two) times daily before a meal. 180 capsule 3  . ondansetron (ZOFRAN-ODT) 8 MG disintegrating tablet DISSOLVE 1 TABLET IN MOUTH EVERY 8 HOURS AS NEEDED FOR NAUSEA FOR VOMITING 30 tablet 0  . propranolol ER (INDERAL LA) 60 MG 24 hr capsule Take 1 capsule (60 mg total) by mouth daily. 60 capsule 6  . rivaroxaban (XARELTO) 20 MG TABS tablet TAKE 1 TABLET BY MOUTH ONCE DAILY WITH  SUPPER 90 tablet 3  . sertraline (ZOLOFT) 50 MG tablet Take 1 tablet (50 mg total) by mouth daily. 30 tablet 6  . traZODone (DESYREL) 150 MG tablet Take 150 mg by mouth at bedtime.      No current facility-administered medications for this visit.    Allergies as of 03/07/2020 - Review Complete 03/07/2020  Allergen Reaction Noted  . Reglan [metoclopramide] Other (See Comments) 01/18/2015  . Ciprofloxacin Diarrhea 01/18/2015  . Clindamycin/lincomycin Diarrhea 01/18/2015  . Vibramycin [doxycycline] Diarrhea 01/18/2015  . Macrobid [nitrofurantoin monohyd macro] Rash 11/29/2015  . Sulfa antibiotics Rash 06/06/2015    Family History  Problem Relation Age of Onset  . Alcohol abuse Mother   . Hypertension Father   . Alcohol abuse Father   . Pancreatic cancer Father   . Cancer Brother        spindle cell RLE  . Diabetes Brother   . Cancer Brother        tonsil cancer  . Skin cancer Brother   . Colon cancer Neg Hx   . Colon polyps Neg Hx     Social History   Socioeconomic History  . Marital status: Married    Spouse name: Not on file  . Number  of children: Not on file  . Years of education: Not on file  . Highest education level: Not on file  Occupational History  . Not on file  Tobacco Use  . Smoking status: Never Smoker  . Smokeless tobacco: Never Used  Vaping Use  . Vaping Use: Never used  Substance and Sexual Activity  . Alcohol use: No    Alcohol/week: 0.0 standard drinks  . Drug use:  No  . Sexual activity: Never    Partners: Male  Other Topics Concern  . Not on file  Social History Narrative   Family: Married. Husband works as Hospital doctor for [employer]. 2 children from previous marriage 1 from current. Son Jeannett Senior lives with them and has down's syndrome.       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 Moved to [facility] from [address] and became disabled in that time frame due to hip/back issues.       Hobbies: time with son   Social Determinants of Corporate investment banker Strain:   . Difficulty of Paying Living Expenses:   Food Insecurity:   . Worried About Programme researcher, broadcasting/film/video in the Last Year:   . Barista in the Last Year:   Transportation Needs:   . Freight forwarder (Medical):   Marland Kitchen Lack of Transportation (Non-Medical):   Physical Activity:   . Days of Exercise per Week:   . Minutes of Exercise per Session:   Stress:   . Feeling of Stress :   Social Connections:   . Frequency of Communication with Friends and Family:   . Frequency of Social Gatherings with Friends and Family:   . Attends Religious Services:   . Active Member of Clubs or Organizations:   . Attends Banker Meetings:   Marland Kitchen Marital Status:   Intimate Partner Violence:   . Fear of Current or Ex-Partner:   . Emotionally Abused:   Marland Kitchen Physically Abused:   . Sexually Abused:      Physical Exam: BP 100/62   Pulse 62   Ht 5\' 7"  (1.702 m)   Wt 160 lb 2 oz (72.6 kg)   LMP  (LMP Unknown)   BMI 25.08 kg/m  Constitutional: generally well-appearing Psychiatric: alert and oriented  x3 Abdomen: soft, nontender, nondistended, no obvious ascites, no peritoneal signs, normal bowel sounds No peripheral edema noted in lower extremities  Assessment and plan: 69 y.o. female with gastroparesis, chronic constipation  I am very reluctant to offer her Reglan given her recent dystonic movements.  Instead I asked her to focus on small frequent meals 5 or 6 meals a day in fact.  I wonder if her chronic constipation is playing somewhat of a role in her nausea.  Certainly a lot of people would feel quite nauseous if they only had a bowel movement every week or so.  Her constipation has been an issue of hers for many years.  Colonoscopy 2 months ago showed no clear etiology.  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  I do believe that she stopped taking opiates 2 or 3 months ago fortunately.  I recommended a trial of daily over-the-counter fiber supplements and she will call to report on her response in 5 or 6 weeks.  If she is not finding relief then I would likely change to MiraLAX at that point.  Please see the "Patient Instructions" section for addition details about the plan.  80, MD [facility] 03/07/2020, 2:15 PM   Total time on date of encounter was 30 minutes (this included time spent preparing to see the patient reviewing records; obtaining and/or reviewing separately obtained history; performing a medically appropriate exam and/or evaluation; counseling and educating the patient and family if present; ordering medications, tests or procedures if applicable; and documenting clinical information in the health record).

## 2020-03-07 NOTE — Telephone Encounter (Signed)
Patient advised that since ondansetron does not seem to be working at this time, she should discontinue per Dr Christella Hartigan.  Patient agreed to plan and verbalized understanding.  No further questions.

## 2020-03-07 NOTE — Patient Instructions (Addendum)
If you are age 68 or older, your body mass index should be between 23-30. Your Body mass index is 25.08 kg/m. If this is out of the aforementioned range listed, please consider follow up with your Primary Care Provider.  If you are age 87 or younger, your body mass index should be between 19-25. Your Body mass index is 25.08 kg/m. If this is out of the aformentioned range listed, please consider follow up with your Primary Care Provider.   Please purchase the following medications over the counter and take as directed:  START: citrucel (orange flavored) powder fiber supplement.  This may cause some bloating at first but that usually goes away. Begin with a small spoonful and work your way up to a large, heaping spoonful daily over a week.  You should be eating 5-6 small meals each day.  Please call the office at [phone] in 4- 5 weeks to let us know how you are feeling.  Thank you for entrusting me with your care and choosing [facility].  Dr Christella Hartigan

## 2020-03-07 NOTE — Telephone Encounter (Signed)
It does not seem like it is helping and so I think she should stop taking it.

## 2020-03-11 ENCOUNTER — Other Ambulatory Visit: Payer: Self-pay | Admitting: Family Medicine

## 2020-03-28 DIAGNOSIS — R69 Illness, unspecified: Secondary | ICD-10-CM | POA: Diagnosis not present

## 2020-04-18 ENCOUNTER — Telehealth: Payer: Self-pay | Admitting: Gastroenterology

## 2020-04-18 NOTE — Telephone Encounter (Signed)
The pt was asked to call with an update and she states she doing very well with Citrucel and has very rare nausea.  She will call back if her symptoms return.

## 2020-05-02 DIAGNOSIS — R69 Illness, unspecified: Secondary | ICD-10-CM | POA: Diagnosis not present

## 2020-05-03 DIAGNOSIS — R69 Illness, unspecified: Secondary | ICD-10-CM | POA: Diagnosis not present

## 2020-05-16 ENCOUNTER — Other Ambulatory Visit: Payer: Self-pay | Admitting: Family Medicine

## 2020-05-17 ENCOUNTER — Ambulatory Visit: Payer: Medicare HMO | Admitting: Neurology

## 2020-05-21 ENCOUNTER — Other Ambulatory Visit: Payer: Self-pay | Admitting: Family Medicine

## 2020-05-31 DIAGNOSIS — R69 Illness, unspecified: Secondary | ICD-10-CM | POA: Diagnosis not present

## 2020-06-04 ENCOUNTER — Other Ambulatory Visit: Payer: Self-pay | Admitting: Family Medicine

## 2020-06-15 DIAGNOSIS — R351 Nocturia: Secondary | ICD-10-CM | POA: Diagnosis not present

## 2020-06-15 DIAGNOSIS — N390 Urinary tract infection, site not specified: Secondary | ICD-10-CM | POA: Diagnosis not present

## 2020-06-19 ENCOUNTER — Other Ambulatory Visit: Payer: Self-pay | Admitting: Family Medicine

## 2020-06-26 ENCOUNTER — Other Ambulatory Visit: Payer: Self-pay | Admitting: Family Medicine

## 2020-06-28 DIAGNOSIS — R69 Illness, unspecified: Secondary | ICD-10-CM | POA: Diagnosis not present

## 2020-07-02 DIAGNOSIS — D6869 Other thrombophilia: Secondary | ICD-10-CM | POA: Diagnosis not present

## 2020-07-02 DIAGNOSIS — I4892 Unspecified atrial flutter: Secondary | ICD-10-CM | POA: Diagnosis not present

## 2020-07-02 DIAGNOSIS — I10 Essential (primary) hypertension: Secondary | ICD-10-CM | POA: Diagnosis not present

## 2020-07-02 DIAGNOSIS — G4700 Insomnia, unspecified: Secondary | ICD-10-CM | POA: Diagnosis not present

## 2020-07-02 DIAGNOSIS — G2581 Restless legs syndrome: Secondary | ICD-10-CM | POA: Diagnosis not present

## 2020-07-02 DIAGNOSIS — G8929 Other chronic pain: Secondary | ICD-10-CM | POA: Diagnosis not present

## 2020-07-02 DIAGNOSIS — E785 Hyperlipidemia, unspecified: Secondary | ICD-10-CM | POA: Diagnosis not present

## 2020-07-02 DIAGNOSIS — R69 Illness, unspecified: Secondary | ICD-10-CM | POA: Diagnosis not present

## 2020-07-02 DIAGNOSIS — I4891 Unspecified atrial fibrillation: Secondary | ICD-10-CM | POA: Diagnosis not present

## 2020-07-03 ENCOUNTER — Ambulatory Visit: Payer: Medicare HMO | Admitting: Neurology

## 2020-07-03 ENCOUNTER — Encounter: Payer: Self-pay | Admitting: Neurology

## 2020-07-03 VITALS — BP 91/54 | HR 52 | Ht 67.0 in | Wt 158.0 lb

## 2020-07-03 DIAGNOSIS — R259 Unspecified abnormal involuntary movements: Secondary | ICD-10-CM

## 2020-07-03 DIAGNOSIS — R69 Illness, unspecified: Secondary | ICD-10-CM | POA: Diagnosis not present

## 2020-07-03 DIAGNOSIS — F418 Other specified anxiety disorders: Secondary | ICD-10-CM

## 2020-07-03 DIAGNOSIS — G249 Dystonia, unspecified: Secondary | ICD-10-CM | POA: Diagnosis not present

## 2020-07-03 MED ORDER — PROPRANOLOL HCL 20 MG PO TABS: 20.0000 mg | ORAL_TABLET | Freq: Two times a day (BID) | ORAL | 11 refills | Status: DC

## 2020-07-03 NOTE — Progress Notes (Signed)
PATIENT: Danielle Rocha DOB: [birth year]  Chief Complaint  Patient presents with  . Involuntary Movements    Reports propranolol 67m, one tablet daily has been helpful for her involuntary movements. However, she feels the medication makes her lightheaded. She notes that her BP has been running lower.       HISTORICAL  Danielle Hargreavesis a 69year old female, seen in request by her primary care physician Dr. HYong Channel SBrayton Rocha and her psychiatrist PNorma Rocha for evaluation of uncontrollable involuntary movement initial evaluation was on Feb 14, 2020.  I have reviewed and summarized the referring note from the referring physician.  She had long history of depression anxiety, currently taking Wellbutrin 300 mg every morning, trazodone 300 mg every night, continue complains of excessive stress at home, also suboptimal control of her depression anxiety symptoms, she has 3 sons, she lost contact with her elder son for 5 years, her third son has Down syndrome, lives with her and her husband at home, she also had a history of atrial flutter, is on chronic anticoagulation Xarelto treatment, and also flecainide 100 mg twice a day  [place], during the process of making decision of moving down and moving, she began to develop uncontrollable neck flexion extension, similar to what she is experienced now, initial episode last about 6 to 12 months, [facility] given the diagnosis of psychiatric movement disorder, she was started on Ativan as needed, which was helpful,  Her symptoms improved after 1 year, at her past, she has no abnormal constant neck nodding movement at all,  She complains of increased distress recently, lost the contact with her elder son for 5 years, now per patient, his name was moved out of the lost and find the list, this has caused a lot of stress for her, she began  to notice recurrent uncontrollable almost constant neck flexion and extension movement since the end of 2020, on today's visit, she has variable degree of neck nodding movement, frequent leg moving back and forth during interview, during neurological examination, she showed variable effort, tends to hold up her shoulders  She also complains of constant neck, bilateral shoulder tension, she was given a trial of Sinemet 25/100 mg twice a day over the past 2 months there was no improvement of her symptoms.   Laboratory evaluation seen February 2021, normal TSH, CMP, CBC with hemoglobin of 15.3, LDL was 104, B12 was normal 905 in February 2020  UPDATE Jul 03 2020: She is now taking propanolol LA 673mdaily, complains of stiffness of head, neck jaw,   REVIEW OF SYSTEMS: Full 14 system review of systems performed and notable only for as above All other review of systems were negative.  ALLERGIES: Allergies  Allergen Reactions  . Reglan [Metoclopramide] Other (See Comments)    Suicidal thoughts  . Ciprofloxacin Diarrhea  . Clindamycin/Lincomycin Diarrhea  . Vibramycin [Doxycycline] Diarrhea  . Macrobid [Nitrofurantoin Monohyd Macro] Rash    Rash on lip  . Sulfa Antibiotics Rash    HOME MEDICATIONS: Current Outpatient Medications  Medication Sig Dispense Refill  . acetaminophen (TYLENOL) 500 MG tablet Take 1,000 mg by mouth every 6 (six) hours as needed for mild pain, moderate pain, fever or headache.    . Marland Kitchentorvastatin (LIPITOR) 20 MG tablet Take 1 tablet by mouth once daily 90 tablet 0  . buPROPion (WELLBUTRIN XL) 300 MG 24 hr tablet  Take 300 mg by mouth every morning.    . cephALEXin (KEFLEX) 250 MG capsule Take 250 mg by mouth daily. Takes daily for frequent UTIs.    . cholecalciferol (VITAMIN D) 1000 units tablet Take 1,000 Units by mouth daily.    Marland Kitchen co-enzyme Q-10 30 MG capsule Take 30 mg by mouth daily.    . Cyanocobalamin (B-12 PO) Take by mouth.    Lyndle Herrlich SULFATE PO Take by  mouth.    . flecainide (TAMBOCOR) 100 MG tablet Take 1 tablet by mouth twice daily 180 tablet 1  . gabapentin (NEURONTIN) 300 MG capsule Take 300 mg by mouth at bedtime.     Marland Kitchen LORazepam (ATIVAN) 0.5 MG tablet Take 1 mg by mouth at bedtime. Takes 1 in the am and 2 at night    . Melatonin 3 MG TABS Take 2 tablets by mouth at bedtime.     . Multiple Vitamin (MULTIVITAMIN WITH MINERALS) TABS tablet Take 1 tablet by mouth daily.    Marland Kitchen omeprazole (PRILOSEC) 20 MG capsule TAKE 1 CAPSULE BY MOUTH TWICE DAILY BEFORE A MEAL 60 capsule 0  . propranolol ER (INDERAL LA) 60 MG 24 hr capsule Take 1 capsule (60 mg total) by mouth daily. 60 capsule 6  . sertraline (ZOLOFT) 50 MG tablet Take 1 tablet (50 mg total) by mouth daily. 30 tablet 6  . traZODone (DESYREL) 150 MG tablet Take 150 mg by mouth at bedtime.     Alveda Reasons 20 MG TABS tablet TAKE 1 TABLET BY MOUTH ONCE DAILY WITH SUPPER 90 tablet 0   No current facility-administered medications for this visit.    PAST MEDICAL HISTORY: Past Medical History:  Diagnosis Date  . Arthritis    DJD, low back, thumb  . Atrial fibrillation (HCC)    Xarelto anticoagulation. Flecainide antiarrythmic   . Chicken pox   . Chronic pain syndrome    On disability. History of bilateral hip pain, low back pain. Gabapentin 400 BID, percocet 1 tablet daily per prior provider.   . Colon polyp    awaiting records  . Depression    zoloft 171m, remeron 341mper psychiatry. ambien 1063mer psychiatry to help wtih sleep element.   . Dystonia    described as psychogenic dystonia. Pain and twisting from upper chest and up with triggers "wind, creamy food" on TID ativan per psychiatry previously.   . Fibromyalgia   . GERD (gastroesophageal reflux disease)    omeprazole OTC  . Goiter    states multiple imaging tests, has had biopsies  . Hyperlipidemia    [facility]m [facility] (left side of face and body decreased sensitivity than right face and side)  . TIA  (transient ischemic attack)   . Vaginal atrophy    estrace vaginal cream    PAST SURGICAL HISTORY: Past Surgical History:  Procedure Laterality Date  . ATRIAL FIBRILLATION ABLATION    . BIOPSY THYROID    . fusion l4-l5  2000  . LAMINECTOMY     L4-L5  . OTHER SURGICAL HISTORY     tennis elbow surgery  . piriformis release  1999  . TONSILLECTOMY AND ADENOIDECTOMY  age 60  .59VAGINAL HYSTERECTOMY  1993    FAMILY HISTORY: Family History  Problem Relation Age of Onset  . Alcohol abuse Mother   . Hypertension Father   . Alcohol abuse Father   . Pancreatic cancer Father   . Cancer Brother  spindle cell RLE  . Diabetes Brother   . Cancer Brother        tonsil cancer  . Skin cancer Brother   . Colon cancer Neg Hx   . Colon polyps Neg Hx     SOCIAL HISTORY: Social History   Socioeconomic History  . Marital status: Married    Spouse name: Not on file  . Number of children: Not on file  . Years of education: Not on file  . Highest education level: Not on file  Occupational History  . Not on file  Tobacco Use  . Smoking status: Never Smoker  . Smokeless tobacco: Never Used  Vaping Use  . Vaping Use: Never used  Substance and Sexual Activity  . Alcohol use: No    Alcohol/week: 0.0 standard drinks  . Drug use: No  . Sexual activity: Never    Partners: Male  Other Topics Concern  . Not on file  Social History Narrative   Family: Married. Husband works as Geophysicist/field seismologist for [employer]. 2 children from previous marriage 1 from current. Son [name] lives with them and has down's syndrome.       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 Moved to [facility] from [id number] and became disabled in that time frame due to hip/back issues.       Hobbies: time with son   Social Determinants of Health   Financial Resource Strain:   . Difficulty of Paying Living Expenses: Not on file  Food Insecurity:   . Worried About Charity fundraiser in the Last  Year: Not on file  . Ran Out of Food in the Last Year: Not on file  Transportation Needs:   . Lack of Transportation (Medical): Not on file  . Lack of Transportation (Non-Medical): Not on file  Physical Activity:   . Days of Exercise per Week: Not on file  . Minutes of Exercise per Session: Not on file  Stress:   . Feeling of Stress : Not on file  Social Connections:   . Frequency of Communication with Friends and Family: Not on file  . Frequency of Social Gatherings with Friends and Family: Not on file  . Attends Religious Services: Not on file  . Active Member of Clubs or Organizations: Not on file  . Attends Archivist Meetings: Not on file  . Marital Status: Not on file  Intimate Partner Violence:   . Fear of Current or Ex-Partner: Not on file  . Emotionally Abused: Not on file  . Physically Abused: Not on file  . Sexually Abused: Not on file     PHYSICAL EXAM   Vitals:   07/03/20 1019  BP: (!) 91/54  Pulse: (!) 52  Weight: 158 lb (71.7 kg)  Height: 5' 7"  (1.702 m)   Not recorded     Body mass index is 24.75 kg/m.  PHYSICAL EXAMNIATION:  Gen: NAD, conversant, well nourised, well groomed                     Cardiovascular: Regular rate rhythm, no peripheral edema, warm, nontender. Eyes: Conjunctivae clear without exudates or hemorrhage Neck: Supple, no carotid bruits. Pulmonary: Clear to auscultation bilaterally   NEUROLOGICAL EXAM: She has constant moderate amplitude neck flexion extension movement, tends to shrug her shoulders, frequent fidgeting movement of bilateral lower extremity,  MENTAL STATUS: Speech/Cognition: Awake, alert, oriented to history taking and casual conversation   CRANIAL NERVES: CN II: Visual fields are  full to confrontation. Pupils are round equal and briskly reactive to light. CN III, IV, VI: extraocular movement are normal. No ptosis. CN V: Facial sensation is intact to light touch CN VII: Face is symmetric with normal  eye closure  CN VIII: Hearing is normal to causal conversation. CN IX, X: Phonation is normal. CN XI: Head turning and shoulder shrug are intact  MOTOR: Variable effort on examinations, muscle strength is normal.   she has no rigidity, bradykinesia  REFLEXES: Reflexes are 2+ and symmetric at the biceps, triceps, knees, and ankles. Plantar responses are flexor.  SENSORY: Intact to light touch, pinprick and vibratory sensation are intact in fingers and toes.  COORDINATION: There is no trunk or limb dysmetria noted.  GAIT/STANCE: Steady, but continue has constant neck flexion extension movement, holding her bilateral shoulder up while ambulating,  DIAGNOSTIC DATA (LABS, IMAGING, TESTING) - I reviewed patient records, labs, notes, testing and imaging myself where available.   ASSESSMENT AND PLAN  Danielle Rocha is a 69 y.o. female    Recurrent episode of head titubation, Depression anxiety  She has variable effort on examinations,  Was previously diagnosed with psychogenic movement disorder at [facility], she was started on Ativan 0.5 mg 0.5/1.0 mg, which has helped her symptoms some,  Laboratory evaluation were normal, including normal ceruloplasmin, ferritin, copper, ESR, CPK, C-reactive protein, RPR, positive ANA, with positive  SSA antibody  Her symptoms has improved with Inderal LA 60 mg daily, but complains of low blood pressure, dizziness, will decrease the dose to Inderal regular preparation 20 mg twice a day  Continue follow-up with primary care physician    Marcial Pacas, M.D. Ph.D.  [facility] [address] [place], [place] [address] Ph: [phone] Fax: [phone]  CC: Marin Olp, MD, Danielle Fredrickson, MD

## 2020-07-11 ENCOUNTER — Encounter: Payer: Self-pay | Admitting: Family Medicine

## 2020-07-24 ENCOUNTER — Other Ambulatory Visit: Payer: Self-pay | Admitting: Family Medicine

## 2020-07-24 DIAGNOSIS — R69 Illness, unspecified: Secondary | ICD-10-CM | POA: Diagnosis not present

## 2020-07-31 DIAGNOSIS — R69 Illness, unspecified: Secondary | ICD-10-CM | POA: Diagnosis not present

## 2020-08-27 ENCOUNTER — Other Ambulatory Visit: Payer: Self-pay | Admitting: Family Medicine

## 2020-08-30 DIAGNOSIS — R69 Illness, unspecified: Secondary | ICD-10-CM | POA: Diagnosis not present

## 2020-09-03 DIAGNOSIS — R69 Illness, unspecified: Secondary | ICD-10-CM | POA: Diagnosis not present

## 2020-09-06 ENCOUNTER — Ambulatory Visit (INDEPENDENT_AMBULATORY_CARE_PROVIDER_SITE_OTHER): Payer: Medicare HMO

## 2020-09-06 DIAGNOSIS — Z1231 Encounter for screening mammogram for malignant neoplasm of breast: Secondary | ICD-10-CM

## 2020-09-06 DIAGNOSIS — Z0000 Encounter for general adult medical examination without abnormal findings: Secondary | ICD-10-CM | POA: Diagnosis not present

## 2020-09-06 NOTE — Patient Instructions (Addendum)
Danielle Rocha , Thank you for taking time to come for your Medicare Wellness Visit. I appreciate your ongoing commitment to your health goals. Please review the following plan we discussed and let me know if I can assist you in the future.   Screening recommendations/referrals: Colonoscopy: Done 01/02/20 Mammogram: Done 03/24/18, GI breast will contact to make appt per your schedule Bone Density: Done 10/31/15 Recommended yearly ophthalmology/optometry visit for glaucoma screening and checkup Recommended yearly dental visit for hygiene and checkup  Vaccinations: Influenza vaccine: Done 07/24/20 Pneumococcal vaccine: Up to date Tdap vaccine: Up to date Shingles vaccine: Shingrix discussed. Please contact your pharmacy for coverage information.    Covid-19:Completed 2/21 & 12/07/19  Advanced directives: Advance directive discussed with you today. I have provided a copy for you to complete at home and have notarized. Once this is complete please bring a copy in to our office so we can scan it into your chart.  Conditions/risks identified: None at this time  Next appointment: Follow up in one year for your annual wellness visit    Preventive Care 65 Years and Older, Female Preventive care refers to lifestyle choices and visits with your health care provider that can promote health and wellness. What does preventive care include?  A yearly physical exam. This is also called an annual well check.  Dental exams once or twice a year.  Routine eye exams. Ask your health care provider how often you should have your eyes checked.  Personal lifestyle choices, including:  Daily care of your teeth and gums.  Regular physical activity.  Eating a healthy diet.  Avoiding tobacco and drug use.  Limiting alcohol use.  Practicing safe sex.  Taking low-dose aspirin every day.  Taking vitamin and mineral supplements as recommended by your health care provider. What happens during an annual well  check? The services and screenings done by your health care provider during your annual well check will depend on your age, overall health, lifestyle risk factors, and family history of disease. Counseling  Your health care provider may ask you questions about your:  Alcohol use.  Tobacco use.  Drug use.  Emotional well-being.  Home and relationship well-being.  Sexual activity.  Eating habits.  History of falls.  Memory and ability to understand (cognition).  Work and work Astronomer.  Reproductive health. Screening  You may have the following tests or measurements:  Height, weight, and BMI.  Blood pressure.  Lipid and cholesterol levels. These may be checked every 5 years, or more frequently if you are over 99 years old.  Skin check.  Lung cancer screening. You may have this screening every year starting at age 68 if you have a 30-pack-year history of smoking and currently smoke or have quit within the past 15 years.  Fecal occult blood test (FOBT) of the stool. You may have this test every year starting at age 80.  Flexible sigmoidoscopy or colonoscopy. You may have a sigmoidoscopy every 5 years or a colonoscopy every 10 years starting at age 73.  Hepatitis C blood test.  Hepatitis B blood test.  Sexually transmitted disease (STD) testing.  Diabetes screening. This is done by checking your blood sugar (glucose) after you have not eaten for a while (fasting). You may have this done every 1-3 years.  Bone density scan. This is done to screen for osteoporosis. You may have this done starting at age 55.  Mammogram. This may be done every 1-2 years. Talk to your health care provider  about how often you should have regular mammograms. Talk with your health care provider about your test results, treatment options, and if necessary, the need for more tests. Vaccines  Your health care provider may recommend certain vaccines, such as:  Influenza vaccine. This is  recommended every year.  Tetanus, diphtheria, and acellular pertussis (Tdap, Td) vaccine. You may need a Td booster every 10 years.  Zoster vaccine. You may need this after age 44.  Pneumococcal 13-valent conjugate (PCV13) vaccine. One dose is recommended after age 46.  Pneumococcal polysaccharide (PPSV23) vaccine. One dose is recommended after age 2. Talk to your health care provider about which screenings and vaccines you need and how often you need them. This information is not intended to replace advice given to you by your health care provider. Make sure you discuss any questions you have with your health care provider. Document Released: 10/05/2015 Document Revised: 05/28/2016 Document Reviewed: 07/10/2015 Elsevier Interactive Patient Education  2017 [employer].  Fall Prevention in the Home Falls can cause injuries. They can happen to people of all ages. There are many things you can do to make your home safe and to help prevent falls. What can I do on the outside of my home?  Regularly fix the edges of walkways and driveways and fix any cracks.  Remove anything that might make you trip as you walk through a door, such as a raised step or threshold.  Trim any bushes or trees on the path to your home.  Use bright outdoor lighting.  Clear any walking paths of anything that might make someone trip, such as rocks or tools.  Regularly check to see if handrails are loose or broken. Make sure that both sides of any steps have handrails.  Any raised decks and porches should have guardrails on the edges.  Have any leaves, snow, or ice cleared regularly.  Use sand or salt on walking paths during winter.  Clean up any spills in your garage right away. This includes oil or grease spills. What can I do in the bathroom?  Use night lights.  Install grab bars by the toilet and in the tub and shower. Do not use towel bars as grab bars.  Use non-skid mats or decals in the tub or  shower.  If you need to sit down in the shower, use a plastic, non-slip stool.  Keep the floor dry. Clean up any water that spills on the floor as soon as it happens.  Remove soap buildup in the tub or shower regularly.  Attach bath mats securely with double-sided non-slip rug tape.  Do not have throw rugs and other things on the floor that can make you trip. What can I do in the bedroom?  Use night lights.  Make sure that you have a light by your bed that is easy to reach.  Do not use any sheets or blankets that are too big for your bed. They should not hang down onto the floor.  Have a firm chair that has side arms. You can use this for support while you get dressed.  Do not have throw rugs and other things on the floor that can make you trip. What can I do in the kitchen?  Clean up any spills right away.  Avoid walking on wet floors.  Keep items that you use a lot in easy-to-reach places.  If you need to reach something above you, use a strong step stool that has a grab bar.  Keep electrical cords out of the way.  Do not use floor polish or wax that makes floors slippery. If you must use wax, use non-skid floor wax.  Do not have throw rugs and other things on the floor that can make you trip. What can I do with my stairs?  Do not leave any items on the stairs.  Make sure that there are handrails on both sides of the stairs and use them. Fix handrails that are broken or loose. Make sure that handrails are as long as the stairways.  Check any carpeting to make sure that it is firmly attached to the stairs. Fix any carpet that is loose or worn.  Avoid having throw rugs at the top or bottom of the stairs. If you do have throw rugs, attach them to the floor with carpet tape.  Make sure that you have a light switch at the top of the stairs and the bottom of the stairs. If you do not have them, ask someone to add them for you. What else can I do to help prevent  falls?  Wear shoes that:  Do not have high heels.  Have rubber bottoms.  Are comfortable and fit you well.  Are closed at the toe. Do not wear sandals.  If you use a stepladder:  Make sure that it is fully opened. Do not climb a closed stepladder.  Make sure that both sides of the stepladder are locked into place.  Ask someone to hold it for you, if possible.  Clearly mark and make sure that you can see:  Any grab bars or handrails.  First and last steps.  Where the edge of each step is.  Use tools that help you move around (mobility aids) if they are needed. These include:  Canes.  Walkers.  Scooters.  Crutches.  Turn on the lights when you go into a dark area. Replace any light bulbs as soon as they burn out.  Set up your furniture so you have a clear path. Avoid moving your furniture around.  If any of your floors are uneven, fix them.  If there are any pets around you, be aware of where they are.  Review your medicines with your doctor. Some medicines can make you feel dizzy. This can increase your chance of falling. Ask your doctor what other things that you can do to help prevent falls. This information is not intended to replace advice given to you by your health care provider. Make sure you discuss any questions you have with your health care provider. Document Released: 07/05/2009 Document Revised: 02/14/2016 Document Reviewed: 10/13/2014 Elsevier Interactive Patient Education  2017 [employer].

## 2020-09-06 NOTE — Progress Notes (Signed)
Subjective:   Danielle Rocha is a 69 y.o. female who presents for Medicare Annual (Subsequent) preventive examination.  Review of Systems     Cardiac Risk Factors include: advanced age (>23men, >34 women);dyslipidemia     Objective:    There were no vitals filed for this visit. There is no height or weight on file to calculate BMI.  Advanced Directives 09/06/2020 01/02/2020 08/04/2019 07/29/2018 10/14/2017 09/01/2016 04/23/2016  Does Patient Have a Medical Advance Directive? No No Yes No No No No  Type of Advance Directive - - Living will;Healthcare Power of Attorney - - - -  Does patient want to make changes to medical advance directive? - - No - Patient declined - - - -  Copy of Healthcare Power of Attorney in Chart? - - No - copy requested - - - -  Would patient like information on creating a medical advance directive? Yes (MAU/Ambulatory/Procedural Areas - Information given) No - Patient declined - - No - Patient declined No - Patient declined -  Some encounter information is confidential and restricted. Go to Review Flowsheets activity to see all data.    Current Medications (verified) Outpatient Encounter Medications as of 09/06/2020  Medication Sig  . acetaminophen (TYLENOL) 500 MG tablet Take 1,000 mg by mouth every 6 (six) hours as needed for mild pain, moderate pain, fever or headache.  Marland Kitchen atorvastatin (LIPITOR) 20 MG tablet Take 1 tablet by mouth once daily  . buPROPion (WELLBUTRIN XL) 300 MG 24 hr tablet Take 150 mg by mouth 2 (two) times daily. Pt states she is taking twice a day  . cephALEXin (KEFLEX) 250 MG capsule Take 250 mg by mouth daily. Takes daily for frequent UTIs.  . cholecalciferol (VITAMIN D) 1000 units tablet Take 1,000 Units by mouth daily.  Marland Kitchen co-enzyme Q-10 30 MG capsule Take 30 mg by mouth daily.  . Cyanocobalamin (B-12 PO) Take by mouth.  . flecainide (TAMBOCOR) 100 MG tablet Take 1 tablet by mouth twice daily  . gabapentin (NEURONTIN) 300 MG capsule  Take 300 mg by mouth at bedtime.   Marland Kitchen LORazepam (ATIVAN) 0.5 MG tablet Take 1 mg by mouth at bedtime. Takes 1 in the am and 2 at night  . Melatonin 3 MG TABS Take 2 tablets by mouth at bedtime.   . Multiple Vitamin (MULTIVITAMIN WITH MINERALS) TABS tablet Take 1 tablet by mouth daily.  Marland Kitchen omeprazole (PRILOSEC) 20 MG capsule TAKE 1 CAPSULE BY MOUTH TWICE DAILY BEFORE A MEAL  . propranolol (INDERAL) 20 MG tablet Take 1 tablet (20 mg total) by mouth 2 (two) times daily.  . sertraline (ZOLOFT) 50 MG tablet Take 1 tablet (50 mg total) by mouth daily.  . traZODone (DESYREL) 150 MG tablet Take 150 mg by mouth at bedtime.   Carlena Hurl 20 MG TABS tablet TAKE 1 TABLET BY MOUTH ONCE DAILY WITH SUPPER  . FERROUS SULFATE PO Take by mouth. (Patient not taking: Reported on 09/06/2020)  . [DISCONTINUED] buPROPion (WELLBUTRIN XL) 150 MG 24 hr tablet Take 450 mg by mouth daily. (Patient not taking: Reported on 09/06/2020)   No facility-administered encounter medications on file as of 09/06/2020.    Allergies (verified) Reglan [metoclopramide], Ciprofloxacin, Clindamycin/lincomycin, Vibramycin [doxycycline], Macrobid [nitrofurantoin monohyd macro], and Sulfa antibiotics   History: Past Medical History:  Diagnosis Date  . Arthritis    DJD, low back, thumb  . Atrial fibrillation (HCC)    Xarelto anticoagulation. Flecainide antiarrythmic   . Chicken pox   . Chronic pain  syndrome    On disability. History of bilateral hip pain, low back pain. Gabapentin 400 BID, percocet 1 tablet daily per prior provider.   . Colon polyp    awaiting records  . Depression    zoloft 100mg , remeron 30mg  per psychiatry. ambien 10mg  per psychiatry to help wtih sleep element.   . Dystonia    described as psychogenic dystonia. Pain and twisting from upper chest and up with triggers "wind, creamy food" on TID ativan per psychiatry previously.   . Fibromyalgia   . GERD (gastroesophageal reflux disease)    omeprazole OTC  . Goiter     states multiple imaging tests, has had biopsies  . Hyperlipidemia    lovastatin 20mg   . Stroke (HCC)    TIA (left side of face and body decreased sensitivity than right face and side)  . TIA (transient ischemic attack)   . Vaginal atrophy    estrace vaginal cream   Past Surgical History:  Procedure Laterality Date  . ATRIAL FIBRILLATION ABLATION    . BIOPSY THYROID    . fusion l4-l5  2000  . LAMINECTOMY     L4-L5  . OTHER SURGICAL HISTORY     tennis elbow surgery  . piriformis release  1999  . TONSILLECTOMY AND ADENOIDECTOMY  age 89  . VAGINAL HYSTERECTOMY  1993   Family History  Problem Relation Age of Onset  . Alcohol abuse Mother   . Hypertension Father   . Alcohol abuse Father   . Pancreatic cancer Father   . Cancer Brother        spindle cell RLE  . Diabetes Brother   . Cancer Brother        tonsil cancer  . Skin cancer Brother   . Colon cancer Neg Hx   . Colon polyps Neg Hx    Social History   Socioeconomic History  . Marital status: Married    Spouse name: Not on file  . Number of children: Not on file  . Years of education: Not on file  . Highest education level: Not on file  Occupational History  . Occupation: retired  Tobacco Use  . Smoking status: Never Smoker  . Smokeless tobacco: Never Used  Vaping Use  . Vaping Use: Never used  Substance and Sexual Activity  . Alcohol use: No    Alcohol/week: 0.0 standard drinks  . Drug use: No  . Sexual activity: Never    Partners: Male  Other Topics Concern  . Not on file  Social History Narrative   Family: Married. Husband works as Hospital doctordriver for [employer]. 2 children from previous marriage 1 from current. Son Danielle Rocha lives with them and has down's syndrome.       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 [id number] and became disabled in that time frame due to hip/back issues.       Hobbies: time with son   Social Determinants of Health   Financial  Resource Strain: Low Risk   . Difficulty of Paying Living Expenses: Not hard at all  Food Insecurity: No Food Insecurity  . Worried About Programme researcher, broadcasting/film/videounning Out of Food in the Last Year: Never true  . Ran Out of Food in the Last Year: Never true  Transportation Needs: No Transportation Needs  . Lack of Transportation (Medical): No  . Lack of Transportation (Non-Medical): No  Physical Activity: Inactive  . Days of Exercise per Week: 0 days  . Minutes of Exercise  per Session: 0 min  Stress: Stress Concern Present  . Feeling of Stress : To some extent  Social Connections: Socially Isolated  . Frequency of Communication with Friends and Family: Once a week  . Frequency of Social Gatherings with Friends and Family: Once a week  . Attends Religious Services: Never  . Active Member of Clubs or Organizations: No  . Attends Banker Meetings: Never  . Marital Status: Married    Tobacco Counseling Counseling given: Not Answered   Clinical Intake:  Pre-visit preparation completed: Yes  Pain : No/denies pain     BMI - recorded: 24.75 Nutritional Status: BMI of 19-24  Normal Nutritional Risks: Nausea/ vomitting/ diarrhea (diarrhea at times) Diabetes: No  How often do you need to have someone help you when you read instructions, pamphlets, or other written materials from your doctor or pharmacy?: 1 - Never  Diabetic?No  Interpreter Needed?: No  Information entered by :: Lanier Ensign, LPN   Activities of Daily Living In your present state of health, do you have any difficulty performing the following activities: 09/06/2020  Hearing? N  Vision? N  Difficulty concentrating or making decisions? Y  Walking or climbing stairs? N  Dressing or bathing? N  Doing errands, shopping? N  Preparing Food and eating ? Y  Comment don't do heavy lifting afraid of dropping something  Using the Toilet? N  In the past six months, have you accidently leaked urine? Y  Comment at times  Do  you have problems with loss of bowel control? N  Managing your Medications? N  Managing your Finances? N  Housekeeping or managing your Housekeeping? N  Some recent data might be hidden    Patient Care Team: Shelva Majestic, MD as PCP - General (Family Medicine) Alfredo Martinez, MD as Consulting Physician (Urology) Pricilla Riffle, MD as Consulting Physician (Cardiology) Archer Asa, MD as Consulting Physician (Psychiatry)  Indicate any recent Medical Services you may have received from other than [facility] providers in the past year (date may be approximate).     Assessment:   This is a routine wellness examination for Danielle Rocha.  Hearing/Vision screen  Hearing Screening             Right ear:           Left ear:           Comments: Denies any hearing issues   Vision Screening Comments: Pt [place]   Dietary issues and exercise activities discussed: Current Exercise Habits: The patient does not participate in regular exercise at present  Goals    . Patient Stated     Like some communication from her oldest son     . Patient Stated     None at this time      Depression Screen PHQ 2/9 Scores 09/06/2020 02/07/2020 10/28/2019 08/08/2019 08/04/2019 08/03/2019 05/04/2019  PHQ - 2 Score 0 PHQ- 9 Score - 5 3 0 Fall Risk Fall Risk  09/06/2020 11/03/2019 08/04/2019 07/29/2018 04/27/2017  Falls in the past year? 0 0 0 0 Yes  Comment - - - fell a while back walking down stairs to the driveway ; bruised eye and laceration on forehead. pushed teeth ; broke wrist  -  Number falls in past yr: 0 0 - 0 1  Injury with Fall? 0 0 0 1 Yes  Risk for fall due to : Impaired vision;Impaired balance/gait - - Impaired balance/gait (No Data)  Risk for fall due to: Comment - - - - Tripped down stairs  Follow up Falls prevention discussed - - Education provided -     FALL RISK PREVENTION PERTAINING TO THE HOME:  Any stairs in or around the home? Yes  If so, are there any without handrails? No  Home free of loose throw rugs in walkways, pet beds, electrical cords, etc? Yes  Adequate lighting in your home to reduce risk of falls? Yes   ASSISTIVE DEVICES UTILIZED TO PREVENT FALLS:  Life alert? No  Use of a cane, walker or w/c? No  Grab bars in the bathroom? No  Shower chair or bench in shower? No  Elevated toilet seat or a handicapped toilet? No   TIMED UP AND GO:  Was the test performed? No .     Cognitive Function:     6CIT Screen 09/06/2020 07/29/2018  What Year? 0 points 0 points  What month? 0 points 0 points  What time? - 0 points  Count back from 20 0 points 0 points  Months in reverse 2 points 0 points  Repeat phrase 2 points 0 points  Total Score - 0    Immunizations Immunization History  Administered Date(s) Administered  . Fluad Quad(high Dose 65+) 07/24/2020  . Influenza, High Dose Seasonal PF 07/29/2017, 05/27/2019  . Influenza-Unspecified 06/09/2013, 06/06/2014, 07/04/2015, 07/26/2016, 07/26/2018  . PFIZER SARS-COV-2 Vaccination 11/13/2019, 12/07/2019  . Pneumococcal Conjugate-13 04/27/2017  . Pneumococcal Polysaccharide-23 09/23/2012, 10/28/2018  . Td 09/23/2012  . Tdap 06/13/2012  . Zoster 08/16/2012    TDAP status: Up to date  Flu Vaccine status: Up to date Done 07/24/20  Pneumococcal vaccine status: Up to date  Covid-19 vaccine status: Completed vaccines  Qualifies for Shingles Vaccine? Yes   Zostavax completed Yes   Shingrix Completed?: No.    Education has been provided regarding the importance of this vaccine. Patient has been advised to call insurance company to determine out of pocket expense if they have not yet received this vaccine. Advised may also receive vaccine at local pharmacy or Health Dept. Verbalized acceptance and understanding.  Screening Tests Health Maintenance  Topic Date Due   . MAMMOGRAM  03/24/2020  . COVID-19 Vaccine (3 - Booster for Pfizer series) 06/08/2020  . Hepatitis C Screening  06/22/2055 (Originally 12-27-1950)  . TETANUS/TDAP  09/23/2022  . COLONOSCOPY  01/01/2030  . INFLUENZA VACCINE  Completed  . DEXA SCAN  Completed  . PNA vac Low Risk Adult  Completed    Health Maintenance  Health Maintenance Due  Topic Date Due  . MAMMOGRAM  03/24/2020  . COVID-19 Vaccine (3 - Booster for Pfizer series) 06/08/2020    Colorectal cancer screening: Type of screening: Colonoscopy. Completed 01/02/20. Repeat every 10 years  Mammogram status: Completed 03/24/18. Repeat every year  Bone Density status: Completed 10/31/15. Results reflect: Bone density results: NORMAL. Repeat every 2 years.   Additional Screening:  Hepatitis C Screening: does qualify;  Vision Screening: Recommended annual ophthalmology exams for early detection of glaucoma and other disorders of the eye. Is the patient up to date with their annual eye exam?  Yes  Who is the provider or what is the name of the office in which the patient attends annual eye exams? Making an annual app[place]   Dental Screening: Recommended annual dental exams for proper oral hygiene  Community Resource Referral /  Chronic Care Management: CRR required this visit?  No   CCM required this visit?  No      Plan:     I have personally reviewed and noted the following in the patient's chart:   . Medical and social history . Use of alcohol, tobacco or illicit drugs  . Current medications and supplements . Functional ability and status . Nutritional status . Physical activity . Advanced directives . List of other physicians . Hospitalizations, surgeries, and ER visits in previous 12 months . Vitals . Screenings to include cognitive, depression, and falls . Referrals and appointments  In addition, I have reviewed and discussed with patient certain preventive protocols, quality  metrics, and best practice recommendations. A written personalized care plan for preventive services as well as general preventive health recommendations were provided to patient.     Marzella Schlein, LPN   [id number]   Nurse Notes: None

## 2020-09-10 ENCOUNTER — Other Ambulatory Visit: Payer: Self-pay | Admitting: Family Medicine

## 2020-09-10 ENCOUNTER — Other Ambulatory Visit: Payer: Self-pay | Admitting: Neurology

## 2020-09-12 ENCOUNTER — Telehealth: Payer: Self-pay | Admitting: Neurology

## 2020-09-12 NOTE — Telephone Encounter (Signed)
I returned the call to the patient to explain she is not longer having to follow up with our office. States she sees a psychiatrist on a regular basis and plans to discuss the medication with him.

## 2020-09-12 NOTE — Telephone Encounter (Signed)
Pt called, would like to discuss why sertraline (ZOLOFT) 50 MG tablet refill has been refused. Would like a call from the nurse.

## 2020-09-23 ENCOUNTER — Other Ambulatory Visit: Payer: Self-pay | Admitting: Family Medicine

## 2020-10-02 DIAGNOSIS — R69 Illness, unspecified: Secondary | ICD-10-CM | POA: Diagnosis not present

## 2020-10-21 ENCOUNTER — Other Ambulatory Visit: Payer: Self-pay | Admitting: Family Medicine

## 2020-10-26 ENCOUNTER — Other Ambulatory Visit: Payer: Self-pay

## 2020-10-26 MED ORDER — OMEPRAZOLE 20 MG PO CPDR: DELAYED_RELEASE_CAPSULE | ORAL | 3 refills | Status: DC

## 2020-10-29 DIAGNOSIS — R69 Illness, unspecified: Secondary | ICD-10-CM | POA: Diagnosis not present

## 2020-11-06 DIAGNOSIS — R69 Illness, unspecified: Secondary | ICD-10-CM | POA: Diagnosis not present

## 2020-11-18 ENCOUNTER — Other Ambulatory Visit: Payer: Self-pay | Admitting: Family Medicine

## 2020-12-02 ENCOUNTER — Other Ambulatory Visit: Payer: Self-pay | Admitting: Family Medicine

## 2020-12-03 NOTE — Telephone Encounter (Signed)
Please schedule an appointment with Dr.Hunter for further refills 

## 2020-12-03 NOTE — Telephone Encounter (Signed)
Please schedule an appointment for further refills.

## 2020-12-14 DIAGNOSIS — R69 Illness, unspecified: Secondary | ICD-10-CM | POA: Diagnosis not present

## 2021-01-03 DIAGNOSIS — R69 Illness, unspecified: Secondary | ICD-10-CM | POA: Diagnosis not present

## 2021-01-04 ENCOUNTER — Other Ambulatory Visit: Payer: Self-pay | Admitting: Family Medicine

## 2021-01-07 ENCOUNTER — Other Ambulatory Visit: Payer: Self-pay | Admitting: Family Medicine

## 2021-01-07 NOTE — Telephone Encounter (Signed)
Please schedule an appointment for further refills.

## 2021-01-09 ENCOUNTER — Other Ambulatory Visit: Payer: Self-pay

## 2021-01-09 ENCOUNTER — Encounter: Payer: Self-pay | Admitting: Family Medicine

## 2021-01-09 MED ORDER — ATORVASTATIN CALCIUM 20 MG PO TABS: 1.0000 | ORAL_TABLET | Freq: Every day | ORAL | 3 refills | Status: DC

## 2021-01-20 ENCOUNTER — Other Ambulatory Visit: Payer: Self-pay | Admitting: Family Medicine

## 2021-01-23 ENCOUNTER — Other Ambulatory Visit: Payer: Self-pay | Admitting: Family Medicine

## 2021-01-29 ENCOUNTER — Other Ambulatory Visit: Payer: Self-pay

## 2021-01-29 ENCOUNTER — Encounter: Payer: Self-pay | Admitting: Family Medicine

## 2021-01-29 MED ORDER — ATORVASTATIN CALCIUM 20 MG PO TABS: 1.0000 | ORAL_TABLET | Freq: Every day | ORAL | 5 refills | Status: DC

## 2021-02-04 DIAGNOSIS — R69 Illness, unspecified: Secondary | ICD-10-CM | POA: Diagnosis not present

## 2021-02-06 ENCOUNTER — Encounter: Payer: Self-pay | Admitting: Family Medicine

## 2021-02-06 ENCOUNTER — Other Ambulatory Visit: Payer: Self-pay

## 2021-02-11 ENCOUNTER — Other Ambulatory Visit: Payer: Self-pay | Admitting: Family Medicine

## 2021-03-12 ENCOUNTER — Emergency Department (HOSPITAL_COMMUNITY): Payer: Medicare HMO

## 2021-03-12 ENCOUNTER — Telehealth: Payer: Self-pay

## 2021-03-12 ENCOUNTER — Other Ambulatory Visit: Payer: Self-pay | Admitting: Family Medicine

## 2021-03-12 ENCOUNTER — Emergency Department (HOSPITAL_COMMUNITY)
Admission: EM | Admit: 2021-03-12 | Discharge: 2021-03-12 | Disposition: A | Discharge status: Home / Self Care | Payer: Medicare HMO | Attending: Emergency Medicine | Admitting: Emergency Medicine

## 2021-03-12 ENCOUNTER — Encounter (HOSPITAL_COMMUNITY): Payer: Self-pay | Admitting: *Deleted

## 2021-03-12 DIAGNOSIS — T424X1A Poisoning by benzodiazepines, accidental (unintentional), initial encounter: Secondary | ICD-10-CM | POA: Diagnosis not present

## 2021-03-12 DIAGNOSIS — R69 Illness, unspecified: Secondary | ICD-10-CM | POA: Diagnosis not present

## 2021-03-12 DIAGNOSIS — Z043 Encounter for examination and observation following other accident: Secondary | ICD-10-CM | POA: Diagnosis not present

## 2021-03-12 DIAGNOSIS — I4891 Unspecified atrial fibrillation: Secondary | ICD-10-CM | POA: Diagnosis not present

## 2021-03-12 DIAGNOSIS — R251 Tremor, unspecified: Secondary | ICD-10-CM | POA: Insufficient documentation

## 2021-03-12 DIAGNOSIS — Z79899 Other long term (current) drug therapy: Secondary | ICD-10-CM | POA: Diagnosis not present

## 2021-03-12 DIAGNOSIS — Z7901 Long term (current) use of anticoagulants: Secondary | ICD-10-CM | POA: Insufficient documentation

## 2021-03-12 DIAGNOSIS — W19XXXA Unspecified fall, initial encounter: Secondary | ICD-10-CM | POA: Insufficient documentation

## 2021-03-12 DIAGNOSIS — T50901A Poisoning by unspecified drugs, medicaments and biological substances, accidental (unintentional), initial encounter: Secondary | ICD-10-CM

## 2021-03-12 DIAGNOSIS — R9431 Abnormal electrocardiogram [ECG] [EKG]: Secondary | ICD-10-CM | POA: Diagnosis not present

## 2021-03-12 LAB — ETHANOL: Alcohol, Ethyl (B): <10 mg/dL (ref <10)

## 2021-03-12 LAB — COMPREHENSIVE METABOLIC PANEL
ALT: 18 U/L (ref 0–44)
AST: 19 U/L (ref 15–41)
Albumin: 3.9 g/dL (ref 3.5–5.0)
Alkaline Phosphatase: 46 U/L (ref 38–126)
Anion gap: 5 (ref 5–15)
BUN: 14 mg/dL (ref 8–23)
CO2: 27 mmol/L (ref 22–32)
Calcium: 9.3 mg/dL (ref 8.9–10.3)
Chloride: 107 mmol/L (ref 98–111)
Creatinine, Ser: 0.71 mg/dL (ref 0.44–1.00)
GFR, Estimated: >60 mL/min (ref >60)
Glucose, Bld: 106 mg/dL — ABNORMAL HIGH (ref 70–99)
Potassium: 3.7 mmol/L (ref 3.5–5.1)
Sodium: 139 mmol/L (ref 135–145)
Total Bilirubin: 0.5 mg/dL (ref 0.3–1.2)
Total Protein: 6.3 g/dL — ABNORMAL LOW (ref 6.5–8.1)

## 2021-03-12 LAB — CBC WITH DIFFERENTIAL/PLATELET
Abs Immature Granulocytes: 0.01 10*3/uL (ref 0.00–0.07)
Basophils Absolute: 0 10*3/uL (ref 0.0–0.1)
Basophils Relative: 1 %
Eosinophils Absolute: 0.1 10*3/uL (ref 0.0–0.5)
Eosinophils Relative: 2 %
HCT: 41.3 % (ref 36.0–46.0)
Hemoglobin: 14.1 g/dL (ref 12.0–15.0)
Immature Granulocytes: 0 %
Lymphocytes Relative: 29 %
Lymphs Abs: 1.9 10*3/uL (ref 0.7–4.0)
MCH: 29.5 pg (ref 26.0–34.0)
MCHC: 34.1 g/dL (ref 30.0–36.0)
MCV: 86.4 fL (ref 80.0–100.0)
Monocytes Absolute: 0.6 10*3/uL (ref 0.1–1.0)
Monocytes Relative: 9 %
Neutro Abs: 3.9 10*3/uL (ref 1.7–7.7)
Neutrophils Relative %: 59 %
Platelets: 183 10*3/uL (ref 150–400)
RBC: 4.78 MIL/uL (ref 3.87–5.11)
RDW: 12.2 % (ref 11.5–15.5)
WBC: 6.6 10*3/uL (ref 4.0–10.5)
nRBC: 0 % (ref 0.0–0.2)

## 2021-03-12 MED ORDER — DIPHENHYDRAMINE HCL 50 MG/ML IJ SOLN
25.0000 mg | Freq: Once | INTRAMUSCULAR | Status: AC
  Administered 2021-03-12: 25 mg via INTRAVENOUS
  Filled 2021-03-12: qty 1

## 2021-03-12 NOTE — Telephone Encounter (Signed)
Team I am open to referring her to neurology under dystonia-the issue is it may take several months to get in and also the dystonia has been described as psychogenic dystonia and Ativan was prescribed by psychiatry-difficult for me to know how to treat this in light of this-certainly not my area of expertise

## 2021-03-12 NOTE — ED Provider Notes (Signed)
[facility]-EMERGENCY DEPT Provider Note   CSN: [id number] Arrival date & time: 03/12/21  5462     History No chief complaint on file.   Danielle Rocha is a 70 y.o. female.  HPI Patient presents with pain in her knees, hips following a fall that occurred after possible overdose. Patient has multiple medical issues, including tremor.  She notes that she has a tremor primarily about her head and neck for 7 years, has been seen and evaluated by neurology, and reportedly was just dismissed from neurology practice. She is taken multiple medication, and most recently has been using Ativan, 1 or 2 mg.  She notes that over the past few days she has seemingly had worsening tremor, and today in an attempt to improve it she took 4 Ativan tablets at once. She had a fall subsequent to this, without loss of consciousness, without new weakness, without worsening tremor but with onset of pain in her knees.  No head trauma.  She inconsistently complains of the pain, intermittently denying, then describing pain bilaterally, soreness. History is obtained by patient and chart review.  Chart review notable for neurology note suggesting no further follow-up required with neurology clinic.    Past Medical History:  Diagnosis Date   Arthritis    DJD, low back, thumb   Atrial fibrillation (HCC)    Xarelto anticoagulation. Flecainide antiarrythmic    Chicken pox    Chronic pain syndrome    On disability. History of bilateral hip pain, low back pain. Gabapentin 400 BID, percocet 1 tablet daily per prior provider.    Colon polyp    awaiting records   Depression    zoloft 100mg , remeron 30mg  per psychiatry. ambien 10mg  per psychiatry to help wtih sleep element.    Dystonia    described as psychogenic dystonia. Pain and twisting from upper chest and up with triggers "wind, creamy food" on TID ativan per psychiatry previously.    Fibromyalgia    GERD (gastroesophageal reflux disease)     omeprazole OTC   Goiter    states multiple imaging tests, has had biopsies   Hyperlipidemia    lovastatin 20mg    Stroke [facility])    TIA (left side of face and body decreased sensitivity than right face and side)   TIA (transient ischemic attack)    Vaginal atrophy    estrace vaginal cream    Patient Active Problem List   Diagnosis Date Noted   Involuntary movements 02/14/2020   Depression with anxiety 02/14/2020   Aortic atherosclerosis (HCC) 02/07/2020   Delayed gastric emptying 02/07/2020   Dystonia    Restless legs 07/27/2018   Recurrent UTI 10/07/2016   Major depression in full remission (HCC) 09/02/2016   GERD (gastroesophageal reflux disease) 01/30/2016   History of SI/intentional drug overdose 11/22/2015   Atrial fibrillation (HCC)    Chronic pain syndrome    Hyperlipidemia     Past Surgical History:  Procedure Laterality Date   ATRIAL FIBRILLATION ABLATION     BIOPSY THYROID     fusion l4-l5  2000   LAMINECTOMY     L4-L5   OTHER SURGICAL HISTORY     tennis elbow surgery   piriformis release  1999   TONSILLECTOMY AND ADENOIDECTOMY  age 68   VAGINAL HYSTERECTOMY  1993     OB History     Gravida  3   Para  3   Term  2   Preterm  1   AB  Living  3      SAB      IAB      Ectopic      Multiple      Live Births  3           Family History  Problem Relation Age of Onset   Alcohol abuse Mother    Hypertension Father    Alcohol abuse Father    Pancreatic cancer Father    Cancer Brother        spindle cell RLE   Diabetes Brother    Cancer Brother        tonsil cancer   Skin cancer Brother    Colon cancer Neg Hx    Colon polyps Neg Hx     Social History   Tobacco Use   Smoking status: Never   Smokeless tobacco: Never  Vaping Use   Vaping Use: Never used  Substance Use Topics   Alcohol use: No    Alcohol/week: 0.0 standard drinks   Drug use: No    Home Medications Prior to Admission medications   Medication Sig Start  Date End Date Taking? Authorizing Provider  XARELTO 20 MG TABS tablet TAKE 1 TABLET BY MOUTH ONCE DAILY WITH SUPPER 02/12/21  Yes Shelva MajesticHunter, Stephen O, MD  acetaminophen (TYLENOL) 500 MG tablet Take 1,000 mg by mouth every 6 (six) hours as needed for mild pain, moderate pain, fever or headache.    [provider]  atorvastatin (LIPITOR) 20 MG tablet Take 1 tablet (20 mg total) by mouth daily. 01/29/21   Shelva MajesticHunter, Stephen O, MD  buPROPion (WELLBUTRIN XL) 150 MG 24 hr tablet Take 450 mg by mouth every morning. 02/04/21   [provider]  buPROPion (WELLBUTRIN XL) 300 MG 24 hr tablet Take 150 mg by mouth 2 (two) times daily. Pt states she is taking twice a day 04/22/19   [provider]  cephALEXin (KEFLEX) 250 MG capsule Take 250 mg by mouth daily. Takes daily for frequent UTIs. 10/25/19   [provider]  cholecalciferol (VITAMIN D) 1000 units tablet Take 1,000 Units by mouth daily.    [provider]  co-enzyme Q-10 30 MG capsule Take 30 mg by mouth daily.    [provider]  Cyanocobalamin (B-12 PO) Take by mouth.    [provider]  FERROUS SULFATE PO Take by mouth. Patient not taking: No sig reported    [provider]  flecainide (TAMBOCOR) 100 MG tablet Take 1 tablet by mouth twice daily 02/12/21   Shelva MajesticHunter, Stephen O, MD  gabapentin (NEURONTIN) 300 MG capsule Take 300 mg by mouth at bedtime.     [provider]  LORazepam (ATIVAN) 0.5 MG tablet Take 1 mg by mouth at bedtime. Takes 1 in the am and 2 at night 12/26/19   [provider]  LORazepam (ATIVAN) 1 MG tablet Take 1 mg by mouth 3 (three) times daily. 03/06/21   [provider]  Melatonin 3 MG TABS Take 2 tablets by mouth at bedtime.     [provider]  Multiple Vitamin (MULTIVITAMIN WITH MINERALS) TABS tablet Take 1 tablet by mouth daily.    [provider]  omeprazole (PRILOSEC) 20 MG capsule TAKE 1 CAPSULE BY MOUTH TWICE DAILY BEFORE A  MEAL 10/26/20   Shelva MajesticHunter, Stephen O, MD  propranolol (INDERAL) 20 MG tablet Take 1 tablet (20 mg total) by mouth 2 (two) times daily. 07/03/20   Levert FeinsteinYan, Yijun, MD  sertraline (ZOLOFT) 50  MG tablet Take 1 tablet (50 mg total) by mouth daily. 02/14/20   Levert Feinstein, MD  traZODone (DESYREL) 150 MG tablet Take 150 mg by mouth at bedtime.     [provider]    Allergies    Reglan [metoclopramide], Ciprofloxacin, Clindamycin/lincomycin, Vibramycin [doxycycline], Macrobid [nitrofurantoin monohyd macro], and Sulfa antibiotics  Review of Systems   Review of Systems  Constitutional:        Per HPI, otherwise negative  HENT:         Per HPI, otherwise negative  Respiratory:         Per HPI, otherwise negative  Cardiovascular:        Per HPI, otherwise negative  Gastrointestinal:  Negative for vomiting.  Endocrine:       Negative aside from HPI  Genitourinary:        Neg aside from HPI   Musculoskeletal:        Per HPI, otherwise negative  Skin: Negative.   Neurological:  Positive for tremors. Negative for syncope.   Physical Exam Updated Vital Signs BP (!) 113/59   Pulse (!) 51   Temp 98.2 F (36.8 C) (Oral)   Resp 18   LMP  (LMP Unknown)   SpO2 97%   Physical Exam Vitals and nursing note reviewed.  Constitutional:      General: She is not in acute distress.    Appearance: She is well-developed.  HENT:     Head: Normocephalic and atraumatic.  Eyes:     Conjunctiva/sclera: Conjunctivae normal.  Cardiovascular:     Rate and Rhythm: Normal rate and regular rhythm.  Pulmonary:     Effort: Pulmonary effort is normal. No respiratory distress.     Breath sounds: Normal breath sounds. No stridor.  Abdominal:     General: There is no distension.  Musculoskeletal:     Comments: No gross abnormalities, deformities.  Patient describes some tenderness with palpation of her pelvis, but it is stable.  She flexes each hip independently to command, similarly flexes and extends each knee  appropriately and during range of motion denies pain, but at rest states that her knees both hurt.  Skin:    General: Skin is warm and dry.  Neurological:     Mental Status: She is alert and oriented to person, place, and time.     Cranial Nerves: No cranial nerve deficit.     Motor: Tremor present.     Comments: Tremor head and neck, continuous.  Otherwise neurologic exam unremarkable, follows commands appropriately, face is symmetric, speech is clear.    ED Results / Procedures / Treatments   Labs (all labs ordered are listed, but only abnormal results are displayed) Labs Reviewed  COMPREHENSIVE METABOLIC PANEL - Abnormal; Notable for the following components:      Result Value   Glucose, Bld 106 (*)    Total Protein 6.3 (*)    All other components within normal limits  ETHANOL  CBC WITH DIFFERENTIAL/PLATELET  URINALYSIS, ROUTINE W REFLEX MICROSCOPIC    EKG 60 sinus artifact abnormal  On cardiac monitor patient has similar, sinus rhythm, rate 60s, substantial artifact, but grossly unremarkable. Pulse ox 100% room air normal  Radiology DG Pelvis 1-2 Views  Result Date: 03/12/2021 CLINICAL DATA:  Fall after taking Ativan EXAM: PELVIS - 1-2 VIEW COMPARISON:  CT abdomen pelvis 11/04/2019 FINDINGS: There is no radiographically evident pelvic fracture or hip fracture on single frontal view. No diastasis. Partial visualized L4-L5 lumbar fusion hardware. Mild  bilateral hip and pubic symphysis degenerative changes. IMPRESSION: No radiographically evident pelvic fracture on single frontal view. Electronically Signed   By: Caprice Renshaw   On: 03/12/2021 10:13   DG Knee Complete 4 Views Left  Result Date: 03/12/2021 CLINICAL DATA:  Fall EXAM: LEFT KNEE - COMPLETE 4+ VIEW COMPARISON:  None. FINDINGS: There is no evidence of acute fracture. There is mild tricompartment degenerative change, worst in the medial compartment. There is chondrocalcinosis. No significant joint effusion. Suprapatellar  enthesophyte. IMPRESSION: No evidence of acute fracture. Tricompartment degenerative changes of the left knee, worst in the medial compartment. Electronically Signed   By: Caprice Renshaw   On: 03/12/2021 10:16   DG Knee Complete 4 Views Right  Result Date: 03/12/2021 CLINICAL DATA:  Fall EXAM: RIGHT KNEE - COMPLETE 4+ VIEW COMPARISON:  None. FINDINGS: There is no evidence of acute fracture. There is mild tricompartment degenerative change with chondrocalcinosis, worst medially. No significant joint effusion. Suprapatellar enthesophyte. IMPRESSION: No evidence of acute fracture. Tricompartment degenerative changes of the right knee with chondrocalcinosis, worst medially. Electronically Signed   By: Caprice Renshaw   On: 03/12/2021 10:15    Procedures Procedures   Medications Ordered in ED Medications  diphenhydrAMINE (BENADRYL) injection 25 mg (25 mg Intravenous Given 03/12/21 1002)    ED Course  I have reviewed the triage vital signs and the nursing notes.  Pertinent labs & imaging results that were available during my care of the patient were reviewed by me and considered in my medical decision making (see chart for details).   12:14 PM On repeat exam patient is awake, alert, no longer tremulous.  I reviewed her x-rays, discussed them with her.  No evidence for fracture use, pelvis.  She has no ongoing complaints, we discussed other findings, which are similarly reassuring, no need for acute new pathology.  Patient did present after ingesting excessive amounts of Ativan, but there is no evidence for lingering effects, and after hours of monitoring, no hemodynamic instability. Patient appropriate for discharge with close outpatient follow-up to which she is amenable. MDM Rules/Calculators/A&P MDM Number of Diagnoses or Management Options Accidental drug overdose, initial encounter: new, needed workup Fall: new, needed workup Fall, initial encounter: new, needed workup   Amount and/or Complexity  of Data Reviewed Clinical lab tests: ordered and reviewed Tests in the radiology section of CPT: ordered and reviewed Tests in the medicine section of CPT: reviewed and ordered Decide to obtain previous medical records or to obtain history from someone other than the patient: yes Review and summarize past medical records: yes Independent visualization of images, tracings, or specimens: yes  Risk of Complications, Morbidity, and/or Mortality Presenting problems: high Diagnostic procedures: high Management options: high  Critical Care Total time providing critical care: < 30 minutes  Patient Progress Patient progress: improved   Final Clinical Impression(s) / ED Diagnoses Final diagnoses:  Fall, initial encounter  Accidental drug overdose, initial encounter     Gerhard Munch, MD 03/12/21 1216

## 2021-03-12 NOTE — Telephone Encounter (Signed)
Patient's husband called today to speak to Dr. Durene Cal about his wife's Dx of dystonia:  She is experiencing severe spasms and it is continuing to get worse and the medication is not helping at all. Husbands states she is miserable and needs some relief so he would like to talk to Dr. Durene Cal about any possible options.  Patient was taken to ED today because she took extra Ativan hoping it would help her but is made her sick

## 2021-03-12 NOTE — ED Triage Notes (Signed)
Per pt, son was concerned that the pt took 4 of her 1mg  ativan over the course of 1 hour in order to stop her tremors. She has hx of tremors, sees neurologist and has been told she possibly has psychogenic dystonia. She reports tremors are getting worse. She reports falling after taking the ativan.

## 2021-03-12 NOTE — ED Notes (Signed)
Call son, Arlys John, w any updates- number in chart

## 2021-03-12 NOTE — ED Notes (Signed)
Patient transported to CT 

## 2021-03-12 NOTE — ED Notes (Signed)
Called son, Arlys John, advised him pt is discharged and he said he will be here shortly to pick pt up. Pt wheeled outside [facility] to wait for ride (pt requested to be outside)

## 2021-03-12 NOTE — Discharge Instructions (Addendum)
As discussed, your evaluation today has been largely reassuring.  But, it is important that you monitor your condition carefully, and do not hesitate to return to the ED if you develop new, or concerning changes in your condition. ? ?Otherwise, please follow-up with your physician for appropriate ongoing care. ? ?

## 2021-03-13 ENCOUNTER — Other Ambulatory Visit: Payer: Self-pay

## 2021-03-13 DIAGNOSIS — G249 Dystonia, unspecified: Secondary | ICD-10-CM

## 2021-03-13 NOTE — Telephone Encounter (Signed)
Referral has been placed. 

## 2021-03-15 ENCOUNTER — Other Ambulatory Visit: Payer: Self-pay | Admitting: Family Medicine

## 2021-03-20 ENCOUNTER — Other Ambulatory Visit: Payer: Self-pay

## 2021-03-20 ENCOUNTER — Emergency Department (HOSPITAL_COMMUNITY): Payer: Medicare HMO

## 2021-03-20 ENCOUNTER — Telehealth: Payer: Self-pay

## 2021-03-20 ENCOUNTER — Emergency Department (HOSPITAL_COMMUNITY)
Admission: EM | Admit: 2021-03-20 | Discharge: 2021-03-20 | Disposition: A | Discharge status: Home / Self Care | Payer: Medicare HMO | Attending: Emergency Medicine | Admitting: Emergency Medicine

## 2021-03-20 ENCOUNTER — Encounter (HOSPITAL_COMMUNITY): Payer: Self-pay

## 2021-03-20 DIAGNOSIS — R1011 Right upper quadrant pain: Secondary | ICD-10-CM

## 2021-03-20 DIAGNOSIS — R1013 Epigastric pain: Secondary | ICD-10-CM | POA: Diagnosis not present

## 2021-03-20 DIAGNOSIS — R109 Unspecified abdominal pain: Secondary | ICD-10-CM | POA: Diagnosis not present

## 2021-03-20 DIAGNOSIS — K279 Peptic ulcer, site unspecified, unspecified as acute or chronic, without hemorrhage or perforation: Secondary | ICD-10-CM | POA: Diagnosis not present

## 2021-03-20 DIAGNOSIS — K219 Gastro-esophageal reflux disease without esophagitis: Secondary | ICD-10-CM | POA: Insufficient documentation

## 2021-03-20 DIAGNOSIS — I4891 Unspecified atrial fibrillation: Secondary | ICD-10-CM | POA: Insufficient documentation

## 2021-03-20 DIAGNOSIS — Z7901 Long term (current) use of anticoagulants: Secondary | ICD-10-CM | POA: Diagnosis not present

## 2021-03-20 DIAGNOSIS — K760 Fatty (change of) liver, not elsewhere classified: Secondary | ICD-10-CM | POA: Diagnosis not present

## 2021-03-20 LAB — COMPREHENSIVE METABOLIC PANEL
ALT: 17 U/L (ref 0–44)
AST: 17 U/L (ref 15–41)
Albumin: 4 g/dL (ref 3.5–5.0)
Alkaline Phosphatase: 47 U/L (ref 38–126)
Anion gap: 7 (ref 5–15)
BUN: 11 mg/dL (ref 8–23)
CO2: 25 mmol/L (ref 22–32)
Calcium: 9.3 mg/dL (ref 8.9–10.3)
Chloride: 106 mmol/L (ref 98–111)
Creatinine, Ser: 0.58 mg/dL (ref 0.44–1.00)
GFR, Estimated: >60 mL/min (ref >60)
Glucose, Bld: 105 mg/dL — ABNORMAL HIGH (ref 70–99)
Potassium: 4.2 mmol/L (ref 3.5–5.1)
Sodium: 138 mmol/L (ref 135–145)
Total Bilirubin: 0.6 mg/dL (ref 0.3–1.2)
Total Protein: 6.5 g/dL (ref 6.5–8.1)

## 2021-03-20 LAB — URINALYSIS, ROUTINE W REFLEX MICROSCOPIC
Bilirubin Urine: NEGATIVE
Glucose, UA: NEGATIVE mg/dL
Hgb urine dipstick: NEGATIVE
Ketones, ur: NEGATIVE mg/dL
Leukocytes,Ua: NEGATIVE
Nitrite: NEGATIVE
Protein, ur: NEGATIVE mg/dL
Specific Gravity, Urine: 1.004 — ABNORMAL LOW (ref 1.005–1.030)
pH: 5 (ref 5.0–8.0)

## 2021-03-20 LAB — CBC
HCT: 42 % (ref 36.0–46.0)
Hemoglobin: 14.5 g/dL (ref 12.0–15.0)
MCH: 29.2 pg (ref 26.0–34.0)
MCHC: 34.5 g/dL (ref 30.0–36.0)
MCV: 84.7 fL (ref 80.0–100.0)
Platelets: 214 10*3/uL (ref 150–400)
RBC: 4.96 MIL/uL (ref 3.87–5.11)
RDW: 12.1 % (ref 11.5–15.5)
WBC: 8.2 10*3/uL (ref 4.0–10.5)
nRBC: 0 % (ref 0.0–0.2)

## 2021-03-20 LAB — LIPASE, BLOOD: Lipase: 21 U/L (ref 11–51)

## 2021-03-20 MED ORDER — ONDANSETRON 4 MG PO TBDP
4.0000 mg | ORAL_TABLET | Freq: Once | ORAL | Status: AC
  Administered 2021-03-20: 4 mg via ORAL
  Filled 2021-03-20: qty 1

## 2021-03-20 MED ORDER — ONDANSETRON HCL 4 MG/2ML IJ SOLN
4.0000 mg | Freq: Once | INTRAMUSCULAR | Status: AC
  Administered 2021-03-20: 4 mg via INTRAVENOUS
  Filled 2021-03-20: qty 2

## 2021-03-20 MED ORDER — ONDANSETRON 4 MG PO TBDP: 4.0000 mg | ORAL_TABLET | Freq: Three times a day (TID) | ORAL | 0 refills | Status: AC | PRN

## 2021-03-20 MED ORDER — PANTOPRAZOLE SODIUM 20 MG PO TBEC: 40.0000 mg | DELAYED_RELEASE_TABLET | Freq: Every day | ORAL | 0 refills | Status: DC

## 2021-03-20 MED ORDER — SODIUM CHLORIDE 0.9 % IV BOLUS
1000.0000 mL | Freq: Once | INTRAVENOUS | Status: AC
  Administered 2021-03-20: 1000 mL via INTRAVENOUS

## 2021-03-20 MED ORDER — SODIUM CHLORIDE (PF) 0.9 % IJ SOLN
INTRAMUSCULAR | Status: AC
  Filled 2021-03-20: qty 50

## 2021-03-20 MED ORDER — MORPHINE SULFATE (PF) 4 MG/ML IV SOLN
4.0000 mg | Freq: Once | INTRAVENOUS | Status: AC
  Administered 2021-03-20: 4 mg via INTRAVENOUS
  Filled 2021-03-20: qty 1

## 2021-03-20 MED ORDER — IOHEXOL 300 MG/ML  SOLN
100.0000 mL | Freq: Once | INTRAMUSCULAR | Status: AC | PRN
  Administered 2021-03-20: 100 mL via INTRAVENOUS

## 2021-03-20 MED ORDER — ONDANSETRON HCL 4 MG/2ML IJ SOLN: 4.0000 mg | Freq: Once | INTRAMUSCULAR | Status: DC

## 2021-03-20 NOTE — ED Notes (Signed)
Pt verbalized dc instructions and follow up care. Alert and assisted out by staff. No iv. Leaving with son

## 2021-03-20 NOTE — Telephone Encounter (Signed)
Patient went to [facility].

## 2021-03-20 NOTE — ED Notes (Signed)
Pt made aware the need for urine specimen. Pt unable to provide one at this time. Pt instructed to use call bell to call out when assistance top restroom is needed.

## 2021-03-20 NOTE — ED Triage Notes (Signed)
C/o generalized abdominal pain. Mainly at the top mid area that started X1.5 weeks.  C/o nausea with no emesis   Patient stopped drinking caffeine/espresso drinks but did not help.   Last BM today and soft per patient   A/ox4 Ambulatory in triage.   Larey Seat about 1.5 weeks ago and had to come to ED.

## 2021-03-20 NOTE — Telephone Encounter (Signed)
Please schedule pt f/u on this.

## 2021-03-20 NOTE — Telephone Encounter (Signed)
Nurse Assessment Nurse: Danielle Perches, RN, [name] Date/Time Danielle Rocha Time): 03/20/2021 8:18:15 AM Confirm and document reason for call. If symptomatic, describe symptoms. ---Caller states that for 2 weeks she is having abdominal pains, makes her nauseated. Pain is located diffuse across the abdomen but seems worse on the left with palpation. Doesn't want to eat because it makes the pain much worse. When she ate last night, it made her weak and diaphoretic. No fever. Does the patient have any new or worsening symptoms? ---Yes Will a triage be completed? ---Yes Related visit to physician within the last 2 weeks? ---No Does the PT have any chronic conditions? (i.e. diabetes, asthma, this includes High risk factors for pregnancy, etc.) ---Yes List chronic conditions. ---esophageal gastritis Is this a behavioral health or substance abuse call? ---No Guidelines Guideline Title Affirmed Question Affirmed Notes Nurse Date/Time Danielle Rocha Time) Abdominal Pain - Female [1] SEVERE pain AND [2] age > 49 years Danielle Rocha 03/20/2021 8:23:56 AM PLEASE NOTE: All timestamps contained within this report are represented as [place] Standard Time. CONFIDENTIALTY NOTICE: This fax transmission is intended only for the addressee. It contains information that is legally privileged, confidential or otherwise protected from use or disclosure. If you are not the intended recipient, you are strictly prohibited from reviewing, disclosing, copying using or disseminating any of this information or taking any action in reliance on or regarding this information. If you have received this fax in error, please notify us immediately by telephone so that we can arrange for its return to [place]. Phone: [phone], Toll-Free: [phone], Fax: [phone] Page: 2 of 2 Call Id: [id number] Disp. Time Danielle Rocha Time) Disposition Final User 03/20/2021 8:25:49 AM Go to ED Now Yes Danielle Perches, RN, [name] Disagree/Comply  Comply Caller Understands Yes PreDisposition Did not know what to do Care Advice Given Per Guideline GO TO ED NOW: * You need to be seen in the Emergency Department. * Leave now. Drive carefully. ANOTHER ADULT SHOULD DRIVE: * It is better and safer if another adult drives instead of you. BRING MEDICINES: * Bring a list of your current medicines when you go to the Emergency Department (ER). CARE ADVICE given per Abdominal Pain, Female (Adult) guideline. Referrals GO TO FACILITY OTHER - SPECIFY

## 2021-03-20 NOTE — ED Notes (Signed)
Pt requesting pain meds, provider made aware.  

## 2021-03-21 NOTE — ED Provider Notes (Signed)
[facility]-EMERGENCY DEPT Provider Note   CSN: [id number] Arrival date & time: 03/20/21  9211     History No chief complaint on file.   Danielle Rocha is a 70 y.o. female.  HPI     70 year old female with a history of atrial fibrillation on Xarelto, depression, dystonia, hyperlipidemia, TIA, chronic pain syndrome who presents with concern for abdominal pain.  Reports she has had pain to the epigastric area radiating towards the right into the left for the last 1-1/2 weeks.  She has had nausea without vomiting.  Reports she had similar pain in the past, and had stopped caffeine her espresso drinks and it improved, but this time she tried dietary modification and it has not helped.  She takes omeprazole but does not feel this is helping her pain.  She has had normal bowel movements, denies diarrhea or constipation.  Denies fevers, chills.  Pain does seem to worsen with eating.  Denies dysuria.  Past Medical History:  Diagnosis Date   Arthritis    DJD, low back, thumb   Atrial fibrillation (HCC)    Xarelto anticoagulation. Flecainide antiarrythmic    Chicken pox    Chronic pain syndrome    On disability. History of bilateral hip pain, low back pain. Gabapentin 400 BID, percocet 1 tablet daily per prior provider.    Colon polyp    awaiting records   Depression    zoloft 100mg , remeron 30mg  per psychiatry. ambien 10mg  per psychiatry to help wtih sleep element.    Dystonia    described as psychogenic dystonia. Pain and twisting from upper chest and up with triggers "wind, creamy food" on TID ativan per psychiatry previously.    Fibromyalgia    GERD (gastroesophageal reflux disease)    omeprazole OTC   Goiter    states multiple imaging tests, has had biopsies   Hyperlipidemia    lovastatin 20mg    Stroke [facility])    TIA (left side of face and body decreased sensitivity than right face and side)   TIA (transient ischemic attack)    Vaginal atrophy    estrace  vaginal cream    Patient Active Problem List   Diagnosis Date Noted   Involuntary movements 02/14/2020   Depression with anxiety 02/14/2020   Aortic atherosclerosis (HCC) 02/07/2020   Delayed gastric emptying 02/07/2020   Dystonia    Restless legs 07/27/2018   Recurrent UTI 10/07/2016   Major depression in full remission (HCC) 09/02/2016   GERD (gastroesophageal reflux disease) 01/30/2016   History of SI/intentional drug overdose 11/22/2015   Atrial fibrillation (HCC)    Chronic pain syndrome    Hyperlipidemia     Past Surgical History:  Procedure Laterality Date   ATRIAL FIBRILLATION ABLATION     BIOPSY THYROID     fusion l4-l5  2000   LAMINECTOMY     L4-L5   OTHER SURGICAL HISTORY     tennis elbow surgery   piriformis release  1999   TONSILLECTOMY AND ADENOIDECTOMY  age 67   VAGINAL HYSTERECTOMY  1993     OB History     Gravida  3   Para  3   Term  2   Preterm  1   AB      Living  3      SAB      IAB      Ectopic      Multiple      Live Births  3  Family History  Problem Relation Age of Onset   Alcohol abuse Mother    Hypertension Father    Alcohol abuse Father    Pancreatic cancer Father    Cancer Brother        spindle cell RLE   Diabetes Brother    Cancer Brother        tonsil cancer   Skin cancer Brother    Colon cancer Neg Hx    Colon polyps Neg Hx     Social History   Tobacco Use   Smoking status: Never   Smokeless tobacco: Never  Vaping Use   Vaping Use: Never used  Substance Use Topics   Alcohol use: No    Alcohol/week: 0.0 standard drinks   Drug use: No    Home Medications Prior to Admission medications   Medication Sig Start Date End Date Taking? Authorizing Provider  ondansetron (ZOFRAN ODT) 4 MG disintegrating tablet Take 1 tablet (4 mg total) by mouth every 8 (eight) hours as needed for nausea or vomiting. 03/20/21  Yes Alvira Monday, MD  pantoprazole (PROTONIX) 20 MG tablet Take 2 tablets (40  mg total) by mouth daily for 14 days. 03/20/21 04/03/21 Yes Alvira Monday, MD  acetaminophen (TYLENOL) 500 MG tablet Take 1,000 mg by mouth every 6 (six) hours as needed for mild pain, moderate pain, fever or headache.    [provider]  atorvastatin (LIPITOR) 20 MG tablet Take 1 tablet (20 mg total) by mouth daily. 01/29/21   Shelva Majestic, MD  buPROPion (WELLBUTRIN XL) 150 MG 24 hr tablet Take 450 mg by mouth every morning. 02/04/21   [provider]  buPROPion (WELLBUTRIN XL) 300 MG 24 hr tablet Take 150 mg by mouth 2 (two) times daily. Pt states she is taking twice a day 04/22/19   [provider]  cephALEXin (KEFLEX) 250 MG capsule Take 250 mg by mouth daily. Takes daily for frequent UTIs. 10/25/19   [provider]  cholecalciferol (VITAMIN D) 1000 units tablet Take 1,000 Units by mouth daily.    [provider]  co-enzyme Q-10 30 MG capsule Take 30 mg by mouth daily.    [provider]  Cyanocobalamin (B-12 PO) Take by mouth.    [provider]  FERROUS SULFATE PO Take by mouth. Patient not taking: No sig reported    [provider]  flecainide (TAMBOCOR) 100 MG tablet Take 1 tablet by mouth twice daily 02/12/21   Shelva Majestic, MD  gabapentin (NEURONTIN) 300 MG capsule Take 300 mg by mouth at bedtime.     [provider]  LORazepam (ATIVAN) 0.5 MG tablet Take 1 mg by mouth at bedtime. Takes 1 in the am and 2 at night 12/26/19   [provider]  LORazepam (ATIVAN) 1 MG tablet Take 1 mg by mouth 3 (three) times daily. 03/06/21   [provider]  Melatonin 3 MG TABS Take 2 tablets by mouth at bedtime.     [provider]  Multiple Vitamin (MULTIVITAMIN WITH MINERALS) TABS tablet Take 1 tablet by mouth daily.    [provider]  propranolol (INDERAL) 20 MG tablet Take 1 tablet (20 mg total) by mouth 2 (two) times daily. 07/03/20   Levert Feinstein, MD  sertraline (ZOLOFT) 50 MG tablet  Take 1 tablet (50 mg total) by mouth daily. 02/14/20   Levert Feinstein, MD  traZODone (DESYREL) 150 MG tablet Take 150 mg by mouth at bedtime.     [provider]  XARELTO 20 MG TABS tablet TAKE 1 TABLET BY MOUTH ONCE DAILY WITH SUPPER 03/15/21   Shelva MajesticHunter, Stephen O, MD    Allergies    Reglan [metoclopramide], Ciprofloxacin, Clindamycin/lincomycin, Vibramycin [doxycycline], Macrobid [nitrofurantoin monohyd macro], and Sulfa antibiotics  Review of Systems   Review of Systems  Constitutional:  Negative for fever.  HENT:  Negative for sore throat.   Eyes:  Negative for visual disturbance.  Respiratory:  Negative for cough and shortness of breath.   Cardiovascular:  Negative for chest pain.  Gastrointestinal:  Positive for abdominal pain and nausea. Negative for constipation, diarrhea and vomiting.  Genitourinary:  Negative for difficulty urinating and dysuria.  Musculoskeletal:  Negative for back pain and neck pain.  Skin:  Negative for rash.  Neurological:  Negative for syncope and headaches.   Physical Exam Updated Vital Signs BP (!) 125/55   Pulse (!) 56   Temp 98.3 F (36.8 C) (Oral)   Resp 18   Ht 5' 7.75" (1.721 m)   Wt 70.3 kg   LMP  (LMP Unknown)   SpO2 96%   BMI 23.74 kg/m   Physical Exam Vitals and nursing note reviewed.  Constitutional:      General: She is not in acute distress.    Appearance: Normal appearance. She is not ill-appearing, toxic-appearing or diaphoretic.  HENT:     Head: Normocephalic.  Eyes:     Conjunctiva/sclera: Conjunctivae normal.  Cardiovascular:     Rate and Rhythm: Normal rate and regular rhythm.     Pulses: Normal pulses.  Pulmonary:     Effort: Pulmonary effort is normal. No respiratory distress.  Abdominal:     General: Abdomen is flat.     Tenderness: There is abdominal tenderness (epigastric, RUQ).  Musculoskeletal:        General: No deformity or signs of injury.     Cervical back: No rigidity.  Skin:    General: Skin is  warm and dry.     Coloration: Skin is not jaundiced or pale.  Neurological:     General: No focal deficit present.     Mental Status: She is alert and oriented to person, place, and time.    ED Results / Procedures / Treatments   Labs (all labs ordered are listed, but only abnormal results are displayed) Labs Reviewed  COMPREHENSIVE METABOLIC PANEL - Abnormal; Notable for the following components:      Result Value   Glucose, Bld 105 (*)    All other components within normal limits  URINALYSIS, ROUTINE W REFLEX MICROSCOPIC - Abnormal; Notable for the following components:   Color, Urine STRAW (*)    Specific Gravity, Urine 1.004 (*)    All other components within normal limits  LIPASE, BLOOD  CBC    EKG None  Radiology CT ABDOMEN PELVIS W CONTRAST  Result Date: 03/20/2021 CLINICAL DATA:  Acute generalized abdominal pain. EXAM: CT ABDOMEN AND PELVIS WITH CONTRAST TECHNIQUE: Multidetector CT imaging of the abdomen and pelvis was performed using the standard protocol following bolus administration of intravenous contrast. CONTRAST:  100mL OMNIPAQUE IOHEXOL 300 MG/ML  SOLN COMPARISON:  November 04, 2019. FINDINGS: Lower chest: No acute abnormality. Hepatobiliary: No gallstones or biliary dilatation is noted. Left hepatic cyst is noted. Pancreas: Unremarkable. No pancreatic ductal dilatation or surrounding inflammatory changes. Spleen: Normal in size without focal abnormality. Adrenals/Urinary Tract: Adrenal glands are unremarkable. Kidneys are normal, without renal calculi, focal lesion, or hydronephrosis. Bladder is unremarkable. Stomach/Bowel: Stomach is within  normal limits. Appendix appears normal. No evidence of bowel wall thickening, distention, or inflammatory changes. Vascular/Lymphatic: No significant vascular findings are present. No enlarged abdominal or pelvic lymph nodes. Reproductive: Status post hysterectomy. No adnexal masses. Other: No abdominal wall hernia or abnormality.  No abdominopelvic ascites. Musculoskeletal: No acute or significant osseous findings. IMPRESSION: No acute abnormality seen in the abdomen or pelvis. Electronically Signed   By: Lupita Raider M.D.   On: 03/20/2021 14:25   US Abdomen Limited RUQ (LIVER/GB)  Result Date: 03/20/2021 CLINICAL DATA:  Right upper quadrant abdominal pain EXAM: ULTRASOUND ABDOMEN LIMITED RIGHT UPPER QUADRANT COMPARISON:  11/04/2019 CT with contrast FINDINGS: Gallbladder: No gallstones or wall thickening visualized. No sonographic Murphy sign noted by sonographer. Common bile duct: Diameter: 4.5 mm Liver: There is increased echogenicity compatible with hepatic steatosis. No large focal abnormality by ultrasound or intrahepatic biliary dilatation. Portal vein is patent on color Doppler imaging with normal direction of blood flow towards the liver. Other: No free fluid or ascites IMPRESSION: Hepatic steatosis. No other acute finding by ultrasound. Electronically Signed   By: Judie Petit.  Shick M.D.   On: 03/20/2021 11:19    Procedures Procedures   Medications Ordered in ED Medications  morphine 4 MG/ML injection 4 mg (4 mg Intravenous Given 03/20/21 1014)  ondansetron (ZOFRAN) injection 4 mg (4 mg Intravenous Given 03/20/21 1014)  sodium chloride 0.9 % bolus 1,000 mL (0 mLs Intravenous Stopped 03/20/21 1143)  morphine 4 MG/ML injection 4 mg (4 mg Intravenous Given 03/20/21 1308)  iohexol (OMNIPAQUE) 300 MG/ML solution 100 mL (100 mLs Intravenous Contrast Given 03/20/21 1345)  sodium chloride (PF) 0.9 % injection (  Given 03/20/21 1400)  ondansetron (ZOFRAN-ODT) disintegrating tablet 4 mg (4 mg Oral Given 03/20/21 1535)    ED Course  I have reviewed the triage vital signs and the nursing notes.  Pertinent labs & imaging results that were available during my care of the patient were reviewed by me and considered in my medical decision making (see chart for details).    MDM Rules/Calculators/A&P                             70 year old female with a history of atrial fibrillation on Xarelto, depression, dystonia, hyperlipidemia, TIA, chronic pain syndrome who presents with concern for abdominal pain.  DDx includes appendicitis, pancreatitis, cholecystitis, pyelonephritis, nephrolithiasis, diverticulitis, AAA, PUD, perforation.   Initial hx and exam most consistent with biliary pathology and RUQ [place] ordered showing hepatic steatosis without other abnormalities.  Given severity of pain , ordered CT abdomen to evaluate for other abnormalities (diverticulitis, perforation, obstruction) which shows no acute abnormalities. Suspect like PUD/gastritis. Recommend protonix and GI and PCP follow up . Patient discharged in stable condition with understanding of reasons to return.    Final Clinical Impression(s) / ED Diagnoses Final diagnoses:  RUQ abdominal pain  Epigastric pain  Peptic ulcer disease    Rx / DC Orders ED Discharge Orders          Ordered    pantoprazole (PROTONIX) 20 MG tablet  Daily        03/20/21 1518    ondansetron (ZOFRAN ODT) 4 MG disintegrating tablet  Every 8 hours PRN        03/20/21 1528             Alvira Monday, MD 03/21/21 [phone]

## 2021-04-04 ENCOUNTER — Encounter: Payer: Self-pay | Admitting: Family Medicine

## 2021-04-08 ENCOUNTER — Other Ambulatory Visit: Payer: Self-pay

## 2021-04-08 MED ORDER — PANTOPRAZOLE SODIUM 20 MG PO TBEC: DELAYED_RELEASE_TABLET | ORAL | 0 refills | Status: DC

## 2021-04-19 ENCOUNTER — Encounter: Payer: Self-pay | Admitting: Physician Assistant

## 2021-04-19 ENCOUNTER — Ambulatory Visit: Payer: Medicare HMO | Admitting: Physician Assistant

## 2021-04-19 VITALS — BP 80/44 | HR 52 | Ht 67.0 in | Wt 145.0 lb

## 2021-04-19 DIAGNOSIS — K2970 Gastritis, unspecified, without bleeding: Secondary | ICD-10-CM

## 2021-04-19 DIAGNOSIS — K3184 Gastroparesis: Secondary | ICD-10-CM | POA: Diagnosis not present

## 2021-04-19 DIAGNOSIS — R1013 Epigastric pain: Secondary | ICD-10-CM | POA: Diagnosis not present

## 2021-04-19 MED ORDER — PANTOPRAZOLE SODIUM 20 MG PO TBEC: 20.0000 mg | DELAYED_RELEASE_TABLET | Freq: Two times a day (BID) | ORAL | 3 refills | Status: DC

## 2021-04-19 NOTE — Progress Notes (Signed)
Subjective:    Patient ID: Danielle Rocha, female    DOB: 1951-06-06, 70 y.o.   MRN: [id number]  HPI Mimi is a pleasant 70 year old white female, established with Dr. Ardis Hughs.  She comes in today after ER visit on 03/20/2021 with epigastric pain.  She says she had actually been hurting for 3 to 4 weeks before she went to the emergency room but felt worse that day.  This is been associated with nausea but no vomiting.  She says she had been taking omeprazole 20 mg twice daily recently, had not had any other new medications or medication changes, no regular use of aspirin or NSAIDs. Labs were unremarkable including CBC lipase and c-Met She had CT imaging with contrast which showed no acute findings. Upper abdominal ultrasound consistent with hepatic steatosis. She was switched to Protonix 20 mg p.o. twice daily, which she had taken in the past with good response, and says she has been feeling much better since then. She is not having any current ongoing discomfort, has been eating without difficulty. She does at times have a sensation of filling up quickly and has been eating smaller amounts. Her weight is down about 15 pounds this year, but again she attributes this to eating smaller amounts. She does have mild constipation, says she has been off of her fiber supplement which she will resume and usually drinks a lot of water.  She did have gastric emptying scan done in 2021 which showed delayed emptying with 59% retention at 4 hours EGD and colonoscopy were done April 2021 she was noted to have mild gastritis and a small hiatal hernia, no evidence of H. pylori.  Colonoscopy- multiple diverticuli and internal hemorrhoids, no polyps.  She had been tried on metoclopramide at one point in the past and says she had a bad reaction, allergy section list suicidal thoughts. Patient also has a movement disorder with dystonia, history of atrial fibrillation on Xarelto, depression, chronic pain syndrome,  and prior history of TIA.  Review of Systems Pertinent positive and negative review of systems were noted in the above HPI section.  All other review of systems was otherwise negative.   Outpatient Encounter Medications as of 04/19/2021  Medication Sig   acetaminophen (TYLENOL) 500 MG tablet Take 1,000 mg by mouth every 6 (six) hours as needed for mild pain, moderate pain, fever or headache.   atorvastatin (LIPITOR) 20 MG tablet Take 1 tablet (20 mg total) by mouth daily.   buPROPion (WELLBUTRIN XL) 300 MG 24 hr tablet Take 900 mg by mouth daily.   cephALEXin (KEFLEX) 250 MG capsule Take 250 mg by mouth daily. Takes daily for frequent UTIs.   cholecalciferol (VITAMIN D) 1000 units tablet Take 1,000 Units by mouth daily.   co-enzyme Q-10 30 MG capsule Take 30 mg by mouth daily.   Cyanocobalamin (B-12 PO) Take by mouth.   FERROUS SULFATE PO Take by mouth.   flecainide (TAMBOCOR) 100 MG tablet Take 1 tablet by mouth twice daily   gabapentin (NEURONTIN) 300 MG capsule Take 300 mg by mouth at bedtime.    LORazepam (ATIVAN) 0.5 MG tablet Take 1 mg by mouth at bedtime. Takes 1 in the am and 2 at night   LORazepam (ATIVAN) 1 MG tablet Take 1 mg by mouth 3 (three) times daily.   Melatonin 3 MG TABS Take 2 tablets by mouth at bedtime.    Multiple Vitamin (MULTIVITAMIN WITH MINERALS) TABS tablet Take 1 tablet by mouth daily.   ondansetron (  ZOFRAN ODT) 4 MG disintegrating tablet Take 1 tablet (4 mg total) by mouth every 8 (eight) hours as needed for nausea or vomiting.   propranolol (INDERAL) 20 MG tablet Take 1 tablet (20 mg total) by mouth 2 (two) times daily.   sertraline (ZOLOFT) 50 MG tablet Take 1 tablet (50 mg total) by mouth daily.   traZODone (DESYREL) 150 MG tablet Take 150 mg by mouth at bedtime.    XARELTO 20 MG TABS tablet TAKE 1 TABLET BY MOUTH ONCE DAILY WITH SUPPER   [DISCONTINUED] pantoprazole (PROTONIX) 20 MG tablet Take 2 tablets daily.   pantoprazole (PROTONIX) 20 MG tablet Take 1  tablet (20 mg total) by mouth 2 (two) times daily before a meal. Take 2 tablets daily.   [DISCONTINUED] buPROPion (WELLBUTRIN XL) 150 MG 24 hr tablet Take 450 mg by mouth every morning.   No facility-administered encounter medications on file as of 04/19/2021.   Allergies  Allergen Reactions   Reglan [Metoclopramide] Other (See Comments)    Suicidal thoughts   Ciprofloxacin Diarrhea   Clindamycin/Lincomycin Diarrhea   Vibramycin [Doxycycline] Diarrhea   Macrobid [Nitrofurantoin Monohyd Macro] Rash    Rash on lip   Sulfa Antibiotics Rash   Patient Active Problem List   Diagnosis Date Noted   Involuntary movements 02/14/2020   Depression with anxiety 02/14/2020   Aortic atherosclerosis ([place]) 02/07/2020   Delayed gastric emptying 02/07/2020   Dystonia    Restless legs 07/27/2018   Recurrent UTI 10/07/2016   Major depression in full remission ([place]) 09/02/2016   GERD (gastroesophageal reflux disease) 01/30/2016   History of SI/intentional drug overdose 11/22/2015   Atrial fibrillation (HCC)    Chronic pain syndrome    Hyperlipidemia    Social History   Socioeconomic History   Marital status: Married    Spouse name: Not on file   Number of children: Not on file   Years of education: Not on file   Highest education level: Not on file  Occupational History   Occupation: retired  Tobacco Use   Smoking status: Never   Smokeless tobacco: Never  Vaping Use   Vaping Use: Never used  Substance and Sexual Activity   Alcohol use: No    Alcohol/week: 0.0 standard drinks   Drug use: No   Sexual activity: Not Currently    Partners: Male  Other Topics Concern   Not on file  Social History Narrative   Family: Married. Husband works as Geophysicist/field seismologist for [employer]. 2 children from previous marriage 1 from current. Son [name] lives with them and has down's syndrome.       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 Moved to [place] from [id number] and became  disabled in that time frame due to hip/back issues.       Hobbies: time with son   Social Determinants of Radio broadcast assistant Strain: Low Risk    Difficulty of Paying Living Expenses: Not hard at all  Food Insecurity: No Food Insecurity   Worried About Charity fundraiser in the Last Year: Never true   Arboriculturist in the Last Year: Never true  Transportation Needs: No Transportation Needs   Lack of Transportation (Medical): No   Lack of Transportation (Non-Medical): No  Physical Activity: Inactive   Days of Exercise per Week: 0 days   Minutes of Exercise per Session: 0 min  Stress: Stress Concern Present   Feeling of Stress : To some extent  Social  Connections: Socially Isolated   Frequency of Communication with Friends and Family: Once a week   Frequency of Social Gatherings with Friends and Family: Once a week   Attends Religious Services: Never   Marine scientist or Organizations: No   Attends Music therapist: Never   Marital Status: Married  Human resources officer Violence: Not At Risk   Fear of Current or Ex-Partner: No   Emotionally Abused: No   Physically Abused: No   Sexually Abused: No    Ms. Labrador's family history includes Alcohol abuse in her father and mother; Cancer in her brother and brother; Diabetes in her brother; Hypertension in her father; Pancreatic cancer in her father; Skin cancer in her brother.      Objective:    Vitals:   04/19/21 1101  BP: (!) 80/44  Pulse: (!) 52    Physical Exam Well-developed well-nourished older WF in no acute distress.  Height, Weight, 145 BMI 22.7  HEENT; nontraumatic normocephalic, EOMI, PE R LA, sclera anicteric. Oropharynx;not done Neck; supple, no JVD Cardiovascular; regular rate and rhythm with S1-S2, no murmur rub or gallop Pulmonary; Clear bilaterally Abdomen; soft, nontender, nondistended, no palpable mass or hepatosplenomegaly, bowel sounds are active Rectal; not done  today Skin; benign exam, no jaundice rash or appreciable lesions Extremities; no clubbing cyanosis or edema skin warm and dry Neuro/Psych; alert and oriented x4, grossly nonfocal mood and affect appropriate , involuntary movement of neck       Assessment & Plan:   #27 70 year old white female seen today after recent ER visit with complaints of epigastric pain which she had had for 3 to 4 weeks prior to the ER visit. Work-up there was reassuring with negative CT scan and negative ultrasound other than hepatic steatosis and labs were unremarkable. Her PPI was switched from omeprazole to Protonix and she is feeling much better at this time. She has noted some intermittent early satiety, has had a gradual weight loss of 15 pounds this year and knows that she has been eating smaller meals as she is more comfortable with smaller meals. Patient has previously documented gastroparesis in 2021.  She has not been on medication as she had had prior reaction to metoclopramide. Also with previously documented gastritis at EGD 2021.  Current symptoms may be combination of gastropathy and gastroparesis/chronic  #2 hepatic steatosis 3.  Colon cancer screening-up-to-date with negative colonoscopy April 2021 4.  Diverticulosis 5.  Chronic pain syndrome 6.  Dystonia 7.  Prior history of TIA 8.  GERD #9 Atrial fibrillation on chronic Xarelto  Plan; continue Protonix 20 mg p.o. AC breakfast and AC dinner, refills sent Start gastroparesis diet, she was provided with a copy and asked to follow step 3 precautions.  Also advised she add 1-2 snacks daily in order to maintain her weight   She will follow-up with Dr. Ardis Hughs or myself on an as-needed basis, happy to see her for recurrent issues.    Alexina Niccoli Genia Harold PA-C 04/19/2021   Cc: Marin Olp, MD

## 2021-04-19 NOTE — Patient Instructions (Signed)
We have sent the following medications to your pharmacy for you to pick up at your convenience: Pantoprazole 20 mg twice daily 30-60 minutes before breakfast and dinner.   Follow up with Dr. Christella Hartigan as needed.   If you are age 69 or older, your body mass index should be between 23-30. Your Body mass index is 22.71 kg/m. If this is out of the aforementioned range listed, please consider follow up with your Primary Care Provider.  If you are age 54 or younger, your body mass index should be between 19-25. Your Body mass index is 22.71 kg/m. If this is out of the aformentioned range listed, please consider follow up with your Primary Care Provider.   __________________________________________________________  The [facility] providers would like to encourage you to use [facility] to communicate with providers for non-urgent requests or questions.  Due to long hold times on the telephone, sending your provider a message by [facility] may be a faster and more efficient way to get a response.  Please allow 48 business hours for a response.  Please remember that this is for non-urgent requests.

## 2021-04-23 DIAGNOSIS — R69 Illness, unspecified: Secondary | ICD-10-CM | POA: Diagnosis not present

## 2021-04-29 DIAGNOSIS — I10 Essential (primary) hypertension: Secondary | ICD-10-CM | POA: Diagnosis not present

## 2021-04-29 DIAGNOSIS — G8929 Other chronic pain: Secondary | ICD-10-CM | POA: Diagnosis not present

## 2021-04-29 DIAGNOSIS — K219 Gastro-esophageal reflux disease without esophagitis: Secondary | ICD-10-CM | POA: Diagnosis not present

## 2021-04-29 DIAGNOSIS — D6869 Other thrombophilia: Secondary | ICD-10-CM | POA: Diagnosis not present

## 2021-04-29 DIAGNOSIS — G4700 Insomnia, unspecified: Secondary | ICD-10-CM | POA: Diagnosis not present

## 2021-04-29 DIAGNOSIS — R69 Illness, unspecified: Secondary | ICD-10-CM | POA: Diagnosis not present

## 2021-04-29 DIAGNOSIS — E785 Hyperlipidemia, unspecified: Secondary | ICD-10-CM | POA: Diagnosis not present

## 2021-04-29 DIAGNOSIS — K5900 Constipation, unspecified: Secondary | ICD-10-CM | POA: Diagnosis not present

## 2021-04-29 DIAGNOSIS — I4891 Unspecified atrial fibrillation: Secondary | ICD-10-CM | POA: Diagnosis not present

## 2021-04-29 NOTE — Progress Notes (Signed)
I agree with the above note, plan 

## 2021-05-10 ENCOUNTER — Other Ambulatory Visit: Payer: Self-pay | Admitting: Family Medicine

## 2021-05-13 DIAGNOSIS — R69 Illness, unspecified: Secondary | ICD-10-CM | POA: Diagnosis not present

## 2021-05-16 ENCOUNTER — Telehealth: Payer: Self-pay

## 2021-05-16 ENCOUNTER — Other Ambulatory Visit: Payer: Self-pay | Admitting: Family Medicine

## 2021-05-16 NOTE — Telephone Encounter (Signed)
.   Encourage patient to contact the pharmacy for refills or they can request refills through [facility]  LAST APPOINTMENT DATE:  Please schedule appointment if longer than 1 year  NEXT APPOINTMENT DATE:  MEDICATION:XARELTO 20 MG TABS tablet  Is the patient out of medication?   PHARMACY:[facility] [address] [place], [place] - [address]  Please advise  Can we send in enough until her apt on 9/15

## 2021-05-17 MED ORDER — RIVAROXABAN 20 MG PO TABS: 20.0000 mg | ORAL_TABLET | Freq: Every day | ORAL | 5 refills | Status: DC

## 2021-05-17 NOTE — Telephone Encounter (Signed)
Refill sent to pharmacy.   

## 2021-05-20 ENCOUNTER — Ambulatory Visit: Payer: Medicare HMO | Admitting: Neurology

## 2021-05-21 DIAGNOSIS — R69 Illness, unspecified: Secondary | ICD-10-CM | POA: Diagnosis not present

## 2021-05-28 DIAGNOSIS — F331 Major depressive disorder, recurrent, moderate: Secondary | ICD-10-CM | POA: Diagnosis not present

## 2021-05-28 DIAGNOSIS — R69 Illness, unspecified: Secondary | ICD-10-CM | POA: Diagnosis not present

## 2021-05-31 DIAGNOSIS — Z0100 Encounter for examination of eyes and vision without abnormal findings: Secondary | ICD-10-CM | POA: Diagnosis not present

## 2021-06-03 DIAGNOSIS — R69 Illness, unspecified: Secondary | ICD-10-CM | POA: Diagnosis not present

## 2021-06-03 DIAGNOSIS — F331 Major depressive disorder, recurrent, moderate: Secondary | ICD-10-CM | POA: Diagnosis not present

## 2021-06-06 ENCOUNTER — Emergency Department (HOSPITAL_COMMUNITY): Payer: Medicare HMO

## 2021-06-06 ENCOUNTER — Encounter (HOSPITAL_COMMUNITY): Payer: Self-pay | Admitting: Emergency Medicine

## 2021-06-06 ENCOUNTER — Ambulatory Visit (INDEPENDENT_AMBULATORY_CARE_PROVIDER_SITE_OTHER): Payer: Medicare HMO | Admitting: Family Medicine

## 2021-06-06 ENCOUNTER — Emergency Department (HOSPITAL_COMMUNITY)
Admission: EM | Admit: 2021-06-06 | Discharge: 2021-06-06 | Disposition: A | Discharge status: Home / Self Care | Payer: Medicare HMO | Attending: Emergency Medicine | Admitting: Emergency Medicine

## 2021-06-06 ENCOUNTER — Other Ambulatory Visit: Payer: Self-pay

## 2021-06-06 ENCOUNTER — Encounter: Payer: Self-pay | Admitting: Family Medicine

## 2021-06-06 VITALS — BP 100/52 | HR 67 | Temp 98.7°F | Ht 67.0 in | Wt 147.0 lb

## 2021-06-06 DIAGNOSIS — Y900 Blood alcohol level of less than 20 mg/100 ml: Secondary | ICD-10-CM | POA: Diagnosis not present

## 2021-06-06 DIAGNOSIS — R251 Tremor, unspecified: Secondary | ICD-10-CM | POA: Insufficient documentation

## 2021-06-06 DIAGNOSIS — E041 Nontoxic single thyroid nodule: Secondary | ICD-10-CM | POA: Diagnosis not present

## 2021-06-06 DIAGNOSIS — Z20822 Contact with and (suspected) exposure to covid-19: Secondary | ICD-10-CM | POA: Diagnosis not present

## 2021-06-06 DIAGNOSIS — R42 Dizziness and giddiness: Secondary | ICD-10-CM | POA: Insufficient documentation

## 2021-06-06 DIAGNOSIS — Z79899 Other long term (current) drug therapy: Secondary | ICD-10-CM | POA: Insufficient documentation

## 2021-06-06 DIAGNOSIS — R4789 Other speech disturbances: Secondary | ICD-10-CM | POA: Diagnosis not present

## 2021-06-06 DIAGNOSIS — I4891 Unspecified atrial fibrillation: Secondary | ICD-10-CM | POA: Diagnosis not present

## 2021-06-06 DIAGNOSIS — F3342 Major depressive disorder, recurrent, in full remission: Secondary | ICD-10-CM

## 2021-06-06 DIAGNOSIS — Z23 Encounter for immunization: Secondary | ICD-10-CM | POA: Diagnosis not present

## 2021-06-06 DIAGNOSIS — Z7901 Long term (current) use of anticoagulants: Secondary | ICD-10-CM | POA: Diagnosis not present

## 2021-06-06 DIAGNOSIS — I480 Paroxysmal atrial fibrillation: Secondary | ICD-10-CM | POA: Diagnosis not present

## 2021-06-06 DIAGNOSIS — R2689 Other abnormalities of gait and mobility: Secondary | ICD-10-CM | POA: Insufficient documentation

## 2021-06-06 DIAGNOSIS — R69 Illness, unspecified: Secondary | ICD-10-CM | POA: Diagnosis not present

## 2021-06-06 DIAGNOSIS — E785 Hyperlipidemia, unspecified: Secondary | ICD-10-CM

## 2021-06-06 DIAGNOSIS — I700 Atherosclerosis of aorta: Secondary | ICD-10-CM

## 2021-06-06 DIAGNOSIS — R351 Nocturia: Secondary | ICD-10-CM | POA: Diagnosis not present

## 2021-06-06 LAB — DIFFERENTIAL
Abs Immature Granulocytes: 0.03 10*3/uL (ref 0.00–0.07)
Basophils Absolute: 0 10*3/uL (ref 0.0–0.1)
Basophils Relative: 0 %
Eosinophils Absolute: 0.1 10*3/uL (ref 0.0–0.5)
Eosinophils Relative: 1 %
Immature Granulocytes: 0 %
Lymphocytes Relative: 22 %
Lymphs Abs: 2 10*3/uL (ref 0.7–4.0)
Monocytes Absolute: 1 10*3/uL (ref 0.1–1.0)
Monocytes Relative: 11 %
Neutro Abs: 6.2 10*3/uL (ref 1.7–7.7)
Neutrophils Relative %: 66 %

## 2021-06-06 LAB — COMPREHENSIVE METABOLIC PANEL
ALT: 16 U/L (ref 0–35)
ALT: 18 U/L (ref 0–44)
AST: 17 U/L (ref 0–37)
AST: 19 U/L (ref 15–41)
Albumin: 4.1 g/dL (ref 3.5–5.2)
Albumin: 4.1 g/dL (ref 3.5–5.0)
Alkaline Phosphatase: 64 U/L (ref 39–117)
Alkaline Phosphatase: 62 U/L (ref 38–126)
Anion gap: 8 (ref 5–15)
BUN: 11 mg/dL (ref 6–23)
BUN: 12 mg/dL (ref 8–23)
CO2: 29 mEq/L (ref 19–32)
CO2: 28 mmol/L (ref 22–32)
Calcium: 9.3 mg/dL (ref 8.4–10.5)
Calcium: 9.2 mg/dL (ref 8.9–10.3)
Chloride: 103 mEq/L (ref 96–112)
Chloride: 102 mmol/L (ref 98–111)
Creatinine, Ser: 0.84 mg/dL (ref 0.40–1.20)
Creatinine, Ser: 0.86 mg/dL (ref 0.44–1.00)
GFR, Estimated: >60 mL/min (ref >60)
GFR: 71.05 mL/min (ref >60.00)
Glucose, Bld: 82 mg/dL (ref 70–99)
Glucose, Bld: 105 mg/dL — ABNORMAL HIGH (ref 70–99)
Potassium: 3.9 mEq/L (ref 3.5–5.1)
Potassium: 3.7 mmol/L (ref 3.5–5.1)
Sodium: 138 mEq/L (ref 135–145)
Sodium: 138 mmol/L (ref 135–145)
Total Bilirubin: 0.5 mg/dL (ref 0.2–1.2)
Total Bilirubin: 0.6 mg/dL (ref 0.3–1.2)
Total Protein: 6.7 g/dL (ref 6.0–8.3)
Total Protein: 6.8 g/dL (ref 6.5–8.1)

## 2021-06-06 LAB — APTT: aPTT: 28 seconds (ref 24–36)

## 2021-06-06 LAB — CBC WITH DIFFERENTIAL/PLATELET
Basophils Absolute: 0.1 10*3/uL (ref 0.0–0.1)
Basophils Relative: 0.8 % (ref 0.0–3.0)
Eosinophils Absolute: 0.1 10*3/uL (ref 0.0–0.7)
Eosinophils Relative: 1.1 % (ref 0.0–5.0)
HCT: 42.2 % (ref 36.0–46.0)
Hemoglobin: 14.1 g/dL (ref 12.0–15.0)
Lymphocytes Relative: 25.8 % (ref 12.0–46.0)
Lymphs Abs: 1.8 10*3/uL (ref 0.7–4.0)
MCHC: 33.4 g/dL (ref 30.0–36.0)
MCV: 85.6 fl (ref 78.0–100.0)
Monocytes Absolute: 0.7 10*3/uL (ref 0.1–1.0)
Monocytes Relative: 10.2 % (ref 3.0–12.0)
Neutro Abs: 4.3 10*3/uL (ref 1.4–7.7)
Neutrophils Relative %: 62.1 % (ref 43.0–77.0)
Platelets: 229 10*3/uL (ref 150.0–400.0)
RBC: 4.93 Mil/uL (ref 3.87–5.11)
RDW: 13.4 % (ref 11.5–15.5)
WBC: 6.9 10*3/uL (ref 4.0–10.5)

## 2021-06-06 LAB — CBC
HCT: 38.9 % (ref 36.0–46.0)
Hemoglobin: 13.5 g/dL (ref 12.0–15.0)
MCH: 29.4 pg (ref 26.0–34.0)
MCHC: 34.7 g/dL (ref 30.0–36.0)
MCV: 84.7 fL (ref 80.0–100.0)
Platelets: 218 10*3/uL (ref 150–400)
RBC: 4.59 MIL/uL (ref 3.87–5.11)
RDW: 12.6 % (ref 11.5–15.5)
WBC: 9.4 10*3/uL (ref 4.0–10.5)
nRBC: 0 % (ref 0.0–0.2)

## 2021-06-06 LAB — I-STAT CHEM 8, ED
BUN: 13 mg/dL (ref 8–23)
Calcium, Ion: 1.15 mmol/L (ref 1.15–1.40)
Chloride: 102 mmol/L (ref 98–111)
Creatinine, Ser: 0.8 mg/dL (ref 0.44–1.00)
Glucose, Bld: 92 mg/dL (ref 70–99)
HCT: 37 % (ref 36.0–46.0)
Hemoglobin: 12.6 g/dL (ref 12.0–15.0)
Potassium: 5.4 mmol/L — ABNORMAL HIGH (ref 3.5–5.1)
Sodium: 138 mmol/L (ref 135–145)
TCO2: 29 mmol/L (ref 22–32)

## 2021-06-06 LAB — LIPID PANEL
Cholesterol: 197 mg/dL (ref 0–200)
HDL: 77.2 mg/dL (ref >39.00)
LDL Cholesterol: 96 mg/dL (ref 0–99)
NonHDL: 119.79
Total CHOL/HDL Ratio: 3
Triglycerides: 118 mg/dL (ref 0.0–149.0)
VLDL: 23.6 mg/dL (ref 0.0–40.0)

## 2021-06-06 LAB — TSH: TSH: 0.73 u[IU]/mL (ref 0.35–5.50)

## 2021-06-06 LAB — ETHANOL: Alcohol, Ethyl (B): <10 mg/dL (ref <10)

## 2021-06-06 LAB — RESP PANEL BY RT-PCR (FLU A&B, COVID) ARPGX2
Influenza A by PCR: NEGATIVE
Influenza B by PCR: NEGATIVE
SARS Coronavirus 2 by RT PCR: NEGATIVE

## 2021-06-06 LAB — PROTIME-INR
INR: 1.1 (ref 0.8–1.2)
Prothrombin Time: 14 seconds (ref 11.4–15.2)

## 2021-06-06 MED ORDER — LORAZEPAM 2 MG/ML IJ SOLN
1.0000 mg | Freq: Once | INTRAMUSCULAR | Status: AC
  Administered 2021-06-06: 1 mg via INTRAVENOUS
  Filled 2021-06-06: qty 1

## 2021-06-06 MED ORDER — FLECAINIDE ACETATE 100 MG PO TABS: 100.0000 mg | ORAL_TABLET | Freq: Two times a day (BID) | ORAL | 3 refills | Status: DC

## 2021-06-06 MED ORDER — RIVAROXABAN 20 MG PO TABS: 20.0000 mg | ORAL_TABLET | Freq: Every day | ORAL | 3 refills | Status: DC

## 2021-06-06 MED ORDER — LORAZEPAM 2 MG/ML IJ SOLN: 1.0000 mg | Freq: Once | INTRAMUSCULAR | Status: DC | PRN

## 2021-06-06 MED ORDER — IOHEXOL 350 MG/ML SOLN
75.0000 mL | Freq: Once | INTRAVENOUS | Status: AC | PRN
  Administered 2021-06-06: 75 mL via INTRAVENOUS

## 2021-06-06 NOTE — ED Triage Notes (Signed)
Per pt, states she has a history of TIA's-states she started having some garbled speech 3 days ago but really didn't pay attention to it-states today she was having trouble walking and fell at [facility] she was having speech issues on and off today-history of dystonia-states symptoms resemble TIA symptoms she has had in past

## 2021-06-06 NOTE — Progress Notes (Signed)
Phone [phone] In person visit   Subjective:   Danielle Rocha is a 70 y.o. year old very pleasant female patient who presents for/with See problem oriented charting Chief Complaint  Patient presents with   Medication Refill   Atrial Fibrillation   Hyperlipidemia   Gastroesophageal Reflux    This visit occurred during the SARS-CoV-2 public health emergency.  Safety protocols were in place, including screening questions prior to the visit, additional usage of staff PPE, and extensive cleaning of exam room while observing appropriate contact time as indicated for disinfecting solutions.   Past Medical History-  Patient Active Problem List   Diagnosis Date Noted   Dystonia     Priority: High   Major depression in full remission (HCC) 09/02/2016    Priority: High   History of SI/intentional drug overdose 11/22/2015    Priority: High   Atrial fibrillation (HCC)     Priority: High   Chronic pain syndrome     Priority: High   Aortic atherosclerosis (HCC) 02/07/2020    Priority: Medium   Delayed gastric emptying 02/07/2020    Priority: Medium   Restless legs 07/27/2018    Priority: Medium   Recurrent UTI 10/07/2016    Priority: Medium   Hyperlipidemia     Priority: Medium   GERD (gastroesophageal reflux disease) 01/30/2016    Priority: Low   Involuntary movements 02/14/2020   Depression with anxiety 02/14/2020    Medications- reviewed and updated Current Outpatient Medications  Medication Sig Dispense Refill   acetaminophen (TYLENOL) 500 MG tablet Take 1,000 mg by mouth every 6 (six) hours as needed for mild pain, moderate pain, fever or headache.     atorvastatin (LIPITOR) 20 MG tablet Take 1 tablet (20 mg total) by mouth daily. 30 tablet 5   cephALEXin (KEFLEX) 250 MG capsule Take 250 mg by mouth daily. Takes daily for frequent UTIs.     cholecalciferol (VITAMIN D) 1000 units tablet Take 1,000 Units by mouth daily.     co-enzyme Q-10 30 MG capsule Take 30 mg by  mouth daily.     Cyanocobalamin (B-12 PO) Take by mouth.     gabapentin (NEURONTIN) 300 MG capsule Take 300 mg by mouth at bedtime.      LORazepam (ATIVAN) 1 MG tablet Take 1 mg by mouth 3 (three) times daily.     Melatonin 3 MG TABS Take 2 tablets by mouth at bedtime.      Multiple Vitamin (MULTIVITAMIN WITH MINERALS) TABS tablet Take 1 tablet by mouth daily.     ondansetron (ZOFRAN ODT) 4 MG disintegrating tablet Take 1 tablet (4 mg total) by mouth every 8 (eight) hours as needed for nausea or vomiting. 20 tablet 0   pantoprazole (PROTONIX) 20 MG tablet Take 1 tablet (20 mg total) by mouth 2 (two) times daily before a meal. Take 2 tablets daily. 180 tablet 3   sertraline (ZOLOFT) 50 MG tablet Take 1 tablet (50 mg total) by mouth daily. 30 tablet 6   traZODone (DESYREL) 150 MG tablet Take 150 mg by mouth at bedtime.      FERROUS SULFATE PO Take by mouth. (Patient not taking: Reported on 06/06/2021)     flecainide (TAMBOCOR) 100 MG tablet Take 1 tablet (100 mg total) by mouth 2 (two) times daily. 180 tablet 3   rivaroxaban (XARELTO) 20 MG TABS tablet Take 1 tablet (20 mg total) by mouth daily with supper. 90 tablet 3   No current facility-administered medications for this visit.  Objective:  BP (!) 100/52   Pulse 67   Temp 98.7 F (37.1 C) (Temporal)   Ht 5\' 7"  (1.702 m)   Wt 147 lb (66.7 kg)   LMP  (LMP Unknown)   SpO2 96%   BMI 23.02 kg/m  Gen: NAD, resting comfortably CV: RRR (sounds regular at moment) no murmurs rubs or gallops Lungs: CTAB no crackles, wheeze, rhonchi Ext: no edema Skin: warm, dry Neuro: constant head tremor during visit    Assessment and Plan   #Dystonia/involuntary movements- follows with  Dr. and also seeing a new therapist [place] - has really enjoyed working with her. Had a long stretch of issues between march 1 and until august- even had 1 ER visit- took 4 ativan to try to reduce issues but led to a fall.  -we started on sinemet without  improvement 0.5 tablets twice daily- will increase to full tablet -Per Dr. September- Also started on ativan 1 mg once or twice a day for the most part- most helpful she has had but still imperfect  #Delayed gastric emptying S: severe nausea issues. Had GI workup. Patient states she stopped hydrocodone 5/325 once per day with concern of gastroparesis with ongoing nausea issues. She is eating 6 small meals a day and that is helping.   01/02/20 biopsies on EGD normal- no H. PYlori.   Gastric emptying study 01/23/20  "27% emptied at 1 hr ( normal >= 10%) 34% emptied at 2 hr ( normal >= 40%) 48% emptied at 3 hr ( normal >= 70%) 59% emptied at 4 hr ( normal >= 90%)   IMPRESSION: Delayed gastric emptying study." A/P: unfortunately had bad gastritis episode- now on pantoprazole 20 mg BID. Also for gastroparesis- doing a diet and finds helpful but is challenging. Overall stable- continue to monitor   #Atrial fibrillation S:Compliant with Xarelto for anticoagulation.  Flecainide as an antiarrhythmic.  Does not follow with cardiology regularly A/P: Appropriately anticoagulated and rate controlled-continue current medication   #Chronic pain syndrome S:On disability due to chronic pain syndrome predating being cared for at [facility]. Extensive prior orthopedic workup. issues mainly in low back and hips.   April 2021 Stopped hydrocodone 5/325 once per day-prior would take  full tablet once or splits and takes twice a day. She did not note significant increase in pain though dystonia is painful. Gastroparesis led to stopping this.  -Pain worsened with PT A/P: Pain is still a big factor for her but did not tolerate hydrocodone due to worsening gastroparesis.  She is currently on Tylenol but certainly still suffers with chronic pain. For now continue current treatment plan   #Major depressive disorder S: currently on sertraline 50mg  and wellbutrin 150 XL 3 in the AM as well. Also on gabepentin 300mg  at night,  trazodone 150 mg before bed  working with psychiatry Dr. May 2021.   Per Dr. - Also started on ativan half mg in Am and 1 mg at night for dystonia  Overall stable but fluctuates- even with medicine does not perfectly control symptoms.  A/P: poor control- has upcoming visit to discuss medication changes- we will mnot make any changes at this time. Check TSH, CBC- also reports goiter history so reasonable to check -still dealing with chronic pain which is a trigger    #Hyperlipidemia/aortic atherosclerosis- incidental finding on 11/04/19 CT S:compliant atorvastatin 20mg . LDL goal at least under 100 prefer under 70. She wants to work on lifestyle as much as able Lab Results  Component Value Date  CHOL 197 11/01/2019   HDL 60.00 11/01/2019   LDLCALC 75 10/28/2017   LDLDIRECT 104.0 11/01/2019   TRIG 243.0 (H) 11/01/2019   CHOLHDL 3 11/01/2019  A/P: hopefully stable or improved-  update LDL today. Continue current meds for now- possibly tweak meds to try to get to LDL under 70 given aortic atherosclerosis   #Restless legs S:restless legs has been better with b12 and ferrous sulfate/iron. resolved with iron repletion- has come off due to constipation but no worsening issues A/P: stable- mointor.    %Recurrent UTI- has seen urology. Had cystoscopy. On chronic keflex for prevention   Recommended follow up: advised to see me back for next available CPE Future Appointments  Date Time Provider Department Center  09/09/2021  2:30 PM [facility]    Lab/Order associations:   ICD-10-CM   1. Hyperlipidemia, unspecified hyperlipidemia type  E78.5 CBC with Differential/Platelet    Comprehensive metabolic panel    Lipid panel    TSH    2. Aortic atherosclerosis (HCC)  I70.0 CBC with Differential/Platelet    Comprehensive metabolic panel    Lipid panel    3. Recurrent major depressive disorder, in full remission (HCC) Chronic F33.42     4. Paroxysmal atrial  fibrillation (HCC) Chronic I48.0       Meds ordered this encounter  Medications   flecainide (TAMBOCOR) 100 MG tablet    Sig: Take 1 tablet (100 mg total) by mouth 2 (two) times daily.    Dispense:  180 tablet    Refill:  3   rivaroxaban (XARELTO) 20 MG TABS tablet    Sig: Take 1 tablet (20 mg total) by mouth daily with supper.    Dispense:  90 tablet    Refill:  3   Return precautions advised.  Tana Conch, MD

## 2021-06-06 NOTE — ED Notes (Signed)
Pt states she feels normal now and states her speech is normal.

## 2021-06-06 NOTE — Discharge Instructions (Signed)
Your CT MRI did not show any stroke right now.  Please follow-up with your neurologist  You have a thyroid nodule that can be followed up  Return to ER if you have worse dizziness, trouble speaking

## 2021-06-06 NOTE — Patient Instructions (Addendum)
Health Maintenance Due  Topic Date Due   COVID-19 Vaccine  Recommend omicron booster at pharmacy 01/04/2020   MAMMOGRAM - declines for now 03/24/2020   INFLUENZA VACCINE Done today in office- high dose.  04/22/2021   Please stop by lab before you go If you have mychart- we will send your results within 3 business days of [place] receiving them.  If you do not have mychart- we will call you about results within 5 business days of [place] receiving them.  *please also note that you will see labs on mychart as soon as they post. I will later go in and write notes on them- will say "notes from Dr. Durene Cal"  Recommended follow up: please schedule next available physical-before you leave-this should be 1 year and 1 day out from your last physical.

## 2021-06-06 NOTE — ED Notes (Signed)
Dark green will be sent to lab following POC.

## 2021-06-06 NOTE — ED Provider Notes (Signed)
[facility]-EMERGENCY DEPT Provider Note   CSN: [id number] Arrival date & time: 06/06/21  1625     History Chief Complaint  Patient presents with   Gait Problem    Danielle Rocha is a 70 y.o. female hx of afib on xarelto, HL, HTN, TIA here with trouble walking, fall, trouble speaking.  Patient states that she has been having trouble speaking over the last 2 to 3 days.  She states that occasionally she is forgetful.  She also states that she has trouble walking as well.  She was at [facility] and states that she had a fall.  She denies worsening trouble speaking today.  She states that she has a history of TIA in the past.  Denies any history of stroke  The history is provided by the patient.      Past Medical History:  Diagnosis Date   Arthritis    DJD, low back, thumb   Atrial fibrillation (HCC)    Xarelto anticoagulation. Flecainide antiarrythmic    Chicken pox    Chronic pain syndrome    On disability. History of bilateral hip pain, low back pain. Gabapentin 400 BID, percocet 1 tablet daily per prior provider.    Colon polyp    awaiting records   Depression    zoloft 100mg , remeron 30mg  per psychiatry. ambien 10mg  per psychiatry to help wtih sleep element.    Dystonia    described as psychogenic dystonia. Pain and twisting from upper chest and up with triggers "wind, creamy food" on TID ativan per psychiatry previously.    Fibromyalgia    GERD (gastroesophageal reflux disease)    omeprazole OTC   Goiter    states multiple imaging tests, has had biopsies   Hyperlipidemia    lovastatin 20mg    Stroke [facility])    TIA (left side of face and body decreased sensitivity than right face and side)   TIA (transient ischemic attack)    Vaginal atrophy    estrace vaginal cream    Patient Active Problem List   Diagnosis Date Noted   Involuntary movements 02/14/2020   Depression with anxiety 02/14/2020   Aortic atherosclerosis (HCC) 02/07/2020   Delayed  gastric emptying 02/07/2020   Dystonia    Restless legs 07/27/2018   Recurrent UTI 10/07/2016   Major depression in full remission (HCC) 09/02/2016   GERD (gastroesophageal reflux disease) 01/30/2016   History of SI/intentional drug overdose 11/22/2015   Atrial fibrillation (HCC)    Chronic pain syndrome    Hyperlipidemia     Past Surgical History:  Procedure Laterality Date   ATRIAL FIBRILLATION ABLATION     BIOPSY THYROID     fusion l4-l5  09/22/1998   LAMINECTOMY     L4-L5   OTHER SURGICAL HISTORY     tennis elbow surgery   piriformis release  09/22/1997   right hip   TONSILLECTOMY AND ADENOIDECTOMY  age 31   VAGINAL HYSTERECTOMY  09/23/1991     OB History     Gravida  3   Para  3   Term  2   Preterm  1   AB      Living  3      SAB      IAB      Ectopic      Multiple      Live Births  3           Family History  Problem Relation Age of Onset   Alcohol abuse  Mother        possible she had stomach cancer as well but unsure   Hypertension Father    Alcohol abuse Father    Pancreatic cancer Father    Cancer Brother        spindle cell RLE   Diabetes Brother    Cancer Brother        tonsil cancer   Skin cancer Brother    Colon cancer Neg Hx    Colon polyps Neg Hx    Stomach cancer Neg Hx     Social History   Tobacco Use   Smoking status: Never   Smokeless tobacco: Never  Vaping Use   Vaping Use: Never used  Substance Use Topics   Alcohol use: No    Alcohol/week: 0.0 standard drinks   Drug use: No    Home Medications Prior to Admission medications   Medication Sig Start Date End Date Taking? Authorizing Provider  acetaminophen (TYLENOL) 500 MG tablet Take 1,000 mg by mouth every 6 (six) hours as needed for mild pain, moderate pain, fever or headache.    [provider]  atorvastatin (LIPITOR) 20 MG tablet Take 1 tablet (20 mg total) by mouth daily. 01/29/21   Shelva Majestic, MD  cephALEXin (KEFLEX) 250 MG capsule  Take 250 mg by mouth daily. Takes daily for frequent UTIs. 10/25/19   [provider]  cholecalciferol (VITAMIN D) 1000 units tablet Take 1,000 Units by mouth daily.    [provider]  co-enzyme Q-10 30 MG capsule Take 30 mg by mouth daily.    [provider]  Cyanocobalamin (B-12 PO) Take by mouth.    [provider]  FERROUS SULFATE PO Take by mouth. Patient not taking: Reported on 06/06/2021    [provider]  flecainide (TAMBOCOR) 100 MG tablet Take 1 tablet (100 mg total) by mouth 2 (two) times daily. 06/06/21   Shelva Majestic, MD  gabapentin (NEURONTIN) 300 MG capsule Take 300 mg by mouth at bedtime.     [provider]  LORazepam (ATIVAN) 1 MG tablet Take 1 mg by mouth 3 (three) times daily. 03/06/21   [provider]  Melatonin 3 MG TABS Take 2 tablets by mouth at bedtime.     [provider]  Multiple Vitamin (MULTIVITAMIN WITH MINERALS) TABS tablet Take 1 tablet by mouth daily.    [provider]  ondansetron (ZOFRAN ODT) 4 MG disintegrating tablet Take 1 tablet (4 mg total) by mouth every 8 (eight) hours as needed for nausea or vomiting. 03/20/21   Alvira Monday, MD  pantoprazole (PROTONIX) 20 MG tablet Take 1 tablet (20 mg total) by mouth 2 (two) times daily before a meal. Take 2 tablets daily. 04/19/21   Esterwood, Amy S, PA-C  rivaroxaban (XARELTO) 20 MG TABS tablet Take 1 tablet (20 mg total) by mouth daily with supper. 06/06/21   Shelva Majestic, MD  sertraline (ZOLOFT) 50 MG tablet Take 1 tablet (50 mg total) by mouth daily. 02/14/20   Levert Feinstein, MD  traZODone (DESYREL) 150 MG tablet Take 150 mg by mouth at bedtime.     [provider]    Allergies    Reglan [metoclopramide], Ciprofloxacin, Clindamycin/lincomycin, Vibramycin [doxycycline], Macrobid [nitrofurantoin monohyd macro], and Sulfa antibiotics  Review of Systems   Review of Systems  Neurological:  Positive for dizziness and  speech difficulty.  All other systems reviewed and are negative.  Physical Exam Updated Vital Signs BP (!) 117/51  Pulse 64   Temp 98.7 F (37.1 C) (Oral)   Resp 18   Ht 5\' 7"  (1.702 m)   Wt 66.7 kg   LMP  (LMP Unknown)   SpO2 95%   BMI 23.02 kg/m   Physical Exam Vitals and nursing note reviewed.  Constitutional:      Appearance: Normal appearance.     Comments: Some resting tremor  HENT:     Head: Normocephalic.     Nose: Nose normal.     Mouth/Throat:     Mouth: Mucous membranes are moist.  Eyes:     Extraocular Movements: Extraocular movements intact.     Pupils: Pupils are equal, round, and reactive to light.  Cardiovascular:     Rate and Rhythm: Normal rate and regular rhythm.     Pulses: Normal pulses.     Heart sounds: Normal heart sounds.  Pulmonary:     Effort: Pulmonary effort is normal.     Breath sounds: Normal breath sounds.  Abdominal:     General: Abdomen is flat.     Palpations: Abdomen is soft.  Musculoskeletal:        General: Normal range of motion.     Cervical back: Normal range of motion and neck supple.  Skin:    General: Skin is warm.     Capillary Refill: Capillary refill takes less than 2 seconds.  Neurological:     Mental Status: She is alert.     Comments: Patient has resting tremors.  Patient has wide-based gait.  Able to do finger-to-nose.  Normal strength and sensation bilateral arms and legs.  Psychiatric:        Behavior: Behavior normal.    ED Results / Procedures / Treatments   Labs (all labs ordered are listed, but only abnormal results are displayed) Labs Reviewed  COMPREHENSIVE METABOLIC PANEL - Abnormal; Notable for the following components:      Result Value   Glucose, Bld 105 (*)    All other components within normal limits  I-STAT CHEM 8, ED - Abnormal; Notable for the following components:   Potassium 5.4 (*)    All other components within normal limits  RESP PANEL BY RT-PCR (FLU A&B, COVID) ARPGX2  ETHANOL   PROTIME-INR  APTT  CBC  DIFFERENTIAL  RAPID URINE DRUG SCREEN, HOSP PERFORMED  URINALYSIS, ROUTINE W REFLEX MICROSCOPIC    EKG None  Radiology CT Angio Head W or Wo Contrast  Result Date: 06/06/2021 CLINICAL DATA:  Nonspecific dizziness.  TIA.  Speech disturbance. EXAM: CT ANGIOGRAPHY HEAD AND NECK TECHNIQUE: Multidetector CT imaging of the head and neck was performed using the standard protocol during bolus administration of intravenous contrast. Multiplanar CT image reconstructions and MIPs were obtained to evaluate the vascular anatomy. Carotid stenosis measurements (when applicable) are obtained utilizing NASCET criteria, using the distal internal carotid diameter as the denominator. CONTRAST:  30mL OMNIPAQUE IOHEXOL 350 MG/ML SOLN COMPARISON:  None. FINDINGS: CT HEAD FINDINGS Brain: Mild age related volume loss. No evidence of old or acute focal infarction, mass lesion, hemorrhage, hydrocephalus or extra-axial collection. Vascular: No abnormal vascular finding. Skull: Normal Sinuses: Clear Orbits: Normal Review of the MIP images confirms the above findings CTA NECK FINDINGS Aortic arch: Mild aortic atherosclerosis. Probable pericardial fluid in the superior recess. Right carotid system: Common carotid artery widely patent to the bifurcation. No carotid bifurcation atherosclerotic disease. No stenosis. Cervical ICA widely patent. Left carotid system: Common carotid artery widely patent to the bifurcation. No bifurcation atherosclerotic  disease. Cervical ICA widely patent. Vertebral arteries: Both vertebral artery origins are widely patent. Both vertebral arteries appear normal through the cervical region to the foramen magnum. Skeleton: Mild cervical spondylosis. Other neck: No lymphadenopathy. Thyroid nodule of the isthmus measuring up to 18 mm in diameter. Upper chest: Mild pleural and parenchymal scarring at the lung apices. Review of the MIP images confirms the above findings CTA HEAD  FINDINGS Anterior circulation: Both internal carotid arteries are patent through the skull base and siphon regions. The anterior and middle cerebral vessels appear normal. Posterior circulation: Both vertebral arteries are patent through the foramen magnum to the basilar. No basilar stenosis. Posterior circulation branch vessels are normal. There are patent bilateral posterior communicating arteries. Venous sinuses: Patent and normal. Anatomic variants: None significant. Review of the MIP images confirms the above findings IMPRESSION: Normal head CT. Negative CT angiography of the neck and head. No atherosclerotic disease at either carotid bifurcation. No intracranial large or medium vessel occlusion or correctable proximal stenosis. 18 mm nodule of the thyroid isthmus. Recommend thyroid US (ref: J Am Coll Radiol. 2015 Feb;12(2): 143-50). Question pericardial fluid in the superior recess. This is not definitely pathologic. The entire chest was not imaged. Electronically Signed   By: Paulina Fusi M.D.   On: 06/06/2021 19:43   CT Angio Neck W and/or Wo Contrast  Result Date: 06/06/2021 CLINICAL DATA:  Nonspecific dizziness.  TIA.  Speech disturbance. EXAM: CT ANGIOGRAPHY HEAD AND NECK TECHNIQUE: Multidetector CT imaging of the head and neck was performed using the standard protocol during bolus administration of intravenous contrast. Multiplanar CT image reconstructions and MIPs were obtained to evaluate the vascular anatomy. Carotid stenosis measurements (when applicable) are obtained utilizing NASCET criteria, using the distal internal carotid diameter as the denominator. CONTRAST:  75mL OMNIPAQUE IOHEXOL 350 MG/ML SOLN COMPARISON:  None. FINDINGS: CT HEAD FINDINGS Brain: Mild age related volume loss. No evidence of old or acute focal infarction, mass lesion, hemorrhage, hydrocephalus or extra-axial collection. Vascular: No abnormal vascular finding. Skull: Normal Sinuses: Clear Orbits: Normal Review of the MIP  images confirms the above findings CTA NECK FINDINGS Aortic arch: Mild aortic atherosclerosis. Probable pericardial fluid in the superior recess. Right carotid system: Common carotid artery widely patent to the bifurcation. No carotid bifurcation atherosclerotic disease. No stenosis. Cervical ICA widely patent. Left carotid system: Common carotid artery widely patent to the bifurcation. No bifurcation atherosclerotic disease. Cervical ICA widely patent. Vertebral arteries: Both vertebral artery origins are widely patent. Both vertebral arteries appear normal through the cervical region to the foramen magnum. Skeleton: Mild cervical spondylosis. Other neck: No lymphadenopathy. Thyroid nodule of the isthmus measuring up to 18 mm in diameter. Upper chest: Mild pleural and parenchymal scarring at the lung apices. Review of the MIP images confirms the above findings CTA HEAD FINDINGS Anterior circulation: Both internal carotid arteries are patent through the skull base and siphon regions. The anterior and middle cerebral vessels appear normal. Posterior circulation: Both vertebral arteries are patent through the foramen magnum to the basilar. No basilar stenosis. Posterior circulation branch vessels are normal. There are patent bilateral posterior communicating arteries. Venous sinuses: Patent and normal. Anatomic variants: None significant. Review of the MIP images confirms the above findings IMPRESSION: Normal head CT. Negative CT angiography of the neck and head. No atherosclerotic disease at either carotid bifurcation. No intracranial large or medium vessel occlusion or correctable proximal stenosis. 18 mm nodule of the thyroid isthmus. Recommend thyroid US (ref: J Am Coll Radiol. 2015 Feb;12(2):  143-50). Question pericardial fluid in the superior recess. This is not definitely pathologic. The entire chest was not imaged. Electronically Signed   By: Paulina Fusi M.D.   On: 06/06/2021 19:43    Procedures Procedures    Medications Ordered in ED Medications  LORazepam (ATIVAN) injection 1 mg (has no administration in time range)  iohexol (OMNIPAQUE) 350 MG/ML injection 75 mL (75 mLs Intravenous Contrast Given 06/06/21 1754)  LORazepam (ATIVAN) injection 1 mg (1 mg Intravenous Given 06/06/21 1831)  LORazepam (ATIVAN) injection 1 mg (1 mg Intravenous Given 06/06/21 1913)    ED Course  I have reviewed the triage vital signs and the nursing notes.  Pertinent labs & imaging results that were available during my care of the patient were reviewed by me and considered in my medical decision making (see chart for details).    MDM Rules/Calculators/A&P                           Danielle Rocha is a 70 y.o. female here presenting with tremors trouble speaking and dizziness.  Patient is outside the window for stroke.  I consulted teleneurology (Dr. Selina Cooley).  She recommends CTA and MRI brain.  9:32 PM Patient's symptoms have resolved.  MRI and CT did not show any stroke or LVO.  She has incidental thyroid nodule.  At this point patient can be discharged and can follow-up outpatient  Final Clinical Impression(s) / ED Diagnoses Final diagnoses:  None    Rx / DC Orders ED Discharge Orders     None        Charlynne Pander, MD 06/06/21 2133

## 2021-06-06 NOTE — Plan of Care (Signed)
Neurology Brief phone note:   HPI: Danielle Rocha is a 70 y.o. female   Past Medical History:  Diagnosis Date   Arthritis    DJD, low back, thumb   Atrial fibrillation (HCC)    Xarelto anticoagulation. Flecainide antiarrythmic    Chicken pox    Chronic pain syndrome    On disability. History of bilateral hip pain, low back pain. Gabapentin 400 BID, percocet 1 tablet daily per prior provider.    Colon polyp    awaiting records   Depression    zoloft 100mg , remeron 30mg  per psychiatry. ambien 10mg  per psychiatry to help wtih sleep element.    Dystonia    described as psychogenic dystonia. Pain and twisting from upper chest and up with triggers "wind, creamy food" on TID ativan per psychiatry previously.    Fibromyalgia    GERD (gastroesophageal reflux disease)    omeprazole OTC   Goiter    states multiple imaging tests, has had biopsies   Hyperlipidemia    lovastatin 20mg    Stroke (HCC)    TIA (left side of face and body decreased sensitivity than right face and side)   TIA (transient ischemic attack)    Vaginal atrophy    estrace vaginal cream   Past Surgical History:  Procedure Laterality Date   ATRIAL FIBRILLATION ABLATION     BIOPSY THYROID     fusion l4-l5  09/22/1998   LAMINECTOMY     L4-L5   OTHER SURGICAL HISTORY     tennis elbow surgery   piriformis release  09/22/1997   right hip   TONSILLECTOMY AND ADENOIDECTOMY  age 45   VAGINAL HYSTERECTOMY  09/23/1991    Current Facility-Administered Medications:    LORazepam (ATIVAN) injection 1 mg, 1 mg, Intravenous, Once PRN, 11/21/1998, MD  Current Outpatient Medications:    acetaminophen (TYLENOL) 500 MG tablet, Take 1,000 mg by mouth every 6 (six) hours as needed for mild pain, moderate pain, fever or headache., Disp: , Rfl:    atorvastatin (LIPITOR) 20 MG tablet, Take 1 tablet (20 mg total) by mouth daily., Disp: 30 tablet, Rfl: 5   cephALEXin (KEFLEX) 250 MG capsule, Take 250 mg by mouth daily. Takes  daily for frequent UTIs., Disp: , Rfl:    cholecalciferol (VITAMIN D) 1000 units tablet, Take 1,000 Units by mouth daily., Disp: , Rfl:    co-enzyme Q-10 30 MG capsule, Take 30 mg by mouth daily., Disp: , Rfl:    Cyanocobalamin (B-12 PO), Take by mouth., Disp: , Rfl:    FERROUS SULFATE PO, Take by mouth. (Patient not taking: Reported on 06/06/2021), Disp: , Rfl:    flecainide (TAMBOCOR) 100 MG tablet, Take 1 tablet (100 mg total) by mouth 2 (two) times daily., Disp: 180 tablet, Rfl: 3   gabapentin (NEURONTIN) 300 MG capsule, Take 300 mg by mouth at bedtime. , Disp: , Rfl:    LORazepam (ATIVAN) 1 MG tablet, Take 1 mg by mouth 3 (three) times daily., Disp: , Rfl:    Melatonin 3 MG TABS, Take 2 tablets by mouth at bedtime. , Disp: , Rfl:    Multiple Vitamin (MULTIVITAMIN WITH MINERALS) TABS tablet, Take 1 tablet by mouth daily., Disp: , Rfl:    ondansetron (ZOFRAN ODT) 4 MG disintegrating tablet, Take 1 tablet (4 mg total) by mouth every 8 (eight) hours as needed for nausea or vomiting., Disp: 20 tablet, Rfl: 0   pantoprazole (PROTONIX) 20 MG tablet, Take 1 tablet (20 mg total) by mouth  2 (two) times daily before a meal. Take 2 tablets daily., Disp: 180 tablet, Rfl: 3   rivaroxaban (XARELTO) 20 MG TABS tablet, Take 1 tablet (20 mg total) by mouth daily with supper., Disp: 90 tablet, Rfl: 3   sertraline (ZOLOFT) 50 MG tablet, Take 1 tablet (50 mg total) by mouth daily., Disp: 30 tablet, Rfl: 6   traZODone (DESYREL) 150 MG tablet, Take 150 mg by mouth at bedtime. , Disp: , Rfl:    Family History  Problem Relation Age of Onset   Alcohol abuse Mother        possible she had stomach cancer as well but unsure   Hypertension Father    Alcohol abuse Father    Pancreatic cancer Father    Cancer Brother        spindle cell RLE   Diabetes Brother    Cancer Brother        tonsil cancer   Skin cancer Brother    Colon cancer Neg Hx    Colon polyps Neg Hx    Stomach cancer Neg Hx     Social History:   reports that she has never smoked. She has never used smokeless tobacco. She reports that she does not drink alcohol and does not use drugs.    I have reviewed labs in epic and the results pertinent to this consultation are: CMP notable for mildly elevated glucose at 105, otherwise unremarkable, CBC within normal limits PT/PTT/INR within normal limits  ethanol level negative Respiratory panel negative  I have reviewed the images obtained:  MRI Brain personally reviewed, agree with radiology Negative examination. Mild age related volume loss. No old or acute focal insult.   CTA head and neck personally reviewed, agree with radiology  Normal head CT. Negative CT angiography of the neck and head. No atherosclerotic disease at either carotid bifurcation. No intracranial large or medium vessel occlusion or correctable proximal stenosis. 18 mm nodule of the thyroid isthmus. Recommend thyroid US (ref: J Am Coll Radiol. 2015 Feb;12(2): 143-50).   Question pericardial fluid in the superior recess. This is not definitely pathologic. The entire chest was not imaged.   Impression:  Discussed briefly with Dr. Silverio Lay, given reassuring imaging findings patient can safely be continued on her home Eliquis and atorvastatin and would be medically maximized for stroke prevention with these medications.  Discussed that she does have history of dystonia/involuntary movements and query whether these episodes are potentially related to that disorder which would be appropriate for outpatient follow-up.  If there is significant concern that these spells may represent focal seizures on reevaluation of the patient, EEG could be considered but otherwise do not see an indication for further inpatient neurological work-up   Recommendations: - Home atorvastatin 20 mg should be continued at this time, may need to increase if LDL is above goal of less than 70 - Goal A1c less than 7%, can be confirmed on an outpatient basis   - no neurologic indication for echocardiogram given patient is already on therapeutic anticoagulation - Thryoid nodule, 18 mm, follow-up per PCP or primary team (ultrasound recommended per radiology) - Workup and eval of possible pericardial fluid per primary team - Potential EEG at discretion of ED provider/primary team  The recommendations are based on information available in the chart and provided from the ED provider as noted above and do not replace a full in person consult.  Please do reach out to neurology for phone in person consult is felt to be  needed (for example if EEG is obtained and shows abnormality)  Brooke Dare MD-PhD [facility] [phone] Available 7 PM to 7 AM, outside of these hours please call Neurologist on call as listed on Amion.

## 2021-06-07 ENCOUNTER — Other Ambulatory Visit: Payer: Self-pay

## 2021-06-07 MED ORDER — ROSUVASTATIN CALCIUM 20 MG PO TABS: 20.0000 mg | ORAL_TABLET | Freq: Every day | ORAL | 3 refills | Status: DC

## 2021-06-11 ENCOUNTER — Encounter: Payer: Self-pay | Admitting: Family Medicine

## 2021-06-11 ENCOUNTER — Other Ambulatory Visit: Payer: Self-pay

## 2021-06-11 ENCOUNTER — Ambulatory Visit (INDEPENDENT_AMBULATORY_CARE_PROVIDER_SITE_OTHER): Payer: Medicare HMO | Admitting: Family Medicine

## 2021-06-11 VITALS — BP 111/62 | HR 78 | Temp 98.2°F | Ht 67.0 in | Wt 145.6 lb

## 2021-06-11 DIAGNOSIS — E785 Hyperlipidemia, unspecified: Secondary | ICD-10-CM

## 2021-06-11 DIAGNOSIS — G249 Dystonia, unspecified: Secondary | ICD-10-CM

## 2021-06-11 DIAGNOSIS — E041 Nontoxic single thyroid nodule: Secondary | ICD-10-CM

## 2021-06-11 DIAGNOSIS — R69 Illness, unspecified: Secondary | ICD-10-CM | POA: Diagnosis not present

## 2021-06-11 DIAGNOSIS — K219 Gastro-esophageal reflux disease without esophagitis: Secondary | ICD-10-CM | POA: Diagnosis not present

## 2021-06-11 DIAGNOSIS — R413 Other amnesia: Secondary | ICD-10-CM

## 2021-06-11 DIAGNOSIS — R42 Dizziness and giddiness: Secondary | ICD-10-CM | POA: Diagnosis not present

## 2021-06-11 DIAGNOSIS — F418 Other specified anxiety disorders: Secondary | ICD-10-CM

## 2021-06-11 DIAGNOSIS — I4891 Unspecified atrial fibrillation: Secondary | ICD-10-CM

## 2021-06-11 NOTE — Patient Instructions (Addendum)
Health Maintenance Due  Topic Date Due   COVID-19 Vaccine (3 - Pfizer risk series)  Recommend getting the Omicron variant specific booster at your local pharmacy. 01/04/2020   MAMMOGRAM   Please schedule your mammogram or have them send [place] a copy if you have had this done in the last year.  [facility] [place] [place] Schedule an appointment by calling [phone]. 03/24/2020   We will call you within two weeks about your referral to Cardiology. If you do not hear within 2 weeks, give [place] a call.   We will call you within two weeks about your referral to Thyroid Ultrasound. If you do not hear within 2 weeks, give [place] a call.   We will call you within two weeks about your referral to [facility] Neurology. If you do not hear within 2 weeks, give [place] a call.   Please remember to stay well hydrated!  Please use your GPS - if you continue to get lost, we may need to consider stopping you from driving.  Recommended follow up: Schedule a follow-up after your Cardiology and Neurology visits or sooner if needed.

## 2021-06-11 NOTE — Progress Notes (Signed)
Phone [phone] In person visit   Subjective:   Danielle Rocha is a 70 y.o. year old very pleasant female patient who presents for/with See problem oriented charting Chief Complaint  Patient presents with   Follow-up    Dizziness; 9/15. She says that the dizziness has been ongoing, but she repeats things, loses things, and can not remember a lot.  She also says that she stumbles a lot. This all started last week.    This visit occurred during the SARS-CoV-2 public health emergency.  Safety protocols were in place, including screening questions prior to the visit, additional usage of staff PPE, and extensive cleaning of exam room while observing appropriate contact time as indicated for disinfecting solutions.   Past Medical History-  Patient Active Problem List   Diagnosis Date Noted   Dystonia     Priority: High   Major depression in full remission (HCC) 09/02/2016    Priority: High   History of SI/intentional drug overdose 11/22/2015    Priority: High   Atrial fibrillation (HCC)     Priority: High   Chronic pain syndrome     Priority: High   Aortic atherosclerosis (HCC) 02/07/2020    Priority: Medium   Delayed gastric emptying 02/07/2020    Priority: Medium   Restless legs 07/27/2018    Priority: Medium   Recurrent UTI 10/07/2016    Priority: Medium   Hyperlipidemia     Priority: Medium   GERD (gastroesophageal reflux disease) 01/30/2016    Priority: Low   Involuntary movements 02/14/2020   Depression with anxiety 02/14/2020    Medications- reviewed and updated Current Outpatient Medications  Medication Sig Dispense Refill   acetaminophen (TYLENOL) 500 MG tablet Take 1,000 mg by mouth every 6 (six) hours as needed for mild pain, moderate pain, fever or headache.     cephALEXin (KEFLEX) 250 MG capsule Take 250 mg by mouth daily. Takes daily for frequent UTIs.     cholecalciferol (VITAMIN D) 1000 units tablet Take 1,000 Units by mouth daily.     co-enzyme Q-10  30 MG capsule Take 30 mg by mouth daily.     Cyanocobalamin (B-12 PO) Take by mouth.     flecainide (TAMBOCOR) 100 MG tablet Take 1 tablet (100 mg total) by mouth 2 (two) times daily. 180 tablet 3   gabapentin (NEURONTIN) 300 MG capsule Take 300 mg by mouth at bedtime.      LORazepam (ATIVAN) 1 MG tablet Take 1 mg by mouth 3 (three) times daily.     Melatonin 3 MG TABS Take 2 tablets by mouth at bedtime.      Multiple Vitamin (MULTIVITAMIN WITH MINERALS) TABS tablet Take 1 tablet by mouth daily.     ondansetron (ZOFRAN ODT) 4 MG disintegrating tablet Take 1 tablet (4 mg total) by mouth every 8 (eight) hours as needed for nausea or vomiting. 20 tablet 0   pantoprazole (PROTONIX) 20 MG tablet Take 1 tablet (20 mg total) by mouth 2 (two) times daily before a meal. Take 2 tablets daily. 180 tablet 3   rivaroxaban (XARELTO) 20 MG TABS tablet Take 1 tablet (20 mg total) by mouth daily with supper. 90 tablet 3   rosuvastatin (CRESTOR) 20 MG tablet Take 1 tablet (20 mg total) by mouth daily. 90 tablet 3   sertraline (ZOLOFT) 50 MG tablet Take 1 tablet (50 mg total) by mouth daily. 30 tablet 6   traZODone (DESYREL) 150 MG tablet Take 150 mg by mouth at bedtime.  FERROUS SULFATE PO Take by mouth. (Patient not taking: No sig reported)     No current facility-administered medications for this visit.     Objective:  BP 111/62   Pulse 78   Temp 98.2 F (36.8 C) (Temporal)   Ht 5\' 7"  (1.702 m)   Wt 145 lb 9.6 oz (66 kg)   LMP  (LMP Unknown)   SpO2 97%   BMI 22.80 kg/m  Gen: NAD, resting comfortably CV: RRR no murmurs rubs or gallops Lungs: CTAB no crackles, wheeze, rhonchi Abdomen: soft/nontender/nondistended/normal bowel sounds. No rebound or guarding.  Ext: no edema Skin: warm, dry Neuro: CN II-XII intact, sensation and reflexes normal throughout, 5/5 muscle strength in bilateral upper and lower extremities. Normal finger to nose. Normal rapid alternating movements. No pronator drift.  Normal romberg. Normal gait.  -ongoing up and down head movement once again through visit     Assessment and Plan  # ED F/U for Dizziness S:Patient presented to the ED on 06/06/21 and stated she had been having trouble speaking and dizziness over the past 2-3 days prior to the visit and was forgetful at times. She reported trouble walking and even having a fall at [employer] that day- has history of TIA so was concerned.  I had seen patient that same day in office and she had not reported similar symptoms- though fall had not occurred yet.  Dr. 06/08/21 consulted with Dr. Silverio Lay for teleneurology - she recommended a CTA and MRI of the brain.  A CT Angio Head/Neck W or WO Contrast was ordered and showed a normal head CT, negative CT angiography of the neck and head, no atherosclerotic disease at either carotid bifurcation, no intracranial large or medium vessel occlusion or correctable proximal stenosis, an 18 mm nodule of the thyroid isthmus, recommendation for thyroid Selina Cooley, and question pericardial fluid in the superior recess.  At 9:32 PM patients symptoms resolved. MRI and CT did not show any stroke or LVO. She was later discharged and instructed to have an outpatient follow-up.  Today son reports she had actually gotten lost prior to our visit. Son reports had some "word salad". Also veered off in driveway into bushes. Son reports afterwards has noted some lingering memory changes and not cognitively as sharp- some fragmented sentences and starting a story from middle with no background. Had a family friend say she was speaking irregularly. Dizziness has been more profound- ongoing shakiness/noted dystonia.   Son has noted some memory changes in last 6 months and worse in last few weeks. She wonders if wellbutrin could contribute- feels like symptoms worsened when this was increased.  She reports not taking lorazepam at this time.   Takes b12 already (on long term PPI)- discussed possible correlation with  memory changes.   States feels imbalanced but no room spinning. Feels lightheaded as well- sytmpsom are rather constant. Worse with activity  A/P: 71 year old female with long-term history of involuntary movements/dystonia possibly psychogenic but has seen neurology Dr. 79 in the past with new symptoms of memory loss over 6 months as well as dizziness/balance issues though denies room spinning for similar time frame with new symptom in last 10 days of some speech changes.   For dizziness- she has already stopped propranolol and that helped osme. Encouraged hre to stay well hydrated as some of her issues sound positional and potentially orthostatic in origin.  I would also like for her to see cardiology as it has been a while for their expert opinion  on her atrial fibrillation and to see if they think an underlying arrhythmia could be causing her symptoms  For memory loss/speech changes-we opted to refer to neurology-she would like an opinion outside of Dr. Jeremy Johann to [facility].  She has already had a TSH recently which was normal.  B12 she is on a supplement so I do not think we have to recheck that at this time.  CBC and CMP were largely normal.  She is only active with her husband so declines need for syphilis and HIV testing -poor control of depression could be contributing to memory changes -has gotten lost twice- agrees to use GPS and if still getting lost we need to look at stopping driving -reassuring neuro exam today and I do not note speech changes  Incidental thyroid nodule was noted-we will order follow-up thyroid ultrasound.  As noted TSH was normal recently. Apparently had benign biopsies years ago in [place]  #Dystonia- seen Dr. Terrace Arabia  last year -sinemet without relief - had tried propranolol but even with reduced dose had to stop- low BP, worsened dizziness -Per Dr. Donell Beers- Also started on ativan half mg in Am and 1 mg at night for dystonia- has not helped much- not taking  right now -referring to new neurologist primarily for memory loss but if they have any thoughts on dystonia she would be open- working with a therapist has been helpful  #Atrial fibrillation S:Compliant with Xarelto for anticoagulation. Flecainide as an antiarrhythmic. Does not follow with cardiology regularly A/P: appropriately anticoagulated. Rate is controlled- with ongoing dizziness and fact she has not seen cardiology in a while- will refer back to cardiology for their expert opinion.    #Major depressive disorder S: currently on sertraline 50mg  and wellbutrin 150 XL 3 in the AM as well. Also on gabepentin 300mg  at night, trazodone 150 mg before bed working with psychiatry Dr. .   Per Dr. - Also started on ativan half mg in Am and 1 mg at night for dystonia- not taking right now A/P: depression with poor control- she is conscerned symptoms could be related to wellbutrin- encouraged follow up with psychiatry   #hyperlipidemia/aortic atherosclerosis- incidental finding on 11/04/19 CT S:compliant on atorvastatin 20 mg (has not started rosuvastatin 20 mg daily yet). LDL goal at least under  70- we changed rx last visit as a result Lab Results  Component Value Date   CHOL 197 06/06/2021   HDL 77.20 06/06/2021   LDLCALC 96 06/06/2021   LDLDIRECT 104.0 11/01/2019   TRIG 118.0 06/06/2021   CHOLHDL 3 06/06/2021  A/P: Encourage patient to transition to rosuvastatin-rosuvastatin crossing blood-brain barrier less than atorvastatin hopeful this will be helpful in regards to concerns about memory.  We also need to push LDL under 70 and as above LDL 96 with poor control-hopefully this transition will help patient  #GERD- sees GI now S:compliant with BID PPI- gastritis history 2022. prior just on omeprazole otc. She is on b12 and folate in regards to PPI risk of lowering these.  Lab Results  Component Value Date   VITAMINB12 905 10/28/2018  A/P: Long-term PPI-I do not think this is  the cause of her memory loss and B12 has been adequate and on supplement-continue monitor.  Recommended follow up: schedule follow up after cardiology and neurology visits so we have that info to move forward or sooner if new or worsening symptoms Future Appointments  Date Time Provider Department Center  09/09/2021  2:30 PM [facility]  06/09/2022  9:40 AM Durene Cal, Aldine Contes, MD [facility]    Lab/Order associations:   ICD-10-CM   1. Dizziness  R42     2. Memory loss  R41.3 Ambulatory referral to Neurology    CANCELED: Ambulatory referral to Neurology    3. Dystonia  G24.9     4. Hyperlipidemia, unspecified hyperlipidemia type  E78.5     5. Atrial fibrillation, unspecified type [facility])  I48.91 Ambulatory referral to Cardiology    6. Depression with anxiety  F41.8     7. Thyroid nodule  E04.1 US THYROID    8. Gastroesophageal reflux disease without esophagitis  K21.9      I,Harris Phan,acting as a scribe for Tana Conch, MD.,have documented all relevant documentation on the behalf of Tana Conch, MD,as directed by  Tana Conch, MD while in the presence of Tana Conch, MD.  I, Tana Conch, MD, have reviewed all documentation for this visit. The documentation on 06/11/21 for the exam, diagnosis, procedures, and orders are all accurate and complete.   Return precautions advised.  Tana Conch, MD

## 2021-06-12 ENCOUNTER — Other Ambulatory Visit: Payer: Self-pay | Admitting: Family Medicine

## 2021-06-12 ENCOUNTER — Telehealth: Payer: Self-pay | Admitting: Internal Medicine

## 2021-06-12 ENCOUNTER — Ambulatory Visit
Admission: RE | Admit: 2021-06-12 | Discharge: 2021-06-12 | Disposition: A | Discharge status: Home / Self Care | Payer: Medicare HMO | Source: Ambulatory Visit | Attending: Family Medicine | Admitting: Family Medicine

## 2021-06-12 DIAGNOSIS — E041 Nontoxic single thyroid nodule: Secondary | ICD-10-CM

## 2021-06-12 DIAGNOSIS — E042 Nontoxic multinodular goiter: Secondary | ICD-10-CM | POA: Diagnosis not present

## 2021-06-12 NOTE — Telephone Encounter (Signed)
STAT if patient feels like he/she is going to faint   Are you dizzy now? no  Do you feel faint or have you passed out?  Pt fell because of this  Do you have any other symptoms? Lightheaded and problems with her memories  Have you checked your HR and BP (record if available)? Good- pt's doctor wanted her to be seen- I made her appointment with Dr Tenny Craw tomorrow(06-13-21)

## 2021-06-12 NOTE — Telephone Encounter (Signed)
Spoke with pt for quite sometime and for a couple of months pt has noted unstable gait, short term memory loss ,and can fall easily( pt fell the other day at [facility] while standing in line) the patient also notes on occasion after walking a 1/4 of a mile notes SOB and fatigue.Per pt B/P  has been running in the evening 80-90/40-50  the other day at MD appt B/P was 120/50 HR during the day is 70-72 and in the evening 38-40 Per pt neuro had started pt on Propranolol has been on this for over a year and pt has just recently stopped this a month ago with no changes in the symptoms Per pt has appt on Friday with Neuro as well and appt tomorrow (06/13/21) with Dr Tenny Craw Per pt recently seen PCP and was told to f/u with cardiologists  Will forward to Dr Tenny Craw for review .Zack Seal

## 2021-06-13 ENCOUNTER — Other Ambulatory Visit: Payer: Self-pay

## 2021-06-13 ENCOUNTER — Ambulatory Visit: Payer: Medicare HMO | Admitting: Internal Medicine

## 2021-06-13 ENCOUNTER — Encounter: Payer: Self-pay | Admitting: Internal Medicine

## 2021-06-13 ENCOUNTER — Telehealth: Payer: Self-pay | Admitting: Internal Medicine

## 2021-06-13 VITALS — BP 93/60 | HR 76 | Ht 67.5 in | Wt 148.2 lb

## 2021-06-13 DIAGNOSIS — R6889 Other general symptoms and signs: Secondary | ICD-10-CM

## 2021-06-13 DIAGNOSIS — R69 Illness, unspecified: Secondary | ICD-10-CM | POA: Diagnosis not present

## 2021-06-13 DIAGNOSIS — F331 Major depressive disorder, recurrent, moderate: Secondary | ICD-10-CM | POA: Diagnosis not present

## 2021-06-13 NOTE — Progress Notes (Signed)
Cannot note vital in chart at this time is chart is being used another location.  VITAL SIGNS: 06/13/21 BP: 108/6 WEIGHT: 148lb  HEIGHT: 5.75 SPO2: 97% HR: 76

## 2021-06-13 NOTE — Progress Notes (Signed)
Cardiology Office Note   Date:  06/14/2021   ID:  Danielle Rocha, DOB [birth year], MRN [id number]  PCP:  Shelva Majestic, MD  Cardiologist:   Dietrich Pates, MD   Pt presents for f/u of PAF and also evaluation of "dizziness"   History of Present Illness: Danielle Rocha is a 70 y.o. female with a history of PAF   She is s/p abltation in 2010s in [place]    I saw her as a televisit in 2021  Remains on flecanide and Xarelto  Over the past few wks the pt has had problems with unsteadniess   On 9/15 she was at [facility]   unsteady    [place] Seat  Did not pass out  Has problems with unsteadiness with walking , moving around  [place] to ED  THe patient also notes that she is more forgetful  The pt says last week had 3 appts   Got lost 3 times     Pt also has problems swallowing recenly  Food gets stuck     She denies palpitations   Breathig is OK   No CP       Current Meds  Medication Sig   acetaminophen (TYLENOL) 500 MG tablet Take 1,000 mg by mouth every 6 (six) hours as needed for mild pain, moderate pain, fever or headache.   cephALEXin (KEFLEX) 250 MG capsule Take 250 mg by mouth daily. Takes daily for frequent UTIs.   cholecalciferol (VITAMIN D) 1000 units tablet Take 1,000 Units by mouth daily.   co-enzyme Q-10 30 MG capsule Take 30 mg by mouth daily.   Cyanocobalamin (B-12 PO) Take by mouth.   FERROUS SULFATE PO Take by mouth.   flecainide (TAMBOCOR) 100 MG tablet Take 1 tablet (100 mg total) by mouth 2 (two) times daily.   gabapentin (NEURONTIN) 300 MG capsule Take 300 mg by mouth at bedtime.    LORazepam (ATIVAN) 1 MG tablet Take 1 mg by mouth 3 (three) times daily.   Melatonin 3 MG TABS Take 2 tablets by mouth at bedtime.    Multiple Vitamin (MULTIVITAMIN WITH MINERALS) TABS tablet Take 1 tablet by mouth daily.   ondansetron (ZOFRAN ODT) 4 MG disintegrating tablet Take 1 tablet (4 mg total) by mouth every 8 (eight) hours as needed for nausea or vomiting.   pantoprazole (PROTONIX) 20  MG tablet Take 1 tablet (20 mg total) by mouth 2 (two) times daily before a meal. Take 2 tablets daily.   rivaroxaban (XARELTO) 20 MG TABS tablet Take 1 tablet (20 mg total) by mouth daily with supper.   rosuvastatin (CRESTOR) 20 MG tablet Take 1 tablet (20 mg total) by mouth daily.   sertraline (ZOLOFT) 50 MG tablet Take 1 tablet (50 mg total) by mouth daily.   traZODone (DESYREL) 150 MG tablet Take 150 mg by mouth at bedtime.      Allergies:   Reglan [metoclopramide], Ciprofloxacin, Clindamycin/lincomycin, Vibramycin [doxycycline], Macrobid [nitrofurantoin monohyd macro], and Sulfa antibiotics   Past Medical History:  Diagnosis Date   Arthritis    DJD, low back, thumb   Atrial fibrillation (HCC)    Xarelto anticoagulation. Flecainide antiarrythmic    Chicken pox    Chronic pain syndrome    On disability. History of bilateral hip pain, low back pain. Gabapentin 400 BID, percocet 1 tablet daily per prior provider.    Colon polyp    awaiting records   Depression    zoloft 100mg , remeron 30mg  per psychiatry. ambien 10mg  per psychiatry  to help wtih sleep element.    Dystonia    described as psychogenic dystonia. Pain and twisting from upper chest and up with triggers "wind, creamy food" on TID ativan per psychiatry previously.    Fibromyalgia    GERD (gastroesophageal reflux disease)    omeprazole OTC   Goiter    states multiple imaging tests, has had biopsies   Hyperlipidemia    lovastatin 20mg    Stroke (HCC)    TIA (left side of face and body decreased sensitivity than right face and side)   TIA (transient ischemic attack)    Vaginal atrophy    estrace vaginal cream    Past Surgical History:  Procedure Laterality Date   ATRIAL FIBRILLATION ABLATION     BIOPSY THYROID     fusion l4-l5  09/22/1998   LAMINECTOMY     L4-L5   OTHER SURGICAL HISTORY     tennis elbow surgery   piriformis release  09/22/1997   right hip   TONSILLECTOMY AND ADENOIDECTOMY  age 79   VAGINAL  HYSTERECTOMY  09/23/1991     Social History:  The patient  reports that she has never smoked. She has never used smokeless tobacco. She reports that she does not drink alcohol and does not use drugs.   Family History:  The patient's family history includes Alcohol abuse in her father and mother; Cancer in her brother and brother; Diabetes in her brother; Hypertension in her father; Pancreatic cancer in her father; Skin cancer in her brother.    ROS:  Please see the history of present illness. All other systems are reviewed and  Negative to the above problem except as noted.    PHYSICAL EXAM: VS:  BP 93/60 (BP Location: Right Arm, Cuff Size: Normal)   Pulse 76 Comment: still unsteady but better than at 2 minutes  Ht 5' 7.5" (1.715 m)   Wt 148 lb 3.2 oz (67.2 kg)   LMP  (LMP Unknown)   SpO2 97%   BMI 22.87 kg/m    Orthostatics:     Laying:  108/62  P 66  Sitting   103/63  P 70    Standing 0 min  92/60  P 75   Standing 2 mn 97/56  P 76  Unsteady   Standing 5 min    93/60   P 76  Less dizzy GEN: Well nourished, well developed, in no acute distress  HEENT: normal  Neck: no JVD, carotid bruits, Cardiac: RRR; no murmurs  No LE edema  Respiratory:  clear to auscultation bilaterally, normal work of breathing GI: soft, nontender, nondistended, + BS  No hepatomegaly  MS: no deformity Moving all extremities   Skin: warm and dry, no rash Neuro:  CN II t oXII intact    Finger to nose  Slow, inaccurate Unable to stand with palms up  Sways     Psych: euthymic mood, full affect   EKG:  EKG is not ordered today.   Lipid Panel    Component Value Date/Time   CHOL 197 06/06/2021 1007   TRIG 118.0 06/06/2021 1007   HDL 77.20 06/06/2021 1007   CHOLHDL 3 06/06/2021 1007   VLDL 23.6 06/06/2021 1007   LDLCALC 96 06/06/2021 1007   LDLDIRECT 104.0 11/01/2019 0916      Wt Readings from Last 3 Encounters:  06/13/21 148 lb 3.2 oz (67.2 kg)  06/11/21 145 lb 9.6 oz (66 kg)  06/06/21 147 lb  (66.7 kg)  ASSESSMENT AND PLAN:  1  Unsteadiness/ fall.   Recent fall was not syncope    Appears to represent instability    (? Cerebellar)   BP is marginal at times but she does not meet criteria for orthostasis   But, low bp is not helping   Pt has appt with neruo tomorrow   Will forward. Could consider midodrine low dose     2 Memory  Pt with forgetfultness  Again, new   ? How relates to problem 1  Low BP proalm   3  PAF  No symptoms of recurrence   Keep on current meds including flecanide and Xarelto  Current medicines are reviewed at length with the patient today.  The patient does not have concerns regarding medicines.  Signed, Dietrich Pates, MD  06/14/2021 12:01 AM    [facility] [address] [place], [place], [place]  [address] Phone: [phone]; Fax: [phone]

## 2021-06-13 NOTE — Patient Instructions (Signed)
Medication Instructions:  Your physician recommends that you continue on your current medications as directed. Please refer to the Current Medication list given to you today.  *If you need a refill on your cardiac medications before your next appointment, please call your pharmacy*   Lab Work: none If you have labs (blood work) drawn today and your tests are completely normal, you will receive your results only by: MyChart Message (if you have MyChart) OR A paper copy in the mail If you have any lab test that is abnormal or we need to change your treatment, we will call you to review the results.   Testing/Procedures: none   Follow-Up: At [facility], you and your health needs are our priority.  As part of our continuing mission to provide you with exceptional heart care, we have created designated Provider Care Teams.  These Care Teams include your primary Cardiologist (physician) and Advanced Practice Providers (APPs -  Physician Assistants and Nurse Practitioners) who all work together to provide you with the care you need, when you need it.  We recommend signing up for the patient portal called "MyChart".  Sign up information is provided on this After Visit Summary.  MyChart is used to connect with patients for Virtual Visits (Telemedicine).  Patients are able to view lab/test results, encounter notes, upcoming appointments, etc.  Non-urgent messages can be sent to your provider as well.   To learn more about what you can do with MyChart, go to [email or web address].    Your next appointment:   To be determined  The format for your next appointment:   In Person  Provider:   You may see Dr Tenny Craw or one of the following Advanced Practice Providers on your designated Care Team:   Tereso Newcomer, PA-C [place] Aus, [place]   Other Instructions

## 2021-06-13 NOTE — Patient Instructions (Addendum)
It was a pleasure to see you today at our office.   Recommendations:  Neurocognitive evaluation at our office Check labs today Recommend PT for balance and strength  Increase activity in things re interesting to you  Follow up once the results of the above are available   RECOMMENDATIONS FOR ALL PATIENTS WITH MEMORY PROBLEMS: 1. Continue to exercise (Recommend 30 minutes of walking everyday, or 3 hours every week) 2. Increase social interactions - continue going to Danielle Rocha and enjoy social gatherings with friends and family 3. Eat healthy, avoid fried foods and eat more fruits and vegetables 4. Maintain adequate blood pressure, blood sugar, and blood cholesterol level. Reducing the risk of stroke and cardiovascular disease also helps promoting better memory. 5. Avoid stressful situations. Live a simple life and avoid aggravations. Organize your time and prepare for the next day in anticipation. 6. Sleep well, avoid any interruptions of sleep and avoid any distractions in the bedroom that may interfere with adequate sleep quality 7. Avoid sugar, avoid sweets as there is a strong link between excessive sugar intake, diabetes, and cognitive impairment We discussed the Mediterranean diet, which has been shown to help patients reduce the risk of progressive memory disorders and reduces cardiovascular risk. This includes eating fish, eat fruits and green leafy vegetables, nuts like almonds and hazelnuts, walnuts, and also use olive oil. Avoid fast foods and fried foods as much as possible. Avoid sweets and sugar as sugar use has been linked to worsening of memory function.  There is always a concern of gradual progression of memory problems. If this is the case, then we may need to adjust level of care according to patient needs. Support, both to the patient and caregiver, should then be put into place.      You have been referred for a neuropsychological evaluation (i.e., evaluation of memory and  thinking abilities). Please bring someone with you to this appointment if possible, as it is helpful for the doctor to hear from both you and another adult who knows you well. Please bring eyeglasses and hearing aids if you wear them.    The evaluation will take approximately 3 hours and has two parts:   The first part is a clinical interview with the neuropsychologist (Dr. Milbert Coulter or Dr. Roseanne Reno). During the interview, the neuropsychologist will speak with you and the individual you brought to the appointment.    The second part of the evaluation is testing with the doctor's technician Annabelle Harman or Selena Batten). During the testing, the technician will ask you to remember different types of material, solve problems, and answer some questionnaires. Your family member will not be present for this portion of the evaluation.   Please note: We must reserve several hours of the neuropsychologist's time and the psychometrician's time for your evaluation appointment. As such, there is a No-Show fee of $100. If you are unable to attend any of your appointments, please contact our office as soon as possible to reschedule.    FALL PRECAUTIONS: Be cautious when walking. Scan the area for obstacles that may increase the risk of trips and falls. When getting up in the mornings, sit up at the edge of the bed for a few minutes before getting out of bed. Consider elevating the bed at the head end to avoid drop of blood pressure when getting up. Walk always in a well-lit room (use night lights in the walls). Avoid area rugs or power cords from appliances in the middle of the walkways. Use  a walker or a cane if necessary and consider physical therapy for balance exercise. Get your eyesight checked regularly.  FINANCIAL OVERSIGHT: Supervision, especially oversight when making financial decisions or transactions is also recommended.  HOME SAFETY: Consider the safety of the kitchen when operating appliances like stoves, microwave oven,  and blender. Consider having supervision and share cooking responsibilities until no longer able to participate in those. Accidents with firearms and other hazards in the house should be identified and addressed as well.   ABILITY TO BE LEFT ALONE: If patient is unable to contact 911 operator, consider using LifeLine, or when the need is there, arrange for someone to stay with patients. Smoking is a fire hazard, consider supervision or cessation. Risk of wandering should be assessed by caregiver and if detected at any point, supervision and safe proof recommendations should be instituted.  MEDICATION SUPERVISION: Inability to self-administer medication needs to be constantly addressed. Implement a mechanism to ensure safe administration of the medications.   DRIVING: Regarding driving, in patients with progressive memory problems, driving will be impaired. We advise to have someone else do the driving if trouble finding directions or if minor accidents are reported. Independent driving assessment is available to determine safety of driving.   If you are interested in the driving assessment, you can contact the following:  [employer] in [place]  [facility] [phone] or [phone]    [place] refers to food and lifestyle choices that are based on the traditions of countries located on the [employer]. This way of eating has been shown to help prevent certain conditions and improve outcomes for people who have chronic diseases, like kidney disease and heart disease. What are tips for following this plan? Lifestyle  Cook and eat meals together with your family, when possible. Drink enough fluid to keep your urine clear or pale yellow. Be physically active every day. This includes: Aerobic exercise like running or swimming. Leisure  activities like gardening, walking, or housework. Get 7-8 hours of sleep each night. If recommended by your health care provider, drink red wine in moderation. This means 1 glass a day for nonpregnant women and 2 glasses a day for men. A glass of wine equals 5 oz (150 mL). Reading food labels  Check the serving size of packaged foods. For foods such as rice and pasta, the serving size refers to the amount of cooked product, not dry. Check the total fat in packaged foods. Avoid foods that have saturated fat or trans fats. Check the ingredients list for added sugars, such as corn syrup. Shopping  At the grocery store, buy most of your food from the areas near the walls of the store. This includes: Fresh fruits and vegetables (produce). Grains, beans, nuts, and seeds. Some of these may be available in unpackaged forms or large amounts (in bulk). Fresh seafood. Poultry and eggs. Low-fat dairy products. Buy whole ingredients instead of prepackaged foods. Buy fresh fruits and vegetables in-season from local farmers markets. Buy frozen fruits and vegetables in resealable bags. If you do not have access to quality fresh seafood, buy precooked frozen shrimp or canned fish, such as tuna, salmon, or sardines. Buy small amounts of raw or cooked vegetables, salads, or olives from the deli or salad bar at your store. Stock your pantry so you always have certain foods on hand, such as olive oil, canned tuna, canned tomatoes, rice, pasta,  and beans. Cooking  Cook foods with extra-virgin olive oil instead of using butter or other vegetable oils. Have meat as a side dish, and have vegetables or grains as your main dish. This means having meat in small portions or adding small amounts of meat to foods like pasta or stew. Use beans or vegetables instead of meat in common dishes like chili or lasagna. Experiment with different cooking methods. Try roasting or broiling vegetables instead of steaming or sauteing  them. Add frozen vegetables to soups, stews, pasta, or rice. Add nuts or seeds for added healthy fat at each meal. You can add these to yogurt, salads, or vegetable dishes. Marinate fish or vegetables using olive oil, lemon juice, garlic, and fresh herbs. Meal planning  Plan to eat 1 vegetarian meal one day each week. Try to work up to 2 vegetarian meals, if possible. Eat seafood 2 or more times a week. Have healthy snacks readily available, such as: Vegetable sticks with hummus. Greek yogurt. Fruit and nut trail mix. Eat balanced meals throughout the week. This includes: Fruit: 2-3 servings a day Vegetables: 4-5 servings a day Low-fat dairy: 2 servings a day Fish, poultry, or lean meat: 1 serving a day Beans and legumes: 2 or more servings a week Nuts and seeds: 1-2 servings a day Whole grains: 6-8 servings a day Extra-virgin olive oil: 3-4 servings a day Limit red meat and sweets to only a few servings a month What are my food choices? Mediterranean diet Recommended Grains: Whole-grain pasta. Brown rice. Bulgar wheat. Polenta. Couscous. Whole-wheat bread. [place]. Vegetables: Artichokes. Beets. Broccoli. Cabbage. Carrots. Eggplant. Green beans. Chard. Kale. Spinach. Onions. Leeks. Peas. Squash. Tomatoes. Peppers. Radishes. Fruits: Apples. Apricots. Avocado. Berries. Bananas. Cherries. Dates. Figs. Grapes. Lemons. Melon. Oranges. Peaches. Plums. Pomegranate. Meats and other protein foods: Beans. Almonds. Sunflower seeds. Pine nuts. Peanuts. [place]. Salmon. Scallops. Shrimp. [place]. Tilapia. Clams. Oysters. Eggs. Dairy: Low-fat milk. Cheese. Greek yogurt. Beverages: Water. Red wine. Herbal tea. Fats and oils: Extra virgin olive oil. Avocado oil. Grape seed oil. Sweets and desserts: [place] yogurt with honey. Baked apples. Poached pears. Trail mix. Seasoning and other foods: Basil. Cilantro. Coriander. Cumin. Mint. Parsley. Sage. Rosemary. Tarragon. Garlic. Oregano. Thyme. Pepper.  Balsalmic vinegar. Tahini. Hummus. Tomato sauce. Olives. Mushrooms. Limit these Grains: Prepackaged pasta or rice dishes. Prepackaged cereal with added sugar. Vegetables: Deep fried potatoes (french fries). Fruits: Fruit canned in syrup. Meats and other protein foods: Beef. Pork. Lamb. Poultry with skin. Hot dogs. Berniece Salines. Dairy: Ice cream. Sour cream. Whole milk. Beverages: Juice. Sugar-sweetened soft drinks. Beer. Liquor and spirits. Fats and oils: Butter. Canola oil. Vegetable oil. Beef fat (tallow). Lard. Sweets and desserts: Cookies. Cakes. Pies. Candy. Seasoning and other foods: Mayonnaise. Premade sauces and marinades. The items listed may not be a complete list. Talk with your dietitian about what dietary choices are right for you. Summary The Mediterranean diet includes both food and lifestyle choices. Eat a variety of fresh fruits and vegetables, beans, nuts, seeds, and whole grains. Limit the amount of red meat and sweets that you eat. Talk with your health care provider about whether it is safe for you to drink red wine in moderation. This means 1 glass a day for nonpregnant women and 2 glasses a day for men. A glass of wine equals 5 oz (150 mL). This information is not intended to replace advice given to you by your health care provider. Make sure you discuss any questions you have with your health care provider.  Document Released: 05/01/2016 Document Revised: 06/03/2016 Document Reviewed: 05/01/2016 Elsevier Interactive Patient Education  2017 [employer].  Your provider has requested that you have labwork completed today. Please go to [facility] ([id number]) on the second floor of this building before leaving the office today. You do not need to check in. If you are not called within 15 minutes please check with the front desk.

## 2021-06-13 NOTE — Progress Notes (Addendum)
Assessment/Plan:   Danielle Rocha is a 70 y.o. year old female with risk factors including atrial fibrillation on anticoagulation, fibromyalgia, history of TIA psychogenic dystonia,  chronic pain syndrome, hyperlipidemia, depression, restless leg syndrome, anxiety, thyroid nodule (scheduled for bx 10/12) seen today for evaluation of memory loss.MRI brain wo contrast 06/06/21  Negative examination without acute abnormalities in the brainstem or cerebellum. No infarction, . Mild age related volume loss. No old or acute focal insult.Marland Kitchen MoCA today is 22/30 with deficiencies in visuospatial/executive, memory, language, abstraction, delayed recall  2/5, orientation  6/6    Recommendations:   Memory Loss   Neurocognitive testing to further evaluate cognitive concerns and determine underlying cause of memory changes, including potential contribution from sleep, anxiety, or depression  Check B12  (RLS) Discussed safety both in and out of the home.  Discussed the importance of regular daily schedule with inclusion of crossword puzzles to maintain brain function.  Continue to monitor mood with PCP.  Stay active at least 30 minutes at least 3 times a week. Recommend PT for balance and strength. Naps should be scheduled and should be no longer than 60 minutes and should not occur after 2 PM.  Mediterranean diet is recommended  Folllow up once results above are available   Subjective:    The patient is seen in neurologic consultation at the request of Shelva Majestic, MD for the evaluation of memory.  The patient is accompanied by son who supplements the history.She is a 70 y.o. year old female who has had memory issues for about  5 years, especially thinking of recent information. She finds herself repeating the same stories and asking the same questions. Word finding games are more difficult. Her long term memory is good. Her son reports that recently, she has become more disoriented. For example, in  [place], she got lost 3 times. She has noticed depressed mood. She carries a diagnosis of depression and has lost interest in activities such as word finding, exercising and socializing. Her son is very active in her care, but he does  admit that her husband "does everything for her, so she hardly does anything but does care for her Down syndrome son".  She reports a "lot of stress in my life". She is on 3 medications to help her sleep and for mood, including Zoloft, Gabapentin and Wellbutrin.    She sleeps from 11-6 am and reports "a hard time staying asleep. She admits to vivid dreams., denies sleepwalking. At night, she reports a person without a face, a female who passes her at the hallway. This event has been present for about 1 year. She also has some auditory hallucinations, hearing someone in the kitchen ( although son says he hears them too). She feels "paranoid, worry about everything". Her son says that "she overreacts, stressed all the time, doubts herself from cooking to everything else". She is under the care of a therapist to help her cope, and she likes him. She denies leaving objects in unusual places. She is independent of dressing and bathing. She denies missing any meds doses. Her husband has always been in charge of the finances. Her appetite is good, denies trouble swallowing.  She cooks and denies leaving the stove on or the faucet on. Ambulates without a  walker or cane, but has been experiencing falls,  "unstable with the feet ", no loss of consciousness.  She is prone to fall if she is on the grass, but not when she  is on a parking lot. Had a appointment with Cardiology yesterday, not felt to be of cardiac etiology. She was not orthostatic.  Denies head injuries. She continues to drive but has gotten lost 3 times over the last week for 3 different appts.  Denies headaches, double vision,  she has intermittent dizziness,  denies focal numbness or tingling, unilateral weakness.  She has  resting tremors when stressed. Denies urine incontinence or retention, constipation or diarrhea. Denies anosmia. Denies history of OSA, ETOH or Tobacco. Family History remarkable for mother and father with dementia. She is a former Engineer, civil (consulting), and is on disability due to chronic pain syndrome with constant meck. Bilateral shoulder tension. Was on Sinemet in the past due to tremors, " did not help". In today's visit, there is no head titubation.  She takes propanolol but the med makes her dizzier. She was seen for this and diagnosed with psychogenic movement disorder.   MRI brain wo contrast 06/06/21  Negative examination without acute abnormalities in the brainstem or cerebellum. No infarction, . Mild age related volume loss. No old or acute focal insult.    CT Angio Head W or Wo Contrast   Result Date: 06/06/2021  Brain: Mild age related volume loss. No evidence of old or acute focal infarction, mass lesion, hemorrhage, hydrocephalus or extra-axial collection. Vascular: No abnormal vascular finding.   Negative CT angiography of the neck and head. No atherosclerotic disease at either carotid bifurcation. No intracranial large or medium vessel occlusion or correctable proximal stenosis. 18 mm nodule of the thyroid isthmus. Recommend thyroid USQuestion pericardial fluid in the superior recess. This is not definitely pathologic.     Labs 06/06/21  TSH 0.73 CBC normal CMP normal Lipid panel  normal     Allergies  Allergen Reactions   Reglan [Metoclopramide] Other (See Comments)    Suicidal thoughts   Ciprofloxacin Diarrhea   Clindamycin/Lincomycin Diarrhea   Vibramycin [Doxycycline] Diarrhea   Macrobid [Nitrofurantoin Monohyd Macro] Rash    Rash on lip   Sulfa Antibiotics Rash    Current Outpatient Medications  Medication Instructions   acetaminophen (TYLENOL) 1,000 mg, Oral, Every 6 hours PRN   cephALEXin (KEFLEX) 250 mg, Daily   cholecalciferol (VITAMIN D) 1,000 Units, Oral, Daily    co-enzyme Q-10 30 mg, Oral, Daily   Cyanocobalamin (B-12 PO) Oral   FERROUS SULFATE PO Oral   flecainide (TAMBOCOR) 100 mg, Oral, 2 times daily   gabapentin (NEURONTIN) 300 mg, Oral, Daily at bedtime   LORazepam (ATIVAN) 1 mg, 3 times daily   Melatonin 3 MG TABS 2 tablets, Oral, Daily at bedtime   Multiple Vitamin (MULTIVITAMIN WITH MINERALS) TABS tablet 1 tablet, Oral, Daily   ondansetron (ZOFRAN ODT) 4 mg, Oral, Every 8 hours PRN   pantoprazole (PROTONIX) 20 mg, Oral, 2 times daily before meals, Take 2 tablets daily.   rivaroxaban (XARELTO) 20 mg, Oral, Daily with supper   rosuvastatin (CRESTOR) 20 mg, Oral, Daily   sertraline (ZOLOFT) 50 mg, Oral, Daily   traZODone (DESYREL) 150 mg, Oral, Daily at bedtime     VITALS:   Vitals:   06/14/21 0806  BP: 134/76  Pulse: 76  Resp: 18  SpO2: 96%  Weight: 149 lb (67.6 kg)  Height: 5\' 7"  (1.702 m)   Depression screen [facility] 2/9 06/06/2021 09/06/2020 02/07/2020 10/28/2019 08/08/2019  Decreased Interest 1 0 0 0 0  Down, Depressed, Hopeless 1 1 2 1  0  PHQ - 2 Score 2 1 2 1  0  Altered  sleeping 0 - 0 0 0  Tired, decreased energy 3 - 2 1 0  Change in appetite 1 - 0 1 0  Feeling bad or failure about yourself  3 - 0 0 0  Trouble concentrating 2 - 1 0 0  Moving slowly or fidgety/restless 2 - 0 0 0  Suicidal thoughts 2 - 0 0 0  PHQ-9 Score 15 - 5 3 0  Difficult doing work/chores Very difficult - Not difficult at all Not difficult at all Not difficult at all  Some recent data might be hidden    PHYSICAL EXAM   HEENT:  Normocephalic, atraumatic. The mucous membranes are moist. The superficial temporal arteries are without ropiness or tenderness. Cardiovascular: Regular rate and rhythm. Lungs: Clear to auscultation bilaterally. Neck: There are no carotid bruits noted bilaterally.  NEUROLOGICAL: Montreal Cognitive Assessment  06/14/2021  Visuospatial/ Executive (0/5) 3  Naming (0/3) 3  Attention: Read list of digits (0/2) 2  Attention: Read  list of letters (0/1) 1  Attention: Serial 7 subtraction starting at 100 (0/3) 3  Language: Repeat phrase (0/2) 1  Language : Fluency (0/1) 0  Abstraction (0/2) 1  Delayed Recall (0/5) 2  Orientation (0/6) 6  Total 22   No flowsheet data found.  No flowsheet data found.   Orientation:  Alert and oriented to person, place and time. No aphasia or dysarthria. Fund of knowledge is appropriate. Recent memory impaired and remote memory intact.  Attention and concentration are normal.  Able to name objects and repeat phrases. Delayed recall  2/5 Cranial nerves: There is good facial symmetry. Extraocular muscles are intact and visual fields are full to confrontational testing. Speech is fluent and clear. Soft palate rises symmetrically and there is no tongue deviation. Hearing is intact to conversational tone. Tone: Tone is good throughout. No head titubation is noted . No cogwheeling is noted  Sensation: Sensation is intact to light touch and pinprick throughout. Vibration is intact at the bilateral big toe.There is no extinction with double simultaneous stimulation. There is no sensory dermatomal level identified. Coordination: The patient has no difficulty with RAM's or FNF bilaterally. Normal finger to nose  Tremors: mild intentional tremors bilaterally Motor: Strength is 5/5 in the bilateral upper and lower extremities. There is no pronator drift. There are no fasciculations noted. DTR's: Deep tendon reflexes are 2/4 at the bilateral biceps, triceps, brachioradialis, patella and achilles.  Plantar responses are downgoing bilaterally. Gait and Station: The patient is able to ambulate with cautious steps, has unstable gait. Leans to the right. Narrow gait  Thank you for allowing [place] the opportunity to participate in the care of this nice patient. Please do not hesitate to contact us for any questions or concerns.   Total time spent on today's visit was 60 minutes, including both face-to-face time  and nonface-to-face time.  Time included that spent on review of records (prior notes available to me/labs/imaging if pertinent), discussing treatment and goals, answering patient's questions and coordinating care.  Cc:  Shelva Majestic, MD  Marlowe Kays 06/15/2021 3:59 PM

## 2021-06-13 NOTE — Telephone Encounter (Signed)
Patient states a cane was discussed during her visit today with Dr. Tenny Craw. She states she spoke with [employer] Pouch and they are requiring a prior authorization from Dr. Tenny Craw for coverage. Please assist.

## 2021-06-14 ENCOUNTER — Ambulatory Visit: Payer: Medicare HMO | Admitting: Physician Assistant

## 2021-06-14 ENCOUNTER — Encounter: Payer: Self-pay | Admitting: Physician Assistant

## 2021-06-14 VITALS — BP 134/76 | HR 76 | Resp 18 | Ht 67.0 in | Wt 149.0 lb

## 2021-06-14 DIAGNOSIS — R413 Other amnesia: Secondary | ICD-10-CM | POA: Diagnosis not present

## 2021-06-17 NOTE — Telephone Encounter (Signed)
Per Dr. Tenny Craw a cane was not discussed at her visit last Thursday.  I called the pt and adv she could speak with her primary care physician regarding this, or she could probably get one at the pharmacy/[facility] and submit receipt insurance for reimbursement if coverage applies.  She thanked me for call.  No other needs at this time.

## 2021-06-18 ENCOUNTER — Telehealth: Payer: Self-pay | Admitting: Physician Assistant

## 2021-06-18 ENCOUNTER — Other Ambulatory Visit: Payer: Self-pay

## 2021-06-18 ENCOUNTER — Other Ambulatory Visit (INDEPENDENT_AMBULATORY_CARE_PROVIDER_SITE_OTHER): Payer: Medicare HMO

## 2021-06-18 DIAGNOSIS — R413 Other amnesia: Secondary | ICD-10-CM

## 2021-06-18 LAB — VITAMIN B12: Vitamin B-12: 960 pg/mL — ABNORMAL HIGH (ref 211–911)

## 2021-06-18 NOTE — Telephone Encounter (Signed)
Patient wants Danielle Rocha to be aware she had a pretty large overdose of ativan and pain medication around 2016.   After that they symptoms started. She said she forgot to mention this at her last visit.  Patient came in for labs today.

## 2021-06-19 ENCOUNTER — Encounter: Payer: Self-pay | Admitting: Family Medicine

## 2021-06-20 DIAGNOSIS — R69 Illness, unspecified: Secondary | ICD-10-CM | POA: Diagnosis not present

## 2021-06-21 ENCOUNTER — Other Ambulatory Visit: Payer: Self-pay

## 2021-06-21 DIAGNOSIS — R531 Weakness: Secondary | ICD-10-CM

## 2021-07-03 ENCOUNTER — Ambulatory Visit
Admission: RE | Admit: 2021-07-03 | Discharge: 2021-07-03 | Disposition: A | Discharge status: Home / Self Care | Payer: Medicare HMO | Source: Ambulatory Visit | Attending: Family Medicine | Admitting: Family Medicine

## 2021-07-03 ENCOUNTER — Other Ambulatory Visit (HOSPITAL_COMMUNITY)
Admission: RE | Admit: 2021-07-03 | Discharge: 2021-07-03 | Disposition: A | Discharge status: Home / Self Care | Payer: Medicare HMO | Source: Ambulatory Visit | Attending: Family Medicine | Admitting: Family Medicine

## 2021-07-03 DIAGNOSIS — R69 Illness, unspecified: Secondary | ICD-10-CM | POA: Diagnosis not present

## 2021-07-03 DIAGNOSIS — E041 Nontoxic single thyroid nodule: Secondary | ICD-10-CM | POA: Diagnosis not present

## 2021-07-03 DIAGNOSIS — F331 Major depressive disorder, recurrent, moderate: Secondary | ICD-10-CM | POA: Diagnosis not present

## 2021-07-04 ENCOUNTER — Other Ambulatory Visit: Payer: Self-pay | Admitting: Radiology

## 2021-07-04 LAB — CYTOLOGY - NON PAP

## 2021-07-15 ENCOUNTER — Ambulatory Visit: Payer: Medicare HMO | Admitting: Physical Therapy

## 2021-07-15 ENCOUNTER — Other Ambulatory Visit: Payer: Self-pay

## 2021-07-15 ENCOUNTER — Encounter: Payer: Self-pay | Admitting: Physical Therapy

## 2021-07-15 DIAGNOSIS — R2689 Other abnormalities of gait and mobility: Secondary | ICD-10-CM

## 2021-07-15 DIAGNOSIS — R531 Weakness: Secondary | ICD-10-CM

## 2021-07-16 DIAGNOSIS — F331 Major depressive disorder, recurrent, moderate: Secondary | ICD-10-CM | POA: Diagnosis not present

## 2021-07-16 DIAGNOSIS — R69 Illness, unspecified: Secondary | ICD-10-CM | POA: Diagnosis not present

## 2021-07-17 ENCOUNTER — Other Ambulatory Visit: Payer: Self-pay | Admitting: Family Medicine

## 2021-07-17 DIAGNOSIS — Z1231 Encounter for screening mammogram for malignant neoplasm of breast: Secondary | ICD-10-CM

## 2021-07-23 ENCOUNTER — Encounter: Payer: Medicare HMO | Admitting: Physical Therapy

## 2021-07-23 ENCOUNTER — Encounter: Payer: Self-pay | Admitting: Physical Therapy

## 2021-07-23 NOTE — Therapy (Signed)
[facility] [address] [place], [place], [address] Phone: [phone]   Fax:  [phone]  Physical Therapy Evaluation  Patient Details  Name: Danielle Rocha MRN: [id number] Date of Birth: 03-29-1951 Referring Provider (PT): Garret Reddish   Encounter Date: 07/15/2021   PT End of Session - 07/23/21 0948     Visit Number 1    Number of Visits 12    Date for PT Re-Evaluation 08/26/21    Authorization Type [employer]    PT Start Time 1018    PT Stop Time 1100    PT Time Calculation (min) 42 min    Activity Tolerance Patient tolerated treatment well    Behavior During Therapy [facility] for tasks assessed/performed             Past Medical History:  Diagnosis Date   Arthritis    DJD, low back, thumb   Atrial fibrillation (HCC)    Xarelto anticoagulation. Flecainide antiarrythmic    Chicken pox    Chronic pain syndrome    On disability. History of bilateral hip pain, low back pain. Gabapentin 400 BID, percocet 1 tablet daily per prior provider.    Colon polyp    awaiting records   Depression    zoloft 100mg , remeron 30mg  per psychiatry. ambien 10mg  per psychiatry to help wtih sleep element.    Dystonia    described as psychogenic dystonia. Pain and twisting from upper chest and up with triggers "wind, creamy food" on TID ativan per psychiatry previously.    Fibromyalgia    GERD (gastroesophageal reflux disease)    omeprazole OTC   Goiter    states multiple imaging tests, has had biopsies   Hyperlipidemia    lovastatin 20mg    Stroke (HCC)    TIA (left side of face and body decreased sensitivity than right face and side)   TIA (transient ischemic attack)    Vaginal atrophy    estrace vaginal cream    Past Surgical History:  Procedure Laterality Date   ATRIAL FIBRILLATION ABLATION     BIOPSY THYROID     fusion l4-l5  09/22/1998   LAMINECTOMY     L4-L5   OTHER SURGICAL HISTORY     tennis elbow surgery    piriformis release  09/22/1997   right hip   TONSILLECTOMY AND ADENOIDECTOMY  age 70   VAGINAL HYSTERECTOMY  09/23/1991    There were no vitals filed for this visit.    Subjective Assessment - 07/23/21 0933     Subjective Pt reports onset of new symptoms that worsented over a couple months. She reports feeling brain fog, falling several times, repeating herself, and diffiuclty with word finding, was getting lost in car. Her son took her to MD, has had several workups, with cardiology, neurology. She did stop gabapentin, decreased wellbutrin, and trazadone. All workups negative for TIA, stroke, etc.  She feels significnat improvment since this time.  For at least 1-2 weeks, she has felt resolution of her symptoms.  Was able to be away at beach this past week. Today she feels 100% of symptoms that she was haivng are resoved, no dizziness today. She does report residual (or ongoing) weakness in her Legs, and ongoig  hip pain. She was seen previously in PT for chronic hip pain wihtout much improvement. Currently not doing HEP for that. Pt would like to work on Leg strengthening.    Limitations Standing;House hold activities;Walking    Patient Stated Goals leg strengthening and  balance.    Currently in Pain? Yes    Pain Score 4     Pain Location Hip    Pain Orientation Right;Left;Anterior    Pain Descriptors / Indicators Aching    Pain Type Chronic pain    Pain Radiating Towards also in low back/SI    Pain Onset More than a month ago    Pain Frequency Intermittent    Aggravating Factors  sitting too long, walking too long                [facility] PT Assessment - 07/23/21 0001       Assessment   Medical Diagnosis Weakness    Referring Provider (PT) Garret Reddish    Prior Therapy for hip pain      Precautions   Precautions None      Balance Screen   Has the patient fallen in the past 6 months Yes    How many times? 5    Has the patient had a decrease in activity level because of a  fear of falling?  No    Is the patient reluctant to leave their home because of a fear of falling?  No      Prior Function   Level of Independence Independent      Cognition   Overall Cognitive Status Within Functional Limits for tasks assessed      AROM   Overall AROM Comments LEs: WNL, Hips: mild limitation for flex and ER.      Strength   Overall Strength Comments Hips: 4-/5, Knees: 4+/5      Balance   Balance Assessed Yes      Standardized Balance Assessment   Standardized Balance Assessment Berg Balance Test;Five Times Sit to Stand    Five times sit to stand comments  12 sec. no hands. regular chair height.      Berg Balance Test   Sit to Stand Able to stand without using hands and stabilize independently    Standing Unsupported Able to stand safely 2 minutes    Sitting with Back Unsupported but Feet Supported on Floor or Stool Able to sit safely and securely 2 minutes    Stand to Sit Sits safely with minimal use of hands    Transfers Able to transfer safely, minor use of hands    Standing Unsupported with Eyes Closed Able to stand 10 seconds safely    Standing Unsupported with Feet Together Able to place feet together independently and stand for 1 minute with supervision    From Standing, Reach Forward with Outstretched Arm Can reach confidently >25 cm (10")    From Standing Position, Pick up Object from Floor Able to pick up shoe safely and easily    From Standing Position, Turn to Look Behind Over each Shoulder Looks behind one side only/other side shows less weight shift    Turn 360 Degrees Able to turn 360 degrees safely one side only in 4 seconds or less    Standing Unsupported, Alternately Place Feet on Step/Stool Able to stand independently and complete 8 steps >20 seconds    Standing Unsupported, One Foot in Front Able to take small step independently and hold 30 seconds    Standing on One Leg Tries to lift leg/unable to hold 3 seconds but remains standing  independently    Total Score 47                        Objective  measurements completed on examination: See above findings.                PT Education - 07/23/21 0955     Education Details PT POC, Exam findings,    Person(s) Educated Patient    Methods Explanation;Demonstration;Tactile cues;Verbal cues    Comprehension Verbalized understanding;Returned demonstration;Verbal cues required;Tactile cues required;Need further instruction              PT Short Term Goals - 07/23/21 1001       PT SHORT TERM GOAL #1   Title Pt to be independent with initial HEP    Time 2    Period Weeks    Status New    Target Date 07/29/21               PT Long Term Goals - 07/23/21 1001       PT LONG TERM GOAL #1   Title Pt to be independent with final HEP for LE strengthening and hip pain    Time 6    Period Weeks    Status New    Target Date 08/26/21      PT LONG TERM GOAL #2   Title Pt to demo improved hip strength to at least 4+/5 to improve stability and hip pain.    Time 6    Period Weeks    Status New    Target Date 08/26/21      PT LONG TERM GOAL #3   Title Pt to demo improved score of BERG by at least 5 points, to improve fall risk.    Time 6    Period Weeks    Status New    Target Date 08/26/21                    Plan - 07/23/21 0955     Clinical Impression Statement Pt presents wtih primary complaint of weakness in Legs and some increased difficulty with functional activity. She was having significant symptoms that seem to have resolved at this time. She does have mild weakness in LEs, hips, and mild dynamic balance deficits with testing today. She has chronic hip pain, and will benefit from learning HEP for management of this.Pt to benefit from skilled PT for safety with functional activity, dynamic balance, and LE strengthening.    Examination-Activity Limitations Stand;Locomotion Level;Stairs    Examination-Participation  Restrictions Shop;Community Activity;Cleaning    Stability/Clinical Decision Making Stable/Uncomplicated    Clinical Decision Making Low    Rehab Potential Good    PT Frequency 2x / week    PT Duration 6 weeks    PT Treatment/Interventions ADLs/Self Care Home Management;Cryotherapy;Electrical Stimulation;DME Instruction;Ultrasound;Traction;Moist Heat;Iontophoresis 4mg /ml Dexamethasone;Gait training;Stair training;Functional mobility training;Therapeutic activities;Therapeutic exercise;Balance training;Patient/family education;Neuromuscular re-education;Manual techniques;Taping;Passive range of motion;Dry needling;Spinal Manipulations;Joint Manipulations;Vestibular    Consulted and Agree with Plan of Care Patient             Patient will benefit from skilled therapeutic intervention in order to improve the following deficits and impairments:  Abnormal gait, Pain, Increased muscle spasms, Decreased mobility, Decreased coordination, Decreased strength, Difficulty walking, Decreased balance  Visit Diagnosis: Weakness  Other abnormalities of gait and mobility     Problem List Patient Active Problem List   Diagnosis Date Noted   Involuntary movements 02/14/2020   Depression with anxiety 02/14/2020   Aortic atherosclerosis (HCC) 02/07/2020   Delayed gastric emptying 02/07/2020   Dystonia    Restless legs 07/27/2018   Recurrent UTI 10/07/2016  Major depression in full remission ([place]) 09/02/2016   GERD (gastroesophageal reflux disease) 01/30/2016   History of SI/intentional drug overdose 11/22/2015   Atrial fibrillation ([place])    Chronic pain syndrome    Hyperlipidemia    Lyndee Hensen, PT, DPT 10:12 AM  07/23/21    [facility] [place], [place], [address] Phone: [phone]   Fax:  [phone]  Name: Danielle Rocha MRN: [id number] Date of Birth: [birth year]

## 2021-07-30 ENCOUNTER — Ambulatory Visit (INDEPENDENT_AMBULATORY_CARE_PROVIDER_SITE_OTHER): Payer: Medicare HMO | Admitting: Family

## 2021-07-30 ENCOUNTER — Encounter: Payer: Self-pay | Admitting: Family

## 2021-07-30 ENCOUNTER — Other Ambulatory Visit: Payer: Self-pay

## 2021-07-30 VITALS — BP 112/64 | HR 61 | Temp 98.2°F | Ht 67.0 in | Wt 147.4 lb

## 2021-07-30 DIAGNOSIS — J3489 Other specified disorders of nose and nasal sinuses: Secondary | ICD-10-CM | POA: Diagnosis not present

## 2021-07-30 MED ORDER — MUPIROCIN 2 % EX OINT: 1.0000 "application " | TOPICAL_OINTMENT | Freq: Two times a day (BID) | CUTANEOUS | 0 refills | Status: DC

## 2021-07-30 NOTE — Progress Notes (Signed)
Subjective:     Patient ID: Danielle Rocha, female    DOB: 11-03-1950, 70 y.o.   MRN: [id number]  Chief Complaint  Patient presents with   Nose sore    Pt noticed 1 month ago in left nostril. Mild bleeding and pain.     HPI Nose sore:  pt has a painful sore in inside left nostril that has bled at times. Pt reports hx of MRSA in same nostril 10 years ago requiring IV abt with a drain in the hospital. Has had no problems since that time. Denies picking or pulling at a scab. She has not applied any cream or antibiotic to the sore.   Health Maintenance Due  Topic Date Due   Zoster Vaccines- Shingrix (1 of 2) Never done   MAMMOGRAM  03/24/2020    Past Medical History:  Diagnosis Date   Arthritis    DJD, low back, thumb   Atrial fibrillation (HCC)    Xarelto anticoagulation. Flecainide antiarrythmic    Chicken pox    Chronic pain syndrome    On disability. History of bilateral hip pain, low back pain. Gabapentin 400 BID, percocet 1 tablet daily per prior provider.    Colon polyp    awaiting records   Depression    zoloft 100mg , remeron 30mg  per psychiatry. ambien 10mg  per psychiatry to help wtih sleep element.    Dystonia    described as psychogenic dystonia. Pain and twisting from upper chest and up with triggers "wind, creamy food" on TID ativan per psychiatry previously.    Fibromyalgia    GERD (gastroesophageal reflux disease)    omeprazole OTC   Goiter    states multiple imaging tests, has had biopsies   Hyperlipidemia    lovastatin 20mg    Stroke (HCC)    TIA (left side of face and body decreased sensitivity than right face and side)   TIA (transient ischemic attack)    Vaginal atrophy    estrace vaginal cream    Past Surgical History:  Procedure Laterality Date   ATRIAL FIBRILLATION ABLATION     BIOPSY THYROID     fusion l4-l5  09/22/1998   LAMINECTOMY     L4-L5   OTHER SURGICAL HISTORY     tennis elbow surgery   piriformis release  09/22/1997   right hip    TONSILLECTOMY AND ADENOIDECTOMY  age 59   VAGINAL HYSTERECTOMY  09/23/1991    Outpatient Medications Prior to Visit  Medication Sig Dispense Refill   acetaminophen (TYLENOL) 500 MG tablet Take 1,000 mg by mouth every 6 (six) hours as needed for mild pain, moderate pain, fever or headache.     buPROPion (WELLBUTRIN XL) 150 MG 24 hr tablet Take 450 mg by mouth every morning.     cholecalciferol (VITAMIN D) 1000 units tablet Take 1,000 Units by mouth daily.     co-enzyme Q-10 30 MG capsule Take 30 mg by mouth daily.     Cyanocobalamin (B-12 PO) Take by mouth.     FERROUS SULFATE PO Take by mouth.     flecainide (TAMBOCOR) 100 MG tablet Take 1 tablet (100 mg total) by mouth 2 (two) times daily. 180 tablet 3   LORazepam (ATIVAN) 1 MG tablet Take 1 mg by mouth 3 (three) times daily.     Melatonin 3 MG TABS Take 2 tablets by mouth at bedtime.      Multiple Vitamin (MULTIVITAMIN WITH MINERALS) TABS tablet Take 1 tablet by mouth daily.  ondansetron (ZOFRAN ODT) 4 MG disintegrating tablet Take 1 tablet (4 mg total) by mouth every 8 (eight) hours as needed for nausea or vomiting. 20 tablet 0   pantoprazole (PROTONIX) 20 MG tablet Take 1 tablet (20 mg total) by mouth 2 (two) times daily before a meal. Take 2 tablets daily. 180 tablet 3   rivaroxaban (XARELTO) 20 MG TABS tablet Take 1 tablet (20 mg total) by mouth daily with supper. 90 tablet 3   rosuvastatin (CRESTOR) 20 MG tablet Take 1 tablet (20 mg total) by mouth daily. 90 tablet 3   sertraline (ZOLOFT) 50 MG tablet Take 1 tablet (50 mg total) by mouth daily. 30 tablet 6   cephALEXin (KEFLEX) 250 MG capsule Take 250 mg by mouth daily. Takes daily for frequent UTIs. (Patient not taking: No sig reported)     gabapentin (NEURONTIN) 300 MG capsule Take 300 mg by mouth at bedtime.  (Patient not taking: Reported on 07/30/2021)     traZODone (DESYREL) 150 MG tablet Take 150 mg by mouth at bedtime.  (Patient not taking: Reported on 07/30/2021)     No  facility-administered medications prior to visit.    Allergies  Allergen Reactions   Reglan [Metoclopramide] Other (See Comments)    Suicidal thoughts   Ciprofloxacin Diarrhea   Clindamycin/Lincomycin Diarrhea   Vibramycin [Doxycycline] Diarrhea   Macrobid [Nitrofurantoin Monohyd Macro] Rash    Rash on lip   Sulfa Antibiotics Rash        Objective:    Physical Exam Vitals and nursing note reviewed.  Constitutional:      Appearance: Normal appearance.  Cardiovascular:     Rate and Rhythm: Normal rate and regular rhythm.  Pulmonary:     Effort: Pulmonary effort is normal.     Breath sounds: Normal breath sounds.  Musculoskeletal:        General: Normal range of motion.  Skin:    General: Skin is warm and dry.     Findings: Lesion (just inside left nostril, septal side) present.  Neurological:     Mental Status: She is alert.  Psychiatric:        Mood and Affect: Mood normal.        Behavior: Behavior normal.    BP 112/64   Pulse 61   Temp 98.2 F (36.8 C) (Temporal)   Ht 5\' 7"  (1.702 m)   Wt 147 lb 6.4 oz (66.9 kg)   LMP  (LMP Unknown)   SpO2 98%   BMI 23.09 kg/m  Wt Readings from Last 3 Encounters:  07/30/21 147 lb 6.4 oz (66.9 kg)  06/14/21 149 lb (67.6 kg)  06/13/21 148 lb 3.2 oz (67.2 kg)       Assessment & Plan:   Problem List Items Addressed This Visit       Other   Sore in nose - Primary    Mupirocin ointment sent to pharmacy, if not improving within a week, pt instructed to call back and will refer to ENT.      Relevant Medications   mupirocin ointment (BACTROBAN) 2 %    Meds ordered this encounter  Medications   mupirocin ointment (BACTROBAN) 2 %    Sig: Place 1 application into the nose 2 (two) times daily.    Dispense:  22 g    Refill:  0    Order Specific Question:   Supervising Provider    Answer:   ANDY, CAMILLE L [[id number]]

## 2021-07-30 NOTE — Assessment & Plan Note (Signed)
Mupirocin ointment sent to pharmacy, if not improving within a week, pt instructed to call back and will refer to ENT.

## 2021-07-30 NOTE — Patient Instructions (Signed)
It was very nice to see you today!  I have sent the antibacterial ointment to your pharmacy, start this today. As discussed, if symptoms are not improving within a week, call or send a message through MyChart and we send a referral to ENT.   PLEASE NOTE:  If you had any lab tests please let us know if you have not heard back within a few days. You may see your results on mychart before we have a chance to review them but we will give you a call once they are reviewed by [place]. If we ordered any referrals today, please let us know if you have not heard from their office within the next week.   Please try these tips to maintain a healthy lifestyle:  Eat most of your calories during the day when you are active. Eliminate processed foods including packaged sweets (pies, cakes, cookies), reduce intake of potatoes, white bread, white pasta, and white rice. Look for whole grain options, oat flour or almond flour.  Each meal should contain half fruits/vegetables, one quarter protein, and one quarter carbs (no bigger than a computer mouse).  Cut down on sweet beverages. This includes juice, soda, and sweet tea. Also watch fruit intake, though this is a healthier sweet option, it still contains natural sugar! Limit to 3 servings daily.  Drink at least 1 glass of water with each meal and aim for at least 8 glasses per day  Exercise at least 150 minutes every week.

## 2021-07-31 ENCOUNTER — Encounter: Payer: Medicare HMO | Admitting: Physical Therapy

## 2021-08-06 ENCOUNTER — Encounter: Payer: Medicare HMO | Admitting: Physical Therapy

## 2021-08-06 ENCOUNTER — Other Ambulatory Visit: Payer: Self-pay

## 2021-08-06 ENCOUNTER — Telehealth: Payer: Self-pay

## 2021-08-06 DIAGNOSIS — J3489 Other specified disorders of nose and nasal sinuses: Secondary | ICD-10-CM

## 2021-08-06 NOTE — Telephone Encounter (Signed)
Referral placed. Pt aware. 

## 2021-08-06 NOTE — Telephone Encounter (Signed)
Pt called regarding a referral. Pt stated that she saw [name] on 11/8 and she is still having issues with her nose. Pt stated that [name] told her to call back in a week for ENT referral to be placed. Danielle Rocha is wanting the referral. Please Advise.

## 2021-08-13 ENCOUNTER — Encounter: Payer: Medicare HMO | Admitting: Physical Therapy

## 2021-08-26 DIAGNOSIS — F3341 Major depressive disorder, recurrent, in partial remission: Secondary | ICD-10-CM | POA: Diagnosis not present

## 2021-08-26 DIAGNOSIS — R69 Illness, unspecified: Secondary | ICD-10-CM | POA: Diagnosis not present

## 2021-08-27 ENCOUNTER — Ambulatory Visit
Admission: RE | Admit: 2021-08-27 | Discharge: 2021-08-27 | Disposition: A | Discharge status: Home / Self Care | Payer: Medicare HMO | Source: Ambulatory Visit | Attending: Family Medicine | Admitting: Family Medicine

## 2021-08-27 DIAGNOSIS — Z1231 Encounter for screening mammogram for malignant neoplasm of breast: Secondary | ICD-10-CM | POA: Diagnosis not present

## 2021-09-03 DIAGNOSIS — J3489 Other specified disorders of nose and nasal sinuses: Secondary | ICD-10-CM | POA: Diagnosis not present

## 2021-09-09 ENCOUNTER — Ambulatory Visit (INDEPENDENT_AMBULATORY_CARE_PROVIDER_SITE_OTHER): Payer: Medicare HMO

## 2021-09-09 ENCOUNTER — Other Ambulatory Visit: Payer: Self-pay

## 2021-09-09 DIAGNOSIS — Z0000 Encounter for general adult medical examination without abnormal findings: Secondary | ICD-10-CM

## 2021-09-09 NOTE — Progress Notes (Signed)
Virtual Visit via Telephone Note  I connected with  Danielle Rocha on 09/09/21 at  2:30 PM EST by telephone and verified that I am speaking with the correct person using two identifiers.  Medicare Annual Wellness visit completed telephonically due to Covid-19 pandemic.   Persons participating in this call: This Health Coach and this patient.   Location: Patient: Home Provider: Office   I discussed the limitations, risks, security and privacy concerns of performing an evaluation and management service by telephone and the availability of in person appointments. The patient expressed understanding and agreed to proceed.  Unable to perform video visit due to video visit attempted and failed and/or patient does not have video capability.   Some vital signs may be absent or patient reported.   Willette Brace, LPN   Subjective:   Danielle Rocha is a 70 y.o. female who presents for Medicare Annual (Subsequent) preventive examination.  Review of Systems     Cardiac Risk Factors include: dyslipidemia;advanced age (>44men, >7 women)     Objective:    Today's Vitals   09/09/21 1433  PainSc: 7    There is no height or weight on file to calculate BMI.  Advanced Directives 09/09/2021 07/23/2021 06/14/2021 03/20/2021 09/06/2020 01/02/2020 08/04/2019  Does Patient Have a Medical Advance Directive? No No No No No No Yes  Type of Advance Directive - - - - - - Living will;Healthcare Power of Attorney  Does patient want to make changes to medical advance directive? - - - - - - No - Patient declined  Copy of [place] in Chart? - - - - - - No - copy requested  Would patient like information on creating a medical advance directive? Yes (MAU/Ambulatory/Procedural Areas - Information given) No - Patient declined - - Yes (MAU/Ambulatory/Procedural Areas - Information given) No - Patient declined -  Some encounter information is confidential and restricted. Go to Review  Flowsheets activity to see all data.    Current Medications (verified) Outpatient Encounter Medications as of 09/09/2021  Medication Sig   acetaminophen (TYLENOL) 500 MG tablet Take 1,000 mg by mouth every 6 (six) hours as needed for mild pain, moderate pain, fever or headache.   buPROPion (WELLBUTRIN XL) 150 MG 24 hr tablet Take 450 mg by mouth every morning.   cholecalciferol (VITAMIN D) 1000 units tablet Take 1,000 Units by mouth daily.   co-enzyme Q-10 30 MG capsule Take 30 mg by mouth daily.   Cyanocobalamin (B-12 PO) Take by mouth.   FERROUS SULFATE PO Take by mouth.   flecainide (TAMBOCOR) 100 MG tablet Take 1 tablet (100 mg total) by mouth 2 (two) times daily.   Melatonin 3 MG TABS Take 2 tablets by mouth at bedtime.    Multiple Vitamin (MULTIVITAMIN WITH MINERALS) TABS tablet Take 1 tablet by mouth daily.   mupirocin ointment (BACTROBAN) 2 % Place 1 application into the nose 2 (two) times daily.   ondansetron (ZOFRAN ODT) 4 MG disintegrating tablet Take 1 tablet (4 mg total) by mouth every 8 (eight) hours as needed for nausea or vomiting.   pantoprazole (PROTONIX) 20 MG tablet Take 1 tablet (20 mg total) by mouth 2 (two) times daily before a meal. Take 2 tablets daily.   rivaroxaban (XARELTO) 20 MG TABS tablet Take 1 tablet (20 mg total) by mouth daily with supper.   rosuvastatin (CRESTOR) 20 MG tablet Take 1 tablet (20 mg total) by mouth daily.   sertraline (ZOLOFT) 50 MG tablet Take  1 tablet (50 mg total) by mouth daily.   cephALEXin (KEFLEX) 250 MG capsule Take 250 mg by mouth daily. Takes daily for frequent UTIs. (Patient not taking: No sig reported)   [DISCONTINUED] gabapentin (NEURONTIN) 300 MG capsule Take 300 mg by mouth at bedtime.  (Patient not taking: Reported on 09/09/2021)   [DISCONTINUED] LORazepam (ATIVAN) 1 MG tablet Take 1 mg by mouth 3 (three) times daily. (Patient not taking: Reported on 09/09/2021)   [DISCONTINUED] traZODone (DESYREL) 150 MG tablet Take 150 mg by  mouth at bedtime.  (Patient not taking: Reported on 07/30/2021)   No facility-administered encounter medications on file as of 09/09/2021.    Allergies (verified) Reglan [metoclopramide], Ciprofloxacin, Clindamycin/lincomycin, Doxycycline, Macrobid [nitrofurantoin monohyd macro], and Sulfa antibiotics   History: Past Medical History:  Diagnosis Date   Arthritis    DJD, low back, thumb   Atrial fibrillation (HCC)    Xarelto anticoagulation. Flecainide antiarrythmic    Chicken pox    Chronic pain syndrome    On disability. History of bilateral hip pain, low back pain. Gabapentin 400 BID, percocet 1 tablet daily per prior provider.    Colon polyp    awaiting records   Depression    zoloft 100mg , remeron 30mg  per psychiatry. ambien 10mg  per psychiatry to help wtih sleep element.    Dystonia    described as psychogenic dystonia. Pain and twisting from upper chest and up with triggers "wind, creamy food" on TID ativan per psychiatry previously.    Fibromyalgia    GERD (gastroesophageal reflux disease)    omeprazole OTC   Goiter    states multiple imaging tests, has had biopsies   Hyperlipidemia    lovastatin 20mg    Stroke (HCC)    TIA (left side of face and body decreased sensitivity than right face and side)   TIA (transient ischemic attack)    Vaginal atrophy    estrace vaginal cream   Past Surgical History:  Procedure Laterality Date   ATRIAL FIBRILLATION ABLATION     BIOPSY THYROID     fusion l4-l5  09/22/1998   LAMINECTOMY     L4-L5   OTHER SURGICAL HISTORY     tennis elbow surgery   piriformis release  09/22/1997   right hip   TONSILLECTOMY AND ADENOIDECTOMY  age 23   VAGINAL HYSTERECTOMY  09/23/1991   Family History  Problem Relation Age of Onset   Alcohol abuse Mother        possible she had stomach cancer as well but unsure   Hypertension Father    Alcohol abuse Father    Pancreatic cancer Father    Cancer Brother        spindle cell RLE   Diabetes Brother     Cancer Brother        tonsil cancer   Skin cancer Brother    Colon cancer Neg Hx    Colon polyps Neg Hx    Stomach cancer Neg Hx    Social History   Socioeconomic History   Marital status: Married    Spouse name: Not on file   Number of children: 3   Years of education: 13   Highest education level: Not on file  Occupational History   Occupation: retired  Tobacco Use   Smoking status: Never   Smokeless tobacco: Never  Vaping Use   Vaping Use: Never used  Substance and Sexual Activity   Alcohol use: No    Alcohol/week: 0.0 standard drinks   Drug use:  No   Sexual activity: Not Currently    Partners: Male  Other Topics Concern   Not on file  Social History Narrative   Family: Married. Husband works as Geophysicist/field seismologist for [employer]. 2 children from previous marriage 1 from current. Son [name] lives with them and has down's syndrome.       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 Moved to [facility] from [id number] and became disabled in that time frame due to hip/back issues.       Hobbies: time with son   Right handed   One story home   Caffeine occasionally   Social Determinants of Health   Financial Resource Strain: Low Risk    Difficulty of Paying Living Expenses: Not hard at all  Food Insecurity: No Food Insecurity   Worried About Charity fundraiser in the Last Year: Never true   Arboriculturist in the Last Year: Never true  Transportation Needs: No Transportation Needs   Lack of Transportation (Medical): No   Lack of Transportation (Non-Medical): No  Physical Activity: Insufficiently Active   Days of Exercise per Week: 3 days   Minutes of Exercise per Session: 30 min  Stress: No Stress Concern Present   Feeling of Stress : Not at all  Social Connections: Unknown   Frequency of Communication with Friends and Family: More than three times a week   Frequency of Social Gatherings with Friends and Family: More than three times a week    Attends Religious Services: More than 4 times per year   Active Member of [employer] or Organizations: No   Attends Music therapist: Never   Marital Status: Not on file    Tobacco Counseling Counseling given: Not Answered   Clinical Intake:  Pre-visit preparation completed: Yes  Pain : 0-10 Pain Score: 7  Pain Type: Chronic pain Pain Location: Head (both hips) Pain Descriptors / Indicators: Aching, Sharp Pain Onset: More than a month ago Pain Frequency: Intermittent     BMI - recorded: 23.09 Nutritional Status: BMI of 19-24  Normal Nutritional Risks: None Diabetes: No  How often do you need to have someone help you when you read instructions, pamphlets, or other written materials from your doctor or pharmacy?: 1 - Never  Diabetic?No  Interpreter Needed?: No  Information entered by :: Charlott Rakes, LPN   Activities of Daily Living In your present state of health, do you have any difficulty performing the following activities: 09/09/2021  Hearing? N  Vision? N  Difficulty concentrating or making decisions? N  Walking or climbing stairs? N  Dressing or bathing? N  Doing errands, shopping? N  Preparing Food and eating ? N  Using the Toilet? N  In the past six months, have you accidently leaked urine? N  Do you have problems with loss of bowel control? N  Managing your Medications? N  Managing your Finances? N  Housekeeping or managing your Housekeeping? N  Some recent data might be hidden    Patient Care Team: Marin Olp, MD as PCP - General (Family Medicine) Fay Records, MD as PCP - Cardiology (Cardiology) Bjorn Loser, MD as Consulting Physician (Urology) Fay Records, MD as Consulting Physician (Cardiology) Norma Fredrickson, MD as Consulting Physician (Psychiatry)  Indicate any recent Medical Services you may have received from other than [facility] providers in the past year (date may be approximate).     Assessment:   This is a  routine wellness examination  for Antoine.  Hearing/Vision screen Hearing Screening - Comments:: Pt denies any hearing issues  Vision Screening - Comments:: [facility] follows up with [facility] or [facility]   Dietary issues and exercise activities discussed: Current Exercise Habits: Home exercise routine, Type of exercise: walking, Time (Minutes): 30, Frequency (Times/Week): 3, Weekly Exercise (Minutes/Week): 90   Goals Addressed             This Visit's Progress    Patient Stated       None at this time       Depression Screen PHQ 2/9 Scores 09/09/2021 06/06/2021 09/06/2020 02/07/2020 10/28/2019 08/08/2019 08/04/2019  PHQ - 2 Score 0 2 1 2 1  0 3  PHQ- 9 Score - 15 - 5 3 0 12    Fall Risk Fall Risk  09/09/2021 07/30/2021 06/14/2021 09/06/2020 11/03/2019  Falls in the past year? 1 0 1 0 0  Comment - - - - -  Number falls in past yr: 1 - 1 0 0  Injury with Fall? 1 - 0 0 0  Comment ankle sprain - - - -  Risk for fall due to : Impaired vision No Fall Risks - Impaired vision;Impaired balance/gait -  Risk for fall due to: Comment - - - - -  Follow up Falls prevention discussed - - Falls prevention discussed -    FALL RISK PREVENTION PERTAINING TO THE HOME:  Any stairs in or around the home? Yes  If so, are there any without handrails? No  Home free of loose throw rugs in walkways, pet beds, electrical cords, etc? Yes  Adequate lighting in your home to reduce risk of falls? Yes   ASSISTIVE DEVICES UTILIZED TO PREVENT FALLS:  Life alert? No  Use of a cane, walker or w/c? No  Grab bars in the bathroom? No  Shower chair or bench in shower? No  Elevated toilet seat or a handicapped toilet? No   TIMED UP AND GO:  Was the test performed? No .   Cognitive Function:   Montreal Cognitive Assessment  06/14/2021  Visuospatial/ Executive (0/5) 3  Naming (0/3) 3  Attention: Read list of digits (0/2) 2  Attention: Read list of letters (0/1) 1  Attention: Serial 7  subtraction starting at 100 (0/3) 3  Language: Repeat phrase (0/2) 1  Language : Fluency (0/1) 0  Abstraction (0/2) 1  Delayed Recall (0/5) 2  Orientation (0/6) 6  Total 22   6CIT Screen 09/09/2021 09/06/2020 07/29/2018  What Year? 0 points 0 points 0 points  What month? 0 points 0 points 0 points  What time? 0 points - 0 points  Count back from 20 0 points 0 points 0 points  Months in reverse 0 points 2 points 0 points  Repeat phrase 0 points 2 points 0 points  Total Score 0 - 0    Immunizations Immunization History  Administered Date(s) Administered   Fluad Quad(high Dose 65+) 07/24/2020, 06/06/2021   Influenza, High Dose Seasonal PF 07/29/2017, 05/27/2019   Influenza-Unspecified 06/09/2013, 06/06/2014, 07/04/2015, 07/26/2016, 07/26/2018   PFIZER(Purple Top)SARS-COV-2 Vaccination 11/13/2019, 12/07/2019   Pneumococcal Conjugate-13 04/27/2017   Pneumococcal Polysaccharide-23 09/23/2012, 10/28/2018   Td 09/23/2012   Tdap 06/13/2012   Zoster, Live 08/16/2012    TDAP status: Up to date  Flu Vaccine status: Up to date  Pneumococcal vaccine status: Up to date  Covid-19 vaccine status: Completed vaccines  Qualifies for Shingles Vaccine? Yes   Zostavax completed Yes   Shingrix Completed?: No.  Education has been provided regarding the importance of this vaccine. Patient has been advised to call insurance company to determine out of pocket expense if they have not yet received this vaccine. Advised may also receive vaccine at local pharmacy or Health Dept. Verbalized acceptance and understanding.  Screening Tests Health Maintenance  Topic Date Due   Zoster Vaccines- Shingrix (1 of 2) Never done   COVID-19 Vaccine (3 - Pfizer risk series) 01/04/2020   Hepatitis C Screening  06/22/2056 (Originally 04/24/1969)   TETANUS/TDAP  09/23/2022   MAMMOGRAM  08/28/2023   COLONOSCOPY (Pts 45-61yrs Insurance coverage will need to be confirmed)  01/01/2030   Pneumonia Vaccine 65+ Years  old  Completed   INFLUENZA VACCINE  Completed   DEXA SCAN  Completed   HPV [place] Maintenance Due  Topic Date Due   Zoster Vaccines- Shingrix (1 of 2) Never done   COVID-19 Vaccine (3 - Pfizer risk series) 01/04/2020    Colorectal cancer screening: Type of screening: Colonoscopy. Completed 01/02/20. Repeat every 10 years  Mammogram status: Completed 08/27/21. Repeat every year  Bone Density status: Completed 10/31/15. Results reflect: Bone density results: NORMAL. Repeat every 2 years.   Additional Screening:  Hepatitis C Screening: does qualify  Vision Screening: Recommended annual ophthalmology exams for early detection of glaucoma and other disorders of the eye. Is the patient up to date with their annual eye exam?  Yes  Who is the provider or what is the name of the office in which the patient attends annual eye exams? [facility] or [facility]  If pt is not established with a provider, would they like to be referred to a provider to establish care? No .   Dental Screening: Recommended annual dental exams for proper oral hygiene  Community Resource Referral / Chronic Care Management: CRR required this visit?  No   CCM required this visit?  No      Plan:     I have personally reviewed and noted the following in the patients chart:   Medical and social history Use of alcohol, tobacco or illicit drugs  Current medications and supplements including opioid prescriptions.  Functional ability and status Nutritional status Physical activity Advanced directives List of other physicians Hospitalizations, surgeries, and ER visits in previous 12 months Vitals Screenings to include cognitive, depression, and falls Referrals and appointments  In addition, I have reviewed and discussed with patient certain preventive protocols, quality metrics, and best practice recommendations. A written personalized care plan for  preventive services as well as general preventive health recommendations were provided to patient.     Willette Brace, LPN   [id number]   Nurse Notes: None

## 2021-09-09 NOTE — Patient Instructions (Signed)
Danielle Rocha , Thank you for taking time to come for your Medicare Wellness Visit. I appreciate your ongoing commitment to your health goals. Please review the following plan we discussed and let me know if I can assist you in the future.   Screening recommendations/referrals: Colonoscopy: Done 01/02/20 repeat every 10 years  Mammogram: Done 08/27/21 repeat every year  Bone Density: Done 10/31/15 repeat every 2 years  Recommended yearly ophthalmology/optometry visit for glaucoma screening and checkup Recommended yearly dental visit for hygiene and checkup  Vaccinations: Influenza vaccine: Done 06/06/21 Pneumococcal vaccine: Up to date Tdap vaccine: Done 09/23/12 repeat every 10 years  Shingles vaccine: Shingrix discussed. Please contact your pharmacy for coverage information.    Covid-19:Completed 2/21 & 12/07/19  Advanced directives: Please bring a copy of your health care power of attorney and living will to the office at your convenience.  Conditions/risks identified: None at this time  Next appointment: Follow up in one year for your annual wellness visit    Preventive Care 65 Years and Older, Female Preventive care refers to lifestyle choices and visits with your health care provider that can promote health and wellness. What does preventive care include? A yearly physical exam. This is also called an annual well check. Dental exams once or twice a year. Routine eye exams. Ask your health care provider how often you should have your eyes checked. Personal lifestyle choices, including: Daily care of your teeth and gums. Regular physical activity. Eating a healthy diet. Avoiding tobacco and drug use. Limiting alcohol use. Practicing safe sex. Taking low-dose aspirin every day. Taking vitamin and mineral supplements as recommended by your health care provider. What happens during an annual well check? The services and screenings done by your health care provider during your annual  well check will depend on your age, overall health, lifestyle risk factors, and family history of disease. Counseling  Your health care provider may ask you questions about your: Alcohol use. Tobacco use. Drug use. Emotional well-being. Home and relationship well-being. Sexual activity. Eating habits. History of falls. Memory and ability to understand (cognition). Work and work Astronomer. Reproductive health. Screening  You may have the following tests or measurements: Height, weight, and BMI. Blood pressure. Lipid and cholesterol levels. These may be checked every 5 years, or more frequently if you are over 24 years old. Skin check. Lung cancer screening. You may have this screening every year starting at age 70 if you have a 30-pack-year history of smoking and currently smoke or have quit within the past 15 years. Fecal occult blood test (FOBT) of the stool. You may have this test every year starting at age 44. Flexible sigmoidoscopy or colonoscopy. You may have a sigmoidoscopy every 5 years or a colonoscopy every 10 years starting at age 31. Hepatitis C blood test. Hepatitis B blood test. Sexually transmitted disease (STD) testing. Diabetes screening. This is done by checking your blood sugar (glucose) after you have not eaten for a while (fasting). You may have this done every 1-3 years. Bone density scan. This is done to screen for osteoporosis. You may have this done starting at age 38. Mammogram. This may be done every 1-2 years. Talk to your health care provider about how often you should have regular mammograms. Talk with your health care provider about your test results, treatment options, and if necessary, the need for more tests. Vaccines  Your health care provider may recommend certain vaccines, such as: Influenza vaccine. This is recommended every year. Tetanus,  diphtheria, and acellular pertussis (Tdap, Td) vaccine. You may need a Td booster every 10 years. Zoster  vaccine. You may need this after age 27. Pneumococcal 13-valent conjugate (PCV13) vaccine. One dose is recommended after age 71. Pneumococcal polysaccharide (PPSV23) vaccine. One dose is recommended after age 8. Talk to your health care provider about which screenings and vaccines you need and how often you need them. This information is not intended to replace advice given to you by your health care provider. Make sure you discuss any questions you have with your health care provider. Document Released: 10/05/2015 Document Revised: 05/28/2016 Document Reviewed: 07/10/2015 Elsevier Interactive Patient Education  2017 [place] Prevention in the Home Falls can cause injuries. They can happen to people of all ages. There are many things you can do to make your home safe and to help prevent falls. What can I do on the outside of my home? Regularly fix the edges of walkways and driveways and fix any cracks. Remove anything that might make you trip as you walk through a door, such as a raised step or threshold. Trim any bushes or trees on the path to your home. Use bright outdoor lighting. Clear any walking paths of anything that might make someone trip, such as rocks or tools. Regularly check to see if handrails are loose or broken. Make sure that both sides of any steps have handrails. Any raised decks and porches should have guardrails on the edges. Have any leaves, snow, or ice cleared regularly. Use sand or salt on walking paths during winter. Clean up any spills in your garage right away. This includes oil or grease spills. What can I do in the bathroom? Use night lights. Install grab bars by the toilet and in the tub and shower. Do not use towel bars as grab bars. Use non-skid mats or decals in the tub or shower. If you need to sit down in the shower, use a plastic, non-slip stool. Keep the floor dry. Clean up any water that spills on the floor as soon as it happens. Remove  soap buildup in the tub or shower regularly. Attach bath mats securely with double-sided non-slip rug tape. Do not have throw rugs and other things on the floor that can make you trip. What can I do in the bedroom? Use night lights. Make sure that you have a light by your bed that is easy to reach. Do not use any sheets or blankets that are too big for your bed. They should not hang down onto the floor. Have a firm chair that has side arms. You can use this for support while you get dressed. Do not have throw rugs and other things on the floor that can make you trip. What can I do in the kitchen? Clean up any spills right away. Avoid walking on wet floors. Keep items that you use a lot in easy-to-reach places. If you need to reach something above you, use a strong step stool that has a grab bar. Keep electrical cords out of the way. Do not use floor polish or wax that makes floors slippery. If you must use wax, use non-skid floor wax. Do not have throw rugs and other things on the floor that can make you trip. What can I do with my stairs? Do not leave any items on the stairs. Make sure that there are handrails on both sides of the stairs and use them. Fix handrails that are broken or loose. Make  sure that handrails are as long as the stairways. Check any carpeting to make sure that it is firmly attached to the stairs. Fix any carpet that is loose or worn. Avoid having throw rugs at the top or bottom of the stairs. If you do have throw rugs, attach them to the floor with carpet tape. Make sure that you have a light switch at the top of the stairs and the bottom of the stairs. If you do not have them, ask someone to add them for you. What else can I do to help prevent falls? Wear shoes that: Do not have high heels. Have rubber bottoms. Are comfortable and fit you well. Are closed at the toe. Do not wear sandals. If you use a stepladder: Make sure that it is fully opened. Do not climb a  closed stepladder. Make sure that both sides of the stepladder are locked into place. Ask someone to hold it for you, if possible. Clearly mark and make sure that you can see: Any grab bars or handrails. First and last steps. Where the edge of each step is. Use tools that help you move around (mobility aids) if they are needed. These include: Canes. Walkers. Scooters. Crutches. Turn on the lights when you go into a dark area. Replace any light bulbs as soon as they burn out. Set up your furniture so you have a clear path. Avoid moving your furniture around. If any of your floors are uneven, fix them. If there are any pets around you, be aware of where they are. Review your medicines with your doctor. Some medicines can make you feel dizzy. This can increase your chance of falling. Ask your doctor what other things that you can do to help prevent falls. This information is not intended to replace advice given to you by your health care provider. Make sure you discuss any questions you have with your health care provider. Document Released: 07/05/2009 Document Revised: 02/14/2016 Document Reviewed: 10/13/2014 Elsevier Interactive Patient Education  2017 [employer].

## 2021-09-25 ENCOUNTER — Encounter: Payer: Self-pay | Admitting: Physician Assistant

## 2021-09-27 DIAGNOSIS — Z809 Family history of malignant neoplasm, unspecified: Secondary | ICD-10-CM | POA: Diagnosis not present

## 2021-09-27 DIAGNOSIS — I4891 Unspecified atrial fibrillation: Secondary | ICD-10-CM | POA: Diagnosis not present

## 2021-09-27 DIAGNOSIS — K219 Gastro-esophageal reflux disease without esophagitis: Secondary | ICD-10-CM | POA: Diagnosis not present

## 2021-09-27 DIAGNOSIS — E785 Hyperlipidemia, unspecified: Secondary | ICD-10-CM | POA: Diagnosis not present

## 2021-09-27 DIAGNOSIS — G8929 Other chronic pain: Secondary | ICD-10-CM | POA: Diagnosis not present

## 2021-09-27 DIAGNOSIS — Z008 Encounter for other general examination: Secondary | ICD-10-CM | POA: Diagnosis not present

## 2021-09-27 DIAGNOSIS — N390 Urinary tract infection, site not specified: Secondary | ICD-10-CM | POA: Diagnosis not present

## 2021-09-27 DIAGNOSIS — D6869 Other thrombophilia: Secondary | ICD-10-CM | POA: Diagnosis not present

## 2021-09-27 DIAGNOSIS — R030 Elevated blood-pressure reading, without diagnosis of hypertension: Secondary | ICD-10-CM | POA: Diagnosis not present

## 2021-09-27 DIAGNOSIS — Z7901 Long term (current) use of anticoagulants: Secondary | ICD-10-CM | POA: Diagnosis not present

## 2021-09-27 DIAGNOSIS — M1990 Unspecified osteoarthritis, unspecified site: Secondary | ICD-10-CM | POA: Diagnosis not present

## 2021-09-27 DIAGNOSIS — R69 Illness, unspecified: Secondary | ICD-10-CM | POA: Diagnosis not present

## 2021-10-17 ENCOUNTER — Other Ambulatory Visit: Payer: Self-pay

## 2021-10-17 ENCOUNTER — Ambulatory Visit (INDEPENDENT_AMBULATORY_CARE_PROVIDER_SITE_OTHER): Payer: Medicare HMO | Admitting: Family Medicine

## 2021-10-17 ENCOUNTER — Encounter: Payer: Self-pay | Admitting: Family Medicine

## 2021-10-17 VITALS — BP 122/70 | HR 75 | Temp 97.0°F | Wt 139.0 lb

## 2021-10-17 DIAGNOSIS — I700 Atherosclerosis of aorta: Secondary | ICD-10-CM

## 2021-10-17 DIAGNOSIS — R519 Headache, unspecified: Secondary | ICD-10-CM | POA: Diagnosis not present

## 2021-10-17 DIAGNOSIS — R69 Illness, unspecified: Secondary | ICD-10-CM | POA: Diagnosis not present

## 2021-10-17 DIAGNOSIS — M25552 Pain in left hip: Secondary | ICD-10-CM

## 2021-10-17 DIAGNOSIS — F3341 Major depressive disorder, recurrent, in partial remission: Secondary | ICD-10-CM | POA: Diagnosis not present

## 2021-10-17 DIAGNOSIS — M25551 Pain in right hip: Secondary | ICD-10-CM

## 2021-10-17 DIAGNOSIS — I4891 Unspecified atrial fibrillation: Secondary | ICD-10-CM | POA: Diagnosis not present

## 2021-10-17 MED ORDER — GABAPENTIN 100 MG PO CAPS: 100.0000 mg | ORAL_CAPSULE | Freq: Every evening | ORAL | 5 refills | Status: DC | PRN

## 2021-10-17 MED ORDER — PREDNISONE 20 MG PO TABS: ORAL_TABLET | ORAL | 0 refills | Status: DC

## 2021-10-17 NOTE — Progress Notes (Signed)
Phone [phone] In person visit   Subjective:   Danielle Rocha is a 71 y.o. year old very pleasant female patient who presents for/with See problem oriented charting Chief Complaint  Patient presents with   Hip Pain    Bilateral- started 20 years ago originally . Went off medication for this 2 years ago and about 3 months ago has restarted   Headache    For several months, primarily in the back of head    This visit occurred during the SARS-CoV-2 public health emergency.  Safety protocols were in place, including screening questions prior to the visit, additional usage of staff PPE, and extensive cleaning of exam room while observing appropriate contact time as indicated for disinfecting solutions.   Past Medical History-  Patient Active Problem List   Diagnosis Date Noted   Dystonia     Priority: High   Major depression in full remission ([place]) 09/02/2016    Priority: High   History of SI/intentional drug overdose 11/22/2015    Priority: High   Atrial fibrillation ([place])     Priority: High   Chronic pain syndrome     Priority: High   Aortic atherosclerosis ([place]) 02/07/2020    Priority: Medium    Delayed gastric emptying 02/07/2020    Priority: Medium    Restless legs 07/27/2018    Priority: Medium    Recurrent UTI 10/07/2016    Priority: Medium    Hyperlipidemia     Priority: Medium    GERD (gastroesophageal reflux disease) 01/30/2016    Priority: Low   Sore in nose 07/30/2021   Involuntary movements 02/14/2020   Depression with anxiety 02/14/2020    Medications- reviewed and updated Current Outpatient Medications  Medication Sig Dispense Refill   acetaminophen (TYLENOL) 500 MG tablet Take 1,000 mg by mouth every 6 (six) hours as needed for mild pain, moderate pain, fever or headache.     buPROPion (WELLBUTRIN XL) 150 MG 24 hr tablet Take 450 mg by mouth every morning.     cephALEXin (KEFLEX) 250 MG capsule Take 250 mg by mouth daily. Takes daily for frequent  UTIs.     cholecalciferol (VITAMIN D) 1000 units tablet Take 1,000 Units by mouth daily.     co-enzyme Q-10 30 MG capsule Take 30 mg by mouth daily.     Cyanocobalamin (B-12 PO) Take by mouth.     FERROUS SULFATE PO Take by mouth.     flecainide (TAMBOCOR) 100 MG tablet Take 1 tablet (100 mg total) by mouth 2 (two) times daily. 180 tablet 3   gabapentin (NEURONTIN) 100 MG capsule Take 1-2 capsules (100-200 mg total) by mouth at bedtime as needed. 60 capsule 5   Melatonin 3 MG TABS Take 2 tablets by mouth at bedtime.      Multiple Vitamin (MULTIVITAMIN WITH MINERALS) TABS tablet Take 1 tablet by mouth daily.     mupirocin ointment (BACTROBAN) 2 % Place 1 application into the nose 2 (two) times daily. 22 g 0   ondansetron (ZOFRAN ODT) 4 MG disintegrating tablet Take 1 tablet (4 mg total) by mouth every 8 (eight) hours as needed for nausea or vomiting. 20 tablet 0   pantoprazole (PROTONIX) 20 MG tablet Take 1 tablet (20 mg total) by mouth 2 (two) times daily before a meal. Take 2 tablets daily. 180 tablet 3   rivaroxaban (XARELTO) 20 MG TABS tablet Take 1 tablet (20 mg total) by mouth daily with supper. 90 tablet 3   rosuvastatin (CRESTOR) 20  MG tablet Take 1 tablet (20 mg total) by mouth daily. 90 tablet 3   sertraline (ZOLOFT) 50 MG tablet Take 1 tablet (50 mg total) by mouth daily. 30 tablet 6   No current facility-administered medications for this visit.     Objective:  BP 122/70 (BP Location: Left Arm, Patient Position: Sitting)    Pulse 75    Temp (!) 97 F (36.1 C) (Temporal)    Wt 139 lb (63 kg)    LMP  (LMP Unknown)    SpO2 96%    BMI 21.77 kg/m  Gen: NAD, resting comfortably CV: RRR no murmurs rubs or gallops Lungs: CTAB no crackles, wheeze, rhonchi  Skin: warm, dry  Neuro: CN II-XII intact, sensation and reflexes normal throughout, 5/5 muscle strength in bilateral upper and lower extremities. Normal finger to nose. Normal rapid alternating movements. No pronator drift. Normal  romberg. Normal gait.  Does have some pain with standing- appears uncomfortable  MSK: Negative straight leg raise.  Pain with internal and external rotation and flexion bilaterally.  Pain with logroll.  Significant pain with palpation of the greater trochanteric bursa.     Assessment and Plan      #Severe headaches S:Severe headaches that wake her up and keep her up from sleep. These started about a month ago. No clear triggers around that time. Had some mild headaches for several months. Headaches over 4 hours. Diffuse headaches. Has a hard time sleeping from both issues. 8/10 at peak.   -Patient with history of MRI of the brain on 06/06/2021 emergency department but this was for dizziness with trouble walking, TIA concern, speech disturbance with the following results "egative examination. Mild age related volume loss. No old or acute focal insult. "  Prior to stopping gabapentin 300mg  at bedtime- had been up to 800 mg QID in past. Prior memory loss issues and unsteadiness before stopping though.   No facial or extremity weakness. No slurred words or trouble swallowing. no blurry vision (mild intermittent issues- encouraged to see eye doctor and seek care ASAP if worsens) or double vision. No paresthesias. No confusion or word finding difficulties.   #Bilateral hip pain- history of chronic pain S:Patient reports issues started about 20 years ago- had been more tolerable lately though.  He reports stopping medication about 2 years ago (prior Hydrocodone stopped but did not notice worsening pain at that time) and 3 months ago pain started to pick back up- of note did stop gabapentin around 07/15/21 . She was taking tylenol just a few times a week. Wakes her up in pain. Band from buttocks then lateral and into the front of the leg. Feels like walking on "dry sockets". 8/10 pain.   On disability due to this chronic pain syndrome predating being cared for at [facility]. Extensive prior orthopedic  workup. issues mainly in low back and hips. Some low back pain but not like prior.  -Pain worsened with PT in the past - did ok with injections in past but would come and go -thinks she tolerates prednisone  A/P: Recurrent hip pain bilateral after stopping gabapentin but with signficant side effects on higher gabapentin dose in past. Later developed headache but potentially from dealing with hip pain.   Trial gabapentin 100 mg for the next week. Reassuring neuro exam today.  I wonder if the ongoing hip pain which was better controlled on the gabapentin is so severe that it also could be triggering her headaches.  Perhaps restarting this since you  are doing pretty well in regards to hip pain may help.  If not doing better in a week can try 200 mg as long as there are no side effects.  If any side effects like previous please stop immediately and let us know. We are trying lower dose to see if that helps. We considered MRI brain but thought reasonable to try meds first and reassess- if symptoms worsen though lets change plan.  - Hip pain could be OA and bursitis- tender on both exams. Considering sports medicine or orthopedics visit for the hips but you wanted to try this first.   - short term trial of prednisone (history of bursitis and hurts over bursa but also has OA type pain with  - interested in celebrex but I worry about GI bleed risk on Xarelto - she is interested in seeing Dr. French Ana potentially- reports a favorable encounter in past.  -restless legs slightly worse- low dose restart gabapentin may help    #Atrial fibrillation S:Compliant with Xarelto for anticoagulation.  Flecainide as an antiarrhythmic.  Does not follow with cardiology regularly did have visit 06/13/21 afer steadiness issues A/P: appropriately anticoagulated/rate controlled on antiarrhythmic- continue current meds    #Major depressive disorder  working with psychiatry Dr. Casimiro Needle.  S: currently on sertraline 50mg  and  wellbutrin 150 XL 2 in the AM as well.  -Previously On Wellbutrin 300mg , gabepentin, trazodone 300 before bed - actually feels better with reduction in medicine - feels more mentally clear after prior ED trip back in September of last year.  Depression screen [facility] 2/9 10/17/2021 09/09/2021 06/06/2021  Decreased Interest 1 0 1  Down, Depressed, Hopeless 1 0 1  PHQ - 2 Score 2 0 2  Altered sleeping 3 - 0  Tired, decreased energy 3 - 3  Change in appetite 0 - 1  Feeling bad or failure about yourself  0 - 3  Trouble concentrating 3 - 2  Moving slowly or fidgety/restless 1 - 2  Suicidal thoughts 0 - 2  PHQ-9 Score 12 - 15  Difficult doing work/chores Somewhat difficult - Very difficult  Some recent data might be hidden  A/P:  improving despite decrease in meds and recent issues with headaches and pain- overall encouraging. She will continue current meds and follow up with Dr. Casimiro Needle. No thoughts of self harm!    #Hyperlipidemia/aortic atherosclerosis- incidental finding on 11/04/19 CT S:compliant rosuvastatin 20mg . LDL goal at least under 100 prefer under 70. She wants to work on lifestyle as much as able -also has improved food intake plus prior stomach upset when having prior neuro issues Lab Results  Component Value Date   CHOL 197 06/06/2021   HDL 77.20 06/06/2021   LDLCALC 96 06/06/2021   LDLDIRECT 104.0 11/01/2019   TRIG 118.0 06/06/2021   CHOLHDL 3 06/06/2021   A/P: ideal LDL under 70 with aortic atherosclerosis- we will plan in future to update direct LDL to reassess on rosuvastatin. For now continue current meds   Recommended follow [id number] in about 1 month (around 11/17/2021). Future Appointments  Date Time Provider [place]  06/09/2022  9:40 AM Marin Olp, MD [facility]  09/29/2022  3:15 PM [facility] HEALTH COACH [facility]    Lab/Order associations:   ICD-10-CM   1. Recurrent major depressive disorder, in partial remission (HCC) Chronic F33.41     2.  Aortic atherosclerosis (HCC) Chronic I70.0     3. Atrial fibrillation, unspecified type (HCC) Chronic I48.91  Meds ordered this encounter  Medications   gabapentin (NEURONTIN) 100 MG capsule    Sig: Take 1-2 capsules (100-200 mg total) by mouth at bedtime as needed.    Dispense:  60 capsule    Refill:  5    Return precautions advised.  Garret Reddish, MD

## 2021-10-17 NOTE — Patient Instructions (Addendum)
Trial gabapentin 100 mg for the next week.  I wonder if the ongoing hip pain which was better controlled on the gabapentin is so severe that it also could be triggering her headaches.  Perhaps restarting this since you are doing pretty well in regards to hip pain may help.  If not doing better in a week can try 200 mg as long as there are no side effects.  If any side effects like previous please stop immediately and let us know. We are trying lower dose to see if that helps. We considered MRI brain but thought reasonable to try meds first and reassess- if symptoms worsen though lets change plan. Considering sports medicine or orthopedics visit for the hips but you wanted to try this first.  -trial prednisone   Recommended follow up: Return in about 1 month (around 11/17/2021). For front desk- can use same day slot

## 2021-11-12 ENCOUNTER — Telehealth: Payer: Self-pay | Admitting: Family Medicine

## 2021-11-12 NOTE — Chronic Care Management (AMB) (Signed)
°  Chronic Care Management   Note  11/12/2021 Name: Jazara Swiney MRN: [id number] DOB: 1951/01/06  Arista Kettlewell is a 71 y.o. year old female who is a primary care patient of Durene Cal, Aldine Contes, MD. I reached out to Rosine Door by phone today in response to a referral sent by Ms. Robinette Haines PCP, Shelva Majestic, MD.   Ms. Cassara was given information about Chronic Care Management services today including:  CCM service includes personalized support from designated clinical staff supervised by her physician, including individualized plan of care and coordination with other care providers 24/7 contact phone numbers for assistance for urgent and routine care needs. Service will only be billed when office clinical staff spend 20 minutes or more in a month to coordinate care. Only one practitioner may furnish and bill the service in a calendar month. The patient may stop CCM services at any time (effective at the end of the month) by phone call to the office staff.   Patient agreed to services and verbal consent obtained.   Follow up plan:   [employer], [employer]

## 2021-11-13 ENCOUNTER — Telehealth: Payer: Self-pay

## 2021-11-13 DIAGNOSIS — M25552 Pain in left hip: Secondary | ICD-10-CM

## 2021-11-13 DIAGNOSIS — M25551 Pain in right hip: Secondary | ICD-10-CM

## 2021-11-13 NOTE — Telephone Encounter (Signed)
Patient states she seen Dr. Yong Channel for hip pain and was told to call back for a referral if not any better.  Patient states she is not better and would like a referral to a specialist.    States son went to [facility] and would like to know if there is a specialist at [facility] in hips?

## 2021-11-13 NOTE — Telephone Encounter (Signed)
If she likes [facility]- can use [facility]- they have a doctor in that location I believe

## 2021-11-13 NOTE — Telephone Encounter (Signed)
Referral to [facility] has been placed.

## 2021-11-13 NOTE — Addendum Note (Signed)
Addended by: Shelva Majestic on: 11/13/2021 02:13 PM   Modules accepted: Orders

## 2021-11-13 NOTE — Telephone Encounter (Signed)
Would this be considered ortho or sports meds referral?

## 2021-11-15 ENCOUNTER — Other Ambulatory Visit (HOSPITAL_BASED_OUTPATIENT_CLINIC_OR_DEPARTMENT_OTHER): Payer: Self-pay | Admitting: Orthopaedic Surgery

## 2021-11-15 ENCOUNTER — Ambulatory Visit (HOSPITAL_BASED_OUTPATIENT_CLINIC_OR_DEPARTMENT_OTHER): Payer: Medicare HMO | Admitting: Orthopaedic Surgery

## 2021-11-15 DIAGNOSIS — M25559 Pain in unspecified hip: Secondary | ICD-10-CM

## 2021-11-19 ENCOUNTER — Encounter: Payer: Medicare HMO | Admitting: Psychology

## 2021-11-20 ENCOUNTER — Other Ambulatory Visit (HOSPITAL_BASED_OUTPATIENT_CLINIC_OR_DEPARTMENT_OTHER): Payer: Self-pay | Admitting: Orthopaedic Surgery

## 2021-11-20 ENCOUNTER — Other Ambulatory Visit: Payer: Self-pay

## 2021-11-20 ENCOUNTER — Ambulatory Visit (HOSPITAL_BASED_OUTPATIENT_CLINIC_OR_DEPARTMENT_OTHER)
Admission: RE | Admit: 2021-11-20 | Discharge: 2021-11-20 | Disposition: A | Discharge status: Home / Self Care | Payer: Medicare HMO | Source: Ambulatory Visit | Attending: Orthopaedic Surgery | Admitting: Orthopaedic Surgery

## 2021-11-20 ENCOUNTER — Ambulatory Visit: Payer: Medicare HMO | Admitting: Family Medicine

## 2021-11-20 ENCOUNTER — Ambulatory Visit (HOSPITAL_BASED_OUTPATIENT_CLINIC_OR_DEPARTMENT_OTHER): Payer: Medicare HMO | Admitting: Orthopaedic Surgery

## 2021-11-20 DIAGNOSIS — M25559 Pain in unspecified hip: Secondary | ICD-10-CM | POA: Insufficient documentation

## 2021-11-20 DIAGNOSIS — M67959 Unspecified disorder of synovium and tendon, unspecified thigh: Secondary | ICD-10-CM | POA: Diagnosis not present

## 2021-11-20 DIAGNOSIS — M25552 Pain in left hip: Secondary | ICD-10-CM | POA: Diagnosis not present

## 2021-11-20 NOTE — Progress Notes (Signed)
Chief Complaint: Bilateral hip pain     History of Present Illness:    Danielle Rocha is a 71 y.o. female presents today with right worse than left hip pain.  This has been going on since 1998.  She has an extremely extensive history of treatment on both of these hips.  She states that the right has been the focus of surgical treatment.  Specifically she did have a right piriformis release done in 1999 in [place] as well as an L4-L5 fusion which was done at [facility] around this time.  She states that she got no relief from either of these procedures.  She continues to experience pain about the lateral aspect of the hips.  She has had multiple injections into the SI and into the hips bilaterally.  These were all done many years prior.  She has been working on physical therapy for multiple years and most recently began working on the hips a year prior.  She is not getting any type of long-term relief.  She is the mother of Danielle Rocha who I have been treating for his knee.    Surgical History:   As above  PMH/PSH/Family History/Social History/Meds/Allergies:    Past Medical History:  Diagnosis Date   Arthritis    DJD, low back, thumb   Atrial fibrillation (HCC)    Xarelto anticoagulation. Flecainide antiarrythmic    Chicken pox    Chronic pain syndrome    On disability. History of bilateral hip pain, low back pain. Gabapentin 400 BID, percocet 1 tablet daily per prior provider.    Colon polyp    awaiting records   Depression    zoloft 100mg , remeron 30mg  per psychiatry. ambien 10mg  per psychiatry to help wtih sleep element.    Dystonia    described as psychogenic dystonia. Pain and twisting from upper chest and up with triggers "wind, creamy food" on TID ativan per psychiatry previously.    Fibromyalgia    GERD (gastroesophageal reflux disease)    omeprazole OTC   Goiter    states multiple imaging tests, has had biopsies   Hyperlipidemia     lovastatin 20mg    Stroke (HCC)    TIA (left side of face and body decreased sensitivity than right face and side)   TIA (transient ischemic attack)    Vaginal atrophy    estrace vaginal cream   Past Surgical History:  Procedure Laterality Date   ATRIAL FIBRILLATION ABLATION     BIOPSY THYROID     fusion l4-l5  09/22/1998   LAMINECTOMY     L4-L5   OTHER SURGICAL HISTORY     tennis elbow surgery   piriformis release  09/22/1997   right hip   TONSILLECTOMY AND ADENOIDECTOMY  age 78   VAGINAL HYSTERECTOMY  09/23/1991   Social History   Socioeconomic History   Marital status: Married    Spouse name: Not on file   Number of children: 3   Years of education: 13   Highest education level: Not on file  Occupational History   Occupation: retired  Tobacco Use   Smoking status: Never   Smokeless tobacco: Never  Vaping Use   Vaping Use: Never used  Substance and Sexual Activity   Alcohol use: No    Alcohol/week: 0.0 standard drinks   Drug use:  No   Sexual activity: Not Currently    Partners: Male  Other Topics Concern   Not on file  Social History Narrative   Family: Married. Husband works as Geophysicist/field seismologist for [employer]. 2 children from previous marriage 1 from current. Son Danielle Rocha lives with them and has down's syndrome.       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 Moved to [facility] from [id number] and became disabled in that time frame due to hip/back issues.       Hobbies: time with son   Right handed   One story home   Caffeine occasionally   Social Determinants of Health   Financial Resource Strain: Low Risk    Difficulty of Paying Living Expenses: Not hard at all  Food Insecurity: No Food Insecurity   Worried About Charity fundraiser in the Last Year: Never true   Arboriculturist in the Last Year: Never true  Transportation Needs: No Transportation Needs   Lack of Transportation (Medical): No   Lack of Transportation (Non-Medical): No   Physical Activity: Insufficiently Active   Days of Exercise per Week: 3 days   Minutes of Exercise per Session: 30 min  Stress: No Stress Concern Present   Feeling of Stress : Not at all  Social Connections: Unknown   Frequency of Communication with Friends and Family: More than three times a week   Frequency of Social Gatherings with Friends and Family: More than three times a week   Attends Religious Services: More than 4 times per year   Active Member of [employer] or Organizations: No   Attends Music therapist: Never   Marital Status: Not on file   Family History  Problem Relation Age of Onset   Alcohol abuse Mother        possible she had stomach cancer as well but unsure   Hypertension Father    Alcohol abuse Father    Pancreatic cancer Father    Cancer Brother        spindle cell RLE   Diabetes Brother    Cancer Brother        tonsil cancer   Skin cancer Brother    Colon cancer Neg Hx    Colon polyps Neg Hx    Stomach cancer Neg Hx    Allergies  Allergen Reactions   Reglan [Metoclopramide] Other (See Comments)    Suicidal thoughts   Ciprofloxacin Diarrhea   Clindamycin/Lincomycin Diarrhea   Doxycycline Diarrhea and Nausea And Vomiting   Macrobid [Nitrofurantoin Monohyd Macro] Rash    Rash on lip   Sulfa Antibiotics Rash   Current Outpatient Medications  Medication Sig Dispense Refill   acetaminophen (TYLENOL) 500 MG tablet Take 1,000 mg by mouth every 6 (six) hours as needed for mild pain, moderate pain, fever or headache.     buPROPion (WELLBUTRIN XL) 150 MG 24 hr tablet Take 450 mg by mouth every morning.     cephALEXin (KEFLEX) 250 MG capsule Take 250 mg by mouth daily. Takes daily for frequent UTIs.     cholecalciferol (VITAMIN D) 1000 units tablet Take 1,000 Units by mouth daily.     co-enzyme Q-10 30 MG capsule Take 30 mg by mouth daily.     Cyanocobalamin (B-12 PO) Take by mouth.     FERROUS SULFATE PO Take by mouth.     flecainide (TAMBOCOR)  100 MG tablet Take 1 tablet (100 mg total) by mouth 2 (two) times daily.  180 tablet 3   gabapentin (NEURONTIN) 100 MG capsule Take 1-2 capsules (100-200 mg total) by mouth at bedtime as needed. 60 capsule 5   Melatonin 3 MG TABS Take 2 tablets by mouth at bedtime.      Multiple Vitamin (MULTIVITAMIN WITH MINERALS) TABS tablet Take 1 tablet by mouth daily.     mupirocin ointment (BACTROBAN) 2 % Place 1 application into the nose 2 (two) times daily. 22 g 0   ondansetron (ZOFRAN ODT) 4 MG disintegrating tablet Take 1 tablet (4 mg total) by mouth every 8 (eight) hours as needed for nausea or vomiting. 20 tablet 0   pantoprazole (PROTONIX) 20 MG tablet Take 1 tablet (20 mg total) by mouth 2 (two) times daily before a meal. Take 2 tablets daily. 180 tablet 3   predniSONE (DELTASONE) 20 MG tablet Take 2 pills for 3 days, 1 pill for 4 days 10 tablet 0   rivaroxaban (XARELTO) 20 MG TABS tablet Take 1 tablet (20 mg total) by mouth daily with supper. 90 tablet 3   rosuvastatin (CRESTOR) 20 MG tablet Take 1 tablet (20 mg total) by mouth daily. 90 tablet 3   sertraline (ZOLOFT) 50 MG tablet Take 1 tablet (50 mg total) by mouth daily. 30 tablet 6   No current facility-administered medications for this visit.   No results found.  Review of Systems:   A ROS was performed including pertinent positives and negatives as documented in the HPI.  Physical Exam :   Constitutional: NAD and appears stated age Neurological: Alert and oriented Psych: Appropriate affect and cooperative There were no vitals taken for this visit.   Comprehensive Musculoskeletal Exam:    Inspection Right Left  Skin No atrophy or gross abnormalities appreciated No atrophy or gross abnormalities appreciated  Palpation    Tenderness None None  Crepitus None None  Range of Motion    Flexion (passive) 120 120  Extension 30 30  IR 30 no pain 30 no pain  ER 45 with trochanteric pain 45 with trochanteric pain  Strength    Flexion   5/5 5/5  Extension 5/5 5/5  Special Tests    FABIR Negative Negative  FADER Negative Negative  ER Lag/Capsular Insufficiency Negative Negative  Instability Negative Negative  Sacroiliac pain Negative  Negative   Instability    Generalized Laxity No No  Neurologic    sciatic, femoral, obturator nerves intact to light sensation  Vascular/Lymphatic    DP pulse 2+ 2+  Lumbar Exam    Patient has symmetric lumbar range of motion with negative pain referral to hip   Weakness with resisted abduction in the lateral position on both sides.  She is got Trendelenburg with ambulation.  She has a wobbly gait with single standing leg test.  Imaging:   Xray (AP pelvis, 2 views right hip, 2 views left hip): Very mild osteoarthritis involving both hips    I personally reviewed and interpreted the radiographs.   Assessment:   71 year old female with bilateral hip pain consistent with chronic gluteus medius tendinitis versus tendinopathy versus tearing.  I did discuss that given the fact that she has trialed multiple injections in the past that his previous surgery including physical therapy for hip strengthening without relief, I do believe that MRIs are indicated in order to assess if she has any type of gluteus tearing.  In particular I am concerned about her Trendelenburg and weakness with single standing leg test.  I have advised that this can oftentimes  be related to tearing of the gluteus medius.  At this time we will plan for an MRI of both hips and follow-up to discuss results.  I would also like to perform ultrasound-guided gluteus medius injections today in order to help get her some pain relief.  Plan :    -Plan for MRI bilateral hips to rule out gluteus medius tearing -Bilateral ultrasound-guided hip injections performed the gluteus medius today after verbal consent was obtained   I believe that advance imaging in the form of an MRI is indicated for the following reasons: -Xrays images  were obtained and not diagnostic -The patient has failed treatment modalities including rest, activity restriction, anti-inflammatories, hydrocodone, physical therapy, previous surgery, injections 5 years prior in both hips bilaterally -The following worrisome symptoms are present on history and exam: Weakness with gait as well as weakness of a single standing leg test       I personally saw and evaluated the patient, and participated in the management and treatment plan.  Vanetta Mulders, MD Attending Physician, Orthopedic Surgery  This document was dictated using Dragon voice recognition software. A reasonable attempt at proof reading has been made to minimize errors.

## 2021-11-26 ENCOUNTER — Telehealth: Payer: Self-pay | Admitting: Pharmacist

## 2021-11-26 NOTE — Progress Notes (Signed)
Chronic Care Management Pharmacy Note  11/29/2021 Name:  Danielle Rocha MRN:  [id number] DOB:  03/20/1951  Summary: Initial visit PharmD.  She mentions hip pain has improved with injections.  She does mention sleep disturbance waking in the middle of the night with difficulty falling back asleep.  Also mentions she is paying OOP for some of her wellbutrin due to the way the prescription is written.  Recommendations/Changes made from today's visit: DEXA scan for long term PPI use Consult with Plovsky for new rx for Wellbutrin XL 369m tabs Consider switch zoloft to morning to see if this helps with sleep disturbance  Plan: FU 1 year   Subjective: Danielle Cartonis an 71y.o. year old female who is a primary patient of Hunter, SBrayton Mars MD.  The CCM team was consulted for assistance with disease management and care coordination needs.    Engaged with patient by telephone for initial visit in response to provider referral for pharmacy case management and/or care coordination services.   Consent to Services:  The patient was given the following information about Chronic Care Management services today, agreed to services, and gave verbal consent: 1. CCM service includes personalized support from designated clinical staff supervised by the primary care provider, including individualized plan of care and coordination with other care providers 2. 24/7 contact phone numbers for assistance for urgent and routine care needs. 3. Service will only be billed when office clinical staff spend 20 minutes or more in a month to coordinate care. 4. Only one practitioner may furnish and bill the service in a calendar month. 5.The patient may stop CCM services at any time (effective at the end of the month) by phone call to the office staff. 6. The patient will be responsible for cost sharing (co-pay) of up to 20% of the service fee (after annual deductible is met). Patient agreed to services and consent  obtained.  Patient Care Team: HMarin Olp MD as PCP - General (Family Medicine) RFay Records MD as PCP - Cardiology (Cardiology) MBjorn Loser MD as Consulting Physician (Urology) RFay Records MD as Consulting Physician (Cardiology) PNorma Fredrickson MD as Consulting Physician (Psychiatry) DEdythe Clarity RMain Street Asc LLCas Pharmacist (Pharmacist)  Recent office visits:  10/17/2021 OV (PCP) HMarin Olp MD; Trial gabapentin 100 mg for the next week,  If not doing better in a week can try 200 mg as long as there are no side effects.  If any side effects like previous please stop immediately and let uKoreaknow. We are trying lower dose to see if that helps, - interested in celebrex but I worry about GI bleed risk on Xarelto   07/30/2021 OV (Family Medicine) HJeanie Sewer NP; Mupirocin ointment sent to pharmacy, if not improving within a week, pt instructed to call back and will refer to ENT.   06/11/2021 OV (PCP) HMarin Olp MD; Encourage patient to transition to rosuvastatin-rosuvastatin crossing blood-brain barrier less than atorvastatin hopeful this will be helpful in regards to concerns about memory   Recent consult visits:  11/20/2021 OV (Orthopedics) BVanetta Mulders MD; no medication changes indicated.   06/14/2021 OV (Neurology) WRondel Jumbo PA-C; no medication changes indicated.   06/13/2021 OV (Cardiology) RFay Records MD; no medication changes indicated.   Hospital visits:  06/06/2021 ED visit due to Dizziness at [facility]-No medication changes indicated.     Objective:  Lab Results  Component Value Date   CREATININE 0.80 06/06/2021  BUN 13 06/06/2021   GFR 71.05 06/06/2021   GFRNONAA >60 06/06/2021   GFRAA >60 09/03/2016   NA 138 06/06/2021   K 5.4 (H) 06/06/2021   CALCIUM 9.2 06/06/2021   CO2 28 06/06/2021   GLUCOSE 92 06/06/2021    Lab Results  Component Value Date/Time   GFR 71.05 06/06/2021 10:07 AM    GFR 56.52 (L) 11/01/2019 09:16 AM    Last diabetic Eye exam: No results found for: HMDIABEYEEXA  Last diabetic Foot exam: No results found for: HMDIABFOOTEX   Lab Results  Component Value Date   CHOL 197 06/06/2021   HDL 77.20 06/06/2021   LDLCALC 96 06/06/2021   LDLDIRECT 104.0 11/01/2019   TRIG 118.0 06/06/2021   CHOLHDL 3 06/06/2021    Hepatic Function Latest Ref Rng & Units 06/06/2021 06/06/2021 03/20/2021  Total Protein 6.5 - 8.1 g/dL 6.8 6.7 6.5  Albumin 3.5 - 5.0 g/dL 4.1 4.1 4.0  AST 15 - 41 U/L _0 ALT 0 - 44 U/L _1 Alk Phosphatase 38 - 126 U/L 62 64 47  Total Bilirubin 0.3 - 1.2 mg/dL 0.6 0.5 0.6    Lab Results  Component Value Date/Time   TSH 0.73 06/06/2021 10:07 AM   TSH 0.54 11/01/2019 09:16 AM    CBC Latest Ref Rng & Units 06/06/2021 06/06/2021 06/06/2021  WBC 4.0 - 10.5 K/uL - 9.4 6.9  Hemoglobin 12.0 - 15.0 g/dL 12.6 13.5 14.1  Hematocrit 36.0 - 46.0 % 37.0 38.9 42.2  Platelets 150 - 400 K/uL - 218 229.0    No results found for: VD25OH  Clinical ASCVD: Yes  The 10-year ASCVD risk score (Arnett DK, et al., 2019) is: 7.2%   Values used to calculate the score:     Age: 50 years     Sex: Female     Is Non-Hispanic African American: No     Diabetic: No     Tobacco smoker: No     Systolic Blood Pressure: 579 mmHg     Is BP treated: No     HDL Cholesterol: 77.2 mg/dL     Total Cholesterol: 197 mg/dL    Depression screen [facility] 2/9 10/17/2021 09/09/2021 06/06/2021  Decreased Interest 1 0 1  Down, Depressed, Hopeless 1 0 1  PHQ - 2 Score 2 0 2  Altered sleeping 3 - 0  Tired, decreased energy 3 - 3  Change in appetite 0 - 1  Feeling bad or failure about yourself  0 - 3  Trouble concentrating 3 - 2  Moving slowly or fidgety/restless 1 - 2  Suicidal thoughts 0 - 2  PHQ-9 Score 12 - 15  Difficult doing work/chores Somewhat difficult - Very difficult  Some recent data might be hidden     Social History   Tobacco Use  Smoking Status Never   Smokeless Tobacco Never   BP Readings from Last 3 Encounters:  10/17/21 122/70  07/30/21 112/64  06/14/21 134/76   Pulse Readings from Last 3 Encounters:  10/17/21 75  07/30/21 61  06/14/21 76   Wt Readings from Last 3 Encounters:  10/17/21 139 lb (63 kg)  07/30/21 147 lb 6.4 oz (66.9 kg)  06/14/21 149 lb (67.6 kg)   BMI Readings from Last 3 Encounters:  10/17/21 21.77 kg/m  07/30/21 23.09 kg/m  06/14/21 23.34 kg/m    Assessment/Interventions: Review of patient past medical history, allergies, medications, health status, including review of consultants reports, laboratory and other test data,  was performed as part of comprehensive evaluation and provision of chronic care management services.   SDOH:  (Social Determinants of Health) assessments and interventions performed: Yes  Financial Resource Strain: Low Risk    Difficulty of Paying Living Expenses: Not hard at all    Food Insecurity: No Food Insecurity   Worried About Charity fundraiser in the Last Year: Never true   Arboriculturist in the Last Year: Never true    SDOH Screenings   Alcohol Screen: Not on file  Depression (PHQ2-9): Medium Risk   PHQ-2 Score: 12  Financial Resource Strain: Low Risk    Difficulty of Paying Living Expenses: Not hard at all  Food Insecurity: No Food Insecurity   Worried About Charity fundraiser in the Last Year: Never true   Ran Out of Food in the Last Year: Never true  Housing: [place] Score: 0  Physical Activity: Insufficiently Active   Days of Exercise per Week: 3 days   Minutes of Exercise per Session: 30 min  Social Connections: Unknown   Frequency of Communication with Friends and Family: More than three times a week   Frequency of Social Gatherings with Friends and Family: More than three times a week   Attends Religious Services: More than 4 times per year   Active Member of [employer] or Organizations: No   Attends Music therapist:  Never   Marital Status: Not on file  Stress: No Stress Concern Present   Feeling of Stress : Not at all  Tobacco Use: Low Risk    Smoking Tobacco Use: Never   Smokeless Tobacco Use: Never   Passive Exposure: Not on file  Transportation Needs: No Transportation Needs   Lack of Transportation (Medical): No   Lack of Transportation (Non-Medical): No    CCM Care Plan  Allergies  Allergen Reactions   Reglan [Metoclopramide] Other (See Comments)    Suicidal thoughts   Ciprofloxacin Diarrhea   Clindamycin/Lincomycin Diarrhea   Doxycycline Diarrhea and Nausea And Vomiting   Macrobid [Nitrofurantoin Monohyd Macro] Rash    Rash on lip   Sulfa Antibiotics Rash    Medications Reviewed Today     Reviewed by Edythe Clarity, [facility] (Pharmacist) on 11/29/21 at 35  Med List Status: <None>   Medication Order Taking? Sig Documenting Provider Last Dose Status Informant  acetaminophen (TYLENOL) 500 MG tablet [id number] Yes Take 1,000 mg by mouth every 6 (six) hours as needed for mild pain, moderate pain, fever or headache. [provider] Taking Active Self  buPROPion (WELLBUTRIN XL) 150 MG 24 hr tablet [id number] Yes Take 450 mg by mouth every morning. [provider] Taking Active            Med Note Edison Pace, KRISTEN A   Thu Oct 17, 2021  8:07 AM) Taking 300 mg total  cephALEXin (KEFLEX) 250 MG capsule [id number] Yes Take 250 mg by mouth daily. Takes daily for frequent UTIs. [provider] Taking Active   cholecalciferol (VITAMIN D) 1000 units tablet [id number] Yes Take 1,000 Units by mouth daily. [provider] Taking Active Self  co-enzyme Q-10 30 MG capsule [id number] Yes Take 30 mg by mouth daily. [provider] Taking Active Self  Cyanocobalamin (B-12 PO) [id number] Yes Take by mouth. [provider] Taking Active Self  FERROUS SULFATE PO [id number] No Take by mouth.  Patient not taking: Reported on 11/29/2021   [provider]  Not Taking Active   flecainide (TAMBOCOR) 100 MG tablet [id number] Yes Take 1 tablet (100 mg total) by mouth 2 (two) times daily. Marin Olp, MD Taking Active   gabapentin (NEURONTIN) 100 MG capsule [id number] Yes Take 1-2 capsules (100-200 mg total) by mouth at bedtime as needed. Marin Olp, MD Taking Active   Melatonin 3 MG TABS [id number] Yes Take 2 tablets by mouth at bedtime.  [provider] Taking Active Self  Multiple Vitamin (MULTIVITAMIN WITH MINERALS) TABS tablet [id number] Yes Take 1 tablet by mouth daily. [provider] Taking Active Self  ondansetron (ZOFRAN ODT) 4 MG disintegrating tablet [id number] Yes Take 1 tablet (4 mg total) by mouth every 8 (eight) hours as needed for nausea or vomiting. Gareth Morgan, MD Taking Active   pantoprazole (PROTONIX) 20 MG tablet [id number] Yes Take 1 tablet (20 mg total) by mouth 2 (two) times daily before a meal. Take 2 tablets daily. Esterwood, Amy S, PA-C Taking Active   rivaroxaban (XARELTO) 20 MG TABS tablet [id number] Yes Take 1 tablet (20 mg total) by mouth daily with supper. Marin Olp, MD Taking Active   rosuvastatin (CRESTOR) 20 MG tablet [id number] Yes Take 1 tablet (20 mg total) by mouth daily. Marin Olp, MD Taking Active   sertraline (ZOLOFT) 50 MG tablet [id number] Yes Take 1 tablet (50 mg total) by mouth daily. Marcial Pacas, MD Taking Active Self           Med Note Rosana Hoes, Coreena Rubalcava L   Fri Nov 29, 2021  9:42 AM) 24m twice daily            Patient Active Problem List   Diagnosis Date Noted   Sore in nose 07/30/2021   Involuntary movements 02/14/2020   Depression with anxiety 02/14/2020   Aortic atherosclerosis ([place] 02/07/2020   Delayed gastric emptying 02/07/2020   Dystonia    Restless legs 07/27/2018   Recurrent UTI 10/07/2016   Major depression in full remission ([facility] 09/02/2016   GERD (gastroesophageal reflux disease) 01/30/2016   History of SI/intentional drug overdose  11/22/2015   Atrial fibrillation (HCC)    Chronic pain syndrome    Hyperlipidemia     Immunization History  Administered Date(s) Administered   Fluad Quad(high Dose 65+) 07/24/2020, 06/06/2021   Influenza, High Dose Seasonal PF 07/29/2017, 05/27/2019   Influenza-Unspecified 06/09/2013, 06/06/2014, 07/04/2015, 07/26/2016, 07/26/2018   PFIZER(Purple Top)SARS-COV-2 Vaccination 11/13/2019, 12/07/2019   Pneumococcal Conjugate-13 04/27/2017   Pneumococcal Polysaccharide-23 09/23/2012, 10/28/2018   Td 09/23/2012   Tdap 06/13/2012   Zoster Recombinat (Shingrix) 09/21/2021   Zoster, Live 08/16/2012    Conditions to be addressed/monitored:  Afib, GERD, HLD, Depression, RLS  Care Plan : General Pharmacy (Adult)  Updates made by DEdythe Clarity RPH since 11/29/2021 12:00 AM     Problem: Afib, GERD, HLD, Depression,   Priority: High  Onset Date: 11/29/2021     Long-Range Goal: Patient-Specific Goal   Start Date: 11/29/2021  Expected End Date: 06/01/2022  This Visit's Progress: On track  Priority: High  Note:   Current Barriers:  LDL above goal Hip pain Sleep disturbances  Pharmacist Clinical Goal(s):  Patient will achieve improvement in LDL as evidenced by labs through collaboration with PharmD and provider.   Interventions: 1:1 collaboration with HMarin Olp MD regarding development and update of comprehensive plan of care as evidenced by provider attestation and co-signature Inter-disciplinary care team collaboration (see longitudinal plan of care) Comprehensive medication review performed; medication list updated in electronic  medical record  Hyperlipidemia: (LDL goal < 70) -Uncontrolled -Current treatment: Rosuvastatin 70m Appropriate, Query effective -Medications previously tried: atorvastatin  -Current exercise habits: walks at least 4-5 days per week -Educated on Cholesterol goals;  Benefits of statin for ASCVD risk reduction; Importance of limiting foods  high in cholesterol; -Not at goal < 70 last September - switched to Crestor 240mand has not had lipids checked since.  She has physical with Dr. HuYong Channeln September this year and will recheck lipids. She has been able to increase physical activity recently because she is not in as much hip pain as she was previously.  No changes at this time - if lipids remain elevated could increase statin to Rosuvastatin 4070mn September.  Atrial Fibrillation (Goal: prevent stroke and major bleeding) -Controlled -Current treatment: Rate control: Flecainide 100m86mice daily Appropriate, Effective, Safe, Accessible Anticoagulation: Xarelto 20mg49mropriate, Effective, Safe, Accessible -Medications previously tried: none noted -Home BP and HR readings: 100/60  -Counseled on increased risk of stroke due to Afib and benefits of anticoagulation for stroke prevention; bleeding risk associated with Xarelto and importance of self-monitoring for signs/symptoms of bleeding; -Recommended to continue current medication Assessed patient finances. She does not have any issues with Xarelto copay.  Depression/Anxiety (Goal: Minimize symptoms) -Controlled -Current treatment: Sertraline 50mg 67me daily Appropriate, Effective, Safe, Accessible Bupropion XL 300mg A33mpriate, Effective, Safe, Query accessible -Medications previously tried/failed: olanzapine, trazodone, lorazepam -PHQ9:  PHQ9 SCORE ONLY 10/17/2021 09/09/2021 06/06/2021  PHQ-9 Total Score 12 0 15   -GAD7: No flowsheet data found.  -Educated on Benefits of medication for symptom control -Patient reports stable mood, meds are managed by Dr. PlovskyCasimiro Needleatrist.  She takes Wellbutrin in the morning and Zoloft as 50mg tw54mdaily.  She mentions having a hard time with waking in the middle of the night and not being able to get back to sleep.  We discussed changing the timing of her zoloft from twice daily to taking just 100mg in 20mmorning to see if this  helps with sleep disturbances.  Patient wants to discuss with Dr. Plovsky aCasimiro Needleing appointment.   GERD (Goal: Minimize symptoms) -Controlled -Current treatment  Pantoprazole 20mg twic47mily Query Appropriate, -Medications previously tried: none noted - She takes twice daily and has not had any issues since starting this med.  Working on trigger foods - especially fried/spicy  -Recommended to continue current medication Could consider step down in the future.  Recommend DEXA with PPI use.  Patient Goals/Self-Care Activities Patient will:  - take medications as prescribed as evidenced by patient report and record review focus on medication adherence by pill box target a minimum of 150 minutes of moderate intensity exercise weekly  Follow Up Plan: The care management team will reach out to the patient again over the next 365 days.        Medication Assistance: None required.  Patient affirms current coverage meets needs.  Compliance/Adherence/Medication fill history: Care Gaps: DEXA  Star-Rating Drugs: Rosuvastatin 20 mg last filled 09/05/2021 90 DS  [facility] [address] [phone] box? Yes Pt endorses 100% compliance  We discussed: Benefits of medication synchronization, packaging and delivery as well as enhanced pharmacist oversight with [employer]. Patient decided to: Continue current medication management strategy  Care Plan and Follow Up Patient Decision:  Patient agrees to Care Plan and Follow-up.  Plan:  The care management team will reach out to the patient again over the next 365 days.  Beverly Milch, PharmD Clinical Pharmacist  [facility] [phone]

## 2021-11-26 NOTE — Progress Notes (Signed)
? ? ?Chronic Care Management ?Pharmacy Assistant  ? ?Name: Danielle Rocha  MRN: [id number] DOB: [birth year] ? ? ?Reason for Encounter: Chart Review For Initial Visit With Clinical Pharmacist ?  ?Conditions to be addressed/monitored: ?Atrial Fibrillation, GERD, HLD, Chronic pain  ? ?Primary concerns for visit include: ?Atrial Fibrillation, Chronic Pain  ? ?Recent office visits:  ?10/17/2021 OV (PCP) Shelva Majestic, MD; Trial gabapentin 100 mg for the next week,  If not doing better in a week can try 200 mg as long as there are no side effects.  If any side effects like previous please stop immediately and let us know. We are trying lower dose to see if that helps, - interested in celebrex but I worry about GI bleed risk on Xarelto ? ?07/30/2021 OV (Family Medicine) Dulce Sellar, NP; Mupirocin ointment sent to pharmacy, if not improving within a week, pt instructed to call back and will refer to ENT. ? ?06/11/2021 OV (PCP) Shelva Majestic, MD; Encourage patient to transition to rosuvastatin-rosuvastatin crossing blood-brain barrier less than atorvastatin hopeful this will be helpful in regards to concerns about memory ? ?Recent consult visits:  ?11/20/2021 OV (Orthopedics) Huel Cote, MD; no medication changes indicated. ? ?06/14/2021 OV (Neurology) Marcos Eke, PA-C; no medication changes indicated. ? ?06/13/2021 OV (Cardiology) Pricilla Riffle, MD; no medication changes indicated. ? ?Hospital visits:  ?06/06/2021 ED visit due to Dizziness at [facility] ?-No medication changes indicated. ? ?Medications: ?Outpatient Encounter Medications as of 11/26/2021  ?Medication Sig Note  ? acetaminophen (TYLENOL) 500 MG tablet Take 1,000 mg by mouth every 6 (six) hours as needed for mild pain, moderate pain, fever or headache.   ? buPROPion (WELLBUTRIN XL) 150 MG 24 hr tablet Take 450 mg by mouth every morning. 10/17/2021: Taking 300 mg total  ? cephALEXin (KEFLEX) 250 MG capsule Take 250 mg by  mouth daily. Takes daily for frequent UTIs.   ? cholecalciferol (VITAMIN D) 1000 units tablet Take 1,000 Units by mouth daily.   ? co-enzyme Q-10 30 MG capsule Take 30 mg by mouth daily.   ? Cyanocobalamin (B-12 PO) Take by mouth.   ? FERROUS SULFATE PO Take by mouth.   ? flecainide (TAMBOCOR) 100 MG tablet Take 1 tablet (100 mg total) by mouth 2 (two) times daily.   ? gabapentin (NEURONTIN) 100 MG capsule Take 1-2 capsules (100-200 mg total) by mouth at bedtime as needed.   ? Melatonin 3 MG TABS Take 2 tablets by mouth at bedtime.    ? Multiple Vitamin (MULTIVITAMIN WITH MINERALS) TABS tablet Take 1 tablet by mouth daily.   ? mupirocin ointment (BACTROBAN) 2 % Place 1 application into the nose 2 (two) times daily.   ? ondansetron (ZOFRAN ODT) 4 MG disintegrating tablet Take 1 tablet (4 mg total) by mouth every 8 (eight) hours as needed for nausea or vomiting.   ? pantoprazole (PROTONIX) 20 MG tablet Take 1 tablet (20 mg total) by mouth 2 (two) times daily before a meal. Take 2 tablets daily.   ? predniSONE (DELTASONE) 20 MG tablet Take 2 pills for 3 days, 1 pill for 4 days   ? rivaroxaban (XARELTO) 20 MG TABS tablet Take 1 tablet (20 mg total) by mouth daily with supper.   ? rosuvastatin (CRESTOR) 20 MG tablet Take 1 tablet (20 mg total) by mouth daily.   ? sertraline (ZOLOFT) 50 MG tablet Take 1 tablet (50 mg total) by mouth daily.   ? ?No facility-administered encounter medications  on file as of 11/26/2021.  ? ?Current Medications: ?Prednisone 20 mg last filled 10/17/2021 7 DS ?Gabapentin 100 mg last filled 11/13/2021 30 DS ?Mupirocin ointment 2% last filled 07/30/2021 10 DS ?Bupropion 150 mg last filled 10/22/2021 30 DS ?Rosuvastatin 20 mg last filled 09/05/2021 90 DS ?Flecainide 100 mg last filled 11/20/2021 90 DS ?Xarelto 20 mg last filled 09/19/2021 90 DS ?Pantoprazole 20 mg last filled 10/25/2021 90 DS ?Zofran 4 mg last filled 03/20/2021 7 DS ?Sertraline 50 mg last filled 11/06/2021 45 DS ?Keflex 250 mg last  filled 10/30/2021 90 DS ?Ferrous Sulfate 325 mg - no longer taking, bothersome to patient ?Vitamin B12 takes daily ?Co-enzyme Q-10 30 mg takes daily ?Vitamin D 1000 units takes daily ?Acetaminophen 500 mg takes as needed ?Multiple Vitamin with Minerals takes daily ?Melatonin 3 mg takes nightly ? ?Patient Questions: ?Any changes in your medications or health? ?Patient states she recently started  taking Gabapentin. She also had some recent hip injections to help with pain. ? ?Any side effects from any medications?  ?Patient denies any side effects from any of her medications. ? ?Do you have any symptoms or problems not managed by your medications? ?Patient states she has a lot of pain in her hips. ? ?Any concerns about your health right now? ?Patient denies any concerns with her health right now. ? ?Has your provider asked that you check blood pressure, blood sugar, or follow special diet at home? ?Patient does not check blood pressure or blood sugar. She states she stays away from greasy, fried and spicy foods ? ?Do you get any type of exercise on a regular basis? ?Patient walks 1 mile 3x/week. ? ?Can you think of a goal you would like to reach for your health? ?To reduce the amount of hip pain. ? ?Do you have any problems getting your medications? ?Patient denies having any problems getting her medications. ? ?Is there anything that you would like to discuss during the appointment?  ?Patient states she does not have anything she would like to discuss at this time. ? ?Please bring medications and supplements to appointment ? ?Care Gaps: ?Medicare Annual Wellness: Completed 09/09/2021 ?Hemoglobin A1C: none available ?Colonoscopy: Next due on 01/01/2030 ?Dexa Scan: Completed ?Mammogram: Next due on 08/28/2023 ? ?Future Appointments  ?Date Time Provider Department Center  ?11/29/2021  9:30 AM [facility] CCM PHARMACIST [facility]  ?12/05/2021 11:30 AM [facility] 2 [facility] [address] [facility]  ?12/05/2021 12:10 PM [facility] MR 2  [facility] [address]  ?12/25/2021  9:15 AM Huel Cote, MD [facility]  ?06/09/2022  9:40 AM Shelva Majestic, MD [facility]  ?09/29/2022  3:15 PM [facility] HEALTH COACH [facility]  ? ? ?Star Rating Drugs: ?Rosuvastatin 20 mg last filled 09/05/2021 90 DS ? ?April D Calhoun, CMA ?Clinical Pharmacist Assistant ?[phone]  ?

## 2021-11-28 ENCOUNTER — Encounter: Payer: Medicare HMO | Admitting: Psychology

## 2021-11-29 ENCOUNTER — Ambulatory Visit (INDEPENDENT_AMBULATORY_CARE_PROVIDER_SITE_OTHER): Payer: Medicare HMO | Admitting: Pharmacist

## 2021-11-29 DIAGNOSIS — K219 Gastro-esophageal reflux disease without esophagitis: Secondary | ICD-10-CM

## 2021-11-29 DIAGNOSIS — I4891 Unspecified atrial fibrillation: Secondary | ICD-10-CM

## 2021-11-29 DIAGNOSIS — F325 Major depressive disorder, single episode, in full remission: Secondary | ICD-10-CM

## 2021-11-29 DIAGNOSIS — E785 Hyperlipidemia, unspecified: Secondary | ICD-10-CM

## 2021-11-29 NOTE — Patient Instructions (Addendum)
Visit Information ? ? Goals Addressed   ? ?  ?  ?  ?  ? This Visit's Progress  ?  Manage My Medicine     ?  Timeframe:  Long-Range Goal ?Priority:  High ?Start Date:    11/29/21                         ?Expected End Date:   06/01/22                   ? ?Follow Up Date 03/01/22  ?  ?Increase exercise focus on adherence to meet LDL < 70 goal.  ?  ?Why is this important?   ?These steps will help you keep on track with your medicines. ?  ?Notes:  ?  ? ?  ? ?Patient Care Plan: General Pharmacy (Adult)  ?  ? ?Problem Identified: Afib, GERD, HLD, Depression,   ?Priority: High  ?Onset Date: 11/29/2021  ?  ? ?Long-Range Goal: Patient-Specific Goal   ?Start Date: 11/29/2021  ?Expected End Date: 06/01/2022  ?This Visit's Progress: On track  ?Priority: High  ?Note:   ?Current Barriers:  ?LDL above goal ?Hip pain ?Sleep disturbances ? ?Pharmacist Clinical Goal(s):  ?Patient will achieve improvement in LDL as evidenced by labs through collaboration with PharmD and provider.  ? ?Interventions: ?1:1 collaboration with Marin Olp, MD regarding development and update of comprehensive plan of care as evidenced by provider attestation and co-signature ?Inter-disciplinary care team collaboration (see longitudinal plan of care) ?Comprehensive medication review performed; medication list updated in electronic medical record ? ?Hyperlipidemia: (LDL goal < 70) ?-Uncontrolled ?-Current treatment: ?Rosuvastatin 20mg  Appropriate, Query effective ?-Medications previously tried: atorvastatin  ?-Current exercise habits: walks at least 4-5 days per week ?-Educated on Cholesterol goals;  ?Benefits of statin for ASCVD risk reduction; ?Importance of limiting foods high in cholesterol; ?-Not at goal < 70 last September - switched to Crestor 20mg  and has not had lipids checked since.  She has physical with Dr. Yong Channel in September this year and will recheck lipids. She has been able to increase physical activity recently because she is not in as much  hip pain as she was previously.  No changes at this time - if lipids remain elevated could increase statin to Rosuvastatin 40mg  in September. ? ?Atrial Fibrillation (Goal: prevent stroke and major bleeding) ?-Controlled ?-Current treatment: ?Rate control: Flecainide 100mg  twice daily Appropriate, Effective, Safe, Accessible ?Anticoagulation: Xarelto 20mg  Appropriate, Effective, Safe, Accessible ?-Medications previously tried: none noted ?-Home BP and HR readings: 100/60  ?-Counseled on increased risk of stroke due to Afib and benefits of anticoagulation for stroke prevention; ?bleeding risk associated with Xarelto and importance of self-monitoring for signs/symptoms of bleeding; ?-Recommended to continue current medication ?Assessed patient finances. She does not have any issues with Xarelto copay. ? ?Depression/Anxiety (Goal: Minimize symptoms) ?-Controlled ?-Current treatment: ?Sertraline 50mg  twice daily Appropriate, Effective, Safe, Accessible ?Bupropion XL 300mg  Appropriate, Effective, Safe, Query accessible ?-Medications previously tried/failed: olanzapine, trazodone, lorazepam ?-PHQ9:  ?PHQ9 SCORE ONLY 10/17/2021 09/09/2021 06/06/2021  ?PHQ-9 Total Score 12 0 15  ? ?-GAD7: No flowsheet data found. ? ?-Educated on Benefits of medication for symptom control ?-Patient reports stable mood, meds are managed by Dr. Casimiro Needle psychiatrist.  She takes Wellbutrin in the morning and Zoloft as 50mg  twice daily.  She mentions having a hard time with waking in the middle of the night and not being able to get back to sleep.  We discussed changing the  timing of her zoloft from twice daily to taking just 100mg  in the morning to see if this helps with sleep disturbances.  Patient wants to discuss with Dr. Casimiro Needle at upcoming appointment.  ? ?GERD (Goal: Minimize symptoms) ?-Controlled ?-Current treatment  ?Pantoprazole 20mg  twice daily Query Appropriate, ?-Medications previously tried: none noted ?- She takes twice daily and has  not had any issues since starting this med.  Working on trigger foods - especially fried/spicy  ?-Recommended to continue current medication ?Could consider step down in the future.  Recommend DEXA with PPI use. ? ?Patient Goals/Self-Care Activities ?Patient will:  ?- take medications as prescribed as evidenced by patient report and record review ?focus on medication adherence by pill box ?target a minimum of 150 minutes of moderate intensity exercise weekly ? ?Follow Up Plan: The care management team will reach out to the patient again over the next 365 days.  ? ?  ? ? ?Ms. Cypress was given information about Chronic Care Management services today including:  ?CCM service includes personalized support from designated clinical staff supervised by her physician, including individualized plan of care and coordination with other care providers ?24/7 contact phone numbers for assistance for urgent and routine care needs. ?Standard insurance, coinsurance, copays and deductibles apply for chronic care management only during months in which we provide at least 20 minutes of these services. Most insurances cover these services at 100%, however patients may be responsible for any copay, coinsurance and/or deductible if applicable. This service may help you avoid the need for more expensive face-to-face services. ?Only one practitioner may furnish and bill the service in a calendar month. ?The patient may stop CCM services at any time (effective at the end of the month) by phone call to the office staff. ? ?Patient agreed to services and verbal consent obtained.  ? ?The patient verbalized understanding of instructions, educational materials, and care plan provided today and agreed to receive a mailed copy of patient instructions, educational materials, and care plan.  ?Telephone follow up appointment with pharmacy team member scheduled for:1 year ? ?Edythe Clarity, [facility]  ?Beverly Milch, PharmD ?Clinical Pharmacist   ?Orvan July ?([phone] ?

## 2021-12-02 ENCOUNTER — Encounter: Payer: Self-pay | Admitting: Family Medicine

## 2021-12-04 ENCOUNTER — Other Ambulatory Visit: Payer: Medicare HMO

## 2021-12-05 ENCOUNTER — Other Ambulatory Visit: Payer: Medicare HMO

## 2021-12-09 ENCOUNTER — Encounter: Payer: Self-pay | Admitting: Family Medicine

## 2021-12-12 ENCOUNTER — Ambulatory Visit: Payer: Medicare HMO | Admitting: Physician Assistant

## 2021-12-16 NOTE — Progress Notes (Signed)
? ?Phone [phone] ?In person visit ?  ?Subjective:  ? ?Danielle Rocha is a 71 y.o. year old very pleasant female patient who presents for/with See problem oriented charting ?Chief Complaint  ?Patient presents with  ? Insomnia  ?  Pt c/o sleep issues for a while that she mentioned when she was here last time. She is sleeping 2-4 hours a night, she can't get to sleep nor stay asleep.  ? ? ?This visit occurred during the SARS-CoV-2 public health emergency.  Safety protocols were in place, including screening questions prior to the visit, additional usage of staff PPE, and extensive cleaning of exam room while observing appropriate contact time as indicated for disinfecting solutions.  ? ?Past Medical History-  ?Patient Active Problem List  ? Diagnosis Date Noted  ? Dystonia   ?  Priority: High  ? Major depression in full remission ([place]) 09/02/2016  ?  Priority: High  ? History of SI/intentional drug overdose 11/22/2015  ?  Priority: High  ? Atrial fibrillation ([place])   ?  Priority: High  ? Chronic pain syndrome   ?  Priority: High  ? Aortic atherosclerosis ([place]) 02/07/2020  ?  Priority: Medium   ? Delayed gastric emptying 02/07/2020  ?  Priority: Medium   ? Restless legs 07/27/2018  ?  Priority: Medium   ? Recurrent UTI 10/07/2016  ?  Priority: Medium   ? Hyperlipidemia   ?  Priority: Medium   ? GERD (gastroesophageal reflux disease) 01/30/2016  ?  Priority: Low  ? Sore in nose 07/30/2021  ? Involuntary movements 02/14/2020  ? Depression with anxiety 02/14/2020  ? ? ?Medications- reviewed and updated ?Current Outpatient Medications  ?Medication Sig Dispense Refill  ? acetaminophen (TYLENOL) 500 MG tablet Take 1,000 mg by mouth every 6 (six) hours as needed for mild pain, moderate pain, fever or headache.    ? buPROPion (WELLBUTRIN XL) 150 MG 24 hr tablet Take 150 mg by mouth daily. Per psychiatry    ? cephALEXin (KEFLEX) 250 MG capsule Take 250 mg by mouth daily. Takes daily for frequent UTIs.    ? cholecalciferol  (VITAMIN D) 1000 units tablet Take 1,000 Units by mouth daily.    ? co-enzyme Q-10 30 MG capsule Take 30 mg by mouth daily.    ? Cyanocobalamin (B-12 PO) Take by mouth.    ? FERROUS SULFATE PO Take by mouth.    ? flecainide (TAMBOCOR) 100 MG tablet Take 1 tablet (100 mg total) by mouth 2 (two) times daily. 180 tablet 3  ? Melatonin 3 MG TABS Take 2 tablets by mouth at bedtime.     ? Multiple Vitamin (MULTIVITAMIN WITH MINERALS) TABS tablet Take 1 tablet by mouth daily.    ? ondansetron (ZOFRAN ODT) 4 MG disintegrating tablet Take 1 tablet (4 mg total) by mouth every 8 (eight) hours as needed for nausea or vomiting. 20 tablet 0  ? pantoprazole (PROTONIX) 20 MG tablet Take 1 tablet (20 mg total) by mouth 2 (two) times daily before a meal. Take 2 tablets daily. 180 tablet 3  ? rivaroxaban (XARELTO) 20 MG TABS tablet Take 1 tablet (20 mg total) by mouth daily with supper. 90 tablet 3  ? rosuvastatin (CRESTOR) 20 MG tablet Take 1 tablet (20 mg total) by mouth daily. 90 tablet 3  ? sertraline (ZOLOFT) 50 MG tablet Take 1 tablet (50 mg total) by mouth daily. 30 tablet 6  ? traZODone (DESYREL) 50 MG tablet Take 0.5-1 tablets (25-50 mg total) by  mouth at bedtime as needed for sleep. 30 tablet 5  ? ?No current facility-administered medications for this visit.  ? ?  ?Objective:  ?BP 114/60   Pulse 69   Temp 97.8 ?F (36.6 ?C)   Ht 5\' 8"  (1.727 m)   Wt 151 lb 3.2 oz (68.6 kg)   LMP  (LMP Unknown)   SpO2 97%   BMI 22.99 kg/m?  ?Gen: NAD, resting comfortably ?-Please refer to neuro exam from last visit ? ?  ? ?Assessment and Plan  ? ?#General update-since having Wellbutrin decreased she has felt much more mental clarity-had 2 other medications decreased at that time but feels like she is much back to her overall self ? ?# insomnia ?#Bilateral hip and glute pain ?#headaches ?S:Pt reported having trouble sleeping.  Typically sleeping 2 to 4 hours a night.  She has trouble both falling asleep and staying asleep ?-Has been on  trazodone up to 300 mg in the past and states this worked. She still thinks cognitive issues were affected by wellbutrin higher dose- down to 150mg  from 450 mg  ? ?Last visit we also wondered if chronic pain and long-term issues with hip and buttocks could be contributing-she had been on higher doses of gabapentin with side effects but we tried a much lower dose at 200 mg.  She reports no benefit from this. ?-She also been referred to orthopedics Dr. Sammuel Hines and was found to have gluteus medius tear.  ?-Did have improvement in hip pain down 2/10 at its lowest for a few weeks after hip injections with Dr. Sammuel Hines but later recurred (longest she has had relief that she can remember!  ?- Doing some low impact exercises for glute tear seen on MRI but if does not improve may need surgery.  ? ?- patient with ongoing headaches since december- taking tylenol around the clock mostly with pain down from 7-8/10 to 5/10 which is tolerable for her.  ?-did have MRI 06/06/21 but headaches started in December ?-Thursday of last week had nosebleeds 8 in 1 day after blowing her nose. Pinched bridge of nose but would restart with sitting up. Went to walk in - was sent to ER for posterior but was not bleeding - has not bled again and has not used afrin which was mentioned. We did not think this was related to her her headaches ? ? ?A/P: 71 year old female with ongoing insomnia issues since coming off of trazodone 300 mg in the past.  Did not respond well to gabapentin up to 200 mg to see if pain was contributing (much lower dose than she had been on in the past) ?-We opted to try a much lower dose of trazodone at 25 to 50 mg ?-Stay off gabapentin does seem to help restless legs some ?- Also discussed reviewing this with psychiatry.  I do not think sertraline at 50 mg plus low-dose trazodone place her at significant risk for serotonin syndrome ? ?We strongly considered pursuing MRI of the brain but had one in September (though this was  before headaches began).  She would like to see if her headaches improve with improving insomnia first.  We also wonder if rebound headaches are contributing with daily Tylenol and discussed potentially trying off of this for at least 2 weeks but waiting until after we trial to adjust insomnia first ?- Has also seen neurology in the past and I encouraged her to schedule follow-up-she would like to hold off unless symptoms fail to improve ? ?Thrilled  hip pain and glue pain has improved-continue close follow-up with orthopedics at [place] [facility] at [place] ? ?#Atrial fibrillation ?S:Compliant with Xarelto 20 mg for anticoagulation. Flecainide as an antiarrhythmic. Does not follow with cardiology regularly ?A/P: On antiarrhythmic-appears at minimum rate controlled.  Appropriately anticoagulated-continue current medication ? ?Recommended follow up: Return for next already scheduled visit or sooner if needed. ?Future Appointments  ?Date Time Provider [place]  ?01/31/2022  1:45 PM Vanetta Mulders, MD [facility]  ?06/09/2022  9:40 AM Marin Olp, MD [facility]  ?09/29/2022  3:15 PM [facility] HEALTH COACH [facility]  ?12/02/2022  3:00 PM [facility] CCM PHARMACIST [facility]  ? ? ?Lab/Order associations: ?  ICD-10-CM   ?1. Hyperlipidemia, unspecified hyperlipidemia type  E78.5   ?  ?2. Aortic atherosclerosis (HCC)  I70.0   ?  ?3. Gastroesophageal reflux disease without esophagitis  K21.9   ?  ?4. Major depressive disorder in full remission, unspecified whether recurrent ([place])  F32.5   ?  ?5. Atrial fibrillation, unspecified type ([place])  I48.91   ?  ?6. Dystonia  G24.9   ?  ? ? ?Meds ordered this encounter  ?Medications  ? traZODone (DESYREL) 50 MG tablet  ?  Sig: Take 0.5-1 tablets (25-50 mg total) by mouth at bedtime as needed for sleep.  ?  Dispense:  30 tablet  ?  Refill:  5  ? ? ?Return precautions advised.  ?Garret Reddish, MD ? ? ?

## 2021-12-17 ENCOUNTER — Other Ambulatory Visit: Payer: Self-pay

## 2021-12-17 ENCOUNTER — Ambulatory Visit
Admission: RE | Admit: 2021-12-17 | Discharge: 2021-12-17 | Disposition: A | Discharge status: Home / Self Care | Payer: Medicare HMO | Source: Ambulatory Visit | Attending: Orthopaedic Surgery | Admitting: Orthopaedic Surgery

## 2021-12-17 DIAGNOSIS — M1611 Unilateral primary osteoarthritis, right hip: Secondary | ICD-10-CM | POA: Diagnosis not present

## 2021-12-17 DIAGNOSIS — R600 Localized edema: Secondary | ICD-10-CM | POA: Diagnosis not present

## 2021-12-17 DIAGNOSIS — M25751 Osteophyte, right hip: Secondary | ICD-10-CM | POA: Diagnosis not present

## 2021-12-17 DIAGNOSIS — M25559 Pain in unspecified hip: Secondary | ICD-10-CM

## 2021-12-17 DIAGNOSIS — M7061 Trochanteric bursitis, right hip: Secondary | ICD-10-CM | POA: Diagnosis not present

## 2021-12-17 DIAGNOSIS — Z981 Arthrodesis status: Secondary | ICD-10-CM | POA: Diagnosis not present

## 2021-12-17 DIAGNOSIS — M1612 Unilateral primary osteoarthritis, left hip: Secondary | ICD-10-CM | POA: Diagnosis not present

## 2021-12-18 ENCOUNTER — Other Ambulatory Visit: Payer: Medicare HMO

## 2021-12-19 ENCOUNTER — Encounter (HOSPITAL_COMMUNITY): Payer: Self-pay | Admitting: Emergency Medicine

## 2021-12-19 ENCOUNTER — Emergency Department (HOSPITAL_COMMUNITY)
Admission: EM | Admit: 2021-12-19 | Discharge: 2021-12-19 | Disposition: A | Discharge status: Home / Self Care | Payer: Medicare HMO | Attending: Emergency Medicine | Admitting: Emergency Medicine

## 2021-12-19 DIAGNOSIS — Z7901 Long term (current) use of anticoagulants: Secondary | ICD-10-CM | POA: Insufficient documentation

## 2021-12-19 DIAGNOSIS — R040 Epistaxis: Secondary | ICD-10-CM | POA: Diagnosis not present

## 2021-12-19 NOTE — ED Provider Notes (Signed)
?[facility] DEPT ?Provider Note ? ? ?CSN: [id number] ?Arrival date & time: 12/19/21  1914 ? ?  ? ?History ? ?Chief Complaint  ?Patient presents with  ? Epistaxis  ? ? ?Danielle Rocha is a 71 y.o. female. ? ?Patient with history of atrial fibrillation on Xarelto presents today with complaints of epistaxis.  She states that same began yesterday evening and has been intermittent since then.  States that she was originally able to get it under control with compression, however started again today with several intermittent episodes lasting throughout the day.  [facility] and provider there was unable to find a source of bleeding and suspected that it was a posterior bleed and sent her here for further evaluation.  She states that soon after she left [facility] around 6:30 this evening the bleeding stopped and has not restarted.  She denies any dizziness, lightheadedness, fatigue, shortness of breath.  Last took her Xarelto yesterday evening. ? ?The history is provided by the patient. No language interpreter was used.  ?Epistaxis ?Associated symptoms: no dizziness, no fever and no sinus pain   ? ?  ? ?Home Medications ?Prior to Admission medications   ?Medication Sig Start Date End Date Taking? Authorizing Provider  ?acetaminophen (TYLENOL) 500 MG tablet Take 1,000 mg by mouth every 6 (six) hours as needed for mild pain, moderate pain, fever or headache.    [provider]  ?buPROPion (WELLBUTRIN XL) 150 MG 24 hr tablet Take 450 mg by mouth every morning. 06/17/21   [provider]  ?cephALEXin (KEFLEX) 250 MG capsule Take 250 mg by mouth daily. Takes daily for frequent UTIs. 10/25/19   [provider]  ?cholecalciferol (VITAMIN D) 1000 units tablet Take 1,000 Units by mouth daily.    [provider]  ?co-enzyme Q-10 30 MG capsule Take 30 mg by mouth daily.    [provider]  ?Cyanocobalamin (B-12 PO) Take by mouth.    [provider]  ?FERROUS SULFATE PO Take by mouth. ?Patient not taking: Reported on 11/29/2021    [provider]  ?flecainide (TAMBOCOR) 100 MG tablet Take 1 tablet (100 mg total) by mouth 2 (two) times daily. 06/06/21   Marin Olp, MD  ?gabapentin (NEURONTIN) 100 MG capsule Take 1-2 capsules (100-200 mg total) by mouth at bedtime as needed. 10/17/21   Marin Olp, MD  ?Melatonin 3 MG TABS Take 2 tablets by mouth at bedtime.     [provider]  ?Multiple Vitamin (MULTIVITAMIN WITH MINERALS) TABS tablet Take 1 tablet by mouth daily.    [provider]  ?ondansetron (ZOFRAN ODT) 4 MG disintegrating tablet Take 1 tablet (4 mg total) by mouth every 8 (eight) hours as needed for nausea or vomiting. 03/20/21   Gareth Morgan, MD  ?pantoprazole (PROTONIX) 20 MG tablet Take 1 tablet (20 mg total) by mouth 2 (two) times daily before a meal. Take 2 tablets daily. 04/19/21   Esterwood, Amy S, PA-C  ?rivaroxaban (XARELTO) 20 MG TABS tablet Take 1 tablet (20 mg total) by mouth daily with supper. 06/06/21   Marin Olp, MD  ?rosuvastatin (CRESTOR) 20 MG tablet Take 1 tablet (20 mg total) by mouth daily. 06/07/21   Marin Olp, MD  ?sertraline (ZOLOFT) 50 MG tablet Take 1 tablet (50 mg total) by mouth daily. 02/14/20   Marcial Pacas, MD  ?   ? ?Allergies    ?Reglan [metoclopramide], Ciprofloxacin, Clindamycin/lincomycin, Doxycycline, Macrobid [  nitrofurantoin monohyd macro], and Sulfa antibiotics   ? ?Review of Systems   ?Review of Systems  ?Constitutional:  Negative for chills, fatigue and fever.  ?HENT:  Positive for nosebleeds. Negative for sinus pressure and sinus pain.   ?Respiratory:  Negative for shortness of breath.   ?Gastrointestinal:  Negative for abdominal pain, nausea and vomiting.  ?Neurological:  Negative for dizziness and light-headedness.  ?All other systems reviewed and are negative. ? ?Physical Exam ?Updated Vital Signs ?BP 137/65 (BP Location: Left Arm)   Pulse 75    Temp 98.1 ?F (36.7 ?C) (Oral)   Resp 16   Ht 5\' 8"  (1.727 m)   Wt 67.1 kg   LMP  (LMP Unknown)   SpO2 95%   BMI 22.50 kg/m?  ?Physical Exam ?Vitals and nursing note reviewed.  ?Constitutional:   ?   General: She is not in acute distress. ?   Appearance: Normal appearance. She is normal weight. She is not ill-appearing, toxic-appearing or diaphoretic.  ?   Comments: Patient resting comfortably in bed in no acute distress  ?HENT:  ?   Head: Normocephalic and atraumatic.  ?   Nose:  ?   Comments: No active bleeding from either nare.  No scabbing or blood visualized on otoscope exam of bilateral layers ?   Mouth/Throat:  ?   Comments: No blood or other abnormalities in oropharynx ?Cardiovascular:  ?   Rate and Rhythm: Normal rate.  ?Pulmonary:  ?   Effort: Pulmonary effort is normal. No respiratory distress.  ?Musculoskeletal:     ?   General: Normal range of motion.  ?   Cervical back: Normal range of motion.  ?Skin: ?   General: Skin is warm and dry.  ?Neurological:  ?   General: No focal deficit present.  ?   Mental Status: She is alert.  ?Psychiatric:     ?   Mood and Affect: Mood normal.     ?   Behavior: Behavior normal.  ? ? ?ED Results / Procedures / Treatments   ?Labs ?(all labs ordered are listed, but only abnormal results are displayed) ?Labs Reviewed - No data to display ? ?EKG ?None ? ?Radiology ?No results found. ? ?Procedures ?Procedures  ? ? ?Medications Ordered in ED ?Medications - No data to display ? ?ED Course/ Medical Decision Making/ A&P ?  ?                        ?Medical Decision Making ? ?Patient presents today with complaints of epistaxis.  She is not currently bleeding upon visualization of bilateral nares and oropharynx.  She is afebrile, nontoxic-appearing, and in no acute distress with reassuring vital signs.  She denies any symptoms of anemia, therefore labs are not indicated at this time.  I discussed Rhino Rocket application with patient which she does not want at this time given  no active bleeding.  Plan to hold Xarelto as she is on it for afib, no hx of blood clots or CVA, and follow-up with ENT in the next few days for continued management.  Also recommend Afrin use if bleeding begins again.  Close return precautions given, patient understanding and amenable with plan.  Educated on red flag symptoms that would prompt immediate return.  Discharged in stable condition. ? ?This is a shared visit with supervising physician Dr. Sherry Ruffing who has independently evaluated patient & provided guidance in evaluation/management/disposition, in agreement with care  ? ?Final Clinical Impression(s) /  ED Diagnoses ?Final diagnoses:  ?Epistaxis  ? ? ?Rx / DC Orders ?ED Discharge Orders   ? ? None  ? ?  ?An After Visit Summary was printed and given to the patient. ? ? ?  ?Bud Face, PA-C ?12/19/21 2103 ? ?  ?Tegeler, Gwenyth Allegra, MD ?12/20/21 0006 ? ?

## 2021-12-19 NOTE — Discharge Instructions (Addendum)
As we discussed, as you are not having active bleeding at this time, I do not feel that intervention is indicated.  I recommend holding your Xarelto and scheduling an appointment with ENT for further evaluation and management.  I have given you a referral to ENT with a number to call to schedule an appointment.  I also recommend purchasing Afrin spray over-the-counter and spraying this in your nose and holding pressure if it bleeding begins. ? ?Return if development of any new or worsening symptoms. ?

## 2021-12-19 NOTE — ED Triage Notes (Signed)
Pt reports epistaxis starting last night. Denies pain or injury. Got it controlled last night, but has had 8 more episodes today. She states that the walk in clinic sent her over because they couldn't see the source of bleeding. Pt is on Xarelto. Reports a headache that started to develop today (after bleeding started).  ?

## 2021-12-20 ENCOUNTER — Ambulatory Visit (HOSPITAL_BASED_OUTPATIENT_CLINIC_OR_DEPARTMENT_OTHER): Payer: Medicare HMO | Admitting: Orthopaedic Surgery

## 2021-12-20 DIAGNOSIS — M25552 Pain in left hip: Secondary | ICD-10-CM

## 2021-12-20 DIAGNOSIS — E785 Hyperlipidemia, unspecified: Secondary | ICD-10-CM

## 2021-12-20 DIAGNOSIS — M67851 Other specified disorders of synovium, right hip: Secondary | ICD-10-CM

## 2021-12-20 DIAGNOSIS — R69 Illness, unspecified: Secondary | ICD-10-CM | POA: Diagnosis not present

## 2021-12-20 DIAGNOSIS — M67853 Other specified disorders of tendon, right hip: Secondary | ICD-10-CM | POA: Diagnosis not present

## 2021-12-20 DIAGNOSIS — I4891 Unspecified atrial fibrillation: Secondary | ICD-10-CM | POA: Diagnosis not present

## 2021-12-20 DIAGNOSIS — M67852 Other specified disorders of synovium, left hip: Secondary | ICD-10-CM

## 2021-12-20 DIAGNOSIS — M25551 Pain in right hip: Secondary | ICD-10-CM | POA: Diagnosis not present

## 2021-12-20 DIAGNOSIS — M67854 Other specified disorders of tendon, left hip: Secondary | ICD-10-CM

## 2021-12-20 DIAGNOSIS — F32A Depression, unspecified: Secondary | ICD-10-CM

## 2021-12-20 DIAGNOSIS — M67959 Unspecified disorder of synovium and tendon, unspecified thigh: Secondary | ICD-10-CM

## 2021-12-20 MED ORDER — LIDOCAINE HCL 1 % IJ SOLN
4.0000 mL | INTRAMUSCULAR | Status: AC | PRN
  Administered 2021-12-20: 4 mL

## 2021-12-20 MED ORDER — TRIAMCINOLONE ACETONIDE 40 MG/ML IJ SUSP
80.0000 mg | INTRAMUSCULAR | Status: AC | PRN
  Administered 2021-12-20: 80 mg via INTRA_ARTICULAR

## 2021-12-20 NOTE — Progress Notes (Signed)
? ?                            ? ? ?Chief Complaint: Bilateral hip pain ?  ? ? ?History of Present Illness:  ? ?12/20/2021: Presents today presents today for follow-up of her bilateral hips.  She does state that she got nearly 2 weeks of almost complete pain relief in terms of bilateral hips and was quite excited about this.  That being said her pain is recurred back to her previous level.  Here today for MRI discussion ? ?Danielle Rocha is a 71 y.o. female presents today with right worse than left hip pain.  This has been going on since 1998.  She has an extremely extensive history of treatment on both of these hips.  She states that the right has been the focus of surgical treatment.  Specifically she did have a right piriformis release done in 1999 in [place] as well as an L4-L5 fusion which was done at [facility] around this time.  She states that she got no relief from either of these procedures.  She continues to experience pain about the lateral aspect of the hips.  She has had multiple injections into the SI and into the hips bilaterally.  These were all done many years prior.  She has been working on physical therapy for multiple years and most recently began working on the hips a year prior.  She is not getting any type of long-term relief.  She is the mother of Danielle Rocha who I have been treating for his knee. ? ? ? ?Surgical History:   ?As above ? ?PMH/PSH/Family History/Social History/Meds/Allergies:   ? ?Past Medical History:  ?Diagnosis Date  ? Arthritis   ? DJD, low back, thumb  ? Atrial fibrillation ([place])   ? Xarelto anticoagulation. Flecainide antiarrythmic   ? Chicken pox   ? Chronic pain syndrome   ? On disability. History of bilateral hip pain, low back pain. Gabapentin 400 BID, percocet 1 tablet daily per prior provider.   ? Colon polyp   ? awaiting records  ? Depression   ? zoloft 100mg , remeron 30mg  per psychiatry. ambien 10mg  per psychiatry to help wtih sleep element.   ? Dystonia   ?  described as psychogenic dystonia. Pain and twisting from upper chest and up with triggers "wind, creamy food" on TID ativan per psychiatry previously.   ? Fibromyalgia   ? GERD (gastroesophageal reflux disease)   ? omeprazole OTC  ? Goiter   ? states multiple imaging tests, has had biopsies  ? Hyperlipidemia   ? lovastatin 20mg   ? Stroke [facility])   ? TIA (left side of face and body decreased sensitivity than right face and side)  ? TIA (transient ischemic attack)   ? Vaginal atrophy   ? estrace vaginal cream  ? ?Past Surgical History:  ?Procedure Laterality Date  ? ATRIAL FIBRILLATION ABLATION    ? BIOPSY THYROID    ? fusion l4-l5  09/22/1998  ? LAMINECTOMY    ? L4-L5  ? OTHER SURGICAL HISTORY    ? tennis elbow surgery  ? piriformis release  09/22/1997  ? right hip  ? TONSILLECTOMY AND ADENOIDECTOMY  age 12  ? VAGINAL HYSTERECTOMY  09/23/1991  ? ?Social History  ? ?Socioeconomic History  ? Marital status: Married  ?  Spouse name: Not on file  ? Number of children: 3  ? Years of education: 28  ?  Highest education level: Not on file  ?Occupational History  ? Occupation: retired  ?Tobacco Use  ? Smoking status: Never  ? Smokeless tobacco: Never  ?Vaping Use  ? Vaping Use: Never used  ?Substance and Sexual Activity  ? Alcohol use: No  ?  Alcohol/week: 0.0 standard drinks  ? Drug use: No  ? Sexual activity: Not Currently  ?  Partners: Male  ?Other Topics Concern  ? Not on file  ?Social History Narrative  ? Family: Married. Husband works as Geophysicist/field seismologist for [employer]. 2 children from previous marriage 1 from current. Son Danielle Rocha lives with them and has down's syndrome.   ?   ? 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 Moved to [facility] from [id number] and became disabled in that time frame due to hip/back issues.   ?   ? Hobbies: time with son  ? Right handed  ? One story home  ? Caffeine occasionally  ? ?Social Determinants of Health  ? ?Financial Resource Strain: Low Risk   ? Difficulty of Paying  Living Expenses: Not hard at all  ?Food Insecurity: No Food Insecurity  ? Worried About Charity fundraiser in the Last Year: Never true  ? Ran Out of Food in the Last Year: Never true  ?Transportation Needs: No Transportation Needs  ? Lack of Transportation (Medical): No  ? Lack of Transportation (Non-Medical): No  ?Physical Activity: Insufficiently Active  ? Days of Exercise per Week: 3 days  ? Minutes of Exercise per Session: 30 min  ?Stress: No Stress Concern Present  ? Feeling of Stress : Not at all  ?Social Connections: Unknown  ? Frequency of Communication with Friends and Family: More than three times a week  ? Frequency of Social Gatherings with Friends and Family: More than three times a week  ? Attends Religious Services: More than 4 times per year  ? Active Member of Clubs or Organizations: No  ? Attends Archivist Meetings: Never  ? Marital Status: Not on file  ? ?Family History  ?Problem Relation Age of Onset  ? Alcohol abuse Mother   ?     possible she had stomach cancer as well but unsure  ? Hypertension Father   ? Alcohol abuse Father   ? Pancreatic cancer Father   ? Cancer Brother   ?     spindle cell RLE  ? Diabetes Brother   ? Cancer Brother   ?     tonsil cancer  ? Skin cancer Brother   ? Colon cancer Neg Hx   ? Colon polyps Neg Hx   ? Stomach cancer Neg Hx   ? ?Allergies  ?Allergen Reactions  ? Reglan [Metoclopramide] Other (See Comments)  ?  Suicidal thoughts  ? Ciprofloxacin Diarrhea  ? Clindamycin/Lincomycin Diarrhea  ? Doxycycline Diarrhea and Nausea And Vomiting  ? Macrobid [Nitrofurantoin Monohyd Macro] Rash  ?  Rash on lip  ? Sulfa Antibiotics Rash  ? ?Current Outpatient Medications  ?Medication Sig Dispense Refill  ? acetaminophen (TYLENOL) 500 MG tablet Take 1,000 mg by mouth every 6 (six) hours as needed for mild pain, moderate pain, fever or headache.    ? buPROPion (WELLBUTRIN XL) 150 MG 24 hr tablet Take 450 mg by mouth every morning.    ? cephALEXin (KEFLEX) 250 MG  capsule Take 250 mg by mouth daily. Takes daily for frequent UTIs.    ? cholecalciferol (VITAMIN D) 1000 units tablet Take 1,000 Units by mouth daily.    ?  co-enzyme Q-10 30 MG capsule Take 30 mg by mouth daily.    ? Cyanocobalamin (B-12 PO) Take by mouth.    ? FERROUS SULFATE PO Take by mouth. (Patient not taking: Reported on 11/29/2021)    ? flecainide (TAMBOCOR) 100 MG tablet Take 1 tablet (100 mg total) by mouth 2 (two) times daily. 180 tablet 3  ? gabapentin (NEURONTIN) 100 MG capsule Take 1-2 capsules (100-200 mg total) by mouth at bedtime as needed. 60 capsule 5  ? Melatonin 3 MG TABS Take 2 tablets by mouth at bedtime.     ? Multiple Vitamin (MULTIVITAMIN WITH MINERALS) TABS tablet Take 1 tablet by mouth daily.    ? ondansetron (ZOFRAN ODT) 4 MG disintegrating tablet Take 1 tablet (4 mg total) by mouth every 8 (eight) hours as needed for nausea or vomiting. 20 tablet 0  ? pantoprazole (PROTONIX) 20 MG tablet Take 1 tablet (20 mg total) by mouth 2 (two) times daily before a meal. Take 2 tablets daily. 180 tablet 3  ? rivaroxaban (XARELTO) 20 MG TABS tablet Take 1 tablet (20 mg total) by mouth daily with supper. 90 tablet 3  ? rosuvastatin (CRESTOR) 20 MG tablet Take 1 tablet (20 mg total) by mouth daily. 90 tablet 3  ? sertraline (ZOLOFT) 50 MG tablet Take 1 tablet (50 mg total) by mouth daily. 30 tablet 6  ? ?No current facility-administered medications for this visit.  ? ?No results found. ? ?Review of Systems:   ?A ROS was performed including pertinent positives and negatives as documented in the HPI. ? ?Physical Exam :   ?Constitutional: NAD and appears stated age ?Neurological: Alert and oriented ?Psych: Appropriate affect and cooperative ?There were no vitals taken for this visit.  ? ?Comprehensive Musculoskeletal Exam:   ? ?Inspection Right Left  ?Skin No atrophy or gross abnormalities appreciated No atrophy or gross abnormalities appreciated  ?Palpation    ?Tenderness None None  ?Crepitus None None   ?Range of Motion    ?Flexion (passive) 120 120  ?Extension 30 30  ?IR 30 no pain 30 no pain  ?ER 45 with trochanteric pain 45 with trochanteric pain  ?Strength    ?Flexion  5/5 5/5  ?Extension 5/5 5/5  ?Special Tests

## 2021-12-23 ENCOUNTER — Ambulatory Visit (INDEPENDENT_AMBULATORY_CARE_PROVIDER_SITE_OTHER): Payer: Medicare HMO | Admitting: Family Medicine

## 2021-12-23 ENCOUNTER — Encounter: Payer: Self-pay | Admitting: Family Medicine

## 2021-12-23 VITALS — BP 114/60 | HR 69 | Temp 97.8°F | Ht 68.0 in | Wt 151.2 lb

## 2021-12-23 DIAGNOSIS — I4891 Unspecified atrial fibrillation: Secondary | ICD-10-CM

## 2021-12-23 DIAGNOSIS — F325 Major depressive disorder, single episode, in full remission: Secondary | ICD-10-CM

## 2021-12-23 DIAGNOSIS — E785 Hyperlipidemia, unspecified: Secondary | ICD-10-CM

## 2021-12-23 DIAGNOSIS — K219 Gastro-esophageal reflux disease without esophagitis: Secondary | ICD-10-CM

## 2021-12-23 DIAGNOSIS — G249 Dystonia, unspecified: Secondary | ICD-10-CM

## 2021-12-23 DIAGNOSIS — I700 Atherosclerosis of aorta: Secondary | ICD-10-CM

## 2021-12-23 DIAGNOSIS — G4700 Insomnia, unspecified: Secondary | ICD-10-CM

## 2021-12-23 MED ORDER — TRAZODONE HCL 50 MG PO TABS: 25.0000 mg | ORAL_TABLET | Freq: Every evening | ORAL | 5 refills | Status: DC | PRN

## 2021-12-23 NOTE — Patient Instructions (Addendum)
Schedule follow up with neurology for headaches ? ?Lets see how headaches do with better sleep while you are waiting- hong lower dose of trazodone helps without side effects 25-50 mg ? ?I do want you to trial off of tylenol for 2 weeks but I would really like to see you sleeping better first- try to address one issue at a time.  ? ?Recommended follow up: Return for next already scheduled visit or sooner if needed. ?- if headaches or sleep not improving please follow up with me ?

## 2021-12-25 ENCOUNTER — Ambulatory Visit (HOSPITAL_BASED_OUTPATIENT_CLINIC_OR_DEPARTMENT_OTHER): Payer: Medicare HMO | Admitting: Orthopaedic Surgery

## 2021-12-26 ENCOUNTER — Ambulatory Visit (HOSPITAL_BASED_OUTPATIENT_CLINIC_OR_DEPARTMENT_OTHER): Payer: Medicare HMO | Attending: Orthopaedic Surgery | Admitting: Physical Therapy

## 2021-12-26 ENCOUNTER — Encounter (HOSPITAL_BASED_OUTPATIENT_CLINIC_OR_DEPARTMENT_OTHER): Payer: Self-pay | Admitting: Physical Therapy

## 2021-12-26 DIAGNOSIS — R2689 Other abnormalities of gait and mobility: Secondary | ICD-10-CM | POA: Diagnosis not present

## 2021-12-26 DIAGNOSIS — M25651 Stiffness of right hip, not elsewhere classified: Secondary | ICD-10-CM | POA: Diagnosis not present

## 2021-12-26 DIAGNOSIS — M25552 Pain in left hip: Secondary | ICD-10-CM | POA: Insufficient documentation

## 2021-12-26 DIAGNOSIS — M25551 Pain in right hip: Secondary | ICD-10-CM | POA: Diagnosis not present

## 2021-12-26 DIAGNOSIS — M7602 Gluteal tendinitis, left hip: Secondary | ICD-10-CM | POA: Diagnosis not present

## 2021-12-26 DIAGNOSIS — M7601 Gluteal tendinitis, right hip: Secondary | ICD-10-CM | POA: Insufficient documentation

## 2021-12-26 NOTE — Therapy (Signed)
?OUTPATIENT PHYSICAL THERAPY LOWER EXTREMITY EVALUATION ? ? ?Patient Name: Danielle Rocha ?MRN: [id number] ?DOB:01-Mar-1951, 71 y.o., female ?Today's Date: 12/27/2021 ? ? PT End of Session - 12/27/21 [id number]   ? ? Visit Number 1   ? Number of Visits 16   ? Date for PT Re-Evaluation 02/21/22   ? Authorization Type [employer] Medicare progress note at visit 10 Kx at visit 15   ? PT Start Time 1236   ? PT Stop Time 1320   ? PT Time Calculation (min) 44 min   ? Activity Tolerance Patient tolerated treatment well   ? Behavior During Therapy [facility] for tasks assessed/performed   ? ?  ?  ? ?  ? ? ?Past Medical History:  ?Diagnosis Date  ? Arthritis   ? DJD, low back, thumb  ? Atrial fibrillation ([place])   ? Xarelto anticoagulation. Flecainide antiarrythmic   ? Chicken pox   ? Chronic pain syndrome   ? On disability. History of bilateral hip pain, low back pain. Gabapentin 400 BID, percocet 1 tablet daily per prior provider.   ? Colon polyp   ? awaiting records  ? Depression   ? zoloft 100mg , remeron 30mg  per psychiatry. ambien 10mg  per psychiatry to help wtih sleep element.   ? Dystonia   ? described as psychogenic dystonia. Pain and twisting from upper chest and up with triggers "wind, creamy food" on TID ativan per psychiatry previously.   ? Fibromyalgia   ? GERD (gastroesophageal reflux disease)   ? omeprazole OTC  ? Goiter   ? states multiple imaging tests, has had biopsies  ? Hyperlipidemia   ? lovastatin 20mg   ? Stroke [facility])   ? TIA (left side of face and body decreased sensitivity than right face and side)  ? TIA (transient ischemic attack)   ? Vaginal atrophy   ? estrace vaginal cream  ? ?Past Surgical History:  ?Procedure Laterality Date  ? ATRIAL FIBRILLATION ABLATION    ? BIOPSY THYROID    ? fusion l4-l5  09/22/1998  ? LAMINECTOMY    ? L4-L5  ? OTHER SURGICAL HISTORY    ? tennis elbow surgery  ? piriformis release  09/22/1997  ? right hip  ? TONSILLECTOMY AND ADENOIDECTOMY  age 88  ? VAGINAL HYSTERECTOMY  09/23/1991  ? ?Patient  Active Problem List  ? Diagnosis Date Noted  ? Sore in nose 07/30/2021  ? Involuntary movements 02/14/2020  ? Depression with anxiety 02/14/2020  ? Aortic atherosclerosis ([place]) 02/07/2020  ? Delayed gastric emptying 02/07/2020  ? Dystonia   ? Restless legs 07/27/2018  ? Recurrent UTI 10/07/2016  ? Major depression in full remission ([place]) 09/02/2016  ? GERD (gastroesophageal reflux disease) 01/30/2016  ? History of SI/intentional drug overdose 11/22/2015  ? Atrial fibrillation ([place])   ? Chronic pain syndrome   ? Hyperlipidemia   ? ? ?PCP: Marin Olp, MD ? ?REFERRING PROVIDER: Vanetta Mulders, MD ? ?REFERRING DIAG: M67.959 (ICD-10-CM) - Tendinopathy of gluteus medius ? ?THERAPY DIAG:  ?Pain in left hip ? ?Pain in right hip ? ?Other abnormalities of gait and mobility ? ?Stiffness of right hip, not elsewhere classified ? ?ONSET DATE: 1998 was the initial onset  ? ?SUBJECTIVE:  ? ?SUBJECTIVE STATEMENT: ?Patient has been having pain in the right hip and now the left hip since 1998. The pain came on insidiously. She has had an increase in pain over the years. She is to the point now where she is having difficulty with all mobility Sh  has improved some over the last few years. She is in a constant pain now at this time. She has pain siting and standing. The worst pain comes when she stands and walks for too long. Of not she has begun walking 1 mile a day 3x a week. It causes her pain but she is able to do it.  ? ?PERTINENT HISTORY: ?L4-L5 fusion 2000; A-fib; chronic pain syndrome; depression ; dystonia ; fibromyalgia;  ? ?PAIN:  ?Are you having pain? Yes: NPRS scale: 7/10 today 8/10 at worst  ?Pain location: starts at the trochanter and goes down the hips  ?Pain description: aching  ?Aggravating factors: Standing/ walking/ sitting  ?Relieving factors: lay down and get on a heating pad and find the right position  ? ?Yes: NPRS scale: 7/10 8/10  ?Pain location: same as the right  ?Pain description: aching  ?Aggravating  factors: standing and walking  ?Relieving factors: rest  ? ? ? ?PRECAUTIONS: None ? ?WEIGHT BEARING RESTRICTIONS No ? ?FALLS:  ?Has patient fallen in last 6 months? Yes. Number of falls was having frequent falls. It was found to be medication related.  ? ?LIVING ENVIRONMENT: ?Steps to the attic ? ?OCCUPATION:  ?Retired  ? ?Hobbies: gardening  ? ?PLOF: Independent ? ?PATIENT GOALS   ? ?To have less pain. To improve her ability to perform activity.  ? ? ?OBJECTIVE:  ? ?DIAGNOSTIC FINDINGS:  ? ?PATIENT SURVEYS:  ?FOTO   ? ?COGNITION: ? Overall cognitive status: Within functional limits for tasks assessed   ?  ?SENSATION: ?WFL ? ? ? ?POSTURE:  ?Mild rounding of shoulders and forward head  ? ?PALPATION: ?Significant tenderness to palpation in the lateral hips  ? ?LE ROM: ? ?MMT  Right ?12/27/2021 Left ?12/27/2021  ?Hip flexion 6.1 8.3  ?Hip extension    ?Hip abduction 6.6 9.6  ?Hip adduction    ?Hip internal rotation    ?Hip external rotation    ?Knee flexion    ?Knee extension 7.7 6.5  ?Ankle dorsiflexion    ?Ankle plantarflexion    ?Ankle inversion    ?Ankle eversion    ? (Blank rows = not tested) ? ?Lumbar flexion 75 degrees with a pull  ?Left rotation: pain on the right  ?Right Rotation pain on the left  ? ?LE MMT: ? ?PROM Right ?12/27/2021 Left ?12/27/2021  ?Hip flexion Pain with intial onset of movement  70 degrees   ?Hip extension    ?Hip abduction    ?Hip adduction    ?Hip internal rotation Significant pain  Significant pain   ?Hip external rotation Significant pain  Significant pain   ?Knee flexion    ?Knee extension    ?Ankle dorsiflexion    ?Ankle plantarflexion    ?Ankle inversion    ?Ankle eversion    ? (Blank rows = not tested) ? ?GAIT: ?Decreased bilateral stride length; decreased hip flexion bilateral  ? ? ?TODAY'S TREATMENT: ? ?Exercises ?- Supine Lower Trunk Rotation 10 reps ?- Supine Heel Slide   10 reps very low range to improve hip flexion  ?- Seated Hamstring Stretch  - 1 x daily - 7 x weekly - 2 sets - 3  reps - 20 sec  hold ? ?Spent time education the patient on low range movements in pain free motion. She was advised if she has increased pain to come back and talk to the therapist and we will adjust and educate.  ? ? ?PATIENT EDUCATION:  ?Education details: reviewed concepts of hurt vs  harm, reviewed central nervous system response to pain; reviewed HEP and symptom management ?Person educated: Patient ?Education method: Explanation, Demonstration, Tactile cues, Verbal cues, and Handouts ?Education comprehension: verbalized understanding, returned demonstration, verbal cues required, tactile cues required, and needs further education ? ? ?HOME EXERCISE PROGRAM: ?Access Code: [email or web address] ?URL: [email or web address] ?Date: 12/27/2021 ?Prepared by: Carolyne Littles ? ?Exercises ?- Supine Lower Trunk Rotation  - 1 x daily - 7 x weekly - 3 sets - 10 reps ?- Supine Heel Slide  - 1 x daily - 7 x weekly - 3 sets - 10 reps ?- Seated Hamstring Stretch  - 1 x daily - 7 x weekly - 2 sets - 3 reps - 20 sec  hold ? ?ASSESSMENT: ? ?CLINICAL IMPRESSION: ?Patient is a 71 y.o. female who was seen today for physical therapy evaluation and treatment for bilateral hip pain. She has significant limitations in strength and motion. Her hip flexion motion is <90 degrees bilateral which is likely effecting her ability to sit comfortably. She has significant weakness in all major muscle groups of her LE. She has tried PT before with mixed results. We will begin with aquatic therapy and work on low amplitude exercises in pain free range.  ? ? ?OBJECTIVE IMPAIRMENTS Abnormal gait, decreased activity tolerance, decreased endurance, difficulty walking, decreased ROM, decreased strength, increased fascial restrictions, increased muscle spasms, and pain.  ? ?ACTIVITY LIMITATIONS cleaning, driving, meal prep, occupation, yard work, and shopping.  ? ?PERSONAL FACTORS L4-L5 fusion 2000; A-fib; chronic pain syndrome; depression ; dystonia ;  fibromyalgia;  are also affecting patient's functional outcome.  ? ? ?REHAB POTENTIAL: Good for goals  ? ?CLINICAL DECISION MAKING: Evolving/moderate complexity long standing history of variable pain with

## 2021-12-27 ENCOUNTER — Encounter (HOSPITAL_BASED_OUTPATIENT_CLINIC_OR_DEPARTMENT_OTHER): Payer: Self-pay | Admitting: Physical Therapy

## 2021-12-31 ENCOUNTER — Encounter (HOSPITAL_BASED_OUTPATIENT_CLINIC_OR_DEPARTMENT_OTHER): Payer: Self-pay | Admitting: Physical Therapy

## 2021-12-31 ENCOUNTER — Ambulatory Visit (HOSPITAL_BASED_OUTPATIENT_CLINIC_OR_DEPARTMENT_OTHER): Payer: Medicare HMO | Admitting: Physical Therapy

## 2021-12-31 DIAGNOSIS — M25651 Stiffness of right hip, not elsewhere classified: Secondary | ICD-10-CM | POA: Diagnosis not present

## 2021-12-31 DIAGNOSIS — M25551 Pain in right hip: Secondary | ICD-10-CM | POA: Diagnosis not present

## 2021-12-31 DIAGNOSIS — M7602 Gluteal tendinitis, left hip: Secondary | ICD-10-CM | POA: Diagnosis not present

## 2021-12-31 DIAGNOSIS — R2689 Other abnormalities of gait and mobility: Secondary | ICD-10-CM | POA: Diagnosis not present

## 2021-12-31 DIAGNOSIS — M25552 Pain in left hip: Secondary | ICD-10-CM | POA: Diagnosis not present

## 2021-12-31 DIAGNOSIS — M7601 Gluteal tendinitis, right hip: Secondary | ICD-10-CM | POA: Diagnosis not present

## 2021-12-31 NOTE — Therapy (Signed)
?OUTPATIENT PHYSICAL THERAPY LOWER EXTREMITY Treatment  ? ? ?Patient Name: Danielle Rocha ?MRN: [id number] ?DOB:1951-03-10, 71 y.o., female ?Today's Date: 12/31/2021 ? ? PT End of Session - 12/31/21 1438   ? ? Visit Number 2   ? Number of Visits 16   ? Date for PT Re-Evaluation 02/21/22   ? Authorization Type [employer] Medicare progress note at visit 10 Kx at visit 15   ? PT Start Time 1345   ? PT Stop Time 1428   ? PT Time Calculation (min) 43 min   ? Activity Tolerance Patient tolerated treatment well   ? Behavior During Therapy [facility] for tasks assessed/performed   ? ?  ?  ? ?  ? ? ?Past Medical History:  ?Diagnosis Date  ? Arthritis   ? DJD, low back, thumb  ? Atrial fibrillation (HCC)   ? Xarelto anticoagulation. Flecainide antiarrythmic   ? Chicken pox   ? Chronic pain syndrome   ? On disability. History of bilateral hip pain, low back pain. Gabapentin 400 BID, percocet 1 tablet daily per prior provider.   ? Colon polyp   ? awaiting records  ? Depression   ? zoloft 100mg , remeron 30mg  per psychiatry. ambien 10mg  per psychiatry to help wtih sleep element.   ? Dystonia   ? described as psychogenic dystonia. Pain and twisting from upper chest and up with triggers "wind, creamy food" on TID ativan per psychiatry previously.   ? Fibromyalgia   ? GERD (gastroesophageal reflux disease)   ? omeprazole OTC  ? Goiter   ? states multiple imaging tests, has had biopsies  ? Hyperlipidemia   ? lovastatin 20mg   ? Stroke [facility])   ? TIA (left side of face and body decreased sensitivity than right face and side)  ? TIA (transient ischemic attack)   ? Vaginal atrophy   ? estrace vaginal cream  ? ?Past Surgical History:  ?Procedure Laterality Date  ? ATRIAL FIBRILLATION ABLATION    ? BIOPSY THYROID    ? fusion l4-l5  09/22/1998  ? LAMINECTOMY    ? L4-L5  ? OTHER SURGICAL HISTORY    ? tennis elbow surgery  ? piriformis release  09/22/1997  ? right hip  ? TONSILLECTOMY AND ADENOIDECTOMY  age 82  ? VAGINAL HYSTERECTOMY  09/23/1991  ? ?Patient  Active Problem List  ? Diagnosis Date Noted  ? Sore in nose 07/30/2021  ? Involuntary movements 02/14/2020  ? Depression with anxiety 02/14/2020  ? Aortic atherosclerosis (HCC) 02/07/2020  ? Delayed gastric emptying 02/07/2020  ? Dystonia   ? Restless legs 07/27/2018  ? Recurrent UTI 10/07/2016  ? Major depression in full remission (HCC) 09/02/2016  ? GERD (gastroesophageal reflux disease) 01/30/2016  ? History of SI/intentional drug overdose 11/22/2015  ? Atrial fibrillation (HCC)   ? Chronic pain syndrome   ? Hyperlipidemia   ? ? ?PCP: 10/09/2016, MD ? ?REFERRING PROVIDER: 14/08/2016, MD ? ?REFERRING DIAG: M67.959 (ICD-10-CM) - Tendinopathy of gluteus medius ? ?THERAPY DIAG:  ?Pain in left hip ? ?Pain in right hip ? ?Stiffness of right hip, not elsewhere classified ? ?Other abnormalities of gait and mobility ? ?ONSET DATE: 1998 was the initial onset  ? ?SUBJECTIVE:  ? ?SUBJECTIVE STATEMENT: ?The patient reports her back has been really sore over the past few days. She isn't sure why. Her hips are also hurting. ? ? ? ?Patient has been having pain in the right hip and now the left hip since 1998. The pain  came on insidiously. She has had an increase in pain over the years. She is to the point now where she is having difficulty with all mobility Sh has improved some over the last few years. She is in a constant pain now at this time. She has pain siting and standing. The worst pain comes when she stands and walks for too long. Of not she has begun walking 1 mile a day 3x a week. It causes her pain but she is able to do it.  ? ?PERTINENT HISTORY: ?L4-L5 fusion 2000; A-fib; chronic pain syndrome; depression ; dystonia ; fibromyalgia;  ? ?PAIN:  ?Are you having pain? Yes: NPRS scale: 7/10 today 8/10 at worst 4/11 pain level not assessed but having about the same pain  ?Pain location: starts at the trochanter and goes down the hips  ?Pain description: aching  ?Aggravating factors: Standing/ walking/ sitting   ?Relieving factors: lay down and get on a heating pad and find the right position  ? ?Yes: NPRS scale: 7/10 8/10 same as above  ?Pain location: same as the right  ?Pain description: aching  ?Aggravating factors: standing and walking  ?Relieving factors: rest  ? ? ? ?PRECAUTIONS: None ? ?WEIGHT BEARING RESTRICTIONS No ? ?FALLS:  ?Has patient fallen in last 6 months? Yes. Number of falls was having frequent falls. It was found to be medication related.  ? ?LIVING ENVIRONMENT: ?Steps to the attic ? ?OCCUPATION:  ?Retired  ? ?Hobbies: gardening  ? ?PLOF: Independent ? ?PATIENT GOALS   ? ?To have less pain. To improve her ability to perform activity.  ? ? ?OBJECTIVE:  ? ?DIAGNOSTIC FINDINGS:  ? ?PATIENT SURVEYS:  ?FOTO   ? ?COGNITION: ? Overall cognitive status: Within functional limits for tasks assessed   ?  ?SENSATION: ?WFL ? ? ? ?POSTURE:  ?Mild rounding of shoulders and forward head  ? ?PALPATION: ?Significant tenderness to palpation in the lateral hips  ? ?LE ROM: ? ?MMT  Right ?12/31/2021 Left ?12/31/2021  ?Hip flexion 6.1 8.3  ?Hip extension    ?Hip abduction 6.6 9.6  ?Hip adduction    ?Hip internal rotation    ?Hip external rotation    ?Knee flexion    ?Knee extension 7.7 6.5  ?Ankle dorsiflexion    ?Ankle plantarflexion    ?Ankle inversion    ?Ankle eversion    ? (Blank rows = not tested) ? ?Lumbar flexion 75 degrees with a pull  ?Left rotation: pain on the right  ?Right Rotation pain on the left  ? ?LE MMT: ? ?PROM Right ?12/31/2021 Left ?12/31/2021  ?Hip flexion Pain with intial onset of movement  70 degrees   ?Hip extension    ?Hip abduction    ?Hip adduction    ?Hip internal rotation Significant pain  Significant pain   ?Hip external rotation Significant pain  Significant pain   ?Knee flexion    ?Knee extension    ?Ankle dorsiflexion    ?Ankle plantarflexion    ?Ankle inversion    ?Ankle eversion    ? (Blank rows = not tested) ? ?GAIT: ?Decreased bilateral stride length; decreased hip flexion bilateral   ? ? ?TODAY'S TREATMENT: ?4/11  ?Pt seen for aquatic therapy today.  Treatment took place in water 3.25-4 ft in depth at the [facility] pool. Temp of water was 91?.  Pt entered/exited the pool via stairs (step through pattern) independently with bilat rail. ? ?Introduction to water. Had patient stand at different levels so she could feel  the bouncy  ? ?Warm up: heel/toe walking x4 laps across pool chest deep side stepping x4 laps from shallow to deep;  ? ?Exercises;  ? ?-Slow march x15 each leg with cuing for amplitude   ?           - board trunk flexion x10 lateral board rotation x10 each way seated 5 sec hold each education on limited motion  ?- LAQ x30 each leg patient reported some pain after ?- standing  ? - push pull with blue noodle 2x10  ?  - push down with noodel  ?- standing heel raise  ? ? ?Pt requires buoyancy for support and to offload joints with strengthening exercises. Viscosity of the water is needed for resistance of strengthening; water current perturbations provides challenge to standing balance unsupported, requiring increased core activation. ? ? ? ?Eval ? ?Exercises ?- Supine Lower Trunk Rotation 10 reps ?- Supine Heel Slide   10 reps very low range to improve hip flexion  ?- Seated Hamstring Stretch  - 1 x daily - 7 x weekly - 2 sets - 3 reps - 20 sec  hold ? ?Spent time education the patient on low range movements in pain free motion. She was advised if she has increased pain to come back and talk to the therapist and we will adjust and educate.  ? ? ?PATIENT EDUCATION:  ?Education details: reviewed concepts of hurt vs harm, reviewed central nervous system response to pain; reviewed HEP and symptom management ?Person educated: Patient ?Education method: Explanation, Demonstration, Tactile cues, Verbal cues, and Handouts ?Education comprehension: verbalized understanding, returned demonstration, verbal cues required, tactile cues required, and needs further education ? ? ?HOME  EXERCISE PROGRAM: ?Access Code: [email or web address] ?URL: [email or web address] ?Date: 12/27/2021 ?Prepared by: Lorayne Bender ? ?Exercises ?- Supine Lower Trunk Rotation  - 1 x daily - 7 x weekly - 3 sets - 1

## 2022-01-02 ENCOUNTER — Ambulatory Visit (HOSPITAL_BASED_OUTPATIENT_CLINIC_OR_DEPARTMENT_OTHER): Payer: Medicare HMO | Admitting: Physical Therapy

## 2022-01-02 DIAGNOSIS — M7602 Gluteal tendinitis, left hip: Secondary | ICD-10-CM | POA: Diagnosis not present

## 2022-01-02 DIAGNOSIS — M25651 Stiffness of right hip, not elsewhere classified: Secondary | ICD-10-CM | POA: Diagnosis not present

## 2022-01-02 DIAGNOSIS — M7601 Gluteal tendinitis, right hip: Secondary | ICD-10-CM | POA: Diagnosis not present

## 2022-01-02 DIAGNOSIS — M25552 Pain in left hip: Secondary | ICD-10-CM | POA: Diagnosis not present

## 2022-01-02 DIAGNOSIS — R2689 Other abnormalities of gait and mobility: Secondary | ICD-10-CM

## 2022-01-02 DIAGNOSIS — M25551 Pain in right hip: Secondary | ICD-10-CM | POA: Diagnosis not present

## 2022-01-03 ENCOUNTER — Encounter: Payer: Self-pay | Admitting: Family Medicine

## 2022-01-03 ENCOUNTER — Encounter (HOSPITAL_BASED_OUTPATIENT_CLINIC_OR_DEPARTMENT_OTHER): Payer: Self-pay | Admitting: Physical Therapy

## 2022-01-03 NOTE — Therapy (Signed)
?OUTPATIENT PHYSICAL THERAPY LOWER EXTREMITY Treatment  ? ? ?Patient Name: Danielle Rocha ?MRN: [id number] ?DOB:[birth year], 71 y.o., female ?Today's Date: 01/03/2022 ? ? PT End of Session - 01/03/22 [id number]   ? ? Visit Number 3   ? Number of Visits 16   ? Date for PT Re-Evaluation 02/21/22   ? Authorization Type A[employer]na Medicare progress note at visit 10 Kx at visit 15   ? PT Start Time 1230   ? PT Stop Time 1312   ? PT Time Calculation (min) 42 min   ? Activity Tolerance Patient tolerated treatment well   ? Behavior During Therapy [facility] for tasks assessed/performed   ? ?  ?  ? ?  ? ? ?Past Medical History:  ?Diagnosis Date  ? Arthritis   ? DJD, low back, thumb  ? Atrial fibrillation ([place])   ? Xarelto anticoagulation. Flecainide antiarrythmic   ? Chicken pox   ? Chronic pain syndrome   ? On disability. History of bilateral hip pain, low back pain. Gabapentin 400 BID, percocet 1 tablet daily per prior provider.   ? Colon polyp   ? awaiting records  ? Depression   ? zoloft 100mg , remeron 30mg  per psychiatry. ambien 10mg  per psychiatry to help wtih sleep element.   ? Dystonia   ? described as psychogenic dystonia. Pain and twisting from upper chest and up with triggers "wind, creamy food" on TID ativan per psychiatry previously.   ? Fibromyalgia   ? GERD (gastroesophageal reflux disease)   ? omeprazole OTC  ? Goiter   ? states multiple imaging tests, has had biopsies  ? Hyperlipidemia   ? lovastatin 20mg   ? Stroke [facility])   ? TIA (left side of face and body decreased sensitivity than right face and side)  ? TIA (transient ischemic attack)   ? Vaginal atrophy   ? estrace vaginal cream  ? ?Past Surgical History:  ?Procedure Laterality Date  ? ATRIAL FIBRILLATION ABLATION    ? BIOPSY THYROID    ? fusion l4-l5  09/22/1998  ? LAMINECTOMY    ? L4-L5  ? OTHER SURGICAL HISTORY    ? tennis elbow surgery  ? piriformis release  09/22/1997  ? right hip  ? TONSILLECTOMY AND ADENOIDECTOMY  age 24  ? VAGINAL HYSTERECTOMY  09/23/1991  ? ?Patient  Active Problem List  ? Diagnosis Date Noted  ? Sore in nose 07/30/2021  ? Involuntary movements 02/14/2020  ? Depression with anxiety 02/14/2020  ? Aortic atherosclerosis ([place]) 02/07/2020  ? Delayed gastric emptying 02/07/2020  ? Dystonia   ? Restless legs 07/27/2018  ? Recurrent UTI 10/07/2016  ? Major depression in full remission ([place]) 09/02/2016  ? GERD (gastroesophageal reflux disease) 01/30/2016  ? History of SI/intentional drug overdose 11/22/2015  ? Atrial fibrillation ([place])   ? Chronic pain syndrome   ? Hyperlipidemia   ? ? ?PCP: Marin Olp, MD ? ?REFERRING PROVIDER: Marin Olp, MD ? ?REFERRING DIAG: M67.959 (ICD-10-CM) - Tendinopathy of gluteus medius ? ?THERAPY DIAG:  ?Pain in left hip ? ?Pain in right hip ? ?Stiffness of right hip, not elsewhere classified ? ?Other abnormalities of gait and mobility ? ?ONSET DATE: 1998 was the initial onset  ? ?SUBJECTIVE:  ? ?SUBJECTIVE STATEMENT: ?The patient reports she was really sore for about a day after her last visit. She reports it is about the same.  ? ? ?Patient has been having pain in the right hip and now the left hip since 1998. The pain came  on insidiously. She has had an increase in pain over the years. She is to the point now where she is having difficulty with all mobility Sh has improved some over the last few years. She is in a constant pain now at this time. She has pain siting and standing. The worst pain comes when she stands and walks for too long. Of not she has begun walking 1 mile a day 3x a week. It causes her pain but she is able to do it.  ? ?PERTINENT HISTORY: ?L4-L5 fusion 2000; A-fib; chronic pain syndrome; depression ; dystonia ; fibromyalgia;  ? ?PAIN:  ?Are you having pain? Yes: NPRS scale: 7/10 today 8/10 at worst 4/14 pain level not assessed but having about the same pain ( not assessing official pain levels on  a regular basis on this time 2nd to pain focus.  ?Pain location: starts at the trochanter and goes down the  hips  ?Pain description: aching  ?Aggravating factors: Standing/ walking/ sitting  ?Relieving factors: lay down and get on a heating pad and find the right position  ? ?Yes: NPRS scale: 7/10 8/10 same as above  ?Pain location: same as the right  ?Pain description: aching  ?Aggravating factors: standing and walking  ?Relieving factors: rest  ? ? ? ?PRECAUTIONS: None ? ?WEIGHT BEARING RESTRICTIONS No ? ?FALLS:  ?Has patient fallen in last 6 months? Yes. Number of falls was having frequent falls. It was found to be medication related.  ? ?LIVING ENVIRONMENT: ?Steps to the attic ? ?OCCUPATION:  ?Retired  ? ?Hobbies: gardening  ? ?PLOF: Independent ? ?PATIENT GOALS   ? ?To have less pain. To improve her ability to perform activity.  ? ? ?OBJECTIVE:  ?  ? ? ?TODAY'S TREATMENT: ?4/12 ? ?4/11  ?Pt seen for aquatic therapy today.  Treatment took place in water 3.25-4 ft in depth at the [facility] pool. Temp of water was 91?.  Pt entered/exited the pool via stairs (step through pattern) independently with bilat rail. ? ?Introduction to water. Had patient stand at different levels so she could feel the bouncy  ? ?Warm up: heel/toe walking x4 laps across pool chest deep side stepping x4 laps from shallow to deep;  ? ?Exercises;  ? ?-Slow march x15 each leg with cuing for amplitude   ?           - board trunk flexion x10 lateral board rotation x10 each way seated 5 sec hold each education on limited motion  ?- LAQ 2x15  no pain this time  ?- standing  ? - push pull with blue noodle 2x10  ?  - push down with noodel x20  ?- noolde push and pul with scap pinch x20  ?- standing heel raise x20  ? ? ?Pt requires buoyancy for support and to offload joints with strengthening exercises. Viscosity of the water is needed for resistance of strengthening; water current perturbations provides challenge to standing balance unsupported, requiring increased core activation. ? ? ? ?Eval ? ?Exercises ?- Supine Lower Trunk Rotation 10  reps ?- Supine Heel Slide   10 reps very low range to improve hip flexion  ?- Seated Hamstring Stretch  - 1 x daily - 7 x weekly - 2 sets - 3 reps - 20 sec  hold ? ?Spent time education the patient on low range movements in pain free motion. She was advised if she has increased pain to come back and talk to the therapist and we will  adjust and educate.  ? ? ?PATIENT EDUCATION:  ?Education details: reviewed concepts of hurt vs harm, reviewed central nervous system response to pain; reviewed HEP and symptom management ?Person educated: Patient ?Education method: Explanation, Demonstration, Tactile cues, Verbal cues, and Handouts ?Education comprehension: verbalized understanding, returned demonstration, verbal cues required, tactile cues required, and needs further education ? ? ?HOME EXERCISE PROGRAM: ?Access Code: [email or web address] ?URL: [email or web address] ?Date: 12/27/2021 ?Prepared by: Carolyne Littles ? ?Exercises ?- Supine Lower Trunk Rotation  - 1 x daily - 7 x weekly - 3 sets - 10 reps ?- Supine Heel Slide  - 1 x daily - 7 x weekly - 3 sets - 10 reps ?- Seated Hamstring Stretch  - 1 x daily - 7 x weekly - 2 sets - 3 reps - 20 sec  hold ? ?ASSESSMENT: ? ?CLINICAL IMPRESSION: ?The [atient was able to go through more range with her hip flexion. She continues to have sensitivity even when she sits. Therapy reviewed relaxation concepts. We will continue to work on tying breathing and relaxation to movement. We kept her exercises consistent today to build some habituation to the activity. We will continue to progress as tolerated.  ? ? ?OBJECTIVE IMPAIRMENTS Abnormal gait, decreased activity tolerance, decreased endurance, difficulty walking, decreased ROM, decreased strength, increased fascial restrictions, increased muscle spasms, and pain.  ? ?ACTIVITY LIMITATIONS cleaning, driving, meal prep, occupation, yard work, and shopping.  ? ?PERSONAL FACTORS L4-L5 fusion 2000; A-fib; chronic pain syndrome;  depression ; dystonia ; fibromyalgia;  are also affecting patient's functional outcome.  ? ? ?REHAB POTENTIAL: Good for goals  ? ?CLINICAL DECISION MAKING: Evolving/moderate complexity long standing history of vari

## 2022-01-05 ENCOUNTER — Emergency Department (HOSPITAL_COMMUNITY): Payer: Medicare HMO

## 2022-01-05 ENCOUNTER — Emergency Department (HOSPITAL_COMMUNITY)
Admission: EM | Admit: 2022-01-05 | Discharge: 2022-01-05 | Disposition: A | Discharge status: Home / Self Care | Payer: Medicare HMO | Attending: Emergency Medicine | Admitting: Emergency Medicine

## 2022-01-05 ENCOUNTER — Encounter (HOSPITAL_COMMUNITY): Payer: Self-pay

## 2022-01-05 DIAGNOSIS — Z7901 Long term (current) use of anticoagulants: Secondary | ICD-10-CM | POA: Diagnosis not present

## 2022-01-05 DIAGNOSIS — G43809 Other migraine, not intractable, without status migrainosus: Secondary | ICD-10-CM | POA: Diagnosis not present

## 2022-01-05 DIAGNOSIS — G44209 Tension-type headache, unspecified, not intractable: Secondary | ICD-10-CM | POA: Diagnosis not present

## 2022-01-05 DIAGNOSIS — R519 Headache, unspecified: Secondary | ICD-10-CM | POA: Diagnosis not present

## 2022-01-05 LAB — CBC WITH DIFFERENTIAL/PLATELET
Abs Immature Granulocytes: 0.03 10*3/uL (ref 0.00–0.07)
Basophils Absolute: 0.1 10*3/uL (ref 0.0–0.1)
Basophils Relative: 1 %
Eosinophils Absolute: 0.1 10*3/uL (ref 0.0–0.5)
Eosinophils Relative: 1 %
HCT: 44.2 % (ref 36.0–46.0)
Hemoglobin: 14.7 g/dL (ref 12.0–15.0)
Immature Granulocytes: 1 %
Lymphocytes Relative: 29 %
Lymphs Abs: 1.8 10*3/uL (ref 0.7–4.0)
MCH: 28.5 pg (ref 26.0–34.0)
MCHC: 33.3 g/dL (ref 30.0–36.0)
MCV: 85.8 fL (ref 80.0–100.0)
Monocytes Absolute: 0.7 10*3/uL (ref 0.1–1.0)
Monocytes Relative: 12 %
Neutro Abs: 3.6 10*3/uL (ref 1.7–7.7)
Neutrophils Relative %: 56 %
Platelets: 233 10*3/uL (ref 150–400)
RBC: 5.15 MIL/uL — ABNORMAL HIGH (ref 3.87–5.11)
RDW: 13 % (ref 11.5–15.5)
WBC: 6.3 10*3/uL (ref 4.0–10.5)
nRBC: 0 % (ref 0.0–0.2)

## 2022-01-05 LAB — BASIC METABOLIC PANEL
Anion gap: 3 — ABNORMAL LOW (ref 5–15)
BUN: 13 mg/dL (ref 8–23)
CO2: 28 mmol/L (ref 22–32)
Calcium: 9.1 mg/dL (ref 8.9–10.3)
Chloride: 110 mmol/L (ref 98–111)
Creatinine, Ser: 0.8 mg/dL (ref 0.44–1.00)
GFR, Estimated: >60 mL/min (ref >60)
Glucose, Bld: 102 mg/dL — ABNORMAL HIGH (ref 70–99)
Potassium: 3.7 mmol/L (ref 3.5–5.1)
Sodium: 141 mmol/L (ref 135–145)

## 2022-01-05 MED ORDER — SUMATRIPTAN SUCCINATE 100 MG PO TABS: 100.0000 mg | ORAL_TABLET | Freq: Once | ORAL | 0 refills | Status: DC | PRN

## 2022-01-05 MED ORDER — KETOROLAC TROMETHAMINE 15 MG/ML IJ SOLN
15.0000 mg | Freq: Once | INTRAMUSCULAR | Status: AC
  Administered 2022-01-05: 15 mg via INTRAVENOUS
  Filled 2022-01-05: qty 1

## 2022-01-05 MED ORDER — DEXAMETHASONE SODIUM PHOSPHATE 10 MG/ML IJ SOLN
10.0000 mg | Freq: Once | INTRAMUSCULAR | Status: AC
  Administered 2022-01-05: 10 mg via INTRAVENOUS
  Filled 2022-01-05: qty 1

## 2022-01-05 MED ORDER — SODIUM CHLORIDE 0.9 % IV BOLUS
1000.0000 mL | Freq: Once | INTRAVENOUS | Status: AC
  Administered 2022-01-05: 1000 mL via INTRAVENOUS

## 2022-01-05 MED ORDER — DIPHENHYDRAMINE HCL 50 MG/ML IJ SOLN
25.0000 mg | Freq: Once | INTRAMUSCULAR | Status: AC
  Administered 2022-01-05: 25 mg via INTRAVENOUS
  Filled 2022-01-05: qty 1

## 2022-01-05 MED ORDER — PROCHLORPERAZINE EDISYLATE 10 MG/2ML IJ SOLN
10.0000 mg | Freq: Once | INTRAMUSCULAR | Status: AC
  Administered 2022-01-05: 10 mg via INTRAVENOUS
  Filled 2022-01-05: qty 2

## 2022-01-05 NOTE — ED Provider Triage Note (Signed)
Emergency Medicine Provider Triage Evaluation Note ? ?Danielle Rocha , a 71 y.o. female  was evaluated in triage.  Pt complains of HA. Hx of bad headaches since December. Has seen PCP. + poor sleep. PCP felt if sleep was fixed, HA would go away. Taking Trazadone. Sleep improved, headaches have not. Previous 2-3 times a wek, Now daily since Thursday. Used tylenol without relief and took caffeine. She does not take nsaids due to anticoag on xarelto for TIA. + light sensitivity. Nausea, pain with eye movment. Laying flat. No vision changes.+ hypreresthesia. ? ?Review of Systems  ?Positive: headache ?Negative: vomiting ? ?Physical Exam  ?BP (!) 105/57 (BP Location: Right Arm)   Pulse 73   Temp 99 ?F (37.2 ?C) (Oral)   Resp 18   LMP  (LMP Unknown)   SpO2 95%  ?Gen:   Awake, no distress   ?Resp:  Normal effort  ?MSK:   Moves extremities without difficulty  ?Other:  No obvious neuro deficits. ? ?Medical Decision Making  ?Medically screening exam initiated at 9:23 AM.  Appropriate orders placed.  Tevin Bennis was informed that the remainder of the evaluation will be completed by another provider, this initial triage assessment does not replace that evaluation, and the importance of remaining in the ED until their evaluation is complete. ? ?Appropriate orders placed ?  ?Margarita Mail, PA-C ?01/05/22 [id number] ? ?

## 2022-01-05 NOTE — ED Notes (Signed)
Patient transported to MRI 

## 2022-01-05 NOTE — ED Provider Notes (Signed)
?[facility] DEPT ?Provider Note ? ? ?CSN: [id number] ?Arrival date & time: 01/05/22  0846 ? ?  ? ?History ? ?Chief Complaint  ?Patient presents with  ? Headache  ? ? ?Danielle Rocha is a 71 y.o. female with a past medical history significant for A-fib, chronic pain, acid reflux, recurrent UTI, depression who presents with concern for severe headache with photophobia, nausea which have been approximately once a week since December, last for a few days at a time.  Patient reports that she is not sleeping well getting 2 to 3 hours of sleep at a time.  She had seen her PCP who had tried a low-dose of trazodone to help her with her sleep but reported that she should go to the emergency department she continues to have problems with severe headache, difficulty sleeping.  She denies any numbness, tingling, facial droop, previous stroke, she reports that she had an isolated TIA several years ago with no additional deficits.  She is currently anticoagulated on Xarelto. ? ? ?Headache ? ?  ? ?Home Medications ?Prior to Admission medications   ?Medication Sig Start Date End Date Taking? Authorizing Provider  ?SUMAtriptan (IMITREX) 100 MG tablet Take 1 tablet (100 mg total) by mouth once as needed for up to 1 dose for migraine. 01/05/22  Yes Jasiyah Paulding H, PA-C  ?acetaminophen (TYLENOL) 500 MG tablet Take 1,000 mg by mouth every 6 (six) hours as needed for mild pain, moderate pain, fever or headache.    [provider]  ?buPROPion (WELLBUTRIN XL) 150 MG 24 hr tablet Take 150 mg by mouth daily. Per psychiatry    [provider]  ?cephALEXin (KEFLEX) 250 MG capsule Take 250 mg by mouth daily. Takes daily for frequent UTIs. 10/25/19   [provider]  ?cholecalciferol (VITAMIN D) 1000 units tablet Take 1,000 Units by mouth daily.    [provider]  ?co-enzyme Q-10 30 MG capsule Take 30 mg by mouth daily.    [provider]  ?Cyanocobalamin (B-12 PO)  Take by mouth.    [provider]  ?FERROUS SULFATE PO Take by mouth.    [provider]  ?flecainide (TAMBOCOR) 100 MG tablet Take 1 tablet (100 mg total) by mouth 2 (two) times daily. 06/06/21   Marin Olp, MD  ?Melatonin 3 MG TABS Take 2 tablets by mouth at bedtime.     [provider]  ?Multiple Vitamin (MULTIVITAMIN WITH MINERALS) TABS tablet Take 1 tablet by mouth daily.    [provider]  ?ondansetron (ZOFRAN ODT) 4 MG disintegrating tablet Take 1 tablet (4 mg total) by mouth every 8 (eight) hours as needed for nausea or vomiting. 03/20/21   Gareth Morgan, MD  ?pantoprazole (PROTONIX) 20 MG tablet Take 1 tablet (20 mg total) by mouth 2 (two) times daily before a meal. Take 2 tablets daily. 04/19/21   Esterwood, Amy S, PA-C  ?rivaroxaban (XARELTO) 20 MG TABS tablet Take 1 tablet (20 mg total) by mouth daily with supper. 06/06/21   Marin Olp, MD  ?rosuvastatin (CRESTOR) 20 MG tablet Take 1 tablet (20 mg total) by mouth daily. 06/07/21   Marin Olp, MD  ?sertraline (ZOLOFT) 50 MG tablet Take 1 tablet (50 mg total) by mouth daily. 02/14/20   Marcial Pacas, MD  ?traZODone (DESYREL) 50 MG tablet Take 0.5-1 tablets (25-50 mg total) by mouth at bedtime as needed for sleep. 12/23/21   Marin Olp, MD  ?   ? ?Allergies    ?  Reglan [metoclopramide], Ciprofloxacin, Clindamycin/lincomycin, Doxycycline, Macrobid [nitrofurantoin monohyd macro], and Sulfa antibiotics   ? ?Review of Systems   ?Review of Systems  ?Neurological:  Positive for headaches.  ?All other systems reviewed and are negative. ? ?Physical Exam ?Updated Vital Signs ?BP (!) 126/56   Pulse 66   Temp 98.2 ?F (36.8 ?C) (Oral)   Resp 16   Ht 5\' 8"  (1.727 m)   Wt 68.6 kg   LMP  (LMP Unknown)   SpO2 96%   BMI 22.99 kg/m?  ?Physical Exam ?Vitals and nursing note reviewed.  ?Constitutional:   ?   General: She is not in acute distress. ?   Appearance: Normal appearance.  ?HENT:  ?   Head: Normocephalic  and atraumatic.  ?Eyes:  ?   General:     ?   Right eye: No discharge.     ?   Left eye: No discharge.  ?Cardiovascular:  ?   Rate and Rhythm: Normal rate and regular rhythm.  ?   Heart sounds: No murmur heard. ?  No friction rub. No gallop.  ?Pulmonary:  ?   Effort: Pulmonary effort is normal.  ?   Breath sounds: Normal breath sounds.  ?Abdominal:  ?   General: Bowel sounds are normal.  ?   Palpations: Abdomen is soft.  ?Skin: ?   General: Skin is warm and dry.  ?   Capillary Refill: Capillary refill takes less than 2 seconds.  ?Neurological:  ?   Mental Status: She is alert and oriented to person, place, and time.  ?   Comments: Cranial nerves II through XII grossly intact.  Intact finger-nose, intact heel-to-shin.  Romberg negative, gait normal.  Alert and oriented x3.  Moves all 4 limbs spontaneously, normal coordination. Intact strength 5 out of 5 bilateral upper and lower extremities. ? ?Patient initially with some right-sided pronator drift, no pronator drift noted on repeat assessment.  ?Psychiatric:     ?   Mood and Affect: Mood normal.     ?   Behavior: Behavior normal.  ? ? ?ED Results / Procedures / Treatments   ?Labs ?(all labs ordered are listed, but only abnormal results are displayed) ?Labs Reviewed  ?CBC WITH DIFFERENTIAL/PLATELET - Abnormal; Notable for the following components:  ?    Result Value  ? RBC 5.15 (*)   ? All other components within normal limits  ?BASIC METABOLIC PANEL - Abnormal; Notable for the following components:  ? Glucose, Bld 102 (*)   ? Anion gap 3 (*)   ? All other components within normal limits  ? ? ?EKG ?None ? ?Radiology ?CT HEAD WO CONTRAST ? ?Result Date: 01/05/2022 ?CLINICAL DATA:  Headache for 4 days.  Photophobia. EXAM: CT HEAD WITHOUT CONTRAST TECHNIQUE: Contiguous axial images were obtained from the base of the skull through the vertex without intravenous contrast. RADIATION DOSE REDUCTION: This exam was performed according to the departmental dose-optimization  program which includes automated exposure control, adjustment of the mA and/or kV according to patient size and/or use of iterative reconstruction technique. COMPARISON:  06/06/2021 FINDINGS: Brain: No evidence of intracranial hemorrhage, acute infarction, hydrocephalus, extra-axial collection, or mass lesion/mass effect. Vascular:  No hyperdense vessel or other acute findings. Skull: No evidence of fracture or other significant bone abnormality. Sinuses/Orbits:  No acute findings. Other: None. IMPRESSION: Negative noncontrast head CT. Electronically Signed   By: Marlaine Hind M.D.   On: 01/05/2022 10:20  ? ?MR BRAIN WO CONTRAST ? ?Result Date: 01/05/2022 ?CLINICAL  DATA:  Headache, chronic, new features or increased frequency. EXAM: MRI HEAD WITHOUT CONTRAST TECHNIQUE: Multiplanar, multiecho pulse sequences of the brain and surrounding structures were obtained without intravenous contrast. COMPARISON:  CT head without contrast 01/05/2022. MRI of the head without contrast 06/06/2021 FINDINGS: Brain: No acute infarct, hemorrhage, or mass lesion is present. No significant white matter lesions are present. The ventricles are of normal size. No significant extraaxial fluid collection is present. The internal auditory canals are within normal limits. The brainstem and cerebellum are within normal limits. Vascular: Flow is present in the major intracranial arteries. Skull and upper cervical spine: The craniocervical junction is normal. Upper cervical spine is within normal limits. Marrow signal is unremarkable. Sinuses/Orbits: The paranasal sinuses and mastoid air cells are clear. The globes and orbits are within normal limits. IMPRESSION: Negative MRI of the brain. Electronically Signed   By: San Morelle M.D.   On: 01/05/2022 15:32   ? ?Procedures ?Procedures  ? ? ?Medications Ordered in ED ?Medications  ?sodium chloride 0.9 % bolus 1,000 mL (1,000 mLs Intravenous New Bag/Given 01/05/22 1232)  ?prochlorperazine  (COMPAZINE) injection 10 mg (10 mg Intravenous Given 01/05/22 1223)  ?diphenhydrAMINE (BENADRYL) injection 25 mg (25 mg Intravenous Given 01/05/22 1223)  ?ketorolac (TORADOL) 15 MG/ML injection 15 mg (15 mg Intravenous G

## 2022-01-05 NOTE — Discharge Instructions (Addendum)
Please use Tylenol or ibuprofen for pain.  You may use 600 mg ibuprofen every 6 hours or 1000 mg of Tylenol every 6 hours.  You may choose to alternate between the 2.  This would be most effective.  Not to exceed 4 g of Tylenol within 24 hours.  Not to exceed 3200 mg ibuprofen 24 hours. ? ?You can use the imitrex medication I am prescribing up to 10 days in the month as needed for migraines. Please follow up with your PCP in the next week for further evaluation. ?

## 2022-01-05 NOTE — ED Triage Notes (Signed)
Pt presents with c/o headache since Thursday. Pt reports she has been having headaches approx one time a week since December and has not been sleeping as well. Pt reports she did follow up with her MD and he prescribed her meds for sleeping and was planning on doing a scan of her head if that did not work. ?

## 2022-01-06 ENCOUNTER — Telehealth: Payer: Self-pay | Admitting: Family Medicine

## 2022-01-06 ENCOUNTER — Ambulatory Visit (HOSPITAL_BASED_OUTPATIENT_CLINIC_OR_DEPARTMENT_OTHER): Payer: Medicare HMO | Admitting: Physical Therapy

## 2022-01-06 ENCOUNTER — Encounter (HOSPITAL_BASED_OUTPATIENT_CLINIC_OR_DEPARTMENT_OTHER): Payer: Self-pay | Admitting: Physical Therapy

## 2022-01-06 DIAGNOSIS — R2689 Other abnormalities of gait and mobility: Secondary | ICD-10-CM | POA: Diagnosis not present

## 2022-01-06 DIAGNOSIS — R040 Epistaxis: Secondary | ICD-10-CM

## 2022-01-06 DIAGNOSIS — M25552 Pain in left hip: Secondary | ICD-10-CM | POA: Diagnosis not present

## 2022-01-06 DIAGNOSIS — M25551 Pain in right hip: Secondary | ICD-10-CM

## 2022-01-06 DIAGNOSIS — M25651 Stiffness of right hip, not elsewhere classified: Secondary | ICD-10-CM

## 2022-01-06 DIAGNOSIS — M7602 Gluteal tendinitis, left hip: Secondary | ICD-10-CM | POA: Diagnosis not present

## 2022-01-06 DIAGNOSIS — M7601 Gluteal tendinitis, right hip: Secondary | ICD-10-CM | POA: Diagnosis not present

## 2022-01-06 NOTE — Telephone Encounter (Signed)
FYI ? ?Discharge summary: ?Discussed trail of imitrex for headache abortive and recommend follow up with PCP, possible referral to neurology if headaches persist ?

## 2022-01-06 NOTE — Therapy (Signed)
?OUTPATIENT PHYSICAL THERAPY LOWER EXTREMITY Treatment  ? ? ?Patient Name: Danielle Rocha ?MRN: [id number] ?DOB:1950/10/12, 71 y.o., female ?Today's Date: 01/06/2022 ? ? PT End of Session - 01/06/22 0937   ? ? Visit Number 4   ? Number of Visits 16   ? Date for PT Re-Evaluation 02/21/22   ? Authorization Type [employer] progress note at visit 10 Kx at visit 15   ? PT Start Time [phone]   ? PT Stop Time 1020   ? PT Time Calculation (min) 44 min   ? Activity Tolerance Patient tolerated treatment well   ? ?  ?  ? ?  ? ? ?Past Medical History:  ?Diagnosis Date  ? Arthritis   ? DJD, low back, thumb  ? Atrial fibrillation ([place])   ? Xarelto anticoagulation. Flecainide antiarrythmic   ? Chicken pox   ? Chronic pain syndrome   ? On disability. History of bilateral hip pain, low back pain. Gabapentin 400 BID, percocet 1 tablet daily per prior provider.   ? Colon polyp   ? awaiting records  ? Depression   ? zoloft 100mg , remeron 30mg  per psychiatry. ambien 10mg  per psychiatry to help wtih sleep element.   ? Dystonia   ? described as psychogenic dystonia. Pain and twisting from upper chest and up with triggers "wind, creamy food" on TID ativan per psychiatry previously.   ? Fibromyalgia   ? GERD (gastroesophageal reflux disease)   ? omeprazole OTC  ? Goiter   ? states multiple imaging tests, has had biopsies  ? Hyperlipidemia   ? lovastatin 20mg   ? Stroke [facility])   ? TIA (left side of face and body decreased sensitivity than right face and side)  ? TIA (transient ischemic attack)   ? Vaginal atrophy   ? estrace vaginal cream  ? ?Past Surgical History:  ?Procedure Laterality Date  ? ATRIAL FIBRILLATION ABLATION    ? BIOPSY THYROID    ? fusion l4-l5  09/22/1998  ? LAMINECTOMY    ? L4-L5  ? OTHER SURGICAL HISTORY    ? tennis elbow surgery  ? piriformis release  09/22/1997  ? right hip  ? TONSILLECTOMY AND ADENOIDECTOMY  age 66  ? VAGINAL HYSTERECTOMY  09/23/1991  ? ?Patient Active Problem List  ? Diagnosis Date Noted  ? Sore in nose  07/30/2021  ? Involuntary movements 02/14/2020  ? Depression with anxiety 02/14/2020  ? Aortic atherosclerosis ([place]) 02/07/2020  ? Delayed gastric emptying 02/07/2020  ? Dystonia   ? Restless legs 07/27/2018  ? Recurrent UTI 10/07/2016  ? Major depression in full remission ([place]) 09/02/2016  ? GERD (gastroesophageal reflux disease) 01/30/2016  ? History of SI/intentional drug overdose 11/22/2015  ? Atrial fibrillation ([place])   ? Chronic pain syndrome   ? Hyperlipidemia   ? ? ?PCP: Marin Olp, MD ? ?REFERRING PROVIDER: Marin Olp, MD ? ?REFERRING DIAG: M67.959 (ICD-10-CM) - Tendinopathy of gluteus medius ? ?THERAPY DIAG:  ?Pain in left hip ? ?Pain in right hip ? ?Stiffness of right hip, not elsewhere classified ? ?Other abnormalities of gait and mobility ? ?ONSET DATE: 1998 was the initial onset  ? ?SUBJECTIVE:  ? ?SUBJECTIVE STATEMENT: ?Pt reports she feels about the same as last week.  She reports the 48 hrs after PT is tricky.   ? ?PERTINENT HISTORY: ?L4-L5 fusion 2000; A-fib; chronic pain syndrome; depression ; dystonia ; fibromyalgia;  ? ?PAIN:  ?Are you having pain? Yes ?NPRS scale:  ( not assessing official pain  levels on  a regular basis on this time 2nd to pain focus.)  ?Pain location: starts at the trochanter and goes down the hips  ?Pain description: aching  ?Aggravating factors: Standing/ walking/ sitting  ?Relieving factors: lay down and get on a heating pad and find the right position  ? ?PRECAUTIONS: None ? ?WEIGHT BEARING RESTRICTIONS No ? ?FALLS:  ?Has patient fallen in last 6 months? Yes. Number of falls was having frequent falls. It was found to be medication related.  ? ?LIVING ENVIRONMENT: ?Steps to the attic ? ?OCCUPATION:  ?Retired  ? ?Hobbies: gardening  ? ?PLOF: Independent ? ?PATIENT GOALS   ? ?To have less pain. To improve her ability to perform activity.  ? ? ?OBJECTIVE:  ?  ? ? ?TODAY'S TREATMENT: ?Pt seen for aquatic therapy today.  Treatment took place in water 3.25-4.2 ft  in depth at the [employer] pool. Temp of water was 91?.  Pt entered/exited the pool via stairs (step through pattern) independently with bilat rail. ? ?Warm up: forward / backward walking x 3 laps each; side stepping 3 laps  ?Slow high knee marching forward/ backward holding yellow noodle  ?Holding wall: Hip openers (hip abdct/knee flexion) x 10, hip flexion/knee flexion crossing midline x 10 - range to tolerance ?Hip abdct/ add x 5 reps, 2 sets;  Hip ext x 5 reps, 2 sets ?Heel raises x 20 ? ?Ai Chi: in staggered stance or wide stance, 10 reps each posture, coordinating breath with arm motion:   ?floating, uplifting, enclosing  ? ? Single forward walking laps in between exercises to decrease stiffness from standing postures. ?Supported by yellow noodle under arms in seated position x 3 min for lumbar pain relief ? ?Self care: pt educated on self massage with ball to hip and roller stick to LEs. Demo provided. Pt verbalized understanding and returned demo with ball. Discussed using lumbar support in car/ chairs to help maintain lumbar curve.  ? ?Pt requires buoyancy for support and to offload joints with strengthening exercises. Viscosity of the water is needed for resistance of strengthening; water current perturbations provides challenge to standing balance unsupported, requiring increased core activation. ? ? ?PATIENT EDUCATION:  ?Education details: reviewed central nervous system response to pain; self care massage; posture/ lumbar support ?Person educated: Patient ?Education method: Explanation, Demonstration, Tactile cues, Verbal cues ?Education comprehension: verbalized understanding, returned demonstration, verbal cues required, tactile cues required, and needs further education ? ? ?HOME EXERCISE PROGRAM: ?Access Code: [email or web address] ?URL: [email or web address] ?Date: 12/27/2021 ?Prepared by: Carolyne Littles ? ?Exercises ?- Supine Lower Trunk Rotation  - 1 x daily - 7 x weekly - 3 sets - 10  reps ?- Supine Heel Slide  - 1 x daily - 7 x weekly - 3 sets - 10 reps ?- Seated Hamstring Stretch  - 1 x daily - 7 x weekly - 2 sets - 3 reps - 20 sec  hold ? ?ASSESSMENT: ? ?CLINICAL IMPRESSION: ?Pt was able to complete exercises in water without increase in pain.  She reported some increase in low back tightness when completing Ai Chi postures; relieved with decompressing low back while suspending under arms with noodle. She liked the coordinating breath work with the arms for relaxation. Will continue to progress as tolerated.  ? ? ?OBJECTIVE IMPAIRMENTS Abnormal gait, decreased activity tolerance, decreased endurance, difficulty walking, decreased ROM, decreased strength, increased fascial restrictions, increased muscle spasms, and pain.  ? ?ACTIVITY LIMITATIONS cleaning, driving, meal prep, occupation, yard work, and shopping.  ? ?  PERSONAL FACTORS L4-L5 fusion 2000; A-fib; chronic pain syndrome; depression ; dystonia ; fibromyalgia;  are also affecting patient's functional outcome.  ? ? ?REHAB POTENTIAL: Good for goals  ? ?CLINICAL DECISION MAKING: Evolving/moderate complexity long standing history of variable pain with exercises and activity  ? ?EVALUATION COMPLEXITY: Moderate ? ? ?GOALS: ?Goals reviewed with patient? Yes ? ?SHORT TERM GOALS: Target date: 01/27/2022 ? ?Patient will increase lumbar flexion by 15 degrees  ?Baseline: ?Goal status: INITIAL ? ?2.  Patient will increase passive hip flexion to 90 degrees without pain  ?Baseline:  ?Goal status: INITIAL ? ?3.  Patient will increase gross bilateral LE strength by 5 lbs bilateral  ?Baseline:  ?Goal status: INITIAL ? ?LONG TERM GOALS: Target date: 02/17/2022 ? ?Patient will be independent with a complete exercise program including pool and land exercises ?Baseline:  ?Goal status: INITIAL ? ?2.  Patient will go up and down 5 steps without pain in order to improve mobility in the community  ?Baseline:  ?Goal status: INITIAL ? ?3.  Patient will garden with  <4/10 pain  ?Baseline:  ?Goal status: INITIAL ? ? ? ?PLAN: ?PT FREQUENCY: 1-2x/week ? ?PT DURATION: 8 weeks ? ?PLANNED INTERVENTIONS: Therapeutic exercises, Therapeutic activity, Neuromuscular re-education,

## 2022-01-06 NOTE — Telephone Encounter (Signed)
Pt was seen in [facility] on 01/05/22 ? ?Patient ?Name: ?Danielle SE ?Rocha ?Gender: Female ?DOB: 1951-04-30 ?Age: 71 Y 6 M 12 D ?Return ?Phone ?Number: ?[id number] ?(Primary) ?Address: ?City/ ?State/ ?Zip: ?[place] ? [address] ?Client [facility] - ?Clie ?Presenter, broadcasting at [place] [facility] ?Provider Garret Reddish- MD ?Contact Type Call ?Who Is Calling Patient / Member / Family / Caregiver ?Call Type Triage / Clinical ?Relationship To Patient Self ?Return Phone Number [phone] (Primary) ?Chief Complaint Headache ?Reason for Call Symptomatic / Request for Health Information ?Initial Comment Caller states she is having headaches. The extra ?strength Tylenol is not helping, she is wondering ?if she can take Excedrin. She is currently taking a ?blood thinner. ?Translation No ?Nurse Assessment ?Nurse: Delphina Cahill, RN, Santiago Glad Date/Time Eilene Ghazi Time): 01/04/2022 9:09:47 AM ?Confirm and document reason for call. If ?symptomatic, describe symptoms. ?---Caller states she is having headaches. The extra ?strength Tylenol is not helping, she is wondering if ?she can take Excedrin. She is currently taking a blood ?thinner. Pain today is 2/10, yesterday 10/10. Woke ?up at 0200 and was awake for two hours due to pain. ?Has had HAs like this before and saw MD. Has been ?sleeping poorly and thought it was the cause of the ?HAs, started trazadone and is sleeping better but HAs ?persist. ?Does the patient have any new or worsening ?symptoms? ---Yes ?Will a triage be completed? ---Yes ?Related visit to physician within the last 2 weeks? ---No ?Does the PT have any chronic conditions? (i.e. ?diabetes, asthma, this includes High risk factors for ?pregnancy, etc.) ?---Yes ?List chronic conditions. ---Many, s/p TIA years ago, bilateral hip pain-bursitis ?and tendonitis, depression, GERD ?Is this a behavioral health or substance abuse call? ---No ?Guidelines ?Guideline Title Affirmed Question  Affirmed Notes Nurse Date/Time (Eastern ?Time) ?Headache [1] MODERATE ?headache (e.g., ?interferes with ?normal activities) ?AND [2] present ?> 24 hours AND ?[3] unexplained ?(Exceptions: ?analgesics not tried, ?typical migraine, or ?headache part of viral ?illness) ?Delphina Cahill, RN, Santiago Glad 01/04/2022 9:13:22 ?AM ?Disp. Time (Eastern ?Time) Disposition Final User ?01/04/2022 9:21:44 AM See PCP within 24 Hours Yes Delphina Cahill, RN, Santiago Glad ?Caller Disagree/Comply Comply ?Caller Understands Yes ?PreDisposition Did not know what to do ?Care Advice Given Per Guideline ?SEE PCP WITHIN 24 HOURS: * IF OFFICE WILL BE CLOSED: You need to be seen within the next 24 hours. A clinic ?or an urgent care center is often a good source of care if your doctor's office is closed or you can't get an appointment. PAIN ?MEDICINES: * They are over-the-counter (OTC) pain drugs. You can buy them at the drugstore. * ACETAMINOPHEN - ?REGULAR STRENGTH TYLENOL: Take 650 mg (two 325 mg pills) by mouth every 4 to 6 hours as needed. Each Regular ?Strength Tylenol pill has 325 mg of acetaminophen. The most you should take is 10 pills a day (3,250 mg total). Note: In [place], the ?maximum is 12 pills a day (3,900 mg total). * ACETAMINOPHEN - EXTRA STRENGTH TYLENOL: Take 1,000 mg (two 500 ?mg pills) every 6 to 8 hours as needed. Each Extra Strength Tylenol pill has 500 mg of acetaminophen. The most you should take ?is 6 pills a day (3,000 mg total). Note: In [place], the maximum is 8 pills a day (4,000 mg total). REST: * Lie down in a dark quiet ?place and relax until feeling better. LOCAL COLD: * Apply a cold wet washcloth or cold pack to the forehead for 20 minutes. CALL ?BACK IF: * You  become worse CARE ADVICE given per Headache (Adult) guideline ?

## 2022-01-07 NOTE — Telephone Encounter (Signed)
Has she tried the imitrex that they sent in? If not please have her try that ? ?Can also refer to neurology under persistent headache ?

## 2022-01-08 NOTE — Telephone Encounter (Signed)
Spoke to pt asked her if she has tried Imitrex for her headaches? She said she took one last night and it did help. Told her Dr. Yong Channel said can place referral to Neurology for persistent headache. Pt said that would be fine but she can just call them. She saw Benay Pillow in the past and really liked her and does not need a referral has seen her in the past 6 months. Told her okay. Pt said she does need a referral to ENT for nosebleeds was in the ED on 3/30 due to having 8 nosebleeds and had 4 last week. Asked her if she discussed this with Dr.Hunter? She said yes. Told her okay I will send Dr. Yong Channel a message about nose bleeds and place referral for you. Pt verbalized understanding. Referral placed in Epic for ENT. ?

## 2022-01-08 NOTE — Telephone Encounter (Signed)
Dr. Yong Channel, spoke to pt she did try Imitrex last night and it helped. She already sees Sherrilyn Rist at Neurology and she will contact them to schedule an appt. Also she is having nosebleeds and wanted a referral to ENT. I have placed the referral.  ?

## 2022-01-08 NOTE — Telephone Encounter (Signed)
THanks so much- great job Lupita Leash ?

## 2022-01-09 ENCOUNTER — Encounter (HOSPITAL_BASED_OUTPATIENT_CLINIC_OR_DEPARTMENT_OTHER): Payer: Self-pay | Admitting: Physical Therapy

## 2022-01-09 ENCOUNTER — Telehealth: Payer: Self-pay | Admitting: Family Medicine

## 2022-01-09 ENCOUNTER — Ambulatory Visit (HOSPITAL_BASED_OUTPATIENT_CLINIC_OR_DEPARTMENT_OTHER): Payer: Medicare HMO | Admitting: Physical Therapy

## 2022-01-09 DIAGNOSIS — R2689 Other abnormalities of gait and mobility: Secondary | ICD-10-CM

## 2022-01-09 DIAGNOSIS — M25651 Stiffness of right hip, not elsewhere classified: Secondary | ICD-10-CM | POA: Diagnosis not present

## 2022-01-09 DIAGNOSIS — R531 Weakness: Secondary | ICD-10-CM

## 2022-01-09 DIAGNOSIS — M7601 Gluteal tendinitis, right hip: Secondary | ICD-10-CM | POA: Diagnosis not present

## 2022-01-09 DIAGNOSIS — M25552 Pain in left hip: Secondary | ICD-10-CM | POA: Diagnosis not present

## 2022-01-09 DIAGNOSIS — M25551 Pain in right hip: Secondary | ICD-10-CM | POA: Diagnosis not present

## 2022-01-09 DIAGNOSIS — M7602 Gluteal tendinitis, left hip: Secondary | ICD-10-CM | POA: Diagnosis not present

## 2022-01-09 NOTE — Telephone Encounter (Signed)
Pt states it has been more than 6 months since she has seen her last neurologist, Marlowe Kays. She is needing a new referral.  ?

## 2022-01-09 NOTE — Telephone Encounter (Signed)
Referral placed.

## 2022-01-09 NOTE — Therapy (Signed)
?OUTPATIENT PHYSICAL THERAPY LOWER EXTREMITY Treatment  ? ? ?Patient Name: Danielle Rocha ?MRN: [id number] ?DOB:[birth year], 71 y.o., female ?Today's Date: 01/09/2022 ? ? PT End of Session - 01/09/22 0903   ? ? Visit Number 5   ? Number of Visits 16   ? Date for PT Re-Evaluation 02/21/22   ? Authorization Type Aetna Medicare progress note at visit 10 Kx at visit 15   ? PT Start Time 0900   ? PT Stop Time [id number]   ? PT Time Calculation (min) 45 min   ? Activity Tolerance Patient tolerated treatment well   ? ?  ?  ? ?  ? ? ?Past Medical History:  ?Diagnosis Date  ? Arthritis   ? DJD, low back, thumb  ? Atrial fibrillation ([place])   ? Xarelto anticoagulation. Flecainide antiarrythmic   ? Chicken pox   ? Chronic pain syndrome   ? On disability. History of bilateral hip pain, low back pain. Gabapentin 400 BID, percocet 1 tablet daily per prior provider.   ? Colon polyp   ? awaiting records  ? Depression   ? zoloft 100mg , remeron 30mg  per psychiatry. ambien 10mg  per psychiatry to help wtih sleep element.   ? Dystonia   ? described as psychogenic dystonia. Pain and twisting from upper chest and up with triggers "wind, creamy food" on TID ativan per psychiatry previously.   ? Fibromyalgia   ? GERD (gastroesophageal reflux disease)   ? omeprazole OTC  ? Goiter   ? states multiple imaging tests, has had biopsies  ? Hyperlipidemia   ? lovastatin 20mg   ? Stroke [facility])   ? TIA (left side of face and body decreased sensitivity than right face and side)  ? TIA (transient ischemic attack)   ? Vaginal atrophy   ? estrace vaginal cream  ? ?Past Surgical History:  ?Procedure Laterality Date  ? ATRIAL FIBRILLATION ABLATION    ? BIOPSY THYROID    ? fusion l4-l5  09/22/1998  ? LAMINECTOMY    ? L4-L5  ? OTHER SURGICAL HISTORY    ? tennis elbow surgery  ? piriformis release  09/22/1997  ? right hip  ? TONSILLECTOMY AND ADENOIDECTOMY  age 26  ? VAGINAL HYSTERECTOMY  09/23/1991  ? ?Patient Active Problem List  ? Diagnosis Date Noted  ? Sore in nose  07/30/2021  ? Involuntary movements 02/14/2020  ? Depression with anxiety 02/14/2020  ? Aortic atherosclerosis ([place]) 02/07/2020  ? Delayed gastric emptying 02/07/2020  ? Dystonia   ? Restless legs 07/27/2018  ? Recurrent UTI 10/07/2016  ? Major depression in full remission ([place]) 09/02/2016  ? GERD (gastroesophageal reflux disease) 01/30/2016  ? History of SI/intentional drug overdose 11/22/2015  ? Atrial fibrillation ([place])   ? Chronic pain syndrome   ? Hyperlipidemia   ? ? ?PCP: Marin Olp, MD ? ?REFERRING PROVIDER: Marin Olp, MD ? ?REFERRING DIAG: M67.959 (ICD-10-CM) - Tendinopathy of gluteus medius ? ?THERAPY DIAG:  ?Pain in left hip ? ?Pain in right hip ? ?Stiffness of right hip, not elsewhere classified ? ?Other abnormalities of gait and mobility ? ?ONSET DATE: 1998 was the initial onset  ? ?SUBJECTIVE:  ? ?SUBJECTIVE STATEMENT: ?Nothing new. Doin ok ?  ? ?PERTINENT HISTORY: ?L4-L5 fusion 2000; A-fib; chronic pain syndrome; depression ; dystonia ; fibromyalgia;  ? ?PAIN:  ?Are you having pain? Yes ?NPRS scale:  ( not assessing official pain levels on  a regular basis on this time 2nd to pain focus.)  ?Pain location:  starts at the trochanter and goes down the hips  ?Pain description: aching  ?Aggravating factors: Standing/ walking/ sitting  ?Relieving factors: lay down and get on a heating pad and find the right position  ? ?PRECAUTIONS: None ? ?WEIGHT BEARING RESTRICTIONS No ? ?FALLS:  ?Has patient fallen in last 6 months? Yes. Number of falls was having frequent falls. It was found to be medication related.  ? ?LIVING ENVIRONMENT: ?Steps to the attic ? ?OCCUPATION:  ?Retired  ? ?Hobbies: gardening  ? ?PLOF: Independent ? ?PATIENT GOALS   ? ?To have less pain. To improve her ability to perform activity.  ? ? ?OBJECTIVE:  ?  ? ? ?TODAY'S TREATMENT: ?Pt seen for aquatic therapy today.  Treatment took place in water 3.25-4.2 ft in depth at the [facility] pool. Temp of water was 91?.  Pt  entered/exited the pool via stairs (step through pattern) independently with bilat rail. ? ?Warm up: forward / backward walking x 3 laps each; side stepping 3 laps  ?Slow high knee marching forward/ backward holding yellow noodle  ?Holding wall: toe raises and heel raises 2x10; Hip openers (hip abdct/knee flexion) x 15, hip flexion/knee flexion crossing midline x 15 - range to tolerance ?Hip abdct/ add x 5 reps, 2 sets;  Hip ext x 10. ?  ? ?Ai Chi: in staggered stance or wide stance, 10-15 reps each posture, coordinating breath with arm motion:   ?floating, uplifting, enclosing, added folding ? ? Single forward and backward walking laps in between exercises to decrease stiffness from standing postures. ? ?Supported by yellow noodle under arms in seated position with 2lb ankle weights for added distraction x 5 min for lumbar pain relief ?-gentle OC lumber rotation R/L x10-15 ? ? ?Pt requires buoyancy for support and to offload joints with strengthening exercises. Viscosity of the water is needed for resistance of strengthening; water current perturbations provides challenge to standing balance unsupported, requiring increased core activation. ? ? ?PATIENT EDUCATION:  ?Education details: reviewed central nervous system response to pain; self care massage; posture/ lumbar support ?Person educated: Patient ?Education method: Explanation, Demonstration, Tactile cues, Verbal cues ?Education comprehension: verbalized understanding, returned demonstration, verbal cues required, tactile cues required, and needs further education ? ? ?HOME EXERCISE PROGRAM: ?Access Code: [email or web address] ?URL: [email or web address] ?Date: 12/27/2021 ?Prepared by: Carolyne Littles ? ?Exercises ?- Supine Lower Trunk Rotation  - 1 x daily - 7 x weekly - 3 sets - 10 reps ?- Supine Heel Slide  - 1 x daily - 7 x weekly - 3 sets - 10 reps ?- Seated Hamstring Stretch  - 1 x daily - 7 x weekly - 2 sets - 3 reps - 20 sec   hold ? ?ASSESSMENT: ? ?CLINICAL IMPRESSION: ?Pt upbeat about progress with aquatic therapy.  No real changes in pain yet but she has positive attitude.  She completes all tasks with occasional lb discomfort but easily modified to reduce.  Pt edu on continued management of condition suggesting Silver sneaker membership at [facility] which she is going to check on.  She requires minimal cues for execution of exercises.  Added 2 lb weight to ankles for slight increased LB distraction when supported by noodle underarms which pt states feels good. Goals ongoing ? ? ? ?OBJECTIVE IMPAIRMENTS Abnormal gait, decreased activity tolerance, decreased endurance, difficulty walking, decreased ROM, decreased strength, increased fascial restrictions, increased muscle spasms, and pain.  ? ?ACTIVITY LIMITATIONS cleaning, driving, meal prep, occupation, yard work, and shopping.  ? ?PERSONAL FACTORS L4-L5 fusion  2000; A-fib; chronic pain syndrome; depression ; dystonia ; fibromyalgia;  are also affecting patient's functional outcome.  ? ? ?REHAB POTENTIAL: Good for goals  ? ?CLINICAL DECISION MAKING: Evolving/moderate complexity long standing history of variable pain with exercises and activity  ? ?EVALUATION COMPLEXITY: Moderate ? ? ?GOALS: ?Goals reviewed with patient? Yes ? ?SHORT TERM GOALS: Target date: 01/30/2022 ? ?Patient will increase lumbar flexion by 15 degrees  ?Baseline: ?Goal status: INITIAL ? ?2.  Patient will increase passive hip flexion to 90 degrees without pain  ?Baseline:  ?Goal status: INITIAL ? ?3.  Patient will increase gross bilateral LE strength by 5 lbs bilateral  ?Baseline:  ?Goal status: INITIAL ? ?LONG TERM GOALS: Target date: 02/20/2022 ? ?Patient will be independent with a complete exercise program including pool and land exercises ?Baseline:  ?Goal status: INITIAL ? ?2.  Patient will go up and down 5 steps without pain in order to improve mobility in the community  ?Baseline:  ?Goal status: INITIAL ? ?3.  Patient  will garden with <4/10 pain  ?Baseline:  ?Goal status: INITIAL ? ? ? ?PLAN: ?PT FREQUENCY: 1-2x/week ? ?PT DURATION: 8 weeks ? ?PLANNED INTERVENTIONS: Therapeutic exercises, Therapeutic activity, Neuromuscular re-education, [place]

## 2022-01-20 ENCOUNTER — Ambulatory Visit (HOSPITAL_BASED_OUTPATIENT_CLINIC_OR_DEPARTMENT_OTHER): Payer: Medicare HMO | Attending: Orthopaedic Surgery | Admitting: Physical Therapy

## 2022-01-20 ENCOUNTER — Encounter (HOSPITAL_BASED_OUTPATIENT_CLINIC_OR_DEPARTMENT_OTHER): Payer: Self-pay | Admitting: Physical Therapy

## 2022-01-20 DIAGNOSIS — M25651 Stiffness of right hip, not elsewhere classified: Secondary | ICD-10-CM | POA: Diagnosis not present

## 2022-01-20 DIAGNOSIS — M25552 Pain in left hip: Secondary | ICD-10-CM | POA: Insufficient documentation

## 2022-01-20 DIAGNOSIS — M25551 Pain in right hip: Secondary | ICD-10-CM | POA: Insufficient documentation

## 2022-01-20 DIAGNOSIS — R2689 Other abnormalities of gait and mobility: Secondary | ICD-10-CM | POA: Insufficient documentation

## 2022-01-20 NOTE — Therapy (Addendum)
?OUTPATIENT PHYSICAL THERAPY LOWER EXTREMITY Treatment  ? ? ?Patient Name: Danielle Rocha ?MRN: [id number] ?DOB:10-04-1950, 71 y.o., female ?Today's Date: 01/20/2022 ? ? PT End of Session - 01/20/22 0846   ? ? Visit Number 6   ? Number of Visits 16   ? Date for PT Re-Evaluation 02/21/22   ? Authorization Type [employer]na Medicare progress note at visit 10 Kx at visit 15   ? PT Start Time 0845   ? PT Stop Time [phone]   ? PT Time Calculation (min) 43 min   ? Activity Tolerance Patient tolerated treatment well   ? Behavior During Therapy [facility] for tasks assessed/performed   ? ?  ?  ? ?  ? ? ?Past Medical History:  ?Diagnosis Date  ? Arthritis   ? DJD, low back, thumb  ? Atrial fibrillation ([place])   ? Xarelto anticoagulation. Flecainide antiarrythmic   ? Chicken pox   ? Chronic pain syndrome   ? On disability. History of bilateral hip pain, low back pain. Gabapentin 400 BID, percocet 1 tablet daily per prior provider.   ? Colon polyp   ? awaiting records  ? Depression   ? zoloft 100mg , remeron 30mg  per psychiatry. ambien 10mg  per psychiatry to help wtih sleep element.   ? Dystonia   ? described as psychogenic dystonia. Pain and twisting from upper chest and up with triggers "wind, creamy food" on TID ativan per psychiatry previously.   ? Fibromyalgia   ? GERD (gastroesophageal reflux disease)   ? omeprazole OTC  ? Goiter   ? states multiple imaging tests, has had biopsies  ? Hyperlipidemia   ? lovastatin 20mg   ? Stroke [facility])   ? TIA (left side of face and body decreased sensitivity than right face and side)  ? TIA (transient ischemic attack)   ? Vaginal atrophy   ? estrace vaginal cream  ? ?Past Surgical History:  ?Procedure Laterality Date  ? ATRIAL FIBRILLATION ABLATION    ? BIOPSY THYROID    ? fusion l4-l5  09/22/1998  ? LAMINECTOMY    ? L4-L5  ? OTHER SURGICAL HISTORY    ? tennis elbow surgery  ? piriformis release  09/22/1997  ? right hip  ? TONSILLECTOMY AND ADENOIDECTOMY  age 72  ? VAGINAL HYSTERECTOMY  09/23/1991  ? ?Patient  Active Problem List  ? Diagnosis Date Noted  ? Sore in nose 07/30/2021  ? Involuntary movements 02/14/2020  ? Depression with anxiety 02/14/2020  ? Aortic atherosclerosis ([place]) 02/07/2020  ? Delayed gastric emptying 02/07/2020  ? Dystonia   ? Restless legs 07/27/2018  ? Recurrent UTI 10/07/2016  ? Major depression in full remission ([place]) 09/02/2016  ? GERD (gastroesophageal reflux disease) 01/30/2016  ? History of SI/intentional drug overdose 11/22/2015  ? Atrial fibrillation ([place])   ? Chronic pain syndrome   ? Hyperlipidemia   ? ? ?PCP: Marin Olp, MD ? ?REFERRING PROVIDER: Marin Olp, MD ? ?REFERRING DIAG: M67.959 (ICD-10-CM) - Tendinopathy of gluteus medius ? ?THERAPY DIAG:  ?Pain in left hip ? ?Pain in right hip ? ?Stiffness of right hip, not elsewhere classified ? ?Other abnormalities of gait and mobility ? ?ONSET DATE: 1998 was the initial onset  ? ?SUBJECTIVE:  ? ?SUBJECTIVE STATEMENT: ? Pt went to beach last week.  She has been using pillow for back in car and she bought a ball to do self massage.  The drive back was ok on Friday, but Saturday was "rough".  She plans to join the  [employer] this week (Silver sneakers) ? ?PERTINENT HISTORY: ?L4-L5 fusion 2000; A-fib; chronic pain syndrome; depression ; dystonia ; fibromyalgia;  ? ?PAIN:  ?Are you having pain? Yes ?NPRS scale:  7/10 - 01/20/22 ( not assessing official pain levels on  a regular basis on this time 2nd to pain focus.)  ?Pain location: lower back, and at the trochanter and goes down the hips  ?Pain description: aching  ?Aggravating factors: Standing/ walking/ sitting  ?Relieving factors: lay down and get on a heating pad and find the right position  ? ?PRECAUTIONS: None ? ?WEIGHT BEARING RESTRICTIONS No ? ?FALLS:  ?Has patient fallen in last 6 months? Yes. Number of falls was having frequent falls. It was found to be medication related.  ? ?LIVING ENVIRONMENT: ?Steps to the attic ? ?OCCUPATION:  ?Retired  ? ?Hobbies: gardening  ? ?PLOF:  Independent ? ?PATIENT GOALS   ? ?To have less pain. To improve her ability to perform activity.  ? ? ?OBJECTIVE:  ?  ? ? ?TODAY'S TREATMENT: ?Pt seen for aquatic therapy today.  Treatment took place in water 3.25-4.2 ft in depth at the [facility] pool. Temp of water was 92?.  Pt entered/exited the pool via stairs (step through pattern) independently with bilat rail. ? ?Warm up: forward / backward walking x 3 laps each; side stepping 3 laps  ?Slow high knee marching forward, Backwards walking with increased speed- holding yellow noodle  ?Forward reaching low back stretch x 10 sec hold, 4 reps  ? ?Holding wall: toe raises and heel raises x20; squats x 10; Hip abdct/ add x 5 reps, 2 sets;  Hip ext x 10;  hip flexion/knee flexion crossing midline x 15 - range to tolerance ? Hula hoop motion both directions ? ?Ai Chi: in staggered stance or wide stance, 5-10 reps each posture, coordinating breath with arm motion:   ?floating, uplifting. Added yellow noodle between legs for support - enclosing, folding ? ? Single forward and backward walking laps in between exercises to decrease stiffness from standing postures. ? ?Pt requires buoyancy for support and to offload joints with strengthening exercises. Viscosity of the water is needed for resistance of strengthening; water current perturbations provides challenge to standing balance unsupported, requiring increased core activation. ? ? ?PATIENT EDUCATION:  ?Education details: reviewed central nervous system response to pain; self care massage; posture/ lumbar support ?Person educated: Patient ?Education method: Explanation, Demonstration, Tactile cues, Verbal cues ?Education comprehension: verbalized understanding, returned demonstration, verbal cues required, tactile cues required, and needs further education ? ? ?HOME EXERCISE PROGRAM: ?Access Code: [email or web address] ?URL: [email or web address] ?Date: 12/27/2021 ?Prepared by: Carolyne Littles ? ?Exercises ?-  Supine Lower Trunk Rotation  - 1 x daily - 7 x weekly - 3 sets - 10 reps ?- Supine Heel Slide  - 1 x daily - 7 x weekly - 3 sets - 10 reps ?- Seated Hamstring Stretch  - 1 x daily - 7 x weekly - 2 sets - 3 reps - 20 sec  hold ? ?ASSESSMENT: ? ?CLINICAL IMPRESSION: ?Pt reported slight increase in Rt hip discomfort during session.  She reports relief of low back pain with forward flexion (cat pose) and repeated this throughout session.  She requires minimal cues for execution of exercises and posture.  May want to try 2 lb weight to ankles for slight increased LB distraction when supported by noodle underarms next visit again; pt reported relief last visit with this. Goals are ongoing.  ? ? ?OBJECTIVE IMPAIRMENTS Abnormal gait, decreased  activity tolerance, decreased endurance, difficulty walking, decreased ROM, decreased strength, increased fascial restrictions, increased muscle spasms, and pain.  ? ?ACTIVITY LIMITATIONS cleaning, driving, meal prep, occupation, yard work, and shopping.  ? ?PERSONAL FACTORS L4-L5 fusion 2000; A-fib; chronic pain syndrome; depression ; dystonia ; fibromyalgia;  are also affecting patient's functional outcome.  ? ? ?REHAB POTENTIAL: Good for goals  ? ?CLINICAL DECISION MAKING: Evolving/moderate complexity long standing history of variable pain with exercises and activity  ? ?EVALUATION COMPLEXITY: Moderate ? ? ?GOALS: ?Goals reviewed with patient? Yes ? ?SHORT TERM GOALS: Target date: 02/10/2022 ? ?Patient will increase lumbar flexion by 15 degrees  ?Baseline: ?Goal status: INITIAL ? ?2.  Patient will increase passive hip flexion to 90 degrees without pain  ?Baseline:  ?Goal status: INITIAL ? ?3.  Patient will increase gross bilateral LE strength by 5 lbs bilateral  ?Baseline:  ?Goal status: INITIAL ? ?LONG TERM GOALS: Target date: 03/03/2022 ? ?Patient will be independent with a complete exercise program including pool and land exercises ?Baseline:  ?Goal status: INITIAL ? ?2.  Patient  will go up and down 5 steps without pain in order to improve mobility in the community  ?Baseline:  ?Goal status: INITIAL ? ?3.  Patient will garden with <4/10 pain  ?Baseline:  ?Goal status: INITIAL ? ? ? ?P

## 2022-01-22 ENCOUNTER — Ambulatory Visit (HOSPITAL_BASED_OUTPATIENT_CLINIC_OR_DEPARTMENT_OTHER): Payer: Medicare HMO | Admitting: Physical Therapy

## 2022-01-22 ENCOUNTER — Encounter (HOSPITAL_BASED_OUTPATIENT_CLINIC_OR_DEPARTMENT_OTHER): Payer: Self-pay | Admitting: Physical Therapy

## 2022-01-22 DIAGNOSIS — M25552 Pain in left hip: Secondary | ICD-10-CM

## 2022-01-22 DIAGNOSIS — M25551 Pain in right hip: Secondary | ICD-10-CM

## 2022-01-22 DIAGNOSIS — R2689 Other abnormalities of gait and mobility: Secondary | ICD-10-CM | POA: Diagnosis not present

## 2022-01-22 DIAGNOSIS — M25651 Stiffness of right hip, not elsewhere classified: Secondary | ICD-10-CM | POA: Diagnosis not present

## 2022-01-22 NOTE — Therapy (Signed)
?OUTPATIENT PHYSICAL THERAPY LOWER EXTREMITY Treatment  ? ? ?Patient Name: Danielle Rocha ?MRN: [id number] ?DOB:[birth year], 71 y.o., female ?Today's Date: 01/22/2022 ? ? PT End of Session - 01/22/22 1614   ? ? Visit Number 7   ? Number of Visits 16   ? Date for PT Re-Evaluation 02/21/22   ? Authorization Type [employer] progress note at visit 10 Kx at visit 15   ? PT Start Time 1150   PT 5 min late  ? PT Stop Time 1228   ? PT Time Calculation (min) 38 min   ? Activity Tolerance Patient tolerated treatment well   ? Behavior During Therapy [facility] for tasks assessed/performed   ? ?  ?  ? ?  ? ? ? ?Past Medical History:  ?Diagnosis Date  ? Arthritis   ? DJD, low back, thumb  ? Atrial fibrillation (HCC)   ? Xarelto anticoagulation. Flecainide antiarrythmic   ? Chicken pox   ? Chronic pain syndrome   ? On disability. History of bilateral hip pain, low back pain. Gabapentin 400 BID, percocet 1 tablet daily per prior provider.   ? Colon polyp   ? awaiting records  ? Depression   ? zoloft 100mg , remeron 30mg  per psychiatry. ambien 10mg  per psychiatry to help wtih sleep element.   ? Dystonia   ? described as psychogenic dystonia. Pain and twisting from upper chest and up with triggers "wind, creamy food" on TID ativan per psychiatry previously.   ? Fibromyalgia   ? GERD (gastroesophageal reflux disease)   ? omeprazole OTC  ? Goiter   ? states multiple imaging tests, has had biopsies  ? Hyperlipidemia   ? lovastatin 20mg   ? Stroke [facility])   ? TIA (left side of face and body decreased sensitivity than right face and side)  ? TIA (transient ischemic attack)   ? Vaginal atrophy   ? estrace vaginal cream  ? ?Past Surgical History:  ?Procedure Laterality Date  ? ATRIAL FIBRILLATION ABLATION    ? BIOPSY THYROID    ? fusion l4-l5  09/22/1998  ? LAMINECTOMY    ? L4-L5  ? OTHER SURGICAL HISTORY    ? tennis elbow surgery  ? piriformis release  09/22/1997  ? right hip  ? TONSILLECTOMY AND ADENOIDECTOMY  age 33  ? VAGINAL HYSTERECTOMY   09/23/1991  ? ?Patient Active Problem List  ? Diagnosis Date Noted  ? Sore in nose 07/30/2021  ? Involuntary movements 02/14/2020  ? Depression with anxiety 02/14/2020  ? Aortic atherosclerosis (HCC) 02/07/2020  ? Delayed gastric emptying 02/07/2020  ? Dystonia   ? Restless legs 07/27/2018  ? Recurrent UTI 10/07/2016  ? Major depression in full remission (HCC) 09/02/2016  ? GERD (gastroesophageal reflux disease) 01/30/2016  ? History of SI/intentional drug overdose 11/22/2015  ? Atrial fibrillation (HCC)   ? Chronic pain syndrome   ? Hyperlipidemia   ? ? ?PCP: 10/09/2016, MD ? ?REFERRING PROVIDER: 14/08/2016, MD ? ?REFERRING DIAG: M67.959 (ICD-10-CM) - Tendinopathy of gluteus medius ? ?THERAPY DIAG:  ?Pain in left hip ? ?Pain in right hip ? ?Stiffness of right hip, not elsewhere classified ? ?Other abnormalities of gait and mobility ? ?ONSET DATE: 1998 was the initial onset  ? ?SUBJECTIVE:  ? ?SUBJECTIVE STATEMENT: ?Te patient reports she got really sore after the last visit. She had to spend a day in bed She is having pain today down the front of her hips, the back, and the sides.  ?PERTINENT HISTORY: ?  L4-L5 fusion 2000; A-fib; chronic pain syndrome; depression ; dystonia ; fibromyalgia;  ? ?PAIN:  ?Are you having pain? Yes ?NPRS scale:  7/10 - 01/20/22 ( not assessing official pain levels on  a regular basis on this time 2nd to pain focus.)  ?Pain location: lower back, and at the trochanter and goes down the hips  ?Pain description: aching  ?Aggravating factors: Standing/ walking/ sitting  ?Relieving factors: lay down and get on a heating pad and find the right position  ? ?PRECAUTIONS: None ? ?WEIGHT BEARING RESTRICTIONS No ? ?FALLS:  ?Has patient fallen in last 6 months? Yes. Number of falls was having frequent falls. It was found to be medication related.  ? ?LIVING ENVIRONMENT: ?Steps to the attic ? ?OCCUPATION:  ?Retired  ? ?Hobbies: gardening  ? ?PLOF: Independent ? ?PATIENT GOALS   ? ?To have  less pain. To improve her ability to perform activity.  ? ? ?OBJECTIVE:  ?  ? ? ?TODAY'S TREATMENT: ?5/3 ?Pt seen for aquatic therapy today.  Treatment took place in water 3.25-4.2 ft in depth at the [facility] pool. Temp of water was 92?.  Pt entered/exited the pool via stairs (step through pattern) independently with bilat rail. ? ?Warm up: forward / backward walking x 3 laps each; side stepping 2  laps long strides two laps  ? ?Slow high knee marching forward, Backwards walking with increased speed- holding yellow noodle  ? ?Seated LAQ 2x15  ?Board flexion x10  ?Lateral board flexion 2x10  ? ?Holding wall: toe raises and heel raises x20; squats x 10;  ? ?Balance narrow base of support 2x30 sec hold narrow base eyes closed 2x30 sec hold  ? ?Rythmic stepping 2x10  ? ?Box stepping x6 each direction  ? ?Rainbow weight press down x20  ?Rainbow wright quick ext ? ? ? ? ?Pt requires buoyancy for support and to offload joints with strengthening exercises. Viscosity of the water is needed for resistance of strengthening; water current perturbations provides challenge to standing balance unsupported, requiring increased core activation. ? ? ?Last visit  ? ?Pt seen for aquatic therapy today.  Treatment took place in water 3.25-4.2 ft in depth at the [facility] pool. Temp of water was 92?.  Pt entered/exited the pool via stairs (step through pattern) independently with bilat rail. ? ?Warm up: forward / backward walking x 3 laps each; side stepping 3 laps  ?Slow high knee marching forward, Backwards walking with increased speed- holding yellow noodle  ?Forward reaching low back stretch x 10 sec hold, 4 reps  ? ?Holding wall: toe raises and heel raises x20; squats x 10; Hip abdct/ add x 5 reps, 2 sets;  Hip ext x 10;  hip flexion/knee flexion crossing midline x 15 - range to tolerance ? Hula hoop motion both directions ? ?Ai Chi: in staggered stance or wide stance, 5-10 reps each posture, coordinating  breath with arm motion:   ?floating, uplifting. Added yellow noodle between legs for support - enclosing, folding ? ? Single forward and backward walking laps in between exercises to decrease stiffness from standing postures. ? ?Pt requires buoyancy for support and to offload joints with strengthening exercises. Viscosity of the water is needed for resistance of strengthening; water current perturbations provides challenge to standing balance unsupported, requiring increased core activation. ? ? ?PATIENT EDUCATION:  ?Education details: reviewed central nervous system response to pain; self care massage; posture/ lumbar support ?Person educated: Patient ?Education method: Explanation, Demonstration, Tactile cues, Verbal cues ?Education  comprehension: verbalized understanding, returned demonstration, verbal cues required, tactile cues required, and needs further education ? ? ?HOME EXERCISE PROGRAM: ?Access Code: [email or web address] ?URL: [email or web address] ?Date: 12/27/2021 ?Prepared by: Lorayne Benderavid Kajsa Butrum ? ?Exercises ?- Supine Lower Trunk Rotation  - 1 x daily - 7 x weekly - 3 sets - 10 reps ?- Supine Heel Slide  - 1 x daily - 7 x weekly - 3 sets - 10 reps ?- Seated Hamstring Stretch  - 1 x daily - 7 x weekly - 2 sets - 3 reps - 20 sec  hold ? ?ASSESSMENT: ? ?CLINICAL IMPRESSION: ?The patient continues to have pain with activity. She was advised to keep being persistent and consistent with her exercises. Therapy reminded her that even though it hurts, she is doing it and she is not causing any damage. We will continue to educated patient on PNE concepts such as hurt vs harm and central nervous system desensitization.  ? ?OBJECTIVE IMPAIRMENTS Abnormal gait, decreased activity tolerance, decreased endurance, difficulty walking, decreased ROM, decreased strength, increased fascial restrictions, increased muscle spasms, and pain.  ? ?ACTIVITY LIMITATIONS cleaning, driving, meal prep, occupation, yard work, and  shopping.  ? ?PERSONAL FACTORS L4-L5 fusion 2000; A-fib; chronic pain syndrome; depression ; dystonia ; fibromyalgia;  are also affecting patient's functional outcome.  ? ? ?REHAB POTENTIAL: Good for goals  ? ?CLINICAL D

## 2022-01-23 ENCOUNTER — Encounter (HOSPITAL_BASED_OUTPATIENT_CLINIC_OR_DEPARTMENT_OTHER): Payer: Self-pay | Admitting: Physical Therapy

## 2022-01-24 ENCOUNTER — Other Ambulatory Visit: Payer: Self-pay | Admitting: Physician Assistant

## 2022-01-24 ENCOUNTER — Encounter: Payer: Self-pay | Admitting: Physician Assistant

## 2022-01-24 ENCOUNTER — Ambulatory Visit: Payer: Medicare HMO | Admitting: Physician Assistant

## 2022-01-24 VITALS — BP 89/52 | HR 75 | Resp 18 | Ht 68.0 in | Wt 147.0 lb

## 2022-01-24 DIAGNOSIS — F09 Unspecified mental disorder due to known physiological condition: Secondary | ICD-10-CM

## 2022-01-24 DIAGNOSIS — Z8669 Personal history of other diseases of the nervous system and sense organs: Secondary | ICD-10-CM

## 2022-01-24 DIAGNOSIS — R69 Illness, unspecified: Secondary | ICD-10-CM | POA: Diagnosis not present

## 2022-01-24 MED ORDER — SUMATRIPTAN SUCCINATE 100 MG PO TABS: 100.0000 mg | ORAL_TABLET | Freq: Once | ORAL | 5 refills | Status: DC | PRN

## 2022-01-24 MED ORDER — EMGALITY 120 MG/ML ~~LOC~~ SOAJ: 240.0000 mg | Freq: Once | SUBCUTANEOUS | 0 refills | Status: DC

## 2022-01-24 MED ORDER — EMGALITY 120 MG/ML ~~LOC~~ SOAJ
120.0000 mg | SUBCUTANEOUS | 5 refills | Status: DC
Start: 2022-01-24 — End: 2022-07-21

## 2022-01-24 NOTE — Patient Instructions (Signed)
It was a pleasure to see you today at our office.  ? ?Recommendations: ? ?Follow up in  6 months ?Start Emgality as directed  ? ? ? ?RECOMMENDATIONS FOR ALL PATIENTS WITH MEMORY PROBLEMS: ?1. Continue to exercise (Recommend 30 minutes of walking everyday, or 3 hours every week) ?2. Increase social interactions - continue going to [place] and enjoy social gatherings with friends and family ?3. Eat healthy, avoid fried foods and eat more fruits and vegetables ?4. Maintain adequate blood pressure, blood sugar, and blood cholesterol level. Reducing the risk of stroke and cardiovascular disease also helps promoting better memory. ?5. Avoid stressful situations. Live a simple life and avoid aggravations. Organize your time and prepare for the next day in anticipation. ?6. Sleep well, avoid any interruptions of sleep and avoid any distractions in the bedroom that may interfere with adequate sleep quality ?7. Avoid sugar, avoid sweets as there is a strong link between excessive sugar intake, diabetes, and cognitive impairment ?We discussed the Mediterranean diet, which has been shown to help patients reduce the risk of progressive memory disorders and reduces cardiovascular risk. This includes eating fish, eat fruits and green leafy vegetables, nuts like almonds and hazelnuts, walnuts, and also use olive oil. Avoid fast foods and fried foods as much as possible. Avoid sweets and sugar as sugar use has been linked to worsening of memory function. ? ?There is always a concern of gradual progression of memory problems. If this is the case, then we may need to adjust level of care according to patient needs. Support, both to the patient and caregiver, should then be put into place.  ? ? ?FALL PRECAUTIONS: Be cautious when walking. Scan the area for obstacles that may increase the risk of trips and falls. When getting up in the mornings, sit up at the edge of the bed for a few minutes before getting out of bed. Consider elevating  the bed at the head end to avoid drop of blood pressure when getting up. Walk always in a well-lit room (use night lights in the walls). Avoid area rugs or power cords from appliances in the middle of the walkways. Use a walker or a cane if necessary and consider physical therapy for balance exercise. Get your eyesight checked regularly. ? ?FINANCIAL OVERSIGHT: Supervision, especially oversight when making financial decisions or transactions is also recommended. ? ?HOME SAFETY: Consider the safety of the kitchen when operating appliances like stoves, microwave oven, and blender. Consider having supervision and share cooking responsibilities until no longer able to participate in those. Accidents with firearms and other hazards in the house should be identified and addressed as well. ? ? ?ABILITY TO BE LEFT ALONE: If patient is unable to contact 911 operator, consider using LifeLine, or when the need is there, arrange for someone to stay with patients. Smoking is a fire hazard, consider supervision or cessation. Risk of wandering should be assessed by caregiver and if detected at any point, supervision and safe proof recommendations should be instituted. ? ?MEDICATION SUPERVISION: Inability to self-administer medication needs to be constantly addressed. Implement a mechanism to ensure safe administration of the medications. ? ? ?DRIVING: Regarding driving, in patients with progressive memory problems, driving will be impaired. We advise to have someone else do the driving if trouble finding directions or if minor accidents are reported. Independent driving assessment is available to determine safety of driving. ? ? ? ?Start Emgality as directed     ?Take imitrex  at earliest onset of headache.   ?  Limit use of pain relievers to no more than 2 days out of the week.  These medications include acetaminophen, NSAIDs (ibuprofen/Advil/Motrin, naproxen/Aleve, triptans (Imitrex/sumatriptan), Excedrin, and narcotics.  This  will help reduce risk of rebound headaches. ?Be aware of common food triggers: ? - Caffeine:  coffee, black tea, cola, Mt. Dew ? - Chocolate ? - Dairy:  aged cheeses (brie, blue, cheddar, gouda, [place], provolone, [place], Swiss, etc), chocolate milk, buttermilk, sour cream, limit eggs and yogurt ? - Nuts, peanut butter ? - Alcohol ? - Cereals/grains:  FRESH breads (fresh bagels, sourdough, doughnuts), yeast productions ? - Processed/canned/aged/cured meats (pre-packaged deli meats, hotdogs) ? - MSG/glutamate:  soy sauce, flavor enhancer, pickled/preserved/marinated foods ? - Sweeteners:  aspartame (Equal, Nutrasweet).  Sugar and Splenda are okay ? - Vegetables:  legumes (lima beans, lentils, snow peas, fava beans, pinto peans, peas, garbanzo beans), sauerkraut, onions, olives, pickles ? - Fruit:  avocados, bananas, citrus fruit (orange, lemon, grapefruit), mango ? - Other:  Frozen meals, macaroni and cheese ?Routine exercise ?Stay adequately hydrated (aim for 64 oz water daily) ?Keep headache diary ?Maintain proper stress management ?Maintain proper sleep hygiene ?Do not skip meals ?Consider supplements:  magnesium citrate 400mg  daily, riboflavin 400mg  daily, coenzyme Q10 100mg  three times daily.  ? [facility] [phone] or [phone] ?  ?

## 2022-01-24 NOTE — Progress Notes (Signed)
? ?Assessment/Plan:  ? ?Mild Cognitive Impairment  ?Danielle Rocha is a 71 y.o. year old female with risk factors including atrial fibrillation on anticoagulation, fibromyalgia, history of TIA psychogenic dystonia,  chronic pain syndrome, hyperlipidemia, depression, restless leg syndrome, anxiety, thyroid nodule seen today for evaluation of memory loss. MRI brain wo contrast 06/06/21  without acute abnormalities in the brainstem or cerebellum MoCAwas  22/30 . She is not on antidementia meds. She reports that she feels better than in prior visit, once she took better control of her emotional issues.  ? ? Recommendations:  ? ?Discussed safety both in and out of the home.  ?Discussed the importance of regular daily schedule with inclusion of crossword puzzles to maintain brain function.  ?Continue to monitor mood by PCP ?Stay active at least 30 minutes at least 3 times a week.  ?Naps should be scheduled and should be no longer than 60 minutes and should not occur after 2 PM.  ?Mediterranean diet is recommended  ?Control cardiovascular risk factors  ?No antidementia meds indicated at this time ?Follow up in 6  months. ? ? ?Migraine headaches  ?Patient has a prior history of migraine headaches, initially treated with Imitrex with some help.  Headaches have not resolved with the medication.  She is on Tylenol.  She has a frequency between 15 and 20 a month, they are very severe.  She has photophobia and nausea with these events. ?Start Emgality, take 1 injection x 2 equals 240 mg loading dose, then followed by 1 injection 120 mg subcu every 30 days for prevention.  Patient has a history of hypotension so beta-blocker cannot be used.  She also is on 3 antidepressants, and she is allergic to sulfa, therefore several of the other options for abortive medications unable to be utilized. ?Continue Imitrex as needed for migraine ?Continue antiemetic as needed. ? ? ? ?Case discussed with Dr. Delice Lesch who agrees with the  plan ? ? ? ? ?Subjective:  ? ? ?Danielle Rocha is a very pleasant 71 y.o. RH female  seen today in follow up for memory loss. This patient is here alone.  Previous records as well as any outside records available were reviewed prior to todays visit.  Patient was last seen at our office on 06/14/2021 MoCA was 22/30, suspicious for mild cognitive [place].  Patient is not on anticoagulation medication at this time.  ?Any changes in memory since last visit?  She reports that her memory is much better, since she has been taking some charge of her life.  Apparently, the patient complains that she has been not in control for the longest time, allowing her significant other to perform most of the ADLs, including driving.  She now has been more in charge, she is able to socialize more, she is able to hold conversations, is in a happier mood.  She is able to remember more about this conversation, and is able to retrieve information without difficulty. ?Patient lives with: Spouse   ?repeats oneself?  Denies ?Disoriented when walking into a room?  Patient denies   ?Leaving objects in unusual places?  Patient denies   ?Ambulates  with difficulty?   Patient denies   ?Recent falls?  Patient denies   ?Any head injuries?  Patient denies   ?History of seizures?   Patient denies   ?Wandering behavior?  Patient denies   ?Patient drives?   Patient no longer drives  Patient drives with assistance  Patient uses [employer] to drive   ?Any  mood changes such irritability agitation?  Patient denies   ?Any history of depression?:  The patient has a history of depression, has been on antidepressant.  Of note, she was premorbidly attending visits with psychotherapist, and has reconnected, has an appointment soon, to deal with her cognitive behavioral issues. ?Hallucinations?  Patient denies   ?Paranoia?  Patient denies   ?Patient reports that he sleeps well without vivid dreams  ?History of sleep apnea?  Patient denies   ?Any hygiene concerns?  Patient  denies   ?Independent of bathing and dressing?  Endorsed  ?Does the patient needs help with medications?  She denies needing any help with medications.  She is to allow her husband to dispense meds, now she does not know her, denies missing any doses. ?Who is in charge of the finances?  Patient is in charge.  Her husband needs to be in charge, but now she has taken over this. ?Any changes in appetite?  Patient denies, but admits having had gained a few pounds. ?Patient have trouble swallowing? Patient denies   ?Does the patient cook?  Patient denies   ?Any kitchen accidents such as leaving the stove on? Patient denies   ?Any headaches?   She also complains of a history of headaches, especially over the last 6 months, where she has to stays in, in the dark, in a quiet area, without sounds, she has to put sunglasses on and rest.  She also complains during these events to have a "tunnel vision with flashes ".  Patient uses Imitrex as needed.  Frequency is about 15-20 headaches a month.  She denies taking significant caffeine, occasionally she drinks tea.  She denies using Tylenol in excessive amounts. ?The double vision? Patient denies   ?Any focal numbness or tingling?  Patient denies   ?Chronic back pain Patient denies   ?Unilateral weakness?  Patient denies   ?Any tremors?  Patient denies.  In the past, she had dystonia, but this has resolved. ?Any history of anosmia?  Patient denies   ?Any incontinence of urine?  Patient denies   ?Any bowel dysfunction?  She has issues with chronic constipation. ?  ? Initial Visit 06/14/21 The patient is seen in neurologic consultation at the request of Marin Olp, MD for the evaluation of memory.  The patient is accompanied by son who supplements the history.She is a 71 y.o. year old female who has had memory issues for about  5 years, especially thinking of recent information. She finds herself repeating the same stories and asking the same questions. Word finding games are  more difficult. Her long term memory is good. Her son reports that recently, she has become more disoriented. For example, in [place], she got lost 3 times. She has noticed depressed mood. She carries a diagnosis of depression and has lost interest in activities such as word finding, exercising and socializing. Her son is very active in her care, but he does  admit that her husband "does everything for her, so she hardly does anything but does care for her Down syndrome son".  She reports a "lot of stress in my life". She is on 3 medications to help her sleep and for mood, including Zoloft, Gabapentin and Wellbutrin.  ?  ? She sleeps from 11-6 am and reports "a hard time staying asleep. She admits to vivid dreams., denies sleepwalking. At night, she reports a person without a face, a female who passes her at the hallway. This event has been present  for about 1 year. She also has some auditory hallucinations, hearing someone in the kitchen ( although son says he hears them too). She feels "paranoid, worry about everything". Her son says that "she overreacts, stressed all the time, doubts herself from cooking to everything else". She is under the care of a therapist to help her cope, and she likes him. She denies leaving objects in unusual places. She is independent of dressing and bathing. She denies missing any meds doses. Her husband has always been in charge of the finances. Her appetite is good, denies trouble swallowing.  She cooks and denies leaving the stove on or the faucet on. Ambulates without a  walker or cane, but has been experiencing falls,  "unstable with the feet ", no loss of consciousness.  She is prone to fall if she is on the grass, but not when she is on a parking lot. Had a appointment with Cardiology yesterday, not felt to be of cardiac etiology. She was not orthostatic.  Denies head injuries. She continues to drive but has gotten lost 3 times over the last week for 3 different appts. ?  ?Denies  headaches, double vision,  she has intermittent dizziness,  denies focal numbness or tingling, unilateral weakness.  She has resting tremors when stressed. Denies urine incontinence or retention, constipatio

## 2022-01-27 ENCOUNTER — Encounter (HOSPITAL_BASED_OUTPATIENT_CLINIC_OR_DEPARTMENT_OTHER): Payer: Self-pay | Admitting: Physical Therapy

## 2022-01-27 ENCOUNTER — Ambulatory Visit (HOSPITAL_BASED_OUTPATIENT_CLINIC_OR_DEPARTMENT_OTHER): Payer: Medicare HMO | Admitting: Physical Therapy

## 2022-01-27 DIAGNOSIS — R2689 Other abnormalities of gait and mobility: Secondary | ICD-10-CM

## 2022-01-27 DIAGNOSIS — M25551 Pain in right hip: Secondary | ICD-10-CM | POA: Diagnosis not present

## 2022-01-27 DIAGNOSIS — M25552 Pain in left hip: Secondary | ICD-10-CM | POA: Diagnosis not present

## 2022-01-27 DIAGNOSIS — M25651 Stiffness of right hip, not elsewhere classified: Secondary | ICD-10-CM

## 2022-01-27 NOTE — Therapy (Signed)
?OUTPATIENT PHYSICAL THERAPY LOWER EXTREMITY Treatment  ? ? ?Patient Name: Danielle Rocha ?MRN: [id number] ?DOB:Mar 08, 1951, 71 y.o., female ?Today's Date: 01/27/2022 ? ? PT End of Session - 01/27/22 0847   ? ? Visit Number 8   ? Number of Visits 16   ? Date for PT Re-Evaluation 02/21/22   ? Authorization Type [employer] Medicare progress note at visit 10 Kx at visit 15   ? PT Start Time 0848   ? PT Stop Time [id number]   ? PT Time Calculation (min) 40 min   ? Activity Tolerance Patient tolerated treatment well   ? Behavior During Therapy [facility] for tasks assessed/performed   ? ?  ?  ? ?  ? ? ? ?Past Medical History:  ?Diagnosis Date  ? Arthritis   ? DJD, low back, thumb  ? Atrial fibrillation (HCC)   ? Xarelto anticoagulation. Flecainide antiarrythmic   ? Chicken pox   ? Chronic pain syndrome   ? On disability. History of bilateral hip pain, low back pain. Gabapentin 400 BID, percocet 1 tablet daily per prior provider.   ? Colon polyp   ? awaiting records  ? Depression   ? zoloft 100mg , remeron 30mg  per psychiatry. ambien 10mg  per psychiatry to help wtih sleep element.   ? Dystonia   ? described as psychogenic dystonia. Pain and twisting from upper chest and up with triggers "wind, creamy food" on TID ativan per psychiatry previously.   ? Fibromyalgia   ? GERD (gastroesophageal reflux disease)   ? omeprazole OTC  ? Goiter   ? states multiple imaging tests, has had biopsies  ? Hyperlipidemia   ? lovastatin 20mg   ? Stroke [facility])   ? TIA (left side of face and body decreased sensitivity than right face and side)  ? TIA (transient ischemic attack)   ? Vaginal atrophy   ? estrace vaginal cream  ? ?Past Surgical History:  ?Procedure Laterality Date  ? ATRIAL FIBRILLATION ABLATION    ? BIOPSY THYROID    ? fusion l4-l5  09/22/1998  ? LAMINECTOMY    ? L4-L5  ? OTHER SURGICAL HISTORY    ? tennis elbow surgery  ? piriformis release  09/22/1997  ? right hip  ? TONSILLECTOMY AND ADENOIDECTOMY  age 41  ? VAGINAL HYSTERECTOMY  09/23/1991  ? ?Patient  Active Problem List  ? Diagnosis Date Noted  ? Sore in nose 07/30/2021  ? Involuntary movements 02/14/2020  ? Depression with anxiety 02/14/2020  ? Aortic atherosclerosis (HCC) 02/07/2020  ? Delayed gastric emptying 02/07/2020  ? Dystonia   ? Restless legs 07/27/2018  ? Recurrent UTI 10/07/2016  ? Major depression in full remission (HCC) 09/02/2016  ? GERD (gastroesophageal reflux disease) 01/30/2016  ? History of SI/intentional drug overdose 11/22/2015  ? Atrial fibrillation (HCC)   ? Chronic pain syndrome   ? Hyperlipidemia   ? ? ?PCP: 10/09/2016, MD ? ?REFERRING PROVIDER: 14/08/2016, MD ? ?REFERRING DIAG: M67.959 (ICD-10-CM) - Tendinopathy of gluteus medius ? ?THERAPY DIAG:  ?Pain in left hip ? ?Pain in right hip ? ?Stiffness of right hip, not elsewhere classified ? ?Other abnormalities of gait and mobility ? ?ONSET DATE: 1998 was the initial onset  ? ?SUBJECTIVE:  ? ?SUBJECTIVE STATEMENT: ?Pt reports soreness at [place] joint and a migraine today.  She reports she had a few days of less pain following last aquatic session, "I can tell it's helping".  ? ?PERTINENT HISTORY: ?L4-L5 fusion 2000; A-fib; chronic pain syndrome; depression ;  dystonia ; fibromyalgia;  ? ?PAIN:  ?Are you having pain? Yes ?NPRS scale:  7/10 - 01/20/22 ( not assessing official pain levels on  a regular basis on this time 2nd to pain focus.)  ?Pain location: lower back, and at the trochanter and goes down the hips  ?Pain description: aching  ?Aggravating factors: Standing/ walking/ sitting  ?Relieving factors: lay down and get on a heating pad and find the right position  ? ?PRECAUTIONS: None ? ?WEIGHT BEARING RESTRICTIONS No ? ?FALLS:  ?Has patient fallen in last 6 months? Yes. Number of falls was having frequent falls. It was found to be medication related.  ? ?LIVING ENVIRONMENT: ?Steps to the attic ? ?OCCUPATION:  ?Retired  ? ?Hobbies: gardening  ? ?PLOF: Independent ? ?PATIENT GOALS   ?To have less pain. To improve her ability  to perform activity.  ? ? ?OBJECTIVE:  ?  ? ? ?TODAY'S TREATMENT: ?5/8 ?Pt seen for aquatic therapy today.  Treatment took place in water 3.25-4.2 ft in depth at the [facility] pool. Temp of water was 92?.  Pt entered/exited the pool via stairs (step through pattern) independently with bilat rail. ? ? Warm up: forward / backward walking x 3 laps each; side stepping 3  laps ? Slow high knee marching forward, Backwards walking with increased speed- holding yellow noodle  ? Box stepping x6 each direction  ? Holding wall: toe/heel raises x20; squats x 10 ? Seated: LAQ x 10 each leg with DF of ankle  ?  Lumbar flexion x10 holding kick board  ?  Lumbar lateral flexion x 10 holding kick board on surface for stretch ? Holding rainbow hand buoys under water and forward/ backward walking  ? Rainbow buoy push downs at side x 10 ? Tandem stance x 20 sec, each foot forward x 2 reps ? Forward reaching low back stretch x 10 sec hold ? Hula hoop motion both directions ? Hip abdct/ add x 10 reps, alternating ? Ai Chi: Enclosing, soothing, uplifting - cues to bend at knees and hips to reduce increased lordosis ? Repeated lumbar stretch forward at wall;  walking forward/ backward; sitting on noodle between legs for recovery ? ?Pt requires buoyancy for support and to offload joints with strengthening exercises. Viscosity of the water is needed for resistance of strengthening; water current perturbations provides challenge to standing balance unsupported, requiring increased core activation. ? ? ?PATIENT EDUCATION:  ?Education details: reviewed central nervous system response to pain; self care massage; posture/ lumbar support ?Person educated: Patient ?Education method: Explanation, Demonstration, Tactile cues, Verbal cues ?Education comprehension: verbalized understanding, returned demonstration, verbal cues required, tactile cues required, and needs further education ? ? ?HOME EXERCISE PROGRAM: ?Access Code: [email or web address] ?URL:  [email or web address] ?Date: 12/27/2021 ?Prepared by: Lorayne Bender ? ?Exercises ?- Supine Lower Trunk Rotation  - 1 x daily - 7 x weekly - 3 sets - 10 reps ?- Supine Heel Slide  - 1 x daily - 7 x weekly - 3 sets - 10 reps ?- Seated Hamstring Stretch  - 1 x daily - 7 x weekly - 2 sets - 3 reps - 20 sec  hold ? ?ASSESSMENT: ? ?CLINICAL IMPRESSION: ? Pt able to complete exercises without increase in pain, but some "tenderness" around hips at conclusion of session. Cues required for more neutral spine (instead of hips forward, increased lumbar lordosis). She reports reduction of low back discomfort with lumbar flexion stretch sporadically throughout session.  We will continue to educated patient on PNE  concepts such as hurt vs harm and central nervous system desensitization.  ? ?OBJECTIVE IMPAIRMENTS Abnormal gait, decreased activity tolerance, decreased endurance, difficulty walking, decreased ROM, decreased strength, increased fascial restrictions, increased muscle spasms, and pain.  ? ?ACTIVITY LIMITATIONS cleaning, driving, meal prep, occupation, yard work, and shopping.  ? ?PERSONAL FACTORS L4-L5 fusion 2000; A-fib; chronic pain syndrome; depression ; dystonia ; fibromyalgia;  are also affecting patient's functional outcome.  ? ? ?REHAB POTENTIAL: Good for goals  ? ?CLINICAL DECISION MAKING: Evolving/moderate complexity long standing history of variable pain with exercises and activity  ? ?EVALUATION COMPLEXITY: Moderate ? ? ?GOALS: ?Goals reviewed with patient? Yes ? ?SHORT TERM GOALS: Target date: 02/17/2022 ? ?Patient will increase lumbar flexion by 15 degrees  ?Baseline: ?Goal status: INITIAL ? ?2.  Patient will increase passive hip flexion to 90 degrees without pain  ?Baseline:  ?Goal status: INITIAL ? ?3.  Patient will increase gross bilateral LE strength by 5 lbs bilateral  ?Baseline:  ?Goal status: INITIAL ? ?LONG TERM GOALS: Target date: 03/10/2022 ? ?Patient will be independent with a  complete exercise program including pool and land exercises ?Baseline:  ?Goal status: INITIAL ? ?2.  Patient will go up and down 5 steps without pain in order to improve mobility in the community  ?[place]

## 2022-01-29 ENCOUNTER — Telehealth: Payer: Self-pay | Admitting: Physician Assistant

## 2022-01-29 NOTE — Telephone Encounter (Signed)
Pt called in with a few concerns about starting the headache medication. She is on Xeralto already and it can cause bleeding. She says she had MRSA years ago and is concerned with having an injection and having the wound. She would like to find out if there was an oral option? If Clarise Cruz doesn't believe it will be a problem, then she is ok with the injection.  ?

## 2022-01-29 NOTE — Telephone Encounter (Signed)
Pt called right back and said she ended up talking with her pharmacist just a few minutes ago and they made her feel better about it. She has decided to go ahead and take the injection. She doesn't need a call back ?

## 2022-01-30 ENCOUNTER — Other Ambulatory Visit (HOSPITAL_COMMUNITY): Payer: Self-pay

## 2022-01-30 ENCOUNTER — Telehealth (HOSPITAL_COMMUNITY): Payer: Self-pay | Admitting: Pharmacy Technician

## 2022-01-30 NOTE — Telephone Encounter (Signed)
Patient Advocate Encounter ?  ?Received notification that prior authorization for Emgality 120 mg/ml Pen Inj is required. ?  ?PA submitted on 01/30/2022 ?Key [id number] ?Status is pending ?   ? ? ? ?Roland Earl, CPhT ?Pharmacy Patient Advocate Specialist ?[facility] Patient Advocate Team ?Direct Number: [phone]  Fax: [phone]  ?

## 2022-01-31 ENCOUNTER — Ambulatory Visit (HOSPITAL_BASED_OUTPATIENT_CLINIC_OR_DEPARTMENT_OTHER): Payer: Medicare HMO | Admitting: Orthopaedic Surgery

## 2022-01-31 ENCOUNTER — Ambulatory Visit (HOSPITAL_BASED_OUTPATIENT_CLINIC_OR_DEPARTMENT_OTHER): Payer: Medicare HMO | Admitting: Physical Therapy

## 2022-01-31 ENCOUNTER — Telehealth: Payer: Self-pay | Admitting: Physician Assistant

## 2022-01-31 DIAGNOSIS — M25552 Pain in left hip: Secondary | ICD-10-CM

## 2022-01-31 DIAGNOSIS — M67951 Unspecified disorder of synovium and tendon, right thigh: Secondary | ICD-10-CM

## 2022-01-31 DIAGNOSIS — M25511 Pain in right shoulder: Secondary | ICD-10-CM | POA: Diagnosis not present

## 2022-01-31 DIAGNOSIS — M25551 Pain in right hip: Secondary | ICD-10-CM | POA: Diagnosis not present

## 2022-01-31 DIAGNOSIS — M67959 Unspecified disorder of synovium and tendon, unspecified thigh: Secondary | ICD-10-CM

## 2022-01-31 DIAGNOSIS — M67952 Unspecified disorder of synovium and tendon, left thigh: Secondary | ICD-10-CM

## 2022-01-31 DIAGNOSIS — R2689 Other abnormalities of gait and mobility: Secondary | ICD-10-CM

## 2022-01-31 DIAGNOSIS — M25651 Stiffness of right hip, not elsewhere classified: Secondary | ICD-10-CM

## 2022-01-31 MED ORDER — TRIAMCINOLONE ACETONIDE 40 MG/ML IJ SUSP
80.0000 mg | INTRAMUSCULAR | Status: AC | PRN
  Administered 2022-01-31: 80 mg via INTRA_ARTICULAR

## 2022-01-31 MED ORDER — LIDOCAINE HCL 1 % IJ SOLN
4.0000 mL | INTRAMUSCULAR | Status: AC | PRN
  Administered 2022-01-31: 4 mL

## 2022-01-31 NOTE — Telephone Encounter (Signed)
Emgality is approved, patient  can pick up Rx.  ?

## 2022-01-31 NOTE — Telephone Encounter (Signed)
Patient Advocate Encounter ? ?Prior Authorization for [employer] 120MG /ML auto-injectors (migraine) has been approved.   ? ?PA# [id number] ?Effective dates: 01/30/2022 through 09/21/2022 ? ? ? ? ? ?Lyndel Safe, CPhT ?Pharmacy Patient Advocate Specialist ?[facility] Patient Advocate Team ?Direct Number: [phone]  Fax: [phone]  ?

## 2022-01-31 NOTE — Telephone Encounter (Signed)
Patient left message with access nurse.  She states she has been experiencing migraine headaches and was supposed to have been prescribed a medication.  Her medication has been on hold due to a prior authorization issue. ?

## 2022-01-31 NOTE — Progress Notes (Signed)
? ?                            ? ? ?Chief Complaint: Bilateral hip pain ?  ? ? ?History of Present Illness:  ? ?01/31/2022: Presents today for follow-up of her bilateral hips.  She is also been having tenderness and pain about her right [place] joint as well as pain in the upper neck.  Overall she is making significant improvements with physical therapy for her hips although that being said she is hoping to get additional injections today as she is hoping to optimize her ability to work through physical therapy. ? ? ?Danielle Rocha is a 71 y.o. female presents today with right worse than left hip pain.  This has been going on since 1998.  She has an extremely extensive history of treatment on both of these hips.  She states that the right has been the focus of surgical treatment.  Specifically she did have a right piriformis release done in 1999 in [place] as well as an L4-L5 fusion which was done at [facility] around this time.  She states that she got no relief from either of these procedures.  She continues to experience pain about the lateral aspect of the hips.  She has had multiple injections into the SI and into the hips bilaterally.  These were all done many years prior.  She has been working on physical therapy for multiple years and most recently began working on the hips a year prior.  She is not getting any type of long-term relief.  She is the mother of [name] who I have been treating for his knee. ? ? ? ?Surgical History:   ?As above ? ?PMH/PSH/Family History/Social History/Meds/Allergies:   ? ?Past Medical History:  ?Diagnosis Date  ?? Arthritis   ? DJD, low back, thumb  ?? Atrial fibrillation ([place])   ? Xarelto anticoagulation. Flecainide antiarrythmic   ?? Chicken pox   ?? Chronic pain syndrome   ? On disability. History of bilateral hip pain, low back pain. Gabapentin 400 BID, percocet 1 tablet daily per prior provider.   ?? Colon polyp   ? awaiting records  ?? Depression   ? zoloft 100mg , remeron  30mg  per psychiatry. ambien 10mg  per psychiatry to help wtih sleep element.   ?? Dystonia   ? described as psychogenic dystonia. Pain and twisting from upper chest and up with triggers "wind, creamy food" on TID ativan per psychiatry previously.   ?? Fibromyalgia   ?? GERD (gastroesophageal reflux disease)   ? omeprazole OTC  ?? Goiter   ? states multiple imaging tests, has had biopsies  ?? Hyperlipidemia   ? lovastatin 20mg   ?? Stroke [facility])   ? TIA (left side of face and body decreased sensitivity than right face and side)  ?? TIA (transient ischemic attack)   ?? Vaginal atrophy   ? estrace vaginal cream  ? ?Past Surgical History:  ?Procedure Laterality Date  ?? ATRIAL FIBRILLATION ABLATION    ?? BIOPSY THYROID    ?? fusion l4-l5  09/22/1998  ?? LAMINECTOMY    ? L4-L5  ?? OTHER SURGICAL HISTORY    ? tennis elbow surgery  ?? piriformis release  09/22/1997  ? right hip  ?? TONSILLECTOMY AND ADENOIDECTOMY  age 89  ?? VAGINAL HYSTERECTOMY  09/23/1991  ? ?Social History  ? ?Socioeconomic History  ?? Marital status: Married  ?  Spouse name: Not on file  ??  Number of children: 3  ?? Years of education: 79  ?? Highest education level: Not on file  ?Occupational History  ?? Occupation: retired  ?Tobacco Use  ?? Smoking status: Never  ?? Smokeless tobacco: Never  ?Vaping Use  ?? Vaping Use: Never used  ?Substance and Sexual Activity  ?? Alcohol use: No  ?  Alcohol/week: 0.0 standard drinks  ?? Drug use: No  ?? Sexual activity: Not Currently  ?  Partners: Male  ?Other Topics Concern  ?? Not on file  ?Social History Narrative  ? Family: Married. Husband works as Geophysicist/field seismologist for [employer]. 2 children from previous marriage 1 from current. Son [name] lives with them and has down's syndrome.   ?   ? 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 Moved to [facility] from [id number] and became disabled in that time frame due to hip/back issues.   ?   ? Hobbies: time with son  ? Right handed  ? One story home  ?  Caffeine occasionally  ? ?Social Determinants of Health  ? ?Financial Resource Strain: Low Risk   ?? Difficulty of Paying Living Expenses: Not hard at all  ?Food Insecurity: No Food Insecurity  ?? Worried About Charity fundraiser in the Last Year: Never true  ?? Ran Out of Food in the Last Year: Never true  ?Transportation Needs: No Transportation Needs  ?? Lack of Transportation (Medical): No  ?? Lack of Transportation (Non-Medical): No  ?Physical Activity: Insufficiently Active  ?? Days of Exercise per Week: 3 days  ?? Minutes of Exercise per Session: 30 min  ?Stress: No Stress Concern Present  ?? Feeling of Stress : Not at all  ?Social Connections: Unknown  ?? Frequency of Communication with Friends and Family: More than three times a week  ?? Frequency of Social Gatherings with Friends and Family: More than three times a week  ?? Attends Religious Services: More than 4 times per year  ?? Active Member of Clubs or Organizations: No  ?? Attends Archivist Meetings: Never  ?? Marital Status: Not on file  ? ?Family History  ?Problem Relation Age of Onset  ?? Alcohol abuse Mother   ?     possible she had stomach cancer as well but unsure  ?? Hypertension Father   ?? Alcohol abuse Father   ?? Pancreatic cancer Father   ?? Cancer Brother   ?     spindle cell RLE  ?? Diabetes Brother   ?? Cancer Brother   ?     tonsil cancer  ?? Skin cancer Brother   ?? Colon cancer Neg Hx   ?? Colon polyps Neg Hx   ?? Stomach cancer Neg Hx   ? ?Allergies  ?Allergen Reactions  ?? Reglan [Metoclopramide] Other (See Comments)  ?  Suicidal thoughts  ?? Ciprofloxacin Diarrhea  ?? Clindamycin/Lincomycin Diarrhea  ?? Doxycycline Diarrhea and Nausea And Vomiting  ?? Macrobid [Nitrofurantoin Monohyd Macro] Rash  ?  Rash on lip  ?? Sulfa Antibiotics Rash  ? ?Current Outpatient Medications  ?Medication Sig Dispense Refill  ?? acetaminophen (TYLENOL) 500 MG tablet Take 1,000 mg by mouth every 6 (six) hours as needed for mild pain,  moderate pain, fever or headache.    ?? buPROPion (WELLBUTRIN XL) 150 MG 24 hr tablet Take 150 mg by mouth daily. Per psychiatry    ?? cephALEXin (KEFLEX) 250 MG capsule Take 250 mg by mouth daily. Takes daily for frequent UTIs.    ??  cholecalciferol (VITAMIN D) 1000 units tablet Take 1,000 Units by mouth daily.    ?? co-enzyme Q-10 30 MG capsule Take 30 mg by mouth daily.    ?? Cyanocobalamin (B-12 PO) Take by mouth.    ?? EMGALITY 120 MG/ML SOAJ INJECT  240 MG SUBCUTANEOUSLY ONCE FOR 1 DOSE LOADING DOSE 2 mL 0  ?? FERROUS SULFATE PO Take by mouth.    ?? flecainide (TAMBOCOR) 100 MG tablet Take 1 tablet (100 mg total) by mouth 2 (two) times daily. 180 tablet 3  ?? Galcanezumab-gnlm (EMGALITY) 120 MG/ML SOAJ Inject 120 mg into the skin every 30 (thirty) days. 1.12 mL 5  ?? Melatonin 3 MG TABS Take 2 tablets by mouth at bedtime.     ?? Multiple Vitamin (MULTIVITAMIN WITH MINERALS) TABS tablet Take 1 tablet by mouth daily.    ?? ondansetron (ZOFRAN ODT) 4 MG disintegrating tablet Take 1 tablet (4 mg total) by mouth every 8 (eight) hours as needed for nausea or vomiting. 20 tablet 0  ?? pantoprazole (PROTONIX) 20 MG tablet Take 1 tablet (20 mg total) by mouth 2 (two) times daily before a meal. Take 2 tablets daily. 180 tablet 3  ?? rivaroxaban (XARELTO) 20 MG TABS tablet Take 1 tablet (20 mg total) by mouth daily with supper. 90 tablet 3  ?? rosuvastatin (CRESTOR) 20 MG tablet Take 1 tablet (20 mg total) by mouth daily. 90 tablet 3  ?? sertraline (ZOLOFT) 50 MG tablet Take 1 tablet (50 mg total) by mouth daily. 30 tablet 6  ?? SUMAtriptan (IMITREX) 100 MG tablet Take 1 tablet (100 mg total) by mouth once as needed for up to 1 dose for migraine. 10 tablet 5  ?? traZODone (DESYREL) 50 MG tablet Take 0.5-1 tablets (25-50 mg total) by mouth at bedtime as needed for sleep. 30 tablet 5  ? ?No current facility-administered medications for this visit.  ? ?No results found. ? ?Review of Systems:   ?A ROS was performed including  pertinent positives and negatives as documented in the HPI. ? ?Physical Exam :   ?Constitutional: NAD and appears stated age ?Neurological: Alert and oriented ?Psych: Appropriate affect and cooperative ?There

## 2022-01-31 NOTE — Therapy (Signed)
?OUTPATIENT PHYSICAL THERAPY LOWER EXTREMITY Treatment /progress  ? ? ?Patient Name: Danielle Rocha ?MRN: [id number] ?DOB:Feb 12, 1951, 71 y.o., female ?Today's Date: 01/31/2022 ? ? PT End of Session - 02/02/22 1525   ? ? Visit Number 9   ? Number of Visits 25   ? Date for PT Re-Evaluation 03/30/22   ? Authorization Type progress note performed at visit 9 next at visit 19   ? PT Start Time 1430   ? PT Stop Time [id number]   Patient had just had shots in her hips  ? PT Time Calculation (min) 25 min   ? Activity Tolerance Patient tolerated treatment well   ? Behavior During Therapy [facility] for tasks assessed/performed   ? ?  ?  ? ?  ?Progress Note ?Reporting Period 12/26/2021 to  01/31/2022 ? ?See note below for Objective Data and Assessment of Progress/Goals.  ? ?  ? ? ? ?Past Medical History:  ?Diagnosis Date  ? Arthritis   ? DJD, low back, thumb  ? Atrial fibrillation ([place])   ? Xarelto anticoagulation. Flecainide antiarrythmic   ? Chicken pox   ? Chronic pain syndrome   ? On disability. History of bilateral hip pain, low back pain. Gabapentin 400 BID, percocet 1 tablet daily per prior provider.   ? Colon polyp   ? awaiting records  ? Depression   ? zoloft 100mg , remeron 30mg  per psychiatry. ambien 10mg  per psychiatry to help wtih sleep element.   ? Dystonia   ? described as psychogenic dystonia. Pain and twisting from upper chest and up with triggers "wind, creamy food" on TID ativan per psychiatry previously.   ? Fibromyalgia   ? GERD (gastroesophageal reflux disease)   ? omeprazole OTC  ? Goiter   ? states multiple imaging tests, has had biopsies  ? Hyperlipidemia   ? lovastatin 20mg   ? Stroke [facility])   ? TIA (left side of face and body decreased sensitivity than right face and side)  ? TIA (transient ischemic attack)   ? Vaginal atrophy   ? estrace vaginal cream  ? ?Past Surgical History:  ?Procedure Laterality Date  ? ATRIAL FIBRILLATION ABLATION    ? BIOPSY THYROID    ? fusion l4-l5  09/22/1998  ? LAMINECTOMY    ? L4-L5  ? OTHER  SURGICAL HISTORY    ? tennis elbow surgery  ? piriformis release  09/22/1997  ? right hip  ? TONSILLECTOMY AND ADENOIDECTOMY  age 74  ? VAGINAL HYSTERECTOMY  09/23/1991  ? ?Patient Active Problem List  ? Diagnosis Date Noted  ? Sore in nose 07/30/2021  ? Involuntary movements 02/14/2020  ? Depression with anxiety 02/14/2020  ? Aortic atherosclerosis ([place]) 02/07/2020  ? Delayed gastric emptying 02/07/2020  ? Dystonia   ? Restless legs 07/27/2018  ? Recurrent UTI 10/07/2016  ? Major depression in full remission ([place]) 09/02/2016  ? GERD (gastroesophageal reflux disease) 01/30/2016  ? History of SI/intentional drug overdose 11/22/2015  ? Atrial fibrillation ([place])   ? Chronic pain syndrome   ? Hyperlipidemia   ? ? ?PCP: Marin Olp, MD ? ?REFERRING PROVIDER:Dr Vanetta Mulders  ? ?REFERRING DIAG: M67.959 (ICD-10-CM) - Tendinopathy of gluteus medius ? ?THERAPY DIAG:  ?Pain in left hip ? ?Pain in right hip ? ?Stiffness of right hip, not elsewhere classified ? ?Other abnormalities of gait and mobility ? ?ONSET DATE: 1998 was the initial onset  ? ?SUBJECTIVE:  ? ?SUBJECTIVE STATEMENT: ?Patient feels like the past week has been pretty good. She has  been able to do a little more. She had injections in her hips and neck a few minutes ago. We will perform a re-assessment on her then we will hold on exercises until the next visit.  ?PERTINENT HISTORY: ?L4-L5 fusion 2000; A-fib; chronic pain syndrome; depression ; dystonia ; fibromyalgia;  ? ?PAIN:  ?Are you having pain? Yes ?NPRS scale:  5/10 - 02/02/22 ( not assessing official pain levels on  a regular basis on this time 2nd to pain focus.)  ?Pain location: lower back, and at the trochanter and goes down the hips  ?Pain description: aching  ?Aggravating factors: Standing/ walking/ sitting  ?Relieving factors: lay down and get on a heating pad and find the right position  ? ?PRECAUTIONS: None ? ?WEIGHT BEARING RESTRICTIONS No ? ?FALLS:  ?Has patient fallen in last 6 months?  Yes. Number of falls was having frequent falls. It was found to be medication related.  ? ?LIVING ENVIRONMENT: ?Steps to the attic ? ?OCCUPATION:  ?Retired  ? ?Hobbies: gardening  ? ?PLOF: Independent ? ?PATIENT GOALS   ?To have less pain. To improve her ability to perform activity.  ? ? ?OBJECTIVE:  ?  ?MMT  Right ?12/27/2021 Left ?12/27/2021 Right  ? Left  ?Hip flexion 6.1 8.3 15.7 10.8  ?Hip extension        ?Hip abduction 6.6 9.6 8.7 15.0  ?Hip adduction        ?Hip internal rotation        ?Hip external rotation        ?Knee flexion        ?Knee extension 7.7 6.5 12.8 13.1  ?Ankle dorsiflexion        ?Ankle plantarflexion        ?Ankle inversion        ?Ankle eversion        ? (Blank rows = not tested) ? ?PROM Right ?12/27/2021 Left ?12/27/2021 Right ?5/12 Left  ?5/12   ?Hip flexion Pain with intial onset of movement  70 degrees  Painful arc but able to get to 90 degrees  95  ?Hip extension        ?Hip abduction        ?Hip adduction        ?Hip internal rotation Significant pain  Significant pain     ?Hip external rotation Significant pain  Significant pain     ?Knee flexion        ?Knee extension        ?Ankle dorsiflexion        ?Ankle plantarflexion        ?Ankle inversion        ?Ankle eversion        ? (Blank rows = not tested) ? ? ?TODAY'S TREATMENT: ?5/12] ?Assessed test and measures  ?Assessed FOTO  ?Talked to patient about plan going forward ?Talked to patient about starting to transition some exercises out of water ? ? ? ?5/8 ?Pt seen for aquatic therapy today.  Treatment took place in water 3.25-4.2 ft in depth at the [facility] pool. Temp of water was 92?.  Pt entered/exited the pool via stairs (step through pattern) independently with bilat rail. ? ? Warm up: forward / backward walking x 3 laps each; side stepping 3  laps ? Slow high knee marching forward, Backwards walking with increased speed- holding yellow noodle  ? Box stepping x6 each direction  ? Holding wall: toe/heel raises x20; squats  x 10 ? Seated: LAQ x  10 each leg with DF of ankle  ?  Lumbar flexion x10 holding kick board  ?  Lumbar lateral flexion x 10 holding kick board on surface for stretch ? Holding rainbow hand buoys under water and forward/ backward walking  ? Rainbow buoy push downs at side x 10 ? Tandem stance x 20 sec, each foot forward x 2 reps ? Forward reaching low back stretch x 10 sec hold ? Hula hoop motion both directions ? Hip abdct/ add x 10 reps, alternating ? Ai Chi: Enclosing, soothing, uplifting - cues to bend at knees and hips to reduce increased lordosis ? Repeated lumbar stretch forward at wall;  walking forward/ backward; sitting on noodle between legs for recovery ? ?Pt requires buoyancy for support and to offload joints with strengthening exercises. Viscosity of the water is needed for resistance of strengthening; water current perturbations provides challenge to standing balance unsupported, requiring increased core activation. ? ? ?PATIENT EDUCATION:  ?Education details: reviewed central nervous system response to pain; self care massage; posture/ lumbar support ?Person educated: Patient ?Education method: Explanation, Demonstration, Tactile cues, Verbal cues ?Education comprehension: verbalized understanding, returned demonstration, verbal cues required, tactile cues required, and needs further education ? ? ?HOME EXERCISE PROGRAM: ?Access Code: [email or web address] ?URL: [email or web address] ?Date: 12/27/2021 ?Prepared by: Carolyne Littles ? ?Exercises ?- Supine Lower Trunk Rotation  - 1 x daily - 7 x weekly - 3 sets - 10 reps ?- Supine Heel Slide  - 1 x daily - 7 x weekly - 3 sets - 10 reps ?- Seated Hamstring Stretch  - 1 x daily - 7 x weekly - 2 sets - 3 reps - 20 sec  hold ? ?ASSESSMENT: ? ?CLINICAL IMPRESSION: ?The patient is making good progress. She has been able to advance her water exercises Her pain levels continue to fluctuate but she is able to do more activity. She had just had her injections today  so no exercises performed. She had improve strength and ROM. Her FOTO score has improved and shows an improvement in overall function. We will continue therapy 2W8.  ?OBJECTIVE IMPAIRMENTS Abnormal gai

## 2022-01-31 NOTE — Telephone Encounter (Signed)
Left voicemail to call pharmacy ?

## 2022-02-02 ENCOUNTER — Encounter (HOSPITAL_BASED_OUTPATIENT_CLINIC_OR_DEPARTMENT_OTHER): Payer: Self-pay | Admitting: Physical Therapy

## 2022-02-03 ENCOUNTER — Other Ambulatory Visit (HOSPITAL_BASED_OUTPATIENT_CLINIC_OR_DEPARTMENT_OTHER): Payer: Self-pay | Admitting: Orthopaedic Surgery

## 2022-02-03 ENCOUNTER — Encounter (HOSPITAL_BASED_OUTPATIENT_CLINIC_OR_DEPARTMENT_OTHER): Payer: Self-pay | Admitting: Physical Therapy

## 2022-02-03 ENCOUNTER — Ambulatory Visit (HOSPITAL_BASED_OUTPATIENT_CLINIC_OR_DEPARTMENT_OTHER): Payer: Medicare HMO | Admitting: Physical Therapy

## 2022-02-03 ENCOUNTER — Telehealth: Payer: Self-pay | Admitting: Orthopaedic Surgery

## 2022-02-03 DIAGNOSIS — M25552 Pain in left hip: Secondary | ICD-10-CM

## 2022-02-03 DIAGNOSIS — R2689 Other abnormalities of gait and mobility: Secondary | ICD-10-CM

## 2022-02-03 DIAGNOSIS — M25651 Stiffness of right hip, not elsewhere classified: Secondary | ICD-10-CM

## 2022-02-03 DIAGNOSIS — M25551 Pain in right hip: Secondary | ICD-10-CM | POA: Diagnosis not present

## 2022-02-03 MED ORDER — METHOCARBAMOL 500 MG PO TABS
500.0000 mg | ORAL_TABLET | Freq: Four times a day (QID) | ORAL | 0 refills | Status: DC
Start: 2022-02-03 — End: 2022-08-18

## 2022-02-03 NOTE — Therapy (Signed)
?OUTPATIENT PHYSICAL THERAPY LOWER EXTREMITY TREATMENT ? ? ?Patient Name: Danielle Rocha ?MRN: [id number] ?DOB:17-Dec-1950, 71 y.o., female ?Today's Date: 01/31/2022 ? ? PT End of Session - 02/03/22 0848   ? ? Visit Number 10   ? Number of Visits 25   ? Date for PT Re-Evaluation 03/30/22   ? Authorization Type progress note performed at visit 9 next at visit 19   ? PT Start Time [phone]   ? PT Stop Time [phone]   ? PT Time Calculation (min) 41 min   ? Activity Tolerance Patient tolerated treatment well   ? Behavior During Therapy [facility] for tasks assessed/performed   ? ?  ?  ? ?  ? ? ? ?Past Medical History:  ?Diagnosis Date  ? Arthritis   ? DJD, low back, thumb  ? Atrial fibrillation ([place])   ? Xarelto anticoagulation. Flecainide antiarrythmic   ? Chicken pox   ? Chronic pain syndrome   ? On disability. History of bilateral hip pain, low back pain. Gabapentin 400 BID, percocet 1 tablet daily per prior provider.   ? Colon polyp   ? awaiting records  ? Depression   ? zoloft 100mg , remeron 30mg  per psychiatry. ambien 10mg  per psychiatry to help wtih sleep element.   ? Dystonia   ? described as psychogenic dystonia. Pain and twisting from upper chest and up with triggers "wind, creamy food" on TID ativan per psychiatry previously.   ? Fibromyalgia   ? GERD (gastroesophageal reflux disease)   ? omeprazole OTC  ? Goiter   ? states multiple imaging tests, has had biopsies  ? Hyperlipidemia   ? lovastatin 20mg   ? Stroke [facility])   ? TIA (left side of face and body decreased sensitivity than right face and side)  ? TIA (transient ischemic attack)   ? Vaginal atrophy   ? estrace vaginal cream  ? ?Past Surgical History:  ?Procedure Laterality Date  ? ATRIAL FIBRILLATION ABLATION    ? BIOPSY THYROID    ? fusion l4-l5  09/22/1998  ? LAMINECTOMY    ? L4-L5  ? OTHER SURGICAL HISTORY    ? tennis elbow surgery  ? piriformis release  09/22/1997  ? right hip  ? TONSILLECTOMY AND ADENOIDECTOMY  age 64  ? VAGINAL HYSTERECTOMY  09/23/1991  ? ?Patient  Active Problem List  ? Diagnosis Date Noted  ? Sore in nose 07/30/2021  ? Involuntary movements 02/14/2020  ? Depression with anxiety 02/14/2020  ? Aortic atherosclerosis ([place]) 02/07/2020  ? Delayed gastric emptying 02/07/2020  ? Dystonia   ? Restless legs 07/27/2018  ? Recurrent UTI 10/07/2016  ? Major depression in full remission ([place]) 09/02/2016  ? GERD (gastroesophageal reflux disease) 01/30/2016  ? History of SI/intentional drug overdose 11/22/2015  ? Atrial fibrillation ([place])   ? Chronic pain syndrome   ? Hyperlipidemia   ? ? ?PCP: Marin Olp, MD ? ?REFERRING PROVIDER:Dr Vanetta Mulders  ? ?REFERRING DIAG: M67.959 (ICD-10-CM) - Tendinopathy of gluteus medius ? ?THERAPY DIAG:  ?Pain in left hip ? ?Pain in right hip ? ?Stiffness of right hip, not elsewhere classified ? ?Other abnormalities of gait and mobility ? ?ONSET DATE: 1998 was the initial onset  ? ?SUBJECTIVE:  ? ?SUBJECTIVE STATEMENT: ?Pt reports that the injections she received on Friday helped.  She has a headache this morning and reports tightness in neck and upper traps.  She states she is supposed to receive Rx of muscle relaxers from Dr today.   ? ?PERTINENT HISTORY: ?L4-L5  fusion 2000; A-fib; chronic pain syndrome; depression ; dystonia ; fibromyalgia;  ? ?PAIN:  ?Are you having pain? Yes ?NPRS scale:  5/10 - 01/31/22 ( not assessing official pain levels on  a regular basis on this time 2nd to pain focus.)  ?Pain location: lower back, and at the trochanter and goes down the hips  ?Pain description: aching  ?Aggravating factors: Standing/ walking/ sitting  ?Relieving factors: lay down and get on a heating pad and find the right position  ? ?PRECAUTIONS: None ? ?WEIGHT BEARING RESTRICTIONS No ? ?FALLS:  ?Has patient fallen in last 6 months? Yes. Number of falls was having frequent falls. It was found to be medication related.  ? ?LIVING ENVIRONMENT: ?Steps to the attic ? ?OCCUPATION:  ?Retired  ? ?Hobbies: gardening  ? ?PLOF:  Independent ? ?PATIENT GOALS   ?To have less pain. To improve her ability to perform activity.  ? ? ?OBJECTIVE:  ?  ?MMT  Right ?12/27/2021 Left ?12/27/2021 Right  ?01/31/22 Left ?01/31/22  ?Hip flexion 6.1 8.3 15.7 10.8  ?Hip extension        ?Hip abduction 6.6 9.6 8.7 15.0  ?Hip adduction        ?Hip internal rotation        ?Hip external rotation        ?Knee flexion        ?Knee extension 7.7 6.5 12.8 13.1  ?Ankle dorsiflexion        ?Ankle plantarflexion        ?Ankle inversion        ?Ankle eversion        ? (Blank rows = not tested) ? ?PROM Right ?12/27/2021 Left ?12/27/2021 Right ?5/12 Left  ?5/12   ?Hip flexion Pain with intial onset of movement  70 degrees  Painful arc but able to get to 90 degrees  95  ?Hip extension        ?Hip abduction        ?Hip adduction        ?Hip internal rotation Significant pain  Significant pain     ?Hip external rotation Significant pain  Significant pain     ?Knee flexion        ?Knee extension        ?Ankle dorsiflexion        ?Ankle plantarflexion        ?Ankle inversion        ?Ankle eversion        ? (Blank rows = not tested) ? ? ?TODAY'S TREATMENT: ?5/15 ?Pt seen for aquatic therapy today.  Treatment took place in water 3.25-4.8 ft in depth at the [employer] pool. Temp of water was 92?.  Pt entered/exited the pool via stairs (step through pattern) independently with bilat rail. ? ? Warm up: forward / backward walking x 3 laps each; side stepping 3  laps - without UE support ? Slow high knee marching forward, Backwards walking holding rainbow dumbbells under water - 3 laps  ? Rainbow buoy push downs at side x 12; forward rows/ scap squeeze x 12 ?  Forward reaching low back stretch x 10 sec hold (knees bent, arms holding wall ) ? Holding wall: toe/heel raises x20; squats x 15; Hip abdct/ add x 10 reps, alternating LE  ? Box stepping x 6 each direction  ?   Sit to/from stand at bench in water with blue step under feet x 10, x 5 - with arms reaching forward, cues for hip  hinge ? Forward step ups x 10 R/L  ? Seated: LAQ x 10 each leg with DF of ankle  ?  Lumbar flexion x10s holding kick board x 3 ?  Lumbar lateral flexion x 10s holding kick board on surface for stretch x3 ?Hamstring stretch x 15s x 2 ?   Forward walking and Braiding without UE support ? ?Pt requires buoyancy for support and to offload joints with strengthening exercises. Viscosity of the water is needed for resistance of strengthening; water current perturbations provides challenge to standing balance unsupported, requiring increased core activation. ? ? ?PATIENT EDUCATION:  ?Education details: reviewed central nervous system response to pain; self care massage; posture/ lumbar support ?Person educated: Patient ?Education method: Explanation, Demonstration, Tactile cues, Verbal cues ?Education comprehension: verbalized understanding, returned demonstration, verbal cues required, tactile cues required, and needs further education ? ? ?HOME EXERCISE PROGRAM: ?Access Code: [email or web address] ?URL: [email or web address] ?Date: 12/27/2021 ?Prepared by: Carolyne Littles ? ?Exercises ?- Supine Lower Trunk Rotation  - 1 x daily - 7 x weekly - 3 sets - 10 reps ?- Supine Heel Slide  - 1 x daily - 7 x weekly - 3 sets - 10 reps ?- Seated Hamstring Stretch  - 1 x daily - 7 x weekly - 2 sets - 3 reps - 20 sec  hold ? ?ASSESSMENT: ? ?CLINICAL IMPRESSION: ?Pt able to resume exercises in water at same intensity as last session, along with some new exercises. She reported no increase in symptoms while in water. She reported reduction of neck/upper trap pain at end of session.  Making steady progress towards goals.  ? ?OBJECTIVE IMPAIRMENTS Abnormal gait, decreased activity tolerance, decreased endurance, difficulty walking, decreased ROM, decreased strength, increased fascial restrictions, increased muscle spasms, and pain.  ? ?ACTIVITY LIMITATIONS cleaning, driving, meal prep, occupation, yard work, and shopping.  ? ?PERSONAL  FACTORS L4-L5 fusion 2000; A-fib; chronic pain syndrome; depression ; dystonia ; fibromyalgia;  are also affecting patient's functional outcome.  ? ? ?REHAB POTENTIAL: Good for goals  ? ?CLINICAL DECISION MAKING: Evolving/modera

## 2022-02-03 NOTE — Telephone Encounter (Signed)
Pt called requesting a prescription of muscle relaxer. Please send to [facility] on [address]. Pt phone number is [phone]. ?

## 2022-02-04 ENCOUNTER — Encounter: Payer: Self-pay | Admitting: Family Medicine

## 2022-02-06 ENCOUNTER — Ambulatory Visit (HOSPITAL_BASED_OUTPATIENT_CLINIC_OR_DEPARTMENT_OTHER): Payer: Medicare HMO | Admitting: Physical Therapy

## 2022-02-06 ENCOUNTER — Encounter (HOSPITAL_BASED_OUTPATIENT_CLINIC_OR_DEPARTMENT_OTHER): Payer: Self-pay | Admitting: Physical Therapy

## 2022-02-06 DIAGNOSIS — M25552 Pain in left hip: Secondary | ICD-10-CM

## 2022-02-06 DIAGNOSIS — M25651 Stiffness of right hip, not elsewhere classified: Secondary | ICD-10-CM | POA: Diagnosis not present

## 2022-02-06 DIAGNOSIS — R2689 Other abnormalities of gait and mobility: Secondary | ICD-10-CM | POA: Diagnosis not present

## 2022-02-06 DIAGNOSIS — M25551 Pain in right hip: Secondary | ICD-10-CM | POA: Diagnosis not present

## 2022-02-06 NOTE — Therapy (Signed)
OUTPATIENT PHYSICAL THERAPY LOWER EXTREMITY TREATMENT   Patient Name: Danielle Rocha MRN: [id number] DOB:06/15/1951, 71 y.o., female Today's Date: 01/31/2022   PT End of Session - 02/06/22 1102     Visit Number 11    Number of Visits 25    Date for PT Re-Evaluation 03/30/22    Authorization Type progress note performed at visit 9 next at visit 19    PT Start Time 1058    PT Stop Time 1141    PT Time Calculation (min) 43 min    Activity Tolerance Patient tolerated treatment well    Behavior During Therapy WFL for tasks assessed/performed              Past Medical History:  Diagnosis Date   Arthritis    DJD, low back, thumb   Atrial fibrillation (HCC)    Xarelto anticoagulation. Flecainide antiarrythmic    Chicken pox    Chronic pain syndrome    On disability. History of bilateral hip pain, low back pain. Gabapentin 400 BID, percocet 1 tablet daily per prior provider.    Colon polyp    awaiting records   Depression    zoloft 126m, remeron 367mper psychiatry. ambien 1038mer psychiatry to help wtih sleep element.    Dystonia    described as psychogenic dystonia. Pain and twisting from upper chest and up with triggers "wind, creamy food" on TID ativan per psychiatry previously.    Fibromyalgia    GERD (gastroesophageal reflux disease)    omeprazole OTC   Goiter    states multiple imaging tests, has had biopsies   Hyperlipidemia    [facility] (left side of face and body decreased sensitivity than right face and side)   TIA (transient ischemic attack)    Vaginal atrophy    estrace vaginal cream   Past Surgical History:  Procedure Laterality Date   ATRIAL FIBRILLATION ABLATION     BIOPSY THYROID     fusion l4-l5  09/22/1998   LAMINECTOMY     L4-L5   OTHER SURGICAL HISTORY     tennis elbow surgery   piriformis release  09/22/1997   right hip   TONSILLECTOMY AND ADENOIDECTOMY  age 89   67AGINAL HYSTERECTOMY  09/23/1991   Patient  Active Problem List   Diagnosis Date Noted   Sore in nose 07/30/2021   Involuntary movements 02/14/2020   Depression with anxiety 02/14/2020   Aortic atherosclerosis (HCC)[place]/18/2021   Delayed gastric emptying 02/07/2020   Dystonia    Restless legs 07/27/2018   Recurrent UTI 10/07/2016   Major depression in full remission (HCC)[place]/08/2016   GERD (gastroesophageal reflux disease) 01/30/2016   History of SI/intentional drug overdose 11/22/2015   Atrial fibrillation (HCC)    Chronic pain syndrome    Hyperlipidemia     PCP: HuntMarin Olp  REFERRING PROVIDER:Dr StevVanetta MuldersEFERRING DIAG: [phone]) - Tendinopathy of gluteus medius  THERAPY DIAG:  Pain in left hip  Pain in right hip  Stiffness of right hip, not elsewhere classified  Other abnormalities of gait and mobility  ONSET DATE: 1998 was the initial onset   SUBJECTIVE:   SUBJECTIVE STATEMENT: Pt reports that her hips feel good today.  Has some tightness in her neck today.  "My balance has improved. I can tell I'm getting better at balancing in the water, using my arms".    PERTINENT HISTORY: L4-L5 fusion 2000; A-fib; chronic pain  syndrome; depression ; dystonia ; fibromyalgia;   PAIN:  Are you having pain? Yes NPRS scale:  6/10 - 02/06/22 ( not assessing official pain levels on  a regular basis on this time 2nd to pain focus.)  Pain location: neck  Pain description: aching  Aggravating factors:  Relieving factors: lay down and get on a heating pad and find the right position   PRECAUTIONS: None  WEIGHT BEARING RESTRICTIONS No  FALLS:  Has patient fallen in last 6 months? Yes. Number of falls was having frequent falls. It was found to be medication related.   LIVING ENVIRONMENT: Steps to the attic  OCCUPATION:  Retired   Hobbies: gardening   PLOF: Independent  PATIENT GOALS   To have less pain. To improve her ability to perform activity.    OBJECTIVE:    MMT   Right 12/27/2021 Left 12/27/2021 Right  01/31/22 Left 01/31/22  Hip flexion 6.1 8.3 15.7 10.8  Hip extension        Hip abduction 6.6 9.6 8.7 15.0  Hip adduction        Hip internal rotation        Hip external rotation        Knee flexion        Knee extension 7.7 6.5 12.8 13.1  Ankle dorsiflexion        Ankle plantarflexion        Ankle inversion        Ankle eversion         (Blank rows = not tested)  PROM Right 12/27/2021 Left 12/27/2021 Right 5/12 Left  5/12   Hip flexion Pain with intial onset of movement  70 degrees  Painful arc but able to get to 90 degrees  95  Hip extension        Hip abduction        Hip adduction        Hip internal rotation Significant pain  Significant pain     Hip external rotation Significant pain  Significant pain     Knee flexion        Knee extension        Ankle dorsiflexion        Ankle plantarflexion        Ankle inversion        Ankle eversion         (Blank rows = not tested)   TODAY'S TREATMENT: Pt seen for aquatic therapy today.  Treatment took place in water 3.25-4.8 ft in depth at the [facility] pool. Temp of water was 92.  Pt entered/exited the pool via stairs (step through pattern) independently with bilat rail.   Warm up: forward / backward walking x 3 laps each; side stepping 3  laps - without UE support  Holding yellow noodle:  Hip abdct x 5 reps, hip flex /ext leg swings x 5 each, row x 10 - 2 sets   Holding rainbow buoys at side:  forward / backward gait   Rainbow buoy push downs at side x 12  Box stepping x 8 each direction with increased speed  Sit to/from stand at bench in water with blue step under feet x 5 x 3 sets  - with arms reaching forward; Lumbar flexion x10s  holding kick board x 3; Lumbar lateral flexion x 10s holding kick board on surface for stretch x3  Alternating toe taps to blue step in 3 ft water  Wall push up/ push offs at 4+  feet depth x 8  Lumbar stretch holding wall  Walking forward/  backward with cervical rotation and cervical lateral flexion   Braiding L/ R   Seated on bench with feet on step, forearms on thighs - cervical flexion to neutral and levator stretch   Pt requires buoyancy for support and to offload joints with strengthening exercises. Viscosity of the water is needed for resistance of strengthening; water current perturbations provides challenge to standing balance unsupported, requiring increased core activation.   PATIENT EDUCATION:  Education details: reviewed central nervous system response to pain; self care massage; posture/ lumbar support Person educated: Patient Education method: Explanation, Demonstration, Tactile cues, Verbal cues Education comprehension: verbalized understanding, returned demonstration, verbal cues required, tactile cues required, and needs further education   HOME EXERCISE PROGRAM: Access Code: [email or web address] URL: [email or web address] Date: 12/27/2021 Prepared by: Carolyne Littles  Exercises - Supine Lower Trunk Rotation  - 1 x daily - 7 x weekly - 3 sets - 10 reps - Supine Heel Slide  - 1 x daily - 7 x weekly - 3 sets - 10 reps - Seated Hamstring Stretch  - 1 x daily - 7 x weekly - 2 sets - 3 reps - 20 sec  hold  ASSESSMENT:  CLINICAL IMPRESSION: Pt able to complete LE exercises with less support (ie: holding yellow noodle instead) with good tolerance.  Walking posture improved.  Minor cues for head position throughout session.  She reported slight increase in neck symptoms while in water (when holding head in more ext than neutral); improved with change in position.   Making steady progress towards goals.   OBJECTIVE IMPAIRMENTS Abnormal gait, decreased activity tolerance, decreased endurance, difficulty walking, decreased ROM, decreased strength, increased fascial restrictions, increased muscle spasms, and pain.   ACTIVITY LIMITATIONS cleaning, driving, meal prep, occupation, yard work, and shopping.   PERSONAL  FACTORS L4-L5 fusion 2000; A-fib; chronic pain syndrome; depression ; dystonia ; fibromyalgia;  are also affecting patient's functional outcome.    REHAB POTENTIAL: Good for goals   CLINICAL DECISION MAKING: Evolving/moderate complexity long standing history of variable pain with exercises and activity   EVALUATION COMPLEXITY: Moderate   GOALS: Goals reviewed with patient? Yes  SHORT TERM GOALS: Target date: 02/27/2022  Patient will increase lumbar flexion by 15 degrees  Baseline: Goal status: not measured today   2.  Patient will increase passive hip flexion to 90 degrees without pain  Baseline:  Goal status: partially met range met but no pain   3.  Patient will increase gross bilateral LE strength by 5 lbs bilateral  Baseline:  Goal status: improved with all motions but not 5 on all ongoing   LONG TERM GOALS: Target date: 03/20/2022  Patient will be independent with a complete exercise program including pool and land exercises Baseline:  Goal status: working on it   2.  Patient will go up and down 5 steps without pain in order to improve mobility in the community  Baseline:  Goal status: will begin working on steps   3.  Patient will garden with <4/10 pain  Baseline:  Goal status: INITIAL    PLAN: PT FREQUENCY: 1-2x/week  PT DURATION: 8 weeks  PLANNED INTERVENTIONS: Therapeutic exercises, Therapeutic activity, Neuromuscular re-education, Balance training, Gait training, Patient/Family education, Joint mobilization, Stair training, Aquatic Therapy, Dry Needling, Electrical stimulation, Cryotherapy, Moist heat, Ultrasound, and Manual therapy  PLAN FOR NEXT SESSION: continue with basic ambulation in the pool; work through low range of motion exercises; progress through graded  exposure to exercises; work on balance exercise. Progress HEP as tolerated.   Kerin Perna, PTA 02/06/22 11:08 AM

## 2022-02-11 ENCOUNTER — Ambulatory Visit (HOSPITAL_BASED_OUTPATIENT_CLINIC_OR_DEPARTMENT_OTHER): Payer: Medicare HMO | Admitting: Physical Therapy

## 2022-02-11 ENCOUNTER — Encounter (HOSPITAL_BASED_OUTPATIENT_CLINIC_OR_DEPARTMENT_OTHER): Payer: Self-pay | Admitting: Physical Therapy

## 2022-02-11 DIAGNOSIS — M25551 Pain in right hip: Secondary | ICD-10-CM | POA: Diagnosis not present

## 2022-02-11 DIAGNOSIS — M25552 Pain in left hip: Secondary | ICD-10-CM

## 2022-02-11 DIAGNOSIS — M25651 Stiffness of right hip, not elsewhere classified: Secondary | ICD-10-CM | POA: Diagnosis not present

## 2022-02-11 DIAGNOSIS — R2689 Other abnormalities of gait and mobility: Secondary | ICD-10-CM | POA: Diagnosis not present

## 2022-02-11 NOTE — Therapy (Signed)
OUTPATIENT PHYSICAL THERAPY LOWER EXTREMITY TREATMENT   Patient Name: Danielle Rocha MRN: [id number] DOB:11/03/1950, 70 y.o., female Today's Date: 01/31/2022   PT End of Session - 02/11/22 0856     Visit Number 12    Number of Visits 25    Date for PT Re-Evaluation 03/30/22    Authorization Type progress note performed at visit 9 next at visit 19    PT Start Time 0849    PT Stop Time 0930    PT Time Calculation (min) 41 min    Activity Tolerance Patient tolerated treatment well    Behavior During Therapy WFL for tasks assessed/performed              Past Medical History:  Diagnosis Date   Arthritis    DJD, low back, thumb   Atrial fibrillation (HCC)    Xarelto anticoagulation. Flecainide antiarrythmic    Chicken pox    Chronic pain syndrome    On disability. History of bilateral hip pain, low back pain. Gabapentin 400 BID, percocet 1 tablet daily per prior provider.    Colon polyp    awaiting records   Depression    zoloft 100mg, remeron 30mg per psychiatry. ambien 10mg per psychiatry to help wtih sleep element.    Dystonia    described as psychogenic dystonia. Pain and twisting from upper chest and up with triggers "wind, creamy food" on TID ativan per psychiatry previously.    Fibromyalgia    GERD (gastroesophageal reflux disease)    omeprazole OTC   Goiter    states multiple imaging tests, has had biopsies   Hyperlipidemia    lovastatin 20mg   Stroke (HCC)    TIA (left side of face and body decreased sensitivity than right face and side)   TIA (transient ischemic attack)    Vaginal atrophy    estrace vaginal cream   Past Surgical History:  Procedure Laterality Date   ATRIAL FIBRILLATION ABLATION     BIOPSY THYROID     fusion l4-l5  09/22/1998   LAMINECTOMY     L4-L5   OTHER SURGICAL HISTORY     tennis elbow surgery   piriformis release  09/22/1997   right hip   TONSILLECTOMY AND ADENOIDECTOMY  age 6   VAGINAL HYSTERECTOMY  09/23/1991   Patient  Active Problem List   Diagnosis Date Noted   Sore in nose 07/30/2021   Involuntary movements 02/14/2020   Depression with anxiety 02/14/2020   Aortic atherosclerosis (HCC) 02/07/2020   Delayed gastric emptying 02/07/2020   Dystonia    Restless legs 07/27/2018   Recurrent UTI 10/07/2016   Major depression in full remission (HCC) 09/02/2016   GERD (gastroesophageal reflux disease) 01/30/2016   History of SI/intentional drug overdose 11/22/2015   Atrial fibrillation (HCC)    Chronic pain syndrome    Hyperlipidemia     PCP: Hunter, Stephen O, MD  REFERRING PROVIDER:Dr Steven Bokshan   REFERRING DIAG: M67.959 (ICD-10-CM) - Tendinopathy of gluteus medius  THERAPY DIAG:  Pain in left hip  Pain in right hip  Stiffness of right hip, not elsewhere classified  Other abnormalities of gait and mobility  ONSET DATE: 1998 was the initial onset   SUBJECTIVE:   SUBJECTIVE STATEMENT: "Today is a good day.  I woke up chipper".    PERTINENT HISTORY: L4-L5 fusion 2000; A-fib; chronic pain syndrome; depression ; dystonia ; fibromyalgia;   PAIN:  Are you having pain? Yes NPRS scale:   ( not assessing official pain levels   on  a regular basis on this time 2nd to pain focus.)  Pain location: bilat ant hips  Pain description: aching  Aggravating factors:  Relieving factors: lay down and get on a heating pad and find the right position   PRECAUTIONS: None  WEIGHT BEARING RESTRICTIONS No  FALLS:  Has patient fallen in last 6 months? Yes. Number of falls was having frequent falls. It was found to be medication related.   LIVING ENVIRONMENT: Steps to the attic  OCCUPATION:  Retired   Hobbies: gardening   PLOF: Independent  PATIENT GOALS   To have less pain. To improve her ability to perform activity.    OBJECTIVE:    MMT  Right 12/27/2021 Left 12/27/2021 Right  01/31/22 Left 01/31/22  Hip flexion 6.1 8.3 15.7 10.8  Hip extension        Hip abduction 6.6 9.6 8.7 15.0  Hip  adduction        Hip internal rotation        Hip external rotation        Knee flexion        Knee extension 7.7 6.5 12.8 13.1  Ankle dorsiflexion        Ankle plantarflexion        Ankle inversion        Ankle eversion         (Blank rows = not tested)  PROM Right 12/27/2021 Left 12/27/2021 Right 5/12 Left  5/12   Hip flexion Pain with intial onset of movement  70 degrees  Painful arc but able to get to 90 degrees  95  Hip extension        Hip abduction        Hip adduction        Hip internal rotation Significant pain  Significant pain     Hip external rotation Significant pain  Significant pain     Knee flexion        Knee extension        Ankle dorsiflexion        Ankle plantarflexion        Ankle inversion        Ankle eversion         (Blank rows = not tested)   TODAY'S TREATMENT: Pt seen for aquatic therapy today.  Treatment took place in water 3.25-4.8 ft in depth at the [facility] pool. Temp of water was 92.  Pt entered/exited the pool via stairs (step through pattern) independently with bilat rail.   Warm up: backward walking x 3 laps each; side stepping 3  laps - without UE support  Holding yellow noodle:  Hip abdct x 5 reps, hip flex /ext leg swings x 5 each, row x 10 - 2 sets   Rainbow buoy push downs at side x 10  Wall push up/ push offs at 4+ feet depth x 10  Lumbar stretch holding wall  Monster walk backwards / forwards (cues for technique and to slow pace)  STS at bench in water with blue step under feet x 8 reps ; x 3 reps (stopped due to increased hip pain)  - with arms reaching forward;  Lumbar flexion x10s  holding kick board x 2; Lumbar lateral flexion x 10s holding kick board on surface for stretch x 2;  seated piriformis stretch x 10s/ glute stretch x 10s x 2 reps each LE  Forward walking for recovery  Squats x 5; lumbar stretch with feet on wall;  squats x 5     Pt requires buoyancy for support and to offload joints with strengthening  exercises. Viscosity of the water is needed for resistance of strengthening; water current perturbations provides challenge to standing balance unsupported, requiring increased core activation.   PATIENT EDUCATION:  Education details: reviewed central nervous system response to pain; self care massage; posture/ lumbar support Person educated: Patient Education method: Explanation, Demonstration, Tactile cues, Verbal cues Education comprehension: verbalized understanding, returned demonstration, verbal cues required, tactile cues required, and needs further education   HOME EXERCISE PROGRAM: Access Code: [email or web address] URL: [email or web address] Date: 12/27/2021 Prepared by: Carolyne Littles  Exercises - Supine Lower Trunk Rotation  - 1 x daily - 7 x weekly - 3 sets - 10 reps - Supine Heel Slide  - 1 x daily - 7 x weekly - 3 sets - 10 reps - Seated Hamstring Stretch  - 1 x daily - 7 x weekly - 2 sets - 3 reps - 20 sec  hold  ASSESSMENT:  CLINICAL IMPRESSION: Pt reported increase in bilat hip pain with forward walking. She had limited tolerance for sit to/from stand today.  Very tight Rt hip ER with seated stretch.  Some cues given to avoid bringing shoulders back too much, increasing lumbar lordosis and hanging on Y ligaments of hips.  Despite elevated discomfort with exercises today, she is making steady progress towards goals.   OBJECTIVE IMPAIRMENTS Abnormal gait, decreased activity tolerance, decreased endurance, difficulty walking, decreased ROM, decreased strength, increased fascial restrictions, increased muscle spasms, and pain.   ACTIVITY LIMITATIONS cleaning, driving, meal prep, occupation, yard work, and shopping.   PERSONAL FACTORS L4-L5 fusion 2000; A-fib; chronic pain syndrome; depression ; dystonia ; fibromyalgia;  are also affecting patient's functional outcome.    REHAB POTENTIAL: Good for goals   CLINICAL DECISION MAKING: Evolving/moderate complexity long  standing history of variable pain with exercises and activity   EVALUATION COMPLEXITY: Moderate   GOALS: Goals reviewed with patient? Yes  SHORT TERM GOALS: Target date: 03/04/2022  Patient will increase lumbar flexion by 15 degrees  Baseline: Goal status: not measured today   2.  Patient will increase passive hip flexion to 90 degrees without pain  Baseline:  Goal status: partially met range met but no pain   3.  Patient will increase gross bilateral LE strength by 5 lbs bilateral  Baseline:  Goal status: improved with all motions but not 5 on all ongoing   LONG TERM GOALS: Target date: 03/25/2022  Patient will be independent with a complete exercise program including pool and land exercises Baseline:  Goal status: working on it   2.  Patient will go up and down 5 steps without pain in order to improve mobility in the community  Baseline:  Goal status: will begin working on steps   3.  Patient will garden with <4/10 pain  Baseline:  Goal status: INITIAL    PLAN: PT FREQUENCY: 1-2x/week  PT DURATION: 8 weeks  PLANNED INTERVENTIONS: Therapeutic exercises, Therapeutic activity, Neuromuscular re-education, Balance training, Gait training, Patient/Family education, Joint mobilization, Stair training, Aquatic Therapy, Dry Needling, Electrical stimulation, Cryotherapy, Moist heat, Ultrasound, and Manual therapy  PLAN FOR NEXT SESSION: continue with basic ambulation in the pool; work through low range of motion exercises; progress through graded exposure to exercises; work on balance exercise. Progress HEP as tolerated.   Kerin Perna, PTA 02/11/22 9:32 AM

## 2022-02-13 ENCOUNTER — Ambulatory Visit (HOSPITAL_BASED_OUTPATIENT_CLINIC_OR_DEPARTMENT_OTHER): Payer: Medicare HMO | Admitting: Physical Therapy

## 2022-02-18 ENCOUNTER — Ambulatory Visit (HOSPITAL_BASED_OUTPATIENT_CLINIC_OR_DEPARTMENT_OTHER): Payer: Medicare HMO | Admitting: Physical Therapy

## 2022-02-18 DIAGNOSIS — R2689 Other abnormalities of gait and mobility: Secondary | ICD-10-CM | POA: Diagnosis not present

## 2022-02-18 DIAGNOSIS — M25552 Pain in left hip: Secondary | ICD-10-CM | POA: Diagnosis not present

## 2022-02-18 DIAGNOSIS — M25651 Stiffness of right hip, not elsewhere classified: Secondary | ICD-10-CM | POA: Diagnosis not present

## 2022-02-18 DIAGNOSIS — M25551 Pain in right hip: Secondary | ICD-10-CM

## 2022-02-18 NOTE — Therapy (Signed)
OUTPATIENT PHYSICAL THERAPY LOWER EXTREMITY TREATMENT   Patient Name: Danielle Rocha MRN: [id number] DOB:[birth year], 71 y.o., female Today's Date: 01/31/2022      Past Medical History:  Diagnosis Date   Arthritis    DJD, low back, thumb   Atrial fibrillation (HCC)    Xarelto anticoagulation. Flecainide antiarrythmic    Chicken pox    Chronic pain syndrome    On disability. History of bilateral hip pain, low back pain. Gabapentin 400 BID, percocet 1 tablet daily per prior provider.    Colon polyp    awaiting records   Depression    zoloft 183m, remeron 358mper psychiatry. ambien 104mer psychiatry to help wtih sleep element.    Dystonia    described as psychogenic dystonia. Pain and twisting from upper chest and up with triggers "wind, creamy food" on TID ativan per psychiatry previously.    Fibromyalgia    GERD (gastroesophageal reflux disease)    omeprazole OTC   Goiter    states multiple imaging tests, has had biopsies   Hyperlipidemia    [facility] (left side of face and body decreased sensitivity than right face and side)   TIA (transient ischemic attack)    Vaginal atrophy    estrace vaginal cream   Past Surgical History:  Procedure Laterality Date   ATRIAL FIBRILLATION ABLATION     BIOPSY THYROID     fusion l4-l5  09/22/1998   LAMINECTOMY     L4-L5   OTHER SURGICAL HISTORY     tennis elbow surgery   piriformis release  09/22/1997   right hip   TONSILLECTOMY AND ADENOIDECTOMY  age 84   22AGINAL HYSTERECTOMY  09/23/1991   Patient Active Problem List   Diagnosis Date Noted   Sore in nose 07/30/2021   Involuntary movements 02/14/2020   Depression with anxiety 02/14/2020   Aortic atherosclerosis (HCC)[place]/18/2021   Delayed gastric emptying 02/07/2020   Dystonia    Restless legs 07/27/2018   Recurrent UTI 10/07/2016   Major depression in full remission (HCC)[place]/08/2016   GERD (gastroesophageal reflux disease) 01/30/2016    History of SI/intentional drug overdose 11/22/2015   Atrial fibrillation (HCC)    Chronic pain syndrome    Hyperlipidemia     PCP: HuntMarin Olp  REFERRING PROVIDER:Dr StevVanetta MuldersEFERRING DIAG: M67.463 [id number]) - Tendinopathy of gluteus medius  THERAPY DIAG:  No diagnosis found.  ONSET DATE: 1998 was the initial onset   SUBJECTIVE:   SUBJECTIVE STATEMENT: Patient reports she felt pretty good this morning. She did a lot of walking yesterday and was sore. Overall she feels like she Is making progress. She is having less bad days.   PERTINENT HISTORY: L4-L5 fusion 2000; A-fib; chronic pain syndrome; depression ; dystonia ; fibromyalgia;   PAIN:  Are you having pain? Yes NPRS scale:   ( not assessing official pain levels on  a regular basis on this time 2nd to pain focus.)  Pain location: bilat ant hips  Pain description: aching  Aggravating factors:  Relieving factors: lay down and get on a heating pad and find the right position   PRECAUTIONS: None  WEIGHT BEARING RESTRICTIONS No  FALLS:  Has patient fallen in last 6 months? Yes. Number of falls was having frequent falls. It was found to be medication related.   LIVING ENVIRONMENT: Steps to the attic  OCCUPATION:  Retired   Hobbies: gardening   PLOF: Independent  PATIENT GOALS   To have less pain. To improve her ability to perform activity.    OBJECTIVE:    MMT  Right 12/27/2021 Left 12/27/2021 Right  01/31/22 Left 01/31/22  Hip flexion 6.1 8.3 15.7 10.8  Hip extension        Hip abduction 6.6 9.6 8.7 15.0  Hip adduction        Hip internal rotation        Hip external rotation        Knee flexion        Knee extension 7.7 6.5 12.8 13.1  Ankle dorsiflexion        Ankle plantarflexion        Ankle inversion        Ankle eversion         (Blank rows = not tested)  PROM Right 12/27/2021 Left 12/27/2021 Right 5/12 Left  5/12   Hip flexion Pain with intial onset of movement  70 degrees   Painful arc but able to get to 90 degrees  95  Hip extension        Hip abduction        Hip adduction        Hip internal rotation Significant pain  Significant pain     Hip external rotation Significant pain  Significant pain     Knee flexion        Knee extension        Ankle dorsiflexion        Ankle plantarflexion        Ankle inversion        Ankle eversion         (Blank rows = not tested)   TODAY'S TREATMENT:    5/30  Pt seen for aquatic therapy today.  Treatment took place in water 3.25-4.8 ft in depth at the [facility] pool. Temp of water was 90.  Pt entered/exited the pool via stairs (step through pattern) independently with bilat rail.   Warm up: backward walking x 3 laps each; side stepping 3  laps - without UE support  Holding  wall   Hip abdct x 10 reps, hip flex /ext leg swings x10each, heel raise x20   Rainbow buoy push downs at side x 10  Wall push up/ push offs at 4+ feet depth x 10 Kick board forward stretch x10 5 sec hold    Rainbow weights:  Standing: shoulder flexion 2x10  Winging 2x10  Top water quick extension 2x10   Balance:  Box step 10x each way tandem stance 2x30 each foot forward  Narrow base eyes open 1x     Pt requires buoyancy for support and to offload joints with strengthening exercises. Viscosity of the water is needed for resistance of strengthening; water current perturbations provides challenge to standing balance unsupported, requiring increased core activation.   Last Visit      Pt requires buoyancy for support and to offload joints with strengthening exercises. Viscosity of the water is needed for resistance of strengthening; water current perturbations provides challenge to standing balance unsupported, requiring increased core activation.  Pt seen for aquatic therapy today.  Treatment took place in water 3.25-4.8 ft in depth at the [facility] pool. Temp of water was 92.  Pt entered/exited the pool via  stairs (step through pattern) independently with bilat rail.   Warm up: backward walking x 3 laps each; side stepping 3  laps - without UE support  Holding yellow noodle:  Hip abdct x 5 reps, hip flex /ext leg swings x 5 each, row x 10 - 2 sets   Rainbow buoy push downs at side x 10  Wall push up/ push offs at 4+ feet depth x 10  Lumbar stretch holding wall  Monster walk backwards / forwards (cues for technique and to slow pace)  STS at bench in water with blue step under feet x 8 reps ; x 3 reps (stopped due to increased hip pain)  - with arms reaching forward;  Lumbar flexion x10s  holding kick board x 2; Lumbar lateral flexion x 10s holding kick board on surface for stretch x 2;  seated piriformis stretch x 10s/ glute stretch x 10s x 2 reps each LE  Forward walking for recovery  Squats x 5; lumbar stretch with feet on wall;  squats x 5     Pt requires buoyancy for support and to offload joints with strengthening exercises. Viscosity of the water is needed for resistance of strengthening; water current perturbations provides challenge to standing balance unsupported, requiring increased core activation.   PATIENT EDUCATION:  Education details: reviewed central nervous system response to pain; self care massage; posture/ lumbar support Person educated: Patient Education method: Explanation, Demonstration, Tactile cues, Verbal cues Education comprehension: verbalized understanding, returned demonstration, verbal cues required, tactile cues required, and needs further education   HOME EXERCISE PROGRAM: Access Code: [email or web address] URL: [email or web address] Date: 12/27/2021 Prepared by: Carolyne Littles  Exercises - Supine Lower Trunk Rotation  - 1 x daily - 7 x weekly - 3 sets - 10 reps - Supine Heel Slide  - 1 x daily - 7 x weekly - 3 sets - 10 reps - Seated Hamstring Stretch  - 1 x daily - 7 x weekly - 2 sets - 3 reps - 20 sec  hold  ASSESSMENT:  CLINICAL IMPRESSION Patient  continues to make good progress. She had minimal pain towards the end of the visit. She worked on Midwife as well as core stability. She will be given a laminated copy of her exercises. She was encouraged to start going to the [facility] and start to develop the program that she is going to work on for some time.   OBJECTIVE IMPAIRMENTS Abnormal gait, decreased activity tolerance, decreased endurance, difficulty walking, decreased ROM, decreased strength, increased fascial restrictions, increased muscle spasms, and pain.   ACTIVITY LIMITATIONS cleaning, driving, meal prep, occupation, yard work, and shopping.   PERSONAL FACTORS L4-L5 fusion 2000; A-fib; chronic pain syndrome; depression ; dystonia ; fibromyalgia;  are also affecting patient's functional outcome.    REHAB POTENTIAL: Good for goals   CLINICAL DECISION MAKING: Evolving/moderate complexity long standing history of variable pain with exercises and activity   EVALUATION COMPLEXITY: Moderate   GOALS: Goals reviewed with patient? Yes  SHORT TERM GOALS: Target date: 03/11/2022  Patient will increase lumbar flexion by 15 degrees  Baseline: Goal status: not measured today   2.  Patient will increase passive hip flexion to 90 degrees without pain  Baseline:  Goal status: partially met range met but no pain   3.  Patient will increase gross bilateral LE strength by 5 lbs bilateral  Baseline:  Goal status: improved with all motions but not 5 on all ongoing   LONG TERM GOALS: Target date: 04/01/2022  Patient will be independent with a complete exercise program including pool and land exercises Baseline:  Goal status: working on it   2.  Patient will go up and  down 5 steps without pain in order to improve mobility in the community  Baseline:  Goal status: will begin working on steps   3.  Patient will garden with <4/10 pain  Baseline:  Goal status: INITIAL    PLAN: PT FREQUENCY: 1-2x/week  PT DURATION: 8  weeks  PLANNED INTERVENTIONS: Therapeutic exercises, Therapeutic activity, Neuromuscular re-education, Balance training, Gait training, Patient/Family education, Joint mobilization, Stair training, Aquatic Therapy, Dry Needling, Electrical stimulation, Cryotherapy, Moist heat, Ultrasound, and Manual therapy  PLAN FOR NEXT SESSION: continue with basic ambulation in the pool; work through low range of motion exercises; progress through graded exposure to exercises; work on balance exercise. Progress HEP as tolerated.   Carolyne Littles PT DPT  02/18/22 10:22 AM

## 2022-02-24 ENCOUNTER — Encounter (HOSPITAL_BASED_OUTPATIENT_CLINIC_OR_DEPARTMENT_OTHER): Payer: Self-pay | Admitting: Physical Therapy

## 2022-02-24 ENCOUNTER — Ambulatory Visit (HOSPITAL_BASED_OUTPATIENT_CLINIC_OR_DEPARTMENT_OTHER): Payer: Medicare HMO | Attending: Orthopaedic Surgery | Admitting: Physical Therapy

## 2022-02-24 DIAGNOSIS — M25552 Pain in left hip: Secondary | ICD-10-CM | POA: Diagnosis not present

## 2022-02-24 DIAGNOSIS — M25651 Stiffness of right hip, not elsewhere classified: Secondary | ICD-10-CM | POA: Diagnosis not present

## 2022-02-24 DIAGNOSIS — R2689 Other abnormalities of gait and mobility: Secondary | ICD-10-CM | POA: Diagnosis not present

## 2022-02-24 DIAGNOSIS — M25551 Pain in right hip: Secondary | ICD-10-CM | POA: Diagnosis not present

## 2022-02-24 NOTE — Therapy (Signed)
OUTPATIENT PHYSICAL THERAPY LOWER EXTREMITY TREATMENT   Patient Name: Kaisyn Reinhold MRN: [id number] DOB:1951-03-19, 71 y.o., female Today's Date: 01/31/2022   PT End of Session - 02/24/22 0855     Visit Number 14    Number of Visits 25    Date for PT Re-Evaluation 03/30/22    Authorization Type progress note performed at visit 9 next at visit 30    PT Start Time 0845    PT Stop Time 0925    PT Time Calculation (min) 40 min    Activity Tolerance Patient tolerated treatment well    Behavior During Therapy WFL for tasks assessed/performed               Past Medical History:  Diagnosis Date   Arthritis    DJD, low back, thumb   Atrial fibrillation (HCC)    Xarelto anticoagulation. Flecainide antiarrythmic    Chicken pox    Chronic pain syndrome    On disability. History of bilateral hip pain, low back pain. Gabapentin 400 BID, percocet 1 tablet daily per prior provider.    Colon polyp    awaiting records   Depression    zoloft 111m, remeron 337mper psychiatry. ambien 1074mer psychiatry to help wtih sleep element.    Dystonia    described as psychogenic dystonia. Pain and twisting from upper chest and up with triggers "wind, creamy food" on TID ativan per psychiatry previously.    Fibromyalgia    GERD (gastroesophageal reflux disease)    omeprazole OTC   Goiter    states multiple imaging tests, has had biopsies   Hyperlipidemia    [facility] (left side of face and body decreased sensitivity than right face and side)   TIA (transient ischemic attack)    Vaginal atrophy    estrace vaginal cream   Past Surgical History:  Procedure Laterality Date   ATRIAL FIBRILLATION ABLATION     BIOPSY THYROID     fusion l4-l5  09/22/1998   LAMINECTOMY     L4-L5   OTHER SURGICAL HISTORY     tennis elbow surgery   piriformis release  09/22/1997   right hip   TONSILLECTOMY AND ADENOIDECTOMY  age 89   30AGINAL HYSTERECTOMY  09/23/1991   Patient  Active Problem List   Diagnosis Date Noted   Sore in nose 07/30/2021   Involuntary movements 02/14/2020   Depression with anxiety 02/14/2020   Aortic atherosclerosis (HCC)[place]/18/2021   Delayed gastric emptying 02/07/2020   Dystonia    Restless legs 07/27/2018   Recurrent UTI 10/07/2016   Major depression in full remission (HCC)[place]/08/2016   GERD (gastroesophageal reflux disease) 01/30/2016   History of SI/intentional drug overdose 11/22/2015   Atrial fibrillation (HCC)    Chronic pain syndrome    Hyperlipidemia     PCP: HuntMarin Olp  REFERRING PROVIDER:Dr StevVanetta MuldersEFERRING DIAG: [id number]) - Tendinopathy of gluteus medius  THERAPY DIAG:  Pain in left hip  Pain in right hip  Stiffness of right hip, not elsewhere classified  Other abnormalities of gait and mobility  ONSET DATE: 1998 was the initial onset   SUBJECTIVE:   SUBJECTIVE STATEMENT: Patient reports she has been having migraines over the weekend. This happens from time to time.  PERTINENT HISTORY: L4-L5 fusion 2000; A-fib; chronic pain syndrome; depression ; dystonia ; fibromyalgia;   PAIN:  Are you having pain? Yes NPRS scale:   (  not assessing official pain levels on  a regular basis on this time 2nd to pain focus.)  Pain location: bilat ant hips  Pain description: aching  Aggravating factors:  Relieving factors: lay down and get on a heating pad and find the right position   PRECAUTIONS: None  WEIGHT BEARING RESTRICTIONS No  FALLS:  Has patient fallen in last 6 months? Yes. Number of falls was having frequent falls. It was found to be medication related.   LIVING ENVIRONMENT: Steps to the attic  OCCUPATION:  Retired   Hobbies: gardening   PLOF: Independent  PATIENT GOALS   To have less pain. To improve her ability to perform activity.    OBJECTIVE:    MMT  Right 12/27/2021 Left 12/27/2021 Right  01/31/22 Left 01/31/22  Hip flexion 6.1 8.3 15.7 10.8  Hip  extension        Hip abduction 6.6 9.6 8.7 15.0  Hip adduction        Hip internal rotation        Hip external rotation        Knee flexion        Knee extension 7.7 6.5 12.8 13.1  Ankle dorsiflexion        Ankle plantarflexion        Ankle inversion        Ankle eversion         (Blank rows = not tested)  PROM Right 12/27/2021 Left 12/27/2021 Right 5/12 Left  5/12   Hip flexion Pain with intial onset of movement  70 degrees  Painful arc but able to get to 90 degrees  95  Hip extension        Hip abduction        Hip adduction        Hip internal rotation Significant pain  Significant pain     Hip external rotation Significant pain  Significant pain     Knee flexion        Knee extension        Ankle dorsiflexion        Ankle plantarflexion        Ankle inversion        Ankle eversion         (Blank rows = not tested)   TODAY'S TREATMENT: 6/5 Pt seen for aquatic therapy today.  Treatment took place in water 3.25-4.8 ft in depth at the [facility] pool. Temp of water was 90.  Pt entered/exited the pool via stairs (step through pattern) independently with bilat rail.   Warm up: backward walking x 3 laps each; side stepping 3  laps - without UE support  Holding  wall   Hip abdct x 10 reps, hip flex /ext leg swings x10each, heel raise x20   Rainbow buoy push downs at side x 10  Wall push up/ push offs at 4+ feet depth x 10 Kick board forward stretch x10 5 sec hold    Rainbow weights:  Standing: shoulder flexion 2x10  Winging 2x10  Top water quick extension 2x10   Balance:  Box step 10x each way tandem stance 2x30 each foot forward  Narrow base eyes open 1x     Pt requires buoyancy for support and to offload joints with strengthening exercises. Viscosity of the water is needed for resistance of strengthening; water current perturbations provides challenge to standing balance unsupported, requiring increased core activation.   5/30  Pt seen for aquatic  therapy today.  Treatment  took place in water 3.25-4.8 ft in depth at the [place]. Temp of water was 90.  Pt entered/exited the pool via stairs (step through pattern) independently with bilat rail.   Warm up: backward walking x 3 laps each; side stepping 3  laps - without UE support  Holding  wall   Hip abdct x 10 reps, hip flex /ext leg swings x10each, heel raise x20   Rainbow buoy push downs at side x 10  Wall push up/ push offs at 4+ feet depth x 10 Kick board forward stretch x10 5 sec hold    Rainbow weights:  Standing: shoulder flexion 2x10  Winging 2x10  Top water quick extension 2x10   Balance:  Box step 10x each way tandem stance 2x30 each foot forward  Narrow base eyes open 1x     Pt requires buoyancy for support and to offload joints with strengthening exercises. Viscosity of the water is needed for resistance of strengthening; water current perturbations provides challenge to standing balance unsupported, requiring increased core activation.   Last Visit      Pt requires buoyancy for support and to offload joints with strengthening exercises. Viscosity of the water is needed for resistance of strengthening; water current perturbations provides challenge to standing balance unsupported, requiring increased core activation.  Pt seen for aquatic therapy today.  Treatment took place in water 3.25-4.8 ft in depth at the [facility] pool. Temp of water was 92.  Pt entered/exited the pool via stairs (step through pattern) independently with bilat rail.   Warm up: backward walking x 3 laps each; side stepping 3  laps - without UE support  Holding yellow noodle:  Hip abdct x 5 reps, hip flex /ext leg swings x 5 each, row x 10 - 2 sets   Rainbow buoy push downs at side x 10  Wall push up/ push offs at 4+ feet depth x 10  Lumbar stretch holding wall  Monster walk backwards / forwards (cues for technique and to slow pace)  STS at bench in water with blue  step under feet x 8 reps ; x 3 reps (stopped due to increased hip pain)  - with arms reaching forward;  Lumbar flexion x10s  holding kick board x 2; Lumbar lateral flexion x 10s holding kick board on surface for stretch x 2;  seated piriformis stretch x 10s/ glute stretch x 10s x 2 reps each LE  Forward walking for recovery  Squats x 5; lumbar stretch with feet on wall;  squats x 5     Pt requires buoyancy for support and to offload joints with strengthening exercises. Viscosity of the water is needed for resistance of strengthening; water current perturbations provides challenge to standing balance unsupported, requiring increased core activation.   PATIENT EDUCATION:  Education details: reviewed central nervous system response to pain; self care massage; posture/ lumbar support Person educated: Patient Education method: Explanation, Demonstration, Tactile cues, Verbal cues Education comprehension: verbalized understanding, returned demonstration, verbal cues required, tactile cues required, and needs further education   HOME EXERCISE PROGRAM: Access Code: [email or web address] URL: [email or web address] Date: 12/27/2021 Prepared by: Carolyne Littles  Exercises - Supine Lower Trunk Rotation  - 1 x daily - 7 x weekly - 3 sets - 10 reps - Supine Heel Slide  - 1 x daily - 7 x weekly - 3 sets - 10 reps - Seated Hamstring Stretch  - 1 x daily - 7 x weekly - 2 sets - 3 reps -  20 sec  hold  ASSESSMENT:  CLINICAL IMPRESSION The patient tolerated exercises well today. She was given a laminated HEP. We reviewed how to  OBJECTIVE IMPAIRMENTS Abnormal gait, decreased activity tolerance, decreased endurance, difficulty walking, decreased ROM, decreased strength, increased fascial restrictions, increased muscle spasms, and pain.   ACTIVITY LIMITATIONS cleaning, driving, meal prep, occupation, yard work, and shopping.   PERSONAL FACTORS L4-L5 fusion 2000; A-fib; chronic pain syndrome; depression ;  dystonia ; fibromyalgia;  are also affecting patient's functional outcome.    REHAB POTENTIAL: Good for goals   CLINICAL DECISION MAKING: Evolving/moderate complexity long standing history of variable pain with exercises and activity   EVALUATION COMPLEXITY: Moderate   GOALS: Goals reviewed with patient? Yes  SHORT TERM GOALS: Target date: 03/17/2022  Patient will increase lumbar flexion by 15 degrees  Baseline: Goal status: not measured today   2.  Patient will increase passive hip flexion to 90 degrees without pain  Baseline:  Goal status: partially met range met but no pain   3.  Patient will increase gross bilateral LE strength by 5 lbs bilateral  Baseline:  Goal status: improved with all motions but not 5 on all ongoing   LONG TERM GOALS: Target date: 04/07/2022  Patient will be independent with a complete exercise program including pool and land exercises Baseline:  Goal status: working on it   2.  Patient will go up and down 5 steps without pain in order to improve mobility in the community  Baseline:  Goal status: will begin working on steps   3.  Patient will garden with <4/10 pain  Baseline:  Goal status: INITIAL    PLAN: PT FREQUENCY: 1-2x/week  PT DURATION: 8 weeks  PLANNED INTERVENTIONS: Therapeutic exercises, Therapeutic activity, Neuromuscular re-education, Balance training, Gait training, Patient/Family education, Joint mobilization, Stair training, Aquatic Therapy, Dry Needling, Electrical stimulation, Cryotherapy, Moist heat, Ultrasound, and Manual therapy  PLAN FOR NEXT SESSION: continue with basic ambulation in the pool; work through low range of motion exercises; progress through graded exposure to exercises; work on balance exercise. Progress HEP as tolerated. Next visit review how migraines can be effected by muscles Review trigger points.   Carolyne Littles PT DPT  02/24/22 8:57 AM

## 2022-02-27 ENCOUNTER — Ambulatory Visit (HOSPITAL_BASED_OUTPATIENT_CLINIC_OR_DEPARTMENT_OTHER): Payer: Medicare HMO | Admitting: Physical Therapy

## 2022-02-27 DIAGNOSIS — M25552 Pain in left hip: Secondary | ICD-10-CM

## 2022-02-27 DIAGNOSIS — R2689 Other abnormalities of gait and mobility: Secondary | ICD-10-CM

## 2022-02-27 DIAGNOSIS — M25551 Pain in right hip: Secondary | ICD-10-CM

## 2022-02-27 DIAGNOSIS — M25651 Stiffness of right hip, not elsewhere classified: Secondary | ICD-10-CM

## 2022-02-27 NOTE — Therapy (Incomplete)
OUTPATIENT PHYSICAL THERAPY LOWER EXTREMITY TREATMENT   Patient Name: Danielle Rocha MRN: [id number] DOB:[birth year], 71 y.o., female Today's Date: 01/31/2022   PT End of Session - 02/27/22 1000     Visit Number 15    Number of Visits 25    Date for PT Re-Evaluation 03/30/22    Authorization Type progress note performed at visit 9 next at visit 30    PT Start Time 0845    PT Stop Time 0928    PT Time Calculation (min) 43 min                Past Medical History:  Diagnosis Date   Arthritis    DJD, low back, thumb   Atrial fibrillation (HCC)    Xarelto anticoagulation. Flecainide antiarrythmic    Chicken pox    Chronic pain syndrome    On disability. History of bilateral hip pain, low back pain. Gabapentin 400 BID, percocet 1 tablet daily per prior provider.    Colon polyp    awaiting records   Depression    zoloft 175m, remeron 378mper psychiatry. ambien 1049mer psychiatry to help wtih sleep element.    Dystonia    described as psychogenic dystonia. Pain and twisting from upper chest and up with triggers "wind, creamy food" on TID ativan per psychiatry previously.    Fibromyalgia    GERD (gastroesophageal reflux disease)    omeprazole OTC   Goiter    states multiple imaging tests, has had biopsies   Hyperlipidemia    [facility] (left side of face and body decreased sensitivity than right face and side)   TIA (transient ischemic attack)    Vaginal atrophy    estrace vaginal cream   Past Surgical History:  Procedure Laterality Date   ATRIAL FIBRILLATION ABLATION     BIOPSY THYROID     fusion l4-l5  09/22/1998   LAMINECTOMY     L4-L5   OTHER SURGICAL HISTORY     tennis elbow surgery   piriformis release  09/22/1997   right hip   TONSILLECTOMY AND ADENOIDECTOMY  age 64   6AGINAL HYSTERECTOMY  09/23/1991   Patient Active Problem List   Diagnosis Date Noted   Sore in nose 07/30/2021   Involuntary movements 02/14/2020    Depression with anxiety 02/14/2020   Aortic atherosclerosis (HCC)[place]/18/2021   Delayed gastric emptying 02/07/2020   Dystonia    Restless legs 07/27/2018   Recurrent UTI 10/07/2016   Major depression in full remission (HCC)[place]/08/2016   GERD (gastroesophageal reflux disease) 01/30/2016   History of SI/intentional drug overdose 11/22/2015   Atrial fibrillation (HCC)    Chronic pain syndrome    Hyperlipidemia     PCP: HuntMarin Olp  REFERRING PROVIDER:Dr StevVanetta MuldersEFERRING DIAG: [id number]) - Tendinopathy of gluteus medius  THERAPY DIAG:  No diagnosis found.  ONSET DATE: 1998 was the initial onset   SUBJECTIVE:   SUBJECTIVE STATEMENT: Patient reports an increase in migraines over the past several days; bil hip pain has been improving.  PERTINENT HISTORY: L4-L5 fusion 2000; A-fib; chronic pain syndrome; depression ; dystonia ; fibromyalgia;   PAIN:  Are you having pain? Yes NPRS scale:   ( not assessing official pain levels on  a regular basis on this time 2nd to pain focus.)  Pain location: bilat ant hips  Pain description: aching  Aggravating factors:  Relieving factors: lay down and get on  a heating pad and find the right position   PRECAUTIONS: None  WEIGHT BEARING RESTRICTIONS No  FALLS:  Has patient fallen in last 6 months? Yes. Number of falls was having frequent falls. It was found to be medication related.   LIVING ENVIRONMENT: Steps to the attic  OCCUPATION:  Retired   Hobbies: gardening   PLOF: Independent  PATIENT GOALS   To have less pain. To improve her ability to perform activity.    OBJECTIVE:    MMT  Right 12/27/2021 Left 12/27/2021 Right  01/31/22 Left 01/31/22  Hip flexion 6.1 8.3 15.7 10.8  Hip extension        Hip abduction 6.6 9.6 8.7 15.0  Hip adduction        Hip internal rotation        Hip external rotation        Knee flexion        Knee extension 7.7 6.5 12.8 13.1  Ankle dorsiflexion        Ankle  plantarflexion        Ankle inversion        Ankle eversion         (Blank rows = not tested)  PROM Right 12/27/2021 Left 12/27/2021 Right 5/12 Left  5/12   Hip flexion Pain with intial onset of movement  70 degrees  Painful arc but able to get to 90 degrees  95  Hip extension        Hip abduction        Hip adduction        Hip internal rotation Significant pain  Significant pain     Hip external rotation Significant pain  Significant pain     Knee flexion        Knee extension        Ankle dorsiflexion        Ankle plantarflexion        Ankle inversion        Ankle eversion         (Blank rows = not tested)   TODAY'S TREATMENT:   6/8 Pt seen for aquatic therapy today.  Treatment took place in water 3.25-4.8 ft in depth at the [facility] pool. Temp of water was 90.  Pt entered/exited the pool via stairs (step through pattern) independently with bilat rail.  Warm up: forward walking, backward walking, side-stepping x3 laps each Holding wall:  - Hip flex/ext leg swings, 2x10  - Heel/toe raise 2x10, balance focus Rainbow weights - row 2x10, shoulder extension 2x10, alternating shld add - focus on core activation and stability Stepping progression - step ups, step up w/leg drive, side step ups x10 Seated kickboard forward flexion, rotation stretching  Pt requires buoyancy for support and to offload joints with strengthening exercises. Viscosity of the water is needed for resistance of strengthening; water current perturbations provides challenge to standing balance unsupported, requiring increased core activation.   6/5 Pt seen for aquatic therapy today.  Treatment took place in water 3.25-4.8 ft in depth at the [facility] pool. Temp of water was 90.  Pt entered/exited the pool via stairs (step through pattern) independently with bilat rail.   Warm up: backward walking x 3 laps each; side stepping 3  laps - without UE support  Holding  wall   Hip abdct x  10 reps, hip flex /ext leg swings x10each, heel raise x20   Rainbow buoy push downs at side x 10  Wall push up/  push offs at 4+ feet depth x 10 Kick board forward stretch x10 5 sec hold    Rainbow weights:  Standing: shoulder flexion 2x10  Winging 2x10  Top water quick extension 2x10   Balance:  Box step 10x each way tandem stance 2x30 each foot forward  Narrow base eyes open 1x     Pt requires buoyancy for support and to offload joints with strengthening exercises. Viscosity of the water is needed for resistance of strengthening; water current perturbations provides challenge to standing balance unsupported, requiring increased core activation.   5/30  Pt seen for aquatic therapy today.  Treatment took place in water 3.25-4.8 ft in depth at the [facility] pool. Temp of water was 90.  Pt entered/exited the pool via stairs (step through pattern) independently with bilat rail.   Warm up: backward walking x 3 laps each; side stepping 3  laps - without UE support  Holding  wall   Hip abdct x 10 reps, hip flex /ext leg swings x10each, heel raise x20   Rainbow buoy push downs at side x 10  Wall push up/ push offs at 4+ feet depth x 10 Kick board forward stretch x10 5 sec hold    Rainbow weights:  Standing: shoulder flexion 2x10  Winging 2x10  Top water quick extension 2x10   Balance:  Box step 10x each way tandem stance 2x30 each foot forward  Narrow base eyes open 1x     Pt requires buoyancy for support and to offload joints with strengthening exercises. Viscosity of the water is needed for resistance of strengthening; water current perturbations provides challenge to standing balance unsupported, requiring increased core activation.   Last Visit      Pt requires buoyancy for support and to offload joints with strengthening exercises. Viscosity of the water is needed for resistance of strengthening; water current perturbations provides challenge to standing balance  unsupported, requiring increased core activation.  Pt seen for aquatic therapy today.  Treatment took place in water 3.25-4.8 ft in depth at the [facility] pool. Temp of water was 92.  Pt entered/exited the pool via stairs (step through pattern) independently with bilat rail.   Warm up: backward walking x 3 laps each; side stepping 3  laps - without UE support  Holding yellow noodle:  Hip abdct x 5 reps, hip flex /ext leg swings x 5 each, row x 10 - 2 sets   Rainbow buoy push downs at side x 10  Wall push up/ push offs at 4+ feet depth x 10  Lumbar stretch holding wall  Monster walk backwards / forwards (cues for technique and to slow pace)  STS at bench in water with blue step under feet x 8 reps ; x 3 reps (stopped due to increased hip pain)  - with arms reaching forward;  Lumbar flexion x10s  holding kick board x 2; Lumbar lateral flexion x 10s holding kick board on surface for stretch x 2;  seated piriformis stretch x 10s/ glute stretch x 10s x 2 reps each LE  Forward walking for recovery  Squats x 5; lumbar stretch with feet on wall;  squats x 5     Pt requires buoyancy for support and to offload joints with strengthening exercises. Viscosity of the water is needed for resistance of strengthening; water current perturbations provides challenge to standing balance unsupported, requiring increased core activation.   PATIENT EDUCATION:  Education details: reviewed central nervous system response to pain; self care massage;  posture/ lumbar support Person educated: Patient Education method: Explanation, Demonstration, Tactile cues, Verbal cues Education comprehension: verbalized understanding, returned demonstration, verbal cues required, tactile cues required, and needs further education   HOME EXERCISE PROGRAM: Access Code: [email or web address] URL: [email or web address] Date: 12/27/2021 Prepared by: Carolyne Littles  Exercises - Supine Lower Trunk Rotation  - 1 x daily - 7  x weekly - 3 sets - 10 reps - Supine Heel Slide  - 1 x daily - 7 x weekly - 3 sets - 10 reps - Seated Hamstring Stretch  - 1 x daily - 7 x weekly - 2 sets - 3 reps - 20 sec  hold  ASSESSMENT:  CLINICAL IMPRESSION The patient tolerated exercises well today; pt performed initial stepping progression with some mild pain (3/10 NPS). Education provided on exercises with pain and self trigger point release for headache relief. OBJECTIVE IMPAIRMENTS Abnormal gait, decreased activity tolerance, decreased endurance, difficulty walking, decreased ROM, decreased strength, increased fascial restrictions, increased muscle spasms, and pain.   ACTIVITY LIMITATIONS cleaning, driving, meal prep, occupation, yard work, and shopping.   PERSONAL FACTORS L4-L5 fusion 2000; A-fib; chronic pain syndrome; depression ; dystonia ; fibromyalgia;  are also affecting patient's functional outcome.    REHAB POTENTIAL: Good for goals   CLINICAL DECISION MAKING: Evolving/moderate complexity long standing history of variable pain with exercises and activity   EVALUATION COMPLEXITY: Moderate   GOALS: Goals reviewed with patient? Yes  SHORT TERM GOALS: Target date: 03/20/2022  Patient will increase lumbar flexion by 15 degrees  Baseline: Goal status: not measured today   2.  Patient will increase passive hip flexion to 90 degrees without pain  Baseline:  Goal status: partially met range met but no pain   3.  Patient will increase gross bilateral LE strength by 5 lbs bilateral  Baseline:  Goal status: improved with all motions but not 5 on all ongoing   LONG TERM GOALS: Target date: 04/10/2022  Patient will be independent with a complete exercise program including pool and land exercises Baseline:  Goal status: working on it   2.  Patient will go up and down 5 steps without pain in order to improve mobility in the community  Baseline:  Goal status: will begin working on steps   3.  Patient will garden with  <4/10 pain  Baseline:  Goal status: INITIAL    PLAN: PT FREQUENCY: 1-2x/week  PT DURATION: 8 weeks  PLANNED INTERVENTIONS: Therapeutic exercises, Therapeutic activity, Neuromuscular re-education, Balance training, Gait training, Patient/Family education, Joint mobilization, Stair training, Aquatic Therapy, Dry Needling, Electrical stimulation, Cryotherapy, Moist heat, Ultrasound, and Manual therapy  PLAN FOR NEXT SESSION: Progress aquatic exercise as tolerated - core activation/stability, single leg balance, stepping program.     Lenell Antu, SPT 02/27/2022 10:04 AM  During this treatment session, the therapist was present, participating in and directing the treatment.

## 2022-02-27 NOTE — Therapy (Addendum)
OUTPATIENT PHYSICAL THERAPY LOWER EXTREMITY TREATMENT/discharge    Patient Name: Danielle Rocha MRN: [id number] DOB:[birth year], 71 y.o., female Today's Date: 01/31/2022   PT End of Session - 02/27/22 1000     Visit Number 15    Number of Visits 25    Date for PT Re-Evaluation 03/30/22    Authorization Type progress note performed at visit 9 next at visit 94    PT Start Time 0845    PT Stop Time 0928    PT Time Calculation (min) 43 min                Past Medical History:  Diagnosis Date   Arthritis    DJD, low back, thumb   Atrial fibrillation (HCC)    Xarelto anticoagulation. Flecainide antiarrythmic    Chicken pox    Chronic pain syndrome    On disability. History of bilateral hip pain, low back pain. Gabapentin 400 BID, percocet 1 tablet daily per prior provider.    Colon polyp    awaiting records   Depression    zoloft 176m, remeron 355mper psychiatry. ambien 1039mer psychiatry to help wtih sleep element.    Dystonia    described as psychogenic dystonia. Pain and twisting from upper chest and up with triggers "wind, creamy food" on TID ativan per psychiatry previously.    Fibromyalgia    GERD (gastroesophageal reflux disease)    omeprazole OTC   Goiter    states multiple imaging tests, has had biopsies   Hyperlipidemia    [facility] (left side of face and body decreased sensitivity than right face and side)   TIA (transient ischemic attack)    Vaginal atrophy    estrace vaginal cream   Past Surgical History:  Procedure Laterality Date   ATRIAL FIBRILLATION ABLATION     BIOPSY THYROID     fusion l4-l5  09/22/1998   LAMINECTOMY     L4-L5   OTHER SURGICAL HISTORY     tennis elbow surgery   piriformis release  09/22/1997   right hip   TONSILLECTOMY AND ADENOIDECTOMY  age 29   50AGINAL HYSTERECTOMY  09/23/1991   Patient Active Problem List   Diagnosis Date Noted   Sore in nose 07/30/2021   Involuntary movements  02/14/2020   Depression with anxiety 02/14/2020   Aortic atherosclerosis (HCC)[place]/18/2021   Delayed gastric emptying 02/07/2020   Dystonia    Restless legs 07/27/2018   Recurrent UTI 10/07/2016   Major depression in full remission (HCC)[place]/08/2016   GERD (gastroesophageal reflux disease) 01/30/2016   History of SI/intentional drug overdose 11/22/2015   Atrial fibrillation (HCC)    Chronic pain syndrome    Hyperlipidemia     PCP: HuntMarin Olp   REFERRING PROVIDER:Dr StevVanetta MuldersREFERRING DIAG: [phone]) - Tendinopathy of gluteus medius   THERAPY DIAG:  No diagnosis found.   ONSET DATE: 1998 was the initial onset    SUBJECTIVE:    SUBJECTIVE STATEMENT: Patient reports an increase in migraines over the past several days; bil hip pain has been improving. Pt has been able to do some gardening recently without increase in pain.   PERTINENT HISTORY: L4-L5 fusion 2000; A-fib; chronic pain syndrome; depression ; dystonia ; fibromyalgia;    PAIN:  Are you having pain? Yes NPRS scale:   ( not assessing official pain levels on  a regular basis on this time 2nd to pain  focus.)  Pain location: bilat ant hips  Pain description: aching  Aggravating factors:  Relieving factors: lay down and get on a heating pad and find the right position    PRECAUTIONS: None   WEIGHT BEARING RESTRICTIONS No   FALLS:  Has patient fallen in last 6 months? Yes. Number of falls was having frequent falls. It was found to be medication related.    LIVING ENVIRONMENT: Steps to the attic   OCCUPATION:  Retired    Hobbies: gardening    PLOF: Independent   PATIENT GOALS   To have less pain. To improve her ability to perform activity.      OBJECTIVE:    MMT  Right 12/27/2021 Left 12/27/2021 Right  01/31/22 Left 01/31/22  Hip flexion 6.1 8.3 15.7 10.8  Hip extension          Hip abduction 6.6 9.6 8.7 15.0  Hip adduction          Hip internal rotation          Hip  external rotation          Knee flexion          Knee extension 7.7 6.5 12.8 13.1  Ankle dorsiflexion          Ankle plantarflexion          Ankle inversion          Ankle eversion           (Blank rows = not tested)   PROM Right 12/27/2021 Left 12/27/2021 Right 5/12 Left  5/12   Hip flexion Pain with intial onset of movement  70 degrees  Painful arc but able to get to 90 degrees  95  Hip extension          Hip abduction          Hip adduction          Hip internal rotation Significant pain  Significant pain       Hip external rotation Significant pain  Significant pain       Knee flexion          Knee extension          Ankle dorsiflexion          Ankle plantarflexion          Ankle inversion          Ankle eversion           (Blank rows = not tested)     TODAY'S TREATMENT:     6/8 Pt seen for aquatic therapy today.  Treatment took place in water 3.25-4.8 ft in depth at the [facility] pool. Temp of water was 90.  Pt entered/exited the pool via stairs (step through pattern) independently with bilat rail.   Warm up: forward walking, backward walking, side-stepping x3 laps each Holding wall:    - Hip flex/ext leg swings, 2x10    - Heel/toe raise 2x10, balance focus Rainbow weights - row 2x10, shoulder extension 2x10, alternating shld add - focus on core activation and stability Stepping progression - step ups, step up w/leg drive, side step ups x10 Seated kickboard forward flexion, rotation stretching   Pt requires buoyancy for support and to offload joints with strengthening exercises. Viscosity of the water is needed for resistance of strengthening; water current perturbations provides challenge to standing balance unsupported, requiring increased core activation.     6/5 Pt seen for aquatic therapy today.  Treatment took  place in water 3.25-4.8 ft in depth at the [facility] pool. Temp of water was 90.  Pt entered/exited the pool via stairs (step  through pattern) independently with bilat rail.      Warm up: backward walking x 3 laps each; side stepping 3  laps - without UE support    Holding  wall   Hip abdct x 10 reps, hip flex /ext leg swings x10each, heel raise x20     Rainbow buoy push downs at side x 10    Wall push up/ push offs at 4+ feet depth x 10 Kick board forward stretch x10 5 sec hold       Rainbow weights:  Standing: shoulder flexion 2x10  Winging 2x10  Top water quick extension 2x10    Balance:  Box step 10x each way tandem stance 2x30 each foot forward  Narrow base eyes open 1x                         Pt requires buoyancy for support and to offload joints with strengthening exercises. Viscosity of the water is needed for resistance of strengthening; water current perturbations provides challenge to standing balance unsupported, requiring increased core activation.     5/30   Pt seen for aquatic therapy today.  Treatment took place in water 3.25-4.8 ft in depth at the [facility] pool. Temp of water was 90.  Pt entered/exited the pool via stairs (step through pattern) independently with bilat rail.      Warm up: backward walking x 3 laps each; side stepping 3  laps - without UE support    Holding  wall   Hip abdct x 10 reps, hip flex /ext leg swings x10each, heel raise x20     Rainbow buoy push downs at side x 10    Wall push up/ push offs at 4+ feet depth x 10 Kick board forward stretch x10 5 sec hold       Rainbow weights:  Standing: shoulder flexion 2x10  Winging 2x10  Top water quick extension 2x10    Balance:  Box step 10x each way tandem stance 2x30 each foot forward  Narrow base eyes open 1x                         Pt requires buoyancy for support and to offload joints with strengthening exercises. Viscosity of the water is needed for resistance of strengthening; water current perturbations provides challenge to standing balance unsupported, requiring increased core activation.      Last Visit                          Pt requires buoyancy for support and to offload joints with strengthening exercises. Viscosity of the water is needed for resistance of strengthening; water current perturbations provides challenge to standing balance unsupported, requiring increased core activation.   Pt seen for aquatic therapy today.  Treatment took place in water 3.25-4.8 ft in depth at the [facility] pool. Temp of water was 92.  Pt entered/exited the pool via stairs (step through pattern) independently with bilat rail.      Warm up: backward walking x 3 laps each; side stepping 3  laps - without UE support    Holding yellow noodle:  Hip abdct x 5 reps, hip flex /ext leg swings x 5 each, row x 10 - 2 sets  Rainbow buoy push downs at side x 10    Wall push up/ push offs at 4+ feet depth x 10    Lumbar stretch holding wall    Monster walk backwards / forwards (cues for technique and to slow pace)    STS at bench in water with blue step under feet x 8 reps ; x 3 reps (stopped due to increased hip pain)  - with arms reaching forward;  Lumbar flexion x10s  holding kick board x 2; Lumbar lateral flexion x 10s holding kick board on surface for stretch x 2;  seated piriformis stretch x 10s/ glute stretch x 10s x 2 reps each LE    Forward walking for recovery    Squats x 5; lumbar stretch with feet on wall;  squats x 5                         Pt requires buoyancy for support and to offload joints with strengthening exercises. Viscosity of the water is needed for resistance of strengthening; water current perturbations provides challenge to standing balance unsupported, requiring increased core activation.     PATIENT EDUCATION:  Education details: reviewed central nervous system response to pain; self care massage; posture/ lumbar support Person educated: Patient Education method: Explanation, Demonstration, Tactile cues, Verbal cues Education comprehension: verbalized  understanding, returned demonstration, verbal cues required, tactile cues required, and needs further education     HOME EXERCISE PROGRAM: Access Code: [email or web address] URL: [email or web address] Date: 12/27/2021 Prepared by: Carolyne Littles   Exercises - Supine Lower Trunk Rotation  - 1 x daily - 7 x weekly - 3 sets - 10 reps - Supine Heel Slide  - 1 x daily - 7 x weekly - 3 sets - 10 reps - Seated Hamstring Stretch  - 1 x daily - 7 x weekly - 2 sets - 3 reps - 20 sec  hold   ASSESSMENT:   CLINICAL IMPRESSION The patient tolerated exercises well today; pt performed initial stepping progression with some mild pain (3/10 NPS). Education provided on exercises with pain and self trigger point release for headache relief.  OBJECTIVE IMPAIRMENTS Abnormal gait, decreased activity tolerance, decreased endurance, difficulty walking, decreased ROM, decreased strength, increased fascial restrictions, increased muscle spasms, and pain.    ACTIVITY LIMITATIONS cleaning, driving, meal prep, occupation, yard work, and shopping.    PERSONAL FACTORS L4-L5 fusion 2000; A-fib; chronic pain syndrome; depression ; dystonia ; fibromyalgia;  are also affecting patient's functional outcome.      REHAB POTENTIAL: Good for goals    CLINICAL DECISION MAKING: Evolving/moderate complexity long standing history of variable pain with exercises and activity    EVALUATION COMPLEXITY: Moderate     GOALS: Goals reviewed with patient? Yes   SHORT TERM GOALS: Target date: 03/20/2022   Patient will increase lumbar flexion by 15 degrees  Baseline: Goal status: not measured today    2.  Patient will increase passive hip flexion to 90 degrees without pain  Baseline:  Goal status: partially met range met but no pain    3.  Patient will increase gross bilateral LE strength by 5 lbs bilateral  Baseline:  Goal status: improved with all motions but not 5 on all ongoing    LONG TERM GOALS: Target date:  04/10/2022   Patient will be independent with a complete exercise program including pool and land exercises Baseline:  Goal status: working on it    2.  Patient will go  up and down 5 steps without pain in order to improve mobility in the community  Baseline:  Goal status: will begin working on steps    3.  Patient will garden with <4/10 pain  Baseline:  Goal status: INITIAL       PLAN: PT FREQUENCY: 1-2x/week   PT DURATION: 8 weeks   PLANNED INTERVENTIONS: Therapeutic exercises, Therapeutic activity, Neuromuscular re-education, Balance training, Gait training, Patient/Family education, Joint mobilization, Stair training, Aquatic Therapy, Dry Needling, Electrical stimulation, Cryotherapy, Moist heat, Ultrasound, and Manual therapy   PLAN FOR NEXT SESSION: Progress aquatic exercise as tolerated - core activation/stability, single leg balance, stepping program.        Carolyne Littles PT DPT  02/28/22 8:17 AM  PHYSICAL THERAPY DISCHARGE SUMMARY  Visits from Start of Care: 15  Current functional level related to goals / functional outcomes: Improved pain; full exercise program    Remaining deficits: Pain still   Education / Equipment: HEP    Patient agrees to discharge. Patient goals were met. Patient is being discharged due to meeting the stated rehab goals.

## 2022-02-28 ENCOUNTER — Encounter (HOSPITAL_BASED_OUTPATIENT_CLINIC_OR_DEPARTMENT_OTHER): Payer: Self-pay | Admitting: Physical Therapy

## 2022-03-04 ENCOUNTER — Ambulatory Visit (HOSPITAL_BASED_OUTPATIENT_CLINIC_OR_DEPARTMENT_OTHER): Payer: Medicare HMO | Admitting: Physical Therapy

## 2022-03-14 ENCOUNTER — Ambulatory Visit (HOSPITAL_BASED_OUTPATIENT_CLINIC_OR_DEPARTMENT_OTHER): Payer: Medicare HMO | Admitting: Orthopaedic Surgery

## 2022-03-20 DIAGNOSIS — R040 Epistaxis: Secondary | ICD-10-CM | POA: Diagnosis not present

## 2022-03-20 DIAGNOSIS — H9313 Tinnitus, bilateral: Secondary | ICD-10-CM | POA: Diagnosis not present

## 2022-03-21 ENCOUNTER — Ambulatory Visit (HOSPITAL_BASED_OUTPATIENT_CLINIC_OR_DEPARTMENT_OTHER): Payer: Medicare HMO | Admitting: Orthopaedic Surgery

## 2022-03-21 DIAGNOSIS — M542 Cervicalgia: Secondary | ICD-10-CM

## 2022-03-21 DIAGNOSIS — M67959 Unspecified disorder of synovium and tendon, unspecified thigh: Secondary | ICD-10-CM

## 2022-03-21 NOTE — Progress Notes (Signed)
Chief Complaint: Bilateral hip pain     History of Present Illness:   03/21/2022: Presents today for bilateral hip pain as well as neck pain.  She is now began experiencing headaches approximately 6 times a week.  She does feel very mechanical clicking and popping in the neck.  She believes that this may have a role in inciting her headaches.  With regard to her head she continues to feel better although she does state that she does not want to remain in pain forever.  At this time she is doing well enough that she would forego any type of hip intervention.  Chace Fleer is a 71 y.o. female presents today with right worse than left hip pain.  This has been going on since 1998.  She has an extremely extensive history of treatment on both of these hips.  She states that the right has been the focus of surgical treatment.  Specifically she did have a right piriformis release done in 1999 in [place] as well as an L4-L5 fusion which was done at [facility] around this time.  She states that she got no relief from either of these procedures.  She continues to experience pain about the lateral aspect of the hips.  She has had multiple injections into the SI and into the hips bilaterally.  These were all done many years prior.  She has been working on physical therapy for multiple years and most recently began working on the hips a year prior.  She is not getting any type of long-term relief.  She is the mother of [name] who I have been treating for his knee.    Surgical History:   As above  PMH/PSH/Family History/Social History/Meds/Allergies:    Past Medical History:  Diagnosis Date   Arthritis    DJD, low back, thumb   Atrial fibrillation (HCC)    Xarelto anticoagulation. Flecainide antiarrythmic    Chicken pox    Chronic pain syndrome    On disability. History of bilateral hip pain, low back pain. Gabapentin 400 BID, percocet 1 tablet daily per prior  provider.    Colon polyp    awaiting records   Depression    zoloft 100mg , remeron 30mg  per psychiatry. ambien 10mg  per psychiatry to help wtih sleep element.    Dystonia    described as psychogenic dystonia. Pain and twisting from upper chest and up with triggers "wind, creamy food" on TID ativan per psychiatry previously.    Fibromyalgia    GERD (gastroesophageal reflux disease)    omeprazole OTC   Goiter    states multiple imaging tests, has had biopsies   Hyperlipidemia    lovastatin 20mg    Stroke (HCC)    TIA (left side of face and body decreased sensitivity than right face and side)   TIA (transient ischemic attack)    Vaginal atrophy    estrace vaginal cream   Past Surgical History:  Procedure Laterality Date   ATRIAL FIBRILLATION ABLATION     BIOPSY THYROID     fusion l4-l5  09/22/1998   LAMINECTOMY     L4-L5   OTHER SURGICAL HISTORY     tennis elbow surgery   piriformis release  09/22/1997   right hip   TONSILLECTOMY AND ADENOIDECTOMY  age 43   VAGINAL HYSTERECTOMY  09/23/1991   Social History   Socioeconomic History   Marital status: Married    Spouse name: Not on file   Number of children: 3   Years of education: 13   Highest education level: Not on file  Occupational History   Occupation: retired  Tobacco Use   Smoking status: Never   Smokeless tobacco: Never  Vaping Use   Vaping Use: Never used  Substance and Sexual Activity   Alcohol use: No    Alcohol/week: 0.0 standard drinks of alcohol   Drug use: No   Sexual activity: Not Currently    Partners: Male  Other Topics Concern   Not on file  Social History Narrative   Family: Married. Husband works as Geophysicist/field seismologist for [employer]. 2 children from previous marriage 1 from current. Son [name] lives with them and has down's syndrome.       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 Moved to [facility] from [id number] and became disabled in that time frame due to hip/back issues.        Hobbies: time with son   Right handed   One story home   Caffeine occasionally   Social Determinants of Health   Financial Resource Strain: Low Risk  (09/09/2021)   Overall Financial Resource Strain (CARDIA)    Difficulty of Paying Living Expenses: Not hard at all  Food Insecurity: No Food Insecurity (09/09/2021)   Hunger Vital Sign    Worried About Running Out of Food in the Last Year: Never true    Ran Out of Food in the Last Year: Never true  Transportation Needs: No Transportation Needs (09/09/2021)   PRAPARE - Hydrologist (Medical): No    Lack of Transportation (Non-Medical): No  Physical Activity: Insufficiently Active (09/09/2021)   Exercise Vital Sign    Days of Exercise per Week: 3 days    Minutes of Exercise per Session: 30 min  Stress: No Stress Concern Present (09/09/2021)   [place]    Feeling of Stress : Not at all  Social Connections: Unknown (09/09/2021)   Social Connection and Isolation Panel [NHANES]    Frequency of Communication with Friends and Family: More than three times a week    Frequency of Social Gatherings with Friends and Family: More than three times a week    Attends Religious Services: More than 4 times per year    Active Member of [employer] or Organizations: No    Attends Music therapist: Never    Marital Status: Not on file   Family History  Problem Relation Age of Onset   Alcohol abuse Mother        possible she had stomach cancer as well but unsure   Hypertension Father    Alcohol abuse Father    Pancreatic cancer Father    Cancer Brother        spindle cell RLE   Diabetes Brother    Cancer Brother        tonsil cancer   Skin cancer Brother    Colon cancer Neg Hx    Colon polyps Neg Hx    Stomach cancer Neg Hx    Allergies  Allergen Reactions   Reglan [Metoclopramide] Other (See Comments)    Suicidal thoughts    Ciprofloxacin Diarrhea   Clindamycin/Lincomycin Diarrhea   Doxycycline Diarrhea and Nausea And Vomiting   Macrobid [Nitrofurantoin Monohyd Macro] Rash  Rash on lip   Sulfa Antibiotics Rash   Current Outpatient Medications  Medication Sig Dispense Refill   methocarbamol (ROBAXIN) 500 MG tablet Take 1 tablet (500 mg total) by mouth 4 (four) times daily. 30 tablet 0   acetaminophen (TYLENOL) 500 MG tablet Take 1,000 mg by mouth every 6 (six) hours as needed for mild pain, moderate pain, fever or headache.     buPROPion (WELLBUTRIN XL) 150 MG 24 hr tablet Take 150 mg by mouth daily. Per psychiatry     cephALEXin (KEFLEX) 250 MG capsule Take 250 mg by mouth daily. Takes daily for frequent UTIs.     cholecalciferol (VITAMIN D) 1000 units tablet Take 1,000 Units by mouth daily.     co-enzyme Q-10 30 MG capsule Take 30 mg by mouth daily.     Cyanocobalamin (B-12 PO) Take by mouth.     EMGALITY 120 MG/ML SOAJ INJECT  240 MG SUBCUTANEOUSLY ONCE FOR 1 DOSE LOADING DOSE 2 mL 0   FERROUS SULFATE PO Take by mouth.     flecainide (TAMBOCOR) 100 MG tablet Take 1 tablet (100 mg total) by mouth 2 (two) times daily. 180 tablet 3   Galcanezumab-gnlm (EMGALITY) 120 MG/ML SOAJ Inject 120 mg into the skin every 30 (thirty) days. 1.12 mL 5   Melatonin 3 MG TABS Take 2 tablets by mouth at bedtime.      Multiple Vitamin (MULTIVITAMIN WITH MINERALS) TABS tablet Take 1 tablet by mouth daily.     ondansetron (ZOFRAN ODT) 4 MG disintegrating tablet Take 1 tablet (4 mg total) by mouth every 8 (eight) hours as needed for nausea or vomiting. 20 tablet 0   pantoprazole (PROTONIX) 20 MG tablet Take 1 tablet (20 mg total) by mouth 2 (two) times daily before a meal. Take 2 tablets daily. 180 tablet 3   rivaroxaban (XARELTO) 20 MG TABS tablet Take 1 tablet (20 mg total) by mouth daily with supper. 90 tablet 3   rosuvastatin (CRESTOR) 20 MG tablet Take 1 tablet (20 mg total) by mouth daily. 90 tablet 3   sertraline (ZOLOFT) 50  MG tablet Take 1 tablet (50 mg total) by mouth daily. 30 tablet 6   SUMAtriptan (IMITREX) 100 MG tablet Take 1 tablet (100 mg total) by mouth once as needed for up to 1 dose for migraine. 10 tablet 5   traZODone (DESYREL) 50 MG tablet Take 0.5-1 tablets (25-50 mg total) by mouth at bedtime as needed for sleep. 30 tablet 5   No current facility-administered medications for this visit.   No results found.  Review of Systems:   A ROS was performed including pertinent positives and negatives as documented in the HPI.  Physical Exam :   Constitutional: NAD and appears stated age Neurological: Alert and oriented Psych: Appropriate affect and cooperative There were no vitals taken for this visit.   Comprehensive Musculoskeletal Exam:    Inspection Right Left  Skin No atrophy or gross abnormalities appreciated No atrophy or gross abnormalities appreciated  Palpation    Tenderness None None  Crepitus None None  Range of Motion    Flexion (passive) 120 120  Extension 30 30  IR 30 no pain 30 no pain  ER 45 with trochanteric pain 45 with trochanteric pain  Strength    Flexion  5/5 5/5  Extension 5/5 5/5  Special Tests    FABIR Negative Negative  FADER Negative Negative  ER Lag/Capsular Insufficiency Negative Negative  Instability Negative Negative  Sacroiliac pain Negative  Negative   Instability    Generalized Laxity No No  Neurologic    sciatic, femoral, obturator nerves intact to light sensation  Vascular/Lymphatic    DP pulse 2+ 2+  Lumbar Exam    Patient has symmetric lumbar range of motion with negative pain referral to hip   Weakness with resisted abduction in the lateral position on both sides.  She is got Trendelenburg with ambulation.  She has a wobbly gait with single standing leg test.  Tenderness to palpation about the midline lower cervical spine.  There is popping with rotation of the neck.  Otherwise negative Spurling and no weakness or dysesthesias in bilateral  upper extremities  Imaging:   Xray (AP pelvis, 2 views right hip, 2 views left hip): Very mild osteoarthritis involving both hips  MRI right hip, MRI left hip: There is evidence of a partial-thickness tear of the right as well as the left gluteus medius.  She has a full-thickness tear of the right gluteus minimus on the right side.  I personally reviewed and interpreted the radiographs.   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  At this time she is deferring any type of additional intervention on this.  I do believe that with regard to her neck it may be worthwhile to consult my partner Dr. Alvester Morin to see if she would be a candidate for any type of facet denervation as she does appear to have cervical etiology of her headaches.  She has not noticed any relief after being started on medication for her migraines. Plan :    -Plan for referral to Dr. Alvester Morin for discussion of possible facet cervical denervation -Return to clinic as needed should she want additional hip injections versus discussion of surgery       I personally saw and evaluated the patient, and participated in the management and treatment plan.  Huel Cote, MD Attending Physician, Orthopedic Surgery  This document was dictated using Dragon voice recognition software. A reasonable attempt at proof reading has been made to minimize errors.

## 2022-03-22 DIAGNOSIS — H524 Presbyopia: Secondary | ICD-10-CM | POA: Diagnosis not present

## 2022-03-24 DIAGNOSIS — H903 Sensorineural hearing loss, bilateral: Secondary | ICD-10-CM | POA: Diagnosis not present

## 2022-04-01 ENCOUNTER — Encounter: Payer: Self-pay | Admitting: Physical Medicine and Rehabilitation

## 2022-04-01 ENCOUNTER — Ambulatory Visit: Payer: Medicare HMO | Admitting: Physical Medicine and Rehabilitation

## 2022-04-01 ENCOUNTER — Ambulatory Visit (INDEPENDENT_AMBULATORY_CARE_PROVIDER_SITE_OTHER): Payer: Medicare HMO

## 2022-04-01 VITALS — BP 103/53 | HR 72

## 2022-04-01 DIAGNOSIS — G894 Chronic pain syndrome: Secondary | ICD-10-CM | POA: Diagnosis not present

## 2022-04-01 DIAGNOSIS — M542 Cervicalgia: Secondary | ICD-10-CM

## 2022-04-01 DIAGNOSIS — M7918 Myalgia, other site: Secondary | ICD-10-CM | POA: Diagnosis not present

## 2022-04-01 DIAGNOSIS — M5412 Radiculopathy, cervical region: Secondary | ICD-10-CM

## 2022-04-01 DIAGNOSIS — M797 Fibromyalgia: Secondary | ICD-10-CM

## 2022-04-01 NOTE — Progress Notes (Unsigned)
Molene Rocha - 71 y.o. female MRN [id number]  Date of birth: [birth year]  Office Visit Note: Visit Date: 04/01/2022 PCP: Marin Olp, MD Referred by: Marin Olp, MD  Subjective: Chief Complaint  Patient presents with  . Neck - Pain  . Right Shoulder - Edema  . Left Shoulder - Pain  . Head - Pain   HPI: Danielle Rocha is a 71 y.o. female who comes in today per the request of Dr. Vanetta Mulders for evaluation of chronic, worsening and severe bilateral neck pain radiating to shoulders and up to head. Patient reports pain has been ongoing for several months and worsens with movement and activity. She describes her pain as a sore, aching and sharp sensation, currently rates as 8 out of 10. Patient states neck pain becomes severe at night and causes difficulty sleeping. Patient also states she often hears clicking and popping noises when moving neck. States her most severe pain is with side to side rotation of her neck. She reports some relief of pain with home exercise regimen, rest and use of heating pad. Patient recently completed regimen of physical therapy at [facility] primarily for bilateral hips, however she reports manual treatments to bilateral neck with minimal relief of pain. Patient does have history of chronic migraines that is currently being managed by Dr. Sharene Butters at [facility]. Patient also has history of fibromyalgia, chronic pain syndrome and major depression. Patient denies focal weakness, numbness and tingling. Patient denies recent trauma or falls.   Review of Systems  Musculoskeletal:  Positive for myalgias and neck pain.  Neurological:  Negative for tingling, sensory change, focal weakness and weakness.  All other systems reviewed and are negative.  Otherwise per HPI.  Assessment & Plan: Visit Diagnoses:    ICD-10-CM   1. Radiculopathy, cervical region  M54.12     2. Cervicalgia  M54.2 XR Cervical Spine 2 or 3 views    MR  CERVICAL SPINE WO CONTRAST    3. Myofascial pain syndrome  M79.18     4. Chronic pain syndrome  G89.4     5. Fibromyalgia  M79.7        Plan: Findings:  Chronic, worsening and severe bilateral neck pain radiating to shoulders and up to head. Patient continues to have severe pain despite good conservative therapies such as formal physical therapy, home exercise regimen, rest and use of heating pad. Patients clinical presentation and exam are complex, differentials could include cervical radiculopathy, cervical facet mediated pain and myofascial pain syndrome as her discomfort does radiate up to head. I also believe her fibromyalgia is working to exacerbate her pain. We did obtain cervical x-ray images today in the office that exhibits normal lordotic curvature, multilevel facet arthropathy. No fractures. Her cervical spine imaging looks good for her age. We believe the next step is to obtain cervical MRI imaging, we did discuss the possibility of performing cervical injection depending on results of imaging. I would like to have patient follow up after cervical MRI imaging is obtained to discuss results and treatment options. No red flag symptoms noted upon exam today.   I did explain to patient that her pain radiating up to her head is not directly related to her cervical spine. These symptoms could be related to chronic migraines, fibromyalgia and myofascial pain. She does have multiple palpable trigger points noted to bilateral levator scapulae and trapezius muscles. Patient is particularly tender to touch upon exam today. I did emphasize importance of managing  fibromyalgia and myofascial pain, I would recommend re-grouping with PT for dry needling sessions and massage therapy.     Meds & Orders: No orders of the defined types were placed in this encounter.   Orders Placed This Encounter  Procedures  . XR Cervical Spine 2 or 3 views  . MR CERVICAL SPINE WO CONTRAST    Follow-up: Return for  follow up after cervical MRI imaging is obtained.   Procedures: No procedures performed      Clinical History: No specialty comments available.   She reports that she has never smoked. She has never used smokeless tobacco. No results for input(s): "HGBA1C", "LABURIC" in the last 8760 hours.  Objective:  VS:  HT:    WT:   BMI:     BP:(!) 103/53  HR:72bpm  TEMP: ( )  RESP:  Physical Exam Vitals and nursing note reviewed.  HENT:     Head: Normocephalic and atraumatic.     Right Ear: External ear normal.     Left Ear: External ear normal.     Nose: Nose normal.     Mouth/Throat:     Mouth: Mucous membranes are moist.  Eyes:     Extraocular Movements: Extraocular movements intact.  Cardiovascular:     Rate and Rhythm: Normal rate.     Pulses: Normal pulses.  Pulmonary:     Effort: Pulmonary effort is normal.  Abdominal:     General: Abdomen is flat. There is no distension.  Musculoskeletal:        General: Tenderness present.     Cervical back: Tenderness present.     Comments: No discomfort noted with flexion, extension and side-to-side rotation. Patient has good strength in the upper extremities including 5 out of 5 strength in wrist extension, long finger flexion and APB. There is no atrophy of the hands intrinsically. Multiple palpable trigger points noted to bilateral levator scapulae and trapezius muscles.Sensation intact bilaterally. Negative Hoffman's sign.   Skin:    General: Skin is warm and dry.     Capillary Refill: Capillary refill takes less than 2 seconds.  Neurological:     General: No focal deficit present.     Mental Status: She is alert and oriented to person, place, and time.  Psychiatric:        Mood and Affect: Mood normal.        Behavior: Behavior normal.    Ortho Exam  Imaging: No results found.  Past Medical/Family/Surgical/Social History: Medications & Allergies reviewed per EMR, new medications updated. Patient Active Problem List    Diagnosis Date Noted  . Sore in nose 07/30/2021  . Involuntary movements 02/14/2020  . Depression with anxiety 02/14/2020  . Aortic atherosclerosis (HCC) 02/07/2020  . Delayed gastric emptying 02/07/2020  . Dystonia   . Restless legs 07/27/2018  . Recurrent UTI 10/07/2016  . Major depression in full remission (HCC) 09/02/2016  . GERD (gastroesophageal reflux disease) 01/30/2016  . History of SI/intentional drug overdose 11/22/2015  . Atrial fibrillation (HCC)   . Chronic pain syndrome   . Hyperlipidemia    Past Medical History:  Diagnosis Date  . Arthritis    DJD, low back, thumb  . Atrial fibrillation (HCC)    Xarelto anticoagulation. Flecainide antiarrythmic   . Chicken pox   . Chronic pain syndrome    On disability. History of bilateral hip pain, low back pain. Gabapentin 400 BID, percocet 1 tablet daily per prior provider.   . Colon polyp  awaiting records  . Depression    zoloft 100mg , remeron 30mg  per psychiatry. ambien 10mg  per psychiatry to help wtih sleep element.   . Dystonia    described as psychogenic dystonia. Pain and twisting from upper chest and up with triggers "wind, creamy food" on TID ativan per psychiatry previously.   . Fibromyalgia   . GERD (gastroesophageal reflux disease)    omeprazole OTC  . Goiter    states multiple imaging tests, has had biopsies  . Hyperlipidemia    lovastatin 20mg   . Stroke (HCC)    TIA (left side of face and body decreased sensitivity than right face and side)  . TIA (transient ischemic attack)   . Vaginal atrophy    estrace vaginal cream   Family History  Problem Relation Age of Onset  . Alcohol abuse Mother        possible she had stomach cancer as well but unsure  . Hypertension Father   . Alcohol abuse Father   . Pancreatic cancer Father   . Cancer Brother        spindle cell RLE  . Diabetes Brother   . Cancer Brother        tonsil cancer  . Skin cancer Brother   . Colon cancer Neg Hx   . Colon polyps Neg  Hx   . Stomach cancer Neg Hx    Past Surgical History:  Procedure Laterality Date  . ATRIAL FIBRILLATION ABLATION    . BIOPSY THYROID    . fusion l4-l5  09/22/1998  . LAMINECTOMY     L4-L5  . OTHER SURGICAL HISTORY     tennis elbow surgery  . piriformis release  09/22/1997   right hip  . TONSILLECTOMY AND ADENOIDECTOMY  age 44  . VAGINAL HYSTERECTOMY  09/23/1991   Social History   Occupational History  . Occupation: retired  Tobacco Use  . Smoking status: Never  . Smokeless tobacco: Never  Vaping Use  . Vaping Use: Never used  Substance and Sexual Activity  . Alcohol use: No    Alcohol/week: 0.0 standard drinks of alcohol  . Drug use: No  . Sexual activity: Not Currently    Partners: Male

## 2022-04-01 NOTE — Progress Notes (Unsigned)
Pt state neck pain that travels to both shoulder and up her head. Pt state any movement makes the pain worse. Pt state getting out of bed makes the pain worse. Pt state she takes over the counter pain meds to help uses heat to help ease her pain.  Numeric Pain Rating Scale and Functional Assessment Average Pain 10 Pain Right Now 4 My pain is constant, sharp, burning, dull, and aching Pain is worse with: walking, bending, sitting, standing, some activites, and laying down Pain improves with: medication   In the last MONTH (on 0-10 scale) has pain interfered with the following?  1. General activity like being  able to carry out your everyday physical activities such as walking, climbing stairs, carrying groceries, or moving a chair?  Rating(5)  2. Relation with others like being able to carry out your usual social activities and roles such as  activities at home, at work and in your community. Rating(6)  3. Enjoyment of life such that you have  been bothered by emotional problems such as feeling anxious, depressed or irritable?  Rating(7)

## 2022-04-10 ENCOUNTER — Encounter (HOSPITAL_BASED_OUTPATIENT_CLINIC_OR_DEPARTMENT_OTHER): Payer: Self-pay | Admitting: Orthopaedic Surgery

## 2022-04-10 ENCOUNTER — Ambulatory Visit
Admission: RE | Admit: 2022-04-10 | Discharge: 2022-04-10 | Disposition: A | Discharge status: Home / Self Care | Payer: Medicare HMO | Source: Ambulatory Visit | Attending: Physical Medicine and Rehabilitation | Admitting: Physical Medicine and Rehabilitation

## 2022-04-10 DIAGNOSIS — M4802 Spinal stenosis, cervical region: Secondary | ICD-10-CM | POA: Diagnosis not present

## 2022-04-10 DIAGNOSIS — M542 Cervicalgia: Secondary | ICD-10-CM | POA: Diagnosis not present

## 2022-04-11 NOTE — Telephone Encounter (Signed)
Scheduled 7/27

## 2022-04-16 ENCOUNTER — Encounter: Payer: Self-pay | Admitting: Physical Medicine and Rehabilitation

## 2022-04-16 ENCOUNTER — Ambulatory Visit (INDEPENDENT_AMBULATORY_CARE_PROVIDER_SITE_OTHER): Payer: Medicare HMO | Admitting: Physical Medicine and Rehabilitation

## 2022-04-16 DIAGNOSIS — M47812 Spondylosis without myelopathy or radiculopathy, cervical region: Secondary | ICD-10-CM

## 2022-04-16 DIAGNOSIS — M5412 Radiculopathy, cervical region: Secondary | ICD-10-CM | POA: Diagnosis not present

## 2022-04-16 DIAGNOSIS — M542 Cervicalgia: Secondary | ICD-10-CM | POA: Diagnosis not present

## 2022-04-16 NOTE — Progress Notes (Signed)
Danielle Rocha - 71 y.o. female MRN [id number]  Date of birth: 01-29-1951  Office Visit Note: Visit Date: 04/16/2022 PCP: Shelva Majestic, MD Referred by: Shelva Majestic, MD  Subjective: Chief Complaint  Patient presents with   Neck - Pain   Right Shoulder - Pain   Left Shoulder - Pain   HPI: Danielle Rocha is a 72 y.o. female who comes in today for evaluation of chronic, worsening and severe bilateral neck pain radiating to shoulders, right greater than left. Pain ongoing for several months, exacerbated by movement and activity, describes as sore and sharp, currently rates as 8 out of 10. She reports some relief of pain with home exercise regimen, rest and use of heating pad. Patient has previously completed regimen of physical therapy at [facility] primarily for bilateral hips, however she reports manual treatments to bilateral neck with minimal relief of pain. Patients recent cervical MRI imaging exhibits multilevel degenerative facet changes, slight anterolisthesis at C2-C3 and uncovertebral ridging asymmetric to the left at C3-C4 where there is moderate foraminal stenosis. No high grade spinal canal stenosis. Patient does have history of chronic migraines that is currently being managed by Marlowe Kays, PA at [facility]. Patient is currently taking Emgality and Imitrex that does seem to be helping pain radiating up to head which she feels is more migraine related. Patient also has history of fibromyalgia, chronic pain syndrome and major depression. Patient denies focal weakness, numbness and tingling. Patient denies recent trauma or falls.    Review of Systems  Musculoskeletal:  Positive for neck pain.  Neurological:  Negative for tingling, sensory change, focal weakness and weakness.  All other systems reviewed and are negative.  Otherwise per HPI.  Assessment & Plan: Visit Diagnoses:    ICD-10-CM   1. Radiculopathy, cervical region  M54.12 Ambulatory  referral to Physical Medicine Rehab    2. Cervicalgia  M54.2 Ambulatory referral to Physical Medicine Rehab    3. Facet arthropathy, cervical  M47.812 Ambulatory referral to Physical Medicine Rehab       Plan: Findings:  Chronic, worsening and severe bilateral neck pain radiating to shoulders, right greater than left.  Patient continues to have severe pain despite good conservative therapy such as formal physical therapy, home exercise regimen, rest and use of medications.  Patient's clinical presentation and exam are consistent with C3-C4 nerve pattern, she does have uncovertebral ridging asymmetric to the left where there is moderate foraminal stenosis at this level. Next step is to perform diagnostic and hopefully therapeutic right C7-T1 interlaminar epidural steroid injection under fluoroscopic guidance. Patient is currently taking Xarelto, we will contact Dr. Tana Conch for permission to be taken off this medication for injection procedure. I did discuss cervical epidural steroid injection procedure with patient in detail today and did provide her with educational material to take home and review. We will call patient to schedule injection procedure after approval. No red flag symptoms noted upon exam today.  Patient has had physical therapy and continues with directed home exercise program.  Current medication management is not beneficial in increasing her functional status.  Please note that procedures are done as part of a comprehensive orthopedic and pain management program with access to in-house orthopedics, spine surgery and physical therapy as well as access to [facility] biopsychosocial counseling if needed.      Meds & Orders: No orders of the defined types were placed in this encounter.   Orders Placed This Encounter  Procedures  Ambulatory referral to Physical Medicine Rehab    Follow-up: Return for Right C7-T1 interlaminar epidural steroid injection.    Procedures: No procedures performed      Clinical History: EXAM: MRI CERVICAL SPINE WITHOUT CONTRAST   TECHNIQUE: Multiplanar, multisequence MR imaging of the cervical spine was performed. No intravenous contrast was administered.   COMPARISON:  01/05/2022 brain MRI   FINDINGS: Alignment: Mild anterolisthesis at C2-3.   Vertebrae: No fracture, evidence of discitis, or bone lesion.   Cord: Normal signal and morphology.   Posterior Fossa, vertebral arteries, paraspinal tissues: Negative.   Disc levels:   C2-3: Slight anterolisthesis although no significant degenerative changes noted   C3-4: Uncovertebral ridging asymmetric to the left where there is moderate foraminal stenosis.   C4-5: Facet spurring and left uncovertebral ridging with mild left foraminal narrowing.   C5-6: Disc narrowing with bulging and uncovertebral ridging. Mild left foraminal narrowing   C6-7: Degenerative facet spurring asymmetric to the left. Mild left uncovertebral ridging. Moderate left foraminal narrowing. Right foraminal root sleeve cyst.   C7-T1:Unremarkable.   IMPRESSION: 1. Cervical spine degeneration causing moderate foraminal stenosis on the left at C3-4 and C6-7. 2. No inflammation or impingement seen at the skull base to explain radiation to the head.     Electronically Signed   By: Tiburcio Pea M.D.   On: 04/10/2022 17:05   She reports that she has never smoked. She has never used smokeless tobacco. No results for input(s): "HGBA1C", "LABURIC" in the last 8760 hours.  Objective:  VS:  HT:    WT:   BMI:     BP:   HR: bpm  TEMP: ( )  RESP:  Physical Exam Vitals and nursing note reviewed.  HENT:     Head: Normocephalic and atraumatic.     Right Ear: External ear normal.     Left Ear: External ear normal.     Nose: Nose normal.     Mouth/Throat:     Mouth: Mucous membranes are moist.  Eyes:     Extraocular Movements: Extraocular movements intact.   Cardiovascular:     Rate and Rhythm: Normal rate.     Pulses: Normal pulses.  Pulmonary:     Effort: Pulmonary effort is normal.  Abdominal:     General: Abdomen is flat. There is no distension.  Musculoskeletal:        General: Tenderness present.     Cervical back: Tenderness present.     Comments: Discomfort noted with flexion, extension and side-to-side rotation. Patient has good strength in the upper extremities including 5 out of 5 strength in wrist extension, long finger flexion and APB.  There is no atrophy of the hands intrinsically. Dysesthesias noted to bilateral C3/C4 dermatomes. Sensation intact bilaterally. Negative Hoffman's sign.   Skin:    General: Skin is warm and dry.     Capillary Refill: Capillary refill takes less than 2 seconds.  Neurological:     General: No focal deficit present.     Mental Status: She is alert and oriented to person, place, and time.  Psychiatric:        Mood and Affect: Mood normal.        Behavior: Behavior normal.     Ortho Exam  Imaging: No results found.  Past Medical/Family/Surgical/Social History: Medications & Allergies reviewed per EMR, new medications updated. Patient Active Problem List   Diagnosis Date Noted   Sore in nose 07/30/2021   Involuntary movements 02/14/2020   Depression with  anxiety 02/14/2020   Aortic atherosclerosis (HCC) 02/07/2020   Delayed gastric emptying 02/07/2020   Dystonia    Restless legs 07/27/2018   Recurrent UTI 10/07/2016   Major depression in full remission (HCC) 09/02/2016   GERD (gastroesophageal reflux disease) 01/30/2016   History of SI/intentional drug overdose 11/22/2015   Atrial fibrillation (HCC)    Chronic pain syndrome    Hyperlipidemia    Past Medical History:  Diagnosis Date   Arthritis    DJD, low back, thumb   Atrial fibrillation (HCC)    Xarelto anticoagulation. Flecainide antiarrythmic    Chicken pox    Chronic pain syndrome    On disability. History of bilateral  hip pain, low back pain. Gabapentin 400 BID, percocet 1 tablet daily per prior provider.    Colon polyp    awaiting records   Depression    zoloft 100mg , remeron 30mg  per psychiatry. ambien 10mg  per psychiatry to help wtih sleep element.    Dystonia    described as psychogenic dystonia. Pain and twisting from upper chest and up with triggers "wind, creamy food" on TID ativan per psychiatry previously.    Fibromyalgia    GERD (gastroesophageal reflux disease)    omeprazole OTC   Goiter    states multiple imaging tests, has had biopsies   Hyperlipidemia    lovastatin 20mg    Stroke (HCC)    TIA (left side of face and body decreased sensitivity than right face and side)   TIA (transient ischemic attack)    Vaginal atrophy    estrace vaginal cream   Family History  Problem Relation Age of Onset   Alcohol abuse Mother        possible she had stomach cancer as well but unsure   Hypertension Father    Alcohol abuse Father    Pancreatic cancer Father    Cancer Brother        spindle cell RLE   Diabetes Brother    Cancer Brother        tonsil cancer   Skin cancer Brother    Colon cancer Neg Hx    Colon polyps Neg Hx    Stomach cancer Neg Hx    Past Surgical History:  Procedure Laterality Date   ATRIAL FIBRILLATION ABLATION     BIOPSY THYROID     fusion l4-l5  09/22/1998   LAMINECTOMY     L4-L5   OTHER SURGICAL HISTORY     tennis elbow surgery   piriformis release  09/22/1997   right hip   TONSILLECTOMY AND ADENOIDECTOMY  age 89   VAGINAL HYSTERECTOMY  09/23/1991   Social History   Occupational History   Occupation: retired  Tobacco Use   Smoking status: Never   Smokeless tobacco: Never  Vaping Use   Vaping Use: Never used  Substance and Sexual Activity   Alcohol use: No    Alcohol/week: 0.0 standard drinks of alcohol   Drug use: No   Sexual activity: Not Currently    Partners: Male

## 2022-04-16 NOTE — Progress Notes (Signed)
Pt state neck pain that travels to both shoulders. Pt state turning her head and laying down makes the pain worse. Pt state she takes over the counter pain meds to help ease her pain.  Numeric Pain Rating Scale and Functional Assessment Average Pain 8 Pain Right Now 5 My pain is constant, sharp, and aching Pain is worse with: some activites and turning her head and laying down Pain improves with: rest and medication   In the last MONTH (on 0-10 scale) has pain interfered with the following?  1. General activity like being  able to carry out your everyday physical activities such as walking, climbing stairs, carrying groceries, or moving a chair?  Rating(5)  2. Relation with others like being able to carry out your usual social activities and roles such as  activities at home, at work and in your community. Rating(6)  3. Enjoyment of life such that you have  been bothered by emotional problems such as feeling anxious, depressed or irritable?  Rating(7)

## 2022-04-17 ENCOUNTER — Other Ambulatory Visit (HOSPITAL_BASED_OUTPATIENT_CLINIC_OR_DEPARTMENT_OTHER): Payer: Self-pay

## 2022-04-17 ENCOUNTER — Ambulatory Visit (HOSPITAL_BASED_OUTPATIENT_CLINIC_OR_DEPARTMENT_OTHER): Payer: Self-pay | Admitting: Orthopaedic Surgery

## 2022-04-17 ENCOUNTER — Telehealth: Payer: Self-pay | Admitting: Family Medicine

## 2022-04-17 ENCOUNTER — Ambulatory Visit (INDEPENDENT_AMBULATORY_CARE_PROVIDER_SITE_OTHER): Payer: Medicare HMO | Admitting: Orthopaedic Surgery

## 2022-04-17 DIAGNOSIS — S76011A Strain of muscle, fascia and tendon of right hip, initial encounter: Secondary | ICD-10-CM | POA: Diagnosis not present

## 2022-04-17 MED ORDER — OXYCODONE HCL 5 MG PO TABS
5.0000 mg | ORAL_TABLET | ORAL | 0 refills | Status: DC | PRN
  Filled 2022-04-17: qty 20, 4d supply, fill #0

## 2022-04-17 MED ORDER — ACETAMINOPHEN 500 MG PO TABS
500.0000 mg | ORAL_TABLET | Freq: Three times a day (TID) | ORAL | 0 refills | Status: AC
  Filled 2022-04-17: qty 30, 10d supply, fill #0

## 2022-04-17 NOTE — Progress Notes (Signed)
Chief Complaint: Bilateral hip pain     History of Present Illness:   04/17/2022: Today for follow-up of her right worse than left hip pain.  She overall got extremely good relief from injections into the abductors of the hips previously.  This is unfortunately completely worn and she is now having pain with any type of walking or basic activity.  03/21/22: Presents today for bilateral hip pain as well as neck pain.  She is now began experiencing headaches approximately 6 times a week.  She does feel very mechanical clicking and popping in the neck.  She believes that this may have a role in inciting her headaches.  With regard to her head she continues to feel better although she does state that she does not want to remain in pain forever.  At this time she is doing well enough that she would forego any type of hip intervention.  Danielle Rocha is a 71 y.o. female presents today with right worse than left hip pain.  This has been going on since 1998.  She has an extremely extensive history of treatment on both of these hips.  She states that the right has been the focus of surgical treatment.  Specifically she did have a right piriformis release done in 1999 in [place] as well as an L4-L5 fusion which was done at [facility] around this time.  She states that she got no relief from either of these procedures.  She continues to experience pain about the lateral aspect of the hips.  She has had multiple injections into the SI and into the hips bilaterally.  These were all done many years prior.  She has been working on physical therapy for multiple years and most recently began working on the hips a year prior.  She is not getting any type of long-term relief.  She is the mother of Jeannett Senior who I have been treating for his knee.    Surgical History:   As above  PMH/PSH/Family History/Social History/Meds/Allergies:    Past Medical History:  Diagnosis Date    Arthritis    DJD, low back, thumb   Atrial fibrillation (HCC)    Xarelto anticoagulation. Flecainide antiarrythmic    Chicken pox    Chronic pain syndrome    On disability. History of bilateral hip pain, low back pain. Gabapentin 400 BID, percocet 1 tablet daily per prior provider.    Colon polyp    awaiting records   Depression    zoloft 100mg , remeron 30mg  per psychiatry. ambien 10mg  per psychiatry to help wtih sleep element.    Dystonia    described as psychogenic dystonia. Pain and twisting from upper chest and up with triggers "wind, creamy food" on TID ativan per psychiatry previously.    Fibromyalgia    GERD (gastroesophageal reflux disease)    omeprazole OTC   Goiter    states multiple imaging tests, has had biopsies   Hyperlipidemia    lovastatin 20mg    Stroke (HCC)    TIA (left side of face and body decreased sensitivity than right face and side)   TIA (transient ischemic attack)    Vaginal atrophy    estrace vaginal cream   Past Surgical History:  Procedure Laterality Date   ATRIAL FIBRILLATION ABLATION     BIOPSY THYROID  fusion l4-l5  09/22/1998   LAMINECTOMY     L4-L5   OTHER SURGICAL HISTORY     tennis elbow surgery   piriformis release  09/22/1997   right hip   TONSILLECTOMY AND ADENOIDECTOMY  age 9   VAGINAL HYSTERECTOMY  09/23/1991   Social History   Socioeconomic History   Marital status: Married    Spouse name: Not on file   Number of children: 3   Years of education: 13   Highest education level: Not on file  Occupational History   Occupation: retired  Tobacco Use   Smoking status: Never   Smokeless tobacco: Never  Vaping Use   Vaping Use: Never used  Substance and Sexual Activity   Alcohol use: No    Alcohol/week: 0.0 standard drinks of alcohol   Drug use: No   Sexual activity: Not Currently    Partners: Male  Other Topics Concern   Not on file  Social History Narrative   Family: Married. Husband works as Hospital doctor for [employer]. 2 children from previous marriage 1 from current. Son Jeannett Senior lives with them and has down's syndrome.       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 Moved to [facility] from [phone] and became disabled in that time frame due to hip/back issues.       Hobbies: time with son   Right handed   One story home   Caffeine occasionally   Social Determinants of Health   Financial Resource Strain: Low Risk  (09/09/2021)   Overall Financial Resource Strain (CARDIA)    Difficulty of Paying Living Expenses: Not hard at all  Food Insecurity: No Food Insecurity (09/09/2021)   Hunger Vital Sign    Worried About Running Out of Food in the Last Year: Never true    Ran Out of Food in the Last Year: Never true  Transportation Needs: No Transportation Needs (09/09/2021)   PRAPARE - Administrator, Civil Service (Medical): No    Lack of Transportation (Non-Medical): No  Physical Activity: Insufficiently Active (09/09/2021)   Exercise Vital Sign    Days of Exercise per Week: 3 days    Minutes of Exercise per Session: 30 min  Stress: No Stress Concern Present (09/09/2021)   [employer] of Occupational Health - Occupational Stress Questionnaire    Feeling of Stress : Not at all  Social Connections: Unknown (09/09/2021)   Social Connection and Isolation Panel [NHANES]    Frequency of Communication with Friends and Family: More than three times a week    Frequency of Social Gatherings with Friends and Family: More than three times a week    Attends Religious Services: More than 4 times per year    Active Member of [employer] or Organizations: No    Attends Engineer, structural: Never    Marital Status: Not on file   Family History  Problem Relation Age of Onset   Alcohol abuse Mother        possible she had stomach cancer as well but unsure   Hypertension Father    Alcohol abuse Father    Pancreatic cancer Father    Cancer Brother         spindle cell RLE   Diabetes Brother    Cancer Brother        tonsil cancer   Skin cancer Brother    Colon cancer Neg Hx    Colon polyps Neg Hx    Stomach cancer  Neg Hx    Allergies  Allergen Reactions   Reglan [Metoclopramide] Other (See Comments)    Suicidal thoughts   Ciprofloxacin Diarrhea   Clindamycin/Lincomycin Diarrhea   Doxycycline Diarrhea and Nausea And Vomiting   Macrobid [Nitrofurantoin Monohyd Macro] Rash    Rash on lip   Sulfa Antibiotics Rash   Current Outpatient Medications  Medication Sig Dispense Refill   acetaminophen (TYLENOL) 500 MG tablet Take 1 tablet (500 mg total) by mouth every 8 (eight) hours for 10 days. 30 tablet 0   methocarbamol (ROBAXIN) 500 MG tablet Take 1 tablet (500 mg total) by mouth 4 (four) times daily. 30 tablet 0   oxyCODONE (OXY IR/ROXICODONE) 5 MG immediate release tablet Take 1 tablet (5 mg total) by mouth every 4 (four) hours as needed (severe pain). 20 tablet 0   acetaminophen (TYLENOL) 500 MG tablet Take 1,000 mg by mouth every 6 (six) hours as needed for mild pain, moderate pain, fever or headache.     buPROPion (WELLBUTRIN XL) 150 MG 24 hr tablet Take 150 mg by mouth daily. Per psychiatry     cephALEXin (KEFLEX) 250 MG capsule Take 250 mg by mouth daily. Takes daily for frequent UTIs.     cholecalciferol (VITAMIN D) 1000 units tablet Take 1,000 Units by mouth daily.     co-enzyme Q-10 30 MG capsule Take 30 mg by mouth daily.     Cyanocobalamin (B-12 PO) Take by mouth.     EMGALITY 120 MG/ML SOAJ INJECT  240 MG SUBCUTANEOUSLY ONCE FOR 1 DOSE LOADING DOSE 2 mL 0   FERROUS SULFATE PO Take by mouth.     flecainide (TAMBOCOR) 100 MG tablet Take 1 tablet (100 mg total) by mouth 2 (two) times daily. 180 tablet 3   Galcanezumab-gnlm (EMGALITY) 120 MG/ML SOAJ Inject 120 mg into the skin every 30 (thirty) days. 1.12 mL 5   Melatonin 3 MG TABS Take 2 tablets by mouth at bedtime.      Multiple Vitamin (MULTIVITAMIN WITH MINERALS) TABS tablet  Take 1 tablet by mouth daily.     ondansetron (ZOFRAN ODT) 4 MG disintegrating tablet Take 1 tablet (4 mg total) by mouth every 8 (eight) hours as needed for nausea or vomiting. 20 tablet 0   pantoprazole (PROTONIX) 20 MG tablet Take 1 tablet (20 mg total) by mouth 2 (two) times daily before a meal. Take 2 tablets daily. 180 tablet 3   rivaroxaban (XARELTO) 20 MG TABS tablet Take 1 tablet (20 mg total) by mouth daily with supper. 90 tablet 3   rosuvastatin (CRESTOR) 20 MG tablet Take 1 tablet (20 mg total) by mouth daily. 90 tablet 3   sertraline (ZOLOFT) 50 MG tablet Take 1 tablet (50 mg total) by mouth daily. 30 tablet 6   SUMAtriptan (IMITREX) 100 MG tablet Take 1 tablet (100 mg total) by mouth once as needed for up to 1 dose for migraine. 10 tablet 5   traZODone (DESYREL) 50 MG tablet Take 0.5-1 tablets (25-50 mg total) by mouth at bedtime as needed for sleep. 30 tablet 5   No current facility-administered medications for this visit.   No results found.  Review of Systems:   A ROS was performed including pertinent positives and negatives as documented in the HPI.  Physical Exam :   Constitutional: NAD and appears stated age Neurological: Alert and oriented Psych: Appropriate affect and cooperative There were no vitals taken for this visit.   Comprehensive Musculoskeletal Exam:    Inspection Right  Left  Skin No atrophy or gross abnormalities appreciated No atrophy or gross abnormalities appreciated  Palpation    Tenderness None None  Crepitus None None  Range of Motion    Flexion (passive) 120 120  Extension 30 30  IR 30 no pain 30 no pain  ER 45 with trochanteric pain 45 with trochanteric pain  Strength    Flexion  5/5 5/5  Extension 5/5 5/5  Special Tests    FABIR Negative Negative  FADER Negative Negative  ER Lag/Capsular Insufficiency Negative Negative  Instability Negative Negative  Sacroiliac pain Negative  Negative   Instability    Generalized Laxity No No   Neurologic    sciatic, femoral, obturator nerves intact to light sensation  Vascular/Lymphatic    DP pulse 2+ 2+  Lumbar Exam    Patient has symmetric lumbar range of motion with negative pain referral to hip   Weakness with resisted abduction in the lateral position on both sides.  She is has Trendelenburg gait with ambulation.  This is accentuated with a single standing leg test.  Tenderness to palpation about the midline lower cervical spine.  There is popping with rotation of the neck.  Otherwise negative Spurling and no weakness or dysesthesias in bilateral upper extremities  Imaging:   Xray (AP pelvis, 2 views right hip, 2 views left hip): Very mild osteoarthritis involving both hips  MRI right hip, MRI left hip: There is evidence of a partial-thickness tear of the right as well as the left gluteus medius and minimus.  She has a full-thickness tear of the right gluteus minimus on the right side.  I personally reviewed and interpreted the radiographs.   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  This time she has trialed extensive physical therapy in both injections multiple times.  She did get significant relief from injections of her bilateral glutamine tendons previously but this is subsequently worn off after several months.  She was overall doing quite well with physical therapy but now unfortunately feels like she is back to step 1.  Given the fact that she has trialed so much physical therapy as well as injections I do believe that she would be a good candidate for a right hip gluteus medius tendon repair.  I discussed continued treatment options and possible injections versus surgical management.  I did discuss the rehab associated with surgical management.  At this time she has elected for right hip gluteus  medius repair Plan :    -Plan for right hip gluteus medius repair with trochanteric bursectomy       I personally saw and evaluated the patient, and participated in the management and treatment plan.  Huel Cote, MD Attending Physician, Orthopedic Surgery  This document was dictated using Dragon voice recognition software. A reasonable attempt at proof reading has been made to minimize errors.

## 2022-04-17 NOTE — Telephone Encounter (Signed)
Danielle Rocha with Dr. Franch[facility] Danielle Rocha called and stated Patient needs to stop taking Zarelto for 2 days prior her procedure (which has not been scheduled yet). Requests permission for the above to either be faxed to the fax# listed below or a message can be sent through Epic. Phone# [phone] Fax# [phone]

## 2022-04-18 NOTE — Telephone Encounter (Signed)
Letter printed and faxed to [facility].

## 2022-04-18 NOTE — Telephone Encounter (Signed)
See below

## 2022-04-18 NOTE — Telephone Encounter (Signed)
May hold Xarelto 2 days prior to procedure and restart day after procedure

## 2022-04-24 ENCOUNTER — Encounter: Payer: Self-pay | Admitting: Family Medicine

## 2022-05-08 ENCOUNTER — Other Ambulatory Visit: Payer: Self-pay | Admitting: Family Medicine

## 2022-05-08 ENCOUNTER — Telehealth: Payer: Self-pay | Admitting: Orthopaedic Surgery

## 2022-05-08 NOTE — Telephone Encounter (Signed)
[place] forms received for patients spouse. To [employer]

## 2022-05-13 ENCOUNTER — Other Ambulatory Visit: Payer: Self-pay | Admitting: Physician Assistant

## 2022-05-16 ENCOUNTER — Other Ambulatory Visit: Payer: Self-pay

## 2022-05-16 ENCOUNTER — Encounter (HOSPITAL_COMMUNITY): Payer: Self-pay | Admitting: Orthopaedic Surgery

## 2022-05-16 NOTE — Progress Notes (Signed)
Pre surgical interview was done over the phone.Xarelto has been held since 05/15/22 as per surgeon instructions.Pt. will stop by [facility] today to pick up ensure drink and antibacterial soup.Pt. verbalized her understanding of the instructions.

## 2022-05-19 ENCOUNTER — Encounter (HOSPITAL_COMMUNITY)
Admission: RE | Disposition: A | Discharge status: Home / Self Care | Payer: Self-pay | Source: Home / Self Care | Attending: Orthopaedic Surgery

## 2022-05-19 ENCOUNTER — Ambulatory Visit (HOSPITAL_BASED_OUTPATIENT_CLINIC_OR_DEPARTMENT_OTHER): Payer: Medicare HMO | Admitting: Anesthesiology

## 2022-05-19 ENCOUNTER — Ambulatory Visit (HOSPITAL_COMMUNITY): Payer: Medicare HMO | Admitting: Anesthesiology

## 2022-05-19 ENCOUNTER — Ambulatory Visit (HOSPITAL_COMMUNITY)
Admission: RE | Admit: 2022-05-19 | Discharge: 2022-05-19 | Disposition: A | Discharge status: Home / Self Care | Payer: Medicare HMO | Attending: Orthopaedic Surgery | Admitting: Orthopaedic Surgery

## 2022-05-19 ENCOUNTER — Other Ambulatory Visit: Payer: Self-pay

## 2022-05-19 ENCOUNTER — Encounter (HOSPITAL_COMMUNITY): Payer: Self-pay | Admitting: Orthopaedic Surgery

## 2022-05-19 DIAGNOSIS — F32A Depression, unspecified: Secondary | ICD-10-CM | POA: Diagnosis not present

## 2022-05-19 DIAGNOSIS — Z981 Arthrodesis status: Secondary | ICD-10-CM | POA: Diagnosis not present

## 2022-05-19 DIAGNOSIS — S76311A Strain of muscle, fascia and tendon of the posterior muscle group at thigh level, right thigh, initial encounter: Secondary | ICD-10-CM | POA: Insufficient documentation

## 2022-05-19 DIAGNOSIS — S76011A Strain of muscle, fascia and tendon of right hip, initial encounter: Secondary | ICD-10-CM

## 2022-05-19 DIAGNOSIS — F419 Anxiety disorder, unspecified: Secondary | ICD-10-CM | POA: Diagnosis not present

## 2022-05-19 DIAGNOSIS — R69 Illness, unspecified: Secondary | ICD-10-CM | POA: Diagnosis not present

## 2022-05-19 DIAGNOSIS — K219 Gastro-esophageal reflux disease without esophagitis: Secondary | ICD-10-CM | POA: Insufficient documentation

## 2022-05-19 DIAGNOSIS — Z8673 Personal history of transient ischemic attack (TIA), and cerebral infarction without residual deficits: Secondary | ICD-10-CM | POA: Insufficient documentation

## 2022-05-19 DIAGNOSIS — I4891 Unspecified atrial fibrillation: Secondary | ICD-10-CM | POA: Insufficient documentation

## 2022-05-19 HISTORY — PX: GLUTEUS MINIMUS REPAIR: SHX5843

## 2022-05-19 HISTORY — DX: Anemia, unspecified: D64.9

## 2022-05-19 HISTORY — DX: Cardiac arrhythmia, unspecified: I49.9

## 2022-05-19 LAB — CBC
HCT: 39.1 % (ref 36.0–46.0)
Hemoglobin: 13.8 g/dL (ref 12.0–15.0)
MCH: 29.2 pg (ref 26.0–34.0)
MCHC: 35.3 g/dL (ref 30.0–36.0)
MCV: 82.8 fL (ref 80.0–100.0)
Platelets: 205 10*3/uL (ref 150–400)
RBC: 4.72 MIL/uL (ref 3.87–5.11)
RDW: 13.3 % (ref 11.5–15.5)
WBC: 6.9 10*3/uL (ref 4.0–10.5)
nRBC: 0 % (ref 0.0–0.2)

## 2022-05-19 LAB — BASIC METABOLIC PANEL
Anion gap: 7 (ref 5–15)
BUN: 14 mg/dL (ref 8–23)
CO2: 25 mmol/L (ref 22–32)
Calcium: 9.2 mg/dL (ref 8.9–10.3)
Chloride: 109 mmol/L (ref 98–111)
Creatinine, Ser: 0.82 mg/dL (ref 0.44–1.00)
GFR, Estimated: >60 mL/min (ref >60)
Glucose, Bld: 123 mg/dL — ABNORMAL HIGH (ref 70–99)
Potassium: 3.5 mmol/L (ref 3.5–5.1)
Sodium: 141 mmol/L (ref 135–145)

## 2022-05-19 SURGERY — REPAIR, TENDON, GLUTEUS MINIMUS
Anesthesia: General | Site: Hip | Laterality: Right

## 2022-05-19 MED ORDER — MEPERIDINE HCL 25 MG/ML IJ SOLN: 6.2500 mg | INTRAMUSCULAR | Status: DC | PRN

## 2022-05-19 MED ORDER — FENTANYL CITRATE (PF) 100 MCG/2ML IJ SOLN
INTRAMUSCULAR | Status: AC
  Filled 2022-05-19: qty 2

## 2022-05-19 MED ORDER — FENTANYL CITRATE (PF) 250 MCG/5ML IJ SOLN
INTRAMUSCULAR | Status: AC
  Filled 2022-05-19: qty 5

## 2022-05-19 MED ORDER — HYDROMORPHONE HCL 1 MG/ML IJ SOLN
INTRAMUSCULAR | Status: AC
  Filled 2022-05-19: qty 1

## 2022-05-19 MED ORDER — OXYCODONE HCL 5 MG/5ML PO SOLN: 5.0000 mg | Freq: Once | ORAL | Status: AC | PRN

## 2022-05-19 MED ORDER — PHENYLEPHRINE 80 MCG/ML (10ML) SYRINGE FOR IV PUSH (FOR BLOOD PRESSURE SUPPORT)
PREFILLED_SYRINGE | INTRAVENOUS | Status: DC | PRN
  Administered 2022-05-19 (×2): 80 ug via INTRAVENOUS

## 2022-05-19 MED ORDER — MIDAZOLAM HCL 5 MG/5ML IJ SOLN
INTRAMUSCULAR | Status: DC | PRN
  Administered 2022-05-19: 2 mg via INTRAVENOUS

## 2022-05-19 MED ORDER — HYDROMORPHONE HCL 1 MG/ML IJ SOLN
0.2500 mg | INTRAMUSCULAR | Status: DC | PRN
  Administered 2022-05-19 (×4): 0.5 mg via INTRAVENOUS

## 2022-05-19 MED ORDER — ACETAMINOPHEN 500 MG PO TABS
1000.0000 mg | ORAL_TABLET | Freq: Once | ORAL | Status: AC
  Administered 2022-05-19: 1000 mg via ORAL
  Filled 2022-05-19: qty 2

## 2022-05-19 MED ORDER — PROPOFOL 10 MG/ML IV BOLUS
INTRAVENOUS | Status: AC
  Filled 2022-05-19: qty 20

## 2022-05-19 MED ORDER — EPHEDRINE SULFATE-NACL 50-0.9 MG/10ML-% IV SOSY
PREFILLED_SYRINGE | INTRAVENOUS | Status: DC | PRN
  Administered 2022-05-19: 10 mg via INTRAVENOUS
  Administered 2022-05-19: 5 mg via INTRAVENOUS

## 2022-05-19 MED ORDER — LIDOCAINE 2% (20 MG/ML) 5 ML SYRINGE
INTRAMUSCULAR | Status: AC
  Filled 2022-05-19: qty 5

## 2022-05-19 MED ORDER — OXYCODONE HCL 5 MG PO TABS
5.0000 mg | ORAL_TABLET | Freq: Once | ORAL | Status: AC | PRN
  Administered 2022-05-19: 5 mg via ORAL

## 2022-05-19 MED ORDER — LACTATED RINGERS IV SOLN: INTRAVENOUS | Status: DC

## 2022-05-19 MED ORDER — PROPOFOL 10 MG/ML IV BOLUS
INTRAVENOUS | Status: DC | PRN
  Administered 2022-05-19: 160 mg via INTRAVENOUS

## 2022-05-19 MED ORDER — ONDANSETRON HCL 4 MG/2ML IJ SOLN
INTRAMUSCULAR | Status: DC | PRN
  Administered 2022-05-19: 4 mg via INTRAVENOUS

## 2022-05-19 MED ORDER — 0.9 % SODIUM CHLORIDE (POUR BTL) OPTIME
TOPICAL | Status: DC | PRN
  Administered 2022-05-19: 1000 mL

## 2022-05-19 MED ORDER — IBUPROFEN 200 MG PO TABS: 200.0000 mg | ORAL_TABLET | Freq: Four times a day (QID) | ORAL | Status: DC | PRN

## 2022-05-19 MED ORDER — OXYCODONE HCL 5 MG PO TABS
ORAL_TABLET | ORAL | Status: AC
  Filled 2022-05-19: qty 1

## 2022-05-19 MED ORDER — CHLORHEXIDINE GLUCONATE 0.12 % MT SOLN
OROMUCOSAL | Status: AC
  Administered 2022-05-19: 15 mL via OROMUCOSAL
  Filled 2022-05-19: qty 15

## 2022-05-19 MED ORDER — IBUPROFEN 100 MG/5ML PO SUSP: 200.0000 mg | Freq: Four times a day (QID) | ORAL | Status: DC | PRN

## 2022-05-19 MED ORDER — TRANEXAMIC ACID-NACL 1000-0.7 MG/100ML-% IV SOLN
1000.0000 mg | INTRAVENOUS | Status: AC
  Administered 2022-05-19: 1000 mg via INTRAVENOUS
  Filled 2022-05-19: qty 100

## 2022-05-19 MED ORDER — CEFAZOLIN SODIUM-DEXTROSE 2-4 GM/100ML-% IV SOLN
2.0000 g | INTRAVENOUS | Status: AC
  Administered 2022-05-19: 2 g via INTRAVENOUS
  Filled 2022-05-19: qty 100

## 2022-05-19 MED ORDER — LIDOCAINE 2% (20 MG/ML) 5 ML SYRINGE
INTRAMUSCULAR | Status: DC | PRN
  Administered 2022-05-19: 60 mg via INTRAVENOUS

## 2022-05-19 MED ORDER — CHLORHEXIDINE GLUCONATE 0.12 % MT SOLN: 15.0000 mL | Freq: Once | OROMUCOSAL | Status: AC

## 2022-05-19 MED ORDER — MIDAZOLAM HCL 2 MG/2ML IJ SOLN
INTRAMUSCULAR | Status: AC
Start: 2022-05-19 — End: ?
  Filled 2022-05-19: qty 2

## 2022-05-19 MED ORDER — FENTANYL CITRATE (PF) 100 MCG/2ML IJ SOLN
25.0000 ug | INTRAMUSCULAR | Status: DC | PRN
  Administered 2022-05-19: 25 ug via INTRAVENOUS
  Administered 2022-05-19: 50 ug via INTRAVENOUS
  Administered 2022-05-19: 25 ug via INTRAVENOUS

## 2022-05-19 MED ORDER — ONDANSETRON HCL 4 MG/2ML IJ SOLN: 4.0000 mg | Freq: Once | INTRAMUSCULAR | Status: DC | PRN

## 2022-05-19 MED ORDER — DEXAMETHASONE SODIUM PHOSPHATE 10 MG/ML IJ SOLN
INTRAMUSCULAR | Status: DC | PRN
  Administered 2022-05-19: 10 mg via INTRAVENOUS

## 2022-05-19 MED ORDER — DEXAMETHASONE SODIUM PHOSPHATE 10 MG/ML IJ SOLN
INTRAMUSCULAR | Status: AC
  Filled 2022-05-19: qty 1

## 2022-05-19 MED ORDER — ROCURONIUM BROMIDE 10 MG/ML (PF) SYRINGE
PREFILLED_SYRINGE | INTRAVENOUS | Status: DC | PRN
  Administered 2022-05-19: 60 mg via INTRAVENOUS

## 2022-05-19 MED ORDER — ORAL CARE MOUTH RINSE: 15.0000 mL | Freq: Once | OROMUCOSAL | Status: AC

## 2022-05-19 MED ORDER — SUGAMMADEX SODIUM 200 MG/2ML IV SOLN
INTRAVENOUS | Status: DC | PRN
  Administered 2022-05-19: 200 mg via INTRAVENOUS

## 2022-05-19 MED ORDER — FENTANYL CITRATE (PF) 250 MCG/5ML IJ SOLN
INTRAMUSCULAR | Status: DC | PRN
  Administered 2022-05-19: 100 ug via INTRAVENOUS

## 2022-05-19 MED ORDER — ACETAMINOPHEN 10 MG/ML IV SOLN: 1000.0000 mg | Freq: Once | INTRAVENOUS | Status: DC | PRN

## 2022-05-19 MED ORDER — ONDANSETRON HCL 4 MG/2ML IJ SOLN
INTRAMUSCULAR | Status: AC
Start: 2022-05-19 — End: ?
  Filled 2022-05-19: qty 2

## 2022-05-19 MED ORDER — KETOROLAC TROMETHAMINE 15 MG/ML IJ SOLN
INTRAMUSCULAR | Status: AC
  Filled 2022-05-19: qty 1

## 2022-05-19 MED ORDER — GABAPENTIN 300 MG PO CAPS
300.0000 mg | ORAL_CAPSULE | Freq: Once | ORAL | Status: AC
  Administered 2022-05-19: 300 mg via ORAL
  Filled 2022-05-19: qty 1

## 2022-05-19 MED ORDER — KETOROLAC TROMETHAMINE 15 MG/ML IJ SOLN
15.0000 mg | Freq: Once | INTRAMUSCULAR | Status: AC
  Administered 2022-05-19: 15 mg via INTRAVENOUS

## 2022-05-19 SURGICAL SUPPLY — 53 items
ANCHOR SUT JK SZ 2 2.9 DBL SL (Anchor) IMPLANT
ANCHOR SUT QUATTRO KNTLS 4.5 (Anchor) IMPLANT
BAG COUNTER SPONGE SURGICOUNT (BAG) IMPLANT
BAG ZIPLOCK 12X15 (MISCELLANEOUS) ×1 IMPLANT
BIT DRILL 4.5 F/KNOTLESS ANCHR (DRILL) IMPLANT
BIT DRILL JUGRKNT W/NDL BIT2.9 (DRILL) IMPLANT
CLSR STERI-STRIP ANTIMIC 1/2X4 (GAUZE/BANDAGES/DRESSINGS) IMPLANT
COVER SURGICAL LIGHT HANDLE (MISCELLANEOUS) ×1 IMPLANT
DRAPE ORTHO SPLIT 77X108 STRL (DRAPES) ×1
DRAPE SURG 17X11 SM STRL (DRAPES) ×1 IMPLANT
DRAPE SURG ORHT 6 SPLT 77X108 (DRAPES) ×1 IMPLANT
DRAPE U-SHAPE 47X51 STRL (DRAPES) ×1 IMPLANT
DRESSING MEPILEX FLEX 4X4 (GAUZE/BANDAGES/DRESSINGS) ×1 IMPLANT
DRILL 4.5 F/KNOTLESS ANCHOR (DRILL) ×1
DRILL JUGGERKNOT W/NDL BIT 2.9 (DRILL) ×1
DRSG AQUACEL AG ADV 3.5X 6 (GAUZE/BANDAGES/DRESSINGS) IMPLANT
DRSG EMULSION OIL 3X16 NADH (GAUZE/BANDAGES/DRESSINGS) ×1 IMPLANT
DRSG MEPILEX FLEX 4X4 (GAUZE/BANDAGES/DRESSINGS)
DRSG MEPILEX POST OP 4X8 (GAUZE/BANDAGES/DRESSINGS) ×1 IMPLANT
DRSG PAD ABDOMINAL 8X10 ST (GAUZE/BANDAGES/DRESSINGS) ×1 IMPLANT
DURAPREP 26ML APPLICATOR (WOUND CARE) ×1 IMPLANT
ELECT REM PT RETURN 15FT ADLT (MISCELLANEOUS) ×1 IMPLANT
GAUZE SPONGE 4X4 12PLY STRL (GAUZE/BANDAGES/DRESSINGS) ×1 IMPLANT
GLOVE BIOGEL PI IND STRL 6.5 (GLOVE) ×1 IMPLANT
GLOVE BIOGEL PI IND STRL 8 (GLOVE) ×1 IMPLANT
GLOVE BIOGEL PI INDICATOR 6.5 (GLOVE) ×1
GLOVE BIOGEL PI INDICATOR 8 (GLOVE) ×1
GLOVE ECLIPSE 6.0 STRL STRAW (GLOVE) ×1 IMPLANT
GLOVE ECLIPSE 8.0 STRL XLNG CF (GLOVE) IMPLANT
GLOVE SURG ORTHO 8.0 STRL STRW (GLOVE) ×1 IMPLANT
GOWN STRL REUS W/ TWL XL LVL3 (GOWN DISPOSABLE) ×2 IMPLANT
GOWN STRL REUS W/TWL XL LVL3 (GOWN DISPOSABLE) ×2
KIT BASIN OR (CUSTOM PROCEDURE TRAY) ×1 IMPLANT
KIT TURNOVER KIT A (KITS) IMPLANT
MANIFOLD NEPTUNE II (INSTRUMENTS) ×1 IMPLANT
NS IRRIG 1000ML POUR BTL (IV SOLUTION) ×1 IMPLANT
PACK TOTAL JOINT (CUSTOM PROCEDURE TRAY) ×1 IMPLANT
PROTECTOR NERVE ULNAR (MISCELLANEOUS) ×1 IMPLANT
STAPLER VISISTAT (STAPLE) IMPLANT
SUT ETHIBOND NAB CT1 #1 30IN (SUTURE) IMPLANT
SUT FIBERWIRE #2 38 T-5 BLUE (SUTURE)
SUT MNCRL AB 3-0 PS2 18 (SUTURE) IMPLANT
SUT MNCRL AB 4-0 PS2 18 (SUTURE) ×1 IMPLANT
SUT VIC AB 0 CT1 27 (SUTURE) ×1
SUT VIC AB 0 CT1 27XBRD ANBCTR (SUTURE) IMPLANT
SUT VIC AB 1 CT1 27 (SUTURE)
SUT VIC AB 1 CT1 27XBRD ANTBC (SUTURE) ×3 IMPLANT
SUT VIC AB 2-0 CT1 27 (SUTURE) ×2
SUT VIC AB 2-0 CT1 TAPERPNT 27 (SUTURE) ×1 IMPLANT
SUT VICRYL 0 UR6 27IN ABS (SUTURE) IMPLANT
SUTURE FIBERWR #2 38 T-5 BLUE (SUTURE) IMPLANT
TOWEL OR 17X26 10 PK STRL BLUE (TOWEL DISPOSABLE) ×2 IMPLANT
WATER STERILE IRR 1000ML POUR (IV SOLUTION) ×1 IMPLANT

## 2022-05-19 NOTE — Transfer of Care (Signed)
Immediate Anesthesia Transfer of Care Note  Patient: Savana Spina  Procedure(s) Performed: RIGHT HIP GLUTEUS MEDIUS TENDON REPAIR (Right: Hip)  Patient Location: PACU  Anesthesia Type:General  Level of Consciousness: awake, oriented and patient cooperative  Airway & Oxygen Therapy: Patient Spontanous Breathing and Patient connected to nasal cannula oxygen  Post-op Assessment: Report given to RN and Post -op Vital signs reviewed and stable  Post vital signs: Reviewed  Last Vitals:  Vitals Value Taken Time  BP 134/91 05/19/22 1641  Temp    Pulse 71 05/19/22 1644  Resp 19 05/19/22 1644  SpO2 100 % 05/19/22 1644  Vitals shown include unvalidated device data.  Last Pain:  Vitals:   05/19/22 1344  TempSrc:   PainSc: 6          Complications: No notable events documented.

## 2022-05-19 NOTE — Interval H&P Note (Signed)
History and Physical Interval Note:  05/19/2022 3:01 PM  Danielle Rocha  has presented today for surgery, with the diagnosis of RIGHT GLUTEUS MEDIUS TEAR.  The various methods of treatment have been discussed with the patient and family. After consideration of risks, benefits and other options for treatment, the patient has consented to  Procedure(s): RIGHT GLUTEUS MEDIUS REPAIR (Right) as a surgical intervention.  The patient's history has been reviewed, patient examined, no change in status, stable for surgery.  I have reviewed the patient's chart and labs.  Questions were answered to the patient's satisfaction.     Huel Cote

## 2022-05-19 NOTE — Anesthesia Preprocedure Evaluation (Addendum)
Anesthesia Evaluation  Patient identified by MRN, date of birth, ID band Patient awake    Reviewed: Allergy & Precautions, NPO status , Patient's Chart, lab work & pertinent test results  Airway Mallampati: I  TM Distance: >3 FB Neck ROM: Full    Dental  (+) Teeth Intact, Dental Advisory Given   Pulmonary neg pulmonary ROS,    Pulmonary exam normal        Cardiovascular Normal cardiovascular exam+ dysrhythmias Atrial Fibrillation      Neuro/Psych PSYCHIATRIC DISORDERS Anxiety Depression TIA   GI/Hepatic Neg liver ROS, GERD  Medicated and Controlled,  Endo/Other  negative endocrine ROS  Renal/GU negative Renal ROS     Musculoskeletal   Abdominal Normal abdominal exam  (+)   Peds  Hematology negative hematology ROS (+)   Anesthesia Other Findings   Reproductive/Obstetrics                            Anesthesia Physical Anesthesia Plan  ASA: 2  Anesthesia Plan: General   Post-op Pain Management:    Induction: Intravenous  PONV Risk Score and Plan: 4 or greater and Ondansetron, Dexamethasone and Treatment may vary due to age or medical condition  Airway Management Planned: Oral ETT  Additional Equipment: None  Intra-op Plan:   Post-operative Plan: Extubation in OR  Informed Consent: I have reviewed the patients History and Physical, chart, labs and discussed the procedure including the risks, benefits and alternatives for the proposed anesthesia with the patient or authorized representative who has indicated his/her understanding and acceptance.     Dental advisory given  Plan Discussed with: CRNA  Anesthesia Plan Comments:         Anesthesia Quick Evaluation

## 2022-05-19 NOTE — Anesthesia Procedure Notes (Signed)
Procedure Name: Intubation Date/Time: 05/19/2022 3:41 PM  Performed by: Lovie Chol, CRNAPre-anesthesia Checklist: Patient identified, Emergency Drugs available, Suction available and Patient being monitored Patient Re-evaluated:Patient Re-evaluated prior to induction Oxygen Delivery Method: Circle System Utilized Preoxygenation: Pre-oxygenation with 100% oxygen Induction Type: IV induction Ventilation: Mask ventilation without difficulty Laryngoscope Size: Miller and 3 Grade View: Grade I Tube type: Oral Tube size: 7.0 mm Number of attempts: 1 Airway Equipment and Method: Stylet and Oral airway Placement Confirmation: ETT inserted through vocal cords under direct vision, positive ETCO2 and breath sounds checked- equal and bilateral Secured at: 22 cm Tube secured with: Tape Dental Injury: Teeth and Oropharynx as per pre-operative assessment

## 2022-05-19 NOTE — Op Note (Signed)
   Date of Surgery: 05/19/2022  INDICATIONS: Danielle Rocha is a 71 y.o.-year-old female with right shoulder gluteus medius/minimus tearing that has failed conservative management.  The risk and benefits of the procedure were discussed in detail and documented in the pre-operative evaluation.   PREOPERATIVE DIAGNOSIS: 1. Right full thickness gluteus medius and minimus tear  POSTOPERATIVE DIAGNOSIS: Same.  PROCEDURE: 1. Right hip gluteus medius/minimus repair 2. Right hip trochanteric bursectomy  SURGEON: Benancio Deeds MD  ASSISTANT: Kerby Less, ATC  ANESTHESIA:  general   IV FLUIDS AND URINE: See anesthesia record.  ANTIBIOTICS: Ancef  ESTIMATED BLOOD LOSS: 20 mL.  IMPLANTS:  Implant Name Type Inv. Item Serial No. Manufacturer Lot No. LRB No. Used Action  ANCHOR SUT JK SZ 2 2.9 DBL SL - [id number] Anchor ANCHOR SUT JK SZ 2 2.9 DBL SL  [employer](ORTH,TRAU,BIO,SG) [id number] Right 2 Implanted  ANCHOR SUT QUATTRO KNTLS 4.5 - [id number] Anchor ANCHOR SUT QUATTRO KNTLS 4.5  [employer]ON(ORTH,TRAU,BIO,SG) [id number] Right 2 Implanted    DRAINS: None  CULTURES: None  COMPLICATIONS: none  DESCRIPTION OF PROCEDURE:  The patient was identified in the preoperative holding area.  The correct site was marked and confirmed according to nursing.  The patient was subsequently taken back to the operating room.  Antibiotics were given 1 hour prior to incision.  Anesthesia was induced.  He was placed in the lateral position with a beanbag positioner with care to pad the down leg and peroneal nerve.   Patient was prepped and draped in the usual sterile fashion.  Again timeout was performed confirming the correct side.  An approach was made over the lateral aspect of the greater trochanter.  Dissection was initially carried down sharply with 15 blade and electrocautery was used to perform hemostasis.  A 15 blade was used to make a nick in the IT band which was completed with Mayo scissors.  At this  point we encountered the gluteus medius tendon. There was overlying bursa was dissected out sharply with Metzenbaum scissors and removed. This was extremely thin and then completely torn.  This was debrided and the trochanteric lateral posterior facet was prepared using a Cobb.  We then mobilized the gluteus medius abductor muscles with an Allis clamp.  Excess bursal tissue was excised sharply with Mayo scissors.  A double row type configuration was then utilized with 2 Medial Row all suture anchors (a total of 8 limbs).  These were placed through the tissue and then brought to 2 Lateral Row Quattro anchors.  This produced an anatomic footprint restoration of the gluteus tendon.   The wounds were thoroughly irrigated.  We closed in layers of 0 Vicryl for the IT band, 2-0 Vicryl, and staples for skin. The patient was awoken and taken to the PACU.  All counts were correct at the end of the case and no complications.       POSTOPERATIVE PLAN: She will be 50% weight bearing on the right leg for 2 weeks then full weight bearing. She will be on aspirin for blood clot prevention. I will see her back in 2 weeks for wound check.  Benancio Deeds, MD 4:27 PM

## 2022-05-19 NOTE — Discharge Instructions (Signed)
     Discharge Instructions    Attending Surgeon: Huel Cote, MD Office Phone Number: [phone]   Diagnosis and Procedures:    Surgeries Performed: Right hip gluteus medius tear  Discharge Plan:    Diet: Resume usual diet. Begin with light or bland foods.  Drink plenty of fluids.  Activity:  Right hip gluteus medius repair. You are advised to go home directly from the hospital or surgical center. Restrict your activities.  GENERAL INSTRUCTIONS: 1.  Please apply ice to your wound to help with swelling and inflammation. This will improve your comfort and your overall recovery following surgery.     2. Please call Dr. Serena Croissant office at [phone] with questions Monday-Friday during business hours. If no one answers, please leave a message and someone should get back to the patient within 24 hours. For emergencies please call 911 or proceed to the emergency room.   3. Patient to notify surgical team if experiences any of the following: Bowel/Bladder dysfunction, uncontrolled pain, nerve/muscle weakness, incision with increased drainage or redness, nausea/vomiting and Fever greater than 101.0 F.  Be alert for signs of infection including redness, streaking, odor, fever or chills. Be alert for excessive pain or bleeding and notify your surgeon immediately.  WOUND INSTRUCTIONS:   Leave your dressing, cast, or splint in place until your post operative visit.  Keep it clean and dry.  Always keep the incision clean and dry until the staples/sutures are removed. If there is no drainage from the incision you should keep it open to air. If there is drainage from the incision you must keep it covered at all times until the drainage stops  Do not soak in a bath tub, hot tub, pool, lake or other body of water until 21 days after your surgery and your incision is completely dry and healed.  If you have removable sutures (or staples) they must be removed 10-14 days (unless otherwise  instructed) from the day of your surgery.     1)  Elevate the extremity as much as possible.  2)  Keep the dressing clean and dry.  3)  Please call us if the dressing becomes wet or dirty.  4)  If you are experiencing worsening pain or worsening swelling, please call.     MEDICATIONS: Resume all previous home medications at the previous prescribed dose and frequency unless otherwise noted Start taking the  pain medications on an as-needed basis as prescribed  Please taper down pain medication over the next week following surgery.  Ideally you should not require a refill of any narcotic pain medication.  Take pain medication with food to minimize nausea. In addition to the prescribed pain medication, you may take over-the-counter pain relievers such as Tylenol.  Do NOT take additional tylenol if your pain medication already has tylenol in it.  Aspirin 325mg  daily for four weeks.      FOLLOWUP INSTRUCTIONS: 1. Follow up at the Physical Therapy Clinic 3-4 days following surgery. This appointment should be scheduled unless other arrangements have been made.The Physical Therapy scheduling number is [phone] if an appointment has not already been arranged.  2. Contact Dr. [phone] office during office hours at [phone] or the practice after hours line at [phone] for non-emergencies. For medical emergencies call 911.   Discharge Location: Home

## 2022-05-19 NOTE — H&P (Addendum)
Chief Complaint: Bilateral hip pain        History of Present Illness:    04/17/2022: Today for follow-up of her right worse than left hip pain.  She overall got extremely good relief from injections into the abductors of the hips previously.  This is unfortunately completely worn and she is now having pain with any type of walking or basic activity.   03/21/22: Presents today for bilateral hip pain as well as neck pain.  She is now began experiencing headaches approximately 6 times a week.  She does feel very mechanical clicking and popping in the neck.  She believes that this may have a role in inciting her headaches.  With regard to her head she continues to feel better although she does state that she does not want to remain in pain forever.  At this time she is doing well enough that she would forego any type of hip intervention.   Danielle Rocha is a 71 y.o. female presents today with right worse than left hip pain.  This has been going on since 1998.  She has an extremely extensive history of treatment on both of these hips.  She states that the right has been the focus of surgical treatment.  Specifically she did have a right piriformis release done in 1999 in [place] as well as an L4-L5 fusion which was done at [facility] around this time.  She states that she got no relief from either of these procedures.  She continues to experience pain about the lateral aspect of the hips.  She has had multiple injections into the SI and into the hips bilaterally.  These were all done many years prior.  She has been working on physical therapy for multiple years and most recently began working on the hips a year prior.  She is not getting any type of long-term relief.  She is the mother of Danielle Rocha who I have been treating for his knee.       Surgical History:   As above   PMH/PSH/Family History/Social History/Meds/Allergies:         Past Medical History:   Diagnosis Date   Arthritis      DJD, low back, thumb   Atrial fibrillation (HCC)      Xarelto anticoagulation. Flecainide antiarrythmic    Chicken pox     Chronic pain syndrome      On disability. History of bilateral hip pain, low back pain. Gabapentin 400 BID, percocet 1 tablet daily per prior provider.    Colon polyp      awaiting records   Depression      zoloft 100mg , remeron 30mg  per psychiatry. ambien 10mg  per psychiatry to help wtih sleep element.    Dystonia      described as psychogenic dystonia. Pain and twisting from upper chest and up with triggers "wind, creamy food" on TID ativan per psychiatry previously.    Fibromyalgia     GERD (gastroesophageal reflux disease)      omeprazole OTC   Goiter      states multiple imaging tests, has had biopsies   Hyperlipidemia      lovastatin 20mg    Stroke (HCC)      TIA (left side of face and body decreased sensitivity than right face and side)  TIA (transient ischemic attack)     Vaginal atrophy      estrace vaginal cream         Past Surgical History:  Procedure Laterality Date   ATRIAL FIBRILLATION ABLATION       BIOPSY THYROID       fusion l4-l5   09/22/1998   LAMINECTOMY        L4-L5   OTHER SURGICAL HISTORY        tennis elbow surgery   piriformis release   09/22/1997    right hip   TONSILLECTOMY AND ADENOIDECTOMY   age 40   VAGINAL HYSTERECTOMY   09/23/1991    Social History         Socioeconomic History   Marital status: Married      Spouse name: Not on file   Number of children: 3   Years of education: 13   Highest education level: Not on file  Occupational History   Occupation: retired  Tobacco Use   Smoking status: Never   Smokeless tobacco: Never  Vaping Use   Vaping Use: Never used  Substance and Sexual Activity   Alcohol use: No      Alcohol/week: 0.0 standard drinks of alcohol   Drug use: No   Sexual activity: Not Currently      Partners: Male  Other Topics Concern   Not on file   Social History Narrative    Family: Married. Husband works as Geophysicist/field seismologist for [employer]. 2 children from previous marriage 1 from current. Son Danielle Rocha lives with them and has down's syndrome.          Work: Formerly a Patent examiner. Danielle Rocha. Moved to [facility] from [id number] and became disabled in that time frame due to hip/back issues.          Hobbies: time with son    Right handed    One story home    Caffeine occasionally    Social Determinants of Health        Financial Resource Strain: Low Risk  (09/09/2021)    Overall Financial Resource Strain (CARDIA)     Difficulty of Paying Living Expenses: Not hard at all  Food Insecurity: No Food Insecurity (09/09/2021)    Hunger Vital Sign     Worried About Running Out of Food in the Last Year: Never true     Ran Out of Food in the Last Year: Never true  Transportation Needs: No Transportation Needs (09/09/2021)    PRAPARE - Armed forces logistics/support/administrative officer (Medical): No     Lack of Transportation (Non-Medical): No  Physical Activity: Insufficiently Active (09/09/2021)    Exercise Vital Sign     Days of Exercise per Week: 3 days     Minutes of Exercise per Session: 30 min  Stress: No Stress Concern Present (09/09/2021)    Danielle Rocha     Feeling of Stress : Not at all  Social Connections: Unknown (09/09/2021)    Social Connection and Isolation Panel [NHANES]     Frequency of Communication with Friends and Family: More than three times a week     Frequency of Social Gatherings with Friends and Family: More than three times a week     Attends Religious Services: More than 4 times per year     Active Member of [employer] or Organizations: No     Attends Archivist Meetings: Never  Marital Status: Not on file         Family History  Problem Relation Age of Onset   Alcohol abuse Mother          possible she had stomach cancer  as well but unsure   Hypertension Father     Alcohol abuse Father     Pancreatic cancer Father     Cancer Brother          spindle cell RLE   Diabetes Brother     Cancer Brother          tonsil cancer   Skin cancer Brother     Colon cancer Neg Hx     Colon polyps Neg Hx     Stomach cancer Neg Hx           Allergies  Allergen Reactions   Reglan [Metoclopramide] Other (See Comments)      Suicidal thoughts   Ciprofloxacin Diarrhea   Clindamycin/Lincomycin Diarrhea   Doxycycline Diarrhea and Nausea And Vomiting   Macrobid [Nitrofurantoin Monohyd Macro] Rash      Rash on lip   Sulfa Antibiotics Rash          Current Outpatient Medications  Medication Sig Dispense Refill   acetaminophen (TYLENOL) 500 MG tablet Take 1 tablet (500 mg total) by mouth every 8 (eight) hours for 10 days. 30 tablet 0   methocarbamol (ROBAXIN) 500 MG tablet Take 1 tablet (500 mg total) by mouth 4 (four) times daily. 30 tablet 0   oxyCODONE (OXY IR/ROXICODONE) 5 MG immediate release tablet Take 1 tablet (5 mg total) by mouth every 4 (four) hours as needed (severe pain). 20 tablet 0   acetaminophen (TYLENOL) 500 MG tablet Take 1,000 mg by mouth every 6 (six) hours as needed for mild pain, moderate pain, fever or headache.       buPROPion (WELLBUTRIN XL) 150 MG 24 hr tablet Take 150 mg by mouth daily. Per psychiatry       cephALEXin (KEFLEX) 250 MG capsule Take 250 mg by mouth daily. Takes daily for frequent UTIs.       cholecalciferol (VITAMIN D) 1000 units tablet Take 1,000 Units by mouth daily.       co-enzyme Q-10 30 MG capsule Take 30 mg by mouth daily.       Cyanocobalamin (B-12 PO) Take by mouth.       EMGALITY 120 MG/ML SOAJ INJECT  240 MG SUBCUTANEOUSLY ONCE FOR 1 DOSE LOADING DOSE 2 mL 0   FERROUS SULFATE PO Take by mouth.       flecainide (TAMBOCOR) 100 MG tablet Take 1 tablet (100 mg total) by mouth 2 (two) times daily. 180 tablet 3   Galcanezumab-gnlm (EMGALITY) 120 MG/ML SOAJ Inject 120 mg into  the skin every 30 (thirty) days. 1.12 mL 5   Melatonin 3 MG TABS Take 2 tablets by mouth at bedtime.        Multiple Vitamin (MULTIVITAMIN WITH MINERALS) TABS tablet Take 1 tablet by mouth daily.       ondansetron (ZOFRAN ODT) 4 MG disintegrating tablet Take 1 tablet (4 mg total) by mouth every 8 (eight) hours as needed for nausea or vomiting. 20 tablet 0   pantoprazole (PROTONIX) 20 MG tablet Take 1 tablet (20 mg total) by mouth 2 (two) times daily before a meal. Take 2 tablets daily. 180 tablet 3   rivaroxaban (XARELTO) 20 MG TABS tablet Take 1 tablet (20 mg total) by mouth daily with supper. 90  tablet 3   rosuvastatin (CRESTOR) 20 MG tablet Take 1 tablet (20 mg total) by mouth daily. 90 tablet 3   sertraline (ZOLOFT) 50 MG tablet Take 1 tablet (50 mg total) by mouth daily. 30 tablet 6   SUMAtriptan (IMITREX) 100 MG tablet Take 1 tablet (100 mg total) by mouth once as needed for up to 1 dose for migraine. 10 tablet 5   traZODone (DESYREL) 50 MG tablet Take 0.5-1 tablets (25-50 mg total) by mouth at bedtime as needed for sleep. 30 tablet 5    No current facility-administered medications for this visit.    Imaging Results (Last 48 hours)  No results found.     Review of Systems:   A ROS was performed including pertinent positives and negatives as documented in the HPI.   Physical Exam :   Constitutional: NAD and appears stated age Neurological: Alert and oriented Psych: Appropriate affect and cooperative There were no vitals taken for this visit.    Comprehensive Musculoskeletal Exam:     Inspection Right Left  Skin No atrophy or gross abnormalities appreciated No atrophy or gross abnormalities appreciated  Palpation      Tenderness None None  Crepitus None None  Range of Motion      Flexion (passive) 120 120  Extension 30 30  IR 30 no pain 30 no pain  ER 45 with trochanteric pain 45 with trochanteric pain  Strength      Flexion  5/5 5/5  Extension 5/5 5/5  Special Tests       FABIR Negative Negative  FADER Negative Negative  ER Lag/Capsular Insufficiency Negative Negative  Instability Negative Negative  Sacroiliac pain Negative  Negative   Instability      Generalized Laxity No No  Neurologic      sciatic, femoral, obturator nerves intact to light sensation  Vascular/Lymphatic      DP pulse 2+ 2+  Lumbar Exam      Patient has symmetric lumbar range of motion with negative pain referral to hip    Weakness with resisted abduction in the lateral position on both sides.  She is has Trendelenburg gait with ambulation.  This is accentuated with a single standing leg test.   Tenderness to palpation about the midline lower cervical spine.  There is popping with rotation of the neck.  Otherwise negative Spurling and no weakness or dysesthesias in bilateral upper extremities   Imaging:   Xray (AP pelvis, 2 views right hip, 2 views left hip): Very mild osteoarthritis involving both hips   MRI right hip, MRI left hip: There is evidence of a partial-thickness tear of the right as well as the left gluteus medius and minimus.  She has a full-thickness tear of the right gluteus minimus on the right side.   I personally reviewed and interpreted the radiographs.     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  This time she has trialed extensive physical therapy in both injections multiple times.  She did get significant relief from injections of her bilateral glutamine tendons previously but this is subsequently worn off after several months.  She was overall doing quite well with physical therapy but now unfortunately feels like she is back to step 1.  Given the fact that she has trialed so much physical therapy as well as injections  I do believe that she would be a good candidate for a right hip gluteus  medius tendon repair.  I discussed continued treatment options and possible injections versus surgical management.  I did discuss the rehab associated with surgical management.  At this time she has elected for right hip gluteus medius repair Plan :     -Plan for right hip gluteus medius repair with trochanteric bursectomy         After a lengthy discussion of treatment options, including risks, benefits, alternatives, complications of surgical and nonsurgical conservative options, the patient elected surgical repair.   The patient  is aware of the material risks  and complications including, but not limited to injury to adjacent structures, neurovascular injury, infection, numbness, bleeding, implant failure, thermal burns, stiffness, persistent pain, failure to heal, disease transmission from allograft, need for further surgery, dislocation, anesthetic risks, blood clots, risks of death,and others. The probabilities of surgical success and failure discussed with patient given their particular co-morbidities.The time and nature of expected rehabilitation and recovery was discussed.The patient's questions were all answered preoperatively.  No barriers to understanding were noted. I explained the natural history of the disease process and Rx rationale.  I explained to the patient what I considered to be reasonable expectations given their personal situation.  The final treatment plan was arrived at through a shared patient decision making process model.    I personally saw and evaluated the patient, and participated in the management and treatment plan.   Vanetta Mulders, MD Attending Physician, Orthopedic Surgery   This document was dictated using Dragon voice recognition software. A reasonable attempt at proof reading has been made to minimize errors.

## 2022-05-19 NOTE — Brief Op Note (Signed)
   Brief Op Note  Date of Surgery: 05/19/2022  Preoperative Diagnosis: RIGHT GLUTEUS MEDIUS TEAR  Postoperative Diagnosis: same  Procedure: Procedure(s): RIGHT HIP GLUTEUS MEDIUS TENDON REPAIR  Implants: Implant Name Type Inv. Item Serial No. Manufacturer Lot No. LRB No. Used Action  ANCHOR SUT JK SZ 2 2.9 DBL SL - [id number] Anchor ANCHOR SUT JK SZ 2 2.9 DBL SL  [employer](ORTH,TRAU,BIO,SG) [id number] Right 2 Implanted  ANCHOR SUT QUATTRO KNTLS 4.5 - [id number] Anchor ANCHOR SUT QUATTRO KNTLS 4.5  [employer](ORTH,TRAU,BIO,SG) [id number] Right 2 Implanted    Surgeons: Surgeon(s): Huel Cote, MD  Anesthesia: General    Estimated Blood Loss: See anesthesia record  Complications: None  Condition to PACU: Stable  Benancio Deeds, MD 05/19/2022 4:25 PM

## 2022-05-20 ENCOUNTER — Ambulatory Visit (HOSPITAL_BASED_OUTPATIENT_CLINIC_OR_DEPARTMENT_OTHER): Payer: Self-pay | Admitting: Orthopaedic Surgery

## 2022-05-20 ENCOUNTER — Other Ambulatory Visit (HOSPITAL_BASED_OUTPATIENT_CLINIC_OR_DEPARTMENT_OTHER): Payer: Self-pay | Admitting: Orthopaedic Surgery

## 2022-05-20 ENCOUNTER — Encounter (HOSPITAL_COMMUNITY): Payer: Self-pay | Admitting: Orthopaedic Surgery

## 2022-05-20 MED ORDER — METHOCARBAMOL 500 MG PO TABS: 500.0000 mg | ORAL_TABLET | Freq: Four times a day (QID) | ORAL | 0 refills | Status: DC

## 2022-05-21 NOTE — Anesthesia Postprocedure Evaluation (Signed)
Anesthesia Post Note  Patient: Danielle Rocha  Procedure(s) Performed: RIGHT HIP GLUTEUS MEDIUS TENDON REPAIR (Right: Hip)     Patient location during evaluation: PACU Anesthesia Type: General Level of consciousness: awake and sedated Pain management: pain level controlled Vital Signs Assessment: post-procedure vital signs reviewed and stable Respiratory status: spontaneous breathing Cardiovascular status: stable Postop Assessment: no apparent nausea or vomiting Anesthetic complications: no   No notable events documented.  Last Vitals:  Vitals:   05/19/22 1730 05/19/22 1745  BP: 125/62 133/68  Pulse: 69 69  Resp: 16 17  Temp:  36.6 C  SpO2: 95% 96%    Last Pain:  Vitals:   05/19/22 1745  TempSrc:   PainSc: 4    Pain Goal:                   Caren Macadam

## 2022-05-23 ENCOUNTER — Encounter (HOSPITAL_BASED_OUTPATIENT_CLINIC_OR_DEPARTMENT_OTHER): Payer: Self-pay | Admitting: Physical Therapy

## 2022-05-23 ENCOUNTER — Ambulatory Visit (HOSPITAL_BASED_OUTPATIENT_CLINIC_OR_DEPARTMENT_OTHER): Payer: Medicare HMO | Attending: Orthopaedic Surgery | Admitting: Physical Therapy

## 2022-05-23 ENCOUNTER — Encounter (HOSPITAL_BASED_OUTPATIENT_CLINIC_OR_DEPARTMENT_OTHER): Payer: Self-pay | Admitting: Orthopaedic Surgery

## 2022-05-23 ENCOUNTER — Other Ambulatory Visit: Payer: Self-pay

## 2022-05-23 DIAGNOSIS — R262 Difficulty in walking, not elsewhere classified: Secondary | ICD-10-CM | POA: Diagnosis present

## 2022-05-23 DIAGNOSIS — M25651 Stiffness of right hip, not elsewhere classified: Secondary | ICD-10-CM | POA: Insufficient documentation

## 2022-05-23 DIAGNOSIS — M25552 Pain in left hip: Secondary | ICD-10-CM | POA: Insufficient documentation

## 2022-05-23 DIAGNOSIS — S76011A Strain of muscle, fascia and tendon of right hip, initial encounter: Secondary | ICD-10-CM | POA: Insufficient documentation

## 2022-05-23 DIAGNOSIS — M6281 Muscle weakness (generalized): Secondary | ICD-10-CM | POA: Diagnosis present

## 2022-05-23 DIAGNOSIS — M25551 Pain in right hip: Secondary | ICD-10-CM | POA: Diagnosis present

## 2022-05-23 NOTE — Therapy (Signed)
OUTPATIENT PHYSICAL THERAPY LOWER EXTREMITY EVALUATION   Patient Name: Danielle Rocha MRN: [id number] DOB:01-13-1951, 71 y.o., female Today's Date: 05/23/2022   PT End of Session - 05/23/22 1436     Visit Number 1    Number of Visits 28    Date for PT Re-Evaluation 08/15/22    Authorization Type AET[employer] MCR    PT Start Time 0845    PT Stop Time 0930    PT Time Calculation (min) 45 min    Activity Tolerance Patient tolerated treatment well    Behavior During Therapy WFL for tasks assessed/performed             Past Medical History:  Diagnosis Date   Anemia    Arthritis    DJD, low back, thumb   Atrial fibrillation (HCC)    Xarelto anticoagulation. Flecainide antiarrythmic    Chicken pox    Chronic pain syndrome    On disability. History of bilateral hip pain, low back pain. Gabapentin 400 BID, percocet 1 tablet daily per prior provider.    Colon polyp    awaiting records   Depression    zoloft 100mg , remeron 30mg  per psychiatry. ambien 10mg  per psychiatry to help wtih sleep element.    Dysrhythmia    Dystonia    described as psychogenic dystonia. Pain and twisting from upper chest and up with triggers "wind, creamy food" on TID ativan per psychiatry previously.    Fibromyalgia    GERD (gastroesophageal reflux disease)    omeprazole OTC   Goiter    states multiple imaging tests, has had biopsies   Hyperlipidemia    lovastatin 20mg    Stroke (HCC)    TIA (left side of face and body decreased sensitivity than right face and side)   TIA (transient ischemic attack)    Vaginal atrophy    estrace vaginal cream   Past Surgical History:  Procedure Laterality Date   ATRIAL FIBRILLATION ABLATION     BIOPSY THYROID     fusion l4-l5  09/22/1998   GLUTEUS MINIMUS REPAIR Right 05/19/2022   Procedure: RIGHT HIP GLUTEUS MEDIUS TENDON REPAIR;  Surgeon: , MD;  Location: [facility] OR;  Service: Orthopedics;  Laterality: Right;   LAMINECTOMY     L4-L5   OTHER SURGICAL  HISTORY     tennis elbow surgery   piriformis release  09/22/1997   right hip   TONSILLECTOMY AND ADENOIDECTOMY  age 64   VAGINAL HYSTERECTOMY  09/23/1991   Patient Active Problem List   Diagnosis Date Noted   Tear of right gluteus medius tendon    Sore in nose 07/30/2021   Involuntary movements 02/14/2020   Depression with anxiety 02/14/2020   Aortic atherosclerosis (HCC) 02/07/2020   Delayed gastric emptying 02/07/2020   Dystonia    Restless legs 07/27/2018   Recurrent UTI 10/07/2016   Major depression in full remission (HCC) 09/02/2016   GERD (gastroesophageal reflux disease) 01/30/2016   History of SI/intentional drug overdose 11/22/2015   Atrial fibrillation (HCC)    Chronic pain syndrome    Hyperlipidemia     REFERRING PROVIDER: 10/09/2016, MD  REFERRING DIAG: S76.011A (ICD-10-CM) - Tear of right gluteus medius tendon, initial encounter   THERAPY DIAG:  Pain in right hip  Stiffness of right hip, not elsewhere classified  Muscle weakness (generalized)  Difficulty in walking, not elsewhere classified  Rationale for Evaluation and Treatment Rehabilitation  ONSET DATE: DOS 05/19/2022  SUBJECTIVE:   SUBJECTIVE STATEMENT: Patient has had bilateral hip pain  since 1998 with no specific MOI.  Her R hip began hurting first and then her L hip later.  Pt had a right piriformis release done in 1999 and lumbar fusion in 2000 which did not provide relief.  Pt went ot pain management and received injections which only helped a little.  She reports no lasting relief.  Pt was seen in PT from April to June and received aquatic therapy.  She thought it helped some but did have increased pain during some appt's.     Pt underwent Right hip gluteus medius/minimus repair and trochanteric bursectomy on 05/19/2022.  Post op plan on op note indicated: 50% weight bearing on the right leg for 2 weeks then full weight bearing.   Pt is limited with her normal functional mobility skills  including ambulation, transfers, and stairs.  Pt has difficulty with lifting R LE to perform transfers.  Pt is limited with standing duration and unable to perform household chores.  Pt is not driving.    PERTINENT HISTORY:  -R hip gluteus medius/minimus repair and trochanteric bursectomy on 05/19/2022.  50% weight bearing on the right leg for 2 weeks then full weight bearing.  No active abduction, IR or passive ER, adduction at least 6 weeks  -L4-5 Fusion in 2000, chronic pain syndrome, Atrial fibrillation, fibromyalgia, Depression, TIA  -R piriformis release in 1999 hip     PAIN:  Are you having pain? Yes NPRS:  Current:  6/10, Worst:  8-9/10, Best:  2/10 Location:  R lateral hip and lateral thigh  PRECAUTIONS: Other: per surgical protocol.  No active abduction, IR or passive ER, adduction at least 6 weeks.  Wb'ing restrictions.  L4-5 Fusion, A-fib  WEIGHT BEARING RESTRICTIONS Yes 50% WB'ing  FALLS:  Has patient fallen in last 6 months? Yes. Number of falls 1-2 due to medications  LIVING ENVIRONMENT: Lives with: lives with their spouse and lives with their son Lives in: one story home with an attic Stairs: 5 steps with rail to enter front, 4 steps with rail to enter side, and 4 steps, with rails to enter back  Has following equipment at home: Dan Humphreys - 2 wheeled and rollator and quad cane  OCCUPATION: pt is retired  PLOF: Independent ; Pt ambulated without an AD.  Pt had increased pain with household chores.  She was limited with ambulation and shopping.   PATIENT GOALS pt wants to be able to walk without pain, gardening, and be out with her family.     OBJECTIVE:   DIAGNOSTIC FINDINGS: pt has had prior x rays and MRIs  PATIENT SURVEYS:  FOTO 33 with a goal of  52 at visit #18  COGNITION:  Overall cognitive status: Within functional limits for tasks assessed       OBSERVATION: Pt has an aquacell bandage over incision with small spots of drainage.  She has no excessive  drainage and no signs of infection.  Pt has bruising peri incision and superior to incision.    PALPATION: TTP:  lateral R hip and minimally in   LOWER EXTREMITY ROM:  Active ROM Right eval Left eval  Hip flexion    Hip extension    Hip abduction  18  Hip adduction    Hip internal rotation    Hip external rotation  22  Knee flexion  137  Knee extension    Ankle dorsiflexion    Ankle plantarflexion    Ankle inversion    Ankle eversion     (Blank  rows = not tested)  LOWER EXTREMITY MMT:   Strength not tested due to surgical protocol and healing constraints   GAIT: Comments: Pt ambulated with rollator with appropriate 50% Wb'ing restrictions. She reports no increased pain with ambulation in the clinic.      TODAY'S TREATMENT: Educated pt in Wb'ing restrictions of 50% and instructed pt in gait with rollator with 50% Wb'ing.    Pt performed ankle pumps x 20 reps.  PT gave pt a HEP handout and educated pt in correct form and appropriate frequency.  Educated pt in correct positioning for HEP.  PATIENT EDUCATION:  Education details: Post op and protocol restrictions and expectations, ROM restrictions, dx, HEP, Wb'ing restrictions, gait, and POC.  Instructed pt in using ice. Person educated: Patient Education method: Explanation, Demonstration, Verbal cues, and Handouts Education comprehension: verbalized understanding, returned demonstration, verbal cues required, and needs further education   HOME EXERCISE PROGRAM: Access Code: [email or web address] URL: [email or web address] Date: 05/23/2022 Prepared by: Aaron Edelman  Exercises - ANKLE PUMPS  - 3 x daily - 7 x weekly - 3 sets - 10 reps  ASSESSMENT:  CLINICAL IMPRESSION: Patient is a 71 y.o. female 4 days s/p R hip gluteus medius/minimus repair and trochanteric bursectomy presenting to the clinic with expected post op findings of R hip pain, limited R hip ROM, muscle weakness in R LE, and difficulty in walking.  Pt  has a hx of L hip pain also.  Pt is limited with her normal functional mobility skills including ambulation, transfers, and stairs.  Pt is ambulating with a rollator and has 50% WB'ing restrictions.  She has difficulty with lifting R LE to perform transfers.  Pt is limited with standing duration and unable to perform household chores.  Pt should benefit from skilled PT services to address impairments and to improve overall function.     OBJECTIVE IMPAIRMENTS Abnormal gait, decreased activity tolerance, decreased balance, decreased endurance, decreased mobility, difficulty walking, decreased ROM, decreased strength, hypomobility, impaired flexibility, and pain.   ACTIVITY LIMITATIONS lifting, bending, standing, squatting, stairs, transfers, bed mobility, dressing, and locomotion level  PARTICIPATION LIMITATIONS: cleaning, laundry, driving, shopping, community activity, and gardening  PERSONAL FACTORS Time since onset of injury/illness/exacerbation and 3+ comorbidities: L4-5 Fusion in 2000, chronic pain syndrome, Atrial fibrillation, fibromyalgia, Depression   are also affecting patient's functional outcome.   REHAB POTENTIAL: Good  CLINICAL DECISION MAKING: Stable/uncomplicated  EVALUATION COMPLEXITY: Low   GOALS:  SHORT TERM GOALS: Target date: 06/20/2022  Pt will be independent and compliant with HEP for improved pain, strength, and function Baseline: Goal status: INITIAL  2.  Pt will progress with exercises per protocol without adverse effects to improve strength and mobility.   Baseline:  Goal status: INITIAL Target Date:  07/04/2022   3.  Pt will progress with PROM per protocol without adverse effects for improved stiffness and mobility.  Baseline:  Goal status: INITIAL Target date:  07/18/2022  4.  Pt will progress Wb'ing and appropriately wean AD per MD orders or protocol. Baseline:  Goal status: INITIAL Target date:  07/04/2022  5.  Pt will be able to perform a 6 inch  step up with good form and control with L LE leading for improved performance of stairs abd and improved functional strength. Baseline:  Goal status: INITIAL Target date:  08/15/2022   6.  Pt will report improved tolerance with standing and performing household chores.  Baseline:  Goal status: INITIAL Target date:  08/15/2022  LONG TERM GOALS: Target date:  09/11/2022   Pt will ambulate with a normalized heel to toe gait without limping Baseline:  Goal status: INITIAL  2.  Pt will ambulate extended community distance without an AD without significant pain and difficulty.  Baseline:  Goal status: INITIAL  3.  Pt will be able to perform her normal standing activities and household chores without significant pain.  Baseline:  Goal status: INITIAL  4.    Pt will demo at least 4/5 hip flexion, 4-/5 hip abd, and 5/5 knee extension strength for improved performance of and tolerance with functional mobility.  Baseline:  Goal status: INITIAL  5.  Pt will be able to perform stairs with a reciprocal gait with a rail with good control.  Baseline:  Goal status: INITIAL     PLAN: PT FREQUENCY:  1x/wk x 4 week and 2x/wk afterwards  PT DURATION: other: 16 weeks  PLANNED INTERVENTIONS: Therapeutic exercises, Therapeutic activity, Neuromuscular re-education, Balance training, Gait training, Patient/Family education, Self Care, Joint mobilization, Stair training, DME instructions, Aquatic Therapy, Dry Needling, Cryotherapy, Moist heat, scar mobilization, Taping, Ultrasound, Manual therapy, and Re-evaluation  PLAN FOR NEXT SESSION: Cont per Dr. Serena Croissant gluteus medius repair protocol.  Post op plan on op note indicated: 50% weight bearing on the right leg for 2 weeks then full weight bearing.  No active abduction, IR or passive ER, adduction at least 6 weeks   Audie Clear III PT, DPT 05/23/22 3:55 PM

## 2022-05-27 NOTE — Therapy (Signed)
OUTPATIENT PHYSICAL THERAPY LOWER EXTREMITY EVALUATION   Patient Name: Danielle Rocha MRN: [id number] DOB:[birth year], 71 y.o., female Today's Date: 05/28/2022   PT End of Session - 05/28/22 0856     Visit Number 2    Number of Visits 28    Date for PT Re-Evaluation 08/15/22    Authorization Type AET[employer] MCR    PT Start Time 0850    PT Stop Time 0930    PT Time Calculation (min) 40 min    Activity Tolerance Patient tolerated treatment well    Behavior During Therapy WFL for tasks assessed/performed              Past Medical History:  Diagnosis Date   Anemia    Arthritis    DJD, low back, thumb   Atrial fibrillation (HCC)    Xarelto anticoagulation. Flecainide antiarrythmic    Chicken pox    Chronic pain syndrome    On disability. History of bilateral hip pain, low back pain. Gabapentin 400 BID, percocet 1 tablet daily per prior provider.    Colon polyp    awaiting records   Depression    zoloft 100mg , remeron 30mg  per psychiatry. ambien 10mg  per psychiatry to help wtih sleep element.    Dysrhythmia    Dystonia    described as psychogenic dystonia. Pain and twisting from upper chest and up with triggers "wind, creamy food" on TID ativan per psychiatry previously.    Fibromyalgia    GERD (gastroesophageal reflux disease)    omeprazole OTC   Goiter    states multiple imaging tests, has had biopsies   Hyperlipidemia    lovastatin 20mg    Stroke (HCC)    TIA (left side of face and body decreased sensitivity than right face and side)   TIA (transient ischemic attack)    Vaginal atrophy    estrace vaginal cream   Past Surgical History:  Procedure Laterality Date   ATRIAL FIBRILLATION ABLATION     BIOPSY THYROID     fusion l4-l5  09/22/1998   GLUTEUS MINIMUS REPAIR Right 05/19/2022   Procedure: RIGHT HIP GLUTEUS MEDIUS TENDON REPAIR;  Surgeon: , MD;  Location: [facility] OR;  Service: Orthopedics;  Laterality: Right;   LAMINECTOMY     L4-L5   OTHER SURGICAL  HISTORY     tennis elbow surgery   piriformis release  09/22/1997   right hip   TONSILLECTOMY AND ADENOIDECTOMY  age 23   VAGINAL HYSTERECTOMY  09/23/1991   Patient Active Problem List   Diagnosis Date Noted   Tear of right gluteus medius tendon    Sore in nose 07/30/2021   Involuntary movements 02/14/2020   Depression with anxiety 02/14/2020   Aortic atherosclerosis (HCC) 02/07/2020   Delayed gastric emptying 02/07/2020   Dystonia    Restless legs 07/27/2018   Recurrent UTI 10/07/2016   Major depression in full remission (HCC) 09/02/2016   GERD (gastroesophageal reflux disease) 01/30/2016   History of SI/intentional drug overdose 11/22/2015   Atrial fibrillation (HCC)    Chronic pain syndrome    Hyperlipidemia     REFERRING PROVIDER: 10/09/2016, MD  REFERRING DIAG: S76.011A (ICD-10-CM) - Tear of right gluteus medius tendon, initial encounter   THERAPY DIAG:  Pain in right hip  Stiffness of right hip, not elsewhere classified  Muscle weakness (generalized)  Difficulty in walking, not elsewhere classified  Rationale for Evaluation and Treatment Rehabilitation  ONSET DATE: DOS 05/19/2022  SUBJECTIVE:   SUBJECTIVE STATEMENT: Pt is 1 week and  2 days s/p Right hip gluteus medius/minimus repair and trochanteric bursectomy.  Post op plan on op note indicated: 50% weight bearing on the right leg for 2 weeks then full weight bearing.  Pt is limited with her normal functional mobility skills including ambulation, transfers, and stairs.  Pt has difficulty with lifting R LE to perform transfers.  Pt is limited with standing duration and unable to perform household chores.  Pt is not driving.    Pt states she is getting better.  Pt denies any adverse effects after prior Rx.  Pt reports improved performance of steps at her home.  Pt states she has pain in posterior thigh with sitting.  Pt is compliant with Wb'ing status.  Pt is compliant with HEP.  Pt states she has reached  forward some when she is sitting but not much.  Pt states she has moved to the leg out to the side which caused pain.  PT instructed pt she should not move leg outward.  Pt has difficulty sleeping.    PERTINENT HISTORY:  -R hip gluteus medius/minimus repair and trochanteric bursectomy on 05/19/2022.  50% weight bearing on the right leg for 2 weeks then full weight bearing.  No active abduction, IR or passive ER, adduction at least 6 weeks  -L4-5 Fusion in 2000, chronic pain syndrome, Atrial fibrillation, fibromyalgia, Depression, TIA  -R piriformis release in 1999 hip     PAIN:  Are you having pain? Yes NPRS:  Current:  6/10, Worst:  8-9/10, Best:  2/10 Location:  R lateral hip and lateral thigh  PRECAUTIONS: Other: per surgical protocol.  No active abduction, IR or passive ER, adduction at least 6 weeks.  Wb'ing restrictions.  L4-5 Fusion, A-fib  WEIGHT BEARING RESTRICTIONS Yes 50% WB'ing  FALLS:  Has patient fallen in last 6 months? Yes. Number of falls 1-2 due to medications  LIVING ENVIRONMENT: Lives with: lives with their spouse and lives with their son Lives in: one story home with an attic Stairs: 5 steps with rail to enter front, 4 steps with rail to enter side, and 4 steps, with rails to enter back  Has following equipment at home: Dan Humphreys - 2 wheeled and rollator and quad cane  OCCUPATION: pt is retired  PLOF: Independent ; Pt ambulated without an AD.  Pt had increased pain with household chores.  She was limited with ambulation and shopping.   PATIENT GOALS pt wants to be able to walk without pain, gardening, and be out with her family.     OBJECTIVE:   DIAGNOSTIC FINDINGS: pt has had prior x rays and MRIs    TODAY'S TREATMENT:  LOWER EXTREMITY ROM:  ROM Right 9/6 PROM Left Eval AROM  Hip flexion 62   Hip extension    Hip abduction 14 18  Hip adduction    Hip internal rotation    Hip external rotation  22  Knee flexion  137  Knee extension    Ankle  dorsiflexion    Ankle plantarflexion    Ankle inversion    Ankle eversion     (Blank rows = not tested)    GAIT: Comments: Pt ambulated with rollator with appropriate 50% Wb'ing restrictions.     Therapeutic Exercise: -Reviewed current function, pain level, HEP compliance, and response to prior Rx. -Reviewed HEP. -Pt received hip flexion PROM and abd PROM per pt and tissue tolerance w/n protocol ranges.   Assessed hip PROM -Pt performed  Ankle pumps x 20 reps  Quad  sets with 5 sec hold approx 15 reps  Supine Tra contraction approx 12 reps with 5 sec hold  Posterior pelvic tilt x 10  SAQ 2x10  -See below for pt education. -PT updated HEP and gave pt a HEP handout.  Educated pt in correct form and appropriate frequency and also in correct positioning for HEP.  PATIENT EDUCATION:  Education details:  PT answered Pt's questions including on proper positioning for sleeping.  PT instructed pt in protocol restrictions and ROM limitations.  PT instructed pt to not perform hip abduction and IR.  Objective findings, dx, HEP, Wb'ing restrictions, gait, HEP, and POC.  Instructed pt in using ice. Person educated: Patient Education method: Explanation, Demonstration, Verbal cues, and Handouts Education comprehension: verbalized understanding, returned demonstration, verbal cues required, and needs further education   HOME EXERCISE PROGRAM: Access Code: [email or web address] URL: [email or web address] Date: 05/23/2022 Prepared by: Aaron Edelman  Exercises - ANKLE PUMPS  - 3 x daily - 7 x weekly - 3 sets - 10 reps Updated HEP: - Supine Quadricep Sets  - 2 x daily - 7 x weekly - 2 sets - 10 reps - 5 seconds hold - Supine Transversus Abdominis Bracing - Hands on Stomach  - 2 x daily - 7 x weekly - 2 sets - 10 reps - 5 seconds hold  ASSESSMENT:  CLINICAL IMPRESSION: Pt reports compliance with HEP and Wb'ing status.  PT performed PROM w/n protocol ranges and pt tolerated PROM well.  PT  reviewed and updated HEP today.  PT instructed pt in correct positioning of exercises and pt performed exercises well with cuing and instruction.  She reports increased pain to 7-8/10 after Rx.  PT instructed pt to use ice when returning home.   Pt should benefit from skilled PT services to address impairments and goals and to improve overall function.    OBJECTIVE IMPAIRMENTS Abnormal gait, decreased activity tolerance, decreased balance, decreased endurance, decreased mobility, difficulty walking, decreased ROM, decreased strength, hypomobility, impaired flexibility, and pain.   ACTIVITY LIMITATIONS lifting, bending, standing, squatting, stairs, transfers, bed mobility, dressing, and locomotion level  PARTICIPATION LIMITATIONS: cleaning, laundry, driving, shopping, community activity, and gardening  PERSONAL FACTORS Time since onset of injury/illness/exacerbation and 3+ comorbidities: L4-5 Fusion in 2000, chronic pain syndrome, Atrial fibrillation, fibromyalgia, Depression   are also affecting patient's functional outcome.   REHAB POTENTIAL: Good  CLINICAL DECISION MAKING: Stable/uncomplicated  EVALUATION COMPLEXITY: Low   GOALS:  SHORT TERM GOALS: Target date: 06/20/2022  Pt will be independent and compliant with HEP for improved pain, strength, and function Baseline: Goal status: INITIAL  2.  Pt will progress with exercises per protocol without adverse effects to improve strength and mobility.   Baseline:  Goal status: INITIAL Target Date:  07/04/2022   3.  Pt will progress with PROM per protocol without adverse effects for improved stiffness and mobility.  Baseline:  Goal status: INITIAL Target date:  07/18/2022  4.  Pt will progress Wb'ing and appropriately wean AD per MD orders or protocol. Baseline:  Goal status: INITIAL Target date:  07/04/2022  5.  Pt will be able to perform a 6 inch step up with good form and control with L LE leading for improved performance of  stairs abd and improved functional strength. Baseline:  Goal status: INITIAL Target date:  08/15/2022   6.  Pt will report improved tolerance with standing and performing household chores.  Baseline:  Goal status: INITIAL Target date:  08/15/2022  LONG TERM GOALS: Target date:  09/11/2022   Pt will ambulate with a normalized heel to toe gait without limping Baseline:  Goal status: INITIAL  2.  Pt will ambulate extended community distance without an AD without significant pain and difficulty.  Baseline:  Goal status: INITIAL  3.  Pt will be able to perform her normal standing activities and household chores without significant pain.  Baseline:  Goal status: INITIAL  4.    Pt will demo at least 4/5 hip flexion, 4-/5 hip abd, and 5/5 knee extension strength for improved performance of and tolerance with functional mobility.  Baseline:  Goal status: INITIAL  5.  Pt will be able to perform stairs with a reciprocal gait with a rail with good control.  Baseline:  Goal status: INITIAL     PLAN: PT FREQUENCY:  1x/wk x 4 week and 2x/wk afterwards  PT DURATION: other: 16 weeks  PLANNED INTERVENTIONS: Therapeutic exercises, Therapeutic activity, Neuromuscular re-education, Balance training, Gait training, Patient/Family education, Self Care, Joint mobilization, Stair training, DME instructions, Aquatic Therapy, Dry Needling, Cryotherapy, Moist heat, scar mobilization, Taping, Ultrasound, Manual therapy, and Re-evaluation  PLAN FOR NEXT SESSION: Cont per Dr. Serena Croissant gluteus medius repair protocol.  Post op plan on op note indicated: 50% weight bearing on the right leg for 2 weeks then full weight bearing.  No active abduction, IR or passive ER, adduction at least 6 weeks   Audie Clear III PT, DPT 05/28/22 11:36 AM

## 2022-05-28 ENCOUNTER — Encounter (HOSPITAL_BASED_OUTPATIENT_CLINIC_OR_DEPARTMENT_OTHER): Payer: Self-pay | Admitting: Physical Therapy

## 2022-05-28 ENCOUNTER — Ambulatory Visit (HOSPITAL_BASED_OUTPATIENT_CLINIC_OR_DEPARTMENT_OTHER): Payer: Medicare HMO | Admitting: Physical Therapy

## 2022-05-28 DIAGNOSIS — M6281 Muscle weakness (generalized): Secondary | ICD-10-CM

## 2022-05-28 DIAGNOSIS — M25651 Stiffness of right hip, not elsewhere classified: Secondary | ICD-10-CM | POA: Diagnosis not present

## 2022-05-28 DIAGNOSIS — R262 Difficulty in walking, not elsewhere classified: Secondary | ICD-10-CM

## 2022-05-28 DIAGNOSIS — S76011A Strain of muscle, fascia and tendon of right hip, initial encounter: Secondary | ICD-10-CM | POA: Diagnosis not present

## 2022-05-28 DIAGNOSIS — M25551 Pain in right hip: Secondary | ICD-10-CM | POA: Diagnosis not present

## 2022-05-28 DIAGNOSIS — M25552 Pain in left hip: Secondary | ICD-10-CM | POA: Diagnosis not present

## 2022-05-30 ENCOUNTER — Ambulatory Visit (INDEPENDENT_AMBULATORY_CARE_PROVIDER_SITE_OTHER): Payer: Medicare HMO | Admitting: Orthopaedic Surgery

## 2022-05-30 ENCOUNTER — Other Ambulatory Visit (HOSPITAL_BASED_OUTPATIENT_CLINIC_OR_DEPARTMENT_OTHER): Payer: Self-pay

## 2022-05-30 DIAGNOSIS — S76011A Strain of muscle, fascia and tendon of right hip, initial encounter: Secondary | ICD-10-CM

## 2022-05-30 MED ORDER — OXYCODONE HCL 5 MG PO TABS
5.0000 mg | ORAL_TABLET | ORAL | 0 refills | Status: DC | PRN
  Filled 2022-05-30: qty 15, 3d supply, fill #0

## 2022-05-30 MED ORDER — OXYCODONE HCL 5 MG PO CAPS: 5.0000 mg | ORAL_CAPSULE | ORAL | 0 refills | Status: DC | PRN

## 2022-05-30 MED ORDER — CYCLOBENZAPRINE HCL 10 MG PO TABS: 10.0000 mg | ORAL_TABLET | Freq: Three times a day (TID) | ORAL | 0 refills | Status: DC | PRN

## 2022-05-30 MED ORDER — CYCLOBENZAPRINE HCL 10 MG PO TABS
10.0000 mg | ORAL_TABLET | Freq: Three times a day (TID) | ORAL | 0 refills | Status: DC | PRN
  Filled 2022-05-30: qty 30, 10d supply, fill #0

## 2022-05-30 NOTE — Addendum Note (Signed)
Addended by: Benancio Deeds on: 05/30/2022 01:14 PM   Modules accepted: Orders

## 2022-05-30 NOTE — Progress Notes (Signed)
Post Operative Evaluation    Procedure/Date of Surgery: right hip gluteus medius repair 05/19/22  Interval History:    Presents today 2 weeks status post the above procedure overall she is doing very well.  She is still having some soreness and tightness in the thigh although her lateral trochanteric type pain is completely resolved.  She is having some bruising around the buttocks and thigh.  She has been compliant with anticoagulation usage.  She is using a walker to assist with ambulatory status.  PMH/PSH/Family History/Social History/Meds/Allergies:    Past Medical History:  Diagnosis Date   Anemia    Arthritis    DJD, low back, thumb   Atrial fibrillation (HCC)    Xarelto anticoagulation. Flecainide antiarrythmic    Chicken pox    Chronic pain syndrome    On disability. History of bilateral hip pain, low back pain. Gabapentin 400 BID, percocet 1 tablet daily per prior provider.    Colon polyp    awaiting records   Depression    zoloft 100mg , remeron 30mg  per psychiatry. ambien 10mg  per psychiatry to help wtih sleep element.    Dysrhythmia    Dystonia    described as psychogenic dystonia. Pain and twisting from upper chest and up with triggers "wind, creamy food" on TID ativan per psychiatry previously.    Fibromyalgia    GERD (gastroesophageal reflux disease)    omeprazole OTC   Goiter    states multiple imaging tests, has had biopsies   Hyperlipidemia    lovastatin 20mg    Stroke (HCC)    TIA (left side of face and body decreased sensitivity than right face and side)   TIA (transient ischemic attack)    Vaginal atrophy    estrace vaginal cream   Past Surgical History:  Procedure Laterality Date   ATRIAL FIBRILLATION ABLATION     BIOPSY THYROID     fusion l4-l5  09/22/1998   GLUTEUS MINIMUS REPAIR Right 05/19/2022   Procedure: RIGHT HIP GLUTEUS MEDIUS TENDON REPAIR;  Surgeon: , MD;  Location: [facility] OR;  Service:  Orthopedics;  Laterality: Right;   LAMINECTOMY     L4-L5   OTHER SURGICAL HISTORY     tennis elbow surgery   piriformis release  09/22/1997   right hip   TONSILLECTOMY AND ADENOIDECTOMY  age 71   VAGINAL HYSTERECTOMY  09/23/1991   Social History   Socioeconomic History   Marital status: Married    Spouse name: Not on file   Number of children: 3   Years of education: 13   Highest education level: Not on file  Occupational History   Occupation: retired  Tobacco Use   Smoking status: Never   Smokeless tobacco: Never  Vaping Use   Vaping Use: Never used  Substance and Sexual Activity   Alcohol use: No    Alcohol/week: 0.0 standard drinks of alcohol   Drug use: No   Sexual activity: Not Currently    Partners: Male  Other Topics Concern   Not on file  Social History Narrative   Family: Married. Husband works as [employer] for 11/20/1997 center. 2 children from previous marriage 1 from current. Son 9 lives with them and has down's syndrome.       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 Moved to [place] from  [phone] and became disabled in that time frame due to hip/back issues.       Hobbies: time with son   Right handed   One story home   Caffeine occasionally   Social Determinants of Health   Financial Resource Strain: Low Risk  (09/09/2021)   Overall Financial Resource Strain (CARDIA)    Difficulty of Paying Living Expenses: Not hard at all  Food Insecurity: No Food Insecurity (09/09/2021)   Hunger Vital Sign    Worried About Running Out of Food in the Last Year: Never true    Ran Out of Food in the Last Year: Never true  Transportation Needs: No Transportation Needs (09/09/2021)   PRAPARE - Administrator, Civil Service (Medical): No    Lack of Transportation (Non-Medical): No  Physical Activity: Insufficiently Active (09/09/2021)   Exercise Vital Sign    Days of Exercise per Week: 3 days    Minutes of Exercise per Session:  30 min  Stress: No Stress Concern Present (09/09/2021)   [employer] of Occupational Health - Occupational Stress Questionnaire    Feeling of Stress : Not at all  Social Connections: Unknown (09/09/2021)   Social Connection and Isolation Panel [NHANES]    Frequency of Communication with Friends and Family: More than three times a week    Frequency of Social Gatherings with Friends and Family: More than three times a week    Attends Religious Services: More than 4 times per year    Active Member of [employer] or Organizations: No    Attends Engineer, structural: Never    Marital Status: Not on file   Family History  Problem Relation Age of Onset   Alcohol abuse Mother        possible she had stomach cancer as well but unsure   Hypertension Father    Alcohol abuse Father    Pancreatic cancer Father    Cancer Brother        spindle cell RLE   Diabetes Brother    Cancer Brother        tonsil cancer   Skin cancer Brother    Colon cancer Neg Hx    Colon polyps Neg Hx    Stomach cancer Neg Hx    Allergies  Allergen Reactions   Reglan [Metoclopramide] Other (See Comments)    Suicidal thoughts   Ciprofloxacin Diarrhea   Clindamycin/Lincomycin Diarrhea   Doxycycline Diarrhea and Nausea And Vomiting   Macrobid [Nitrofurantoin Monohyd Macro] Rash    Rash on lip   Sulfa Antibiotics Rash   Current Outpatient Medications  Medication Sig Dispense Refill   cyclobenzaprine (FLEXERIL) 10 MG tablet Take 1 tablet (10 mg total) by mouth 3 (three) times daily as needed for muscle spasms. 30 tablet 0   methocarbamol (ROBAXIN) 500 MG tablet Take 1 tablet (500 mg total) by mouth 4 (four) times daily. (Patient not taking: Reported on 05/14/2022) 30 tablet 0   methocarbamol (ROBAXIN) 500 MG tablet Take 1 tablet (500 mg total) by mouth 4 (four) times daily. 30 tablet 0   oxycodone (OXY-IR) 5 MG capsule Take 1 capsule (5 mg total) by mouth every 4 (four) hours as needed (severe pain). 15  capsule 0   acetaminophen (TYLENOL) 500 MG tablet Take 1,000 mg by mouth every 6 (six) hours as needed for mild pain, moderate pain, fever or headache.     buPROPion (WELLBUTRIN XL) 150 MG 24 hr tablet Take 300 mg by mouth daily.  cephALEXin (KEFLEX) 250 MG capsule Take 250 mg by mouth daily.     Cholecalciferol (VITAMIN D) 125 MCG (5000 UT) CAPS Take 5,000 Units by mouth daily.     Coenzyme Q10 200 MG TABS Take 200 mg by mouth daily.     Cyanocobalamin (B-12 PO) Take 1 tablet by mouth daily.     EMGALITY 120 MG/ML SOAJ INJECT  240 MG SUBCUTANEOUSLY ONCE FOR 1 DOSE LOADING DOSE (Patient not taking: Reported on 05/14/2022) 2 mL 0   flecainide (TAMBOCOR) 100 MG tablet Take 1 tablet (100 mg total) by mouth 2 (two) times daily. 180 tablet 3   Galcanezumab-gnlm (EMGALITY) 120 MG/ML SOAJ Inject 120 mg into the skin every 30 (thirty) days. 1.12 mL 5   Magnesium Oxide 420 MG TABS Take 420 mg by mouth daily.     melatonin 5 MG TABS Take 5 mg by mouth at bedtime.     Multiple Vitamin (MULTIVITAMIN WITH MINERALS) TABS tablet Take 1 tablet by mouth daily.     ondansetron (ZOFRAN ODT) 4 MG disintegrating tablet Take 1 tablet (4 mg total) by mouth every 8 (eight) hours as needed for nausea or vomiting. 20 tablet 0   oxyCODONE (OXY IR/ROXICODONE) 5 MG immediate release tablet Take 1 tablet (5 mg total) by mouth every 4 (four) hours as needed (severe pain). (Patient not taking: Reported on 05/14/2022) 20 tablet 0   pantoprazole (PROTONIX) 20 MG tablet TAKE 1 TABLET BY MOUTH TWICE DAILY BEFORE A MEAL. 180 tablet 0   pyridOXINE (VITAMIN B6) 100 MG tablet Take 100 mg by mouth daily.     rivaroxaban (XARELTO) 20 MG TABS tablet Take 1 tablet (20 mg total) by mouth daily with supper. 90 tablet 3   rosuvastatin (CRESTOR) 20 MG tablet Take 1 tablet by mouth once daily (Patient taking differently: Take 20 mg by mouth at bedtime.) 90 tablet 0   sertraline (ZOLOFT) 50 MG tablet Take 1 tablet (50 mg total) by mouth daily.  (Patient taking differently: Take 50 mg by mouth in the morning and at bedtime.) 30 tablet 6   SUMAtriptan (IMITREX) 100 MG tablet Take 1 tablet (100 mg total) by mouth once as needed for up to 1 dose for migraine. 10 tablet 5   traZODone (DESYREL) 50 MG tablet Take 0.5-1 tablets (25-50 mg total) by mouth at bedtime as needed for sleep. 30 tablet 5   No current facility-administered medications for this visit.   No results found.  Review of Systems:   A ROS was performed including pertinent positives and negatives as documented in the HPI.   Musculoskeletal Exam:    There were no vitals taken for this visit.  Right hip is well-appearing.  No erythema or drainage.  Internal rotation of the right hip is 10 degrees internal and external passively.  She is walking with a walker.  Imaging:    none  I personally reviewed and interpreted the radiographs.   Assessment:   71 year old female who is 2 weeks status post right gluteus medius tendon repair overall doing very well.  At this time she will continue to progress her weightbearing.  She will continue to progress according to the gluteus medius repair protocol.  I will plan to see her back in 4 weeks for reassessment.  All questions and activity restrictions were discussed.  Plan :    -Return to clinic in 4 weeks      I personally saw and evaluated the patient, and participated in the management  and treatment plan.  Vanetta Mulders, MD Attending Physician, Orthopedic Surgery  This document was dictated using Dragon voice recognition software. A reasonable attempt at proof reading has been made to minimize errors.

## 2022-05-31 ENCOUNTER — Other Ambulatory Visit: Payer: Self-pay | Admitting: Family Medicine

## 2022-06-02 ENCOUNTER — Ambulatory Visit (HOSPITAL_BASED_OUTPATIENT_CLINIC_OR_DEPARTMENT_OTHER): Payer: Medicare HMO | Admitting: Physical Therapy

## 2022-06-02 DIAGNOSIS — M25651 Stiffness of right hip, not elsewhere classified: Secondary | ICD-10-CM | POA: Diagnosis not present

## 2022-06-02 DIAGNOSIS — R262 Difficulty in walking, not elsewhere classified: Secondary | ICD-10-CM

## 2022-06-02 DIAGNOSIS — M25552 Pain in left hip: Secondary | ICD-10-CM | POA: Diagnosis not present

## 2022-06-02 DIAGNOSIS — S76011A Strain of muscle, fascia and tendon of right hip, initial encounter: Secondary | ICD-10-CM | POA: Diagnosis not present

## 2022-06-02 DIAGNOSIS — M25551 Pain in right hip: Secondary | ICD-10-CM

## 2022-06-02 DIAGNOSIS — M6281 Muscle weakness (generalized): Secondary | ICD-10-CM

## 2022-06-02 NOTE — Therapy (Signed)
OUTPATIENT PHYSICAL THERAPY LOWER EXTREMITY Treatment    Patient Name: Danielle Rocha MRN: [id number] DOB:[birth year], 71 y.o., female Today's Date: 06/02/2022   PT End of Session - 06/02/22 1141     Visit Number 3    Number of Visits 28    Date for PT Re-Evaluation 08/15/22    Authorization Type AETNA MCR    PT Start Time 0930    PT Stop Time 1013    PT Time Calculation (min) 43 min    Activity Tolerance Patient tolerated treatment well    Behavior During Therapy WFL for tasks assessed/performed               Past Medical History:  Diagnosis Date   Anemia    Arthritis    DJD, low back, thumb   Atrial fibrillation (HCC)    Xarelto anticoagulation. Flecainide antiarrythmic    Chicken pox    Chronic pain syndrome    On disability. History of bilateral hip pain, low back pain. Gabapentin 400 BID, percocet 1 tablet daily per prior provider.    Colon polyp    awaiting records   Depression    zoloft 100mg , remeron 30mg  per psychiatry. ambien 10mg  per psychiatry to help wtih sleep element.    Dysrhythmia    Dystonia    described as psychogenic dystonia. Pain and twisting from upper chest and up with triggers "wind, creamy food" on TID ativan per psychiatry previously.    Fibromyalgia    GERD (gastroesophageal reflux disease)    omeprazole OTC   Goiter    states multiple imaging tests, has had biopsies   Hyperlipidemia    lovastatin 20mg    Stroke (HCC)    TIA (left side of face and body decreased sensitivity than right face and side)   TIA (transient ischemic attack)    Vaginal atrophy    estrace vaginal cream   Past Surgical History:  Procedure Laterality Date   ATRIAL FIBRILLATION ABLATION     BIOPSY THYROID     fusion l4-l5  09/22/1998   GLUTEUS MINIMUS REPAIR Right 05/19/2022   Procedure: RIGHT HIP GLUTEUS MEDIUS TENDON REPAIR;  Surgeon: Vanetta Mulders, MD;  Location: [facility];  Service: Orthopedics;  Laterality: Right;   LAMINECTOMY     L4-L5   OTHER  SURGICAL HISTORY     tennis elbow surgery   piriformis release  09/22/1997   right hip   TONSILLECTOMY AND ADENOIDECTOMY  age 75   VAGINAL HYSTERECTOMY  09/23/1991   Patient Active Problem List   Diagnosis Date Noted   Tear of right gluteus medius tendon    Sore in nose 07/30/2021   Involuntary movements 02/14/2020   Depression with anxiety 02/14/2020   Aortic atherosclerosis ([place]) 02/07/2020   Delayed gastric emptying 02/07/2020   Dystonia    Restless legs 07/27/2018   Recurrent UTI 10/07/2016   Major depression in full remission ([place]) 09/02/2016   GERD (gastroesophageal reflux disease) 01/30/2016   History of SI/intentional drug overdose 11/22/2015   Atrial fibrillation (HCC)    Chronic pain syndrome    Hyperlipidemia     REFERRING PROVIDER: Vanetta Mulders, MD  REFERRING DIAG: S76.011A (ICD-10-CM) - Tear of right gluteus medius tendon, initial encounter   THERAPY DIAG:  Stiffness of right hip, not elsewhere classified  Muscle weakness (generalized)  Difficulty in walking, not elsewhere classified  Pain in right hip  Rationale for Evaluation and Treatment Rehabilitation  ONSET DATE: DOS 05/19/2022  SUBJECTIVE:   SUBJECTIVE STATEMENT: Pt is 3  week  s/p Right hip gluteus medius/minimus repair and trochanteric bursectomy.  Post op plan on op note indicated: Per MD progress to full weight bearing per MD    Patient reports her main area of pain is in her distal quad area. She reports the quad has been very sore    PERTINENT HISTORY:  -R hip gluteus medius/minimus repair and trochanteric bursectomy on 05/19/2022.  50% weight bearing on the right leg for 2 weeks then full weight bearing.  No active abduction, IR or passive ER, adduction at least 6 weeks  -L4-5 Fusion in 2000, chronic pain syndrome, Atrial fibrillation, fibromyalgia, Depression, TIA  -R piriformis release in 1999 hip     PAIN:  Are you having pain? Yes NPRS:  Current:  6/10, Worst:  8-9/10, Best:   2/10 Location:  R lateral hip and lateral thigh  PRECAUTIONS: Other: per surgical protocol.  No active abduction, IR or passive ER, adduction at least 6 weeks.  Wb'ing restrictions.  L4-5 Fusion, A-fib  WEIGHT BEARING RESTRICTIONS Yes 50% WB'ing  FALLS:  Has patient fallen in last 6 months? Yes. Number of falls 1-2 due to medications  LIVING ENVIRONMENT: Lives with: lives with their spouse and lives with their son Lives in: one story home with an attic Stairs: 5 steps with rail to enter front, 4 steps with rail to enter side, and 4 steps, with rails to enter back  Has following equipment at home: Dan Humphreys - 2 wheeled and rollator and quad cane  OCCUPATION: pt is retired  PLOF: Independent ; Pt ambulated without an AD.  Pt had increased pain with household chores.  She was limited with ambulation and shopping.   PATIENT GOALS pt wants to be able to walk without pain, gardening, and be out with her family.     OBJECTIVE:   DIAGNOSTIC FINDINGS: pt has had prior x rays and MRIs    TODAY'S TREATMENT:  LOWER EXTREMITY ROM:  ROM Right 9/6 PROM Left Eval AROM  Hip flexion 62   Hip extension    Hip abduction 14 18  Hip adduction    Hip internal rotation    Hip external rotation  22  Knee flexion  137  Knee extension    Ankle dorsiflexion    Ankle plantarflexion    Ankle inversion    Ankle eversion     (Blank rows = not tested)    GAIT: Comments: Pt ambulated with rollator with appropriate 50% Wb'ing restrictions.   Today's Treatment:  9/11  Manual: Passive Flexion abduction and IR; Roller to quad for desensitization  Pt performed  Ankle pumps x 20 reps  Quad sets with 5 sec hold approx 15 reps  Supine Tra contraction approx 12 reps with 5 sec hold  Posterior pelvic tilt x 10  SAQ 3x10  Isometric ADD per protocol 3x10    Last visit  Therapeutic Exercise: -Reviewed current function, pain level, HEP compliance, and response to prior Rx. -Reviewed  HEP. -Pt received hip flexion PROM and abd PROM per pt and tissue tolerance w/n protocol ranges.   Assessed hip PROM -Pt performed  Ankle pumps x 20 reps  Quad sets with 5 sec hold approx 15 reps  Supine Tra contraction approx 12 reps with 5 sec hold  Posterior pelvic tilt x 10  SAQ 2x10     -See below for pt education. -PT updated HEP and gave pt a HEP handout.  Educated pt in correct form and appropriate frequency and also in  correct positioning for HEP.  PATIENT EDUCATION:  Education details:  PT answered Pt's questions including on proper positioning for sleeping.  PT instructed pt in protocol restrictions and ROM limitations.  PT instructed pt to not perform hip abduction and IR.  Objective findings, dx, HEP, Wb'ing restrictions, gait, HEP, and POC.  Instructed pt in using ice. Person educated: Patient Education method: Explanation, Demonstration, Verbal cues, and Handouts Education comprehension: verbalized understanding, returned demonstration, verbal cues required, and needs further education   HOME EXERCISE PROGRAM: Access Code: [email or web address] URL: [email or web address] Date: 05/23/2022 Prepared by: Aaron Edelman  Exercises - ANKLE PUMPS  - 3 x daily - 7 x weekly - 3 sets - 10 reps Updated HEP: - Supine Quadricep Sets  - 2 x daily - 7 x weekly - 2 sets - 10 reps - 5 seconds hold - Supine Transversus Abdominis Bracing - Hands on Stomach  - 2 x daily - 7 x weekly - 2 sets - 10 reps - 5 seconds hold  ASSESSMENT:  CLINICAL IMPRESSION: The patient did well today. The main are she had pain was her distal quad. She was advised that mya be fluid being pushed down with gravity. We worked on desensitizing that area with a roller. She was senstiave in that area. She was advised to do this at home. We added isometric abduction per protocol. Her motion is progressing as we would expect. She has down well since she started weight bearing. We will progress as tolerated.   OBJECTIVE IMPAIRMENTS Abnormal gait, decreased activity tolerance, decreased balance, decreased endurance, decreased mobility, difficulty walking, decreased ROM, decreased strength, hypomobility, impaired flexibility, and pain.   ACTIVITY LIMITATIONS lifting, bending, standing, squatting, stairs, transfers, bed mobility, dressing, and locomotion level  PARTICIPATION LIMITATIONS: cleaning, laundry, driving, shopping, community activity, and gardening  PERSONAL FACTORS Time since onset of injury/illness/exacerbation and 3+ comorbidities: L4-5 Fusion in 2000, chronic pain syndrome, Atrial fibrillation, fibromyalgia, Depression   are also affecting patient's functional outcome.   REHAB POTENTIAL: Good  CLINICAL DECISION MAKING: Stable/uncomplicated  EVALUATION COMPLEXITY: Low   GOALS:  SHORT TERM GOALS: Target date: 06/20/2022  Pt will be independent and compliant with HEP for improved pain, strength, and function Baseline: Goal status: INITIAL  2.  Pt will progress with exercises per protocol without adverse effects to improve strength and mobility.   Baseline:  Goal status: INITIAL Target Date:  07/04/2022   3.  Pt will progress with PROM per protocol without adverse effects for improved stiffness and mobility.  Baseline:  Goal status: INITIAL Target date:  07/18/2022  4.  Pt will progress Wb'ing and appropriately wean AD per MD orders or protocol. Baseline:  Goal status: INITIAL Target date:  07/04/2022  5.  Pt will be able to perform a 6 inch step up with good form and control with L LE leading for improved performance of stairs abd and improved functional strength. Baseline:  Goal status: INITIAL Target date:  08/15/2022   6.  Pt will report improved tolerance with standing and performing household chores.  Baseline:  Goal status: INITIAL Target date:  08/15/2022  LONG TERM GOALS: Target date: 09/11/2022   Pt will ambulate with a normalized heel to toe gait  without limping Baseline:  Goal status: INITIAL  2.  Pt will ambulate extended community distance without an AD without significant pain and difficulty.  Baseline:  Goal status: INITIAL  3.  Pt will be able to perform her normal standing activities and household chores without significant pain.  Baseline:  Goal status: INITIAL  4.    Pt will demo at least 4/5 hip flexion, 4-/5 hip abd, and 5/5 knee extension strength for improved performance of and tolerance with functional mobility.  Baseline:  Goal status: INITIAL  5.  Pt will be able to perform stairs with a reciprocal gait with a rail with good control.  Baseline:  Goal status: INITIAL     PLAN: PT FREQUENCY:  1x/wk x 4 week and 2x/wk afterwards  PT DURATION: other: 16 weeks  PLANNED INTERVENTIONS: Therapeutic exercises, Therapeutic activity, Neuromuscular re-education, Balance training, Gait training, Patient/Family education, Self Care, Joint mobilization, Stair training, DME instructions, Aquatic Therapy, Dry Needling, Cryotherapy, Moist heat, scar mobilization, Taping, Ultrasound, Manual therapy, and Re-evaluation  PLAN FOR NEXT SESSION: Cont per Dr. Eddie Dibbles gluteus medius repair protocol.  Post op plan on op note indicated: 50% weight bearing on the right leg for 2 weeks then full weight bearing.  No active abduction, IR or passive ER, adduction at least 6 weeks   Carolyne Littles PT DPT  06/02/22 11:51 AM

## 2022-06-05 ENCOUNTER — Telehealth: Payer: Self-pay | Admitting: Orthopaedic Surgery

## 2022-06-05 NOTE — Telephone Encounter (Signed)
Received $25.00 check and medical records release form/ Forwarding to [facility] today 

## 2022-06-09 ENCOUNTER — Encounter: Payer: Medicare HMO | Admitting: Family Medicine

## 2022-06-11 ENCOUNTER — Ambulatory Visit (HOSPITAL_BASED_OUTPATIENT_CLINIC_OR_DEPARTMENT_OTHER): Payer: Medicare HMO | Admitting: Physical Therapy

## 2022-06-11 ENCOUNTER — Encounter (HOSPITAL_BASED_OUTPATIENT_CLINIC_OR_DEPARTMENT_OTHER): Payer: Self-pay | Admitting: Physical Therapy

## 2022-06-11 DIAGNOSIS — M6281 Muscle weakness (generalized): Secondary | ICD-10-CM | POA: Diagnosis not present

## 2022-06-11 DIAGNOSIS — R262 Difficulty in walking, not elsewhere classified: Secondary | ICD-10-CM | POA: Diagnosis not present

## 2022-06-11 DIAGNOSIS — M25651 Stiffness of right hip, not elsewhere classified: Secondary | ICD-10-CM

## 2022-06-11 DIAGNOSIS — M25552 Pain in left hip: Secondary | ICD-10-CM

## 2022-06-11 DIAGNOSIS — M25551 Pain in right hip: Secondary | ICD-10-CM | POA: Diagnosis not present

## 2022-06-11 DIAGNOSIS — S76011A Strain of muscle, fascia and tendon of right hip, initial encounter: Secondary | ICD-10-CM | POA: Diagnosis not present

## 2022-06-11 NOTE — Therapy (Signed)
OUTPATIENT PHYSICAL THERAPY LOWER EXTREMITY Treatment    Patient Name: Sheneika Walstad MRN: [id number] DOB:1951-04-29, 71 y.o., female Today's Date: 06/11/2022   PT End of Session - 06/11/22 1145     Visit Number 4    Number of Visits 28    Date for PT Re-Evaluation 08/15/22    Authorization Type AET[employer] MCR    PT Start Time 1884    PT Stop Time 1227    PT Time Calculation (min) 42 min    Activity Tolerance Patient tolerated treatment well    Behavior During Therapy WFL for tasks assessed/performed               Past Medical History:  Diagnosis Date   Anemia    Arthritis    DJD, low back, thumb   Atrial fibrillation (HCC)    Xarelto anticoagulation. Flecainide antiarrythmic    Chicken pox    Chronic pain syndrome    On disability. History of bilateral hip pain, low back pain. Gabapentin 400 BID, percocet 1 tablet daily per prior provider.    Colon polyp    awaiting records   Depression    zoloft 100mg , remeron 30mg  per psychiatry. ambien 10mg  per psychiatry to help wtih sleep element.    Dysrhythmia    Dystonia    described as psychogenic dystonia. Pain and twisting from upper chest and up with triggers "wind, creamy food" on TID ativan per psychiatry previously.    Fibromyalgia    GERD (gastroesophageal reflux disease)    omeprazole OTC   Goiter    states multiple imaging tests, has had biopsies   Hyperlipidemia    lovastatin 20mg    Stroke (HCC)    TIA (left side of face and body decreased sensitivity than right face and side)   TIA (transient ischemic attack)    Vaginal atrophy    estrace vaginal cream   Past Surgical History:  Procedure Laterality Date   ATRIAL FIBRILLATION ABLATION     BIOPSY THYROID     fusion l4-l5  09/22/1998   GLUTEUS MINIMUS REPAIR Right 05/19/2022   Procedure: RIGHT HIP GLUTEUS MEDIUS TENDON REPAIR;  Surgeon: Vanetta Mulders, MD;  Location: [facility];  Service: Orthopedics;  Laterality: Right;   LAMINECTOMY     L4-L5   OTHER  SURGICAL HISTORY     tennis elbow surgery   piriformis release  09/22/1997   right hip   TONSILLECTOMY AND ADENOIDECTOMY  age 50   VAGINAL HYSTERECTOMY  09/23/1991   Patient Active Problem List   Diagnosis Date Noted   Tear of right gluteus medius tendon    Sore in nose 07/30/2021   Involuntary movements 02/14/2020   Depression with anxiety 02/14/2020   Aortic atherosclerosis ([place]) 02/07/2020   Delayed gastric emptying 02/07/2020   Dystonia    Restless legs 07/27/2018   Recurrent UTI 10/07/2016   Major depression in full remission ([place]) 09/02/2016   GERD (gastroesophageal reflux disease) 01/30/2016   History of SI/intentional drug overdose 11/22/2015   Atrial fibrillation (HCC)    Chronic pain syndrome    Hyperlipidemia     REFERRING PROVIDER: Vanetta Mulders, MD  REFERRING DIAG: S76.011A (ICD-10-CM) - Tear of right gluteus medius tendon, initial encounter   THERAPY DIAG:  Stiffness of right hip, not elsewhere classified  Muscle weakness (generalized)  Difficulty in walking, not elsewhere classified  Pain in right hip  Pain in left hip  Rationale for Evaluation and Treatment Rehabilitation  ONSET DATE: DOS 05/19/2022  SUBJECTIVE:  SUBJECTIVE STATEMENT: Pt is 3 week  s/p Right hip gluteus medius/minimus repair and trochanteric bursectomy.  Post op plan on op note indicated: Per MD progress to full weight bearing per MD    Patient reports she has been trying to walk more. She reports the actual surgical area has been sore. She has been taking muscle relaxers which helped the spamsing.    PERTINENT HISTORY:  -R hip gluteus medius/minimus repair and trochanteric bursectomy on 05/19/2022.  50% weight bearing on the right leg for 2 weeks then full weight bearing.  No active abduction, IR or passive ER, adduction at least 6 weeks  -L4-5 Fusion in 2000, chronic pain syndrome, Atrial fibrillation, fibromyalgia, Depression, TIA  -R piriformis release in 1999 hip      PAIN:  Are you having pain? Yes NPRS:  Current:  6/10, Worst:  8-9/10, Best:  2/10 No chnahe 9/20   Location:  R lateral hip and lateral thigh Aggravating factors factors: standing walking  Relieving factors: rest  PRECAUTIONS: Other: per surgical protocol.  No active abduction, IR or passive ER, adduction at least 6 weeks.  Wb'ing restrictions.  L4-5 Fusion, A-fib  WEIGHT BEARING RESTRICTIONS Yes 50% WB'ing  FALLS:  Has patient fallen in last 6 months? Yes. Number of falls 1-2 due to medications  LIVING ENVIRONMENT: Lives with: lives with their spouse and lives with their son Lives in: one story home with an attic Stairs: 5 steps with rail to enter front, 4 steps with rail to enter side, and 4 steps, with rails to enter back  Has following equipment at home: Dan Humphreys - 2 wheeled and rollator and quad cane  OCCUPATION: pt is retired  PLOF: Independent ; Pt ambulated without an AD.  Pt had increased pain with household chores.  She was limited with ambulation and shopping.   PATIENT GOALS pt wants to be able to walk without pain, gardening, and be out with her family.     OBJECTIVE:   DIAGNOSTIC FINDINGS: pt has had prior x rays and MRIs    TODAY'S TREATMENT:  LOWER EXTREMITY ROM:  ROM Right 9/6 PROM Left Eval AROM  Hip flexion 62   Hip extension    Hip abduction 14 18  Hip adduction    Hip internal rotation    Hip external rotation  22  Knee flexion  137  Knee extension    Ankle dorsiflexion    Ankle plantarflexion    Ankle inversion    Ankle eversion     (Blank rows = not tested)    GAIT: Comments: Pt ambulated with rollator with appropriate 50% Wb'ing restrictions.   Today's Treatment:  9/20 Manual: Passive Flexion abduction and IR; Roller to quad for desensitization  Pt performed  Ankle pumps x 20 reps  Quad sets with 5 sec hold approx 15 reps  Supine Tra contraction approx 12 reps with 5 sec hold  Posterior pelvic tilt x 10  SAQ  3x10 Isometric hip flexion per protocol   Isometric ADD per protocol 3x10   9/11  Manual: Passive Flexion abduction and IR; Roller to quad for desensitization  Pt performed  Ankle pumps x 20 reps  Quad sets with 5 sec hold approx 15 reps  Supine Tra contraction approx 12 reps with 5 sec hold  Posterior pelvic tilt x 10  SAQ 3x10  Isometric ADD per protocol 3x10    Last visit  Therapeutic Exercise: -Reviewed current function, pain level, HEP compliance, and response to prior Rx. -  Reviewed HEP. -Pt received hip flexion PROM and abd PROM per pt and tissue tolerance w/n protocol ranges.   Assessed hip PROM -Pt performed  Ankle pumps x 20 reps  Quad sets with 5 sec hold approx 15 reps  Supine Tra contraction approx 12 reps with 5 sec hold  Posterior pelvic tilt x 10  SAQ 2x10     -See below for pt education. -PT updated HEP and gave pt a HEP handout.  Educated pt in correct form and appropriate frequency and also in correct positioning for HEP.  PATIENT EDUCATION:  Education details:  PT answered Pt's questions including on proper positioning for sleeping.  PT instructed pt in protocol restrictions and ROM limitations.  PT instructed pt to not perform hip abduction and IR.  Objective findings, dx, HEP, Wb'ing restrictions, gait, HEP, and POC.  Instructed pt in using ice. Person educated: Patient Education method: Explanation, Demonstration, Verbal cues, and Handouts Education comprehension: verbalized understanding, returned demonstration, verbal cues required, and needs further education   HOME EXERCISE PROGRAM: Access Code: [email or web address] URL: [email or web address] Date: 05/23/2022 Prepared by: Aaron Edelman  Exercises - ANKLE PUMPS  - 3 x daily - 7 x weekly - 3 sets - 10 reps Updated HEP: - Supine Quadricep Sets  - 2 x daily - 7 x weekly - 2 sets - 10 reps - 5 seconds hold - Supine Transversus Abdominis Bracing - Hands on Stomach  - 2 x daily - 7 x weekly  - 2 sets - 10 reps - 5 seconds hold  ASSESSMENT:  CLINICAL IMPRESSION: The patient has been using the rolling pin at home which has helped the pain in her distal quad. We focused on her isometrics today. We reviewed hamstring iso's and extension is's per protocol. She reported a mild increase in pain compared to when she came in. Overall she is progressing with her range and function as we would expect.   OBJECTIVE IMPAIRMENTS Abnormal gait, decreased activity tolerance, decreased balance, decreased endurance, decreased mobility, difficulty walking, decreased ROM, decreased strength, hypomobility, impaired flexibility, and pain.   ACTIVITY LIMITATIONS lifting, bending, standing, squatting, stairs, transfers, bed mobility, dressing, and locomotion level  PARTICIPATION LIMITATIONS: cleaning, laundry, driving, shopping, community activity, and gardening  PERSONAL FACTORS Time since onset of injury/illness/exacerbation and 3+ comorbidities: L4-5 Fusion in 2000, chronic pain syndrome, Atrial fibrillation, fibromyalgia, Depression   are also affecting patient's functional outcome.   REHAB POTENTIAL: Good  CLINICAL DECISION MAKING: Stable/uncomplicated  EVALUATION COMPLEXITY: Low   GOALS:  SHORT TERM GOALS: Target date: 06/20/2022  Pt will be independent and compliant with HEP for improved pain, strength, and function Baseline: Goal status: INITIAL  2.  Pt will progress with exercises per protocol without adverse effects to improve strength and mobility.   Baseline:  Goal status: INITIAL Target Date:  07/04/2022   3.  Pt will progress with PROM per protocol without adverse effects for improved stiffness and mobility.  Baseline:  Goal status: INITIAL Target date:  07/18/2022  4.  Pt will progress Wb'ing and appropriately wean AD per MD orders or protocol. Baseline:  Goal status: INITIAL Target date:  07/04/2022  5.  Pt will be able to perform a 6 inch step up with good form and  control with L LE leading for improved performance of stairs abd and improved functional strength. Baseline:  Goal status: INITIAL Target date:  08/15/2022   6.  Pt will report improved tolerance with standing and performing household chores.  Baseline:  Goal  status: INITIAL Target date:  08/15/2022  LONG TERM GOALS: Target date: 09/11/2022   Pt will ambulate with a normalized heel to toe gait without limping Baseline:  Goal status: INITIAL  2.  Pt will ambulate extended community distance without an AD without significant pain and difficulty.  Baseline:  Goal status: INITIAL  3.  Pt will be able to perform her normal standing activities and household chores without significant pain.  Baseline:  Goal status: INITIAL  4.    Pt will demo at least 4/5 hip flexion, 4-/5 hip abd, and 5/5 knee extension strength for improved performance of and tolerance with functional mobility.  Baseline:  Goal status: INITIAL  5.  Pt will be able to perform stairs with a reciprocal gait with a rail with good control.  Baseline:  Goal status: INITIAL     PLAN: PT FREQUENCY:  1x/wk x 4 week and 2x/wk afterwards  PT DURATION: other: 16 weeks  PLANNED INTERVENTIONS: Therapeutic exercises, Therapeutic activity, Neuromuscular re-education, Balance training, Gait training, Patient/Family education, Self Care, Joint mobilization, Stair training, DME instructions, Aquatic Therapy, Dry Needling, Cryotherapy, Moist heat, scar mobilization, Taping, Ultrasound, Manual therapy, and Re-evaluation  PLAN FOR NEXT SESSION: Cont per Dr. Serena CroissantBokshan's gluteus medius repair protocol.  Post op plan on op note indicated: 50% weight bearing on the right leg for 2 weeks then full weight bearing.  No active abduction, IR or passive ER, adduction at least 6 weeks   Lorayne Benderavid Ndidi Nesby PT DPT  06/11/22 11:47 AM

## 2022-06-13 ENCOUNTER — Telehealth: Payer: Self-pay | Admitting: Pharmacist

## 2022-06-13 NOTE — Progress Notes (Signed)
Chronic Care Management Pharmacy Assistant   Name: Danielle Rocha  MRN: [id number] DOB: [birth year]   Reason for Encounter: General Adherence Call    Recent office visits:  12/23/2021 [facility] (PCP)  Marin Olp, MD; -We opted to try a much lower dose of trazodone at 25 to 50 mg -Stay off gabapentin does seem to help restless legs some - Also discussed reviewing this with psychiatry.  I do not think sertraline at 50 mg plus low-dose trazodone place her at significant risk for serotonin syndrome  Recent consult visits:  05/30/2022 [facility] (Orthopedics) Vanetta Mulders, MD; no medication changes indicated.  04/17/2022 [facility] (Orthopedics) Vanetta Mulders, MD; no medication changes indicated.  04/01/2022 [facility] (Orthopedics) Barnet Pall E, NP; no medication changes indicated.  03/21/2022 [facility] (Orthopedics) Vanetta Mulders, MD; no medication changes indicated.  03/20/2022 [facility] (Otolaryngology) Beckie Salts, MD; no medication changes indicated.  01/31/2022 [facility] (Orthopedics) Vanetta Mulders, MD; no medication changes indicated.  5/052023 [facility] (Neurology) Rondel Jumbo, PA-C; Start Emgality as directed   Hospital visits:  05/19/2022 Surgery - Rip Hip Gluteus Medius Tendon Repair  01/05/2022 ED visit for Migraine -Discussed trail of imitrex for headache abortive and recommend follow up with PCP, possible referral to neurology if headaches persist  Medications: Outpatient Encounter Medications as of 06/13/2022  Medication Sig Note   methocarbamol (ROBAXIN) 500 MG tablet Take 1 tablet (500 mg total) by mouth 4 (four) times daily. (Patient not taking: Reported on 05/14/2022)    methocarbamol (ROBAXIN) 500 MG tablet Take 1 tablet (500 mg total) by mouth 4 (four) times daily.    acetaminophen (TYLENOL) 500 MG tablet Take 1,000 mg by mouth every 6 (six) hours as needed for mild pain, moderate pain, fever or headache.    buPROPion (WELLBUTRIN XL) 150 MG 24 hr tablet Take 300 mg by mouth daily.    cephALEXin  (KEFLEX) 250 MG capsule Take 250 mg by mouth daily. 05/14/2022: Continuous therapy   Cholecalciferol (VITAMIN D) 125 MCG (5000 UT) CAPS Take 5,000 Units by mouth daily.    Coenzyme Q10 200 MG TABS Take 200 mg by mouth daily.    Cyanocobalamin (B-12 PO) Take 1 tablet by mouth daily.    cyclobenzaprine (FLEXERIL) 10 MG tablet Take 1 tablet (10 mg total) by mouth 3 (three) times daily as needed for muscle spasms.    EMGALITY 120 MG/ML SOAJ INJECT  240 MG SUBCUTANEOUSLY ONCE FOR 1 DOSE LOADING DOSE (Patient not taking: Reported on 05/14/2022)    flecainide (TAMBOCOR) 100 MG tablet Take 1 tablet (100 mg total) by mouth 2 (two) times daily.    Galcanezumab-gnlm (EMGALITY) 120 MG/ML SOAJ Inject 120 mg into the skin every 30 (thirty) days.    Magnesium Oxide 420 MG TABS Take 420 mg by mouth daily.    melatonin 5 MG TABS Take 5 mg by mouth at bedtime.    Multiple Vitamin (MULTIVITAMIN WITH MINERALS) TABS tablet Take 1 tablet by mouth daily.    ondansetron (ZOFRAN ODT) 4 MG disintegrating tablet Take 1 tablet (4 mg total) by mouth every 8 (eight) hours as needed for nausea or vomiting.    oxyCODONE (OXY IR/ROXICODONE) 5 MG immediate release tablet Take 1 tablet (5 mg total) by mouth every 4 (four) hours as needed (severe pain). (Patient not taking: Reported on 05/14/2022)    oxyCODONE (OXY IR/ROXICODONE) 5 MG immediate release tablet Take 1 tablet (5 mg total) by mouth every 4 (four) hours as needed (severe pain).    pantoprazole (  PROTONIX) 20 MG tablet TAKE 1 TABLET BY MOUTH TWICE DAILY BEFORE A MEAL.    pyridOXINE (VITAMIN B6) 100 MG tablet Take 100 mg by mouth daily.    rivaroxaban (XARELTO) 20 MG TABS tablet Take 1 tablet (20 mg total) by mouth daily with supper.    rosuvastatin (CRESTOR) 20 MG tablet Take 1 tablet by mouth once daily    sertraline (ZOLOFT) 50 MG tablet Take 1 tablet (50 mg total) by mouth daily. (Patient taking differently: Take 50 mg by mouth in the morning and at bedtime.)     SUMAtriptan (IMITREX) 100 MG tablet Take 1 tablet (100 mg total) by mouth once as needed for up to 1 dose for migraine.    traZODone (DESYREL) 50 MG tablet Take 0.5-1 tablets (25-50 mg total) by mouth at bedtime as needed for sleep.    No facility-administered encounter medications on file as of 06/13/2022.    Contacted Danielle Rocha for [employer] Review Call   Chart Review:  Have there been any documented new, changed, or discontinued medications since last visit? No  Has there been any documented recent hospitalizations or ED visits since last visit with Clinical Pharmacist? No   Adherence Review:  Does the Clinical Pharmacist Assistant have access to adherence rates? Yes Adherence rates for STAR metric medications: Rosuvastatin 20 mg last filled 06/02/2022 90 DS Does the patient have >5 day gap between last estimated fill dates for any of the above medications or other medication gaps? No Reason for medication gaps.   Disease State Questions:  Able to connect with Patient? Yes Did patient have any problems with their health recently? Yes Note problems and Concerns: Patient recently had surgery to repair a muscle tear in her right hip/gluteus. Have you had any admissions or emergency room visits or worsening of your condition(s) since last visit? No Have you had any visits with new specialists or providers since your last visit? No Have you had any new health care problem(s) since your last visit? No Have you run out of any of your medications since you last spoke with clinical pharmacist? No Are there any medications you are not taking as prescribed? No Are you having any issues or side effects with your medications? No Do you have any other health concerns or questions you want to discuss with your Clinical Pharmacist before your next visit? No Are there any health concerns that you feel we can do a better job addressing? No Are you having any problems with any of the following  since the last visit: (select all that apply)  None 12. Any falls since last visit? No 13. Any increased or uncontrolled pain since last visit? No   Care Gaps: Medicare Annual Wellness: Completed 09/09/2021 Hemoglobin A1C: none available Colonoscopy: Next due on 01/01/2030 Dexa Scan: Completed Mammogram: Next due on 08/28/2023  Future Appointments  Date Time Provider Department Center  06/19/2022  8:45 AM Dessie Coma, PT [facility]  06/27/2022  8:45 AM Huel Cote, MD [facility]  07/28/2022 11:30 AM Marcos Eke, PA-C [facility] None  09/29/2022  3:15 PM [facility] HEALTH COACH [facility]  12/02/2022  3:00 PM [facility] PHARMACIST [facility]   Star Rating Drugs: Rosuvastatin 20 mg last filled 06/02/2022 90 DS  April D Calhoun, [facility] Clinical Pharmacist Assistant [phone]

## 2022-06-15 ENCOUNTER — Other Ambulatory Visit: Payer: Self-pay | Admitting: Physician Assistant

## 2022-06-16 ENCOUNTER — Encounter (HOSPITAL_BASED_OUTPATIENT_CLINIC_OR_DEPARTMENT_OTHER): Payer: Self-pay | Admitting: Orthopaedic Surgery

## 2022-06-16 ENCOUNTER — Encounter: Payer: Self-pay | Admitting: *Deleted

## 2022-06-19 ENCOUNTER — Ambulatory Visit (HOSPITAL_BASED_OUTPATIENT_CLINIC_OR_DEPARTMENT_OTHER): Payer: Medicare HMO | Admitting: Physical Therapy

## 2022-06-19 DIAGNOSIS — R262 Difficulty in walking, not elsewhere classified: Secondary | ICD-10-CM

## 2022-06-19 DIAGNOSIS — M25552 Pain in left hip: Secondary | ICD-10-CM | POA: Diagnosis not present

## 2022-06-19 DIAGNOSIS — M6281 Muscle weakness (generalized): Secondary | ICD-10-CM

## 2022-06-19 DIAGNOSIS — M25651 Stiffness of right hip, not elsewhere classified: Secondary | ICD-10-CM

## 2022-06-19 DIAGNOSIS — M25551 Pain in right hip: Secondary | ICD-10-CM | POA: Diagnosis not present

## 2022-06-19 DIAGNOSIS — S76011A Strain of muscle, fascia and tendon of right hip, initial encounter: Secondary | ICD-10-CM | POA: Diagnosis not present

## 2022-06-19 NOTE — Therapy (Signed)
OUTPATIENT PHYSICAL THERAPY LOWER EXTREMITY Treatment    Patient Name: Danielle Rocha MRN: [id number] DOB:[birth year], 71 y.o., female Today's Date: 06/19/2022   PT End of Session - 06/19/22 0855     Visit Number 5    Number of Visits 28    Date for PT Re-Evaluation 08/15/22    Authorization Type [employer]ET[employer] MCR    PT Start Time 0849    PT Stop Time 0933    PT Time Calculation (min) 44 min    Activity Tolerance Patient tolerated treatment well    Behavior During Therapy WFL for tasks assessed/performed                Past Medical History:  Diagnosis Date   Anemia    Arthritis    DJD, low back, thumb   Atrial fibrillation (HCC)    Xarelto anticoagulation. Flecainide antiarrythmic    Chicken pox    Chronic pain syndrome    On disability. History of bilateral hip pain, low back pain. Gabapentin 400 BID, percocet 1 tablet daily per prior provider.    Colon polyp    awaiting records   Depression    zoloft 100mg , remeron 30mg  per psychiatry. ambien 10mg  per psychiatry to help wtih sleep element.    Dysrhythmia    Dystonia    described as psychogenic dystonia. Pain and twisting from upper chest and up with triggers "wind, creamy food" on TID ativan per psychiatry previously.    Fibromyalgia    GERD (gastroesophageal reflux disease)    omeprazole OTC   Goiter    states multiple imaging tests, has had biopsies   Hyperlipidemia    lovastatin 20mg    Stroke (HCC)    TIA (left side of face and body decreased sensitivity than right face and side)   TIA (transient ischemic attack)    Vaginal atrophy    estrace vaginal cream   Past Surgical History:  Procedure Laterality Date   ATRIAL FIBRILLATION ABLATION     BIOPSY THYROID     fusion l4-l5  09/22/1998   GLUTEUS MINIMUS REPAIR Right 05/19/2022   Procedure: RIGHT HIP GLUTEUS MEDIUS TENDON REPAIR;  Surgeon: , MD;  Location: [facility] OR;  Service: Orthopedics;  Laterality: Right;   LAMINECTOMY     L4-L5   OTHER  SURGICAL HISTORY     tennis elbow surgery   piriformis release  09/22/1997   right hip   TONSILLECTOMY AND ADENOIDECTOMY  age 82   VAGINAL HYSTERECTOMY  09/23/1991   Patient Active Problem List   Diagnosis Date Noted   Tear of right gluteus medius tendon    Sore in nose 07/30/2021   Involuntary movements 02/14/2020   Depression with anxiety 02/14/2020   Aortic atherosclerosis (HCC) 02/07/2020   Delayed gastric emptying 02/07/2020   Dystonia    Restless legs 07/27/2018   Recurrent UTI 10/07/2016   Major depression in full remission (HCC) 09/02/2016   GERD (gastroesophageal reflux disease) 01/30/2016   History of SI/intentional drug overdose 11/22/2015   Atrial fibrillation (HCC)    Chronic pain syndrome    Hyperlipidemia     REFERRING PROVIDER: 10/09/2016, MD  REFERRING DIAG: S76.011A (ICD-10-CM) - Tear of right gluteus medius tendon, initial encounter   THERAPY DIAG:  Stiffness of right hip, not elsewhere classified  Muscle weakness (generalized)  Difficulty in walking, not elsewhere classified  Rationale for Evaluation and Treatment Rehabilitation  ONSET DATE: DOS 05/19/2022  SUBJECTIVE:   SUBJECTIVE STATEMENT: Pt is 4 week  s/p Right  hip gluteus medius/minimus repair and trochanteric bursectomy.  Post op plan on op note indicated: Per MD progress to full weight bearing per MD    The patient had a slip in the shower today. She felt like it increased her pain in her hip. She had been feeling really good. She had had minimal pain.  PERTINENT HISTORY:  -R hip gluteus medius/minimus repair and trochanteric bursectomy on 05/19/2022.  50% weight bearing on the right leg for 2 weeks then full weight bearing.  No active abduction, IR or passive ER, adduction at least 6 weeks  -L4-5 Fusion in 2000, chronic pain syndrome, Atrial fibrillation, fibromyalgia, Depression, TIA  -R piriformis release in 1999 hip     PAIN:  Are you having pain? Yes NPRS:  Current:  6/10,  Worst:  8-9/10, Best:  2/10 No chnahe 9/20   Location:  R lateral hip and lateral thigh Aggravating factors factors: standing walking  Relieving factors: rest  PRECAUTIONS: Other: per surgical protocol.  No active abduction, IR or passive ER, adduction at least 6 weeks.  Wb'ing restrictions.  L4-5 Fusion, A-fib  WEIGHT BEARING RESTRICTIONS Yes 50% WB'ing  FALLS:  Has patient fallen in last 6 months? Yes. Number of falls 1-2 due to medications  LIVING ENVIRONMENT: Lives with: lives with their spouse and lives with their son Lives in: one story home with an attic Stairs: 5 steps with rail to enter front, 4 steps with rail to enter side, and 4 steps, with rails to enter back  Has following equipment at home: Dan Humphreys - 2 wheeled and rollator and quad cane  OCCUPATION: pt is retired  PLOF: Independent ; Pt ambulated without an AD.  Pt had increased pain with household chores.  She was limited with ambulation and shopping.   PATIENT GOALS pt wants to be able to walk without pain, gardening, and be out with her family.     OBJECTIVE:   DIAGNOSTIC FINDINGS: pt has had prior x rays and MRIs    TODAY'S TREATMENT:  LOWER EXTREMITY ROM:  ROM Right 9/6 PROM Left Eval AROM  Hip flexion 62   Hip extension    Hip abduction 14 18  Hip adduction    Hip internal rotation    Hip external rotation  22  Knee flexion  137  Knee extension    Ankle dorsiflexion    Ankle plantarflexion    Ankle inversion    Ankle eversion     (Blank rows = not tested)    GAIT: Comments: Pt ambulated with rollator with appropriate 50% Wb'ing restrictions.   Today's Treatment:  09/28 Manual: Passive Flexion abduction and IR; Roller to quad for desensitization  Pt performed    Quad sets with 5 sec hold approx 15 reps  Supine Tra contraction approx 12 reps with 5 sec hold  Posterior pelvic tilt x 10  SAQ 3x10 Isometric hip flexion per protocol   Isometric ADD per protocol 3x10 Isometric hip  flexion per protocol  1/4 bridge 3x10    9/20 Manual: Passive Flexion abduction and IR; Roller to quad for desensitization  Pt performed  Ankle pumps x 20 reps  Quad sets with 5 sec hold approx 15 reps  Supine Tra contraction approx 12 reps with 5 sec hold  Posterior pelvic tilt x 10  SAQ 3x10 Isometric hip flexion per protocol   Isometric ADD per protocol 3x10  PATIENT EDUCATION:  Education details:  PT answered Pt's questions including on proper positioning for sleeping.  PT instructed pt in protocol restrictions and ROM limitations.  PT instructed pt to not perform hip abduction and IR.  Objective findings, dx, HEP, Wb'ing restrictions, gait, HEP, and POC.  Instructed pt in using ice. Person educated: Patient Education method: Explanation, Demonstration, Verbal cues, and Handouts Education comprehension: verbalized understanding, returned demonstration, verbal cues required, and needs further education   HOME EXERCISE PROGRAM: Access Code: [email or web address] URL: [email or web address] Date: 05/23/2022 Prepared by: Ronny Flurry  Exercises - ANKLE PUMPS  - 3 x daily - 7 x weekly - 3 sets - 10 reps Updated HEP: - Supine Quadricep Sets  - 2 x daily - 7 x weekly - 2 sets - 10 reps - 5 seconds hold - Supine Transversus Abdominis Bracing - Hands on Stomach  - 2 x daily - 7 x weekly - 2 sets - 10 reps - 5 seconds hold  ASSESSMENT:  CLINICAL IMPRESSION: The patient tolerated exercises well despite slip today. She could feel some of the exercises but she was able to complete them. She tolerated 1/4 bridges well. Therapy will continue to progress per protocol. She was advised over the next few days to work on her exercises to reduce soreness. She had no significant pain with hip flexion iso's.   OBJECTIVE IMPAIRMENTS Abnormal gait, decreased activity tolerance, decreased balance, decreased endurance, decreased mobility, difficulty walking, decreased ROM, decreased strength,  hypomobility, impaired flexibility, and pain.   ACTIVITY LIMITATIONS lifting, bending, standing, squatting, stairs, transfers, bed mobility, dressing, and locomotion level  PARTICIPATION LIMITATIONS: cleaning, laundry, driving, shopping, community activity, and gardening  PERSONAL FACTORS Time since onset of injury/illness/exacerbation and 3+ comorbidities: L4-5 Fusion in 2000, chronic pain syndrome, Atrial fibrillation, fibromyalgia, Depression   are also affecting patient's functional outcome.   REHAB POTENTIAL: Good  CLINICAL DECISION MAKING: Stable/uncomplicated  EVALUATION COMPLEXITY: Low   GOALS:  SHORT TERM GOALS: Target date: 06/20/2022  Pt will be independent and compliant with HEP for improved pain, strength, and function Baseline: Goal status: INITIAL  2.  Pt will progress with exercises per protocol without adverse effects to improve strength and mobility.   Baseline:  Goal status: INITIAL Target Date:  07/04/2022   3.  Pt will progress with PROM per protocol without adverse effects for improved stiffness and mobility.  Baseline:  Goal status: INITIAL Target date:  07/18/2022  4.  Pt will progress Wb'ing and appropriately wean AD per MD orders or protocol. Baseline:  Goal status: INITIAL Target date:  07/04/2022  5.  Pt will be able to perform a 6 inch step up with good form and control with L LE leading for improved performance of stairs abd and improved functional strength. Baseline:  Goal status: INITIAL Target date:  08/15/2022   6.  Pt will report improved tolerance with standing and performing household chores.  Baseline:  Goal status: INITIAL Target date:  08/15/2022  LONG TERM GOALS: Target date: 09/11/2022   Pt will ambulate with a normalized heel to toe gait without limping Baseline:  Goal status: INITIAL  2.  Pt will ambulate extended community distance without an AD without significant pain and difficulty.  Baseline:  Goal status:  INITIAL  3.  Pt will be able to perform her normal standing activities and household chores without significant pain.  Baseline:  Goal status: INITIAL  4.    Pt will demo at least 4/5 hip flexion, 4-/5 hip abd, and 5/5 knee extension strength for improved performance of and tolerance with functional mobility.  Baseline:  Goal  status: INITIAL  5.  Pt will be able to perform stairs with a reciprocal gait with a rail with good control.  Baseline:  Goal status: INITIAL     PLAN: PT FREQUENCY:  1x/wk x 4 week and 2x/wk afterwards  PT DURATION: other: 16 weeks  PLANNED INTERVENTIONS: Therapeutic exercises, Therapeutic activity, Neuromuscular re-education, Balance training, Gait training, Patient/Family education, Self Care, Joint mobilization, Stair training, DME instructions, Aquatic Therapy, Dry Needling, Cryotherapy, Moist heat, scar mobilization, Taping, Ultrasound, Manual therapy, and Re-evaluation  PLAN FOR NEXT SESSION: Cont per Dr. Serena Croissant gluteus medius repair protocol.  Post op plan on op note indicated: 50% weight bearing on the right leg for 2 weeks then full weight bearing.  No active abduction, IR or passive ER, adduction at least 6 weeks   Lorayne Bender PT DPT  06/19/22 10:03 AM

## 2022-06-26 ENCOUNTER — Ambulatory Visit (HOSPITAL_BASED_OUTPATIENT_CLINIC_OR_DEPARTMENT_OTHER): Payer: Medicare HMO | Attending: Orthopaedic Surgery | Admitting: Physical Therapy

## 2022-06-26 ENCOUNTER — Encounter (HOSPITAL_BASED_OUTPATIENT_CLINIC_OR_DEPARTMENT_OTHER): Payer: Self-pay | Admitting: Physical Therapy

## 2022-06-26 DIAGNOSIS — M6281 Muscle weakness (generalized): Secondary | ICD-10-CM | POA: Diagnosis not present

## 2022-06-26 DIAGNOSIS — M25551 Pain in right hip: Secondary | ICD-10-CM

## 2022-06-26 DIAGNOSIS — M25651 Stiffness of right hip, not elsewhere classified: Secondary | ICD-10-CM | POA: Diagnosis not present

## 2022-06-26 DIAGNOSIS — R262 Difficulty in walking, not elsewhere classified: Secondary | ICD-10-CM | POA: Diagnosis not present

## 2022-06-26 NOTE — Therapy (Signed)
OUTPATIENT PHYSICAL THERAPY LOWER EXTREMITY Treatment    Patient Name: Danielle Rocha MRN: [id number] DOB:December 28, 1950, 71 y.o., female Today's Date: 06/27/2022   PT End of Session - 06/26/22 1115     Visit Number 6    Number of Visits 28    Date for PT Re-Evaluation 08/15/22    Authorization Type [employer]ET[employer] MCR    PT Start Time 1110    PT Stop Time 1145    PT Time Calculation (min) 35 min    Activity Tolerance Patient tolerated treatment well    Behavior During Therapy WFL for tasks assessed/performed                 Past Medical History:  Diagnosis Date   Anemia    Arthritis    DJD, low back, thumb   Atrial fibrillation (HCC)    Xarelto anticoagulation. Flecainide antiarrythmic    Chicken pox    Chronic pain syndrome    On disability. History of bilateral hip pain, low back pain. Gabapentin 400 BID, percocet 1 tablet daily per prior provider.    Colon polyp    awaiting records   Depression    zoloft 100mg , remeron 30mg  per psychiatry. ambien 10mg  per psychiatry to help wtih sleep element.    Dysrhythmia    Dystonia    described as psychogenic dystonia. Pain and twisting from upper chest and up with triggers "wind, creamy food" on TID ativan per psychiatry previously.    Fibromyalgia    GERD (gastroesophageal reflux disease)    omeprazole OTC   Goiter    states multiple imaging tests, has had biopsies   Hyperlipidemia    lovastatin 20mg    Stroke (HCC)    TIA (left side of face and body decreased sensitivity than right face and side)   TIA (transient ischemic attack)    Vaginal atrophy    estrace vaginal cream   Past Surgical History:  Procedure Laterality Date   ATRIAL FIBRILLATION ABLATION     BIOPSY THYROID     fusion l4-l5  09/22/1998   GLUTEUS MINIMUS REPAIR Right 05/19/2022   Procedure: RIGHT HIP GLUTEUS MEDIUS TENDON REPAIR;  Surgeon: , MD;  Location: [facility] OR;  Service: Orthopedics;  Laterality: Right;   LAMINECTOMY     L4-L5   OTHER  SURGICAL HISTORY     tennis elbow surgery   piriformis release  09/22/1997   right hip   TONSILLECTOMY AND ADENOIDECTOMY  age 13   VAGINAL HYSTERECTOMY  09/23/1991   Patient Active Problem List   Diagnosis Date Noted   Tear of right gluteus medius tendon    Sore in nose 07/30/2021   Involuntary movements 02/14/2020   Depression with anxiety 02/14/2020   Aortic atherosclerosis (HCC) 02/07/2020   Delayed gastric emptying 02/07/2020   Dystonia    Restless legs 07/27/2018   Recurrent UTI 10/07/2016   Major depression in full remission (HCC) 09/02/2016   GERD (gastroesophageal reflux disease) 01/30/2016   History of SI/intentional drug overdose 11/22/2015   Atrial fibrillation (HCC)    Chronic pain syndrome    Hyperlipidemia     REFERRING PROVIDER: 10/09/2016, MD  REFERRING DIAG: S76.011A (ICD-10-CM) - Tear of right gluteus medius tendon, initial encounter   THERAPY DIAG:  Stiffness of right hip, not elsewhere classified  Muscle weakness (generalized)  Difficulty in walking, not elsewhere classified  Pain in right hip  Rationale for Evaluation and Treatment Rehabilitation  ONSET DATE: DOS 05/19/2022  SUBJECTIVE:   SUBJECTIVE STATEMENT: Pt  is 5 weeks and 3 days s/p Right hip gluteus medius/minimus repair and trochanteric bursectomy.  Post op plan on op note indicated: Per MD progress to full weight bearing per MD    Pt states she is doing much better.  Pt has been rolling her thigh and reports her thigh is 100% better.  "It's so much better"  Pt has problems adjusting at night and has difficulty sleeping.  Pt reports increased pain to 7/10 after prior Rx.  Pt is ambulating with a quad cane.  Pt is driving without significant pain.     PERTINENT HISTORY:  -R hip gluteus medius/minimus repair and trochanteric bursectomy on 05/19/2022.  50% weight bearing on the right leg for 2 weeks then full weight bearing.  No active abduction, IR or passive ER, adduction at least 6  weeks  -L4-5 Fusion in 2000, chronic pain syndrome, Atrial fibrillation, fibromyalgia, Depression, TIA  -R piriformis release in 1999 hip     PAIN:  Are you having pain? Yes NPRS:  Current: 3/10, Worst:  7/10, Best:  2/10     Location:  R lateral hip  Aggravating factors factors: standing walking  Relieving factors: rest   PRECAUTIONS: Other: per surgical protocol.  No active abduction, IR or passive ER, adduction at least 6 weeks.  Wb'ing restrictions.  L4-5 Fusion, A-fib  WEIGHT BEARING RESTRICTIONS Yes    FALLS:  Has patient fallen in last 6 months? Yes. Number of falls 1-2 due to medications  LIVING ENVIRONMENT: Lives with: lives with their spouse and lives with their son Lives in: one story home with an attic Stairs: 5 steps with rail to enter front, 4 steps with rail to enter side, and 4 steps, with rails to enter back  Has following equipment at home: Gilford Rile - 2 wheeled and rollator and quad cane  OCCUPATION: pt is retired  PLOF: Independent ; Pt ambulated without an AD.  Pt had increased pain with household chores.  She was limited with ambulation and shopping.   PATIENT GOALS pt wants to be able to walk without pain, gardening, and be out with her family.     OBJECTIVE:   DIAGNOSTIC FINDINGS: pt has had prior x rays and MRIs    TODAY'S TREATMENT:  LOWER EXTREMITY ROM:  ROM Right 9/6 PROM Right 10/5 PROM Left Eval AROM  Hip flexion 62 78   Hip extension     Hip abduction 14 13 18   Hip adduction     Hip internal rotation     Hip external rotation   22  Knee flexion   137  Knee extension     Ankle dorsiflexion     Ankle plantarflexion     Ankle inversion     Ankle eversion      (Blank rows = not tested)   Pt received Passive Flexion, abduction, and IR per pt tolerance within protocol ranges  Pt performed  1/4 to 1/2 Rocha 2x10    Posterior pelvic tilt x 10  SAQ 3x10 Isometric ADD per protocol with 5 sec hold 2x10 Isometric hip flexion per  protocol in hooklying with 5 sec hold 2x10   PATIENT EDUCATION:  Education details:  Protocol restrictions and expectations including ROM.  PT instructed pt to not perform hip abduction and IR.  Objective findings, dx, HEP, gait, HEP, and POC.  Instructed pt in using ice. Person educated: Patient Education method: Explanation, Demonstration, Verbal cues, and Handouts Education comprehension: verbalized understanding, returned demonstration, verbal cues required,  and needs further education   HOME EXERCISE PROGRAM: Access Code: [email or web address] URL: [email or web address] Date: 05/23/2022 Prepared by: Aaron Edelman  Exercises - ANKLE PUMPS  - 3 x daily - 7 x weekly - 3 sets - 10 reps Updated HEP: - Supine Quadricep Sets  - 2 x daily - 7 x weekly - 2 sets - 10 reps - 5 seconds hold - Supine Transversus Abdominis Bracing - Hands on Stomach  - 2 x daily - 7 x weekly - 2 sets - 10 reps - 5 seconds hold  ASSESSMENT:  CLINICAL IMPRESSION: Pt reports improved worst pain.  She has been rolling her quad out and reports significant improved quad pain.  PROM was performed w/n protocol ranges.  She demonstrates improved R hip flexion PROM though is still limited.  Pt is improving with gait and using a quad cane.  Pt able to increase height with Rocha.  Pt did report some pain with hip add isometric.  PT instructed pt in performing submax iso's more gently and pt didn't have pain.  Pt reports increased pain from 3/10 before Rx to 5/10 after Rx.  Pt should benefit form cont skilled PT services per protocol to address impairments and goals and to improve function.     OBJECTIVE IMPAIRMENTS Abnormal gait, decreased activity tolerance, decreased balance, decreased endurance, decreased mobility, difficulty walking, decreased ROM, decreased strength, hypomobility, impaired flexibility, and pain.   ACTIVITY LIMITATIONS lifting, bending, standing, squatting, stairs, transfers, bed mobility, dressing,  and locomotion level  PARTICIPATION LIMITATIONS: cleaning, laundry, driving, shopping, community activity, and gardening  PERSONAL FACTORS Time since onset of injury/illness/exacerbation and 3+ comorbidities: L4-5 Fusion in 2000, chronic pain syndrome, Atrial fibrillation, fibromyalgia, Depression   are also affecting patient's functional outcome.   REHAB POTENTIAL: Good  CLINICAL DECISION MAKING: Stable/uncomplicated  EVALUATION COMPLEXITY: Low   GOALS:  SHORT TERM GOALS: Target date: 06/20/2022  Pt will be independent and compliant with HEP for improved pain, strength, and function Baseline: Goal status: INITIAL  2.  Pt will progress with exercises per protocol without adverse effects to improve strength and mobility.   Baseline:  Goal status: INITIAL Target Date:  07/04/2022   3.  Pt will progress with PROM per protocol without adverse effects for improved stiffness and mobility.  Baseline:  Goal status: INITIAL Target date:  07/18/2022  4.  Pt will progress Wb'ing and appropriately wean AD per MD orders or protocol. Baseline:  Goal status: INITIAL Target date:  07/04/2022  5.  Pt will be able to perform a 6 inch step up with good form and control with L LE leading for improved performance of stairs abd and improved functional strength. Baseline:  Goal status: INITIAL Target date:  08/15/2022   6.  Pt will report improved tolerance with standing and performing household chores.  Baseline:  Goal status: INITIAL Target date:  08/15/2022  LONG TERM GOALS: Target date: 09/11/2022   Pt will ambulate with a normalized heel to toe gait without limping Baseline:  Goal status: INITIAL  2.  Pt will ambulate extended community distance without an AD without significant pain and difficulty.  Baseline:  Goal status: INITIAL  3.  Pt will be able to perform her normal standing activities and household chores without significant pain.  Baseline:  Goal status:  INITIAL  4.    Pt will demo at least 4/5 hip flexion, 4-/5 hip abd, and 5/5 knee extension strength for improved performance of and tolerance with functional mobility.  Baseline:  Goal status: INITIAL  5.  Pt will be able to perform stairs with a reciprocal gait with a rail with good control.  Baseline:  Goal status: INITIAL     PLAN: PT FREQUENCY:  1x/wk x 4 week and 2x/wk afterwards  PT DURATION: other: 16 weeks  PLANNED INTERVENTIONS: Therapeutic exercises, Therapeutic activity, Neuromuscular re-education, Balance training, Gait training, Patient/Family education, Self Care, Joint mobilization, Stair training, DME instructions, Aquatic Therapy, Dry Needling, Cryotherapy, Moist heat, scar mobilization, Taping, Ultrasound, Manual therapy, and Re-evaluation  PLAN FOR NEXT SESSION: Cont per Dr. Serena Croissant gluteus medius repair protocol.  Post op plan on op note indicated: 50% weight bearing on the right leg for 2 weeks then full weight bearing.  No active abduction, IR or passive ER, adduction at least 6 weeks   Audie Clear III PT, DPT 06/27/22 10:12 AM

## 2022-06-27 ENCOUNTER — Ambulatory Visit (INDEPENDENT_AMBULATORY_CARE_PROVIDER_SITE_OTHER): Payer: Medicare HMO | Admitting: Orthopaedic Surgery

## 2022-06-27 DIAGNOSIS — S76011A Strain of muscle, fascia and tendon of right hip, initial encounter: Secondary | ICD-10-CM

## 2022-06-27 NOTE — Progress Notes (Signed)
Post Operative Evaluation    Procedure/Date of Surgery: right hip gluteus medius repair 05/19/22  Interval History:    Today 6 weeks status post the above procedure.  At this time she continues to walk with a cane in the left hand and is continuing to improve.  Patient does have somewhat of an antalgic gait.  She has been working diligently in physical therapy.   PMH/PSH/Family History/Social History/Meds/Allergies:    Past Medical History:  Diagnosis Date   Anemia    Arthritis    DJD, low back, thumb   Atrial fibrillation (HCC)    Xarelto anticoagulation. Flecainide antiarrythmic    Chicken pox    Chronic pain syndrome    On disability. History of bilateral hip pain, low back pain. Gabapentin 400 BID, percocet 1 tablet daily per prior provider.    Colon polyp    awaiting records   Depression    zoloft 100mg , remeron 30mg  per psychiatry. ambien 10mg  per psychiatry to help wtih sleep element.    Dysrhythmia    Dystonia    described as psychogenic dystonia. Pain and twisting from upper chest and up with triggers "wind, creamy food" on TID ativan per psychiatry previously.    Fibromyalgia    GERD (gastroesophageal reflux disease)    omeprazole OTC   Goiter    states multiple imaging tests, has had biopsies   Hyperlipidemia    lovastatin 20mg    Stroke (HCC)    TIA (left side of face and body decreased sensitivity than right face and side)   TIA (transient ischemic attack)    Vaginal atrophy    estrace vaginal cream   Past Surgical History:  Procedure Laterality Date   ATRIAL FIBRILLATION ABLATION     BIOPSY THYROID     fusion l4-l5  09/22/1998   GLUTEUS MINIMUS REPAIR Right 05/19/2022   Procedure: RIGHT HIP GLUTEUS MEDIUS TENDON REPAIR;  Surgeon: , MD;  Location: [facility] OR;  Service: Orthopedics;  Laterality: Right;   LAMINECTOMY     L4-L5   OTHER SURGICAL HISTORY     tennis elbow surgery   piriformis release  09/22/1997    right hip   TONSILLECTOMY AND ADENOIDECTOMY  age 76   VAGINAL HYSTERECTOMY  09/23/1991   Social History   Socioeconomic History   Marital status: Married    Spouse name: Not on file   Number of children: 3   Years of education: 13   Highest education level: Not on file  Occupational History   Occupation: retired  Tobacco Use   Smoking status: Never   Smokeless tobacco: Never  Vaping Use   Vaping Use: Never used  Substance and Sexual Activity   Alcohol use: No    Alcohol/week: 0.0 standard drinks of alcohol   Drug use: No   Sexual activity: Not Currently    Partners: Male  Other Topics Concern   Not on file  Social History Narrative   Family: Married. Husband works as [employer] for 11/20/1997 center. 2 children from previous marriage 1 from current. Son 9 lives with them and has down's syndrome.       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 Moved to [facility] from [place] and became disabled in that time frame due to hip/back issues.       Hobbies:  time with son   Right handed   One story home   Caffeine occasionally   Social Determinants of Health   Financial Resource Strain: Low Risk  (09/09/2021)   Overall Financial Resource Strain (CARDIA)    Difficulty of Paying Living Expenses: Not hard at all  Food Insecurity: No Food Insecurity (09/09/2021)   Hunger Vital Sign    Worried About Running Out of Food in the Last Year: Never true    Ran Out of Food in the Last Year: Never true  Transportation Needs: No Transportation Needs (09/09/2021)   PRAPARE - Administrator, Civil Service (Medical): No    Lack of Transportation (Non-Medical): No  Physical Activity: Insufficiently Active (09/09/2021)   Exercise Vital Sign    Days of Exercise per Week: 3 days    Minutes of Exercise per Session: 30 min  Stress: No Stress Concern Present (09/09/2021)   [employer] of Occupational Health - Occupational Stress Questionnaire     Feeling of Stress : Not at all  Social Connections: Unknown (09/09/2021)   Social Connection and Isolation Panel [NHANES]    Frequency of Communication with Friends and Family: More than three times a week    Frequency of Social Gatherings with Friends and Family: More than three times a week    Attends Religious Services: More than 4 times per year    Active Member of [employer] or Organizations: No    Attends Engineer, structural: Never    Marital Status: Not on file   Family History  Problem Relation Age of Onset   Alcohol abuse Mother        possible she had stomach cancer as well but unsure   Hypertension Father    Alcohol abuse Father    Pancreatic cancer Father    Cancer Brother        spindle cell RLE   Diabetes Brother    Cancer Brother        tonsil cancer   Skin cancer Brother    Colon cancer Neg Hx    Colon polyps Neg Hx    Stomach cancer Neg Hx    Allergies  Allergen Reactions   Reglan [Metoclopramide] Other (See Comments)    Suicidal thoughts   Ciprofloxacin Diarrhea   Clindamycin/Lincomycin Diarrhea   Doxycycline Diarrhea and Nausea And Vomiting   Macrobid [Nitrofurantoin Monohyd Macro] Rash    Rash on lip   Sulfa Antibiotics Rash   Current Outpatient Medications  Medication Sig Dispense Refill   methocarbamol (ROBAXIN) 500 MG tablet Take 1 tablet (500 mg total) by mouth 4 (four) times daily. (Patient not taking: Reported on 05/14/2022) 30 tablet 0   methocarbamol (ROBAXIN) 500 MG tablet Take 1 tablet (500 mg total) by mouth 4 (four) times daily. 30 tablet 0   acetaminophen (TYLENOL) 500 MG tablet Take 1,000 mg by mouth every 6 (six) hours as needed for mild pain, moderate pain, fever or headache.     buPROPion (WELLBUTRIN XL) 150 MG 24 hr tablet Take 300 mg by mouth daily.     cephALEXin (KEFLEX) 250 MG capsule Take 250 mg by mouth daily.     Cholecalciferol (VITAMIN D) 125 MCG (5000 UT) CAPS Take 5,000 Units by mouth daily.     Coenzyme Q10 200 MG  TABS Take 200 mg by mouth daily.     Cyanocobalamin (B-12 PO) Take 1 tablet by mouth daily.     cyclobenzaprine (FLEXERIL) 10 MG tablet Take 1 tablet (  10 mg total) by mouth 3 (three) times daily as needed for muscle spasms. 30 tablet 0   EMGALITY 120 MG/ML SOAJ INJECT  240 MG SUBCUTANEOUSLY ONCE FOR 1 DOSE LOADING DOSE (Patient not taking: Reported on 05/14/2022) 2 mL 0   flecainide (TAMBOCOR) 100 MG tablet Take 1 tablet (100 mg total) by mouth 2 (two) times daily. 180 tablet 3   Galcanezumab-gnlm (EMGALITY) 120 MG/ML SOAJ Inject 120 mg into the skin every 30 (thirty) days. 1.12 mL 5   Magnesium Oxide 420 MG TABS Take 420 mg by mouth daily.     melatonin 5 MG TABS Take 5 mg by mouth at bedtime.     Multiple Vitamin (MULTIVITAMIN WITH MINERALS) TABS tablet Take 1 tablet by mouth daily.     ondansetron (ZOFRAN ODT) 4 MG disintegrating tablet Take 1 tablet (4 mg total) by mouth every 8 (eight) hours as needed for nausea or vomiting. 20 tablet 0   oxyCODONE (OXY IR/ROXICODONE) 5 MG immediate release tablet Take 1 tablet (5 mg total) by mouth every 4 (four) hours as needed (severe pain). (Patient not taking: Reported on 05/14/2022) 20 tablet 0   oxyCODONE (OXY IR/ROXICODONE) 5 MG immediate release tablet Take 1 tablet (5 mg total) by mouth every 4 (four) hours as needed (severe pain). 15 tablet 0   pantoprazole (PROTONIX) 20 MG tablet TAKE 1 TABLET BY MOUTH TWICE DAILY BEFORE A MEAL. 180 tablet 0   pyridOXINE (VITAMIN B6) 100 MG tablet Take 100 mg by mouth daily.     rivaroxaban (XARELTO) 20 MG TABS tablet Take 1 tablet (20 mg total) by mouth daily with supper. 90 tablet 3   rosuvastatin (CRESTOR) 20 MG tablet Take 1 tablet by mouth once daily 90 tablet 0   sertraline (ZOLOFT) 50 MG tablet Take 1 tablet (50 mg total) by mouth daily. (Patient taking differently: Take 50 mg by mouth in the morning and at bedtime.) 30 tablet 6   SUMAtriptan (IMITREX) 100 MG tablet TAKE 1 TABLET BY MOUTH AS NEEDED FOR UP TO 1  DOSE FOR MIGRAINE 10 tablet 0   traZODone (DESYREL) 50 MG tablet Take 0.5-1 tablets (25-50 mg total) by mouth at bedtime as needed for sleep. 30 tablet 5   No current facility-administered medications for this visit.   No results found.  Review of Systems:   A ROS was performed including pertinent positives and negatives as documented in the HPI.   Musculoskeletal Exam:    There were no vitals taken for this visit.  Right hip is well-appearing.  No erythema or drainage.  Active flexion about the right hip is to 120 degrees while in the seated position.  Internal and external rotation is to 30 degrees.  She is walking with a cane.  Imaging:    none  I personally reviewed and interpreted the radiographs.   Assessment:   71 year old female who is 6 weeks status post right hip gluteus medius repair overall doing very well.  At this time I would like her to progress according to the rehab protocol.  I have described all the medications and precautions.  I will see her back in 6 weeks for reassessment  Plan :    -Return to clinic in 6 weeks      I personally saw and evaluated the patient, and participated in the management and treatment plan.  Vanetta Mulders, MD Attending Physician, Orthopedic Surgery  This document was dictated using Dragon voice recognition software. A reasonable attempt at proof  reading has been made to minimize errors.

## 2022-06-30 ENCOUNTER — Other Ambulatory Visit: Payer: Self-pay | Admitting: Family Medicine

## 2022-07-01 ENCOUNTER — Ambulatory Visit (HOSPITAL_BASED_OUTPATIENT_CLINIC_OR_DEPARTMENT_OTHER): Payer: Medicare HMO | Admitting: Physical Therapy

## 2022-07-06 NOTE — Therapy (Signed)
OUTPATIENT PHYSICAL THERAPY LOWER EXTREMITY Treatment    Patient Name: Danielle Rocha MRN: [id number] DOB:05-Mar-1951, 71 y.o., female Today's Date: 07/08/2022   PT End of Session - 07/07/22 1410     Visit Number 7    Number of Visits 28    Date for PT Re-Evaluation 08/15/22    Authorization Type AETNA MCR    PT Start Time 1353    PT Stop Time 1431    PT Time Calculation (min) 38 min    Activity Tolerance Patient tolerated treatment well    Behavior During Therapy WFL for tasks assessed/performed                  Past Medical History:  Diagnosis Date   Anemia    Arthritis    DJD, low back, thumb   Atrial fibrillation (HCC)    Xarelto anticoagulation. Flecainide antiarrythmic    Chicken pox    Chronic pain syndrome    On disability. History of bilateral hip pain, low back pain. Gabapentin 400 BID, percocet 1 tablet daily per prior provider.    Colon polyp    awaiting records   Depression    zoloft 100mg , remeron 30mg  per psychiatry. ambien 10mg  per psychiatry to help wtih sleep element.    Dysrhythmia    Dystonia    described as psychogenic dystonia. Pain and twisting from upper chest and up with triggers "wind, creamy food" on TID ativan per psychiatry previously.    Fibromyalgia    GERD (gastroesophageal reflux disease)    omeprazole OTC   Goiter    states multiple imaging tests, has had biopsies   Hyperlipidemia    lovastatin 20mg    Stroke (HCC)    TIA (left side of face and body decreased sensitivity than right face and side)   TIA (transient ischemic attack)    Vaginal atrophy    estrace vaginal cream   Past Surgical History:  Procedure Laterality Date   ATRIAL FIBRILLATION ABLATION     BIOPSY THYROID     fusion l4-l5  09/22/1998   GLUTEUS MINIMUS REPAIR Right 05/19/2022   Procedure: RIGHT HIP GLUTEUS MEDIUS TENDON REPAIR;  Surgeon: Huel Cote, MD;  Location: [facility] OR;  Service: Orthopedics;  Laterality: Right;   LAMINECTOMY     L4-L5   OTHER  SURGICAL HISTORY     tennis elbow surgery   piriformis release  09/22/1997   right hip   TONSILLECTOMY AND ADENOIDECTOMY  age 59   VAGINAL HYSTERECTOMY  09/23/1991   Patient Active Problem List   Diagnosis Date Noted   Tear of right gluteus medius tendon    Sore in nose 07/30/2021   Involuntary movements 02/14/2020   Depression with anxiety 02/14/2020   Aortic atherosclerosis (HCC) 02/07/2020   Delayed gastric emptying 02/07/2020   Dystonia    Restless legs 07/27/2018   Recurrent UTI 10/07/2016   Major depression in full remission (HCC) 09/02/2016   GERD (gastroesophageal reflux disease) 01/30/2016   History of SI/intentional drug overdose 11/22/2015   Atrial fibrillation (HCC)    Chronic pain syndrome    Hyperlipidemia     REFERRING PROVIDER: Huel Cote, MD  REFERRING DIAG: S76.011A (ICD-10-CM) - Tear of right gluteus medius tendon, initial encounter   THERAPY DIAG:  Stiffness of right hip, not elsewhere classified  Muscle weakness (generalized)  Difficulty in walking, not elsewhere classified  Pain in right hip  Rationale for Evaluation and Treatment Rehabilitation  ONSET DATE: DOS 05/19/2022  SUBJECTIVE:   SUBJECTIVE  Pt is 7 weeks s/p Right hip gluteus medius/minimus repair and trochanteric bursectomy.  Pt has been rolling her thigh and states it is better.  Pt has problems adjusting at night and has difficulty sleeping.  Pt states she was pretty sore and had some pain after prior Rx.  Pt is driving without significant pain.  Pt saw MD around 6 weeks and informed her to start aquatic therapy.  Pt is limited with ambulation distance though is walking around house well.  Pt is not using the cane in her home.  Pt states she has been having migraines everyday though didn't wake up with one today.  Pt has had migraines for 6 months and sees a neurologist.      PERTINENT HISTORY:  -R hip gluteus medius/minimus repair and trochanteric bursectomy on 05/19/2022. No  active abduction, IR or passive ER, adduction at least 6 weeks  -L4-5 Fusion in 2000, chronic pain syndrome, Atrial fibrillation, fibromyalgia, Depression, TIA  -R piriformis release in 1999 hip     PAIN:  Are you having pain? Yes NPRS:  Current: 2/10, Worst:  7/10, Best:  2/10     Location:  R lateral hip  Aggravating factors factors: standing walking  Relieving factors: rest   PRECAUTIONS: Other: per surgical protocol.  No active abduction, IR or passive ER, adduction at least 6 weeks.  Wb'ing restrictions.  L4-5 Fusion, A-fib  WEIGHT BEARING RESTRICTIONS Yes    FALLS:  Has patient fallen in last 6 months? Yes. Number of falls 1-2 due to medications  LIVING ENVIRONMENT: Lives with: lives with their spouse and lives with their son Lives in: one story home with an attic Stairs: 5 steps with rail to enter front, 4 steps with rail to enter side, and 4 steps, with rails to enter back  Has following equipment at home: Dan Humphreys - 2 wheeled and rollator and quad cane  OCCUPATION: pt is retired  PLOF: Independent ; Pt ambulated without an AD.  Pt had increased pain with household chores.  She was limited with ambulation and shopping.   PATIENT GOALS pt wants to be able to walk without pain, gardening, and be out with her family.     OBJECTIVE:   DIAGNOSTIC FINDINGS: pt has had prior x rays and MRIs    TODAY'S TREATMENT:  PT checked incision.  Incision has closed well and is dry.  She has no signs of incection.  Pt received Passive Flexion, abduction, ER and IR per pt tolerance within protocol ranges  Pt performed  bridge 3x10    Isometric hip flexion per protocol in hooklying with 5 sec hold x 7 and in sitting x 7 reps  Prone HS curl x 10 AROM, 2x10 with 1# LAQ x 10 AROM, 2x10 with 1# Stool rotations 2x10 Standing weight shifts s/s and F/B with UE support   PATIENT EDUCATION:  Education details:  Educated Pt concerning POC and aquatics.  Protocol restrictions and  expectations.  PT instructed pt to not perform hip abduction.  Instructed pt to continue with HEP. Person educated: Patient Education method: Explanation, Demonstration, Verbal cues, and Handouts Education comprehension: verbalized understanding, returned demonstration, verbal cues required, and needs further education   HOME EXERCISE PROGRAM: Access Code: [email or web address] URL: [email or web address] Date: 05/23/2022 Prepared by: Aaron Edelman  Exercises - ANKLE PUMPS  - 3 x daily - 7 x weekly - 3 sets - 10 reps Updated HEP: - Supine Quadricep Sets  - 2 x daily - 7 x weekly -  2 sets - 10 reps - 5 seconds hold - Supine Transversus Abdominis Bracing - Hands on Stomach  - 2 x daily - 7 x weekly - 2 sets - 10 reps - 5 seconds hold  ASSESSMENT:  CLINICAL IMPRESSION: Pt reports she is ambulating without AD in home and ambulates well.  Pt reports MD informing her to begin aquatic therapy.  PT viewed her incision which is intact, closed, and dry and had no signs of infection.  PT progressed exercises and ROM per protocol and pt tolerated progression well.  She performed exercises well with cuing for correct form.  Pt had improved flexion PROM as based on visual observation though did have some irritation with hip flexion isometric.  Pt responded well to Rx and reported increased pain from  2/10 to before Rx to 3-3.5/10 after Rx.  Pt should benefit form cont skilled PT services per protocol to address impairments and goals and to improve function.     OBJECTIVE IMPAIRMENTS Abnormal gait, decreased activity tolerance, decreased balance, decreased endurance, decreased mobility, difficulty walking, decreased ROM, decreased strength, hypomobility, impaired flexibility, and pain.   ACTIVITY LIMITATIONS lifting, bending, standing, squatting, stairs, transfers, bed mobility, dressing, and locomotion level  PARTICIPATION LIMITATIONS: cleaning, laundry, driving, shopping, community activity, and  gardening  PERSONAL FACTORS Time since onset of injury/illness/exacerbation and 3+ comorbidities: L4-5 Fusion in 2000, chronic pain syndrome, Atrial fibrillation, fibromyalgia, Depression   are also affecting patient's functional outcome.   REHAB POTENTIAL: Good  CLINICAL DECISION MAKING: Stable/uncomplicated  EVALUATION COMPLEXITY: Low   GOALS:  SHORT TERM GOALS: Target date: 06/20/2022  Pt will be independent and compliant with HEP for improved pain, strength, and function Baseline: Goal status: INITIAL  2.  Pt will progress with exercises per protocol without adverse effects to improve strength and mobility.   Baseline:  Goal status: INITIAL Target Date:  07/04/2022   3.  Pt will progress with PROM per protocol without adverse effects for improved stiffness and mobility.  Baseline:  Goal status: INITIAL Target date:  07/18/2022  4.  Pt will progress Wb'ing and appropriately wean AD per MD orders or protocol. Baseline:  Goal status: INITIAL Target date:  07/04/2022  5.  Pt will be able to perform a 6 inch step up with good form and control with L LE leading for improved performance of stairs abd and improved functional strength. Baseline:  Goal status: INITIAL Target date:  08/15/2022   6.  Pt will report improved tolerance with standing and performing household chores.  Baseline:  Goal status: INITIAL Target date:  08/15/2022  LONG TERM GOALS: Target date: 09/11/2022   Pt will ambulate with a normalized heel to toe gait without limping Baseline:  Goal status: INITIAL  2.  Pt will ambulate extended community distance without an AD without significant pain and difficulty.  Baseline:  Goal status: INITIAL  3.  Pt will be able to perform her normal standing activities and household chores without significant pain.  Baseline:  Goal status: INITIAL  4.    Pt will demo at least 4/5 hip flexion, 4-/5 hip abd, and 5/5 knee extension strength for improved  performance of and tolerance with functional mobility.  Baseline:  Goal status: INITIAL  5.  Pt will be able to perform stairs with a reciprocal gait with a rail with good control.  Baseline:  Goal status: INITIAL     PLAN: PT FREQUENCY:  1x/wk x 4 week and 2x/wk afterwards  PT DURATION: other: 16  weeks  PLANNED INTERVENTIONS: Therapeutic exercises, Therapeutic activity, Neuromuscular re-education, Balance training, Gait training, Patient/Family education, Self Care, Joint mobilization, Stair training, DME instructions, Aquatic Therapy, Dry Needling, Cryotherapy, Moist heat, scar mobilization, Taping, Ultrasound, Manual therapy, and Re-evaluation  PLAN FOR NEXT SESSION: Cont per Dr. Serena Croissant gluteus medius repair protocol.  Post op plan on op note indicated: 50% weight bearing on the right leg for 2 weeks then full weight bearing.  No active abduction, IR or passive ER, adduction at least 6 weeks  Pt to begin aquatic therapy.  Will plan to continue 1 aquatic and 1 land based PT each week.   Audie Clear III PT, DPT 07/08/22 1:22 PM

## 2022-07-07 ENCOUNTER — Encounter (HOSPITAL_BASED_OUTPATIENT_CLINIC_OR_DEPARTMENT_OTHER): Payer: Self-pay | Admitting: Physical Therapy

## 2022-07-07 ENCOUNTER — Ambulatory Visit (HOSPITAL_BASED_OUTPATIENT_CLINIC_OR_DEPARTMENT_OTHER): Payer: Medicare HMO | Admitting: Physical Therapy

## 2022-07-07 DIAGNOSIS — M6281 Muscle weakness (generalized): Secondary | ICD-10-CM | POA: Diagnosis not present

## 2022-07-07 DIAGNOSIS — M25651 Stiffness of right hip, not elsewhere classified: Secondary | ICD-10-CM | POA: Diagnosis not present

## 2022-07-07 DIAGNOSIS — R262 Difficulty in walking, not elsewhere classified: Secondary | ICD-10-CM | POA: Diagnosis not present

## 2022-07-07 DIAGNOSIS — M25551 Pain in right hip: Secondary | ICD-10-CM | POA: Diagnosis not present

## 2022-07-09 DIAGNOSIS — F3341 Major depressive disorder, recurrent, in partial remission: Secondary | ICD-10-CM | POA: Diagnosis not present

## 2022-07-09 DIAGNOSIS — R69 Illness, unspecified: Secondary | ICD-10-CM | POA: Diagnosis not present

## 2022-07-21 ENCOUNTER — Other Ambulatory Visit: Payer: Self-pay | Admitting: Physician Assistant

## 2022-07-23 ENCOUNTER — Encounter (HOSPITAL_BASED_OUTPATIENT_CLINIC_OR_DEPARTMENT_OTHER): Payer: Self-pay | Admitting: Physical Therapy

## 2022-07-24 ENCOUNTER — Ambulatory Visit (HOSPITAL_BASED_OUTPATIENT_CLINIC_OR_DEPARTMENT_OTHER): Payer: Medicare HMO | Attending: Orthopaedic Surgery | Admitting: Physical Therapy

## 2022-07-24 ENCOUNTER — Other Ambulatory Visit: Payer: Self-pay | Admitting: Physical Medicine and Rehabilitation

## 2022-07-24 ENCOUNTER — Other Ambulatory Visit (HOSPITAL_BASED_OUTPATIENT_CLINIC_OR_DEPARTMENT_OTHER): Payer: Self-pay | Admitting: Orthopaedic Surgery

## 2022-07-24 ENCOUNTER — Encounter (HOSPITAL_BASED_OUTPATIENT_CLINIC_OR_DEPARTMENT_OTHER): Payer: Self-pay | Admitting: Physical Therapy

## 2022-07-24 DIAGNOSIS — M5412 Radiculopathy, cervical region: Secondary | ICD-10-CM

## 2022-07-24 DIAGNOSIS — R262 Difficulty in walking, not elsewhere classified: Secondary | ICD-10-CM | POA: Insufficient documentation

## 2022-07-24 DIAGNOSIS — M25552 Pain in left hip: Secondary | ICD-10-CM | POA: Diagnosis not present

## 2022-07-24 DIAGNOSIS — M6281 Muscle weakness (generalized): Secondary | ICD-10-CM | POA: Diagnosis not present

## 2022-07-24 DIAGNOSIS — M25651 Stiffness of right hip, not elsewhere classified: Secondary | ICD-10-CM | POA: Insufficient documentation

## 2022-07-24 DIAGNOSIS — M25551 Pain in right hip: Secondary | ICD-10-CM | POA: Insufficient documentation

## 2022-07-24 NOTE — Therapy (Signed)
OUTPATIENT PHYSICAL THERAPY LOWER EXTREMITY TREATMENT   Patient Name: Danielle Rocha MRN: [id number] DOB:Jan 11, 1951, 71 y.o., female Today's Date: 07/24/2022   PT End of Session - 07/24/22 1035     Visit Number 8    Number of Visits 28    Date for PT Re-Evaluation 08/15/22    Authorization Type AET[employer] MCR    PT Start Time 1029    PT Stop Time 1111    PT Time Calculation (min) 42 min    Activity Tolerance Patient tolerated treatment well    Behavior During Therapy WFL for tasks assessed/performed                  Past Medical History:  Diagnosis Date   Anemia    Arthritis    DJD, low back, thumb   Atrial fibrillation (HCC)    Xarelto anticoagulation. Flecainide antiarrythmic    Chicken pox    Chronic pain syndrome    On disability. History of bilateral hip pain, low back pain. Gabapentin 400 BID, percocet 1 tablet daily per prior provider.    Colon polyp    awaiting records   Depression    zoloft 187m, remeron 354mper psychiatry. ambien 1035mer psychiatry to help wtih sleep element.    Dysrhythmia    Dystonia    described as psychogenic dystonia. Pain and twisting from upper chest and up with triggers "wind, creamy food" on TID ativan per psychiatry previously.    Fibromyalgia    GERD (gastroesophageal reflux disease)    omeprazole OTC   Goiter    states multiple imaging tests, has had biopsies   Hyperlipidemia    lovastatin 66m68mStroke (HCC)    TIA (left side of face and body decreased sensitivity than right face and side)   TIA (transient ischemic attack)    Vaginal atrophy    estrace vaginal cream   Past Surgical History:  Procedure Laterality Date   ATRIAL FIBRILLATION ABLATION     BIOPSY THYROID     fusion l4-l5  09/22/1998   GLUTEUS MINIMUS REPAIR Right 05/19/2022   Procedure: RIGHT HIP GLUTEUS MEDIUS TENDON REPAIR;  Surgeon: BoksVanetta Mulders;  [facility];  Laterality: Right;   LAMINECTOMY     L4-L5   OTHER  SURGICAL HISTORY     tennis elbow surgery   piriformis release  09/22/1997   right hip   TONSILLECTOMY AND ADENOIDECTOMY  age 44   39AGINAL HYSTERECTOMY  09/23/1991   Patient Active Problem List   Diagnosis Date Noted   Tear of right gluteus medius tendon    Sore in nose 07/30/2021   Involuntary movements 02/14/2020   Depression with anxiety 02/14/2020   Aortic atherosclerosis (HCC)[place]/18/2021   Delayed gastric emptying 02/07/2020   Dystonia    Restless legs 07/27/2018   Recurrent UTI 10/07/2016   Major depression in full remission (HCC)[place]/08/2016   GERD (gastroesophageal reflux disease) 01/30/2016   History of SI/intentional drug overdose 11/22/2015   Atrial fibrillation (HCC)    Chronic pain syndrome    Hyperlipidemia     REFERRING PROVIDER: BoksVanetta Mulders  REFERRING DIAG: S76.011A (ICD-10-CM) - Tear of right gluteus medius tendon, initial encounter   THERAPY DIAG:  Stiffness of right hip, not elsewhere classified  Muscle weakness (generalized)  Difficulty in walking, not elsewhere classified  Pain in right hip  Rationale for Evaluation and Treatment Rehabilitation  ONSET DATE: DOS 05/19/2022  SUBJECTIVE:   SUBJECTIVE  Pt is not using the cane in her home, but using it in community. She was away last week at the beach.  She reports she started to use reciprocal pattern going up/down steps at end of vacation. " I haven't done my mile walk yet".       PERTINENT HISTORY:  -R hip gluteus medius/minimus repair and trochanteric bursectomy on 05/19/2022. No active abduction, IR or passive ER, adduction at least 6 weeks  -L4-5 Fusion in 2000, chronic pain syndrome, Atrial fibrillation, fibromyalgia, Depression, TIA  -R piriformis release in 1999 hip     PAIN:  Are you having pain? Yes NPRS:  1/10 at rest; 3/10 with movement   Location:  R lateral hip  Aggravating factors factors: standing walking  Relieving factors: rest   PRECAUTIONS: Other: per surgical  protocol.  No active abduction, IR or passive ER, adduction at least 6 weeks.  Wb'ing restrictions.  L4-5 Fusion, A-fib  WEIGHT BEARING RESTRICTIONS Yes    FALLS:  Has patient fallen in last 6 months? Yes. Number of falls 1-2 due to medications  LIVING ENVIRONMENT: Lives with: lives with their spouse and lives with their son Lives in: one story home with an attic Stairs: 5 steps with rail to enter front, 4 steps with rail to enter side, and 4 steps, with rails to enter back  Has following equipment at home: Gilford Rile - 2 wheeled and rollator and quad cane  OCCUPATION: pt is retired  PLOF: Independent ; Pt ambulated without an AD.  Pt had increased pain with household chores.  She was limited with ambulation and shopping.   PATIENT GOALS pt wants to be able to walk without pain, gardening, and be out with her family.     OBJECTIVE:   DIAGNOSTIC FINDINGS: pt has had prior x rays and MRIs    TODAY'S TREATMENT: Pt seen for aquatic therapy today.  Treatment took place in water 3.25-27f in [facility] Temp of water was 92.  Pt entered/exited the pool via stairs independently with bilat rail.  Reviewed scar mobilization  With light support of white barbell:  forward/ backward walking  Without support: side stepping - varying step length and height; forward walking kicks; backward marching; heel raises x 10 Wall push ups x 10; alternating hamstring curls x 10x 2;  L lumbar stretch holding wall  STS on bench with blue step under feet x 10 Forward step ups on blue step at 360f6" x 10 with core engaged  Return to walking forward/ backwards without support Holding wall:  gentle hip ext x 10 each;  small range single clam x 10 each Return to side stepping varying step length and height  Pt requires the buoyancy and hydrostatic pressure of water for support, and to offload joints by unweighting joint load by at least 50 % in navel deep water and by at least 75-80%  in chest to neck deep water.  Viscosity of the water is needed for resistance of strengthening. Water current perturbations provides challenge to standing balance requiring increased core activation.     PATIENT EDUCATION:  Education details: Review of aquatics exercises / modifications Person educated: Patient Education method: ExConsulting civil engineerDeMedia plannerVerbal cues, and Handouts Education comprehension: verbalized understanding, returned demonstration, verbal cues required, and needs further education   HOME EXERCISE PROGRAM: Access Code: [email or web address] URL: [email or web address] Date: 05/23/2022 Prepared by: TrRonny FlurryExercises - ANKLE PUMPS  - 3 x daily - 7 x weekly - 3  sets - 10 reps Updated HEP: - Supine Quadricep Sets  - 2 x daily - 7 x weekly - 2 sets - 10 reps - 5 seconds hold - Supine Transversus Abdominis Bracing - Hands on Stomach  - 2 x daily - 7 x weekly - 2 sets - 10 reps - 5 seconds hold  ASSESSMENT:  CLINICAL IMPRESSION: Pt is 9 weeks s/p Right hip gluteus medius/minimus repair and trochanteric bursectomy.   Pt reports she prefers to face lap pool when walking forward/ backwards due to slope.  Exercises kept gentle and to tolerance.  She initially requested barbell at beginning of session for improved balance, but was able to go without it pretty quickly without LOB.  Pt is progressing towards all established goals.   Pt should benefit form cont skilled PT services per protocol to address impairments and goals and to improve function.     OBJECTIVE IMPAIRMENTS Abnormal gait, decreased activity tolerance, decreased balance, decreased endurance, decreased mobility, difficulty walking, decreased ROM, decreased strength, hypomobility, impaired flexibility, and pain.   ACTIVITY LIMITATIONS lifting, bending, standing, squatting, stairs, transfers, bed mobility, dressing, and locomotion level  PARTICIPATION LIMITATIONS: cleaning, laundry, driving, shopping,  community activity, and gardening  PERSONAL FACTORS Time since onset of injury/illness/exacerbation and 3+ comorbidities: L4-5 Fusion in 2000, chronic pain syndrome, Atrial fibrillation, fibromyalgia, Depression   are also affecting patient's functional outcome.   REHAB POTENTIAL: Good  CLINICAL DECISION MAKING: Stable/uncomplicated  EVALUATION COMPLEXITY: Low   GOALS:  SHORT TERM GOALS: Target date: 06/20/2022  Pt will be independent and compliant with HEP for improved pain, strength, and function Baseline: Goal status: Ongoing   2.  Pt will progress with exercises per protocol without adverse effects to improve strength and mobility.   Baseline:  Goal status: Ongoing  Target Date:  07/04/2022   3.  Pt will progress with PROM per protocol without adverse effects for improved stiffness and mobility.  Baseline:  Goal status: MET -07/24/22 per pt report  Target date:  07/18/2022  4.  Pt will progress Wb'ing and appropriately wean AD per MD orders or protocol. Baseline: currently without AD in house, [facility] in community - 07/24/22 Goal status:Ongoing Target date:  07/04/2022  5.  Pt will be able to perform a 6 inch step up with good form and control with L LE leading for improved performance of stairs abd and improved functional strength. Baseline: has completed this in the water without difficulty  Goal status: Partially Met - 07/24/22 Target date:  08/15/2022   6.  Pt will report improved tolerance with standing and performing household chores.  Baseline:  Goal status: INITIAL Target date:  08/15/2022  LONG TERM GOALS: Target date: 09/11/2022   Pt will ambulate with a normalized heel to toe gait without limping Baseline:  Goal status: INITIAL  2.  Pt will ambulate extended community distance without an AD without significant pain and difficulty.  Baseline:  Goal status: INITIAL  3.  Pt will be able to perform her normal standing activities and household chores without  significant pain.  Baseline:  Goal status: INITIAL  4.    Pt will demo at least 4/5 hip flexion, 4-/5 hip abd, and 5/5 knee extension strength for improved performance of and tolerance with functional mobility.  Baseline:  Goal status: INITIAL  5.  Pt will be able to perform stairs with a reciprocal gait with a rail with good control.  Baseline:  Goal status: Ongoing      PLAN: PT  FREQUENCY:  1x/wk x 4 week and 2x/wk afterwards  PT DURATION: other: 16 weeks  PLANNED INTERVENTIONS: Therapeutic exercises, Therapeutic activity, Neuromuscular re-education, Balance training, Gait training, Patient/Family education, Self Care, Joint mobilization, Stair training, DME instructions, Aquatic Therapy, Dry Needling, Cryotherapy, Moist heat, scar mobilization, Taping, Ultrasound, Manual therapy, and Re-evaluation  PLAN FOR NEXT SESSION: Cont per Dr. Eddie Dibbles gluteus medius repair protocol.    Pt to begin aquatic therapy.  Will plan to continue 1 aquatic and 1 land based PT each week.   Kerin Perna, PTA 07/24/22 12:45 PM [facility] [address] [place], [place], [address] Phone: [phone]   Fax:  [phone]

## 2022-07-28 ENCOUNTER — Ambulatory Visit: Payer: Medicare HMO | Admitting: Physician Assistant

## 2022-07-28 VITALS — BP 124/73 | HR 71 | Resp 18 | Ht 68.0 in | Wt 156.0 lb

## 2022-07-28 DIAGNOSIS — R69 Illness, unspecified: Secondary | ICD-10-CM | POA: Diagnosis not present

## 2022-07-28 DIAGNOSIS — F09 Unspecified mental disorder due to known physiological condition: Secondary | ICD-10-CM | POA: Diagnosis not present

## 2022-07-28 DIAGNOSIS — Z8669 Personal history of other diseases of the nervous system and sense organs: Secondary | ICD-10-CM | POA: Diagnosis not present

## 2022-07-28 MED ORDER — AIMOVIG 140 MG/ML ~~LOC~~ SOAJ: 140.0000 mg | SUBCUTANEOUS | 11 refills | Status: DC

## 2022-07-28 NOTE — Patient Instructions (Addendum)
It was a pleasure to see you today at our office.   Recommendations:  Follow up in  6 months Start Aimovig 140 mg injection q 28 days, discontinue Emgality. If BP elevates too high, will decrease the dose.   RECOMMENDATIONS FOR ALL PATIENTS WITH MEMORY PROBLEMS: 1. Continue to exercise (Recommend 30 minutes of walking everyday, or 3 hours every week) 2. Increase social interactions - continue going to [place] and enjoy social gatherings with friends and family 3. Eat healthy, avoid fried foods and eat more fruits and vegetables 4. Maintain adequate blood pressure, blood sugar, and blood cholesterol level. Reducing the risk of stroke and cardiovascular disease also helps promoting better memory. 5. Avoid stressful situations. Live a simple life and avoid aggravations. Organize your time and prepare for the next day in anticipation. 6. Sleep well, avoid any interruptions of sleep and avoid any distractions in the bedroom that may interfere with adequate sleep quality 7. Avoid sugar, avoid sweets as there is a strong link between excessive sugar intake, diabetes, and cognitive impairment We discussed the Mediterranean diet, which has been shown to help patients reduce the risk of progressive memory disorders and reduces cardiovascular risk. This includes eating fish, eat fruits and green leafy vegetables, nuts like almonds and hazelnuts, walnuts, and also use olive oil. Avoid fast foods and fried foods as much as possible. Avoid sweets and sugar as sugar use has been linked to worsening of memory function.  There is always a concern of gradual progression of memory problems. If this is the case, then we may need to adjust level of care according to patient needs. Support, both to the patient and caregiver, should then be put into place.    FALL PRECAUTIONS: Be cautious when walking. Scan the area for obstacles that may increase the risk of trips and falls. When getting up in the mornings, sit up at  the edge of the bed for a few minutes before getting out of bed. Consider elevating the bed at the head end to avoid drop of blood pressure when getting up. Walk always in a well-lit room (use night lights in the walls). Avoid area rugs or power cords from appliances in the middle of the walkways. Use a walker or a cane if necessary and consider physical therapy for balance exercise. Get your eyesight checked regularly.  FINANCIAL OVERSIGHT: Supervision, especially oversight when making financial decisions or transactions is also recommended.  HOME SAFETY: Consider the safety of the kitchen when operating appliances like stoves, microwave oven, and blender. Consider having supervision and share cooking responsibilities until no longer able to participate in those. Accidents with firearms and other hazards in the house should be identified and addressed as well.   ABILITY TO BE LEFT ALONE: If patient is unable to contact 911 operator, consider using LifeLine, or when the need is there, arrange for someone to stay with patients. Smoking is a fire hazard, consider supervision or cessation. Risk of wandering should be assessed by caregiver and if detected at any point, supervision and safe proof recommendations should be instituted.  MEDICATION SUPERVISION: Inability to self-administer medication needs to be constantly addressed. Implement a mechanism to ensure safe administration of the medications.   DRIVING: Regarding driving, in patients with progressive memory problems, driving will be impaired. We advise to have someone else do the driving if trouble finding directions or if minor accidents are reported. Independent driving assessment is available to determine safety of driving.   If you are interested  in the driving assessment, you can contact the following:  [employer] in [place]  [place]  [facility] [phone] or [phone]  Limit use of pain relievers to no more than 2 days out of the week.  These medications include acetaminophen, NSAIDs (ibuprofen/Advil/Motrin, naproxen/Aleve, triptans (Imitrex/sumatriptan), Excedrin, and narcotics.  This will help reduce risk of rebound headaches. Be aware of common food triggers:  - Caffeine:  coffee, black tea, cola, Mt. Dew  - Chocolate  - Dairy:  aged cheeses (brie, blue, cheddar, gouda, [place], provolone, [place], Swiss, etc), chocolate milk, buttermilk, sour cream, limit eggs and yogurt  - Nuts, peanut butter  - Alcohol  - Cereals/grains:  FRESH breads (fresh bagels, sourdough, doughnuts), yeast productions  - Processed/canned/aged/cured meats (pre-packaged deli meats, hotdogs)  - MSG/glutamate:  soy sauce, flavor enhancer, pickled/preserved/marinated foods  - Sweeteners:  aspartame (Equal, Nutrasweet).  Sugar and Splenda are okay  - Vegetables:  legumes (lima beans, lentils, snow peas, fava beans, pinto peans, peas, garbanzo beans), sauerkraut, onions, olives, pickles  - Fruit:  avocados, bananas, citrus fruit (orange, lemon, grapefruit), mango  - Other:  Frozen meals, macaroni and cheese Routine exercise Stay adequately hydrated (aim for 64 oz water daily) Keep headache diary Maintain proper stress management Maintain proper sleep hygiene Do not skip meals Consider supplements:  magnesium citrate 400mg  daily, riboflavin 400mg  daily, coenzyme Q10 100mg  three times daily.

## 2022-07-28 NOTE — Progress Notes (Signed)
Assessment/Plan:   Memory Difficulties   Danielle Rocha is a very pleasant 71 y.o. RH female with a history of atrial fibrillation on anticoagulation, fibromyalgia, history of TIA psychogenic dystonia,  chronic pain syndrome, hyperlipidemia, depression, restless leg syndrome, anxiety, thyroid nodule, migraine headaches  presenting today in follow-up for evaluation of memory loss. Personally reviewed  MRI brain wo contrast 06/06/21  without acute abnormalities in the brainstem or cerebellum  Patient is not on anti-inflammatory medications.  Since her last visit, her memory is reported improved, she feels better after monitor carefully with psychiatry and after discontinuing the use of some medications including Neurontin.  She denies any new cognitive issues.    Recommendations:   Follow up as needed regarding memory Continue to control mood as per Psychiatry, on sertraline 50 mg qhs, bupropion 150 mg daily    Migraine headaches  Patient has a prior history of migraine headaches, initially treated with Imitrex with some help.  Headaches have not resolved with the medication.  She is on Tylenol.  She has a frequency between 15 and 20 a month, they are very severe.  She has photophobia and nausea with these events.  Patient has tried Emgality 120 mg subcu every 30 days for prevention, which has not been therapeutic.  She has a history of hypotension, so beta-blockers cannot be used.  She is also on Imitrex 100 mg as needed for migraine, which she reports may not be useful either.  Of note, the patient, takes Tylenol on a daily basis, and she has been instructed to reduce the use of Tylenol to 2 times a week. Start Aimovig 140 mg injection q 28 days, discontinue Emgality. If BP elevates too high, will decrease the dose. Continue taking CoQ, magnesium as supplements. Rest of recommendations regarding migraine, were given to her in AVS. We will discuss with our headache specialist regarding other  therapeutic options for improvement of her headaches.  Case discussed with Dr. Tomi Likens who agrees with the plan   Subjective:   This patient is here with her son.  Previous records as well as any outside records available were reviewed prior to todays visit. Last seen on 01/24/2022    Any changes in memory since last visit?  "Better . I am back to my baseline ".  After discontinuing gabapentin, and reducing bupropion and trazodone, I am laughing more than I had in years and years.  Family notices a dramatic change. repeats oneself?  Is denies Disoriented when walking into a room?  Patient denies   Leaving objects in unusual places?  Patient denies   Ambulates  with difficulty?    s/p R hip gluteus medium tendon repair 04/2022 . Uses a cane to ambulate, on PT/OT, uses a cane to help her ambulate Recent falls?  Patient denies   Any head injuries?  Patient denies   History of seizures?   Patient denies   Wandering behavior?  Patient denies   Patient drives?   No issues  Any mood changes since last visit?  Patient denies   Any worsening depression?:  Patient denies   Hallucinations?  Patient denies   Paranoia?  Patient denies   Patient reports that sleeps well without vivid dreams, REM behavior or sleepwalking. Uses trazodone.    History of sleep apnea?  Patient denies   Any hygiene concerns?  Patient denies   Independent of bathing and dressing?  Endorsed  Does the patient needs help with medications? Patient  In charge  Who  is in charge of the finances? Patient  is in charge    Any changes in appetite?  The last 2 to 3 weeks, she may have some "stomach pain, and this is being evaluated as an outpatient".  Patient have trouble swallowing? Patient denies   Does the patient cook?  Patient denies   Any kitchen accidents such as leaving the stove on? Patient denies   Any headaches?  Patient has a history of headaches, especially over the last 9 months, she has to find a quiet area in the dark,  without sounds and has to put sunglasses to rest.  During these events, she has some aura with tunnel vision with flashes.  The frequency is about 15-20 headaches a month despite having started Emgality 120 mg every 30 days in September of this year.  She does admit to taking Tylenol on a daily basis.  She denies taking any significant amount of caffeine, currently drinking tea.  Of note, she has been evaluated for chronic neck pain due to C3 and C4 as well as C6-C7 DJD, which is being followed by orthopedics. Double vision? Patient denies   Any focal numbness or tingling?  Patient denies   Chronic back pain Patient denies   Unilateral weakness?  Patient denies   Any tremors?  History of functional dystonia but this has improved significantly Any history of anosmia?  Patient denies   Any incontinence of urine?  Patient denies   Any bowel dysfunction?   Chronic constipation   Patient lives with spouse and her son  Past Medical History:  Diagnosis Date   Anemia    Arthritis    DJD, low back, thumb   Atrial fibrillation ([place])    Xarelto anticoagulation. Flecainide antiarrythmic    Chicken pox    Chronic pain syndrome    On disability. History of bilateral hip pain, low back pain. Gabapentin 400 BID, percocet 1 tablet daily per prior provider.    Colon polyp    awaiting records   Depression    zoloft 100mg , remeron 30mg  per psychiatry. ambien 10mg  per psychiatry to help wtih sleep element.    Dysrhythmia    Dystonia    described as psychogenic dystonia. Pain and twisting from upper chest and up with triggers "wind, creamy food" on TID ativan per psychiatry previously.    Fibromyalgia    GERD (gastroesophageal reflux disease)    omeprazole OTC   Goiter    states multiple imaging tests, has had biopsies   Hyperlipidemia    lovastatin 20mg    Stroke (HCC)    TIA (left side of face and body decreased sensitivity than right face and side)   TIA (transient ischemic attack)    Vaginal  atrophy    estrace vaginal cream     Past Surgical History:  Procedure Laterality Date   ATRIAL FIBRILLATION ABLATION     BIOPSY THYROID     fusion l4-l5  09/22/1998   GLUTEUS MINIMUS REPAIR Right 05/19/2022   Procedure: RIGHT HIP GLUTEUS MEDIUS TENDON REPAIR;  Surgeon: Vanetta Mulders, MD;  Location: [facility];  Service: Orthopedics;  Laterality: Right;   LAMINECTOMY     L4-L5   OTHER SURGICAL HISTORY     tennis elbow surgery   piriformis release  09/22/1997   right hip   TONSILLECTOMY AND ADENOIDECTOMY  age 30   VAGINAL HYSTERECTOMY  09/23/1991     PREVIOUS MEDICATIONS:   CURRENT MEDICATIONS:  Outpatient Encounter Medications as of 07/28/2022  Medication Sig  methocarbamol (ROBAXIN) 500 MG tablet Take 1 tablet (500 mg total) by mouth 4 (four) times daily. (Patient not taking: Reported on 05/14/2022)   methocarbamol (ROBAXIN) 500 MG tablet Take 1 tablet (500 mg total) by mouth 4 (four) times daily.   acetaminophen (TYLENOL) 500 MG tablet Take 1,000 mg by mouth every 6 (six) hours as needed for mild pain, moderate pain, fever or headache.   buPROPion (WELLBUTRIN XL) 150 MG 24 hr tablet Take 300 mg by mouth daily.   cephALEXin (KEFLEX) 250 MG capsule Take 250 mg by mouth daily.   Cholecalciferol (VITAMIN D) 125 MCG (5000 UT) CAPS Take 5,000 Units by mouth daily.   Coenzyme Q10 200 MG TABS Take 200 mg by mouth daily.   Cyanocobalamin (B-12 PO) Take 1 tablet by mouth daily.   cyclobenzaprine (FLEXERIL) 10 MG tablet Take 1 tablet (10 mg total) by mouth 3 (three) times daily as needed for muscle spasms.   EMGALITY 120 MG/ML SOAJ INJECT  240 MG SUBCUTANEOUSLY ONCE FOR 1 DOSE LOADING DOSE (Patient not taking: Reported on 05/14/2022)   EMGALITY 120 MG/ML SOAJ INJECT 120 MG INTO SKIN EVERY 30 DAYS   flecainide (TAMBOCOR) 100 MG tablet Take 1 tablet (100 mg total) by mouth 2 (two) times daily.   Magnesium Oxide 420 MG TABS Take 420 mg by mouth daily.   melatonin 5 MG TABS Take 5 mg by mouth at  bedtime.   Multiple Vitamin (MULTIVITAMIN WITH MINERALS) TABS tablet Take 1 tablet by mouth daily.   ondansetron (ZOFRAN ODT) 4 MG disintegrating tablet Take 1 tablet (4 mg total) by mouth every 8 (eight) hours as needed for nausea or vomiting.   oxyCODONE (OXY IR/ROXICODONE) 5 MG immediate release tablet Take 1 tablet (5 mg total) by mouth every 4 (four) hours as needed (severe pain). (Patient not taking: Reported on 05/14/2022)   oxyCODONE (OXY IR/ROXICODONE) 5 MG immediate release tablet Take 1 tablet (5 mg total) by mouth every 4 (four) hours as needed (severe pain).   pantoprazole (PROTONIX) 20 MG tablet TAKE 1 TABLET BY MOUTH TWICE DAILY BEFORE A MEAL.   pyridOXINE (VITAMIN B6) 100 MG tablet Take 100 mg by mouth daily.   rosuvastatin (CRESTOR) 20 MG tablet Take 1 tablet by mouth once daily   sertraline (ZOLOFT) 50 MG tablet Take 1 tablet (50 mg total) by mouth daily. (Patient taking differently: Take 50 mg by mouth in the morning and at bedtime.)   SUMAtriptan (IMITREX) 100 MG tablet TAKE 1 TABLET BY MOUTH AS NEEDED FOR UP TO 1 DOSE FOR MIGRAINE   traZODone (DESYREL) 50 MG tablet Take 0.5-1 tablets (25-50 mg total) by mouth at bedtime as needed for sleep.   XARELTO 20 MG TABS tablet TAKE 1 TABLET BY MOUTH ONCE DAILY WITH SUPPER   No facility-administered encounter medications on file as of 07/28/2022.     Objective:     PHYSICAL EXAMINATION:    VITALS:   Vitals:   07/28/22 1126  BP: 124/73  Pulse: 71  Resp: 18  SpO2: 98%  Weight: 156 lb (70.8 kg)  Height: 5\' 8"  (1.727 m)    GEN:  The patient appears stated age and is in NAD. HEENT:  Normocephalic, atraumatic.   Neurological examination:  General: NAD, well-groomed, appears stated age. Orientation: The patient is alert. Oriented to person, place and date Cranial nerves: There is good facial symmetry.The speech is fluent and clear. No aphasia or dysarthria. Fund of knowledge is appropriate. Recent memory impaired and remote  memory is normal.  Attention and concentration are normal.  Able to name objects and repeat phrases.  Hearing is intact to conversational tone.    Sensation: Sensation is intact to light touch throughout Motor: Strength is at least antigravity x4. Tremors: none  DTR's 2/4 in UE/LE      06/14/2021    8:00 AM  Montreal Cognitive Assessment   Visuospatial/ Executive (0/5) 3  Naming (0/3) 3  Attention: Read list of digits (0/2) 2  Attention: Read list of letters (0/1) 1  Attention: Serial 7 subtraction starting at 100 (0/3) 3  Language: Repeat phrase (0/2) 1  Language : Fluency (0/1) 0  Abstraction (0/2) 1  Delayed Recall (0/5) 2  Orientation (0/6) 6  Total 22        No data to display             Movement examination: Tone: There is normal tone in the UE/LE Abnormal movements:  no tremor.  No myoclonus.  No asterixis.   Coordination:  There is no decremation with RAM's. Normal finger to nose  Gait and Station: The patient has no difficulty arising out of a deep-seated chair without the use of the hands. The patient's stride length is good.  Gait is cautious and narrow.   Thank you for allowing [place] the opportunity to participate in the care of this nice patient. Please do not hesitate to contact us for any questions or concerns.   Total time spent on today's visit was 25 minutes dedicated to this patient today, preparing to see patient, examining the patient, ordering tests and/or medications and counseling the patient, documenting clinical information in the EHR or other health record, independently interpreting results and communicating results to the patient/family, discussing treatment and goals, answering patient's questions and coordinating care.  Cc:  Marin Olp, MD  Sharene Butters 07/28/2022 1:24 PM

## 2022-07-28 NOTE — Therapy (Signed)
OUTPATIENT PHYSICAL THERAPY LOWER EXTREMITY TREATMENT   Patient Name: Danielle Rocha MRN: [id number] DOB:09-21-1951, 71 y.o., female Today's Date: 07/29/2022   PT End of Session - 07/29/22 1702     Visit Number 9    Number of Visits 28    Date for PT Re-Evaluation 08/15/22    Authorization Type AETNA MCR    PT Start Time 1702    PT Stop Time 1745    PT Time Calculation (min) 43 min    Activity Tolerance Patient tolerated treatment well    Behavior During Therapy WFL for tasks assessed/performed                  Past Medical History:  Diagnosis Date   Anemia    Arthritis    DJD, low back, thumb   Atrial fibrillation (HCC)    Xarelto anticoagulation. Flecainide antiarrythmic    Chicken pox    Chronic pain syndrome    On disability. History of bilateral hip pain, low back pain. Gabapentin 400 BID, percocet 1 tablet daily per prior provider.    Colon polyp    awaiting records   Depression    zoloft 12m, remeron 364mper psychiatry. ambien 1063mer psychiatry to help wtih sleep element.    Dysrhythmia    Dystonia    described as psychogenic dystonia. Pain and twisting from upper chest and up with triggers "wind, creamy food" on TID ativan per psychiatry previously.    Fibromyalgia    GERD (gastroesophageal reflux disease)    omeprazole OTC   Goiter    states multiple imaging tests, has had biopsies   Hyperlipidemia    lovastatin 90m22mStroke (HCC)    TIA (left side of face and body decreased sensitivity than right face and side)   TIA (transient ischemic attack)    Vaginal atrophy    estrace vaginal cream   Past Surgical History:  Procedure Laterality Date   ATRIAL FIBRILLATION ABLATION     BIOPSY THYROID     fusion l4-l5  09/22/1998   GLUTEUS MINIMUS REPAIR Right 05/19/2022   Procedure: RIGHT HIP GLUTEUS MEDIUS TENDON REPAIR;  Surgeon: BoksVanetta Mulders;  [facility];  Laterality: Right;   LAMINECTOMY     L4-L5   OTHER  SURGICAL HISTORY     tennis elbow surgery   piriformis release  09/22/1997   right hip   TONSILLECTOMY AND ADENOIDECTOMY  age 76   44AGINAL HYSTERECTOMY  09/23/1991   Patient Active Problem List   Diagnosis Date Noted   Tear of right gluteus medius tendon    Sore in nose 07/30/2021   Involuntary movements 02/14/2020   Depression with anxiety 02/14/2020   Aortic atherosclerosis (HCC)[place]/18/2021   Delayed gastric emptying 02/07/2020   Dystonia    Restless legs 07/27/2018   Recurrent UTI 10/07/2016   Major depression in full remission (HCC)[place]/08/2016   GERD (gastroesophageal reflux disease) 01/30/2016   History of SI/intentional drug overdose 11/22/2015   Atrial fibrillation (HCC)    Chronic pain syndrome    Hyperlipidemia     REFERRING PROVIDER: BoksVanetta Mulders  REFERRING DIAG: S76.011A (ICD-10-CM) - Tear of right gluteus medius tendon, initial encounter   THERAPY DIAG:  Stiffness of right hip, not elsewhere classified  Muscle weakness (generalized)  Difficulty in walking, not elsewhere classified  Pain in right hip  Pain in left hip  Rationale for Evaluation and Treatment Rehabilitation  ONSET DATE: DOS 05/19/2022  SUBJECTIVE:  SUBJECTIVE   Pt reports increase in R hip pain 1 day after last session spiking to 8-9/10 for a few days. It has started to decrease and is now 4.5/10      PERTINENT HISTORY:  -R hip gluteus medius/minimus repair and trochanteric bursectomy on 05/19/2022. No active abduction, IR or passive ER, adduction at least 6 weeks  -L4-5 Fusion in 2000, chronic pain syndrome, Atrial fibrillation, fibromyalgia, Depression, TIA  -R piriformis release in 1999 hip     PAIN:  Are you having pain? Yes NPRS:  1/10 at rest; 4.5/10 with movement   Location:  R lateral hip  Aggravating factors factors: standing walking  Relieving factors: rest   PRECAUTIONS: Other: per surgical protocol.  No active abduction, IR or passive ER, adduction at least 6  weeks.  Wb'ing restrictions.  L4-5 Fusion, A-fib  WEIGHT BEARING RESTRICTIONS Yes    FALLS:  Has patient fallen in last 6 months? Yes. Number of falls 1-2 due to medications  LIVING ENVIRONMENT: Lives with: lives with their spouse and lives with their son Lives in: one story home with an attic Stairs: 5 steps with rail to enter front, 4 steps with rail to enter side, and 4 steps, with rails to enter back  Has following equipment at home: Gilford Rile - 2 wheeled and rollator and quad cane  OCCUPATION: pt is retired  PLOF: Independent ; Pt ambulated without an AD.  Pt had increased pain with household chores.  She was limited with ambulation and shopping.   PATIENT GOALS pt wants to be able to walk without pain, gardening, and be out with her family.     OBJECTIVE:   DIAGNOSTIC FINDINGS: pt has had prior x rays and MRIs    TODAY'S TREATMENT: Pt seen for aquatic therapy today.  Treatment took place in water 3.25-74f [facility] Temp of water was 92.  Pt entered/exited the pool via stairs independently with bilat rail.   Unsupported:  forward/ backward walking  1 foam hand buoys submerged for core engagement side stepping x 6 widths Standing 4.5 ft holding to wall: hip circles right  Straddling noodle: cycling x 2 widths Seated lift chair: LAQ x15 Standing core engagement: 1/4 noodle pull down 2 x 1 -kick board row leading R/L then feet together x 10 ea position  Walking forward and back between exercises for recovery  Pt requires the buoyancy and hydrostatic pressure of water for support, and to offload joints by unweighting joint load by at least 50 % in navel deep water and by at least 75-80% in chest to neck deep water.  Viscosity of the water is needed for resistance of strengthening. Water current perturbations provides challenge to standing balance requiring increased core activation.     PATIENT EDUCATION:  Education details: Review of  aquatics exercises / modifications Person educated: Patient Education method: EConsulting civil engineer DMedia planner Verbal cues, and Handouts Education comprehension: verbalized understanding, returned demonstration, verbal cues required, and needs further education   HOME EXERCISE PROGRAM: Access Code: [email or web address] URL: [email or web address] Date: 05/23/2022 Prepared by: TRonny Flurry Exercises - ANKLE PUMPS  - 3 x daily - 7 x weekly - 3 sets - 10 reps Updated HEP: - Supine Quadricep Sets  - 2 x daily - 7 x weekly - 2 sets - 10 reps - 5 seconds hold - Supine Transversus Abdominis Bracing - Hands on Stomach  - 2 x daily - 7 x weekly - 2 sets - 10 reps -  5 seconds hold  ASSESSMENT:  CLINICAL IMPRESSION: Pt reports increase in pain over w/e. States she is unable to tolerate side lying on right which had improved. She needs multiple rest periods throughout session. Does not tolerate SLS right with left exercises. Focused on core strengthening and gentle ROM right hip. Goals ongoing.     OBJECTIVE IMPAIRMENTS Abnormal gait, decreased activity tolerance, decreased balance, decreased endurance, decreased mobility, difficulty walking, decreased ROM, decreased strength, hypomobility, impaired flexibility, and pain.   ACTIVITY LIMITATIONS lifting, bending, standing, squatting, stairs, transfers, bed mobility, dressing, and locomotion level  PARTICIPATION LIMITATIONS: cleaning, laundry, driving, shopping, community activity, and gardening  PERSONAL FACTORS Time since onset of injury/illness/exacerbation and 3+ comorbidities: L4-5 Fusion in 2000, chronic pain syndrome, Atrial fibrillation, fibromyalgia, Depression   are also affecting patient's functional outcome.   REHAB POTENTIAL: Good  CLINICAL DECISION MAKING: Stable/uncomplicated  EVALUATION COMPLEXITY: Low   GOALS:  SHORT TERM GOALS: Target date: 06/20/2022  Pt will be independent and compliant with HEP for improved pain,  strength, and function Baseline: Goal status: Ongoing   2.  Pt will progress with exercises per protocol without adverse effects to improve strength and mobility.   Baseline:  Goal status: Ongoing  Target Date:  07/04/2022   3.  Pt will progress with PROM per protocol without adverse effects for improved stiffness and mobility.  Baseline:  Goal status: MET -07/24/22 per pt report  Target date:  07/18/2022  4.  Pt will progress Wb'ing and appropriately wean AD per MD orders or protocol. Baseline: currently without AD in house, [facility] in community - 07/24/22 Goal status:Ongoing Target date:  07/04/2022  5.  Pt will be able to perform a 6 inch step up with good form and control with L LE leading for improved performance of stairs abd and improved functional strength. Baseline: has completed this in the water without difficulty  Goal status: Partially Met - 07/24/22 Target date:  08/15/2022   6.  Pt will report improved tolerance with standing and performing household chores.  Baseline:  Goal status: INITIAL Target date:  08/15/2022  LONG TERM GOALS: Target date: 09/11/2022   Pt will ambulate with a normalized heel to toe gait without limping Baseline:  Goal status: INITIAL  2.  Pt will ambulate extended community distance without an AD without significant pain and difficulty.  Baseline:  Goal status: INITIAL  3.  Pt will be able to perform her normal standing activities and household chores without significant pain.  Baseline:  Goal status: INITIAL  4.    Pt will demo at least 4/5 hip flexion, 4-/5 hip abd, and 5/5 knee extension strength for improved performance of and tolerance with functional mobility.  Baseline:  Goal status: INITIAL  5.  Pt will be able to perform stairs with a reciprocal gait with a rail with good control.  Baseline:  Goal status: Ongoing      PLAN: PT FREQUENCY:  1x/wk x 4 week and 2x/wk afterwards  PT DURATION: other: 16 weeks  PLANNED  INTERVENTIONS: Therapeutic exercises, Therapeutic activity, Neuromuscular re-education, Balance training, Gait training, Patient/Family education, Self Care, Joint mobilization, Stair training, DME instructions, Aquatic Therapy, Dry Needling, Cryotherapy, Moist heat, scar mobilization, Taping, Ultrasound, Manual therapy, and Re-evaluation  PLAN FOR NEXT SESSION: Cont per Dr. Eddie Dibbles gluteus medius repair protocol.    Pt to begin aquatic therapy.  Will plan to continue 1 aquatic and 1 land based PT each week.   Stanton Kidney Tharon Aquas) Yobani Schertzer MPT 07/29/22 5:03 PM  [facility] [address] [place], [place], [address] Phone: [phone]   Fax:  [phone]

## 2022-07-29 ENCOUNTER — Ambulatory Visit (HOSPITAL_BASED_OUTPATIENT_CLINIC_OR_DEPARTMENT_OTHER): Payer: Medicare HMO | Admitting: Physical Therapy

## 2022-07-29 ENCOUNTER — Encounter (HOSPITAL_BASED_OUTPATIENT_CLINIC_OR_DEPARTMENT_OTHER): Payer: Self-pay | Admitting: Physical Therapy

## 2022-07-29 ENCOUNTER — Telehealth: Payer: Self-pay | Admitting: Physical Medicine and Rehabilitation

## 2022-07-29 ENCOUNTER — Encounter: Payer: Self-pay | Admitting: Physician Assistant

## 2022-07-29 DIAGNOSIS — M25551 Pain in right hip: Secondary | ICD-10-CM | POA: Diagnosis not present

## 2022-07-29 DIAGNOSIS — M6281 Muscle weakness (generalized): Secondary | ICD-10-CM | POA: Diagnosis not present

## 2022-07-29 DIAGNOSIS — M25552 Pain in left hip: Secondary | ICD-10-CM

## 2022-07-29 DIAGNOSIS — R262 Difficulty in walking, not elsewhere classified: Secondary | ICD-10-CM

## 2022-07-29 DIAGNOSIS — M25651 Stiffness of right hip, not elsewhere classified: Secondary | ICD-10-CM | POA: Diagnosis not present

## 2022-07-29 NOTE — Telephone Encounter (Signed)
Patient wants to get injections for her neck set up. The best number to reach her [phone]

## 2022-07-31 ENCOUNTER — Encounter (HOSPITAL_BASED_OUTPATIENT_CLINIC_OR_DEPARTMENT_OTHER): Payer: Self-pay | Admitting: Physical Therapy

## 2022-07-31 ENCOUNTER — Ambulatory Visit (HOSPITAL_BASED_OUTPATIENT_CLINIC_OR_DEPARTMENT_OTHER): Payer: Medicare HMO | Admitting: Physical Therapy

## 2022-07-31 DIAGNOSIS — M25551 Pain in right hip: Secondary | ICD-10-CM | POA: Diagnosis not present

## 2022-07-31 DIAGNOSIS — M25651 Stiffness of right hip, not elsewhere classified: Secondary | ICD-10-CM | POA: Diagnosis not present

## 2022-07-31 DIAGNOSIS — M6281 Muscle weakness (generalized): Secondary | ICD-10-CM

## 2022-07-31 DIAGNOSIS — R262 Difficulty in walking, not elsewhere classified: Secondary | ICD-10-CM | POA: Diagnosis not present

## 2022-07-31 DIAGNOSIS — M25552 Pain in left hip: Secondary | ICD-10-CM | POA: Diagnosis not present

## 2022-07-31 NOTE — Therapy (Signed)
OUTPATIENT PHYSICAL THERAPY LOWER EXTREMITY TREATMENT / PROGRESS NOTE   Progress Note Reporting Period 05/23/22 to 07/31/2022  See note below for Objective Data and Assessment of Progress/Goals.       Patient Name: Danielle Rocha MRN: [id number] DOB:09/22/1951, 71 y.o., female Today's Date: 08/01/2022   PT End of Session - 07/31/22 1029     Visit Number 10    Number of Visits 26    Date for PT Re-Evaluation 09/25/22    Authorization Type [employer] MCR    PT Start Time 1021    PT Stop Time 1103    PT Time Calculation (min) 42 min    Activity Tolerance Patient tolerated treatment well    Behavior During Therapy WFL for tasks assessed/performed                  Past Medical History:  Diagnosis Date   Anemia    Arthritis    DJD, low back, thumb   Atrial fibrillation (HCC)    Xarelto anticoagulation. Flecainide antiarrythmic    Chicken pox    Chronic pain syndrome    On disability. History of bilateral hip pain, low back pain. Gabapentin 400 BID, percocet 1 tablet daily per prior provider.    Colon polyp    awaiting records   Depression    zoloft 162m, remeron 317mper psychiatry. ambien 1052mer psychiatry to help wtih sleep element.    Dysrhythmia    Dystonia    described as psychogenic dystonia. Pain and twisting from upper chest and up with triggers "wind, creamy food" on TID ativan per psychiatry previously.    Fibromyalgia    GERD (gastroesophageal reflux disease)    omeprazole OTC   Goiter    states multiple imaging tests, has had biopsies   Hyperlipidemia    lovastatin 14m76mStroke (HCC)    TIA (left side of face and body decreased sensitivity than right face and side)   TIA (transient ischemic attack)    Vaginal atrophy    estrace vaginal cream   Past Surgical History:  Procedure Laterality Date   ATRIAL FIBRILLATION ABLATION     BIOPSY THYROID     fusion l4-l5  09/22/1998   GLUTEUS MINIMUS REPAIR Right 05/19/2022   Procedure: RIGHT HIP GLUTEUS  MEDIUS TENDON REPAIR;  Surgeon: BoksVanetta Mulders;  [facility];  Laterality: Right;   LAMINECTOMY     L4-L5   OTHER SURGICAL HISTORY     tennis elbow surgery   piriformis release  09/22/1997   right hip   TONSILLECTOMY AND ADENOIDECTOMY  age 22   22AGINAL HYSTERECTOMY  09/23/1991   Patient Active Problem List   Diagnosis Date Noted   Tear of right gluteus medius tendon    Sore in nose 07/30/2021   Involuntary movements 02/14/2020   Depression with anxiety 02/14/2020   Aortic atherosclerosis (HCC)[place]/18/2021   Delayed gastric emptying 02/07/2020   Dystonia    Restless legs 07/27/2018   Recurrent UTI 10/07/2016   Major depression in full remission (HCC)[place]/08/2016   GERD (gastroesophageal reflux disease) 01/30/2016   History of SI/intentional drug overdose 11/22/2015   Atrial fibrillation (HCC)    Chronic pain syndrome    Hyperlipidemia     REFERRING PROVIDER: BoksVanetta Mulders  REFERRING DIAG: S76.011A (ICD-10-CM) - Tear of right gluteus medius tendon, initial encounter   THERAPY DIAG:  Stiffness of right hip, not elsewhere classified  Muscle weakness (generalized)  Difficulty in walking,  not elsewhere classified  Pain in right hip  Rationale for Evaluation and Treatment Rehabilitation  ONSET DATE: DOS 05/19/2022  SUBJECTIVE:   SUBJECTIVE   Pt is 10 weeks and 3 days s/p R hip gluteus medius/minimus repair and trochanteric bursectomy.  Pt was last seen on land on 10/16 and then went to the beach for 1 week.  Pt states she felt ok at the beach and had to perform a lot of steps.  Pt returned to PT and had aquatic therapy.  She reports having increased pain for 4 days.  Pt had aquatic therapy on Tuesday and felt better.  She didn't have increased pain after prior Aquatic rx.  Pt states she is still hurting more than she was but doing better overall.   FUNCTIONAL IMPROVEMENTS:  getting OOB, lying on R side a few mins, stairs, ambulation, tub  transfers, standing to take showers, sitting, riding in car, cooking, laundry.  Pt doesn't use quad cane in home.  FUNCTIONAL LIMITATIONS:  ambulation distance, prolonged sitting, squatting, sidestepping, household chores.  Pt ambulates with quad cane outside of home.       PERTINENT HISTORY:  -R hip gluteus medius/minimus repair and trochanteric bursectomy on 05/19/2022.  -L4-5 Fusion in 2000, chronic pain syndrome, Atrial fibrillation, fibromyalgia, Depression, TIA  -R piriformis release in 1999 hip     PAIN:  Are you having pain? Yes NPRS:  4/10  Location:  deep in L hip  Aggravating factors factors: standing walking  Relieving factors: rest   PRECAUTIONS: Other: per surgical protocol.  Wb'ing restrictions.  L4-5 Fusion, A-fib  WEIGHT BEARING RESTRICTIONS Yes    FALLS:  Has patient fallen in last 6 months? Yes. Number of falls 1-2 due to medications  LIVING ENVIRONMENT: Lives with: lives with their spouse and lives with their son Lives in: one story home with an attic Stairs: 5 steps with rail to enter front, 4 steps with rail to enter side, and 4 steps, with rails to enter back  Has following equipment at home: Gilford Rile - 2 wheeled and rollator and quad cane  OCCUPATION: pt is retired  PLOF: Independent ; Pt ambulated without an AD.  Pt had increased pain with household chores.  She was limited with ambulation and shopping.   PATIENT GOALS pt wants to be able to walk without pain, gardening, and be out with her family.     OBJECTIVE:   DIAGNOSTIC FINDINGS: pt has had prior x rays and MRIs    TODAY'S TREATMENT: PATIENT SURVEYS:  FOTO Initial/Current:  33/48 with a goal of  52 at visit #18     PALPATION: Pt is tender to palpate in R glute and t/o hip ant, lat, and post     LOWER EXTREMITY ROM:    AROM:  Hip flexion: 98 deg     Abduction:  12     IR:  24   PROM:  Hip flexion:  96     Abd:  15 deg     GAIT: Comments: Pt ambulated with quad cane with good heel  to toe gait pattern without limping.  Pt has a slow gait.  Pt ambulated without quad cane with minimal limp    -Pt performed:  Seated hip abd isometrics with 5 sec hold  Stool rotations IR/ER 2x10  -Reviewed and Updated HEP.  Pt received a HEP handout and was educated in correct form and appropriate frequency.  Pt instructed to push gently and she should not have pain with  HEP.    -See below for pt education   PATIENT EDUCATION:  Education details: exercise form, HEP, POC, objective findings, exercise form, and exercise rationale.  Person educated: Patient Education method: Explanation, Demonstration, Verbal cues, and Handouts Education comprehension: verbalized understanding, returned demonstration, verbal cues required, and needs further education   HOME EXERCISE PROGRAM: Access Code: [email or web address] URL: [email or web address] Date: 05/23/2022 Prepared by: Ronny Flurry  Updated HEP: - Seated Isometric Hip Abduction  - 1 x daily - 5 x weekly - 1-2 sets - 5-10 reps - 5 seconds hold    ASSESSMENT:  CLINICAL IMPRESSION: Pt is 10 weeks and 3 days post op.  Pt recently reports having increased pain though is feeling better.  She did well after prior aquatic Rx.  Pt is improving with function as evidenced by subjective reports of improved bed mobility, ambulation, transfers standing duration in shower, household chores, and performing stairs.  She continues to be limited with ambulation, prolonged sitting, squatting, and household chores.  Pt demonstrates improved gait with and without quad cane.  Pt has improved with hip ROM though is still limited.  She is progressing slowly with strength as evidenced by performance of exercises.  Pt demonstrates improved self perceived disability with FOTO score improving from 33 to 48.  Pt has met STG's #1,3, and 6.  Pt should benefit from cont skilled PT services per protocol to address ongoing goals and to improve overall function.      OBJECTIVE IMPAIRMENTS Abnormal gait, decreased activity tolerance, decreased balance, decreased endurance, decreased mobility, difficulty walking, decreased ROM, decreased strength, hypomobility, impaired flexibility, and pain.   ACTIVITY LIMITATIONS lifting, bending, standing, squatting, stairs, transfers, bed mobility, dressing, and locomotion level  PARTICIPATION LIMITATIONS: cleaning, laundry, driving, shopping, community activity, and gardening  PERSONAL FACTORS Time since onset of injury/illness/exacerbation and 3+ comorbidities: L4-5 Fusion in 2000, chronic pain syndrome, Atrial fibrillation, fibromyalgia, Depression   are also affecting patient's functional outcome.   REHAB POTENTIAL: Good  CLINICAL DECISION MAKING: Stable/uncomplicated  EVALUATION COMPLEXITY: Low   GOALS:  SHORT TERM GOALS: Target date: 06/20/2022  Pt will be independent and compliant with HEP for improved pain, strength, and function Baseline: Goal status: GOAL MET  2.  Pt will progress with exercises per protocol without adverse effects to improve strength and mobility.   Baseline:  Goal status: Ongoing  Target Date:  07/04/2022   3.  Pt will progress with PROM per protocol without adverse effects for improved stiffness and mobility.  Baseline:  Goal status: MET -07/24/22 per pt report  Target date:  07/18/2022  4.  Pt will progress Wb'ing and appropriately wean AD per MD orders or protocol. Baseline: currently without AD in house, [facility] in community - 07/24/22 Goal status:Ongoing Target date:  07/04/2022  5.  Pt will be able to perform a 6 inch step up with good form and control with L LE leading for improved performance of stairs abd and improved functional strength. Baseline: has completed this in the water without difficulty  Goal status: Partially Met - 07/24/22 Target date:  08/15/2022   6.  Pt will report improved tolerance with standing and performing household chores.  Baseline:  Goal  status: GOAL MET Target date:  08/15/2022  LONG TERM GOALS: Target date: 09/25/2021   Pt will ambulate with a normalized heel to toe gait without limping Baseline:  Goal status: ONGOING  2.  Pt will ambulate extended community distance without an AD without significant pain and difficulty.  Baseline:  Goal status: ONGOING  3.  Pt will be able to perform her normal standing activities and household chores without significant pain.  Baseline:  Goal status: ONGOING  4.    Pt will demo at least 4/5 hip flexion, 4-/5 hip abd, and 5/5 knee extension strength for improved performance of and tolerance with functional mobility.  Baseline:  Goal status: ONGOING  5.  Pt will be able to perform stairs with a reciprocal gait with a rail with good control.  Baseline:  Goal status: Ongoing      PLAN: PT FREQUENCY:  2x/wk  PT DURATION: other: 8 weeks  PLANNED INTERVENTIONS: Therapeutic exercises, Therapeutic activity, Neuromuscular re-education, Balance training, Gait training, Patient/Family education, Self Care, Joint mobilization, Stair training, DME instructions, Aquatic Therapy, Dry Needling, Cryotherapy, Moist heat, scar mobilization, Taping, Ultrasound, Manual therapy, and Re-evaluation  PLAN FOR NEXT SESSION: Cont per Dr. Eddie Dibbles gluteus medius repair protocol.  Pt to continue with 1 aquatic and 1 land based PT each week.   Selinda Michaels III PT, DPT 08/01/22 11:37 AM

## 2022-08-01 NOTE — Telephone Encounter (Signed)
Received B/T discontinuation form from Dr. Durene Cal. Spoke with patient and informed her to stop Xarelto 2 days prior to injection. Injection scheduled for 08/18/22

## 2022-08-02 NOTE — Therapy (Incomplete)
OUTPATIENT PHYSICAL THERAPY LOWER EXTREMITY TREATMENT   Patient Name: Danielle Rocha MRN: [id number] DOB:September 20, 1951, 71 y.o., female Today's Date: 08/02/2022          Past Medical History:  Diagnosis Date   Anemia    Arthritis    DJD, low back, thumb   Atrial fibrillation (HCC)    Xarelto anticoagulation. Flecainide antiarrythmic    Chicken pox    Chronic pain syndrome    On disability. History of bilateral hip pain, low back pain. Gabapentin 400 BID, percocet 1 tablet daily per prior provider.    Colon polyp    awaiting records   Depression    zoloft 118m, remeron 351mper psychiatry. ambien 1074mer psychiatry to help wtih sleep element.    Dysrhythmia    Dystonia    described as psychogenic dystonia. Pain and twisting from upper chest and up with triggers "wind, creamy food" on TID ativan per psychiatry previously.    Fibromyalgia    GERD (gastroesophageal reflux disease)    omeprazole OTC   Goiter    states multiple imaging tests, has had biopsies   Hyperlipidemia    lovastatin 56m62mStroke (HCC)    TIA (left side of face and body decreased sensitivity than right face and side)   TIA (transient ischemic attack)    Vaginal atrophy    estrace vaginal cream   Past Surgical History:  Procedure Laterality Date   ATRIAL FIBRILLATION ABLATION     BIOPSY THYROID     fusion l4-l5  09/22/1998   GLUTEUS MINIMUS REPAIR Right 05/19/2022   Procedure: RIGHT HIP GLUTEUS MEDIUS TENDON REPAIR;  Surgeon: BoksVanetta Mulders;  [facility];  Laterality: Right;   LAMINECTOMY     L4-L5   OTHER SURGICAL HISTORY     tennis elbow surgery   piriformis release  09/22/1997   right hip   TONSILLECTOMY AND ADENOIDECTOMY  age 75   67AGINAL HYSTERECTOMY  09/23/1991   Patient Active Problem List   Diagnosis Date Noted   Tear of right gluteus medius tendon    Sore in nose 07/30/2021   Involuntary movements 02/14/2020   Depression with anxiety 02/14/2020    Aortic atherosclerosis (HCC)[place]/18/2021   Delayed gastric emptying 02/07/2020   Dystonia    Restless legs 07/27/2018   Recurrent UTI 10/07/2016   Major depression in full remission (HCC)[place]/08/2016   GERD (gastroesophageal reflux disease) 01/30/2016   History of SI/intentional drug overdose 11/22/2015   Atrial fibrillation (HCC)    Chronic pain syndrome    Hyperlipidemia     REFERRING PROVIDER: BoksVanetta Mulders  REFERRING DIAG: S76.011A (ICD-10-CM) - Tear of right gluteus medius tendon, initial encounter   THERAPY DIAG:  No diagnosis found.  Rationale for Evaluation and Treatment Rehabilitation  ONSET DATE: DOS 05/19/2022  SUBJECTIVE:   SUBJECTIVE   Pt is 11 weeks s/p R hip gluteus medius/minimus repair and trochanteric bursectomy.  Pt was last seen on land on 10/16 and then went to the beach for 1 week.  Pt states she felt ok at the beach and had to perform a lot of steps.  Pt returned to PT and had aquatic therapy.  She reports having increased pain for 4 days.  Pt had aquatic therapy on Tuesday and felt better.  She didn't have increased pain after prior Aquatic rx.  Pt states she is still hurting more than she was but doing better overall.   FUNCTIONAL IMPROVEMENTS:  getting OOB,  lying on R side a few mins, stairs, ambulation, tub transfers, standing to take showers, sitting, riding in car, cooking, laundry.  Pt doesn't use quad cane in home.  FUNCTIONAL LIMITATIONS:  ambulation distance, prolonged sitting, squatting, sidestepping, household chores.  Pt ambulates with quad cane outside of home.       PERTINENT HISTORY:  -R hip gluteus medius/minimus repair and trochanteric bursectomy on 05/19/2022.  -L4-5 Fusion in 2000, chronic pain syndrome, Atrial fibrillation, fibromyalgia, Depression, TIA  -R piriformis release in 1999 hip     PAIN:  Are you having pain? Yes NPRS:  4/10  Location:  deep in L hip  Aggravating factors factors: standing walking  Relieving factors:  rest   PRECAUTIONS: Other: per surgical protocol.  Wb'ing restrictions.  L4-5 Fusion, A-fib  WEIGHT BEARING RESTRICTIONS Yes    FALLS:  Has patient fallen in last 6 months? Yes. Number of falls 1-2 due to medications  LIVING ENVIRONMENT: Lives with: lives with their spouse and lives with their son Lives in: one story home with an attic Stairs: 5 steps with rail to enter front, 4 steps with rail to enter side, and 4 steps, with rails to enter back  Has following equipment at home: Gilford Rile - 2 wheeled and rollator and quad cane  OCCUPATION: pt is retired  PLOF: Independent ; Pt ambulated without an AD.  Pt had increased pain with household chores.  She was limited with ambulation and shopping.   PATIENT GOALS pt wants to be able to walk without pain, gardening, and be out with her family.     OBJECTIVE:   DIAGNOSTIC FINDINGS: pt has had prior x rays and MRIs    TODAY'S TREATMENT:  Pt seen for aquatic therapy today.  Treatment took place in water 3.25-8f [facility] Temp of water was 92.  Pt entered/exited the pool via stairs independently with bilat rail.     Unsupported:  forward/ backward walking  1 foam hand buoys submerged for core engagement side stepping x 6 widths Standing 4.5 ft holding to wall: hip circles right  Straddling noodle: cycling x 2 widths Seated lift chair: LAQ x15 Standing core engagement: 1/4 noodle pull down 2 x 1 -kick board row leading R/L then feet together x 10 ea position   Walking forward and back between exercises for recovery   Pt requires the buoyancy and hydrostatic pressure of water for support, and to offload joints by unweighting joint load by at least 50 % in navel deep water and by at least 75-80% in chest to neck deep water.  Viscosity of the water is needed for resistance of strengthening. Water current perturbations provides challenge to standing balance requiring increased core activation. -See below  for pt education   PATIENT EDUCATION:  Education details: exercise form, HEP, POC, objective findings, exercise form, and exercise rationale.  Person educated: Patient Education method: Explanation, Demonstration, Verbal cues, and Handouts Education comprehension: verbalized understanding, returned demonstration, verbal cues required, and needs further education   HOME EXERCISE PROGRAM: Access Code: [email or web address] URL: [email or web address] Date: 05/23/2022 Prepared by: TRonny Flurry Updated HEP: - Seated Isometric Hip Abduction  - 1 x daily - 5 x weekly - 1-2 sets - 5-10 reps - 5 seconds hold    ASSESSMENT:  CLINICAL IMPRESSION: Pt is 10 weeks and 3 days post op.  Pt recently reports having increased pain though is feeling better.  She did well after prior aquatic Rx.  Pt is improving  with function as evidenced by subjective reports of improved bed mobility, ambulation, transfers standing duration in shower, household chores, and performing stairs.  She continues to be limited with ambulation, prolonged sitting, squatting, and household chores.  Pt demonstrates improved gait with and without quad cane.  Pt has improved with hip ROM though is still limited.  She is progressing slowly with strength as evidenced by performance of exercises.  Pt demonstrates improved self perceived disability with FOTO score improving from 33 to 48.  Pt has met STG's #1,3, and 6.  Pt should benefit from cont skilled PT services per protocol to address ongoing goals and to improve overall function.     OBJECTIVE IMPAIRMENTS Abnormal gait, decreased activity tolerance, decreased balance, decreased endurance, decreased mobility, difficulty walking, decreased ROM, decreased strength, hypomobility, impaired flexibility, and pain.   ACTIVITY LIMITATIONS lifting, bending, standing, squatting, stairs, transfers, bed mobility, dressing, and locomotion level  PARTICIPATION LIMITATIONS: cleaning, laundry,  driving, shopping, community activity, and gardening  PERSONAL FACTORS Time since onset of injury/illness/exacerbation and 3+ comorbidities: L4-5 Fusion in 2000, chronic pain syndrome, Atrial fibrillation, fibromyalgia, Depression   are also affecting patient's functional outcome.   REHAB POTENTIAL: Good  CLINICAL DECISION MAKING: Stable/uncomplicated  EVALUATION COMPLEXITY: Low   GOALS:  SHORT TERM GOALS: Target date: 06/20/2022  Pt will be independent and compliant with HEP for improved pain, strength, and function Baseline: Goal status: GOAL MET  2.  Pt will progress with exercises per protocol without adverse effects to improve strength and mobility.   Baseline:  Goal status: Ongoing  Target Date:  07/04/2022   3.  Pt will progress with PROM per protocol without adverse effects for improved stiffness and mobility.  Baseline:  Goal status: MET -07/24/22 per pt report  Target date:  07/18/2022  4.  Pt will progress Wb'ing and appropriately wean AD per MD orders or protocol. Baseline: currently without AD in house, [facility] in community - 07/24/22 Goal status:Ongoing Target date:  07/04/2022  5.  Pt will be able to perform a 6 inch step up with good form and control with L LE leading for improved performance of stairs abd and improved functional strength. Baseline: has completed this in the water without difficulty  Goal status: Partially Met - 07/24/22 Target date:  08/15/2022   6.  Pt will report improved tolerance with standing and performing household chores.  Baseline:  Goal status: GOAL MET Target date:  08/15/2022  LONG TERM GOALS: Target date: 09/25/2021   Pt will ambulate with a normalized heel to toe gait without limping Baseline:  Goal status: ONGOING  2.  Pt will ambulate extended community distance without an AD without significant pain and difficulty.  Baseline:  Goal status: ONGOING  3.  Pt will be able to perform her normal standing activities and  household chores without significant pain.  Baseline:  Goal status: ONGOING  4.    Pt will demo at least 4/5 hip flexion, 4-/5 hip abd, and 5/5 knee extension strength for improved performance of and tolerance with functional mobility.  Baseline:  Goal status: ONGOING  5.  Pt will be able to perform stairs with a reciprocal gait with a rail with good control.  Baseline:  Goal status: Ongoing      PLAN: PT FREQUENCY:  2x/wk  PT DURATION: other: 8 weeks  PLANNED INTERVENTIONS: Therapeutic exercises, Therapeutic activity, Neuromuscular re-education, Balance training, Gait training, Patient/Family education, Self Care, Joint mobilization, Stair training, DME instructions, Aquatic Therapy, Dry Needling, Cryotherapy,  Moist heat, scar mobilization, Taping, Ultrasound, Manual therapy, and Re-evaluation  PLAN FOR NEXT SESSION: Cont per Dr. Eddie Dibbles gluteus medius repair protocol.  Pt to continue with 1 aquatic and 1 land based PT each week.   Selinda Michaels III PT, DPT 08/02/22 7:14 AM

## 2022-08-04 ENCOUNTER — Encounter (HOSPITAL_BASED_OUTPATIENT_CLINIC_OR_DEPARTMENT_OTHER): Payer: Self-pay

## 2022-08-04 ENCOUNTER — Other Ambulatory Visit (HOSPITAL_COMMUNITY): Payer: Self-pay

## 2022-08-04 ENCOUNTER — Ambulatory Visit (HOSPITAL_BASED_OUTPATIENT_CLINIC_OR_DEPARTMENT_OTHER): Payer: Medicare HMO | Admitting: Physical Therapy

## 2022-08-06 ENCOUNTER — Ambulatory Visit (HOSPITAL_BASED_OUTPATIENT_CLINIC_OR_DEPARTMENT_OTHER): Payer: Medicare HMO | Admitting: Physical Therapy

## 2022-08-08 ENCOUNTER — Ambulatory Visit (INDEPENDENT_AMBULATORY_CARE_PROVIDER_SITE_OTHER): Payer: Medicare HMO | Admitting: Orthopaedic Surgery

## 2022-08-08 DIAGNOSIS — S76011A Strain of muscle, fascia and tendon of right hip, initial encounter: Secondary | ICD-10-CM

## 2022-08-08 NOTE — Progress Notes (Signed)
Post Operative Evaluation    Procedure/Date of Surgery: right hip gluteus medius repair 05/19/22  Interval History:    Presents 3 months status post right hip gluteus medius repair.  Overall she is continuing to make slow progress.  She has been benefiting from rehab.  Her pain is about a 3 or 4 at this point.  She recently has begun aquatic therapy.   PMH/PSH/Family History/Social History/Meds/Allergies:    Past Medical History:  Diagnosis Date   Anemia    Arthritis    DJD, low back, thumb   Atrial fibrillation (HCC)    Xarelto anticoagulation. Flecainide antiarrythmic    Chicken pox    Chronic pain syndrome    On disability. History of bilateral hip pain, low back pain. Gabapentin 400 BID, percocet 1 tablet daily per prior provider.    Colon polyp    awaiting records   Depression    zoloft 100mg , remeron 30mg  per psychiatry. ambien 10mg  per psychiatry to help wtih sleep element.    Dysrhythmia    Dystonia    described as psychogenic dystonia. Pain and twisting from upper chest and up with triggers "wind, creamy food" on TID ativan per psychiatry previously.    Fibromyalgia    GERD (gastroesophageal reflux disease)    omeprazole OTC   Goiter    states multiple imaging tests, has had biopsies   Hyperlipidemia    lovastatin 20mg    Stroke (HCC)    TIA (left side of face and body decreased sensitivity than right face and side)   TIA (transient ischemic attack)    Vaginal atrophy    estrace vaginal cream   Past Surgical History:  Procedure Laterality Date   ATRIAL FIBRILLATION ABLATION     BIOPSY THYROID     fusion l4-l5  09/22/1998   GLUTEUS MINIMUS REPAIR Right 05/19/2022   Procedure: RIGHT HIP GLUTEUS MEDIUS TENDON REPAIR;  Surgeon: , MD;  Location: [facility] OR;  Service: Orthopedics;  Laterality: Right;   LAMINECTOMY     L4-L5   OTHER SURGICAL HISTORY     tennis elbow surgery   piriformis release  09/22/1997   right  hip   TONSILLECTOMY AND ADENOIDECTOMY  age 48   VAGINAL HYSTERECTOMY  09/23/1991   Social History   Socioeconomic History   Marital status: Married    Spouse name: Not on file   Number of children: 3   Years of education: 13   Highest education level: Not on file  Occupational History   Occupation: retired  Tobacco Use   Smoking status: Never   Smokeless tobacco: Never  Vaping Use   Vaping Use: Never used  Substance and Sexual Activity   Alcohol use: No    Alcohol/week: 0.0 standard drinks of alcohol   Drug use: No   Sexual activity: Not Currently    Partners: Male  Other Topics Concern   Not on file  Social History Narrative   Family: Married. Husband works as [employer] for 11/20/1997 center. 2 children from previous marriage 1 from current. Son 9 lives with them and has down's syndrome.       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 Moved to [facility] from [place] and became disabled in that time frame due to hip/back issues.       Hobbies: time  with son   Right handed   One story home   Caffeine occasionally   Social Determinants of Health   Financial Resource Strain: Low Risk  (09/09/2021)   Overall Financial Resource Strain (CARDIA)    Difficulty of Paying Living Expenses: Not hard at all  Food Insecurity: No Food Insecurity (09/09/2021)   Hunger Vital Sign    Worried About Running Out of Food in the Last Year: Never true    Ran Out of Food in the Last Year: Never true  Transportation Needs: No Transportation Needs (09/09/2021)   PRAPARE - Administrator, Civil Service (Medical): No    Lack of Transportation (Non-Medical): No  Physical Activity: Insufficiently Active (09/09/2021)   Exercise Vital Sign    Days of Exercise per Week: 3 days    Minutes of Exercise per Session: 30 min  Stress: No Stress Concern Present (09/09/2021)   [employer] of Occupational Health - Occupational Stress Questionnaire    Feeling of  Stress : Not at all  Social Connections: Unknown (09/09/2021)   Social Connection and Isolation Panel [NHANES]    Frequency of Communication with Friends and Family: More than three times a week    Frequency of Social Gatherings with Friends and Family: More than three times a week    Attends Religious Services: More than 4 times per year    Active Member of [employer] or Organizations: No    Attends Engineer, structural: Never    Marital Status: Not on file   Family History  Problem Relation Age of Onset   Alcohol abuse Mother        possible she had stomach cancer as well but unsure   Hypertension Father    Alcohol abuse Father    Pancreatic cancer Father    Cancer Brother        spindle cell RLE   Diabetes Brother    Cancer Brother        tonsil cancer   Skin cancer Brother    Colon cancer Neg Hx    Colon polyps Neg Hx    Stomach cancer Neg Hx    Allergies  Allergen Reactions   Reglan [Metoclopramide] Other (See Comments)    Suicidal thoughts   Ciprofloxacin Diarrhea   Clindamycin/Lincomycin Diarrhea   Doxycycline Diarrhea and Nausea And Vomiting   Macrobid [Nitrofurantoin Monohyd Macro] Rash    Rash on lip   Sulfa Antibiotics Rash   Current Outpatient Medications  Medication Sig Dispense Refill   methocarbamol (ROBAXIN) 500 MG tablet Take 1 tablet (500 mg total) by mouth 4 (four) times daily. (Patient not taking: Reported on 05/14/2022) 30 tablet 0   methocarbamol (ROBAXIN) 500 MG tablet Take 1 tablet (500 mg total) by mouth 4 (four) times daily. 30 tablet 0   acetaminophen (TYLENOL) 500 MG tablet Take 1,000 mg by mouth every 6 (six) hours as needed for mild pain, moderate pain, fever or headache.     buPROPion (WELLBUTRIN XL) 150 MG 24 hr tablet Take 300 mg by mouth daily.     cephALEXin (KEFLEX) 250 MG capsule Take 250 mg by mouth daily.     Cholecalciferol (VITAMIN D) 125 MCG (5000 UT) CAPS Take 5,000 Units by mouth daily.     Coenzyme Q10 200 MG TABS Take 200  mg by mouth daily.     Cyanocobalamin (B-12 PO) Take 1 tablet by mouth daily.     cyclobenzaprine (FLEXERIL) 10 MG tablet Take 1 tablet (10  mg total) by mouth 3 (three) times daily as needed for muscle spasms. 30 tablet 0   EMGALITY 120 MG/ML SOAJ INJECT  240 MG SUBCUTANEOUSLY ONCE FOR 1 DOSE LOADING DOSE (Patient not taking: Reported on 05/14/2022) 2 mL 0   flecainide (TAMBOCOR) 100 MG tablet Take 1 tablet (100 mg total) by mouth 2 (two) times daily. 180 tablet 3   Galcanezumab-gnlm (EMGALITY) 120 MG/ML SOAJ Inject 120 mg into the skin every 30 (thirty) days. 1.12 mL 5   Magnesium Oxide 420 MG TABS Take 420 mg by mouth daily.     melatonin 5 MG TABS Take 5 mg by mouth at bedtime.     Multiple Vitamin (MULTIVITAMIN WITH MINERALS) TABS tablet Take 1 tablet by mouth daily.     ondansetron (ZOFRAN ODT) 4 MG disintegrating tablet Take 1 tablet (4 mg total) by mouth every 8 (eight) hours as needed for nausea or vomiting. 20 tablet 0   oxyCODONE (OXY IR/ROXICODONE) 5 MG immediate release tablet Take 1 tablet (5 mg total) by mouth every 4 (four) hours as needed (severe pain). (Patient not taking: Reported on 05/14/2022) 20 tablet 0   oxyCODONE (OXY IR/ROXICODONE) 5 MG immediate release tablet Take 1 tablet (5 mg total) by mouth every 4 (four) hours as needed (severe pain). 15 tablet 0   pantoprazole (PROTONIX) 20 MG tablet TAKE 1 TABLET BY MOUTH TWICE DAILY BEFORE A MEAL. 180 tablet 0   pyridOXINE (VITAMIN B6) 100 MG tablet Take 100 mg by mouth daily.     rivaroxaban (XARELTO) 20 MG TABS tablet Take 1 tablet (20 mg total) by mouth daily with supper. 90 tablet 3   rosuvastatin (CRESTOR) 20 MG tablet Take 1 tablet by mouth once daily 90 tablet 0   sertraline (ZOLOFT) 50 MG tablet Take 1 tablet (50 mg total) by mouth daily. (Patient taking differently: Take 50 mg by mouth in the morning and at bedtime.) 30 tablet 6   SUMAtriptan (IMITREX) 100 MG tablet TAKE 1 TABLET BY MOUTH AS NEEDED FOR UP TO 1 DOSE FOR  MIGRAINE 10 tablet 0   traZODone (DESYREL) 50 MG tablet Take 0.5-1 tablets (25-50 mg total) by mouth at bedtime as needed for sleep. 30 tablet 5   No current facility-administered medications for this visit.   No results found.  Review of Systems:   A ROS was performed including pertinent positives and negatives as documented in the HPI.   Musculoskeletal Exam:    There were no vitals taken for this visit.  Right hip is well-appearing.  No erythema or drainage.  Active flexion about the right hip is to 120 degrees while in the seated position.  Internal and external rotation is to 30 degrees.  She is walking with a cane.  Imaging:    none  I personally reviewed and interpreted the radiographs.   Assessment:   71 year old female who is 3 months status post right hip gluteus medius repair continues to make progress.  This time I would like her to continue physical therapy as I do believe that she is benefiting from this.  She is making slow but steady progress.  I described that given the duration of her previous symptoms she may not ever be completely pain-free although I would hope that we are able to diminish her pain level down to a level 1 or 2 at rest.  Plan :    -Return to clinic in 8 months for reassessment      I personally saw  and evaluated the patient, and participated in the management and treatment plan.  Vanetta Mulders, MD Attending Physician, Orthopedic Surgery  This document was dictated using Dragon voice recognition software. A reasonable attempt at proof reading has been made to minimize errors.

## 2022-08-10 ENCOUNTER — Other Ambulatory Visit: Payer: Self-pay | Admitting: Physician Assistant

## 2022-08-11 ENCOUNTER — Other Ambulatory Visit: Payer: Self-pay | Admitting: Family Medicine

## 2022-08-15 ENCOUNTER — Encounter: Payer: Self-pay | Admitting: Physical Medicine and Rehabilitation

## 2022-08-17 NOTE — Therapy (Signed)
OUTPATIENT PHYSICAL THERAPY LOWER EXTREMITY TREATMENT   Patient Name: Danielle Rocha MRN: [id number] DOB:January 13, 1951, 71 y.o., female Today's Date: 08/18/2022   PT End of Session - 08/18/22 0935     Visit Number 11    Number of Visits 26    Date for PT Re-Evaluation 09/25/22    Authorization Type AETNA MCR    PT Start Time 0900    PT Stop Time 0938    PT Time Calculation (min) 38 min    Activity Tolerance Patient tolerated treatment well    Behavior During Therapy WFL for tasks assessed/performed                   Past Medical History:  Diagnosis Date   Anemia    Arthritis    DJD, low back, thumb   Atrial fibrillation (HCC)    Xarelto anticoagulation. Flecainide antiarrythmic    Chicken pox    Chronic pain syndrome    On disability. History of bilateral hip pain, low back pain. Gabapentin 400 BID, percocet 1 tablet daily per prior provider.    Colon polyp    awaiting records   Depression    zoloft 127m, remeron 36mper psychiatry. ambien 1033mer psychiatry to help wtih sleep element.    Dysrhythmia    Dystonia    described as psychogenic dystonia. Pain and twisting from upper chest and up with triggers "wind, creamy food" on TID ativan per psychiatry previously.    Fibromyalgia    GERD (gastroesophageal reflux disease)    omeprazole OTC   Goiter    states multiple imaging tests, has had biopsies   Hyperlipidemia    lovastatin 79m4mStroke (HCC)    TIA (left side of face and body decreased sensitivity than right face and side)   TIA (transient ischemic attack)    Vaginal atrophy    estrace vaginal cream   Past Surgical History:  Procedure Laterality Date   ATRIAL FIBRILLATION ABLATION     BIOPSY THYROID     fusion l4-l5  09/22/1998   GLUTEUS MINIMUS REPAIR Right 05/19/2022   Procedure: RIGHT HIP GLUTEUS MEDIUS TENDON REPAIR;  Surgeon: BoksVanetta Mulders;  Location: [facility] OEsteroervice: Orthopedics;  Laterality: Right;   LAMINECTOMY     L4-L5    OTHER SURGICAL HISTORY     tennis elbow surgery   piriformis release  09/22/1997   right hip   TONSILLECTOMY AND ADENOIDECTOMY  age 89   46AGINAL HYSTERECTOMY  09/23/1991   Patient Active Problem List   Diagnosis Date Noted   Tear of right gluteus medius tendon    Sore in nose 07/30/2021   Involuntary movements 02/14/2020   Depression with anxiety 02/14/2020   Aortic atherosclerosis (HCC)[place]/18/2021   Delayed gastric emptying 02/07/2020   Dystonia    Restless legs 07/27/2018   Recurrent UTI 10/07/2016   Major depression in full remission (HCC)[place]/08/2016   GERD (gastroesophageal reflux disease) 01/30/2016   History of SI/intentional drug overdose 11/22/2015   Atrial fibrillation (HCC)    Chronic pain syndrome    Hyperlipidemia     REFERRING PROVIDER: BoksVanetta Mulders  REFERRING DIAG: S76.011A (ICD-10-CM) - Tear of right gluteus medius tendon, initial encounter   THERAPY DIAG:  Stiffness of right hip, not elsewhere classified  Muscle weakness (generalized)  Difficulty in walking, not elsewhere classified  Pain in right hip  Rationale for Evaluation and Treatment Rehabilitation  ONSET DATE: DOS 05/19/2022  SUBJECTIVE:   SUBJECTIVE  Pt is 13 weeks s/p R hip gluteus medius/minimus repair and trochanteric bursectomy.  Pt missed the week before last due to dystonia and last week due to being out of town.  Pt had injection on 11/17 from MD though states it didn't help her pain.  Pt has a bruise at the injection site at proximal scar.  Pt states she had some pain after prior Rx though wasn't terrible.  Pt states her back started hurting 2 days ago and she was unable to stay at church yesterday due to hip and back pain.  Pt states her L hip started bothering her last week.  Pt states she hasn't taken any tylenol or anti-inflammatories.    PERTINENT HISTORY:  -R hip gluteus medius/minimus repair and trochanteric bursectomy on 05/19/2022.  -L4-5 Fusion in 2000, chronic pain  syndrome, Atrial fibrillation, fibromyalgia, Depression, TIA  -R piriformis release in 1999 hip     PAIN:  Are you having pain? Yes NPRS:  4/10      / 3/10 Location:  L hip / lumbar  Aggravating factors factors: standing walking  Relieving factors: rest   PRECAUTIONS: Other: per surgical protocol.  Wb'ing restrictions.  L4-5 Fusion, A-fib  WEIGHT BEARING RESTRICTIONS Yes    FALLS:  Has patient fallen in last 6 months? Yes. Number of falls 1-2 due to medications  LIVING ENVIRONMENT: Lives with: lives with their spouse and lives with their son Lives in: one story home with an attic Stairs: 5 steps with rail to enter front, 4 steps with rail to enter side, and 4 steps, with rails to enter back  Has following equipment at home: Gilford Rile - 2 wheeled and rollator and quad cane  OCCUPATION: pt is retired  PLOF: Independent ; Pt ambulated without an AD.  Pt had increased pain with household chores.  She was limited with ambulation and shopping.   PATIENT GOALS pt wants to be able to walk without pain, gardening, and be out with her family.     OBJECTIVE:   DIAGNOSTIC FINDINGS: pt has had prior x rays and MRIs    TODAY'S TREATMENT:  Pt seen for aquatic therapy today.  Treatment took place in water 3.25-44f [facility] Temp of water was 92.  Pt entered/exited the pool via stairs independently with bilat rail.     Pt ambulated fwd x 3 laps, ambulated bwd x 2 laps, and sidestepped x 2 laps.  Alt HS curls with UE support on wall 2x10 reps Heel raises 2x10 with UE support on wall Marching x 10 reps with UE support on wall SLS 2x20 sec with occasional UE support, 1x20 sec with UE support on yellow noodle Pt ambulated fwd/bwd x 2 laps Standing Hip abd  x 10 reps with UE support on wall. LAQ x2x10 Straddling noodle: cycling x 2 widths   Pt requires the buoyancy and hydrostatic pressure of water for support, and to offload joints by unweighting joint  load by at least 50 % in navel deep water and by at least 75-80% in chest to neck deep water.  Viscosity of the water is needed for resistance of strengthening. Water current perturbations provides challenge to standing balance requiring increased core activation.    PATIENT EDUCATION:  Education details: exercise form, POC, and exercise rationale.  Person educated: Patient Education method: Explanation, Demonstration, and Verbal cues Education comprehension: verbalized understanding, returned demonstration, verbal cues required, and needs further education   HOME EXERCISE PROGRAM: Access Code: [email or web address]  URL: [email or web address] Date: 05/23/2022 Prepared by: Ronny Flurry     ASSESSMENT:  CLINICAL IMPRESSION: Pt presents to PT today for an aquatic Rx.  She has a hx of lumbar pain and reports her back began bothering her 2 days ago though she did have a car ride.  Pt performed aquatic exercises well with cuing and instruction in correct form.  She states she could feel her hip in the water and was fatigued with Rx.  Pt reports increased hip pain from 4/10 before Rx to 5-5.5/10 after Rx with no change in her back pain.  Pt should benefit from cont skilled PT services per protocol to address ongoing goals and to improve overall function.    OBJECTIVE IMPAIRMENTS Abnormal gait, decreased activity tolerance, decreased balance, decreased endurance, decreased mobility, difficulty walking, decreased ROM, decreased strength, hypomobility, impaired flexibility, and pain.   ACTIVITY LIMITATIONS lifting, bending, standing, squatting, stairs, transfers, bed mobility, dressing, and locomotion level  PARTICIPATION LIMITATIONS: cleaning, laundry, driving, shopping, community activity, and gardening  PERSONAL FACTORS Time since onset of injury/illness/exacerbation and 3+ comorbidities: L4-5 Fusion in 2000, chronic pain syndrome, Atrial fibrillation, fibromyalgia, Depression   are also  affecting patient's functional outcome.   REHAB POTENTIAL: Good  CLINICAL DECISION MAKING: Stable/uncomplicated  EVALUATION COMPLEXITY: Low   GOALS:  SHORT TERM GOALS: Target date: 06/20/2022  Pt will be independent and compliant with HEP for improved pain, strength, and function Baseline: Goal status: GOAL MET  2.  Pt will progress with exercises per protocol without adverse effects to improve strength and mobility.   Baseline:  Goal status: Ongoing  Target Date:  07/04/2022   3.  Pt will progress with PROM per protocol without adverse effects for improved stiffness and mobility.  Baseline:  Goal status: MET -07/24/22 per pt report  Target date:  07/18/2022  4.  Pt will progress Wb'ing and appropriately wean AD per MD orders or protocol. Baseline: currently without AD in house, [facility] in community - 07/24/22 Goal status:Ongoing Target date:  07/04/2022  5.  Pt will be able to perform a 6 inch step up with good form and control with L LE leading for improved performance of stairs abd and improved functional strength. Baseline: has completed this in the water without difficulty  Goal status: Partially Met - 07/24/22 Target date:  08/15/2022   6.  Pt will report improved tolerance with standing and performing household chores.  Baseline:  Goal status: GOAL MET Target date:  08/15/2022  LONG TERM GOALS: Target date: 09/25/2021   Pt will ambulate with a normalized heel to toe gait without limping Baseline:  Goal status: ONGOING  2.  Pt will ambulate extended community distance without an AD without significant pain and difficulty.  Baseline:  Goal status: ONGOING  3.  Pt will be able to perform her normal standing activities and household chores without significant pain.  Baseline:  Goal status: ONGOING  4.    Pt will demo at least 4/5 hip flexion, 4-/5 hip abd, and 5/5 knee extension strength for improved performance of and tolerance with functional mobility.   Baseline:  Goal status: ONGOING  5.  Pt will be able to perform stairs with a reciprocal gait with a rail with good control.  Baseline:  Goal status: Ongoing      PLAN: PT FREQUENCY:  2x/wk  PT DURATION: other: 8 weeks  PLANNED INTERVENTIONS: Therapeutic exercises, Therapeutic activity, Neuromuscular re-education, Balance training, Gait training, Patient/Family education, Self Care, Joint mobilization, Stair training, DME  instructions, Aquatic Therapy, Dry Needling, Cryotherapy, Moist heat, scar mobilization, Taping, Ultrasound, Manual therapy, and Re-evaluation  PLAN FOR NEXT SESSION: Cont per Dr. Eddie Dibbles gluteus medius repair protocol.  Pt to continue with 1 aquatic and 1 land based PT each week.    Selinda Michaels III PT, DPT 08/18/22 11:47 AM

## 2022-08-18 ENCOUNTER — Ambulatory Visit: Payer: Self-pay

## 2022-08-18 ENCOUNTER — Other Ambulatory Visit: Payer: Self-pay | Admitting: Physical Medicine and Rehabilitation

## 2022-08-18 ENCOUNTER — Ambulatory Visit: Payer: Medicare HMO | Admitting: Physical Medicine and Rehabilitation

## 2022-08-18 ENCOUNTER — Ambulatory Visit (HOSPITAL_BASED_OUTPATIENT_CLINIC_OR_DEPARTMENT_OTHER): Payer: Medicare HMO | Admitting: Physical Therapy

## 2022-08-18 ENCOUNTER — Other Ambulatory Visit: Payer: Self-pay | Admitting: Family Medicine

## 2022-08-18 ENCOUNTER — Encounter (HOSPITAL_BASED_OUTPATIENT_CLINIC_OR_DEPARTMENT_OTHER): Payer: Self-pay | Admitting: Physical Therapy

## 2022-08-18 DIAGNOSIS — M6281 Muscle weakness (generalized): Secondary | ICD-10-CM

## 2022-08-18 DIAGNOSIS — R262 Difficulty in walking, not elsewhere classified: Secondary | ICD-10-CM | POA: Diagnosis not present

## 2022-08-18 DIAGNOSIS — M25551 Pain in right hip: Secondary | ICD-10-CM | POA: Diagnosis not present

## 2022-08-18 DIAGNOSIS — M5412 Radiculopathy, cervical region: Secondary | ICD-10-CM

## 2022-08-18 DIAGNOSIS — M25552 Pain in left hip: Secondary | ICD-10-CM | POA: Diagnosis not present

## 2022-08-18 DIAGNOSIS — M25651 Stiffness of right hip, not elsewhere classified: Secondary | ICD-10-CM

## 2022-08-18 DIAGNOSIS — F411 Generalized anxiety disorder: Secondary | ICD-10-CM

## 2022-08-18 MED ORDER — BETAMETHASONE SOD PHOS & ACET 6 (3-3) MG/ML IJ SUSP
12.0000 mg | Freq: Once | INTRAMUSCULAR | Status: AC
  Administered 2022-08-18: 12 mg

## 2022-08-18 MED ORDER — DIAZEPAM 5 MG PO TABS: ORAL_TABLET | ORAL | 0 refills | Status: DC

## 2022-08-18 NOTE — Patient Instructions (Signed)

## 2022-08-18 NOTE — Progress Notes (Signed)
Danielle Rocha - 71 y.o. female MRN [id number]  Date of birth: 1951/04/16  Office Visit Note: Visit Date: 08/18/2022 PCP: Shelva Majestic, MD Referred by: Shelva Majestic, MD  Subjective: Chief Complaint  Patient presents with   Neck - Pain   HPI:  Danielle Rocha is a 71 y.o. female who comes in today at the request of Ellin Goodie, FNP for planned Right C7-T1 Cervical Interlaminar epidural steroid injection with fluoroscopic guidance.  The patient has failed conservative care including home exercise, medications, time and activity modification.  This injection will be diagnostic and hopefully therapeutic.  Please see requesting physician notes for further details and justification.  She will resume Xarelto tomorrow.   ROS Otherwise per HPI.  Assessment & Plan: Visit Diagnoses:    ICD-10-CM   1. Cervical radiculopathy  M54.12 XR C-ARM NO REPORT    Epidural Steroid injection    betamethasone acetate-betamethasone sodium phosphate (CELESTONE) injection 12 mg      Plan: No additional findings.   Meds & Orders:  Meds ordered this encounter  Medications   betamethasone acetate-betamethasone sodium phosphate (CELESTONE) injection 12 mg    Orders Placed This Encounter  Procedures   XR C-ARM NO REPORT   Epidural Steroid injection    Follow-up: Return for visit to requesting provider as needed.   Procedures: No procedures performed  Cervical Epidural Steroid Injection - Interlaminar Approach with Fluoroscopic Guidance  Patient: Danielle Rocha      Date of Birth: November 28, 1950 MRN: [id number] PCP: Shelva Majestic, MD      Visit Date: 08/18/2022   Universal Protocol:    Date/Time: 11/27/238:06 PM  Consent Given By: the patient  Position: PRONE  Additional Comments: Vital signs were monitored before and after the procedure. Patient was prepped and draped in the usual sterile fashion. The correct patient, procedure, and site was verified.   Injection Procedure  Details:   Procedure diagnoses: Cervical radiculopathy [M54.12]    Meds Administered:  Meds ordered this encounter  Medications   betamethasone acetate-betamethasone sodium phosphate (CELESTONE) injection 12 mg     Laterality: Right  Location/Site: C7-T1  Needle: 3.5 in., 20 ga. Tuohy  Needle Placement: Paramedian epidural space  Findings:  -Comments: Excellent flow of contrast into the epidural space.  Procedure Details: Using a paramedian approach from the side mentioned above, the region overlying the inferior lamina was localized under fluoroscopic visualization and the soft tissues overlying this structure were infiltrated with 4 ml. of 1% Lidocaine without Epinephrine. A # 20 gauge, Tuohy needle was inserted into the epidural space using a paramedian approach.  The epidural space was localized using loss of resistance along with contralateral oblique bi-planar fluoroscopic views.  After negative aspirate for air, blood, and CSF, a 2 ml. volume of Isovue-250 was injected into the epidural space and the flow of contrast was observed. Radiographs were obtained for documentation purposes.   The injectate was administered into the level noted above.  Additional Comments:  The patient tolerated the procedure well Dressing: 2 x 2 sterile gauze and Band-Aid    Post-procedure details: Patient was observed during the procedure. Post-procedure instructions were reviewed.  Patient left the clinic in stable condition.   Clinical History: EXAM: MRI CERVICAL SPINE WITHOUT CONTRAST   TECHNIQUE: Multiplanar, multisequence MR imaging of the cervical spine was performed. No intravenous contrast was administered.   COMPARISON:  01/05/2022 brain MRI   FINDINGS: Alignment: Mild anterolisthesis at C2-3.   Vertebrae: No fracture, evidence of discitis,  or bone lesion.   Cord: Normal signal and morphology.   Posterior Fossa, vertebral arteries, paraspinal tissues: Negative.    Disc levels:   C2-3: Slight anterolisthesis although no significant degenerative changes noted   C3-4: Uncovertebral ridging asymmetric to the left where there is moderate foraminal stenosis.   C4-5: Facet spurring and left uncovertebral ridging with mild left foraminal narrowing.   C5-6: Disc narrowing with bulging and uncovertebral ridging. Mild left foraminal narrowing   C6-7: Degenerative facet spurring asymmetric to the left. Mild left uncovertebral ridging. Moderate left foraminal narrowing. Right foraminal root sleeve cyst.   C7-T1:Unremarkable.   IMPRESSION: 1. Cervical spine degeneration causing moderate foraminal stenosis on the left at C3-4 and C6-7. 2. No inflammation or impingement seen at the skull base to explain radiation to the head.     Electronically Signed   By: Tiburcio Pea M.D.   On: 04/10/2022 17:05     Objective:  VS:  HT:    WT:   BMI:     BP:   HR: bpm  TEMP: ( )  RESP:  Physical Exam Vitals and nursing note reviewed.  Constitutional:      General: She is not in acute distress.    Appearance: Normal appearance. She is not ill-appearing.  HENT:     Head: Normocephalic and atraumatic.     Right Ear: External ear normal.     Left Ear: External ear normal.  Eyes:     Extraocular Movements: Extraocular movements intact.  Cardiovascular:     Rate and Rhythm: Normal rate.     Pulses: Normal pulses.  Musculoskeletal:     Cervical back: Tenderness present. No rigidity.     Right lower leg: No edema.     Left lower leg: No edema.     Comments: Patient has good strength in the upper extremities including 5 out of 5 strength in wrist extension long finger flexion and APB.  There is no atrophy of the hands intrinsically.  There is a negative Hoffmann's test.   Lymphadenopathy:     Cervical: No cervical adenopathy.  Skin:    Findings: No erythema, lesion or rash.  Neurological:     General: No focal deficit present.     Mental Status:  She is alert and oriented to person, place, and time.     Sensory: No sensory deficit.     Motor: No weakness or abnormal muscle tone.     Coordination: Coordination normal.  Psychiatric:        Mood and Affect: Mood normal.        Behavior: Behavior normal.      Imaging: XR C-ARM NO REPORT  Result Date: 08/18/2022 Please see Notes tab for imaging impression.

## 2022-08-18 NOTE — Progress Notes (Signed)
Numeric Pain Rating Scale and Functional Assessment Average Pain 4   In the last MONTH (on 0-10 scale) has pain interfered with the following?  1. General activity like being  able to carry out your everyday physical activities such as walking, climbing stairs, carrying groceries, or moving a chair?  Rating(4)  Neck pain keeps her up at night.   Not so bad today. +Driver, -BT, -Dye Allergies.

## 2022-08-18 NOTE — Progress Notes (Signed)
Pre-procedure diazepam ordered for pre-operative anxiety.  

## 2022-08-18 NOTE — Procedures (Signed)
Cervical Epidural Steroid Injection - Interlaminar Approach with Fluoroscopic Guidance  Patient: Danielle Rocha      Date of Birth: 09-16-1951 MRN: [id number] PCP: Shelva Majestic, MD      Visit Date: 08/18/2022   Universal Protocol:    Date/Time: 11/27/238:06 PM  Consent Given By: the patient  Position: PRONE  Additional Comments: Vital signs were monitored before and after the procedure. Patient was prepped and draped in the usual sterile fashion. The correct patient, procedure, and site was verified.   Injection Procedure Details:   Procedure diagnoses: Cervical radiculopathy [M54.12]    Meds Administered:  Meds ordered this encounter  Medications   betamethasone acetate-betamethasone sodium phosphate (CELESTONE) injection 12 mg     Laterality: Right  Location/Site: C7-T1  Needle: 3.5 in., 20 ga. Tuohy  Needle Placement: Paramedian epidural space  Findings:  -Comments: Excellent flow of contrast into the epidural space.  Procedure Details: Using a paramedian approach from the side mentioned above, the region overlying the inferior lamina was localized under fluoroscopic visualization and the soft tissues overlying this structure were infiltrated with 4 ml. of 1% Lidocaine without Epinephrine. A # 20 gauge, Tuohy needle was inserted into the epidural space using a paramedian approach.  The epidural space was localized using loss of resistance along with contralateral oblique bi-planar fluoroscopic views.  After negative aspirate for air, blood, and CSF, a 2 ml. volume of Isovue-250 was injected into the epidural space and the flow of contrast was observed. Radiographs were obtained for documentation purposes.   The injectate was administered into the level noted above.  Additional Comments:  The patient tolerated the procedure well Dressing: 2 x 2 sterile gauze and Band-Aid    Post-procedure details: Patient was observed during the procedure. Post-procedure  instructions were reviewed.  Patient left the clinic in stable condition.

## 2022-08-20 ENCOUNTER — Ambulatory Visit (HOSPITAL_BASED_OUTPATIENT_CLINIC_OR_DEPARTMENT_OTHER): Payer: Medicare HMO | Admitting: Physical Therapy

## 2022-08-20 DIAGNOSIS — M25651 Stiffness of right hip, not elsewhere classified: Secondary | ICD-10-CM | POA: Diagnosis not present

## 2022-08-20 DIAGNOSIS — R262 Difficulty in walking, not elsewhere classified: Secondary | ICD-10-CM

## 2022-08-20 DIAGNOSIS — M25552 Pain in left hip: Secondary | ICD-10-CM | POA: Diagnosis not present

## 2022-08-20 DIAGNOSIS — M25551 Pain in right hip: Secondary | ICD-10-CM

## 2022-08-20 DIAGNOSIS — M6281 Muscle weakness (generalized): Secondary | ICD-10-CM | POA: Diagnosis not present

## 2022-08-20 NOTE — Therapy (Signed)
OUTPATIENT PHYSICAL THERAPY LOWER EXTREMITY TREATMENT   Patient Name: Danielle Rocha MRN: [id number] DOB:1951-06-26, 71 y.o., female Today's Date: 08/21/2022   PT End of Session - 08/20/22 0857     Visit Number 12    Number of Visits 26    Date for PT Re-Evaluation 09/25/22    Authorization Type [employer] MCR    PT Start Time 3546    PT Stop Time 0927    PT Time Calculation (min) 35 min    Activity Tolerance Patient tolerated treatment well    Behavior During Therapy WFL for tasks assessed/performed                    Past Medical History:  Diagnosis Date   Anemia    Arthritis    DJD, low back, thumb   Atrial fibrillation (HCC)    Xarelto anticoagulation. Flecainide antiarrythmic    Chicken pox    Chronic pain syndrome    On disability. History of bilateral hip pain, low back pain. Gabapentin 400 BID, percocet 1 tablet daily per prior provider.    Colon polyp    awaiting records   Depression    zoloft 154m, remeron 374mper psychiatry. ambien 1017mer psychiatry to help wtih sleep element.    Dysrhythmia    Dystonia    described as psychogenic dystonia. Pain and twisting from upper chest and up with triggers "wind, creamy food" on TID ativan per psychiatry previously.    Fibromyalgia    GERD (gastroesophageal reflux disease)    omeprazole OTC   Goiter    states multiple imaging tests, has had biopsies   Hyperlipidemia    lovastatin 72m43mStroke (HCC)    TIA (left side of face and body decreased sensitivity than right face and side)   TIA (transient ischemic attack)    Vaginal atrophy    estrace vaginal cream   Past Surgical History:  Procedure Laterality Date   ATRIAL FIBRILLATION ABLATION     BIOPSY THYROID     fusion l4-l5  09/22/1998   GLUTEUS MINIMUS REPAIR Right 05/19/2022   Procedure: RIGHT HIP GLUTEUS MEDIUS TENDON REPAIR;  Surgeon: BoksVanetta Mulders;  [facility] [place]  Laterality: Right;   LAMINECTOMY     L4-L5    OTHER SURGICAL HISTORY     tennis elbow surgery   piriformis release  09/22/1997   right hip   TONSILLECTOMY AND ADENOIDECTOMY  age 74   54AGINAL HYSTERECTOMY  09/23/1991   Patient Active Problem List   Diagnosis Date Noted   Tear of right gluteus medius tendon    Sore in nose 07/30/2021   Involuntary movements 02/14/2020   Depression with anxiety 02/14/2020   Aortic atherosclerosis (HCC)[place]/18/2021   Delayed gastric emptying 02/07/2020   Dystonia    Restless legs 07/27/2018   Recurrent UTI 10/07/2016   Major depression in full remission (HCC)[place]/08/2016   GERD (gastroesophageal reflux disease) 01/30/2016   History of SI/intentional drug overdose 11/22/2015   Atrial fibrillation (HCC)    Chronic pain syndrome    Hyperlipidemia     REFERRING PROVIDER: BoksVanetta Mulders  REFERRING DIAG: S76.011A (ICD-10-CM) - Tear of right gluteus medius tendon, initial encounter   THERAPY DIAG:  Stiffness of right hip, not elsewhere classified  Muscle weakness (generalized)  Difficulty in walking, not elsewhere classified  Pain in right hip  Rationale for Evaluation and Treatment Rehabilitation  ONSET DATE: DOS 05/19/2022  SUBJECTIVE:   SUBJECTIVE  Pt is 13 weeks and 2 days s/p R hip gluteus medius/minimus repair and trochanteric bursectomy.  Pt states she felt ok after prior Rx.  Pt didn't have much soreness and had possible 1/2 pt of pain increase after prior Rx.  She had a cervical ESI later that day.  Pt states "I feel great".  Pt states today is the best her hip has felt since the surgery.    PERTINENT HISTORY:  -R hip gluteus medius/minimus repair and trochanteric bursectomy on 05/19/2022.  -L4-5 Fusion in 2000, chronic pain syndrome, Atrial fibrillation, fibromyalgia, Depression, TIA  -R piriformis release in 1999 hip     PAIN:  Are you having pain? Yes NPRS:  "not quite a 3/10" / 1.5/10 Location:  L hip / lumbar  Aggravating factors factors: standing walking  Relieving  factors: rest   PRECAUTIONS: Other: per surgical protocol.  Wb'ing restrictions.  L4-5 Fusion, A-fib  WEIGHT BEARING RESTRICTIONS Yes    FALLS:  Has patient fallen in last 6 months? Yes. Number of falls 1-2 due to medications  LIVING ENVIRONMENT: Lives with: lives with their spouse and lives with their son Lives in: one story home with an attic Stairs: 5 steps with rail to enter front, 4 steps with rail to enter side, and 4 steps, with rails to enter back  Has following equipment at home: Gilford Rile - 2 wheeled and rollator and quad cane  OCCUPATION: pt is retired  PLOF: Independent ; Pt ambulated without an AD.  Pt had increased pain with household chores.  She was limited with ambulation and shopping.   PATIENT GOALS pt wants to be able to walk without pain, gardening, and be out with her family.     OBJECTIVE:   DIAGNOSTIC FINDINGS: pt has had prior x rays and MRIs    TODAY'S TREATMENT:   -Pt performed:           LAQ x 10 with 1#, 2 x 10 with 2#  Seated HS curl with YTB 2x10 Seated hip abd isometrics with 5 sec hold x 10 reps          Supine hip abd 2x10 reps Stool rotations IR/ER 2x10   Standing heel raises 2x10  Standing on airex with NBOS 2x30 sec  Low level marching on airex with UE support    PT updated HEP and gave pt a HEP handout.  Educated pt in correct form and appropriate frequency.      PATIENT EDUCATION:  Education details: HEP, exercise form, POC, and exercise rationale.  Person educated: Patient Education method: Explanation, Demonstration, and Verbal cues, handout Education comprehension: verbalized understanding, returned demonstration, verbal cues required, and needs further education   HOME EXERCISE PROGRAM: Access Code: [email or web address] URL: [email or web address] Date: 05/23/2022 Prepared by: Ronny Flurry  - Supine Hip Abduction  - 1 x daily - 7 x weekly - 2 sets - 10 reps - Heel Raises with Counter Support  - 1 x daily - 5-7 x  weekly - 2 sets - 10 reps -Stool rotations 1x/day 2 sets of 10 reps     ASSESSMENT:  CLINICAL IMPRESSION: Pt presents to PT today for a land based treatment.  She did not bring her cane.  She had a cervical ESI on Monday and is feeling much better today.  PT slowly progressed exercises per protocol and pt performed exercises well with cuing and instruction in correct form.  PT updated HEP and pt demonstrates good understanding of HEP.  She responded well to  Rx and was fatigued after Rx.  Pt should benefit from cont skilled PT services per protocol to address ongoing goals and to improve overall function.    OBJECTIVE IMPAIRMENTS Abnormal gait, decreased activity tolerance, decreased balance, decreased endurance, decreased mobility, difficulty walking, decreased ROM, decreased strength, hypomobility, impaired flexibility, and pain.   ACTIVITY LIMITATIONS lifting, bending, standing, squatting, stairs, transfers, bed mobility, dressing, and locomotion level  PARTICIPATION LIMITATIONS: cleaning, laundry, driving, shopping, community activity, and gardening  PERSONAL FACTORS Time since onset of injury/illness/exacerbation and 3+ comorbidities: L4-5 Fusion in 2000, chronic pain syndrome, Atrial fibrillation, fibromyalgia, Depression   are also affecting patient's functional outcome.   REHAB POTENTIAL: Good  CLINICAL DECISION MAKING: Stable/uncomplicated  EVALUATION COMPLEXITY: Low   GOALS:  SHORT TERM GOALS: Target date: 06/20/2022  Pt will be independent and compliant with HEP for improved pain, strength, and function Baseline: Goal status: GOAL MET  2.  Pt will progress with exercises per protocol without adverse effects to improve strength and mobility.   Baseline:  Goal status: Ongoing  Target Date:  07/04/2022   3.  Pt will progress with PROM per protocol without adverse effects for improved stiffness and mobility.  Baseline:  Goal status: MET -07/24/22 per pt report  Target  date:  07/18/2022  4.  Pt will progress Wb'ing and appropriately wean AD per MD orders or protocol. Baseline: currently without AD in house, [facility] in community - 07/24/22 Goal status:Ongoing Target date:  07/04/2022  5.  Pt will be able to perform a 6 inch step up with good form and control with L LE leading for improved performance of stairs abd and improved functional strength. Baseline: has completed this in the water without difficulty  Goal status: Partially Met - 07/24/22 Target date:  08/15/2022   6.  Pt will report improved tolerance with standing and performing household chores.  Baseline:  Goal status: GOAL MET Target date:  08/15/2022  LONG TERM GOALS: Target date: 09/25/2021   Pt will ambulate with a normalized heel to toe gait without limping Baseline:  Goal status: ONGOING  2.  Pt will ambulate extended community distance without an AD without significant pain and difficulty.  Baseline:  Goal status: ONGOING  3.  Pt will be able to perform her normal standing activities and household chores without significant pain.  Baseline:  Goal status: ONGOING  4.    Pt will demo at least 4/5 hip flexion, 4-/5 hip abd, and 5/5 knee extension strength for improved performance of and tolerance with functional mobility.  Baseline:  Goal status: ONGOING  5.  Pt will be able to perform stairs with a reciprocal gait with a rail with good control.  Baseline:  Goal status: Ongoing      PLAN: PT FREQUENCY:  2x/wk  PT DURATION: other: 8 weeks  PLANNED INTERVENTIONS: Therapeutic exercises, Therapeutic activity, Neuromuscular re-education, Balance training, Gait training, Patient/Family education, Self Care, Joint mobilization, Stair training, DME instructions, Aquatic Therapy, Dry Needling, Cryotherapy, Moist heat, scar mobilization, Taping, Ultrasound, Manual therapy, and Re-evaluation  PLAN FOR NEXT SESSION: Cont per Dr. Eddie Dibbles gluteus medius repair protocol.  Pt to continue  with 1 aquatic and 1 land based PT each week.    Selinda Michaels III PT, DPT Selinda Michaels III PT, DPT 08/21/22 11:39 AM

## 2022-08-21 ENCOUNTER — Encounter (HOSPITAL_BASED_OUTPATIENT_CLINIC_OR_DEPARTMENT_OTHER): Payer: Self-pay | Admitting: Physical Therapy

## 2022-08-25 ENCOUNTER — Other Ambulatory Visit (HOSPITAL_COMMUNITY): Payer: Self-pay

## 2022-08-25 ENCOUNTER — Ambulatory Visit (HOSPITAL_BASED_OUTPATIENT_CLINIC_OR_DEPARTMENT_OTHER): Payer: Medicare HMO | Attending: Orthopaedic Surgery | Admitting: Physical Therapy

## 2022-08-25 ENCOUNTER — Telehealth: Payer: Self-pay

## 2022-08-25 ENCOUNTER — Encounter (HOSPITAL_BASED_OUTPATIENT_CLINIC_OR_DEPARTMENT_OTHER): Payer: Self-pay | Admitting: Physical Therapy

## 2022-08-25 DIAGNOSIS — M6281 Muscle weakness (generalized): Secondary | ICD-10-CM | POA: Diagnosis present

## 2022-08-25 DIAGNOSIS — M25651 Stiffness of right hip, not elsewhere classified: Secondary | ICD-10-CM | POA: Diagnosis present

## 2022-08-25 DIAGNOSIS — M25552 Pain in left hip: Secondary | ICD-10-CM | POA: Insufficient documentation

## 2022-08-25 DIAGNOSIS — R262 Difficulty in walking, not elsewhere classified: Secondary | ICD-10-CM

## 2022-08-25 DIAGNOSIS — M25551 Pain in right hip: Secondary | ICD-10-CM | POA: Diagnosis present

## 2022-08-25 NOTE — Telephone Encounter (Signed)
Patient Advocate Encounter   Received notification from [facility] that prior authorization is required for Aimovig 140MG /ML auto-injectors  Submitted: 08-25-2022 Key [id number]  Status is pending

## 2022-08-25 NOTE — Therapy (Signed)
OUTPATIENT PHYSICAL THERAPY LOWER EXTREMITY TREATMENT   Patient Name: Danielle Rocha MRN: [id number] DOB:[birth year], 71 y.o., female Today's Date: 08/25/2022   PT End of Session - 08/25/22 0932     Visit Number 13    Number of Visits 26    Date for PT Re-Evaluation 09/25/22    Authorization Type AET[employer] MCR    PT Start Time 0850    PT Stop Time 0928    PT Time Calculation (min) 38 min    Activity Tolerance Patient tolerated treatment well    Behavior During Therapy WFL for tasks assessed/performed                     Past Medical History:  Diagnosis Date   Anemia    Arthritis    DJD, low back, thumb   Atrial fibrillation (HCC)    Xarelto anticoagulation. Flecainide antiarrythmic    Chicken pox    Chronic pain syndrome    On disability. History of bilateral hip pain, low back pain. Gabapentin 400 BID, percocet 1 tablet daily per prior provider.    Colon polyp    awaiting records   Depression    zoloft 173m, remeron 338mper psychiatry. ambien 1072mer psychiatry to help wtih sleep element.    Dysrhythmia    Dystonia    described as psychogenic dystonia. Pain and twisting from upper chest and up with triggers "wind, creamy food" on TID ativan per psychiatry previously.    Fibromyalgia    GERD (gastroesophageal reflux disease)    omeprazole OTC   Goiter    states multiple imaging tests, has had biopsies   Hyperlipidemia    lovastatin 84m29mStroke (HCC)    TIA (left side of face and body decreased sensitivity than right face and side)   TIA (transient ischemic attack)    Vaginal atrophy    estrace vaginal cream   Past Surgical History:  Procedure Laterality Date   ATRIAL FIBRILLATION ABLATION     BIOPSY THYROID     fusion l4-l5  09/22/1998   GLUTEUS MINIMUS REPAIR Right 05/19/2022   Procedure: RIGHT HIP GLUTEUS MEDIUS TENDON REPAIR;  Surgeon: BoksVanetta Mulders;  [facility];  Laterality: Right;   LAMINECTOMY     L4-L5    OTHER SURGICAL HISTORY     tennis elbow surgery   piriformis release  09/22/1997   right hip   TONSILLECTOMY AND ADENOIDECTOMY  age 16   10AGINAL HYSTERECTOMY  09/23/1991   Patient Active Problem List   Diagnosis Date Noted   Tear of right gluteus medius tendon    Sore in nose 07/30/2021   Involuntary movements 02/14/2020   Depression with anxiety 02/14/2020   Aortic atherosclerosis (HCC)[address]/18/2021   Delayed gastric emptying 02/07/2020   Dystonia    Restless legs 07/27/2018   Recurrent UTI 10/07/2016   Major depression in full remission (HCC)[place]/08/2016   GERD (gastroesophageal reflux disease) 01/30/2016   History of SI/intentional drug overdose 11/22/2015   Atrial fibrillation (HCC)    Chronic pain syndrome    Hyperlipidemia     REFERRING PROVIDER: BoksVanetta Mulders  REFERRING DIAG: S76.011A (ICD-10-CM) - Tear of right gluteus medius tendon, initial encounter   THERAPY DIAG:  Stiffness of right hip, not elsewhere classified  Muscle weakness (generalized)  Difficulty in walking, not elsewhere classified  Pain in right hip  Rationale for Evaluation and Treatment Rehabilitation  ONSET DATE: DOS 05/19/2022  SUBJECTIVE:  SUBJECTIVE   Pt is 14 weeks s/p R hip gluteus medius/minimus repair and trochanteric bursectomy.  Pt states she is doing very good.  Pt has increased pain with the longer she sits or increased ambulation including a long walk in parking lot or shopping.  Pt states she had no increased pain after prior Rx, just some soreness.  Pt is not taking any pain meds and is not taking tylenol.  Pt reports compliance with HEP.     PERTINENT HISTORY:  -R hip gluteus medius/minimus repair and trochanteric bursectomy on 05/19/2022.  -L4-5 Fusion in 2000, chronic pain syndrome, Atrial fibrillation, fibromyalgia, Depression, TIA  -R piriformis release in 1999 hip     PAIN:  Are you having pain? Yes NPRS:  1.5/10 Location:  R hip  Aggravating factors factors:  standing walking  Relieving factors: rest   PRECAUTIONS: Other: per surgical protocol.  Wb'ing restrictions.  L4-5 Fusion, A-fib  WEIGHT BEARING RESTRICTIONS Yes    FALLS:  Has patient fallen in last 6 months? Yes. Number of falls 1-2 due to medications  LIVING ENVIRONMENT: Lives with: lives with their spouse and lives with their son Lives in: one story home with an attic Stairs: 5 steps with rail to enter front, 4 steps with rail to enter side, and 4 steps, with rails to enter back  Has following equipment at home: Gilford Rile - 2 wheeled and rollator and quad cane  OCCUPATION: pt is retired  PLOF: Independent ; Pt ambulated without an AD.  Pt had increased pain with household chores.  She was limited with ambulation and shopping.   PATIENT GOALS pt wants to be able to walk without pain, gardening, and be out with her family.     OBJECTIVE:   DIAGNOSTIC FINDINGS: pt has had prior x rays and MRIs    TODAY'S TREATMENT:   Therapeutic Exercise: -Reviewed response to prior Rx, current function, pain level, and HEP compliance. -Pt performed:           LAQ x 10 with 3 x 10 with 2#  Seated HS curl with YTB 2x10          Supine hip abd 2x10 reps Standing hip abduction 2x10 on R , 1x10 on L Stool rotations IR/ER 2x10   Standing heel raises 2x10  Standing on airex with NBOS 2x30 sec  Therapeutic Activity: Low level marching on floor x 10 and x 10 on airex with UE support  Standing hip hiking with bilat UE support 2x10 on R, 1x10 on L  Step ups 2x10 on 4 inch step      PATIENT EDUCATION:  Education details: HEP, exercise form, POC, and exercise rationale.  Person educated: Patient Education method: Explanation, Demonstration, and Verbal cues, handout Education comprehension: verbalized understanding, returned demonstration, verbal cues required, and needs further education   HOME EXERCISE PROGRAM: Access Code: [email or web address] URL: [email or web address] Date:  05/23/2022 Prepared by: Ronny Flurry  - Supine Hip Abduction  - 1 x daily - 7 x weekly - 2 sets - 10 reps - Heel Raises with Counter Support  - 1 x daily - 5-7 x weekly - 2 sets - 10 reps -Stool rotations 1x/day 2 sets of 10 reps     ASSESSMENT:  CLINICAL IMPRESSION: Pt seems to be doing better overall including her pain level as evidenced by subjective reports.  Pt presented to Rx again without her cane.  Pt tolerated prior land based Rx well and is tolerating home exercises well also.  PT  progressed exercises per protocol and pt performed exercises well with instruction and cuing for correct form.  Pt did report some irritation in her hip with exercises and report increased pain from 1.5/10 before Rx to 3.5/10 after Rx.  Pt should benefit from cont skilled PT services per protocol to address ongoing goals and to improve overall function.     OBJECTIVE IMPAIRMENTS Abnormal gait, decreased activity tolerance, decreased balance, decreased endurance, decreased mobility, difficulty walking, decreased ROM, decreased strength, hypomobility, impaired flexibility, and pain.   ACTIVITY LIMITATIONS lifting, bending, standing, squatting, stairs, transfers, bed mobility, dressing, and locomotion level  PARTICIPATION LIMITATIONS: cleaning, laundry, driving, shopping, community activity, and gardening  PERSONAL FACTORS Time since onset of injury/illness/exacerbation and 3+ comorbidities: L4-5 Fusion in 2000, chronic pain syndrome, Atrial fibrillation, fibromyalgia, Depression   are also affecting patient's functional outcome.   REHAB POTENTIAL: Good  CLINICAL DECISION MAKING: Stable/uncomplicated  EVALUATION COMPLEXITY: Low   GOALS:  SHORT TERM GOALS: Target date: 06/20/2022  Pt will be independent and compliant with HEP for improved pain, strength, and function Baseline: Goal status: GOAL MET  2.  Pt will progress with exercises per protocol without adverse effects to improve strength and  mobility.   Baseline:  Goal status: Ongoing  Target Date:  07/04/2022   3.  Pt will progress with PROM per protocol without adverse effects for improved stiffness and mobility.  Baseline:  Goal status: MET -07/24/22 per pt report  Target date:  07/18/2022  4.  Pt will progress Wb'ing and appropriately wean AD per MD orders or protocol. Baseline: currently without AD in house, [facility] in community - 07/24/22 Goal status:Ongoing Target date:  07/04/2022  5.  Pt will be able to perform a 6 inch step up with good form and control with L LE leading for improved performance of stairs abd and improved functional strength. Baseline: has completed this in the water without difficulty  Goal status: Partially Met - 07/24/22 Target date:  08/15/2022   6.  Pt will report improved tolerance with standing and performing household chores.  Baseline:  Goal status: GOAL MET Target date:  08/15/2022  LONG TERM GOALS: Target date: 09/25/2021   Pt will ambulate with a normalized heel to toe gait without limping Baseline:  Goal status: ONGOING  2.  Pt will ambulate extended community distance without an AD without significant pain and difficulty.  Baseline:  Goal status: ONGOING  3.  Pt will be able to perform her normal standing activities and household chores without significant pain.  Baseline:  Goal status: ONGOING  4.    Pt will demo at least 4/5 hip flexion, 4-/5 hip abd, and 5/5 knee extension strength for improved performance of and tolerance with functional mobility.  Baseline:  Goal status: ONGOING  5.  Pt will be able to perform stairs with a reciprocal gait with a rail with good control.  Baseline:  Goal status: Ongoing      PLAN: PT FREQUENCY:  2x/wk  PT DURATION: other: 8 weeks  PLANNED INTERVENTIONS: Therapeutic exercises, Therapeutic activity, Neuromuscular re-education, Balance training, Gait training, Patient/Family education, Self Care, Joint mobilization, Stair  training, DME instructions, Aquatic Therapy, Dry Needling, Cryotherapy, Moist heat, scar mobilization, Taping, Ultrasound, Manual therapy, and Re-evaluation  PLAN FOR NEXT SESSION: Cont per Dr. Eddie Dibbles gluteus medius repair protocol.  Pt to continue with 1 aquatic and 1 land based PT each week.    Selinda Michaels III PT, DPT 08/25/22 11:27 AM

## 2022-08-26 NOTE — Telephone Encounter (Signed)
Patient Advocate Encounter  Prior Authorization for Aimovig 140MG /ML auto-injectors has been approved.     Effective:  08-26-2022 to 11-23-2022

## 2022-08-27 ENCOUNTER — Ambulatory Visit (HOSPITAL_BASED_OUTPATIENT_CLINIC_OR_DEPARTMENT_OTHER): Payer: Medicare HMO | Admitting: Physical Therapy

## 2022-08-27 ENCOUNTER — Encounter (HOSPITAL_BASED_OUTPATIENT_CLINIC_OR_DEPARTMENT_OTHER): Payer: Self-pay | Admitting: Physical Therapy

## 2022-08-27 DIAGNOSIS — M25651 Stiffness of right hip, not elsewhere classified: Secondary | ICD-10-CM | POA: Diagnosis not present

## 2022-08-27 DIAGNOSIS — M6281 Muscle weakness (generalized): Secondary | ICD-10-CM

## 2022-08-27 DIAGNOSIS — R262 Difficulty in walking, not elsewhere classified: Secondary | ICD-10-CM

## 2022-08-27 DIAGNOSIS — M25552 Pain in left hip: Secondary | ICD-10-CM | POA: Diagnosis not present

## 2022-08-27 DIAGNOSIS — M25551 Pain in right hip: Secondary | ICD-10-CM | POA: Diagnosis not present

## 2022-08-27 NOTE — Therapy (Signed)
OUTPATIENT PHYSICAL THERAPY LOWER EXTREMITY TREATMENT   Patient Name: Danielle Rocha MRN: [id number] DOB:1951-02-22, 71 y.o., female Today's Date: 08/27/2022   PT End of Session - 08/27/22 0908     Visit Number 14    Number of Visits 26    Date for PT Re-Evaluation 09/25/22    Authorization Type AETNA MCR    PT Start Time 0902    PT Stop Time 0945    PT Time Calculation (min) 43 min    Activity Tolerance Patient tolerated treatment well    Behavior During Therapy WFL for tasks assessed/performed                     Past Medical History:  Diagnosis Date   Anemia    Arthritis    DJD, low back, thumb   Atrial fibrillation (HCC)    Xarelto anticoagulation. Flecainide antiarrythmic    Chicken pox    Chronic pain syndrome    On disability. History of bilateral hip pain, low back pain. Gabapentin 400 BID, percocet 1 tablet daily per prior provider.    Colon polyp    awaiting records   Depression    zoloft 149m, remeron 324mper psychiatry. ambien 1013mer psychiatry to help wtih sleep element.    Dysrhythmia    Dystonia    described as psychogenic dystonia. Pain and twisting from upper chest and up with triggers "wind, creamy food" on TID ativan per psychiatry previously.    Fibromyalgia    GERD (gastroesophageal reflux disease)    omeprazole OTC   Goiter    states multiple imaging tests, has had biopsies   Hyperlipidemia    lovastatin 39m57mStroke (HCC)    TIA (left side of face and body decreased sensitivity than right face and side)   TIA (transient ischemic attack)    Vaginal atrophy    estrace vaginal cream   Past Surgical History:  Procedure Laterality Date   ATRIAL FIBRILLATION ABLATION     BIOPSY THYROID     fusion l4-l5  09/22/1998   GLUTEUS MINIMUS REPAIR Right 05/19/2022   Procedure: RIGHT HIP GLUTEUS MEDIUS TENDON REPAIR;  Surgeon: BoksVanetta Mulders;  [facility];  Laterality: Right;   LAMINECTOMY     L4-L5    OTHER SURGICAL HISTORY     tennis elbow surgery   piriformis release  09/22/1997   right hip   TONSILLECTOMY AND ADENOIDECTOMY  age 54   62AGINAL HYSTERECTOMY  09/23/1991   Patient Active Problem List   Diagnosis Date Noted   Tear of right gluteus medius tendon    Sore in nose 07/30/2021   Involuntary movements 02/14/2020   Depression with anxiety 02/14/2020   Aortic atherosclerosis (HCC)[place]/18/2021   Delayed gastric emptying 02/07/2020   Dystonia    Restless legs 07/27/2018   Recurrent UTI 10/07/2016   Major depression in full remission (HCC)[place]/08/2016   GERD (gastroesophageal reflux disease) 01/30/2016   History of SI/intentional drug overdose 11/22/2015   Atrial fibrillation (HCC)    Chronic pain syndrome    Hyperlipidemia     REFERRING PROVIDER: BoksVanetta Mulders  REFERRING DIAG: S76.011A (ICD-10-CM) - Tear of right gluteus medius tendon, initial encounter   THERAPY DIAG:  No diagnosis found.  Rationale for Evaluation and Treatment Rehabilitation  ONSET DATE: DOS 05/19/2022  SUBJECTIVE:   SUBJECTIVE  "I did ok after last session.  I didn't have to ice.  My pain was only 1.5(/10)"  PERTINENT HISTORY:  -R hip gluteus medius/minimus repair and trochanteric bursectomy on 05/19/2022.  -L4-5 Fusion in 2000, chronic pain syndrome, Atrial fibrillation, fibromyalgia, Depression, TIA  -R piriformis release in 1999 hip     PAIN:  Are you having pain? Yes NPRS:  1/10 Location:  R hip  Aggravating factors factors: standing walking  Relieving factors: rest   PRECAUTIONS: Other: per surgical protocol.  Wb'ing restrictions.  L4-5 Fusion, A-fib  WEIGHT BEARING RESTRICTIONS Yes    FALLS:  Has patient fallen in last 6 months? Yes. Number of falls 1-2 due to medications  LIVING ENVIRONMENT: Lives with: lives with their spouse and lives with their son Lives in: one story home with an attic Stairs: 5 steps with rail to enter front, 4 steps with rail to enter side, and 4  steps, with rails to enter back  Has following equipment at home: Gilford Rile - 2 wheeled and rollator and quad cane  OCCUPATION: pt is retired  PLOF: Independent ; Pt ambulated without an AD.  Pt had increased pain with household chores.  She was limited with ambulation and shopping.   PATIENT GOALS pt wants to be able to walk without pain, gardening, and be out with her family.     OBJECTIVE:   DIAGNOSTIC FINDINGS: pt has had prior x rays and MRIs    TODAY'S TREATMENT: Pt seen for aquatic therapy today.  Treatment took place in water 3.25-4.5 ft in depth at the [facility]. Temp of water was 91.  Pt entered/exited the pool via stairs with bilat rail independently.  * walking forward/ backward with varied speed, cues for neutral R foot position  * side stepping with varied speed, cues for neutral foot position * SLS with single hand float at side (challenge on RLE!) * with rainbow hand floats for support:  SLS with front/back leg swings and hip abdct/add - with varied speeds * walking backward/ forward with single rainbow hand float at side * holding wall:  1/2 diamond (SLS, opp LE hip/knee flex -> abdct -> flex to neutral stance) then just hip abdct/knee flex in range to tolerance * holding wall in 31f 6": Squats with cues for neutral spine (avoiding neck ext), hip hinge, forward weight shift - x 10  Pt requires the buoyancy and hydrostatic pressure of water for support, and to offload joints by unweighting joint load by at least 50 % in navel deep water and by at least 75-80% in chest to neck deep water.  Viscosity of the water is needed for resistance of strengthening. Water current perturbations provides challenge to standing balance requiring increased core activation.  * after dried off: applied 2 squid shaped pieces, criss-crossed over bruise on Rt greater trochanter to assist with edema reduction.    PATIENT EDUCATION:  Education details: aquatic progressions/  modifications Person educated: Patient Education method: EConsulting civil engineer DMedia planner and Verbal cues Education comprehension: verbalized understanding, returned demonstration, verbal cues required, and needs further education   HOME EXERCISE PROGRAM: Access Code: [email or web address] URL: [email or web address] Date: 05/23/2022 Prepared by: TRonny Flurry - Supine Hip Abduction  - 1 x daily - 7 x weekly - 2 sets - 10 reps - Heel Raises with Counter Support  - 1 x daily - 5-7 x weekly - 2 sets - 10 reps -Stool rotations 1x/day 2 sets of 10 reps     ASSESSMENT:  CLINICAL IMPRESSION: Pt is observed with RLE IR and supination of foot in stance to toe-off phase of gait.  Time  spent with cues for more neutral foot position throughout session.   She reported increase in BLE discomfort to 2.5/10 with increased speed of exercises.  Encouraged self care after session (ice hips) to prevent flare up.   Pt should benefit from cont skilled PT services per protocol to address ongoing goals and to improve overall function.     OBJECTIVE IMPAIRMENTS Abnormal gait, decreased activity tolerance, decreased balance, decreased endurance, decreased mobility, difficulty walking, decreased ROM, decreased strength, hypomobility, impaired flexibility, and pain.   ACTIVITY LIMITATIONS lifting, bending, standing, squatting, stairs, transfers, bed mobility, dressing, and locomotion level  PARTICIPATION LIMITATIONS: cleaning, laundry, driving, shopping, community activity, and gardening  PERSONAL FACTORS Time since onset of injury/illness/exacerbation and 3+ comorbidities: L4-5 Fusion in 2000, chronic pain syndrome, Atrial fibrillation, fibromyalgia, Depression   are also affecting patient's functional outcome.   REHAB POTENTIAL: Good  CLINICAL DECISION MAKING: Stable/uncomplicated  EVALUATION COMPLEXITY: Low   GOALS:  SHORT TERM GOALS: Target date: 06/20/2022  Pt will be independent and compliant with  HEP for improved pain, strength, and function Baseline: Goal status: GOAL MET  2.  Pt will progress with exercises per protocol without adverse effects to improve strength and mobility.   Baseline:  Goal status: Ongoing  Target Date:  07/04/2022   3.  Pt will progress with PROM per protocol without adverse effects for improved stiffness and mobility.  Baseline:  Goal status: MET -07/24/22 per pt report  Target date:  07/18/2022  4.  Pt will progress Wb'ing and appropriately wean AD per MD orders or protocol. Baseline: currently without AD in house, [facility] in community - 07/24/22 Goal status: MET  Target date:  07/04/2022  5.  Pt will be able to perform a 6 inch step up with good form and control with L LE leading for improved performance of stairs abd and improved functional strength. Baseline: has completed this in the water without difficulty  Goal status: Partially Met - 07/24/22 Target date:  08/15/2022   6.  Pt will report improved tolerance with standing and performing household chores.  Baseline:  Goal status: GOAL MET Target date:  08/15/2022  LONG TERM GOALS: Target date: 09/25/2021   Pt will ambulate with a normalized heel to toe gait without limping Baseline:  Goal status: ONGOING  2.  Pt will ambulate extended community distance without an AD without significant pain and difficulty.  Baseline:  Goal status: ONGOING  3.  Pt will be able to perform her normal standing activities and household chores without significant pain.  Baseline:  Goal status: ONGOING  4.    Pt will demo at least 4/5 hip flexion, 4-/5 hip abd, and 5/5 knee extension strength for improved performance of and tolerance with functional mobility.  Baseline:  Goal status: ONGOING  5.  Pt will be able to perform stairs with a reciprocal gait with a rail with good control.  Baseline:  Goal status: Ongoing      PLAN: PT FREQUENCY:  2x/wk  PT DURATION: other: 8 weeks  PLANNED INTERVENTIONS:  Therapeutic exercises, Therapeutic activity, Neuromuscular re-education, Balance training, Gait training, Patient/Family education, Self Care, Joint mobilization, Stair training, DME instructions, Aquatic Therapy, Dry Needling, Cryotherapy, Moist heat, scar mobilization, Taping, Ultrasound, Manual therapy, and Re-evaluation  PLAN FOR NEXT SESSION: Cont per Dr. Eddie Dibbles gluteus medius repair protocol.  Pt to continue with 1 aquatic and 1 land based PT each week.    Kerin Perna, PTA 08/27/22 11:02 AM [place] [phone]  [place], [place], [address] Phone: [phone]   Fax:  [phone]

## 2022-08-31 NOTE — Therapy (Signed)
OUTPATIENT PHYSICAL THERAPY LOWER EXTREMITY TREATMENT   Patient Name: Danielle Rocha MRN: [id number] DOB:Feb 06, 1951, 71 y.o., female Today's Date: 09/02/2022   PT End of Session - 09/01/22 0922     Visit Number 15    Number of Visits 26    Date for PT Re-Evaluation 09/25/22    Authorization Type AET[employer] MCR    PT Start Time 0856    PT Stop Time 0935    PT Time Calculation (min) 39 min    Activity Tolerance Patient limited by pain    Behavior During Therapy WFL for tasks assessed/performed                      Past Medical History:  Diagnosis Date   Anemia    Arthritis    DJD, low back, thumb   Atrial fibrillation (HCC)    Xarelto anticoagulation. Flecainide antiarrythmic    Chicken pox    Chronic pain syndrome    On disability. History of bilateral hip pain, low back pain. Gabapentin 400 BID, percocet 1 tablet daily per prior provider.    Colon polyp    awaiting records   Depression    zoloft 129m, remeron 371mper psychiatry. ambien 1027mer psychiatry to help wtih sleep element.    Dysrhythmia    Dystonia    described as psychogenic dystonia. Pain and twisting from upper chest and up with triggers "wind, creamy food" on TID ativan per psychiatry previously.    Fibromyalgia    GERD (gastroesophageal reflux disease)    omeprazole OTC   Goiter    states multiple imaging tests, has had biopsies   Hyperlipidemia    lovastatin 5m23mStroke (HCC)    TIA (left side of face and body decreased sensitivity than right face and side)   TIA (transient ischemic attack)    Vaginal atrophy    estrace vaginal cream   Past Surgical History:  Procedure Laterality Date   ATRIAL FIBRILLATION ABLATION     BIOPSY THYROID     fusion l4-l5  09/22/1998   GLUTEUS MINIMUS REPAIR Right 05/19/2022   Procedure: RIGHT HIP GLUTEUS MEDIUS TENDON REPAIR;  Surgeon: BoksVanetta Mulders;  [facility];  Laterality: Right;   LAMINECTOMY     L4-L5   OTHER  SURGICAL HISTORY     tennis elbow surgery   piriformis release  09/22/1997   right hip   TONSILLECTOMY AND ADENOIDECTOMY  age 29   29AGINAL HYSTERECTOMY  09/23/1991   Patient Active Problem List   Diagnosis Date Noted   Tear of right gluteus medius tendon    Sore in nose 07/30/2021   Involuntary movements 02/14/2020   Depression with anxiety 02/14/2020   Aortic atherosclerosis (HCC)[place]/18/2021   Delayed gastric emptying 02/07/2020   Dystonia    Restless legs 07/27/2018   Recurrent UTI 10/07/2016   Major depression in full remission (HCC)[place]/08/2016   GERD (gastroesophageal reflux disease) 01/30/2016   History of SI/intentional drug overdose 11/22/2015   Atrial fibrillation (HCC)    Chronic pain syndrome    Hyperlipidemia     REFERRING PROVIDER: BoksVanetta Mulders  REFERRING DIAG: S76.011A (ICD-10-CM) - Tear of right gluteus medius tendon, initial encounter   THERAPY DIAG:  Stiffness of right hip, not elsewhere classified  Muscle weakness (generalized)  Difficulty in walking, not elsewhere classified  Pain in right hip  Rationale for Evaluation and Treatment Rehabilitation  ONSET DATE: DOS 05/19/2022  SUBJECTIVE:  SUBJECTIVE   Pt is 15 weeks s/p R hip gluteus medius/minimus repair and trochanteric bursectomy.  Pt states she had increased pain in bilat hips after prior aquatic Rx.  Pt hasn't done her exercises since aquatic visit.  Pt states MD spoke to her about dry needling last visit.   Pt is not taking any pain meds and is not taking tylenol.  Pt reports compliance with HEP.     PERTINENT HISTORY:  -R hip gluteus medius/minimus repair and trochanteric bursectomy on 05/19/2022.  -L4-5 Fusion in 2000, chronic pain syndrome, Atrial fibrillation, fibromyalgia, Depression, TIA  -R piriformis release in 1999 hip     PAIN:  Are you having pain? Yes NPRS:  3.5/10 Location:  R hip  Aggravating factors factors: standing walking  Relieving factors: rest    PRECAUTIONS: Other: per surgical protocol.  Wb'ing restrictions.  L4-5 Fusion, A-fib  WEIGHT BEARING RESTRICTIONS Yes    FALLS:  Has patient fallen in last 6 months? Yes. Number of falls 1-2 due to medications  LIVING ENVIRONMENT: Lives with: lives with their spouse and lives with their son Lives in: one story home with an attic Stairs: 5 steps with rail to enter front, 4 steps with rail to enter side, and 4 steps, with rails to enter back  Has following equipment at home: Gilford Rile - 2 wheeled and rollator and quad cane  OCCUPATION: pt is retired  PLOF: Independent ; Pt ambulated without an AD.  Pt had increased pain with household chores.  She was limited with ambulation and shopping.   PATIENT GOALS pt wants to be able to walk without pain, gardening, and be out with her family.     OBJECTIVE:   DIAGNOSTIC FINDINGS: pt has had prior x rays and MRIs    TODAY'S TREATMENT:   Therapeutic Exercise: -Reviewed response to prior Rx, pt presentation, pain level, and HEP compliance. -Pt performed:           LAQ 3 x 10 with 2#  Seated HS curl with YTB x10          Supine hip abd x10 reps Standing hip abduction x10 on R , 1x10 on L Stool rotations IR/ER 2x10   Standing heel raises 2x10  Recumbent bike x 1.5 mins at Level 0  Manual Therapy: Pt received gentle STM to R glute and hip in L S/L'ing with pillow between knees    PATIENT EDUCATION:  Education details:  Pt had questions concerning dry needling and PT answered questions.  HEP, exercise form, POC, and exercise rationale.  Person educated: Patient Education method: Explanation, Demonstration, and Verbal cues Education comprehension: verbalized understanding, returned demonstration, verbal cues required, and needs further education   HOME EXERCISE PROGRAM: Access Code: [email or web address] URL: [email or web address] Date: 05/23/2022 Prepared by: Ronny Flurry  - Supine Hip Abduction  - 1 x daily - 7 x weekly - 2  sets - 10 reps - Heel Raises with Counter Support  - 1 x daily - 5-7 x weekly - 2 sets - 10 reps -Stool rotations 1x/day 2 sets of 10 reps     ASSESSMENT:  CLINICAL IMPRESSION: Pt presents to Rx stating her hip has been hurting recently.  Pt didn't tolerate exercises per protocol well today having pain and irritation with mostly all exercises.  PT decreased exercises today due to pain.  PT performed STM to R hip and pt responded well.  Pt states she felt much better after STM.  Her pain level had increased with exercises  and decreased to 2.75 to 3/10 after manual therapy.  Pt should benefit from cont skilled PT services per protocol to address ongoing goals and to improve overall function.    OBJECTIVE IMPAIRMENTS Abnormal gait, decreased activity tolerance, decreased balance, decreased endurance, decreased mobility, difficulty walking, decreased ROM, decreased strength, hypomobility, impaired flexibility, and pain.   ACTIVITY LIMITATIONS lifting, bending, standing, squatting, stairs, transfers, bed mobility, dressing, and locomotion level  PARTICIPATION LIMITATIONS: cleaning, laundry, driving, shopping, community activity, and gardening  PERSONAL FACTORS Time since onset of injury/illness/exacerbation and 3+ comorbidities: L4-5 Fusion in 2000, chronic pain syndrome, Atrial fibrillation, fibromyalgia, Depression   are also affecting patient's functional outcome.   REHAB POTENTIAL: Good  CLINICAL DECISION MAKING: Stable/uncomplicated  EVALUATION COMPLEXITY: Low   GOALS:  SHORT TERM GOALS: Target date: 06/20/2022  Pt will be independent and compliant with HEP for improved pain, strength, and function Baseline: Goal status: GOAL MET  2.  Pt will progress with exercises per protocol without adverse effects to improve strength and mobility.   Baseline:  Goal status: Ongoing  Target Date:  07/04/2022   3.  Pt will progress with PROM per protocol without adverse effects for improved  stiffness and mobility.  Baseline:  Goal status: MET -07/24/22 per pt report  Target date:  07/18/2022  4.  Pt will progress Wb'ing and appropriately wean AD per MD orders or protocol. Baseline: currently without AD in house, [facility] in community - 07/24/22 Goal status:Ongoing Target date:  07/04/2022  5.  Pt will be able to perform a 6 inch step up with good form and control with L LE leading for improved performance of stairs abd and improved functional strength. Baseline: has completed this in the water without difficulty  Goal status: Partially Met - 07/24/22 Target date:  08/15/2022   6.  Pt will report improved tolerance with standing and performing household chores.  Baseline:  Goal status: GOAL MET Target date:  08/15/2022  LONG TERM GOALS: Target date: 09/25/2021   Pt will ambulate with a normalized heel to toe gait without limping Baseline:  Goal status: ONGOING  2.  Pt will ambulate extended community distance without an AD without significant pain and difficulty.  Baseline:  Goal status: ONGOING  3.  Pt will be able to perform her normal standing activities and household chores without significant pain.  Baseline:  Goal status: ONGOING  4.    Pt will demo at least 4/5 hip flexion, 4-/5 hip abd, and 5/5 knee extension strength for improved performance of and tolerance with functional mobility.  Baseline:  Goal status: ONGOING  5.  Pt will be able to perform stairs with a reciprocal gait with a rail with good control.  Baseline:  Goal status: Ongoing      PLAN: PT FREQUENCY:  2x/wk  PT DURATION: other: 8 weeks  PLANNED INTERVENTIONS: Therapeutic exercises, Therapeutic activity, Neuromuscular re-education, Balance training, Gait training, Patient/Family education, Self Care, Joint mobilization, Stair training, DME instructions, Aquatic Therapy, Dry Needling, Cryotherapy, Moist heat, scar mobilization, Taping, Ultrasound, Manual therapy, and Re-evaluation  PLAN  FOR NEXT SESSION: Cont per Dr. Eddie Dibbles gluteus medius repair protocol.  Pt to continue with 1 aquatic and 1 land based PT each week.    Selinda Michaels III PT, DPT 09/02/22 6:27 AM

## 2022-09-01 ENCOUNTER — Ambulatory Visit (HOSPITAL_BASED_OUTPATIENT_CLINIC_OR_DEPARTMENT_OTHER): Payer: Medicare HMO | Admitting: Physical Therapy

## 2022-09-01 ENCOUNTER — Encounter (HOSPITAL_BASED_OUTPATIENT_CLINIC_OR_DEPARTMENT_OTHER): Payer: Self-pay | Admitting: Physical Therapy

## 2022-09-01 DIAGNOSIS — M25651 Stiffness of right hip, not elsewhere classified: Secondary | ICD-10-CM

## 2022-09-01 DIAGNOSIS — M25551 Pain in right hip: Secondary | ICD-10-CM | POA: Diagnosis not present

## 2022-09-01 DIAGNOSIS — M6281 Muscle weakness (generalized): Secondary | ICD-10-CM | POA: Diagnosis not present

## 2022-09-01 DIAGNOSIS — R262 Difficulty in walking, not elsewhere classified: Secondary | ICD-10-CM

## 2022-09-01 DIAGNOSIS — M25552 Pain in left hip: Secondary | ICD-10-CM | POA: Diagnosis not present

## 2022-09-03 ENCOUNTER — Ambulatory Visit (HOSPITAL_BASED_OUTPATIENT_CLINIC_OR_DEPARTMENT_OTHER): Payer: Medicare HMO | Admitting: Physical Therapy

## 2022-09-03 ENCOUNTER — Encounter (HOSPITAL_BASED_OUTPATIENT_CLINIC_OR_DEPARTMENT_OTHER): Payer: Self-pay | Admitting: Physical Therapy

## 2022-09-03 DIAGNOSIS — M25651 Stiffness of right hip, not elsewhere classified: Secondary | ICD-10-CM

## 2022-09-03 DIAGNOSIS — R262 Difficulty in walking, not elsewhere classified: Secondary | ICD-10-CM

## 2022-09-03 DIAGNOSIS — M6281 Muscle weakness (generalized): Secondary | ICD-10-CM

## 2022-09-03 DIAGNOSIS — M25552 Pain in left hip: Secondary | ICD-10-CM

## 2022-09-03 DIAGNOSIS — M25551 Pain in right hip: Secondary | ICD-10-CM | POA: Diagnosis not present

## 2022-09-03 NOTE — Therapy (Signed)
OUTPATIENT PHYSICAL THERAPY LOWER EXTREMITY TREATMENT   Patient Name: Danielle Rocha MRN: [id number] DOB:02-25-1951, 71 y.o., female Today's Date: 09/02/2022   PT End of Session - 09/01/22 0922     Visit Number 15    Number of Visits 26    Date for PT Re-Evaluation 09/25/22    Authorization Type AET[employer] MCR    PT Start Time 0856    PT Stop Time 0935    PT Time Calculation (min) 39 min    Activity Tolerance Patient limited by pain    Behavior During Therapy WFL for tasks assessed/performed                      Past Medical History:  Diagnosis Date   Anemia    Arthritis    DJD, low back, thumb   Atrial fibrillation (HCC)    Xarelto anticoagulation. Flecainide antiarrythmic    Chicken pox    Chronic pain syndrome    On disability. History of bilateral hip pain, low back pain. Gabapentin 400 BID, percocet 1 tablet daily per prior provider.    Colon polyp    awaiting records   Depression    zoloft 176m, remeron 314mper psychiatry. ambien 1065mer psychiatry to help wtih sleep element.    Dysrhythmia    Dystonia    described as psychogenic dystonia. Pain and twisting from upper chest and up with triggers "wind, creamy food" on TID ativan per psychiatry previously.    Fibromyalgia    GERD (gastroesophageal reflux disease)    omeprazole OTC   Goiter    states multiple imaging tests, has had biopsies   Hyperlipidemia    lovastatin 106m36mStroke (HCC)    TIA (left side of face and body decreased sensitivity than right face and side)   TIA (transient ischemic attack)    Vaginal atrophy    estrace vaginal cream   Past Surgical History:  Procedure Laterality Date   ATRIAL FIBRILLATION ABLATION     BIOPSY THYROID     fusion l4-l5  09/22/1998   GLUTEUS MINIMUS REPAIR Right 05/19/2022   Procedure: RIGHT HIP GLUTEUS MEDIUS TENDON REPAIR;  Surgeon: BoksVanetta Mulders;  [facility] [place] [place];  Laterality: Right;   LAMINECTOMY     L4-L5   OTHER  SURGICAL HISTORY     tennis elbow surgery   piriformis release  09/22/1997   right hip   TONSILLECTOMY AND ADENOIDECTOMY  age 29   63AGINAL HYSTERECTOMY  09/23/1991   Patient Active Problem List   Diagnosis Date Noted   Tear of right gluteus medius tendon    Sore in nose 07/30/2021   Involuntary movements 02/14/2020   Depression with anxiety 02/14/2020   Aortic atherosclerosis (HCC)[place]/18/2021   Delayed gastric emptying 02/07/2020   Dystonia    Restless legs 07/27/2018   Recurrent UTI 10/07/2016   Major depression in full remission (HCC)[place]/08/2016   GERD (gastroesophageal reflux disease) 01/30/2016   History of SI/intentional drug overdose 11/22/2015   Atrial fibrillation (HCC)    Chronic pain syndrome    Hyperlipidemia     REFERRING PROVIDER: BoksVanetta Mulders  REFERRING DIAG: S76.011A (ICD-10-CM) - Tear of right gluteus medius tendon, initial encounter   THERAPY DIAG:  Stiffness of right hip, not elsewhere classified  Muscle weakness (generalized)  Difficulty in walking, not elsewhere classified  Pain in right hip  Rationale for Evaluation and Treatment Rehabilitation  ONSET DATE: DOS 05/19/2022  SUBJECTIVE:  SUBJECTIVE  Pt reports flare from last land session has subsided. Appt for DN this afternoon    PERTINENT HISTORY:  -R hip gluteus medius/minimus repair and trochanteric bursectomy on 05/19/2022.  -L4-5 Fusion in 2000, chronic pain syndrome, Atrial fibrillation, fibromyalgia, Depression, TIA  -R piriformis release in 1999 hip     PAIN:  Are you having pain? Yes NPRS:  2/10 Location:  R hip  Aggravating factors factors: standing walking  Relieving factors: rest   PRECAUTIONS: Other: per surgical protocol.  Wb'ing restrictions.  L4-5 Fusion, A-fib  WEIGHT BEARING RESTRICTIONS Yes    FALLS:  Has patient fallen in last 6 months? Yes. Number of falls 1-2 due to medications  LIVING ENVIRONMENT: Lives with: lives with their spouse and lives with  their son Lives in: one story home with an attic Stairs: 5 steps with rail to enter front, 4 steps with rail to enter side, and 4 steps, with rails to enter back  Has following equipment at home: Gilford Rile - 2 wheeled and rollator and quad cane  OCCUPATION: pt is retired  PLOF: Independent ; Pt ambulated without an AD.  Pt had increased pain with household chores.  She was limited with ambulation and shopping.   PATIENT GOALS pt wants to be able to walk without pain, gardening, and be out with her family.     OBJECTIVE:   DIAGNOSTIC FINDINGS: pt has had prior x rays and MRIs    TODAY'S TREATMENT: Pt seen for aquatic therapy today.  Treatment took place in water 3.25-4.5 ft in depth at the [facility]. Temp of water was 91.  Pt entered/exited the pool via stairs with bilat rail independently.   * walking forward/ backward with varied speed, cues for neutral R foot position  * side stepping with varied speed, cues for neutral foot position * SLS R/L continues to have difficulty rle. Sculling with arms to maintain balance *Hip hinges using wall for pivot point initially then without. Cues for glut and hamstring firing Squats: cues for wbing through heels x 10 3.6 ft then bottom step *STS from 3rd water step. VC and demonstration for proper execution and proper use of targeted muscles    Pt requires the buoyancy and hydrostatic pressure of water for support, and to offload joints by unweighting joint load by at least 50 % in navel deep water and by at least 75-80% in chest to neck deep water.  Viscosity of the water is needed for resistance of strengthening. Water current perturbations provides challenge to standing balance requiring increased core activation.      PATIENT EDUCATION:  Education details:  Pt had questions concerning dry needling and PT answered questions.  HEP, exercise form, POC, and exercise rationale.  Person educated: Patient Education method:  Explanation, Demonstration, and Verbal cues Education comprehension: verbalized understanding, returned demonstration, verbal cues required, and needs further education   HOME EXERCISE PROGRAM: Access Code: [email or web address] URL: [email or web address] Date: 05/23/2022 Prepared by: Ronny Flurry  - Supine Hip Abduction  - 1 x daily - 7 x weekly - 2 sets - 10 reps - Heel Raises with Counter Support  - 1 x daily - 5-7 x weekly - 2 sets - 10 reps -Stool rotations 1x/day 2 sets of 10 reps     ASSESSMENT:  CLINICAL IMPRESSION: Focus on right glut and hamstring strength as well as core stabilization. She gains good engagement with all targeted muscles. She tolerates well with increased intensity of tasks. States she felt like  she was walking "normal" this morning to pool.  Reports slight increase in hip discomfort upon completion of sessio to 3/10.  Finishes in [facility] (unbilled) for massage.  Goals ongoing     OBJECTIVE IMPAIRMENTS Abnormal gait, decreased activity tolerance, decreased balance, decreased endurance, decreased mobility, difficulty walking, decreased ROM, decreased strength, hypomobility, impaired flexibility, and pain.   ACTIVITY LIMITATIONS lifting, bending, standing, squatting, stairs, transfers, bed mobility, dressing, and locomotion level  PARTICIPATION LIMITATIONS: cleaning, laundry, driving, shopping, community activity, and gardening  PERSONAL FACTORS Time since onset of injury/illness/exacerbation and 3+ comorbidities: L4-5 Fusion in 2000, chronic pain syndrome, Atrial fibrillation, fibromyalgia, Depression   are also affecting patient's functional outcome.   REHAB POTENTIAL: Good  CLINICAL DECISION MAKING: Stable/uncomplicated  EVALUATION COMPLEXITY: Low   GOALS:  SHORT TERM GOALS: Target date: 06/20/2022  Pt will be independent and compliant with HEP for improved pain, strength, and function Baseline: Goal status: GOAL MET  2.  Pt will progress  with exercises per protocol without adverse effects to improve strength and mobility.   Baseline:  Goal status: Ongoing  Target Date:  07/04/2022   3.  Pt will progress with PROM per protocol without adverse effects for improved stiffness and mobility.  Baseline:  Goal status: MET -07/24/22 per pt report  Target date:  07/18/2022  4.  Pt will progress Wb'ing and appropriately wean AD per MD orders or protocol. Baseline: currently without AD in house, [facility] in community - 07/24/22 Goal status:Ongoing Target date:  07/04/2022  5.  Pt will be able to perform a 6 inch step up with good form and control with L LE leading for improved performance of stairs abd and improved functional strength. Baseline: has completed this in the water without difficulty  Goal status: Partially Met - 07/24/22 Target date:  08/15/2022   6.  Pt will report improved tolerance with standing and performing household chores.  Baseline:  Goal status: GOAL MET Target date:  08/15/2022  LONG TERM GOALS: Target date: 09/25/2021   Pt will ambulate with a normalized heel to toe gait without limping Baseline:  Goal status: ONGOING  2.  Pt will ambulate extended community distance without an AD without significant pain and difficulty.  Baseline:  Goal status: ONGOING  3.  Pt will be able to perform her normal standing activities and household chores without significant pain.  Baseline:  Goal status: ONGOING  4.    Pt will demo at least 4/5 hip flexion, 4-/5 hip abd, and 5/5 knee extension strength for improved performance of and tolerance with functional mobility.  Baseline:  Goal status: ONGOING  5.  Pt will be able to perform stairs with a reciprocal gait with a rail with good control.  Baseline:  Goal status: Ongoing      PLAN: PT FREQUENCY:  2x/wk  PT DURATION: other: 8 weeks  PLANNED INTERVENTIONS: Therapeutic exercises, Therapeutic activity, Neuromuscular re-education, Balance training, Gait  training, Patient/Family education, Self Care, Joint mobilization, Stair training, DME instructions, Aquatic Therapy, Dry Needling, Cryotherapy, Moist heat, scar mobilization, Taping, Ultrasound, Manual therapy, and Re-evaluation  PLAN FOR NEXT SESSION: Cont per Dr. Eddie Dibbles gluteus medius repair protocol.  Pt to continue with 1 aquatic and 1 land based PT each week.    Stanton Kidney Tharon Aquas) Jamarkus [place] MPT 09/03/22 234p

## 2022-09-03 NOTE — Therapy (Incomplete)
OUTPATIENT PHYSICAL THERAPY LOWER EXTREMITY TREATMENT   Patient Name: Danielle Rocha MRN: [id number] DOB:05-May-1951, 71 y.o., female Today's Date: 09/03/2022              Past Medical History:  Diagnosis Date   Anemia    Arthritis    DJD, low back, thumb   Atrial fibrillation (HCC)    Xarelto anticoagulation. Flecainide antiarrythmic    Chicken pox    Chronic pain syndrome    On disability. History of bilateral hip pain, low back pain. Gabapentin 400 BID, percocet 1 tablet daily per prior provider.    Colon polyp    awaiting records   Depression    zoloft 136m, remeron 338mper psychiatry. ambien 1074mer psychiatry to help wtih sleep element.    Dysrhythmia    Dystonia    described as psychogenic dystonia. Pain and twisting from upper chest and up with triggers "wind, creamy food" on TID ativan per psychiatry previously.    Fibromyalgia    GERD (gastroesophageal reflux disease)    omeprazole OTC   Goiter    states multiple imaging tests, has had biopsies   Hyperlipidemia    lovastatin 6m39mStroke (HCC)    TIA (left side of face and body decreased sensitivity than right face and side)   TIA (transient ischemic attack)    Vaginal atrophy    estrace vaginal cream   Past Surgical History:  Procedure Laterality Date   ATRIAL FIBRILLATION ABLATION     BIOPSY THYROID     fusion l4-l5  09/22/1998   GLUTEUS MINIMUS REPAIR Right 05/19/2022   Procedure: RIGHT HIP GLUTEUS MEDIUS TENDON REPAIR;  Surgeon: BoksVanetta Mulders;  [facility];  Laterality: Right;   LAMINECTOMY     L4-L5   OTHER SURGICAL HISTORY     tennis elbow surgery   piriformis release  09/22/1997   right hip   TONSILLECTOMY AND ADENOIDECTOMY  age 71   31AGINAL HYSTERECTOMY  09/23/1991   Patient Active Problem List   Diagnosis Date Noted   Tear of right gluteus medius tendon    Sore in nose 07/30/2021   Involuntary movements 02/14/2020   Depression with anxiety  02/14/2020   Aortic atherosclerosis (HCC)[place]/18/2021   Delayed gastric emptying 02/07/2020   Dystonia    Restless legs 07/27/2018   Recurrent UTI 10/07/2016   Major depression in full remission (HCC)[place]/08/2016   GERD (gastroesophageal reflux disease) 01/30/2016   History of SI/intentional drug overdose 11/22/2015   Atrial fibrillation (HCC)    Chronic pain syndrome    Hyperlipidemia     REFERRING PROVIDER: BoksVanetta Mulders  REFERRING DIAG: S76.011A (ICD-10-CM) - Tear of right gluteus medius tendon, initial encounter   THERAPY DIAG:  No diagnosis found.  Rationale for Evaluation and Treatment Rehabilitation  ONSET DATE: DOS 05/19/2022  SUBJECTIVE:   SUBJECTIVE  Pt reports flare from last land session has subsided. Appt for DN this afternoon  Days since surgery: 107   PERTINENT HISTORY:  -R hip gluteus medius/minimus repair and trochanteric bursectomy on 05/19/2022.  -L4-5 Fusion in 2000, chronic pain syndrome, Atrial fibrillation, fibromyalgia, Depression, TIA  -R piriformis release in 1999 hip     PAIN:  Are you having pain? Yes NPRS:  2/10 Location:  R hip  Aggravating factors factors: standing walking  Relieving factors: rest   PRECAUTIONS: Other: per surgical protocol.  Wb'ing restrictions.  L4-5 Fusion, A-fib  WEIGHT BEARING RESTRICTIONS Yes    FALLS:  Has patient fallen in last 6 months? Yes. Number of falls 1-2 due to medications  LIVING ENVIRONMENT: Lives with: lives with their spouse and lives with their son Lives in: one story home with an attic Stairs: 5 steps with rail to enter front, 4 steps with rail to enter side, and 4 steps, with rails to enter back  Has following equipment at home: Gilford Rile - 2 wheeled and rollator and quad cane  OCCUPATION: pt is retired  PLOF: Independent ; Pt ambulated without an AD.  Pt had increased pain with household chores.  She was limited with ambulation and shopping.   PATIENT GOALS pt wants to be able to walk  without pain, gardening, and be out with her family.     OBJECTIVE:   DIAGNOSTIC FINDINGS: pt has had prior x rays and MRIs    TODAY'S TREATMENT: Pt seen for aquatic therapy today.  Treatment took place in water 3.25-4.5 ft in depth at the [facility]. Temp of water was 91.  Pt entered/exited the pool via stairs with bilat rail independently.   * walking forward/ backward with varied speed, cues for neutral R foot position  * side stepping with varied speed, cues for neutral foot position * SLS R/L continues to have difficulty rle. Sculling with arms to maintain balance *Hip hinges using wall for pivot point initially then without. Cues for glut and hamstring firing Squats: cues for wbing through heels x 10 3.6 ft then bottom step *STS from 3rd water step. VC and demonstration for proper execution and proper use of targeted muscles    Pt requires the buoyancy and hydrostatic pressure of water for support, and to offload joints by unweighting joint load by at least 50 % in navel deep water and by at least 75-80% in chest to neck deep water.  Viscosity of the water is needed for resistance of strengthening. Water current perturbations provides challenge to standing balance requiring increased core activation.      PATIENT EDUCATION:  Education details:  Pt had questions concerning dry needling and PT answered questions.  HEP, exercise form, POC, and exercise rationale.  Person educated: Patient Education method: Explanation, Demonstration, and Verbal cues Education comprehension: verbalized understanding, returned demonstration, verbal cues required, and needs further education   HOME EXERCISE PROGRAM: Access Code: [email or web address] URL: [email or web address] Date: 05/23/2022 Prepared by: Ronny Flurry  - Supine Hip Abduction  - 1 x daily - 7 x weekly - 2 sets - 10 reps - Heel Raises with Counter Support  - 1 x daily - 5-7 x weekly - 2 sets - 10 reps -Stool  rotations 1x/day 2 sets of 10 reps     ASSESSMENT:  CLINICAL IMPRESSION: Focus on right glut and hamstring strength as well as core stabilization. She gains good engagement with all targeted muscles. She tolerates well with increased intensity of tasks. States she felt like she was walking "normal" this morning to pool.  Reports slight increase in hip discomfort upon completion of sessio to 3/10.  Finishes in [place] (unbilled) for massage.  Goals ongoing     OBJECTIVE IMPAIRMENTS Abnormal gait, decreased activity tolerance, decreased balance, decreased endurance, decreased mobility, difficulty walking, decreased ROM, decreased strength, hypomobility, impaired flexibility, and pain.   ACTIVITY LIMITATIONS lifting, bending, standing, squatting, stairs, transfers, bed mobility, dressing, and locomotion level  PARTICIPATION LIMITATIONS: cleaning, laundry, driving, shopping, community activity, and gardening  PERSONAL FACTORS Time since onset of injury/illness/exacerbation and 3+ comorbidities: L4-5 Fusion in 2000, chronic pain syndrome,  Atrial fibrillation, fibromyalgia, Depression   are also affecting patient's functional outcome.   REHAB POTENTIAL: Good  CLINICAL DECISION MAKING: Stable/uncomplicated  EVALUATION COMPLEXITY: Low   GOALS:  SHORT TERM GOALS: Target date: 06/20/2022  Pt will be independent and compliant with HEP for improved pain, strength, and function Baseline: Goal status: GOAL MET  2.  Pt will progress with exercises per protocol without adverse effects to improve strength and mobility.   Baseline:  Goal status: Ongoing  Target Date:  07/04/2022   3.  Pt will progress with PROM per protocol without adverse effects for improved stiffness and mobility.  Baseline:  Goal status: MET -07/24/22 per pt report  Target date:  07/18/2022  4.  Pt will progress Wb'ing and appropriately wean AD per MD orders or protocol. Baseline: currently without AD in house, [facility] in  community - 07/24/22 Goal status:Ongoing Target date:  07/04/2022  5.  Pt will be able to perform a 6 inch step up with good form and control with L LE leading for improved performance of stairs abd and improved functional strength. Baseline: has completed this in the water without difficulty  Goal status: Partially Met - 07/24/22 Target date:  08/15/2022   6.  Pt will report improved tolerance with standing and performing household chores.  Baseline:  Goal status: GOAL MET Target date:  08/15/2022  LONG TERM GOALS: Target date: 09/25/2021   Pt will ambulate with a normalized heel to toe gait without limping Baseline:  Goal status: ONGOING  2.  Pt will ambulate extended community distance without an AD without significant pain and difficulty.  Baseline:  Goal status: ONGOING  3.  Pt will be able to perform her normal standing activities and household chores without significant pain.  Baseline:  Goal status: ONGOING  4.    Pt will demo at least 4/5 hip flexion, 4-/5 hip abd, and 5/5 knee extension strength for improved performance of and tolerance with functional mobility.  Baseline:  Goal status: ONGOING  5.  Pt will be able to perform stairs with a reciprocal gait with a rail with good control.  Baseline:  Goal status: Ongoing      PLAN: PT FREQUENCY:  2x/wk  PT DURATION: other: 8 weeks  PLANNED INTERVENTIONS: Therapeutic exercises, Therapeutic activity, Neuromuscular re-education, Balance training, Gait training, Patient/Family education, Self Care, Joint mobilization, Stair training, DME instructions, Aquatic Therapy, Dry Needling, Cryotherapy, Moist heat, scar mobilization, Taping, Ultrasound, Manual therapy, and Re-evaluation  PLAN FOR NEXT SESSION: Cont per Dr. Eddie Dibbles gluteus medius repair protocol.  Pt to continue with 1 aquatic and 1 land based PT each week.    Stanton Kidney Tharon Aquas) [place] MPT 09/03/22 234p

## 2022-09-04 ENCOUNTER — Encounter (HOSPITAL_BASED_OUTPATIENT_CLINIC_OR_DEPARTMENT_OTHER): Payer: Self-pay | Admitting: Physical Therapy

## 2022-09-04 NOTE — Therapy (Addendum)
OUTPATIENT PHYSICAL THERAPY LOWER EXTREMITY TREATMENT   Patient Name: Danielle Rocha MRN: [id number] DOB:12/03/1950, 71 y.o., female Today's Date: 09/04/2022   PT End of Session - 09/04/22 0825     Visit Number 17    Number of Visits 26    Date for PT Re-Evaluation 09/25/22    Authorization Type AET[employer] MCR    PT Start Time 0400    PT Stop Time 0432   Patient had already done exercise session in the pool ealier in the day   PT Time Calculation (min) 32 min    Activity Tolerance Patient limited by pain    Behavior During Therapy WFL for tasks assessed/performed                      Past Medical History:  Diagnosis Date   Anemia    Arthritis    DJD, low back, thumb   Atrial fibrillation (HCC)    Xarelto anticoagulation. Flecainide antiarrythmic    Chicken pox    Chronic pain syndrome    On disability. History of bilateral hip pain, low back pain. Gabapentin 400 BID, percocet 1 tablet daily per prior provider.    Colon polyp    awaiting records   Depression    zoloft 159m, remeron 362mper psychiatry. ambien 1014mer psychiatry to help wtih sleep element.    Dysrhythmia    Dystonia    described as psychogenic dystonia. Pain and twisting from upper chest and up with triggers "wind, creamy food" on TID ativan per psychiatry previously.    Fibromyalgia    GERD (gastroesophageal reflux disease)    omeprazole OTC   Goiter    states multiple imaging tests, has had biopsies   Hyperlipidemia    lovastatin 27m2mStroke (HCC)    TIA (left side of face and body decreased sensitivity than right face and side)   TIA (transient ischemic attack)    Vaginal atrophy    estrace vaginal cream   Past Surgical History:  Procedure Laterality Date   ATRIAL FIBRILLATION ABLATION     BIOPSY THYROID     fusion l4-l5  09/22/1998   GLUTEUS MINIMUS REPAIR Right 05/19/2022   Procedure: RIGHT HIP GLUTEUS MEDIUS TENDON REPAIR;  Surgeon: BoksVanetta Mulders;  [facility];  Laterality: Right;   LAMINECTOMY     L4-L5   OTHER SURGICAL HISTORY     tennis elbow surgery   piriformis release  09/22/1997   right hip   TONSILLECTOMY AND ADENOIDECTOMY  age 69   27AGINAL HYSTERECTOMY  09/23/1991   Patient Active Problem List   Diagnosis Date Noted   Tear of right gluteus medius tendon    Sore in nose 07/30/2021   Involuntary movements 02/14/2020   Depression with anxiety 02/14/2020   Aortic atherosclerosis (HCC)[place]/18/2021   Delayed gastric emptying 02/07/2020   Dystonia    Restless legs 07/27/2018   Recurrent UTI 10/07/2016   Major depression in full remission (HCC)[place]/08/2016   GERD (gastroesophageal reflux disease) 01/30/2016   History of SI/intentional drug overdose 11/22/2015   Atrial fibrillation (HCC)    Chronic pain syndrome    Hyperlipidemia     REFERRING PROVIDER: BoksVanetta Mulders  REFERRING DIAG: S76.011A (ICD-10-CM) - Tear of right gluteus medius tendon, initial encounter   THERAPY DIAG:  Stiffness of right hip, not elsewhere classified  Muscle weakness (generalized)  Difficulty in walking, not elsewhere classified  Rationale for Evaluation and Treatment  Rehabilitation  ONSET DATE: DOS 05/19/2022  SUBJECTIVE:   SUBJECTIVE  The patient tolerated her pool session well. She comes in today for dry needling.    PERTINENT HISTORY:  -R hip gluteus medius/minimus repair and trochanteric bursectomy on 05/19/2022.  -L4-5 Fusion in 2000, chronic pain syndrome, Atrial fibrillation, fibromyalgia, Depression, TIA  -R piriformis release in 1999 hip     PAIN:  Are you having pain? Yes NPRS:  2/10 Location:  R hip  Aggravating factors factors: standing walking  Relieving factors: rest   PRECAUTIONS: Other: per surgical protocol.  Wb'ing restrictions.  L4-5 Fusion, A-fib  WEIGHT BEARING RESTRICTIONS Yes    FALLS:  Has patient fallen in last 6 months? Yes. Number of falls 1-2 due to medications  LIVING  ENVIRONMENT: Lives with: lives with their spouse and lives with their son Lives in: one story home with an attic Stairs: 5 steps with rail to enter front, 4 steps with rail to enter side, and 4 steps, with rails to enter back  Has following equipment at home: Gilford Rile - 2 wheeled and rollator and quad cane  OCCUPATION: pt is retired  PLOF: Independent ; Pt ambulated without an AD.  Pt had increased pain with household chores.  She was limited with ambulation and shopping.   PATIENT GOALS pt wants to be able to walk without pain, gardening, and be out with her family.     OBJECTIVE:   DIAGNOSTIC FINDINGS: pt has had prior x rays and MRIs    TODAY'S TREATMENT: 12/14   Last visit Pt seen for aquatic therapy today.  Treatment took place in water 3.25-4.5 ft in depth at the [facility]. Temp of water was 91.  Pt entered/exited the pool via stairs with bilat rail independently.   * walking forward/ backward with varied speed, cues for neutral R foot position  * side stepping with varied speed, cues for neutral foot position * SLS R/L continues to have difficulty rle. Sculling with arms to maintain balance *Hip hinges using wall for pivot point initially then without. Cues for glut and hamstring firing Squats: cues for wbing through heels x 10 3.6 ft then bottom step *STS from 3rd water step. VC and demonstration for proper execution and proper use of targeted muscles    Pt requires the buoyancy and hydrostatic pressure of water for support, and to offload joints by unweighting joint load by at least 50 % in navel deep water and by at least 75-80% in chest to neck deep water.  Viscosity of the water is needed for resistance of strengthening. Water current perturbations provides challenge to standing balance requiring increased core activation.      PATIENT EDUCATION:  Education details:  Pt had questions concerning dry needling and PT answered questions.  HEP, exercise  form, POC, and exercise rationale.  Person educated: Patient Education method: Explanation, Demonstration, and Verbal cues Education comprehension: verbalized understanding, returned demonstration, verbal cues required, and needs further education   HOME EXERCISE PROGRAM: Access Code: [email or web address] URL: [email or web address] Date: 05/23/2022 Prepared by: Ronny Flurry  - Supine Hip Abduction  - 1 x daily - 7 x weekly - 2 sets - 10 reps - Heel Raises with Counter Support  - 1 x daily - 5-7 x weekly - 2 sets - 10 reps -Stool rotations 1x/day 2 sets of 10 reps     ASSESSMENT:  CLINICAL IMPRESSION: Therapy performed trigger point dry needling today per MD recommendation> She only had a mild twitch in  her gluteal but a really good twtich in her IT band. We reviewed the exercises that she can use if she has post needle soreness. She does not have another appointment scheduled with a needler but she was advised we can work on that     [place] Abnormal gait, decreased activity tolerance, decreased balance, decreased endurance, decreased mobility, difficulty walking, decreased ROM, decreased strength, hypomobility, impaired flexibility, and pain.   ACTIVITY LIMITATIONS lifting, bending, standing, squatting, stairs, transfers, bed mobility, dressing, and locomotion level  PARTICIPATION LIMITATIONS: cleaning, laundry, driving, shopping, community activity, and gardening  PERSONAL FACTORS Time since onset of injury/illness/exacerbation and 3+ comorbidities: L4-5 Fusion in 2000, chronic pain syndrome, Atrial fibrillation, fibromyalgia, Depression   are also affecting patient's functional outcome.   REHAB POTENTIAL: Good  CLINICAL DECISION MAKING: Stable/uncomplicated  EVALUATION COMPLEXITY: Low   GOALS:  SHORT TERM GOALS: Target date: 06/20/2022  Pt will be independent and compliant with HEP for improved pain, strength, and function Baseline: Goal status: GOAL  MET  2.  Pt will progress with exercises per protocol without adverse effects to improve strength and mobility.   Baseline:  Goal status: Ongoing  Target Date:  07/04/2022   3.  Pt will progress with PROM per protocol without adverse effects for improved stiffness and mobility.  Baseline:  Goal status: MET -07/24/22 per pt report  Target date:  07/18/2022  4.  Pt will progress Wb'ing and appropriately wean AD per MD orders or protocol. Baseline: currently without AD in house, [facility] in community - 07/24/22 Goal status:Ongoing Target date:  07/04/2022  5.  Pt will be able to perform a 6 inch step up with good form and control with L LE leading for improved performance of stairs abd and improved functional strength. Baseline: has completed this in the water without difficulty  Goal status: Partially Met - 07/24/22 Target date:  08/15/2022   6.  Pt will report improved tolerance with standing and performing household chores.  Baseline:  Goal status: GOAL MET Target date:  08/15/2022  LONG TERM GOALS: Target date: 09/25/2021   Pt will ambulate with a normalized heel to toe gait without limping Baseline:  Goal status: ONGOING  2.  Pt will ambulate extended community distance without an AD without significant pain and difficulty.  Baseline:  Goal status: ONGOING  3.  Pt will be able to perform her normal standing activities and household chores without significant pain.  Baseline:  Goal status: ONGOING  4.    Pt will demo at least 4/5 hip flexion, 4-/5 hip abd, and 5/5 knee extension strength for improved performance of and tolerance with functional mobility.  Baseline:  Goal status: ONGOING  5.  Pt will be able to perform stairs with a reciprocal gait with a rail with good control.  Baseline:  Goal status: Ongoing      PLAN: PT FREQUENCY:  2x/wk  PT DURATION: other: 8 weeks  PLANNED INTERVENTIONS: Therapeutic exercises, Therapeutic activity, Neuromuscular re-education,  Balance training, Gait training, Patient/Family education, Self Care, Joint mobilization, Stair training, DME instructions, Aquatic Therapy, Dry Needling, Cryotherapy, Moist heat, scar mobilization, Taping, Ultrasound, Manual therapy, and Re-evaluation  PLAN FOR NEXT SESSION: Cont per Dr. Eddie Dibbles gluteus medius repair protocol.  Pt to continue with 1 aquatic and 1 land based PT each week.    Carolyne Littles PT DPT  09/03/22 234p  PHYSICAL THERAPY DISCHARGE SUMMARY  Visits from Start of Care: 17  Current functional level related to goals / functional outcomes:  See above   Remaining deficits: See above   Education / Equipment: Anatomy of condition, POC, HEP, exercise form/rationale    Patient agrees to discharge. Patient goals were partially met. Patient is being discharged due to not returning since the last visit. Jessica C. Hightower PT, DPT 11/13/22 11:16 AM

## 2022-09-05 ENCOUNTER — Other Ambulatory Visit: Payer: Self-pay | Admitting: Physician Assistant

## 2022-09-07 NOTE — Therapy (Incomplete)
OUTPATIENT PHYSICAL THERAPY LOWER EXTREMITY TREATMENT   Patient Name: Danielle Rocha MRN: [id number] DOB:1951/03/03, 71 y.o., female Today's Date: 09/07/2022              Past Medical History:  Diagnosis Date   Anemia    Arthritis    DJD, low back, thumb   Atrial fibrillation (HCC)    Xarelto anticoagulation. Flecainide antiarrythmic    Chicken pox    Chronic pain syndrome    On disability. History of bilateral hip pain, low back pain. Gabapentin 400 BID, percocet 1 tablet daily per prior provider.    Colon polyp    awaiting records   Depression    zoloft 174m, remeron 345mper psychiatry. ambien 1062mer psychiatry to help wtih sleep element.    Dysrhythmia    Dystonia    described as psychogenic dystonia. Pain and twisting from upper chest and up with triggers "wind, creamy food" on TID ativan per psychiatry previously.    Fibromyalgia    GERD (gastroesophageal reflux disease)    omeprazole OTC   Goiter    states multiple imaging tests, has had biopsies   Hyperlipidemia    lovastatin 66m33mStroke (HCC)    TIA (left side of face and body decreased sensitivity than right face and side)   TIA (transient ischemic attack)    Vaginal atrophy    estrace vaginal cream   Past Surgical History:  Procedure Laterality Date   ATRIAL FIBRILLATION ABLATION     BIOPSY THYROID     fusion l4-l5  09/22/1998   GLUTEUS MINIMUS REPAIR Right 05/19/2022   Procedure: RIGHT HIP GLUTEUS MEDIUS TENDON REPAIR;  Surgeon: BoksVanetta Mulders;  [facility] [place];  Laterality: Right;   LAMINECTOMY     L4-L5   OTHER SURGICAL HISTORY     tennis elbow surgery   piriformis release  09/22/1997   right hip   TONSILLECTOMY AND ADENOIDECTOMY  age 41   41AGINAL HYSTERECTOMY  09/23/1991   Patient Active Problem List   Diagnosis Date Noted   Tear of right gluteus medius tendon    Sore in nose 07/30/2021   Involuntary movements 02/14/2020   Depression with anxiety  02/14/2020   Aortic atherosclerosis (HCC)[place]/18/2021   Delayed gastric emptying 02/07/2020   Dystonia    Restless legs 07/27/2018   Recurrent UTI 10/07/2016   Major depression in full remission (HCC)[place]/08/2016   GERD (gastroesophageal reflux disease) 01/30/2016   History of SI/intentional drug overdose 11/22/2015   Atrial fibrillation (HCC)    Chronic pain syndrome    Hyperlipidemia     REFERRING PROVIDER: BoksVanetta Mulders  REFERRING DIAG: S76.011A (ICD-10-CM) - Tear of right gluteus medius tendon, initial encounter   THERAPY DIAG:  No diagnosis found.  Rationale for Evaluation and Treatment Rehabilitation  ONSET DATE: DOS 05/19/2022  SUBJECTIVE:   SUBJECTIVE   [name].  Pt states she had increased pain in bilat hips after prior aquatic Rx.  Pt hasn't done her exercises since aquatic visit.  Pt states MD spoke to her about dry needling last visit.   Pt is not taking any pain meds and is not taking tylenol.  Pt reports compliance with HEP.     PERTINENT HISTORY:  -R hip gluteus medius/minimus repair and trochanteric bursectomy on 05/19/2022.  -L4-5 Fusion in 2000, chronic pain syndrome, Atrial fibrillation, fibromyalgia, Depression, TIA  -R piriformis release in 1999  hip     PAIN:  Are you having pain? Yes NPRS:  3.5/10 Location:  R hip  Aggravating factors factors: standing walking  Relieving factors: rest   PRECAUTIONS: Other: per surgical protocol.  Wb'ing restrictions.  L4-5 Fusion, A-fib  WEIGHT BEARING RESTRICTIONS Yes    FALLS:  Has patient fallen in last 6 months? Yes. Number of falls 1-2 due to medications  LIVING ENVIRONMENT: Lives with: lives with their spouse and lives with their son Lives in: one story home with an attic Stairs: 5 steps with rail to enter front, 4 steps with rail to enter side, and 4 steps, with rails to enter back  Has following equipment at home: Gilford Rile -  2 wheeled and rollator and quad cane  OCCUPATION: pt is retired  PLOF: Independent ; Pt ambulated without an AD.  Pt had increased pain with household chores.  She was limited with ambulation and shopping.   PATIENT GOALS pt wants to be able to walk without pain, gardening, and be out with her family.     OBJECTIVE:   DIAGNOSTIC FINDINGS: pt has had prior x rays and MRIs    TODAY'S TREATMENT:   Therapeutic Exercise: -Reviewed response to prior Rx, pt presentation, pain level, and HEP compliance. -Pt performed:           LAQ 3 x 10 with 2#  Seated HS curl with YTB x10          Supine hip abd x10 reps Standing hip abduction x10 on R , 1x10 on L Stool rotations IR/ER 2x10   Standing heel raises 2x10  Recumbent bike x 1.5 mins at Level 0  Manual Therapy: Pt received gentle STM to R glute and hip in L S/L'ing with pillow between knees    PATIENT EDUCATION:  Education details:  Pt had questions concerning dry needling and PT answered questions.  HEP, exercise form, POC, and exercise rationale.  Person educated: Patient Education method: Explanation, Demonstration, and Verbal cues Education comprehension: verbalized understanding, returned demonstration, verbal cues required, and needs further education   HOME EXERCISE PROGRAM: Access Code: [email or web address] URL: [email or web address] Date: 05/23/2022 Prepared by: Ronny Flurry  - Supine Hip Abduction  - 1 x daily - 7 x weekly - 2 sets - 10 reps - Heel Raises with Counter Support  - 1 x daily - 5-7 x weekly - 2 sets - 10 reps -Stool rotations 1x/day 2 sets of 10 reps     ASSESSMENT:  CLINICAL IMPRESSION: Pt presents to Rx stating her hip has been hurting recently.  Pt didn't tolerate exercises per protocol well today having pain and irritation with mostly all exercises.  PT decreased exercises today due to pain.  PT performed STM to R hip and pt responded well.  Pt states she felt much better after STM.  Her pain  level had increased with exercises and decreased to 2.75 to 3/10 after manual therapy.  Pt should benefit from cont skilled PT services per protocol to address ongoing goals and to improve overall function.    OBJECTIVE IMPAIRMENTS Abnormal gait, decreased activity tolerance, decreased balance, decreased endurance, decreased mobility, difficulty walking, decreased ROM, decreased strength, hypomobility, impaired flexibility, and pain.   ACTIVITY LIMITATIONS lifting, bending, standing, squatting, stairs, transfers, bed mobility, dressing, and locomotion level  PARTICIPATION LIMITATIONS: cleaning, laundry, driving, shopping, community activity, and gardening  PERSONAL FACTORS Time since onset of injury/illness/exacerbation and 3+ comorbidities: L4-5 Fusion in 2000, chronic pain syndrome, Atrial fibrillation, fibromyalgia, Depression  are also affecting patient's functional outcome.   REHAB POTENTIAL: Good  CLINICAL DECISION MAKING: Stable/uncomplicated  EVALUATION COMPLEXITY: Low   GOALS:  SHORT TERM GOALS: Target date: 06/20/2022  Pt will be independent and compliant with HEP for improved pain, strength, and function Baseline: Goal status: GOAL MET  2.  Pt will progress with exercises per protocol without adverse effects to improve strength and mobility.   Baseline:  Goal status: Ongoing  Target Date:  07/04/2022   3.  Pt will progress with PROM per protocol without adverse effects for improved stiffness and mobility.  Baseline:  Goal status: MET -07/24/22 per pt report  Target date:  07/18/2022  4.  Pt will progress Wb'ing and appropriately wean AD per MD orders or protocol. Baseline: currently without AD in house, [facility] in community - 07/24/22 Goal status:Ongoing Target date:  07/04/2022  5.  Pt will be able to perform a 6 inch step up with good form and control with L LE leading for improved performance of stairs abd and improved functional strength. Baseline: has completed  this in the water without difficulty  Goal status: Partially Met - 07/24/22 Target date:  08/15/2022   6.  Pt will report improved tolerance with standing and performing household chores.  Baseline:  Goal status: GOAL MET Target date:  08/15/2022  LONG TERM GOALS: Target date: 09/25/2021   Pt will ambulate with a normalized heel to toe gait without limping Baseline:  Goal status: ONGOING  2.  Pt will ambulate extended community distance without an AD without significant pain and difficulty.  Baseline:  Goal status: ONGOING  3.  Pt will be able to perform her normal standing activities and household chores without significant pain.  Baseline:  Goal status: ONGOING  4.    Pt will demo at least 4/5 hip flexion, 4-/5 hip abd, and 5/5 knee extension strength for improved performance of and tolerance with functional mobility.  Baseline:  Goal status: ONGOING  5.  Pt will be able to perform stairs with a reciprocal gait with a rail with good control.  Baseline:  Goal status: Ongoing      PLAN: PT FREQUENCY:  2x/wk  PT DURATION: other: 8 weeks  PLANNED INTERVENTIONS: Therapeutic exercises, Therapeutic activity, Neuromuscular re-education, Balance training, Gait training, Patient/Family education, Self Care, Joint mobilization, Stair training, DME instructions, Aquatic Therapy, Dry Needling, Cryotherapy, Moist heat, scar mobilization, Taping, Ultrasound, Manual therapy, and Re-evaluation  PLAN FOR NEXT SESSION: Cont per Dr. Eddie Dibbles gluteus medius repair protocol.  Pt to continue with 1 aquatic and 1 land based PT each week.    Selinda Michaels III PT, DPT 09/07/22 8:07 AM

## 2022-09-08 ENCOUNTER — Ambulatory Visit (HOSPITAL_BASED_OUTPATIENT_CLINIC_OR_DEPARTMENT_OTHER): Payer: Medicare HMO | Admitting: Physical Therapy

## 2022-09-10 ENCOUNTER — Ambulatory Visit (HOSPITAL_BASED_OUTPATIENT_CLINIC_OR_DEPARTMENT_OTHER): Payer: Medicare HMO | Admitting: Physical Therapy

## 2022-09-14 NOTE — Therapy (Incomplete)
OUTPATIENT PHYSICAL THERAPY LOWER EXTREMITY TREATMENT   Patient Name: Danielle Rocha MRN: [id number] DOB:[birth year], 71 y.o., female Today's Date: 09/14/2022              Past Medical History:  Diagnosis Date   Anemia    Arthritis    DJD, low back, thumb   Atrial fibrillation (HCC)    Xarelto anticoagulation. Flecainide antiarrythmic    Chicken pox    Chronic pain syndrome    On disability. History of bilateral hip pain, low back pain. Gabapentin 400 BID, percocet 1 tablet daily per prior provider.    Colon polyp    awaiting records   Depression    zoloft 188m, remeron 358mper psychiatry. ambien 1066mer psychiatry to help wtih sleep element.    Dysrhythmia    Dystonia    described as psychogenic dystonia. Pain and twisting from upper chest and up with triggers "wind, creamy food" on TID ativan per psychiatry previously.    Fibromyalgia    GERD (gastroesophageal reflux disease)    omeprazole OTC   Goiter    states multiple imaging tests, has had biopsies   Hyperlipidemia    lovastatin 44m19mStroke (HCC)    TIA (left side of face and body decreased sensitivity than right face and side)   TIA (transient ischemic attack)    Vaginal atrophy    estrace vaginal cream   Past Surgical History:  Procedure Laterality Date   ATRIAL FIBRILLATION ABLATION     BIOPSY THYROID     fusion l4-l5  09/22/1998   GLUTEUS MINIMUS REPAIR Right 05/19/2022   Procedure: RIGHT HIP GLUTEUS MEDIUS TENDON REPAIR;  Surgeon: BoksVanetta Mulders;  [facility] [place];  Laterality: Right;   LAMINECTOMY     L4-L5   OTHER SURGICAL HISTORY     tennis elbow surgery   piriformis release  09/22/1997   right hip   TONSILLECTOMY AND ADENOIDECTOMY  age 37   92AGINAL HYSTERECTOMY  09/23/1991   Patient Active Problem List   Diagnosis Date Noted   Tear of right gluteus medius tendon    Sore in nose 07/30/2021   Involuntary movements 02/14/2020   Depression with anxiety  02/14/2020   Aortic atherosclerosis (HCC)[place]/18/2021   Delayed gastric emptying 02/07/2020   Dystonia    Restless legs 07/27/2018   Recurrent UTI 10/07/2016   Major depression in full remission (HCC)[place]/08/2016   GERD (gastroesophageal reflux disease) 01/30/2016   History of SI/intentional drug overdose 11/22/2015   Atrial fibrillation (HCC)    Chronic pain syndrome    Hyperlipidemia     REFERRING PROVIDER: BoksVanetta Mulders  REFERRING DIAG: S76.011A (ICD-10-CM) - Tear of right gluteus medius tendon, initial encounter   THERAPY DIAG:  No diagnosis found.  Rationale for Evaluation and Treatment Rehabilitation  ONSET DATE: DOS 05/19/2022  SUBJECTIVE:   SUBJECTIVE   Pt is 17 weeks and 1 day s/p R hip gluteus medius/minimus repair and trochanteric bursectomy.    Response to dry needling???  Pt states she had increased pain in bilat hips after prior aquatic Rx.  Pt hasn't done her exercises since aquatic visit.  Pt states MD spoke to her about dry needling last visit.   Pt is not taking any pain meds and is not taking tylenol.  Pt reports compliance with HEP.     PERTINENT HISTORY:  -R hip gluteus medius/minimus repair and trochanteric bursectomy on 05/19/2022.  -L4-5 Fusion in 2000, chronic pain syndrome, Atrial  fibrillation, fibromyalgia, Depression, TIA  -R piriformis release in 1999 hip     PAIN:  Are you having pain? Yes NPRS:  3.5/10 Location:  R hip  Aggravating factors factors: standing walking  Relieving factors: rest   PRECAUTIONS: Other: per surgical protocol.  Wb'ing restrictions.  L4-5 Fusion, A-fib  WEIGHT BEARING RESTRICTIONS Yes    FALLS:  Has patient fallen in last 6 months? Yes. Number of falls 1-2 due to medications  LIVING ENVIRONMENT: Lives with: lives with their spouse and lives with their son Lives in: one story home with an attic Stairs: 5 steps with rail to enter front, 4 steps with rail to enter side, and 4 steps, with rails to enter back   Has following equipment at home: Gilford Rile - 2 wheeled and rollator and quad cane  OCCUPATION: pt is retired  PLOF: Independent ; Pt ambulated without an AD.  Pt had increased pain with household chores.  She was limited with ambulation and shopping.   PATIENT GOALS pt wants to be able to walk without pain, gardening, and be out with her family.     OBJECTIVE:   DIAGNOSTIC FINDINGS: pt has had prior x rays and MRIs    TODAY'S TREATMENT:   Therapeutic Exercise: -Reviewed response to prior Rx, pt presentation, pain level, and HEP compliance. -Pt performed:           LAQ 3 x 10 with 2#  Seated HS curl with YTB x10          Supine hip abd x10 reps Standing hip abduction x10 on R , 1x10 on L Stool rotations IR/ER 2x10   Standing heel raises 2x10  Recumbent bike x 1.5 mins at Level 0  Therapeutic Activity: Low level marching on floor x 10 and x 10 on airex with UE support            Standing hip hiking with bilat UE support 2x10 on R, 1x10 on L            Step ups 2x10 on 4 inch step   Manual Therapy: Pt received gentle STM to R glute and hip in L S/L'ing with pillow between knees    PATIENT EDUCATION:  Education details:  Pt had questions concerning dry needling and PT answered questions.  HEP, exercise form, POC, and exercise rationale.  Person educated: Patient Education method: Explanation, Demonstration, and Verbal cues Education comprehension: verbalized understanding, returned demonstration, verbal cues required, and needs further education   HOME EXERCISE PROGRAM: Access Code: [email or web address] URL: [email or web address] Date: 05/23/2022 Prepared by: Ronny Flurry  - Supine Hip Abduction  - 1 x daily - 7 x weekly - 2 sets - 10 reps - Heel Raises with Counter Support  - 1 x daily - 5-7 x weekly - 2 sets - 10 reps -Stool rotations 1x/day 2 sets of 10 reps     ASSESSMENT:  CLINICAL IMPRESSION: Pt presents to Rx stating her hip has been hurting  recently.  Pt didn't tolerate exercises per protocol well today having pain and irritation with mostly all exercises.  PT decreased exercises today due to pain.  PT performed STM to R hip and pt responded well.  Pt states she felt much better after STM.  Her pain level had increased with exercises and decreased to 2.75 to 3/10 after manual therapy.  Pt should benefit from cont skilled PT services per protocol to address ongoing goals and to improve overall function.    OBJECTIVE IMPAIRMENTS Abnormal gait,  decreased activity tolerance, decreased balance, decreased endurance, decreased mobility, difficulty walking, decreased ROM, decreased strength, hypomobility, impaired flexibility, and pain.   ACTIVITY LIMITATIONS lifting, bending, standing, squatting, stairs, transfers, bed mobility, dressing, and locomotion level  PARTICIPATION LIMITATIONS: cleaning, laundry, driving, shopping, community activity, and gardening  PERSONAL FACTORS Time since onset of injury/illness/exacerbation and 3+ comorbidities: L4-5 Fusion in 2000, chronic pain syndrome, Atrial fibrillation, fibromyalgia, Depression   are also affecting patient's functional outcome.   REHAB POTENTIAL: Good  CLINICAL DECISION MAKING: Stable/uncomplicated  EVALUATION COMPLEXITY: Low   GOALS:  SHORT TERM GOALS: Target date: 06/20/2022  Pt will be independent and compliant with HEP for improved pain, strength, and function Baseline: Goal status: GOAL MET  2.  Pt will progress with exercises per protocol without adverse effects to improve strength and mobility.   Baseline:  Goal status: Ongoing  Target Date:  07/04/2022   3.  Pt will progress with PROM per protocol without adverse effects for improved stiffness and mobility.  Baseline:  Goal status: MET -07/24/22 per pt report  Target date:  07/18/2022  4.  Pt will progress Wb'ing and appropriately wean AD per MD orders or protocol. Baseline: currently without AD in house, [facility] in  community - 07/24/22 Goal status:Ongoing Target date:  07/04/2022  5.  Pt will be able to perform a 6 inch step up with good form and control with L LE leading for improved performance of stairs abd and improved functional strength. Baseline: has completed this in the water without difficulty  Goal status: Partially Met - 07/24/22 Target date:  08/15/2022   6.  Pt will report improved tolerance with standing and performing household chores.  Baseline:  Goal status: GOAL MET Target date:  08/15/2022  LONG TERM GOALS: Target date: 09/25/2021   Pt will ambulate with a normalized heel to toe gait without limping Baseline:  Goal status: ONGOING  2.  Pt will ambulate extended community distance without an AD without significant pain and difficulty.  Baseline:  Goal status: ONGOING  3.  Pt will be able to perform her normal standing activities and household chores without significant pain.  Baseline:  Goal status: ONGOING  4.    Pt will demo at least 4/5 hip flexion, 4-/5 hip abd, and 5/5 knee extension strength for improved performance of and tolerance with functional mobility.  Baseline:  Goal status: ONGOING  5.  Pt will be able to perform stairs with a reciprocal gait with a rail with good control.  Baseline:  Goal status: Ongoing      PLAN: PT FREQUENCY:  2x/wk  PT DURATION: other: 8 weeks  PLANNED INTERVENTIONS: Therapeutic exercises, Therapeutic activity, Neuromuscular re-education, Balance training, Gait training, Patient/Family education, Self Care, Joint mobilization, Stair training, DME instructions, Aquatic Therapy, Dry Needling, Cryotherapy, Moist heat, scar mobilization, Taping, Ultrasound, Manual therapy, and Re-evaluation  PLAN FOR NEXT SESSION: Cont per Dr. Eddie Dibbles gluteus medius repair protocol.  Pt to continue with 1 aquatic and 1 land based PT each week.    Selinda Michaels III PT, DPT 09/14/22 8:33 AM

## 2022-09-16 ENCOUNTER — Ambulatory Visit (HOSPITAL_BASED_OUTPATIENT_CLINIC_OR_DEPARTMENT_OTHER): Payer: Medicare HMO | Admitting: Physical Therapy

## 2022-09-18 ENCOUNTER — Ambulatory Visit (HOSPITAL_BASED_OUTPATIENT_CLINIC_OR_DEPARTMENT_OTHER): Payer: Medicare HMO | Admitting: Physical Therapy

## 2022-09-23 ENCOUNTER — Other Ambulatory Visit: Payer: Self-pay | Admitting: Family Medicine

## 2022-09-24 ENCOUNTER — Other Ambulatory Visit: Payer: Self-pay | Admitting: Family Medicine

## 2022-09-29 ENCOUNTER — Ambulatory Visit (INDEPENDENT_AMBULATORY_CARE_PROVIDER_SITE_OTHER): Payer: Medicare HMO

## 2022-09-29 VITALS — Wt 156.0 lb

## 2022-09-29 DIAGNOSIS — Z0000 Encounter for general adult medical examination without abnormal findings: Secondary | ICD-10-CM

## 2022-09-29 NOTE — Progress Notes (Signed)
I connected with  Danielle Rocha on 09/29/22 by a audio enabled telemedicine application and verified that I am speaking with the correct person using two identifiers.  Patient Location: Home  Provider Location: Office/Clinic  I discussed the limitations of evaluation and management by telemedicine. The patient expressed understanding and agreed to proceed.   Subjective:   Danielle Rocha is a 72 y.o. female who presents for Medicare Annual (Subsequent) preventive examination.  Review of Systems     Cardiac Risk Factors include: advanced age (>91men, >39 women)     Objective:    Today's Vitals   09/29/22 1511  Weight: 156 lb (70.8 kg)   Body mass index is 23.72 kg/m.     09/29/2022    3:15 PM 07/28/2022   11:29 AM 05/23/2022    9:16 AM 05/19/2022    1:41 PM 01/24/2022   11:25 AM 01/05/2022    9:11 AM 12/19/2021    7:25 PM  Advanced Directives  Does Patient Have a Medical Advance Directive? No Yes No No No No No  Would patient like information on creating a medical advance directive? No - Patient declined  No - Patient declined   No - Patient declined No - Patient declined    Current Medications (verified) Outpatient Encounter Medications as of 09/29/2022  Medication Sig   acetaminophen (TYLENOL) 500 MG tablet Take 1,000 mg by mouth every 6 (six) hours as needed for mild pain, moderate pain, fever or headache.   buPROPion (WELLBUTRIN XL) 150 MG 24 hr tablet Take 300 mg by mouth daily.   cephALEXin (KEFLEX) 250 MG capsule Take 250 mg by mouth daily.   Cholecalciferol (VITAMIN D) 125 MCG (5000 UT) CAPS Take 5,000 Units by mouth daily.   Coenzyme Q10 200 MG TABS Take 200 mg by mouth daily.   Cyanocobalamin (B-12 PO) Take 1 tablet by mouth daily.   Erenumab-aooe (AIMOVIG) 140 MG/ML SOAJ Inject 140 mg into the skin every 28 (twenty-eight) days.   flecainide (TAMBOCOR) 100 MG tablet Take 1 tablet by mouth twice daily   FLUZONE HIGH-DOSE QUADRIVALENT 0.7 ML SUSY    Magnesium Oxide  420 MG TABS Take 420 mg by mouth daily.   melatonin 5 MG TABS Take 5 mg by mouth at bedtime.   Multiple Vitamin (MULTIVITAMIN WITH MINERALS) TABS tablet Take 1 tablet by mouth daily.   ondansetron (ZOFRAN ODT) 4 MG disintegrating tablet Take 1 tablet (4 mg total) by mouth every 8 (eight) hours as needed for nausea or vomiting.   pantoprazole (PROTONIX) 20 MG tablet Take 1 tablet (20 mg total) by mouth 2 (two) times daily before a meal. Please schedule an office visit for further refills. Thank you   pyridOXINE (VITAMIN B6) 100 MG tablet Take 100 mg by mouth daily.   rosuvastatin (CRESTOR) 20 MG tablet Take 1 tablet by mouth once daily   sertraline (ZOLOFT) 50 MG tablet Take 1 tablet (50 mg total) by mouth daily. (Patient taking differently: Take 50 mg by mouth in the morning and at bedtime.)   SUMAtriptan (IMITREX) 100 MG tablet TAKE 1 TABLET BY MOUTH AS NEEDED FOR UP TO 1 DOSE FOR MIGRAINE   traZODone (DESYREL) 50 MG tablet TAKE 1/2 TO 1 (ONE-HALF TO ONE) TABLET BY MOUTH AT BEDTIME AS NEEDED FOR SLEEP   XARELTO 20 MG TABS tablet TAKE 1 TABLET BY MOUTH ONCE DAILY WITH SUPPER   [DISCONTINUED] diazepam (VALIUM) 5 MG tablet Take 1 by mouth 1 hour  pre-procedure with very light food.  May bring 2nd tablet to appointment.   [DISCONTINUED] zolpidem (AMBIEN) 10 MG tablet Take 10 mg by mouth at bedtime as needed for sleep.   No facility-administered encounter medications on file as of 09/29/2022.    Allergies (verified) Reglan [metoclopramide], Ciprofloxacin, Clindamycin/lincomycin, Doxycycline, Macrobid [nitrofurantoin monohyd macro], and Sulfa antibiotics   History: Past Medical History:  Diagnosis Date   Anemia    Arthritis    DJD, low back, thumb   Atrial fibrillation (HCC)    Xarelto anticoagulation. Flecainide antiarrythmic    Chicken pox    Chronic pain syndrome    On disability. History of bilateral hip pain, low back pain. Gabapentin 400 BID, percocet 1 tablet daily per prior provider.     Colon polyp    awaiting records   Depression    zoloft 100mg , remeron 30mg  per psychiatry. ambien 10mg  per psychiatry to help wtih sleep element.    Dysrhythmia    Dystonia    described as psychogenic dystonia. Pain and twisting from upper chest and up with triggers "wind, creamy food" on TID ativan per psychiatry previously.    Fibromyalgia    GERD (gastroesophageal reflux disease)    omeprazole OTC   Goiter    states multiple imaging tests, has had biopsies   Hyperlipidemia    lovastatin 20mg    Stroke (HCC)    TIA (left side of face and body decreased sensitivity than right face and side)   TIA (transient ischemic attack)    Vaginal atrophy    estrace vaginal cream   Past Surgical History:  Procedure Laterality Date   ATRIAL FIBRILLATION ABLATION     BIOPSY THYROID     fusion l4-l5  09/22/1998   GLUTEUS MINIMUS REPAIR Right 05/19/2022   Procedure: RIGHT HIP GLUTEUS MEDIUS TENDON REPAIR;  Surgeon: , MD;  Location: [facility] OR;  Service: Orthopedics;  Laterality: Right;   LAMINECTOMY     L4-L5   OTHER SURGICAL HISTORY     tennis elbow surgery   piriformis release  09/22/1997   right hip   TONSILLECTOMY AND ADENOIDECTOMY  age 61   VAGINAL HYSTERECTOMY  09/23/1991   Family History  Problem Relation Age of Onset   Alcohol abuse Mother        possible she had stomach cancer as well but unsure   Hypertension Father    Alcohol abuse Father    Pancreatic cancer Father    Cancer Brother        spindle cell RLE   Diabetes Brother    Cancer Brother        tonsil cancer   Skin cancer Brother    Colon cancer Neg Hx    Colon polyps Neg Hx    Stomach cancer Neg Hx    Social History   Socioeconomic History   Marital status: Married    Spouse name: Not on file   Number of children: 3   Years of education: 13   Highest education level: Not on file  Occupational History   Occupation: retired  Tobacco Use   Smoking status: Never   Smokeless tobacco: Never   Vaping Use   Vaping Use: Never used  Substance and Sexual Activity   Alcohol use: No    Alcohol/week: 0.0 standard drinks of alcohol   Drug use: No   Sexual activity: Not Currently    Partners: Male  Other Topics Concern   Not on file  Social History Narrative   Family: Married. Husband works as [employer]  for [employer]. 2 children from previous marriage 1 from current. Son Jeannett Senior lives with them and has down's syndrome.       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 Moved to [facility] [place] [place] from [id number] and became disabled in that time frame due to hip/back issues.       Hobbies: time with son   Right handed   One story home   Caffeine occasionally   Social Determinants of Health   Financial Resource Strain: Low Risk  (09/29/2022)   Overall Financial Resource Strain (CARDIA)    Difficulty of Paying Living Expenses: Not hard at all  Food Insecurity: No Food Insecurity (09/29/2022)   Hunger Vital Sign    Worried About Running Out of Food in the Last Year: Never true    Ran Out of Food in the Last Year: Never true  Transportation Needs: No Transportation Needs (09/29/2022)   PRAPARE - Administrator, Civil Service (Medical): No    Lack of Transportation (Non-Medical): No  Physical Activity: Insufficiently Active (09/29/2022)   Exercise Vital Sign    Days of Exercise per Week: 3 days    Minutes of Exercise per Session: 30 min  Stress: No Stress Concern Present (09/29/2022)   [employer] of Occupational Health - Occupational Stress Questionnaire    Feeling of Stress : Not at all  Social Connections: Moderately Integrated (09/29/2022)   Social Connection and Isolation Panel [NHANES]    Frequency of Communication with Friends and Family: More than three times a week    Frequency of Social Gatherings with Friends and Family: More than three times a week    Attends Religious Services: More than 4 times per year    Active Member of [employer] or  Organizations: No    Attends Engineer, structural: Never    Marital Status: Married    Tobacco Counseling Counseling given: Not Answered   Clinical Intake:  Pre-visit preparation completed: Yes  Pain : No/denies pain     BMI - recorded: 23.72 Nutritional Status: BMI of 19-24  Normal Nutritional Risks: None Diabetes: No  How often do you need to have someone help you when you read instructions, pamphlets, or other written materials from your doctor or pharmacy?: 1 - Never  Diabetic?no  Interpreter Needed?: No  Information entered by :: Lanier Ensign, LPN   Activities of Daily Living    09/29/2022    3:16 PM 05/19/2022    1:47 PM  In your present state of health, do you have any difficulty performing the following activities:  Hearing? 0   Vision? 0   Difficulty concentrating or making decisions? 0   Walking or climbing stairs? 0   Dressing or bathing? 0   Doing errands, shopping? 0 0  Preparing Food and eating ? N   Using the Toilet? N   In the past six months, have you accidently leaked urine? N   Do you have problems with loss of bowel control? N   Managing your Medications? N   Managing your Finances? N   Housekeeping or managing your Housekeeping? N     Patient Care Team: Shelva Majestic, MD as PCP - General (Family Medicine) Pricilla Riffle, MD as PCP - Cardiology (Cardiology) Alfredo Martinez, MD as Consulting Physician (Urology) Pricilla Riffle, MD as Consulting Physician (Cardiology) Archer Asa, MD as Consulting Physician (Psychiatry) Erroll Luna, [facility] as Pharmacist (Pharmacist)  Indicate any recent Medical Services you may have received  from other than [facility] providers in the past year (date may be approximate).     Assessment:   This is a routine wellness examination for Danielle Rocha.  Hearing/Vision screen Hearing Screening - Comments:: Pt wears hearing aids  Vision Screening - Comments:: Pt follows up with [facility] for annual eye exams   Dietary issues and exercise activities discussed: Current Exercise Habits: Home exercise routine, Type of exercise: walking, Time (Minutes): 30, Frequency (Times/Week): 3, Weekly Exercise (Minutes/Week): 90   Goals Addressed             This Visit's Progress    Patient Stated       Stay active        Depression Screen    09/29/2022    3:14 PM 10/17/2021    8:08 AM 09/09/2021    2:40 PM 06/06/2021   10:02 AM 09/06/2020   10:40 AM 02/07/2020    1:14 PM 10/28/2019    7:56 AM  PHQ 2/9 Scores  PHQ - 2 Score 0 2 0 2 1 2 1   PHQ- 9 Score 0 12  15  5 3     Fall Risk    09/29/2022    3:16 PM 07/28/2022   11:28 AM 01/24/2022   11:24 AM 09/09/2021    2:43 PM 07/30/2021   10:17 AM  Fall Risk   Falls in the past year? 0 1 0 1 0  Number falls in past yr: 0 1 0 1   Injury with Fall? 0 0 0 1   Comment    ankle sprain   Risk for fall due to : Impaired vision   Impaired vision No Fall Risks  Follow up Falls prevention discussed Falls evaluation completed  Falls prevention discussed     FALL RISK PREVENTION PERTAINING TO THE HOME:  Any stairs in or around the home? Yes  If so, are there any without handrails? No  Home free of loose throw rugs in walkways, pet beds, electrical cords, etc? Yes  Adequate lighting in your home to reduce risk of falls? Yes   ASSISTIVE DEVICES UTILIZED TO PREVENT FALLS:  Life alert? No  Use of a cane, walker or w/c? No  Grab bars in the bathroom? No  Shower chair or bench in shower? Yes  Elevated toilet seat or a handicapped toilet? No   TIMED UP AND GO:  Was the test performed? No .  Cognitive Function:      06/14/2021    8:00 AM  Montreal Cognitive Assessment   Visuospatial/ Executive (0/5) 3  Naming (0/3) 3  Attention: Read list of digits (0/2) 2  Attention: Read list of letters (0/1) 1  Attention: Serial 7 subtraction starting at 100 (0/3) 3  Language: Repeat phrase (0/2) 1  Language : Fluency (0/1) 0   Abstraction (0/2) 1  Delayed Recall (0/5) 2  Orientation (0/6) 6  Total 22      09/09/2021    2:45 PM 09/06/2020   10:48 AM 07/29/2018    1:37 PM  6CIT Screen  What Year? 0 points 0 points 0 points  What month? 0 points 0 points 0 points  What time? 0 points  0 points  Count back from 20 0 points 0 points 0 points  Months in reverse 0 points 2 points 0 points  Repeat phrase 0 points 2 points 0 points  Total Score 0 points  0 points    Immunizations Immunization History  Administered Date(s) Administered  Fluad Quad(high Dose 65+) 07/24/2020, 06/06/2021   Influenza, High Dose Seasonal PF 07/29/2017, 05/27/2019, 07/22/2022   Influenza-Unspecified 06/09/2013, 06/06/2014, 07/04/2015, 07/26/2016, 07/26/2018   PFIZER(Purple Top)SARS-COV-2 Vaccination 11/13/2019, 12/07/2019   Pneumococcal Conjugate-13 04/27/2017   Pneumococcal Polysaccharide-23 09/23/2012, 10/28/2018   Td 09/23/2012   Tdap 06/13/2012   Zoster Recombinat (Shingrix) 09/21/2021   Zoster, Live 08/16/2012    TDAP status: Due, Education has been provided regarding the importance of this vaccine. Advised may receive this vaccine at local pharmacy or Health Dept. Aware to provide a copy of the vaccination record if obtained from local pharmacy or Health Dept. Verbalized acceptance and understanding.  Flu Vaccine status: Up to date  Pneumococcal vaccine status: Up to date  Covid-19 vaccine status: Completed vaccines  Qualifies for Shingles Vaccine? Yes   Zostavax completed Yes   Shingrix Completed?: Yes  Screening Tests Health Maintenance  Topic Date Due   COVID-19 Vaccine (3 - Pfizer risk series) 01/04/2020   Zoster Vaccines- Shingrix (2 of 2) 11/16/2021   DTaP/Tdap/Td (3 - Td or Tdap) 09/23/2022   Hepatitis C Screening  06/22/2056 (Originally 04/24/1969)   MAMMOGRAM  08/28/2023   Medicare Annual Wellness (AWV)  09/30/2023   COLONOSCOPY (Pts 45-47yrs Insurance coverage will need to be confirmed)  01/01/2030    Pneumonia Vaccine 85+ Years old  Completed   INFLUENZA VACCINE  Completed   DEXA SCAN  Completed   HPV VACCINES  Aged Out    Health Maintenance  Health Maintenance Due  Topic Date Due   COVID-19 Vaccine (3 - Pfizer risk series) 01/04/2020   Zoster Vaccines- Shingrix (2 of 2) 11/16/2021   DTaP/Tdap/Td (3 - Td or Tdap) 09/23/2022    Colorectal cancer screening: Type of screening: Colonoscopy. Completed 01/02/20. Repeat every 10 years  Mammogram status: Completed 08/27/21. Repeat every year  Bone Density status: Completed 10/31/15. Results reflect: Bone density results: NORMAL. Repeat every 2 years.   Additional Screening:  Hepatitis C Screening: does qualify;  Vision Screening: Recommended annual ophthalmology exams for early detection of glaucoma and other disorders of the eye. Is the patient up to date with their annual eye exam?  Yes  Who is the provider or what is the name of the office in which the patient attends annual eye exams? [facility]  If pt is not established with a provider, would they like to be referred to a provider to establish care? No .   Dental Screening: Recommended annual dental exams for proper oral hygiene  Community Resource Referral / Chronic Care Management: CRR required this visit?  No   CCM required this visit?  No      Plan:     I have personally reviewed and noted the following in the patient's chart:   Medical and social history Use of alcohol, tobacco or illicit drugs  Current medications and supplements including opioid prescriptions. Patient is not currently taking opioid prescriptions. Functional ability and status Nutritional status Physical activity Advanced directives List of other physicians Hospitalizations, surgeries, and ER visits in previous 12 months Vitals Screenings to include cognitive, depression, and falls Referrals and appointments  In addition, I have reviewed and discussed with patient certain preventive  protocols, quality metrics, and best practice recommendations. A written personalized care plan for preventive services as well as general preventive health recommendations were provided to patient.     Marzella Schlein, LPN   05/28/1883   Nurse Notes: none

## 2022-09-29 NOTE — Patient Instructions (Signed)
Danielle Rocha , Thank you for taking time to come for your Medicare Wellness Visit. I appreciate your ongoing commitment to your health goals. Please review the following plan we discussed and let me know if I can assist you in the future.   These are the goals we discussed:  Goals      Manage My Medicine     Timeframe:  Long-Range Goal Priority:  High Start Date:    11/29/21                         Expected End Date:   06/01/22                    Follow Up Date 03/01/22    Increase exercise focus on adherence to meet LDL < 70 goal.    Why is this important?   These steps will help you keep on track with your medicines.   Notes:      Patient Stated     Like some communication from her oldest son      Patient Stated     None at this time     Patient Stated     None at this time     Patient Stated     Stay active         This is a list of the screening recommended for you and due dates:  Health Maintenance  Topic Date Due   COVID-19 Vaccine (3 - Pfizer risk series) 01/04/2020   Zoster (Shingles) Vaccine (2 of 2) 11/16/2021   DTaP/Tdap/Td vaccine (3 - Td or Tdap) 09/23/2022   Hepatitis C Screening: USPSTF Recommendation to screen - Ages 18-79 yo.  06/22/2056*   Mammogram  08/28/2023   Medicare Annual Wellness Visit  09/30/2023   Colon Cancer Screening  01/01/2030   Pneumonia Vaccine  Completed   Flu Shot  Completed   DEXA scan (bone density measurement)  Completed   HPV Vaccine  Aged Out  *Topic was postponed. The date shown is not the original due date.    Advanced directives: Advance directive discussed with you today. Even though you declined this today please call our office should you change your mind and we can give you the proper paperwork for you to fill out.  Conditions/risks identified: stay active   Next appointment: Follow up in one year for your annual wellness visit    Preventive Care 65 Years and Older, Female Preventive care refers to lifestyle  choices and visits with your health care provider that can promote health and wellness. What does preventive care include? A yearly physical exam. This is also called an annual well check. Dental exams once or twice a year. Routine eye exams. Ask your health care provider how often you should have your eyes checked. Personal lifestyle choices, including: Daily care of your teeth and gums. Regular physical activity. Eating a healthy diet. Avoiding tobacco and drug use. Limiting alcohol use. Practicing safe sex. Taking low-dose aspirin every day. Taking vitamin and mineral supplements as recommended by your health care provider. What happens during an annual well check? The services and screenings done by your health care provider during your annual well check will depend on your age, overall health, lifestyle risk factors, and family history of disease. Counseling  Your health care provider may ask you questions about your: Alcohol use. Tobacco use. Drug use. Emotional well-being. Home and relationship well-being. Sexual activity. Eating habits. History  of falls. Memory and ability to understand (cognition). Work and work Astronomer. Reproductive health. Screening  You may have the following tests or measurements: Height, weight, and BMI. Blood pressure. Lipid and cholesterol levels. These may be checked every 5 years, or more frequently if you are over 28 years old. Skin check. Lung cancer screening. You may have this screening every year starting at age 21 if you have a 30-pack-year history of smoking and currently smoke or have quit within the past 15 years. Fecal occult blood test (FOBT) of the stool. You may have this test every year starting at age 43. Flexible sigmoidoscopy or colonoscopy. You may have a sigmoidoscopy every 5 years or a colonoscopy every 10 years starting at age 52. Hepatitis C blood test. Hepatitis B blood test. Sexually transmitted disease (STD)  testing. Diabetes screening. This is done by checking your blood sugar (glucose) after you have not eaten for a while (fasting). You may have this done every 1-3 years. Bone density scan. This is done to screen for osteoporosis. You may have this done starting at age 53. Mammogram. This may be done every 1-2 years. Talk to your health care provider about how often you should have regular mammograms. Talk with your health care provider about your test results, treatment options, and if necessary, the need for more tests. Vaccines  Your health care provider may recommend certain vaccines, such as: Influenza vaccine. This is recommended every year. Tetanus, diphtheria, and acellular pertussis (Tdap, Td) vaccine. You may need a Td booster every 10 years. Zoster vaccine. You may need this after age 70. Pneumococcal 13-valent conjugate (PCV13) vaccine. One dose is recommended after age 8. Pneumococcal polysaccharide (PPSV23) vaccine. One dose is recommended after age 8. Talk to your health care provider about which screenings and vaccines you need and how often you need them. This information is not intended to replace advice given to you by your health care provider. Make sure you discuss any questions you have with your health care provider. Document Released: 10/05/2015 Document Revised: 05/28/2016 Document Reviewed: 07/10/2015 Elsevier Interactive Patient Education  2017 [employer].  Fall Prevention in the Home Falls can cause injuries. They can happen to people of all ages. There are many things you can do to make your home safe and to help prevent falls. What can I do on the outside of my home? Regularly fix the edges of walkways and driveways and fix any cracks. Remove anything that might make you trip as you walk through a door, such as a raised step or threshold. Trim any bushes or trees on the path to your home. Use bright outdoor lighting. Clear any walking paths of anything that  might make someone trip, such as rocks or tools. Regularly check to see if handrails are loose or broken. Make sure that both sides of any steps have handrails. Any raised decks and porches should have guardrails on the edges. Have any leaves, snow, or ice cleared regularly. Use sand or salt on walking paths during winter. Clean up any spills in your garage right away. This includes oil or grease spills. What can I do in the bathroom? Use night lights. Install grab bars by the toilet and in the tub and shower. Do not use towel bars as grab bars. Use non-skid mats or decals in the tub or shower. If you need to sit down in the shower, use a plastic, non-slip stool. Keep the floor dry. Clean up any water that spills on  the floor as soon as it happens. Remove soap buildup in the tub or shower regularly. Attach bath mats securely with double-sided non-slip rug tape. Do not have throw rugs and other things on the floor that can make you trip. What can I do in the bedroom? Use night lights. Make sure that you have a light by your bed that is easy to reach. Do not use any sheets or blankets that are too big for your bed. They should not hang down onto the floor. Have a firm chair that has side arms. You can use this for support while you get dressed. Do not have throw rugs and other things on the floor that can make you trip. What can I do in the kitchen? Clean up any spills right away. Avoid walking on wet floors. Keep items that you use a lot in easy-to-reach places. If you need to reach something above you, use a strong step stool that has a grab bar. Keep electrical cords out of the way. Do not use floor polish or wax that makes floors slippery. If you must use wax, use non-skid floor wax. Do not have throw rugs and other things on the floor that can make you trip. What can I do with my stairs? Do not leave any items on the stairs. Make sure that there are handrails on both sides of the  stairs and use them. Fix handrails that are broken or loose. Make sure that handrails are as long as the stairways. Check any carpeting to make sure that it is firmly attached to the stairs. Fix any carpet that is loose or worn. Avoid having throw rugs at the top or bottom of the stairs. If you do have throw rugs, attach them to the floor with carpet tape. Make sure that you have a light switch at the top of the stairs and the bottom of the stairs. If you do not have them, ask someone to add them for you. What else can I do to help prevent falls? Wear shoes that: Do not have high heels. Have rubber bottoms. Are comfortable and fit you well. Are closed at the toe. Do not wear sandals. If you use a stepladder: Make sure that it is fully opened. Do not climb a closed stepladder. Make sure that both sides of the stepladder are locked into place. Ask someone to hold it for you, if possible. Clearly mark and make sure that you can see: Any grab bars or handrails. First and last steps. Where the edge of each step is. Use tools that help you move around (mobility aids) if they are needed. These include: Canes. Walkers. Scooters. Crutches. Turn on the lights when you go into a dark area. Replace any light bulbs as soon as they burn out. Set up your furniture so you have a clear path. Avoid moving your furniture around. If any of your floors are uneven, fix them. If there are any pets around you, be aware of where they are. Review your medicines with your doctor. Some medicines can make you feel dizzy. This can increase your chance of falling. Ask your doctor what other things that you can do to help prevent falls. This information is not intended to replace advice given to you by your health care provider. Make sure you discuss any questions you have with your health care provider. Document Released: 07/05/2009 Document Revised: 02/14/2016 Document Reviewed: 10/13/2014 Elsevier Interactive  Patient Education  2017 [employer].

## 2022-09-30 NOTE — Progress Notes (Signed)
I connected with  Danielle Rocha on 09/30/22 by a audio enabled telemedicine application and verified that I am speaking with the correct person using two identifiers.  Patient Location: Home  Provider Location: Office/Clinic  I discussed the limitations of evaluation and management by telemedicine. The patient expressed understanding and agreed to proceed.   Subjective:   Danielle Rocha is a 72 y.o. female who presents for Medicare Annual (Subsequent) preventive examination.  Review of Systems     Cardiac Risk Factors include: advanced age (>85men, >68 women)     Objective:    Today's Vitals   09/29/22 1511  Weight: 156 lb (70.8 kg)   Body mass index is 23.72 kg/m.     09/29/2022    3:15 PM 07/28/2022   11:29 AM 05/23/2022    9:16 AM 05/19/2022    1:41 PM 01/24/2022   11:25 AM 01/05/2022    9:11 AM 12/19/2021    7:25 PM  Advanced Directives  Does Patient Have a Medical Advance Directive? No Yes No No No No No  Would patient like information on creating a medical advance directive? No - Patient declined  No - Patient declined   No - Patient declined No - Patient declined    Current Medications (verified) Outpatient Encounter Medications as of 09/29/2022  Medication Sig   acetaminophen (TYLENOL) 500 MG tablet Take 1,000 mg by mouth every 6 (six) hours as needed for mild pain, moderate pain, fever or headache.   buPROPion (WELLBUTRIN XL) 150 MG 24 hr tablet Take 300 mg by mouth daily.   cephALEXin (KEFLEX) 250 MG capsule Take 250 mg by mouth daily.   Cholecalciferol (VITAMIN D) 125 MCG (5000 UT) CAPS Take 5,000 Units by mouth daily.   Coenzyme Q10 200 MG TABS Take 200 mg by mouth daily.   Cyanocobalamin (B-12 PO) Take 1 tablet by mouth daily.   Erenumab-aooe (AIMOVIG) 140 MG/ML SOAJ Inject 140 mg into the skin every 28 (twenty-eight) days.   flecainide (TAMBOCOR) 100 MG tablet Take 1 tablet by mouth twice daily   FLUZONE HIGH-DOSE QUADRIVALENT 0.7 ML SUSY    Magnesium Oxide  420 MG TABS Take 420 mg by mouth daily.   melatonin 5 MG TABS Take 5 mg by mouth at bedtime.   Multiple Vitamin (MULTIVITAMIN WITH MINERALS) TABS tablet Take 1 tablet by mouth daily.   ondansetron (ZOFRAN ODT) 4 MG disintegrating tablet Take 1 tablet (4 mg total) by mouth every 8 (eight) hours as needed for nausea or vomiting.   pantoprazole (PROTONIX) 20 MG tablet Take 1 tablet (20 mg total) by mouth 2 (two) times daily before a meal. Please schedule an office visit for further refills. Thank you   pyridOXINE (VITAMIN B6) 100 MG tablet Take 100 mg by mouth daily.   rosuvastatin (CRESTOR) 20 MG tablet Take 1 tablet by mouth once daily   sertraline (ZOLOFT) 50 MG tablet Take 1 tablet (50 mg total) by mouth daily. (Patient taking differently: Take 50 mg by mouth in the morning and at bedtime.)   SUMAtriptan (IMITREX) 100 MG tablet TAKE 1 TABLET BY MOUTH AS NEEDED FOR UP TO 1 DOSE FOR MIGRAINE   traZODone (DESYREL) 50 MG tablet TAKE 1/2 TO 1 (ONE-HALF TO ONE) TABLET BY MOUTH AT BEDTIME AS NEEDED FOR SLEEP   XARELTO 20 MG TABS tablet TAKE 1 TABLET BY MOUTH ONCE DAILY WITH SUPPER   [DISCONTINUED] diazepam (VALIUM) 5 MG tablet Take 1 by mouth 1 hour  pre-procedure with very light food.  May bring 2nd tablet to appointment.   [DISCONTINUED] zolpidem (AMBIEN) 10 MG tablet Take 10 mg by mouth at bedtime as needed for sleep.   No facility-administered encounter medications on file as of 09/29/2022.    Allergies (verified) Reglan [metoclopramide], Ciprofloxacin, Clindamycin/lincomycin, Doxycycline, Macrobid [nitrofurantoin monohyd macro], and Sulfa antibiotics   History: Past Medical History:  Diagnosis Date   Anemia    Arthritis    DJD, low back, thumb   Atrial fibrillation (HCC)    Xarelto anticoagulation. Flecainide antiarrythmic    Chicken pox    Chronic pain syndrome    On disability. History of bilateral hip pain, low back pain. Gabapentin 400 BID, percocet 1 tablet daily per prior provider.     Colon polyp    awaiting records   Depression    zoloft 100mg , remeron 30mg  per psychiatry. ambien 10mg  per psychiatry to help wtih sleep element.    Dysrhythmia    Dystonia    described as psychogenic dystonia. Pain and twisting from upper chest and up with triggers "wind, creamy food" on TID ativan per psychiatry previously.    Fibromyalgia    GERD (gastroesophageal reflux disease)    omeprazole OTC   Goiter    states multiple imaging tests, has had biopsies   Hyperlipidemia    lovastatin 20mg    Stroke (HCC)    TIA (left side of face and body decreased sensitivity than right face and side)   TIA (transient ischemic attack)    Vaginal atrophy    estrace vaginal cream   Past Surgical History:  Procedure Laterality Date   ATRIAL FIBRILLATION ABLATION     BIOPSY THYROID     fusion l4-l5  09/22/1998   GLUTEUS MINIMUS REPAIR Right 05/19/2022   Procedure: RIGHT HIP GLUTEUS MEDIUS TENDON REPAIR;  Surgeon: , MD;  Location: [facility] OR;  Service: Orthopedics;  Laterality: Right;   LAMINECTOMY     L4-L5   OTHER SURGICAL HISTORY     tennis elbow surgery   piriformis release  09/22/1997   right hip   TONSILLECTOMY AND ADENOIDECTOMY  age 43   VAGINAL HYSTERECTOMY  09/23/1991   Family History  Problem Relation Age of Onset   Alcohol abuse Mother        possible she had stomach cancer as well but unsure   Hypertension Father    Alcohol abuse Father    Pancreatic cancer Father    Cancer Brother        spindle cell RLE   Diabetes Brother    Cancer Brother        tonsil cancer   Skin cancer Brother    Colon cancer Neg Hx    Colon polyps Neg Hx    Stomach cancer Neg Hx    Social History   Socioeconomic History   Marital status: Married    Spouse name: Not on file   Number of children: 3   Years of education: 13   Highest education level: Not on file  Occupational History   Occupation: retired  Tobacco Use   Smoking status: Never   Smokeless tobacco: Never   Vaping Use   Vaping Use: Never used  Substance and Sexual Activity   Alcohol use: No    Alcohol/week: 0.0 standard drinks of alcohol   Drug use: No   Sexual activity: Not Currently    Partners: Male  Other Topics Concern   Not on file  Social History Narrative   Family: Married. Husband works as [employer]  for [employer]. 2 children from previous marriage 1 from current. Son Jeannett Senior lives with them and has down's syndrome.       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 Moved to [facility] from [id number] and became disabled in that time frame due to hip/back issues.       Hobbies: time with son   Right handed   One story home   Caffeine occasionally   Social Determinants of Health   Financial Resource Strain: Low Risk  (09/29/2022)   Overall Financial Resource Strain (CARDIA)    Difficulty of Paying Living Expenses: Not hard at all  Food Insecurity: No Food Insecurity (09/29/2022)   Hunger Vital Sign    Worried About Running Out of Food in the Last Year: Never true    Ran Out of Food in the Last Year: Never true  Transportation Needs: No Transportation Needs (09/29/2022)   PRAPARE - Administrator, Civil Service (Medical): No    Lack of Transportation (Non-Medical): No  Physical Activity: Insufficiently Active (09/29/2022)   Exercise Vital Sign    Days of Exercise per Week: 3 days    Minutes of Exercise per Session: 30 min  Stress: No Stress Concern Present (09/29/2022)   [employer] of Occupational Health - Occupational Stress Questionnaire    Feeling of Stress : Not at all  Social Connections: Moderately Integrated (09/29/2022)   Social Connection and Isolation Panel [NHANES]    Frequency of Communication with Friends and Family: More than three times a week    Frequency of Social Gatherings with Friends and Family: More than three times a week    Attends Religious Services: More than 4 times per year    Active Member of [employer] or  Organizations: No    Attends Engineer, structural: Never    Marital Status: Married    Tobacco Counseling Counseling given: Not Answered   Clinical Intake:  Pre-visit preparation completed: Yes  Pain : No/denies pain     BMI - recorded: 23.72 Nutritional Status: BMI of 19-24  Normal Nutritional Risks: None Diabetes: No  How often do you need to have someone help you when you read instructions, pamphlets, or other written materials from your doctor or pharmacy?: 1 - Never  Diabetic?no  Interpreter Needed?: No  Information entered by :: Lanier Ensign, LPN   Activities of Daily Living    09/29/2022    3:16 PM 05/19/2022    1:47 PM  In your present state of health, do you have any difficulty performing the following activities:  Hearing? 0   Vision? 0   Difficulty concentrating or making decisions? 0   Walking or climbing stairs? 0   Dressing or bathing? 0   Doing errands, shopping? 0 0  Preparing Food and eating ? N   Using the Toilet? N   In the past six months, have you accidently leaked urine? N   Do you have problems with loss of bowel control? N   Managing your Medications? N   Managing your Finances? N   Housekeeping or managing your Housekeeping? N     Patient Care Team: Shelva Majestic, MD as PCP - General (Family Medicine) Pricilla Riffle, MD as PCP - Cardiology (Cardiology) Alfredo Martinez, MD as Consulting Physician (Urology) Pricilla Riffle, MD as Consulting Physician (Cardiology) Archer Asa, MD as Consulting Physician (Psychiatry) Erroll Luna, [facility] as Pharmacist (Pharmacist)  Indicate any recent Medical Services you may have received  from other than [facility] providers in the past year (date may be approximate).     Assessment:   This is a routine wellness examination for [place].  Hearing/Vision screen Hearing Screening - Comments:: Pt wears hearing aids  Vision Screening - Comments:: Pt follows up with [facility] for annual eye exams   Dietary issues and exercise activities discussed: Current Exercise Habits: Home exercise routine, Type of exercise: walking, Time (Minutes): 30, Frequency (Times/Week): 3, Weekly Exercise (Minutes/Week): 90   Goals Addressed             This Visit's Progress    Patient Stated       Stay active       Depression Screen    09/29/2022    3:14 PM 10/17/2021    8:08 AM 09/09/2021    2:40 PM 06/06/2021   10:02 AM 09/06/2020   10:40 AM 02/07/2020    1:14 PM 10/28/2019    7:56 AM  PHQ 2/9 Scores  PHQ - 2 Score 0 2 0 2 1 2 1   PHQ- 9 Score 0 12  15  5 3     Fall Risk    09/29/2022    3:16 PM 07/28/2022   11:28 AM 01/24/2022   11:24 AM 09/09/2021    2:43 PM 07/30/2021   10:17 AM  Fall Risk   Falls in the past year? 0 1 0 1 0  Number falls in past yr: 0 1 0 1   Injury with Fall? 0 0 0 1   Comment    ankle sprain   Risk for fall due to : Impaired vision   Impaired vision No Fall Risks  Follow up Falls prevention discussed Falls evaluation completed  Falls prevention discussed     FALL RISK PREVENTION PERTAINING TO THE HOME:  Any stairs in or around the home? Yes  If so, are there any without handrails? No  Home free of loose throw rugs in walkways, pet beds, electrical cords, etc? Yes  Adequate lighting in your home to reduce risk of falls? Yes   ASSISTIVE DEVICES UTILIZED TO PREVENT FALLS:  Life alert? No  Use of a cane, walker or w/c? No  Grab bars in the bathroom? No  Shower chair or bench in shower? Yes  Elevated toilet seat or a handicapped toilet? No   TIMED UP AND GO:  Was the test performed? No .  Cognitive Function: Pt declined 6 CIT stating she has done this with her neurologist       06/14/2021    8:00 AM  Montreal Cognitive Assessment   Visuospatial/ Executive (0/5) 3  Naming (0/3) 3  Attention: Read list of digits (0/2) 2  Attention: Read list of letters (0/1) 1  Attention: Serial 7 subtraction starting at 100 (0/3) 3   Language: Repeat phrase (0/2) 1  Language : Fluency (0/1) 0  Abstraction (0/2) 1  Delayed Recall (0/5) 2  Orientation (0/6) 6  Total 22      09/09/2021    2:45 PM 09/06/2020   10:48 AM 07/29/2018    1:37 PM  6CIT Screen  What Year? 0 points 0 points 0 points  What month? 0 points 0 points 0 points  What time? 0 points  0 points  Count back from 20 0 points 0 points 0 points  Months in reverse 0 points 2 points 0 points  Repeat phrase 0 points 2 points 0 points  Total Score 0 points  0 points  Immunizations Immunization History  Administered Date(s) Administered   Fluad Quad(high Dose 65+) 07/24/2020, 06/06/2021   Influenza, High Dose Seasonal PF 07/29/2017, 05/27/2019, 07/22/2022   Influenza-Unspecified 06/09/2013, 06/06/2014, 07/04/2015, 07/26/2016, 07/26/2018   PFIZER(Purple Top)SARS-COV-2 Vaccination 11/13/2019, 12/07/2019   Pneumococcal Conjugate-13 04/27/2017   Pneumococcal Polysaccharide-23 09/23/2012, 10/28/2018   Td 09/23/2012   Tdap 06/13/2012   Zoster Recombinat (Shingrix) 09/21/2021   Zoster, Live 08/16/2012    TDAP status: Due, Education has been provided regarding the importance of this vaccine. Advised may receive this vaccine at local pharmacy or Health Dept. Aware to provide a copy of the vaccination record if obtained from local pharmacy or Health Dept. Verbalized acceptance and understanding.  Flu Vaccine status: Up to date  Pneumococcal vaccine status: Up to date  Covid-19 vaccine status: Completed vaccines  Qualifies for Shingles Vaccine? Yes   Zostavax completed Yes   Shingrix Completed?: Yes  Screening Tests Health Maintenance  Topic Date Due   COVID-19 Vaccine (3 - Pfizer risk series) 01/04/2020   Zoster Vaccines- Shingrix (2 of 2) 11/16/2021   DTaP/Tdap/Td (3 - Td or Tdap) 09/23/2022   Hepatitis C Screening  06/22/2056 (Originally 04/24/1969)   MAMMOGRAM  08/28/2023   Medicare Annual Wellness (AWV)  09/30/2023   COLONOSCOPY (Pts  45-16yrs Insurance coverage will need to be confirmed)  01/01/2030   Pneumonia Vaccine 21+ Years old  Completed   INFLUENZA VACCINE  Completed   DEXA SCAN  Completed   HPV [place] Maintenance Due  Topic Date Due   COVID-19 Vaccine (3 - Pfizer risk series) 01/04/2020   Zoster Vaccines- Shingrix (2 of 2) 11/16/2021   DTaP/Tdap/Td (3 - Td or Tdap) 09/23/2022    Colorectal cancer screening: Type of screening: Colonoscopy. Completed 01/02/20. Repeat every 10 years  Mammogram status: Completed 08/27/21. Repeat every year  Bone Density status: Completed 10/31/15. Results reflect: Bone density results: NORMAL. Repeat every 2 years.   Additional Screening:  Hepatitis C Screening: does qualify;  Vision Screening: Recommended annual ophthalmology exams for early detection of glaucoma and other disorders of the eye. Is the patient up to date with their annual eye exam?  Yes  Who is the provider or what is the name of the office in which the patient attends annual eye exams? [facility]  If pt is not established with a provider, would they like to be referred to a provider to establish care? No .   Dental Screening: Recommended annual dental exams for proper oral hygiene  Community Resource Referral / Chronic Care Management: CRR required this visit?  No   CCM required this visit?  No      Plan:     I have personally reviewed and noted the following in the patient's chart:   Medical and social history Use of alcohol, tobacco or illicit drugs  Current medications and supplements including opioid prescriptions. Patient is not currently taking opioid prescriptions. Functional ability and status Nutritional status Physical activity Advanced directives List of other physicians Hospitalizations, surgeries, and ER visits in previous 12 months Vitals Screenings to include cognitive, depression, and falls Referrals and appointments  In  addition, I have reviewed and discussed with patient certain preventive protocols, quality metrics, and best practice recommendations. A written personalized care plan for preventive services as well as general preventive health recommendations were provided to patient.     Willette Brace, LPN   04/30/2118   Nurse Notes:  Pt declined 6  CIT stating she has done this with her neurologist , pt was very knowledgeable and able to reply with understanding

## 2022-10-10 ENCOUNTER — Ambulatory Visit (HOSPITAL_BASED_OUTPATIENT_CLINIC_OR_DEPARTMENT_OTHER): Payer: Medicare HMO | Admitting: Orthopaedic Surgery

## 2022-10-17 ENCOUNTER — Ambulatory Visit (HOSPITAL_BASED_OUTPATIENT_CLINIC_OR_DEPARTMENT_OTHER): Payer: Medicare HMO | Admitting: Orthopaedic Surgery

## 2022-10-17 DIAGNOSIS — S76011A Strain of muscle, fascia and tendon of right hip, initial encounter: Secondary | ICD-10-CM

## 2022-10-17 NOTE — Progress Notes (Signed)
Post Operative Evaluation    Procedure/Date of Surgery: right hip gluteus medius repair 05/19/22  Interval History:    Presents today for follow-up status post gluteus medius repair.  Overall she is having some days that are better than others.  She still is having persistent abduction pain and tenderness along the lateral aspect of the hip.  She states that today while cooking dinner she was in significant pain.  She is walking with a normal gait at this visit.   PMH/PSH/Family History/Social History/Meds/Allergies:    Past Medical History:  Diagnosis Date   Anemia    Arthritis    DJD, low back, thumb   Atrial fibrillation (HCC)    Xarelto anticoagulation. Flecainide antiarrythmic    Chicken pox    Chronic pain syndrome    On disability. History of bilateral hip pain, low back pain. Gabapentin 400 BID, percocet 1 tablet daily per prior provider.    Colon polyp    awaiting records   Depression    zoloft 100mg , remeron 30mg  per psychiatry. ambien 10mg  per psychiatry to help wtih sleep element.    Dysrhythmia    Dystonia    described as psychogenic dystonia. Pain and twisting from upper chest and up with triggers "wind, creamy food" on TID ativan per psychiatry previously.    Fibromyalgia    GERD (gastroesophageal reflux disease)    omeprazole OTC   Goiter    states multiple imaging tests, has had biopsies   Hyperlipidemia    lovastatin 20mg    Stroke (HCC)    TIA (left side of face and body decreased sensitivity than right face and side)   TIA (transient ischemic attack)    Vaginal atrophy    estrace vaginal cream   Past Surgical History:  Procedure Laterality Date   ATRIAL FIBRILLATION ABLATION     BIOPSY THYROID     fusion l4-l5  09/22/1998   GLUTEUS MINIMUS REPAIR Right 05/19/2022   Procedure: RIGHT HIP GLUTEUS MEDIUS TENDON REPAIR;  Surgeon: Vanetta Mulders, MD;  Location: [facility];  Service: Orthopedics;  Laterality: Right;    LAMINECTOMY     L4-L5   OTHER SURGICAL HISTORY     tennis elbow surgery   piriformis release  09/22/1997   right hip   TONSILLECTOMY AND ADENOIDECTOMY  age 72   VAGINAL HYSTERECTOMY  09/23/1991   Social History   Socioeconomic History   Marital status: Married    Spouse name: Not on file   Number of children: 3   Years of education: 13   Highest education level: Not on file  Occupational History   Occupation: retired  Tobacco Use   Smoking status: Never   Smokeless tobacco: Never  Vaping Use   Vaping Use: Never used  Substance and Sexual Activity   Alcohol use: No    Alcohol/week: 0.0 standard drinks of alcohol   Drug use: No   Sexual activity: Not Currently    Partners: Male  Other Topics Concern   Not on file  Social History Narrative   Family: Married. Husband works as Geophysicist/field seismologist for [employer]. 2 children from previous marriage 1 from current. Son [name] lives with them and has down's syndrome.       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 Moved to [facility] from [phone] and became  disabled in that time frame due to hip/back issues.       Hobbies: time with son   Right handed   One story home   Caffeine occasionally   Social Determinants of Health   Financial Resource Strain: Low Risk  (09/29/2022)   Overall Financial Resource Strain (CARDIA)    Difficulty of Paying Living Expenses: Not hard at all  Food Insecurity: No Food Insecurity (09/29/2022)   Hunger Vital Sign    Worried About Running Out of Food in the Last Year: Never true    Ran Out of Food in the Last Year: Never true  Transportation Needs: No Transportation Needs (09/29/2022)   PRAPARE - Administrator, Civil Service (Medical): No    Lack of Transportation (Non-Medical): No  Physical Activity: Insufficiently Active (09/29/2022)   Exercise Vital Sign    Days of Exercise per Week: 3 days    Minutes of Exercise per Session: 30 min  Stress: No Stress Concern Present  (09/29/2022)   [employer] of Occupational Health - Occupational Stress Questionnaire    Feeling of Stress : Not at all  Social Connections: Moderately Integrated (09/29/2022)   Social Connection and Isolation Panel [NHANES]    Frequency of Communication with Friends and Family: More than three times a week    Frequency of Social Gatherings with Friends and Family: More than three times a week    Attends Religious Services: More than 4 times per year    Active Member of [employer] or Organizations: No    Attends Engineer, structural: Never    Marital Status: Married   Family History  Problem Relation Age of Onset   Alcohol abuse Mother        possible she had stomach cancer as well but unsure   Hypertension Father    Alcohol abuse Father    Pancreatic cancer Father    Cancer Brother        spindle cell RLE   Diabetes Brother    Cancer Brother        tonsil cancer   Skin cancer Brother    Colon cancer Neg Hx    Colon polyps Neg Hx    Stomach cancer Neg Hx    Allergies  Allergen Reactions   Reglan [Metoclopramide] Other (See Comments)    Suicidal thoughts   Ciprofloxacin Diarrhea   Clindamycin/Lincomycin Diarrhea   Doxycycline Diarrhea and Nausea And Vomiting   Macrobid [Nitrofurantoin Monohyd Macro] Rash    Rash on lip   Sulfa Antibiotics Rash   Current Outpatient Medications  Medication Sig Dispense Refill   acetaminophen (TYLENOL) 500 MG tablet Take 1,000 mg by mouth every 6 (six) hours as needed for mild pain, moderate pain, fever or headache.     buPROPion (WELLBUTRIN XL) 150 MG 24 hr tablet Take 300 mg by mouth daily.     cephALEXin (KEFLEX) 250 MG capsule Take 250 mg by mouth daily.     Cholecalciferol (VITAMIN D) 125 MCG (5000 UT) CAPS Take 5,000 Units by mouth daily.     Coenzyme Q10 200 MG TABS Take 200 mg by mouth daily.     Cyanocobalamin (B-12 PO) Take 1 tablet by mouth daily.     Erenumab-aooe (AIMOVIG) 140 MG/ML SOAJ Inject 140 mg into the skin every  28 (twenty-eight) days. 1.12 mL 11   flecainide (TAMBOCOR) 100 MG tablet Take 1 tablet by mouth twice daily 180 tablet 0   FLUZONE HIGH-DOSE QUADRIVALENT 0.7 ML SUSY  Magnesium Oxide 420 MG TABS Take 420 mg by mouth daily.     melatonin 5 MG TABS Take 5 mg by mouth at bedtime.     Multiple Vitamin (MULTIVITAMIN WITH MINERALS) TABS tablet Take 1 tablet by mouth daily.     ondansetron (ZOFRAN ODT) 4 MG disintegrating tablet Take 1 tablet (4 mg total) by mouth every 8 (eight) hours as needed for nausea or vomiting. 20 tablet 0   pantoprazole (PROTONIX) 20 MG tablet Take 1 tablet (20 mg total) by mouth 2 (two) times daily before a meal. Please schedule an office visit for further refills. Thank you 60 tablet 1   pyridOXINE (VITAMIN B6) 100 MG tablet Take 100 mg by mouth daily.     rosuvastatin (CRESTOR) 20 MG tablet Take 1 tablet by mouth once daily 90 tablet 0   sertraline (ZOLOFT) 50 MG tablet Take 1 tablet (50 mg total) by mouth daily. (Patient taking differently: Take 50 mg by mouth in the morning and at bedtime.) 30 tablet 6   SUMAtriptan (IMITREX) 100 MG tablet TAKE 1 TABLET BY MOUTH AS NEEDED FOR UP TO 1 DOSE FOR MIGRAINE 10 tablet 0   traZODone (DESYREL) 50 MG tablet TAKE 1/2 TO 1 (ONE-HALF TO ONE) TABLET BY MOUTH AT BEDTIME AS NEEDED FOR SLEEP 30 tablet 0   XARELTO 20 MG TABS tablet TAKE 1 TABLET BY MOUTH ONCE DAILY WITH SUPPER 90 tablet 0   No current facility-administered medications for this visit.   No results found.  Review of Systems:   A ROS was performed including pertinent positives and negatives as documented in the HPI.   Musculoskeletal Exam:    There were no vitals taken for this visit.  Right hip is well-appearing.  No erythema or drainage.  Active flexion about the right hip is to 120 degrees while in the seated position.  Internal and external rotation is to 30 degrees.  Improved abduction strength about the hip which is somewhat equal to the contralateral side.   She is walking without any assistive devices normally without Trendelenburg gait Imaging:    none  I personally reviewed and interpreted the radiographs.   Assessment:   72 year old female who is 6 months status post right hip gluteus medius repair.  Although she is making progress unfortunately she still does have significant pain.  I did describe that overall given her persistent ability to diligently rehab, I do believe we have 2 different routes that we could pursue.  1 would be an MRI to see if there is any atrophy or persistent tearing about the hip abductor mechanism.  The second would be referral to Dr. [name] for discussion of shockwave of the right hip.  She would like to proceed with this initially start less invasive if possible.  Will plan to begin with a Dr. [name] referral for discussion of shock therapy in the right hip.  I will plan to see her back for MRI if she is not getting improvement.  Plan :    -Plan for referral to Dr. [name] for right hip shockwave therapy      I personally saw and evaluated the patient, and participated in the management and treatment plan.  Vanetta Mulders, MD Attending Physician, Orthopedic Surgery  This document was dictated using Dragon voice recognition software. A reasonable attempt at proof reading has been made to minimize errors.

## 2022-10-20 ENCOUNTER — Other Ambulatory Visit: Payer: Self-pay | Admitting: Physician Assistant

## 2022-10-23 ENCOUNTER — Encounter: Payer: Self-pay | Admitting: Sports Medicine

## 2022-10-23 ENCOUNTER — Ambulatory Visit: Payer: Medicare HMO | Admitting: Sports Medicine

## 2022-10-23 DIAGNOSIS — S76011S Strain of muscle, fascia and tendon of right hip, sequela: Secondary | ICD-10-CM | POA: Diagnosis not present

## 2022-10-23 DIAGNOSIS — M25551 Pain in right hip: Secondary | ICD-10-CM | POA: Diagnosis not present

## 2022-10-23 NOTE — Progress Notes (Signed)
Danielle Rocha - 72 y.o. female MRN [id number]  Date of birth: Nov 12, 1950  Office Visit Note: Visit Date: 10/23/2022 PCP: Marin Olp, MD Referred by: Vanetta Mulders, MD  Subjective: Chief Complaint  Patient presents with   Right Leg - Pain   HPI: Danielle Rocha is a very pleasant 72 y.o. female who presents today for right lateral hip pain.  She has had a chronic history of lateral hip pain.  She had a piriformis release back in 1998 or 1999.  She had a lumbar fusion to see if this would help relieve her pain.  Neither of these helped improve her pain.  She has had injections in the past as well. She is about 6 months s/p right hip gluteus medius repair by my partner, Dr. Sammuel Hines.  She continues with pain over the lateral hip, does note some weakness as well with associated pain.  At times will radiate down the lateral thigh and into the upper buttock.  Pertinent ROS were reviewed with the patient and found to be negative unless otherwise specified above in HPI.   Assessment & Plan: Visit Diagnoses:  1. Tear of right gluteus medius tendon, sequela   2. Lateral pain of right hip    Plan: Discussed with Danielle Rocha possible treatment options for her lateral hip pain.  She was sent to me by Dr. Sammuel Hines for a trial of extracorporeal shockwave therapy. We did discuss R/B/I, underwent trial today which she tolerated well. Given her history of migraine HA's, would avoid nitroglycerin patch protocol.  Ideally will need to strengthen her hips as well, but we will try shockwave and other treatments first to lessen her pain before doing this.  She will present next week for a repeat evaluation and trial. May use Tylenol/OTC meds for any post-procedural pain.  Follow-up: Return in about 1 week (around 10/30/2022) for for right hip GT shockwave trial (reg visit).   Meds & Orders: No orders of the defined types were placed in this encounter.  No orders of the defined types were placed in this  encounter.    Procedures: Procedure: ECSWT Indications:  Gluteus medius and greater trochanteric pain   Procedure Details Consent: Risks of procedure as well as the alternatives and risks of each were explained to the patient.  Verbal consent for procedure obtained. Time Out: Verified patient identification, verified procedure, site was marked, verified correct patient position. The area was cleaned with alcohol swab.     The right greater trochanteric region and gluteus medius tendon was targeted for Extracorporeal shockwave therapy.    Preset: Greater Trochanteric Bursitis Power Level: 100 - 110 Frequency: 12 Hz Impulse/cycles: 3000 Head size: Regular   Patient tolerated procedure well without immediate complications.         Clinical History: EXAM: MRI CERVICAL SPINE WITHOUT CONTRAST   TECHNIQUE: Multiplanar, multisequence MR imaging of the cervical spine was performed. No intravenous contrast was administered.   COMPARISON:  01/05/2022 brain MRI   FINDINGS: Alignment: Mild anterolisthesis at C2-3.   Vertebrae: No fracture, evidence of discitis, or bone lesion.   Cord: Normal signal and morphology.   Posterior Fossa, vertebral arteries, paraspinal tissues: Negative.   Disc levels:   C2-3: Slight anterolisthesis although no significant degenerative changes noted   C3-4: Uncovertebral ridging asymmetric to the left where there is moderate foraminal stenosis.   C4-5: Facet spurring and left uncovertebral ridging with mild left foraminal narrowing.   C5-6: Disc narrowing with bulging and uncovertebral ridging. Mild left  foraminal narrowing   C6-7: Degenerative facet spurring asymmetric to the left. Mild left uncovertebral ridging. Moderate left foraminal narrowing. Right foraminal root sleeve cyst.   C7-T1:Unremarkable.   IMPRESSION: 1. Cervical spine degeneration causing moderate foraminal stenosis on the left at C3-4 and C6-7. 2. No inflammation or  impingement seen at the skull base to explain radiation to the head.     Electronically Signed   By: Jorje Guild M.D.   On: 04/10/2022 17:05  She reports that she has never smoked. She has never used smokeless tobacco. No results for input(s): "HGBA1C", "LABURIC" in the last 8760 hours.  Objective:   Vital Signs: LMP  (LMP Unknown)   Physical Exam  Gen: Well-appearing, in no acute distress; non-toxic CV: Well-perfused. Warm.  Resp: Breathing unlabored on room air; no wheezing. Psych: Fluid speech in conversation; appropriate affect; normal thought process Neuro: Sensation intact throughout. No gross coordination deficits.   Ortho Exam - Right hip: + TTP over GT and insertional gluteus tendons. Mild TTP down middle of IT-band.  There are 2 well-healed scars overlying the lateral hip without signs of infection.  No significant warmth or redness.  There is no restriction with internal and external rotation about the hip.  Positive FABER test.  There is weakness with resisted abduction of the right compared to the left, although both are slightly weak.  Otherwise full strength.  Imaging: No results found.  Past Medical/Family/Surgical/Social History: Medications & Allergies reviewed per EMR, new medications updated. Patient Active Problem List   Diagnosis Date Noted   Tear of right gluteus medius tendon    Sore in nose 07/30/2021   Involuntary movements 02/14/2020   Depression with anxiety 02/14/2020   Aortic atherosclerosis ([place]) 02/07/2020   Delayed gastric emptying 02/07/2020   Dystonia    Restless legs 07/27/2018   Recurrent UTI 10/07/2016   Major depression in full remission ([place]) 09/02/2016   GERD (gastroesophageal reflux disease) 01/30/2016   History of SI/intentional drug overdose 11/22/2015   Atrial fibrillation (HCC)    Chronic pain syndrome    Hyperlipidemia    Past Medical History:  Diagnosis Date   Anemia    Arthritis    DJD, low back, thumb   Atrial  fibrillation (HCC)    Xarelto anticoagulation. Flecainide antiarrythmic    Chicken pox    Chronic pain syndrome    On disability. History of bilateral hip pain, low back pain. Gabapentin 400 BID, percocet 1 tablet daily per prior provider.    Colon polyp    awaiting records   Depression    zoloft 100mg , remeron 30mg  per psychiatry. ambien 10mg  per psychiatry to help wtih sleep element.    Dysrhythmia    Dystonia    described as psychogenic dystonia. Pain and twisting from upper chest and up with triggers "wind, creamy food" on TID ativan per psychiatry previously.    Fibromyalgia    GERD (gastroesophageal reflux disease)    omeprazole OTC   Goiter    states multiple imaging tests, has had biopsies   Hyperlipidemia    lovastatin 20mg    Stroke (HCC)    TIA (left side of face and body decreased sensitivity than right face and side)   TIA (transient ischemic attack)    Vaginal atrophy    estrace vaginal cream   Family History  Problem Relation Age of Onset   Alcohol abuse Mother        possible she had stomach cancer as well but unsure  Hypertension Father    Alcohol abuse Father    Pancreatic cancer Father    Cancer Brother        spindle cell RLE   Diabetes Brother    Cancer Brother        tonsil cancer   Skin cancer Brother    Colon cancer Neg Hx    Colon polyps Neg Hx    Stomach cancer Neg Hx    Past Surgical History:  Procedure Laterality Date   ATRIAL FIBRILLATION ABLATION     BIOPSY THYROID     fusion l4-l5  09/22/1998   GLUTEUS MINIMUS REPAIR Right 05/19/2022   Procedure: RIGHT HIP GLUTEUS MEDIUS TENDON REPAIR;  Surgeon: Vanetta Mulders, MD;  Location: [facility];  Service: Orthopedics;  Laterality: Right;   LAMINECTOMY     L4-L5   OTHER SURGICAL HISTORY     tennis elbow surgery   piriformis release  09/22/1997   right hip   TONSILLECTOMY AND ADENOIDECTOMY  age 49   VAGINAL HYSTERECTOMY  09/23/1991   Social History   Occupational History   Occupation:  retired  Tobacco Use   Smoking status: Never   Smokeless tobacco: Never  Vaping Use   Vaping Use: Never used  Substance and Sexual Activity   Alcohol use: No    Alcohol/week: 0.0 standard drinks of alcohol   Drug use: No   Sexual activity: Not Currently    Partners: Male

## 2022-10-23 NOTE — Progress Notes (Signed)
Years of pain in right hip Seen by Sammuel Hines who recommended shockwave therapy

## 2022-10-27 ENCOUNTER — Other Ambulatory Visit: Payer: Self-pay | Admitting: Family Medicine

## 2022-10-30 ENCOUNTER — Encounter: Payer: Self-pay | Admitting: Sports Medicine

## 2022-10-30 ENCOUNTER — Ambulatory Visit: Payer: Medicare HMO | Admitting: Sports Medicine

## 2022-10-30 DIAGNOSIS — M25551 Pain in right hip: Secondary | ICD-10-CM

## 2022-10-30 DIAGNOSIS — S76011S Strain of muscle, fascia and tendon of right hip, sequela: Secondary | ICD-10-CM | POA: Diagnosis not present

## 2022-10-30 MED ORDER — NITROGLYCERIN 0.2 MG/HR TD PT24: 0.2000 mg | MEDICATED_PATCH | Freq: Every day | TRANSDERMAL | 1 refills | Status: DC

## 2022-10-30 NOTE — Progress Notes (Signed)
Shravya Wickwire - 72 y.o. female MRN [id number]  Date of birth: 1951/08/06  Office Visit Note: Visit Date: 10/30/2022 PCP: Marin Olp, MD Referred by: Marin Olp, MD  Subjective: Chief Complaint  Patient presents with   Right Hip - Follow-up   HPI: Nikkole Placzek is a pleasant 72 y.o. female who presents today for follow-up of right lateral hip pain.  She is about 6 months s/p right hip gluteus medius repair by my partner, Dr. Sammuel Hines. Had first trial of ECSWT on 10/23/22. Feels like she is doing about the same.  She did not notice much of a difference, maybe only very temporary pain relief for a day or so.  Feels like her pain still continues.  Notices the pain within the hip but also very sensitive pain with even light touch or compression of her clothing or bed sheets over the hip. Still continues with HEP as able.  Pertinent ROS were reviewed with the patient and found to be negative unless otherwise specified above in HPI.   Assessment & Plan: Visit Diagnoses:  1. Tear of right gluteus medius tendon, sequela   2. Lateral pain of right hip    Plan: Discussed with Sola our plan for the right hip and her previous scan.  She did not notice much relief from the first shockwave therapy, we will do a second additional trial.  If she does not find much benefit from this, I think we will discontinue this treatment modality and consider other options.  If she does get improvement, would recommend once weekly treatments.  She will continue her home hip strengthening stabilization protocol exercises.  We will do a trial of nitroglycerin patch protocol to help with blood flow and healing to the previously repaired area.  I did discuss that I usually do not try this with patients with a history of migraine headaches, although she is willing to do this given the degree of her pain.  She does know this can exacerbate her HA's.  Other treatment considerations could be trialing  amitriptyline at bedtime for her hypersensitivity of the skin over that region.  She will follow-up in 1 month.  Follow-up: Return in about 4 weeks (around 11/27/2022) for with Mitsuru Dault for lateral hip.   Meds & Orders:  Meds ordered this encounter  Medications   nitroGLYCERIN (NITRODUR - DOSED IN MG/24 HR) 0.2 mg/hr patch    Sig: Place 1 patch (0.2 mg total) onto the skin daily. Cut the patch into 1/4ths --> place 1/4 patch over affected area, change every 24 hours    Dispense:  30 patch    Refill:  1   No orders of the defined types were placed in this encounter.    Procedures: Procedure: ECSWT Indications:  Gluteus medius and greater trochanteric pain   Procedure Details Consent: Risks of procedure as well as the alternatives and risks of each were explained to the patient.  Verbal consent for procedure obtained. Time Out: Verified patient identification, verified procedure, site was marked, verified correct patient position. The area was cleaned with alcohol swab.     The right greater trochanteric region and gluteus medius tendon was targeted for Extracorporeal shockwave therapy.    Preset: Greater Trochanteric Bursitis Power Level: 100 - 110 Frequency: 12 Hz Impulse/cycles: 3000 Head size: Regular   Patient tolerated procedure well without immediate complications.       Clinical History: EXAM: MRI CERVICAL SPINE WITHOUT CONTRAST   TECHNIQUE: Multiplanar, multisequence MR imaging of  the cervical spine was performed. No intravenous contrast was administered.   COMPARISON:  01/05/2022 brain MRI   FINDINGS: Alignment: Mild anterolisthesis at C2-3.   Vertebrae: No fracture, evidence of discitis, or bone lesion.   Cord: Normal signal and morphology.   Posterior Fossa, vertebral arteries, paraspinal tissues: Negative.   Disc levels:   C2-3: Slight anterolisthesis although no significant degenerative changes noted   C3-4: Uncovertebral ridging asymmetric to the  left where there is moderate foraminal stenosis.   C4-5: Facet spurring and left uncovertebral ridging with mild left foraminal narrowing.   C5-6: Disc narrowing with bulging and uncovertebral ridging. Mild left foraminal narrowing   C6-7: Degenerative facet spurring asymmetric to the left. Mild left uncovertebral ridging. Moderate left foraminal narrowing. Right foraminal root sleeve cyst.   C7-T1:Unremarkable.   IMPRESSION: 1. Cervical spine degeneration causing moderate foraminal stenosis on the left at C3-4 and C6-7. 2. No inflammation or impingement seen at the skull base to explain radiation to the head.     Electronically Signed   By: Jorje Guild M.D.   On: 04/10/2022 17:05  She reports that she has never smoked. She has never used smokeless tobacco. No results for input(s): "HGBA1C", "LABURIC" in the last 8760 hours.  Objective:   Vital Signs: LMP  (LMP Unknown)   Physical Exam  Gen: Well-appearing, in no acute distress; non-toxic CV: Well-perfused. Warm.  Resp: Breathing unlabored on room air; no wheezing. Psych: Fluid speech in conversation; appropriate affect; normal thought process Neuro: Sensation intact throughout. No gross coordination deficits.   Ortho Exam - Right hip: + Exquisite TTP over the greater trochanteric and insertional gluteal tendons.  There are some mild TTP palpating down the IT band.  There is also pain to even light touch around this area.  There are 2 well-healed scars overlying the lateral hip without signs of infection.  No restriction to internal or external rotation about the hip.  There is mild weakness with pain with resisted hip abduction on the right. NVI.  Imaging:  MRI right hip 12/17/21: RIGHT HIP:   Articular cartilage and labrum   Articular cartilage: Mild-to-moderate thinning of the anterior superior right femoral head and acetabular cartilage. Mild right femoral head-neck junction peripheral degenerative  osteophytes. Normal morphology of the right femoral head-neck junction without CAM-type bump abnormality.   Labrum: Lack of intra-articular fluid limits evaluation of the right acetabular labrum. There is intermediate T2 signal and peripheral degenerative irregularity of the anterior superior right acetabular labrum (series 14, image 11 and series 13, image 14).   Joint or bursal effusion   Joint effusion:  No right hip joint effusion.   Bursae: Trace fluid within the right trochanteric bursa.   ---   LEFT HIP:   Articular cartilage and labrum   Articular cartilage: There is mild-to-moderate thinning of the anterior superior left femoral head and acetabular cartilage. Mild left femoral head-neck junction peripheral degenerative osteophytes. Normal morphology of the femoral head-neck junction is otherwise seen without CAM-type bump deformity.   Labrum: Lack of intra-articular fluid limits evaluation of the left acetabular labrum. There is minimal degenerative peripheral blunting of the anterior superior left acetabular labrum.   Joint or bursal effusion   Joint effusion:  No left hip joint effusion.   Bursae: Mild fluid within the left trochanteric bursa.   Muscles and tendons   Muscles and tendons: The origins of the bilateral rectus femoris tendons are intact. Mild increased T2 fluid bright signal in between the semimembranosus  and conjoined origin of the left semitendinosis and biceps femoris tendon origins at the left ischial tuberosity with mild surrounding edema likely mild tendinosis. The right common hamstring tendon origin is intact.   Mild intermediate T2 signal within and mild fluid around the left gluteus minimus tendon insertion, likely mild tendinosis and tenosynovitis respectively. Minimal right gluteus minimus insertional tenosynovitis. The bilateral gluteus medius tendon insertions are intact. The bilateral iliopsoas tendon insertions are intact.    Other findings   Miscellaneous:   None.   IMPRESSION:: IMPRESSION: 1. Mild-to-moderate bilateral anterior superior femoral head and acetabular cartilage thinning. 2. Mild left-greater-than-right trochanteric bursitis. 3. Mild left common hamstring origin tendinosis. 4. Mild left and minimal right gluteus minimus insertional tenosynovitis with mild left gluteus minimus tendinosis.     Electronically Signed   By: Neita Garnet M.D.   On: 12/18/2021 13:16  Past Medical/Family/Surgical/Social History: Medications & Allergies reviewed per EMR, new medications updated. Patient Active Problem List   Diagnosis Date Noted   Tear of right gluteus medius tendon    Sore in nose 07/30/2021   Involuntary movements 02/14/2020   Depression with anxiety 02/14/2020   Aortic atherosclerosis (HCC) 02/07/2020   Delayed gastric emptying 02/07/2020   Dystonia    Restless legs 07/27/2018   Recurrent UTI 10/07/2016   Major depression in full remission (HCC) 09/02/2016   GERD (gastroesophageal reflux disease) 01/30/2016   History of SI/intentional drug overdose 11/22/2015   Atrial fibrillation (HCC)    Chronic pain syndrome    Hyperlipidemia    Past Medical History:  Diagnosis Date   Anemia    Arthritis    DJD, low back, thumb   Atrial fibrillation (HCC)    Xarelto anticoagulation. Flecainide antiarrythmic    Chicken pox    Chronic pain syndrome    On disability. History of bilateral hip pain, low back pain. Gabapentin 400 BID, percocet 1 tablet daily per prior provider.    Colon polyp    awaiting records   Depression    zoloft 100mg , remeron 30mg  per psychiatry. ambien 10mg  per psychiatry to help wtih sleep element.    Dysrhythmia    Dystonia    described as psychogenic dystonia. Pain and twisting from upper chest and up with triggers "wind, creamy food" on TID ativan per psychiatry previously.    Fibromyalgia    GERD (gastroesophageal reflux disease)    omeprazole OTC   Goiter     states multiple imaging tests, has had biopsies   Hyperlipidemia    lovastatin 20mg    Stroke (HCC)    TIA (left side of face and body decreased sensitivity than right face and side)   TIA (transient ischemic attack)    Vaginal atrophy    estrace vaginal cream   Family History  Problem Relation Age of Onset   Alcohol abuse Mother        possible she had stomach cancer as well but unsure   Hypertension Father    Alcohol abuse Father    Pancreatic cancer Father    Cancer Brother        spindle cell RLE   Diabetes Brother    Cancer Brother        tonsil cancer   Skin cancer Brother    Colon cancer Neg Hx    Colon polyps Neg Hx    Stomach cancer Neg Hx    Past Surgical History:  Procedure Laterality Date   ATRIAL FIBRILLATION ABLATION     BIOPSY THYROID  fusion l4-l5  09/22/1998   GLUTEUS MINIMUS REPAIR Right 05/19/2022   Procedure: RIGHT HIP GLUTEUS MEDIUS TENDON REPAIR;  Surgeon: Vanetta Mulders, MD;  Location: [facility];  Service: Orthopedics;  Laterality: Right;   LAMINECTOMY     L4-L5   OTHER SURGICAL HISTORY     tennis elbow surgery   piriformis release  09/22/1997   right hip   TONSILLECTOMY AND ADENOIDECTOMY  age 31   VAGINAL HYSTERECTOMY  09/23/1991   Social History   Occupational History   Occupation: retired  Tobacco Use   Smoking status: Never   Smokeless tobacco: Never  Vaping Use   Vaping Use: Never used  Substance and Sexual Activity   Alcohol use: No    Alcohol/week: 0.0 standard drinks of alcohol   Drug use: No   Sexual activity: Not Currently    Partners: Male   I spent 33 minutes in the care of the patient today including face-to-face time, preparation to see the patient, as well as review of pertinent orthopedic notes, MRI review of hip, discussion on ECSWT and indications and SE-profile for nitroglycerin patch protocol, discussion on home exercise plan, and other possible treatment options for the above diagnoses.   Elba Barman, DO Primary  Care Sports Medicine Physician  [place]  This note was dictated using Dragon naturally speaking software and may contain errors in syntax, spelling, or content which have not been identified prior to signing this note.

## 2022-10-30 NOTE — Patient Instructions (Signed)
Nitroglycerin Protocol  Apply 1/4 nitroglycerin patch to affected area daily. Change position of patch within the affected area every 24 hours. You may experience a headache during the first 1-2 weeks of using the patch, these should subside. If you experience headaches after beginning nitroglycerin patch treatment, you may take your preferred over the counter pain reliever. Another side effect of the nitroglycerin patch is skin irritation or rash related to patch adhesive. Please notify our office if you develop more severe headaches or rash, and stop the patch. Tendon healing with nitroglycerin patch may require 12 to 24 weeks depending on the extent of injury. Men should not use if taking Viagra, Cialis, or Levitra.  Can make HA symptoms worse if have migraines or rosacea --> it could worsen. **We will try this despite migraine headaches **. Stop the patch if bothersome

## 2022-11-03 ENCOUNTER — Ambulatory Visit (INDEPENDENT_AMBULATORY_CARE_PROVIDER_SITE_OTHER): Payer: Medicare HMO | Admitting: Family Medicine

## 2022-11-03 VITALS — BP 122/70 | HR 61 | Temp 97.5°F | Ht 68.0 in | Wt 150.0 lb

## 2022-11-03 DIAGNOSIS — J069 Acute upper respiratory infection, unspecified: Secondary | ICD-10-CM | POA: Insufficient documentation

## 2022-11-03 LAB — POCT INFLUENZA A/B
Influenza A, POC: NEGATIVE
Influenza B, POC: NEGATIVE

## 2022-11-03 LAB — POC COVID19 BINAXNOW: SARS Coronavirus 2 Ag: NEGATIVE

## 2022-11-03 NOTE — Assessment & Plan Note (Signed)
Discussed home care for viral illness, including rest, pushing fluids, and OTC medications as needed for symptom relief. Recommend hot tea with honey for sore throat symptoms. Follow-up if needed for worsening or persistent symptoms.

## 2022-11-03 NOTE — Progress Notes (Signed)
[facility] [address] [place] [address] Dept: [phone] Dept Fax: [phone]  Office Visit  Subjective:    Patient ID: Danielle Rocha, female    DOB: August 16, 1951, 72 y.o..   MRN: [id number]  Chief Complaint  Patient presents with   URI   History of Present Illness:  Patient is in today complaining of a 3-day history of cough, body aches, headache, fatigue, and SOB. She notes that walking across her home has felt more like walking around the block. No one else at home has been sick. She is using various OTC meds (Robitussin, Nyquil, Delsym) and home remedies. She does not smoke. She has a history of well-controlled a. fib.   Past Medical History: Patient Active Problem List   Diagnosis Date Noted   Tear of right gluteus medius tendon    Sore in nose 07/30/2021   Involuntary movements 02/14/2020   Depression with anxiety 02/14/2020   Aortic atherosclerosis ([facility]) 02/07/2020   Delayed gastric emptying 02/07/2020   Dystonia    Restless legs 07/27/2018   Recurrent UTI 10/07/2016   Major depression in full remission ([facility]) 09/02/2016   GERD (gastroesophageal reflux disease) 01/30/2016   History of SI/intentional drug overdose 11/22/2015   Atrial fibrillation (HCC)    Chronic pain syndrome    Hyperlipidemia    Past Surgical History:  Procedure Laterality Date   ATRIAL FIBRILLATION ABLATION     BIOPSY THYROID     fusion l4-l5  09/22/1998   GLUTEUS MINIMUS REPAIR Right 05/19/2022   Procedure: RIGHT HIP GLUTEUS MEDIUS TENDON REPAIR;  Surgeon: Vanetta Mulders, MD;  Location: [facility];  Service: Orthopedics;  Laterality: Right;   LAMINECTOMY     L4-L5   OTHER SURGICAL HISTORY     tennis elbow surgery   piriformis release  09/22/1997   right hip   TONSILLECTOMY AND ADENOIDECTOMY  age 55   VAGINAL HYSTERECTOMY  09/23/1991   Family History  Problem Relation Age of Onset   Alcohol abuse Mother        possible she had  stomach cancer as well but unsure   Hypertension Father    Alcohol abuse Father    Pancreatic cancer Father    Cancer Brother        spindle cell RLE   Diabetes Brother    Cancer Brother        tonsil cancer   Skin cancer Brother    Colon cancer Neg Hx    Colon polyps Neg Hx    Stomach cancer Neg Hx    Outpatient Medications Prior to Visit  Medication Sig Dispense Refill   acetaminophen (TYLENOL) 500 MG tablet Take 1,000 mg by mouth every 6 (six) hours as needed for mild pain, moderate pain, fever or headache.     buPROPion (WELLBUTRIN XL) 150 MG 24 hr tablet Take 300 mg by mouth daily.     cephALEXin (KEFLEX) 250 MG capsule Take 250 mg by mouth daily.     Cholecalciferol (VITAMIN D) 125 MCG (5000 UT) CAPS Take 5,000 Units by mouth daily.     Coenzyme Q10 200 MG TABS Take 200 mg by mouth daily.     Cyanocobalamin (B-12 PO) Take 1 tablet by mouth daily.     Erenumab-aooe (AIMOVIG) 140 MG/ML SOAJ Inject 140 mg into the skin every 28 (twenty-eight) days. 1.12 mL 11   flecainide (TAMBOCOR) 100 MG tablet Take 1 tablet by mouth twice daily 180 tablet 0   Magnesium  Oxide 420 MG TABS Take 420 mg by mouth daily.     melatonin 5 MG TABS Take 5 mg by mouth at bedtime.     Multiple Vitamin (MULTIVITAMIN WITH MINERALS) TABS tablet Take 1 tablet by mouth daily.     nitroGLYCERIN (NITRODUR - DOSED IN MG/24 HR) 0.2 mg/hr patch Place 1 patch (0.2 mg total) onto the skin daily. Cut the patch into 1/4ths --> place 1/4 patch over affected area, change every 24 hours 30 patch 1   ondansetron (ZOFRAN ODT) 4 MG disintegrating tablet Take 1 tablet (4 mg total) by mouth every 8 (eight) hours as needed for nausea or vomiting. 20 tablet 0   pantoprazole (PROTONIX) 20 MG tablet Take 1 tablet (20 mg total) by mouth 2 (two) times daily before a meal. Please schedule an office visit for further refills. Thank you 60 tablet 1   pyridOXINE (VITAMIN B6) 100 MG tablet Take 100 mg by mouth daily.     rosuvastatin  (CRESTOR) 20 MG tablet Take 1 tablet by mouth once daily 90 tablet 0   sertraline (ZOLOFT) 50 MG tablet Take 1 tablet (50 mg total) by mouth daily. (Patient taking differently: Take 50 mg by mouth in the morning and at bedtime.) 30 tablet 6   traZODone (DESYREL) 50 MG tablet TAKE 1/2 TO 1 (ONE-HALF TO ONE) TABLET BY MOUTH AT BEDTIME AS NEEDED FOR SLEEP 30 tablet 0   XARELTO 20 MG TABS tablet TAKE 1 TABLET BY MOUTH ONCE DAILY WITH SUPPER 90 tablet 0   SUMAtriptan (IMITREX) 100 MG tablet TAKE 1 TABLET BY MOUTH AS NEEDED FOR UP TO 1 DOSE FOR MIGRAINE 10 tablet 0   FLUZONE HIGH-DOSE QUADRIVALENT 0.7 ML SUSY      No facility-administered medications prior to visit.   Allergies  Allergen Reactions   Reglan [Metoclopramide] Other (See Comments)    Suicidal thoughts   Ciprofloxacin Diarrhea   Clindamycin/Lincomycin Diarrhea   Doxycycline Diarrhea and Nausea And Vomiting   Macrobid [Nitrofurantoin Monohyd Macro] Rash    Rash on lip   Sulfa Antibiotics Rash     Objective:   Today's Vitals   11/03/22 1606  BP: 122/70  Pulse: 61  Temp: (!) 97.5 F (36.4 C)  TempSrc: Temporal  SpO2: 96%  Weight: 150 lb (68 kg)  Height: 5' 8"$  (1.727 m)   Body mass index is 22.81 kg/m.   General: Well developed, well nourished. No acute distress. HEENT: Normocephalic, non-traumatic. PERRL, EOMI. Conjunctiva clear. External ears normal. EAC and TMs   normal bilaterally. Nose clear without congestion or rhinorrhea. Mucous membranes moist. Oropharynx clear.   Good dentition. Neck: Supple. No lymphadenopathy. No thyromegaly. Lungs: Clear to auscultation bilaterally. No wheezing, rales or rhonchi. CV: RRR without murmurs or rubs. Pulses 2+ bilaterally. Psych: Alert and oriented. Normal mood and affect.  Health Maintenance Due  Topic Date Due   Zoster Vaccines- Shingrix (2 of 2) 11/16/2021   DTaP/Tdap/Td (3 - Td or Tdap) 09/23/2022   Lab Results POCT Covid: Neg. POCT Influenza A& B: Neg.     Assessment & Plan:   Problem List Items Addressed This Visit       Respiratory   Viral URI with cough - Primary    Discussed home care for viral illness, including rest, pushing fluids, and OTC medications as needed for symptom relief. Recommend hot tea with honey for sore throat symptoms. Follow-up if needed for worsening or persistent symptoms.       Relevant Orders  POC COVID-19 (Completed)   POCT Influenza A/B (Completed)   Return if symptoms worsen or fail to improve.   Haydee Salter, MD

## 2022-11-06 ENCOUNTER — Telehealth: Payer: Self-pay | Admitting: Family Medicine

## 2022-11-07 NOTE — Telephone Encounter (Signed)
Error

## 2022-11-11 ENCOUNTER — Other Ambulatory Visit: Payer: Self-pay | Admitting: Family Medicine

## 2022-11-12 DIAGNOSIS — R69 Illness, unspecified: Secondary | ICD-10-CM | POA: Diagnosis not present

## 2022-11-14 ENCOUNTER — Other Ambulatory Visit: Payer: Self-pay | Admitting: Physician Assistant

## 2022-11-14 ENCOUNTER — Encounter: Payer: Self-pay | Admitting: Family Medicine

## 2022-11-17 ENCOUNTER — Ambulatory Visit: Payer: Medicare HMO | Admitting: Sports Medicine

## 2022-11-17 ENCOUNTER — Encounter: Payer: Self-pay | Admitting: Sports Medicine

## 2022-11-17 ENCOUNTER — Other Ambulatory Visit: Payer: Self-pay

## 2022-11-17 DIAGNOSIS — M25551 Pain in right hip: Secondary | ICD-10-CM

## 2022-11-17 DIAGNOSIS — S76011S Strain of muscle, fascia and tendon of right hip, sequela: Secondary | ICD-10-CM

## 2022-11-17 MED ORDER — PANTOPRAZOLE SODIUM 20 MG PO TBEC: 20.0000 mg | DELAYED_RELEASE_TABLET | Freq: Two times a day (BID) | ORAL | 1 refills | Status: DC

## 2022-11-17 NOTE — Progress Notes (Signed)
Not much better; does not think the patches are helping much  Second shockwave helped initially, but pain returned

## 2022-11-17 NOTE — Progress Notes (Signed)
Danielle Rocha - 72 y.o. female MRN [id number]  Date of birth: 02-13-1951  Office Visit Note: Visit Date: 11/17/2022 PCP: Marin Olp, MD Referred by: Marin Olp, MD  Subjective: Chief Complaint  Patient presents with   Right Hip - Pain   HPI: Danielle Rocha is a pleasant 72 y.o. female who presents today for follow-up of lateral hip pain.  She has had a chronic history of lateral hip pain.  She had a piriformis release back in 1998 or 1999.  She had a lumbar fusion to see if this would help relieve her pain.  Neither of these helped improve her pain.  She has had injections in the past as well. She is about 7 months s/p right hip gluteus medius repair by my partner, Dr. Sammuel Hines.  She continues with the nitroglycerin patch, changing once daily. The second shockwave treatment did help a decent amount for 2 days or so, but then her pain returned.  Pain continues over the lateral hip and can radiate into the buttock as well.  No numbness or tingling, but has sensitivity with light touch over the region.  In the past, she has been on gabapentin 2400 mg daily.  She is now off this and has not noticed any difference in her pain.  Pertinent ROS were reviewed with the patient and found to be negative unless otherwise specified above in HPI.   Assessment & Plan: Visit Diagnoses:  1. Tear of right gluteus medius tendon, sequela   2. Lateral pain of right hip    Plan: Discussed treatment options for [name] and her chronic lateral hip pain.  She did get some relief from the second extracorporeal shockwave treatment.  She would like to continue these consistently 1 week apart for 3-4 sessions to see if this accumulates pain relief and analgesic control.  She will continue her nitroglycerin patch.  Given the skin hypersensitivity, I did discuss a trial of Elavil, but would want her to transition from her Zoloft.  She will discuss this with her psychiatrist and her PCP before doing  this, but we can consider this at a future visit.  She will follow-up in 1 week. Continue nitroglycerin patch protocol (0.'2mg'$ ) every 24 hours and home rehab exercises.  Follow-up: Return in about 1 week (around 11/24/2022) for for lateral hip pain.   Meds & Orders: No orders of the defined types were placed in this encounter.  No orders of the defined types were placed in this encounter.    Procedures: Procedure: ECSWT Indications:  Gluteus medius and greater trochanteric pain   Procedure Details Consent: Risks of procedure as well as the alternatives and risks of each were explained to the patient.  Verbal consent for procedure obtained. Time Out: Verified patient identification, verified procedure, site was marked, verified correct patient position. The area was cleaned with alcohol swab.     The right greater trochanteric region and gluteus medius tendon was targeted for Extracorporeal shockwave therapy.    Preset: Greater Trochanteric Bursitis Power Level: 110 mJ Frequency: 12 Hz Impulse/cycles: 2500 Head size: Regular   Patient tolerated procedure well without immediate complications.      Clinical History:   She reports that she has never smoked. She has never used smokeless tobacco. No results for input(s): "HGBA1C", "LABURIC" in the last 8760 hours.  Objective:   Vital Signs: LMP  (LMP Unknown)   Physical Exam  Gen: Well-appearing, in no acute distress; non-toxic CV: Well-perfused. Warm.  Resp: Breathing  unlabored on room air; no wheezing. Psych: Fluid speech in conversation; appropriate affect; normal thought process Neuro: Sensation intact throughout. No gross coordination deficits.   Ortho Exam - Right hip:  + TTP over the greater trochanteric and insertional gluteal tendons.  There is some mild TTP palpating down the IT band.  There is also pain to even light touch around this area, hypersensitvitiy to skin.  There are 2 well-healed scars overlying the lateral hip  without signs of infection.  No restriction to internal or external rotation about the hip.  There is mild weakness with pain with resisted hip abduction on the right. NVI.   Imaging: No results found.  Past Medical/Family/Surgical/Social History: Medications & Allergies reviewed per EMR, new medications updated. Patient Active Problem List   Diagnosis Date Noted   Viral URI with cough 11/03/2022   Tear of right gluteus medius tendon    Sore in nose 07/30/2021   Involuntary movements 02/14/2020   Depression with anxiety 02/14/2020   Aortic atherosclerosis ([place]) 02/07/2020   Delayed gastric emptying 02/07/2020   Dystonia    Restless legs 07/27/2018   Recurrent UTI 10/07/2016   Major depression in full remission ([place]) 09/02/2016   GERD (gastroesophageal reflux disease) 01/30/2016   History of SI/intentional drug overdose 11/22/2015   Atrial fibrillation (HCC)    Chronic pain syndrome    Hyperlipidemia    Past Medical History:  Diagnosis Date   Anemia    Arthritis    DJD, low back, thumb   Atrial fibrillation (HCC)    Xarelto anticoagulation. Flecainide antiarrythmic    Chicken pox    Chronic pain syndrome    On disability. History of bilateral hip pain, low back pain. Gabapentin 400 BID, percocet 1 tablet daily per prior provider.    Colon polyp    awaiting records   Depression    zoloft '100mg'$ , remeron '30mg'$  per psychiatry. ambien '10mg'$  per psychiatry to help wtih sleep element.    Dysrhythmia    Dystonia    described as psychogenic dystonia. Pain and twisting from upper chest and up with triggers "wind, creamy food" on TID ativan per psychiatry previously.    Fibromyalgia    GERD (gastroesophageal reflux disease)    omeprazole OTC   Goiter    states multiple imaging tests, has had biopsies   Hyperlipidemia    lovastatin '20mg'$    Stroke (HCC)    TIA (left side of face and body decreased sensitivity than right face and side)   TIA (transient ischemic attack)    Vaginal  atrophy    estrace vaginal cream   Family History  Problem Relation Age of Onset   Alcohol abuse Mother        possible she had stomach cancer as well but unsure   Hypertension Father    Alcohol abuse Father    Pancreatic cancer Father    Cancer Brother        spindle cell RLE   Diabetes Brother    Cancer Brother        tonsil cancer   Skin cancer Brother    Colon cancer Neg Hx    Colon polyps Neg Hx    Stomach cancer Neg Hx    Past Surgical History:  Procedure Laterality Date   ATRIAL FIBRILLATION ABLATION     BIOPSY THYROID     fusion l4-l5  09/22/1998   GLUTEUS MINIMUS REPAIR Right 05/19/2022   Procedure: RIGHT HIP GLUTEUS MEDIUS TENDON REPAIR;  Surgeon: Vanetta Mulders,  MD;  Location: [facility];  Service: Orthopedics;  Laterality: Right;   LAMINECTOMY     L4-L5   OTHER SURGICAL HISTORY     tennis elbow surgery   piriformis release  09/22/1997   right hip   TONSILLECTOMY AND ADENOIDECTOMY  age 89   VAGINAL HYSTERECTOMY  09/23/1991   Social History   Occupational History   Occupation: retired  Tobacco Use   Smoking status: Never   Smokeless tobacco: Never  Vaping Use   Vaping Use: Never used  Substance and Sexual Activity   Alcohol use: No    Alcohol/week: 0.0 standard drinks of alcohol   Drug use: No   Sexual activity: Not Currently    Partners: Male

## 2022-11-18 DIAGNOSIS — F3341 Major depressive disorder, recurrent, in partial remission: Secondary | ICD-10-CM | POA: Diagnosis not present

## 2022-11-18 DIAGNOSIS — R69 Illness, unspecified: Secondary | ICD-10-CM | POA: Diagnosis not present

## 2022-11-20 DIAGNOSIS — K219 Gastro-esophageal reflux disease without esophagitis: Secondary | ICD-10-CM | POA: Diagnosis not present

## 2022-11-20 DIAGNOSIS — M1990 Unspecified osteoarthritis, unspecified site: Secondary | ICD-10-CM | POA: Diagnosis not present

## 2022-11-20 DIAGNOSIS — R69 Illness, unspecified: Secondary | ICD-10-CM | POA: Diagnosis not present

## 2022-11-20 DIAGNOSIS — I951 Orthostatic hypotension: Secondary | ICD-10-CM | POA: Diagnosis not present

## 2022-11-20 DIAGNOSIS — K5900 Constipation, unspecified: Secondary | ICD-10-CM | POA: Diagnosis not present

## 2022-11-20 DIAGNOSIS — I10 Essential (primary) hypertension: Secondary | ICD-10-CM | POA: Diagnosis not present

## 2022-11-20 DIAGNOSIS — I471 Supraventricular tachycardia, unspecified: Secondary | ICD-10-CM | POA: Diagnosis not present

## 2022-11-20 DIAGNOSIS — E785 Hyperlipidemia, unspecified: Secondary | ICD-10-CM | POA: Diagnosis not present

## 2022-11-20 DIAGNOSIS — I252 Old myocardial infarction: Secondary | ICD-10-CM | POA: Diagnosis not present

## 2022-11-20 DIAGNOSIS — N3941 Urge incontinence: Secondary | ICD-10-CM | POA: Diagnosis not present

## 2022-11-20 DIAGNOSIS — G4700 Insomnia, unspecified: Secondary | ICD-10-CM | POA: Diagnosis not present

## 2022-11-20 DIAGNOSIS — R269 Unspecified abnormalities of gait and mobility: Secondary | ICD-10-CM | POA: Diagnosis not present

## 2022-11-21 ENCOUNTER — Encounter: Payer: Self-pay | Admitting: Family Medicine

## 2022-11-21 ENCOUNTER — Ambulatory Visit (INDEPENDENT_AMBULATORY_CARE_PROVIDER_SITE_OTHER): Payer: Medicare HMO | Admitting: Family Medicine

## 2022-11-21 VITALS — BP 110/64 | HR 64 | Temp 98.1°F | Ht 68.0 in | Wt 148.2 lb

## 2022-11-21 DIAGNOSIS — I700 Atherosclerosis of aorta: Secondary | ICD-10-CM | POA: Diagnosis not present

## 2022-11-21 DIAGNOSIS — E785 Hyperlipidemia, unspecified: Secondary | ICD-10-CM

## 2022-11-21 DIAGNOSIS — R053 Chronic cough: Secondary | ICD-10-CM | POA: Diagnosis not present

## 2022-11-21 DIAGNOSIS — Z780 Asymptomatic menopausal state: Secondary | ICD-10-CM | POA: Diagnosis not present

## 2022-11-21 DIAGNOSIS — I4891 Unspecified atrial fibrillation: Secondary | ICD-10-CM | POA: Diagnosis not present

## 2022-11-21 DIAGNOSIS — F325 Major depressive disorder, single episode, in full remission: Secondary | ICD-10-CM | POA: Diagnosis not present

## 2022-11-21 DIAGNOSIS — R69 Illness, unspecified: Secondary | ICD-10-CM | POA: Diagnosis not present

## 2022-11-21 DIAGNOSIS — Z1283 Encounter for screening for malignant neoplasm of skin: Secondary | ICD-10-CM

## 2022-11-21 MED ORDER — PANTOPRAZOLE SODIUM 20 MG PO TBEC: 20.0000 mg | DELAYED_RELEASE_TABLET | Freq: Two times a day (BID) | ORAL | 3 refills | Status: DC

## 2022-11-21 MED ORDER — FLUTICASONE PROPIONATE 50 MCG/ACT NA SUSP: 2.0000 | Freq: Every day | NASAL | 3 refills | Status: DC

## 2022-11-21 NOTE — Progress Notes (Signed)
Phone [phone] In person visit   Subjective:   Danielle Rocha is a 72 y.o. year old very pleasant female patient who presents for/with See problem oriented charting Chief Complaint  Patient presents with   Medication Refill   Gastroesophageal Reflux    Pt is needing protonix refilled.   Past Medical History-  Patient Active Problem List   Diagnosis Date Noted   Dystonia     Priority: High   Major depression in full remission ([place]) 09/02/2016    Priority: High   History of SI/intentional drug overdose 11/22/2015    Priority: High   Atrial fibrillation ([place])     Priority: High   Chronic pain syndrome     Priority: High   Aortic atherosclerosis ([place]) 02/07/2020    Priority: Medium    Delayed gastric emptying 02/07/2020    Priority: Medium    Restless legs 07/27/2018    Priority: Medium    Recurrent UTI 10/07/2016    Priority: Medium    Hyperlipidemia     Priority: Medium    GERD (gastroesophageal reflux disease) 01/30/2016    Priority: Low   Tear of right gluteus medius tendon    Sore in nose 07/30/2021   Involuntary movements 02/14/2020    Medications- reviewed and updated Current Outpatient Medications  Medication Sig Dispense Refill   acetaminophen (TYLENOL) 500 MG tablet Take 1,000 mg by mouth every 6 (six) hours as needed for mild pain, moderate pain, fever or headache.     buPROPion (WELLBUTRIN XL) 150 MG 24 hr tablet Take 300 mg by mouth daily.     cephALEXin (KEFLEX) 250 MG capsule Take 250 mg by mouth daily.     Cholecalciferol (VITAMIN D) 125 MCG (5000 UT) CAPS Take 5,000 Units by mouth daily.     Coenzyme Q10 200 MG TABS Take 200 mg by mouth daily.     Cyanocobalamin (B-12 PO) Take 1 tablet by mouth daily.     Erenumab-aooe (AIMOVIG) 140 MG/ML SOAJ Inject 140 mg into the skin every 28 (twenty-eight) days. 1.12 mL 11   flecainide (TAMBOCOR) 100 MG tablet Take 1 tablet by mouth twice daily 180 tablet 0   fluticasone (FLONASE) 50 MCG/ACT nasal spray  Place 2 sprays into both nostrils daily. 16 g 3   Magnesium Oxide 420 MG TABS Take 420 mg by mouth daily.     melatonin 5 MG TABS Take 5 mg by mouth at bedtime.     Multiple Vitamin (MULTIVITAMIN WITH MINERALS) TABS tablet Take 1 tablet by mouth daily.     nitroGLYCERIN (NITRODUR - DOSED IN MG/24 HR) 0.2 mg/hr patch Place 1 patch (0.2 mg total) onto the skin daily. Cut the patch into 1/4ths --> place 1/4 patch over affected area, change every 24 hours 30 patch 1   ondansetron (ZOFRAN ODT) 4 MG disintegrating tablet Take 1 tablet (4 mg total) by mouth every 8 (eight) hours as needed for nausea or vomiting. 20 tablet 0   pyridOXINE (VITAMIN B6) 100 MG tablet Take 100 mg by mouth daily.     rosuvastatin (CRESTOR) 20 MG tablet Take 1 tablet by mouth once daily 90 tablet 0   sertraline (ZOLOFT) 50 MG tablet Take 1 tablet (50 mg total) by mouth daily. (Patient taking differently: Take 50 mg by mouth in the morning and at bedtime.) 30 tablet 6   traZODone (DESYREL) 50 MG tablet TAKE 1/2 TO 1 (ONE-HALF TO ONE) TABLET BY MOUTH AT BEDTIME AS NEEDED FOR SLEEP 30 tablet 0  XARELTO 20 MG TABS tablet TAKE 1 TABLET BY MOUTH ONCE DAILY WITH SUPPER 90 tablet 0   pantoprazole (PROTONIX) 20 MG tablet Take 1 tablet (20 mg total) by mouth 2 (two) times daily before a meal. 180 tablet 3   No current facility-administered medications for this visit.     Objective:  BP 110/64   Pulse 64   Temp 98.1 F (36.7 C)   Ht '5\' 8"'$  (1.727 m)   Wt 148 lb 3.2 oz (67.2 kg)   LMP  (LMP Unknown)   SpO2 96%   BMI 22.53 kg/m  Gen: NAD, resting comfortably Pharynx with mild drainage, nasal turbinates with some clear drainage CV: RRR no murmurs rubs or gallops Lungs: CTAB no crackles, wheeze, rhonchi Ext: no edema Skin: warm, dry    Assessment and Plan    #Dystonia- scheduled with Dr. Krista Blue next week.  -sinemet without relief - had tried propranoll but even with reduced dose had to stop- low BP -Per Dr. Casimiro Needle- Also  started on ativan half mg in Am and 1 mg at night for dystonia- has not helped much   #Atrial fibrillation- sees Dr. Harrington Challenger S:Compliant with Xarelto 20 mg for anticoagulation.  Flecainide as an antiarrhythmic on '100mg'$   twice daily  A/P: appropriately anticoagulated and treated with antiarrhythmic - continue current medicine   #Major depressive disorder  working with psychiatry Dr. Casimiro Needle.  S: currently on sertraline '50mg'$   but being transitioned and wellbutrin 300 mg, trazodone 50 mg before bed    11/21/2022    2:31 PM 09/29/2022    3:14 PM 10/17/2021    8:08 AM  Depression screen PHQ 2/9  Decreased Interest 0 0 1  Down, Depressed, Hopeless 0 0 1  PHQ - 2 Score 0 0 2  Altered sleeping 2 0 3  Tired, decreased energy 2 0 3  Change in appetite 0 0 0  Feeling bad or failure about yourself  0 0 0  Trouble concentrating 0 0 3  Moving slowly or fidgety/restless 0 0 1  Suicidal thoughts 0 0 0  PHQ-9 Score 4 0 12  Difficult doing work/chores Not difficult at all Not difficult at all Somewhat difficult  A/P: full remission- continue current medications and psychiatry follow up    #Hyperlipidemia/aortic atherosclerosis- incidental finding on 11/04/19 CT S:compliant rosuvastatin '20mg'$ . LDL goal at least under 100 prefer under 70. She wants to work on lifestyle as much as able Lab Results  Component Value Date   CHOL 197 06/06/2021   HDL 77.20 06/06/2021   LDLCALC 96 06/06/2021   LDLDIRECT 104.0 11/01/2019   TRIG 118.0 06/06/2021   CHOLHDL 3 06/06/2021  A/P: lipids above ideal goal- she wants to work on lifestyle as long as LDL under 100- continue current medications and update lipids Aortic atherosclerosis (presumed stable)- LDL goal ideally <70 - for now continue current medications     #GERD- no longer with GI S:compliant with BID PPI- gastritis history 2022. Remains on b12. Has trialed off and had flares A/P: stable- continue current medicines  - discussed could trial down to 1 tablet per  day of pantoprazole  before meal if continues to do well on medicine.    # Chronic Cough S:had a cold 3 weeks ago and feels improved but has lingering cough. Already on PPI regularly.  Did get a new cat a year ago but this wa shappening prior to that.   In additoin has had long term cough for over a year- light  amount of phlegm and feels upper chest congestion. Occasional mild shortness of breath- for intsance hard time walking and talking. Does not worsen other than if she gets sick  Mother and sister had copd- both smoked- she had a lot of 2nd hand smoke exposure  Watery itch eyes and runny nose- blows 2-3x a day.  A/P:wonder if allergies contributing.  -lets try claritin, allegra, zyrtec before bed- also add flonase in the morning. Already on PPI. No asthma history. Not on ace-i -if not better (and chest x-ray ok )within 3 weeks refer to pulmonology- reach out to [place]   #ongoing right buttocks pain- working with Dr. Sammuel Hines and Dr. [name]- but ongoing issues- also on nitroglycerin, also taken off of zoloft and going to be placed on cymbalta by psychiatry  %Recurrent UTI- has seen urology. Had cystoscopy. On chronic keflex for prevention   #Last DEXA 2017- refer back for repeat  Recommended follow up: Return in about 6 months (around 05/24/2023) for physical or sooner if needed.Schedule b4 you leave. Future Appointments  Date Time Provider [place]  11/24/2022  8:15 AM Elba Barman, DO [facility] None  12/02/2022  3:00 PM Edythe Clarity, [facility] None  02/11/2023  9:00 AM Shawn Route, Coralee Pesa, PA-C [facility] None    Lab/Order associations:   ICD-10-CM   1. Postmenopausal  Z78.0 DG Bone Density    2. Screening exam for skin cancer  Z12.83 Ambulatory referral to Dermatology    3. Atrial fibrillation, unspecified type ([place])  I48.91     4. Major depressive disorder in full remission, unspecified whether recurrent ([place])  F32.5     5. Aortic atherosclerosis (HCC)  I70.0     6.  Hyperlipidemia, unspecified hyperlipidemia type  E78.5 Comprehensive metabolic panel    CBC with Differential/Platelet    Lipid panel    7. Chronic cough  R05.3 DG Chest 2 View      Meds ordered this encounter  Medications   pantoprazole (PROTONIX) 20 MG tablet    Sig: Take 1 tablet (20 mg total) by mouth 2 (two) times daily before a meal.    Dispense:  180 tablet    Refill:  3   fluticasone (FLONASE) 50 MCG/ACT nasal spray    Sig: Place 2 sprays into both nostrils daily.    Dispense:  16 g    Refill:  3    Return precautions advised.  Garret Reddish, MD

## 2022-11-21 NOTE — Patient Instructions (Addendum)
-   discussed could trial down to 1 tablet per day of pantoprazole  before meal if continues to do well on medicine.  Hold off on changing to once a day until we figure cough out  -lets try claritin, allegra, zyrtec before bed- also add flonase in the morning -if not better (and chest x-ray ok )within 3 weeks refer to pulmonology- reach out to [place]  Please go to [facility]  [facility] - located [address] across the street from [place] - in the basement - Hours: 8:30-5:00 PM M-F (with lunch from 12:30- 1 PM). You do NOT need an appointment.    Schedule your bone density test at check out desk.  - located [address] across [address] from [place] - in the basement - you DO NEED an appointment for the bone density tests.   We will either call you (or see alternate below) within two weeks about your referral to dermatology . Our referral specialist will sometimes also send you a mychart link once referral is approved and then you will call the # listed on there (let us know if you do not see this within 2 weeks or have not received call)  Recommended follow up: Return in about 6 months (around 05/24/2023) for physical or sooner if needed.Schedule b4 you leave.

## 2022-11-24 ENCOUNTER — Ambulatory Visit: Payer: Medicare HMO | Admitting: Sports Medicine

## 2022-11-24 ENCOUNTER — Encounter: Payer: Self-pay | Admitting: Family Medicine

## 2022-11-24 ENCOUNTER — Encounter: Payer: Self-pay | Admitting: Sports Medicine

## 2022-11-24 DIAGNOSIS — M25551 Pain in right hip: Secondary | ICD-10-CM | POA: Diagnosis not present

## 2022-11-24 DIAGNOSIS — S76011S Strain of muscle, fascia and tendon of right hip, sequela: Secondary | ICD-10-CM

## 2022-11-24 DIAGNOSIS — R238 Other skin changes: Secondary | ICD-10-CM

## 2022-11-24 NOTE — Progress Notes (Signed)
Feels like these treatments are helping

## 2022-11-24 NOTE — Progress Notes (Signed)
Danielle Rocha - 72 y.o. female MRN [id number]  Date of birth: [birth year]  Office Visit Note: Visit Date: 11/24/2022 PCP: Marin Olp, MD Referred by: Marin Olp, MD  Subjective: Chief Complaint  Patient presents with   Right Hip - Follow-up   HPI: Danielle Rocha is a pleasant 72 y.o. female who presents today for follow-up of acute on chronic left lateral hip pain.  She has had a chronic history of lateral hip pain.  She had a piriformis release back in 1998 or 1999.  She had a lumbar fusion to see if this would help relieve her pain.  Neither of these helped improve her pain.  She has had injections in the past as well. She is about 7 months s/p right hip gluteus medius repair by my partner, Dr. Sammuel Hines.  Danielle Rocha is hopeful to continue shockwave treatment therapy.  We did perform a treatment to the lateral hip 1 week ago, noticing some relief.  She continues with the nitroglycerin patch, changing this once daily.  She is tolerating this well.  Given that she has has a sensitivity with light touch over the region, we did discuss considering changing Zoloft to a different TCA or SNRI.  She did talk with her psychologist/psychiatrist, wanting to start Cymbalta today.  She will discontinue Zoloft.  Pertinent ROS were reviewed with the patient and found to be negative unless otherwise specified above in HPI.   Assessment & Plan: Visit Diagnoses:  1. Lateral pain of right hip   2. Tear of right gluteus medius tendon, sequela   3. Skin sensitivity    Plan: Discussed with Addlyn all of the treatment options for her exacerbation of her chronic lateral hip pain with previous gluteus medius tear. It is reassuring she received some relief from the extracorporeal shockwave treatment last week and continuing the nitroglycerin patch protocol.  We both have mutually agreed to continue treatments weekly for 4-5 sessions consistently to see if we can get cumulative benefit.  Given her  chronic pain as well as skin hypersensitivity, we discussed transitioning off of Zoloft to an TCA versus SNRI, per discussion with her pysch provider she will discontinue Zoloft as beginning Cymbalta 30 mg daily.  She will increase to 60 mg after 2 weeks per their discretion.  I would like her to continue the nitroglycerin patch 0.2 mg every 24 hours.  She will continue her home rehab and hip stabilization exercises on the days when do not perform shockwave therapy.  Follow-up: Return in about 1 week (around 12/01/2022).   Meds & Orders:   - Continue Nitroglycerin 0.'2mg'$  patch every 24 hours - Discontinue Zoloft --> begin Cymbalta '30mg'$  qd   Procedures: Procedure: ECSWT Indications:  Gluteus medius and greater trochanteric pain   Procedure Details Consent: Risks of procedure as well as the alternatives and risks of each were explained to the patient.  Verbal consent for procedure obtained. Time Out: Verified patient identification, verified procedure, site was marked, verified correct patient position. The area was cleaned with alcohol swab.     The right greater trochanteric region and gluteus medius tendon was targeted for Extracorporeal shockwave therapy.    Preset: Greater Trochanteric Bursitis Power Level: 120 mJ Frequency: 12 Hz Impulse/cycles: 3000 Head size: Regular   Patient tolerated procedure well without immediate complications.      Clinical History: EXAM: MRI CERVICAL SPINE WITHOUT CONTRAST   TECHNIQUE: Multiplanar, multisequence MR imaging of the cervical spine was performed. No intravenous contrast was administered.  COMPARISON:  01/05/2022 brain MRI   FINDINGS: Alignment: Mild anterolisthesis at C2-3.   Vertebrae: No fracture, evidence of discitis, or bone lesion.   Cord: Normal signal and morphology.   Posterior Fossa, vertebral arteries, paraspinal tissues: Negative.   Disc levels:   C2-3: Slight anterolisthesis although no significant  degenerative changes noted   C3-4: Uncovertebral ridging asymmetric to the left where there is moderate foraminal stenosis.   C4-5: Facet spurring and left uncovertebral ridging with mild left foraminal narrowing.   C5-6: Disc narrowing with bulging and uncovertebral ridging. Mild left foraminal narrowing   C6-7: Degenerative facet spurring asymmetric to the left. Mild left uncovertebral ridging. Moderate left foraminal narrowing. Right foraminal root sleeve cyst.   C7-T1:Unremarkable.   IMPRESSION: 1. Cervical spine degeneration causing moderate foraminal stenosis on the left at C3-4 and C6-7. 2. No inflammation or impingement seen at the skull base to explain radiation to the head.     Electronically Signed   By: Jorje Guild M.D.   On: 04/10/2022 17:05  She reports that she has never smoked. She has never used smokeless tobacco. No results for input(s): "HGBA1C", "LABURIC" in the last 8760 hours.  Objective:   Vital Signs: LMP  (LMP Unknown)   Physical Exam  Gen: Well-appearing, in no acute distress; non-toxic CV:  Well-perfused. Warm.  Resp: Breathing unlabored on room air; no wheezing. Psych: Fluid speech in conversation; appropriate affect; normal thought process Neuro: Sensation intact throughout. No gross coordination deficits.   Ortho Exam -  Right hip:  + TTP over the greater trochanteric and insertional gluteal tendons.  There is some mild TTP palpating down the IT band.  There are 2 well-healed scars overlying the lateral hip without signs of infection.  No restriction to internal or external rotation about the hip. Neurovascularly intact distally. There is some skin hypersensitivity with mild TTP overlying lateral hip.  Imaging:  Past Medical/Family/Surgical/Social History: Medications & Allergies reviewed per EMR, new medications updated. Patient Active Problem List   Diagnosis Date Noted   Tear of right gluteus medius tendon    Sore in nose  07/30/2021   Involuntary movements 02/14/2020   Aortic atherosclerosis ([place]) 02/07/2020   Delayed gastric emptying 02/07/2020   Dystonia    Restless legs 07/27/2018   Recurrent UTI 10/07/2016   Major depression in full remission ([place]) 09/02/2016   GERD (gastroesophageal reflux disease) 01/30/2016   History of SI/intentional drug overdose 11/22/2015   Atrial fibrillation (HCC)    Chronic pain syndrome    Hyperlipidemia    Past Medical History:  Diagnosis Date   Anemia    Arthritis    DJD, low back, thumb   Atrial fibrillation (HCC)    Xarelto anticoagulation. Flecainide antiarrythmic    Chicken pox    Chronic pain syndrome    On disability. History of bilateral hip pain, low back pain. Gabapentin 400 BID, percocet 1 tablet daily per prior provider.    Colon polyp    awaiting records   Depression    zoloft '100mg'$ , remeron '30mg'$  per psychiatry. ambien '10mg'$  per psychiatry to help wtih sleep element.    Dysrhythmia    Dystonia    described as psychogenic dystonia. Pain and twisting from upper chest and up with triggers "wind, creamy food" on TID ativan per psychiatry previously.    Fibromyalgia    GERD (gastroesophageal reflux disease)    omeprazole OTC   Goiter    states multiple imaging tests, has had biopsies   Hyperlipidemia  lovastatin '20mg'$    Stroke (HCC)    TIA (left side of face and body decreased sensitivity than right face and side)   TIA (transient ischemic attack)    Vaginal atrophy    estrace vaginal cream   Family History  Problem Relation Age of Onset   Alcohol abuse Mother        possible she had stomach cancer as well but unsure   Hypertension Father    Alcohol abuse Father    Pancreatic cancer Father    Cancer Brother        spindle cell RLE   Diabetes Brother    Cancer Brother        tonsil cancer   Skin cancer Brother    Colon cancer Neg Hx    Colon polyps Neg Hx    Stomach cancer Neg Hx    Past Surgical History:  Procedure Laterality Date    ATRIAL FIBRILLATION ABLATION     BIOPSY THYROID     fusion l4-l5  09/22/1998   GLUTEUS MINIMUS REPAIR Right 05/19/2022   Procedure: RIGHT HIP GLUTEUS MEDIUS TENDON REPAIR;  Surgeon: Vanetta Mulders, MD;  Location: [place];  Service: Orthopedics;  Laterality: Right;   LAMINECTOMY     L4-L5   OTHER SURGICAL HISTORY     tennis elbow surgery   piriformis release  09/22/1997   right hip   TONSILLECTOMY AND ADENOIDECTOMY  age 16   VAGINAL HYSTERECTOMY  09/23/1991   Social History   Occupational History   Occupation: retired  Tobacco Use   Smoking status: Never   Smokeless tobacco: Never  Vaping Use   Vaping Use: Never used  Substance and Sexual Activity   Alcohol use: No    Alcohol/week: 0.0 standard drinks of alcohol   Drug use: No   Sexual activity: Not Currently    Partners: Male

## 2022-11-25 ENCOUNTER — Inpatient Hospital Stay: Admission: RE | Admit: 2022-11-25 | Payer: Medicare HMO | Source: Ambulatory Visit

## 2022-11-25 ENCOUNTER — Other Ambulatory Visit (INDEPENDENT_AMBULATORY_CARE_PROVIDER_SITE_OTHER): Payer: Medicare HMO

## 2022-11-25 ENCOUNTER — Ambulatory Visit (INDEPENDENT_AMBULATORY_CARE_PROVIDER_SITE_OTHER)
Admission: RE | Admit: 2022-11-25 | Discharge: 2022-11-25 | Disposition: A | Discharge status: Home / Self Care | Payer: Medicare HMO | Source: Ambulatory Visit | Attending: Family Medicine | Admitting: Family Medicine

## 2022-11-25 ENCOUNTER — Other Ambulatory Visit: Payer: Self-pay | Admitting: Family Medicine

## 2022-11-25 DIAGNOSIS — N390 Urinary tract infection, site not specified: Secondary | ICD-10-CM | POA: Diagnosis not present

## 2022-11-25 DIAGNOSIS — Z780 Asymptomatic menopausal state: Secondary | ICD-10-CM

## 2022-11-25 DIAGNOSIS — E785 Hyperlipidemia, unspecified: Secondary | ICD-10-CM | POA: Diagnosis not present

## 2022-11-25 DIAGNOSIS — R053 Chronic cough: Secondary | ICD-10-CM

## 2022-11-25 DIAGNOSIS — R350 Frequency of micturition: Secondary | ICD-10-CM | POA: Diagnosis not present

## 2022-11-25 LAB — CBC WITH DIFFERENTIAL/PLATELET
Basophils Absolute: 0 10*3/uL (ref 0.0–0.1)
Basophils Relative: 0.5 % (ref 0.0–3.0)
Eosinophils Absolute: 0.1 10*3/uL (ref 0.0–0.7)
Eosinophils Relative: 1.7 % (ref 0.0–5.0)
HCT: 42.4 % (ref 36.0–46.0)
Hemoglobin: 14.2 g/dL (ref 12.0–15.0)
Lymphocytes Relative: 25.5 % (ref 12.0–46.0)
Lymphs Abs: 2.1 10*3/uL (ref 0.7–4.0)
MCHC: 33.6 g/dL (ref 30.0–36.0)
MCV: 84.8 fl (ref 78.0–100.0)
Monocytes Absolute: 0.8 10*3/uL (ref 0.1–1.0)
Monocytes Relative: 9.8 % (ref 3.0–12.0)
Neutro Abs: 5.2 10*3/uL (ref 1.4–7.7)
Neutrophils Relative %: 62.5 % (ref 43.0–77.0)
Platelets: 244 10*3/uL (ref 150.0–400.0)
RBC: 4.99 Mil/uL (ref 3.87–5.11)
RDW: 15.1 % (ref 11.5–15.5)
WBC: 8.3 10*3/uL (ref 4.0–10.5)

## 2022-11-25 LAB — COMPREHENSIVE METABOLIC PANEL
ALT: 19 U/L (ref 0–35)
AST: 19 U/L (ref 0–37)
Albumin: 4.2 g/dL (ref 3.5–5.2)
Alkaline Phosphatase: 51 U/L (ref 39–117)
BUN: 19 mg/dL (ref 6–23)
CO2: 30 mEq/L (ref 19–32)
Calcium: 9.8 mg/dL (ref 8.4–10.5)
Chloride: 105 mEq/L (ref 96–112)
Creatinine, Ser: 0.84 mg/dL (ref 0.40–1.20)
GFR: 70.32 mL/min (ref >60.00)
Glucose, Bld: 96 mg/dL (ref 70–99)
Potassium: 3.9 mEq/L (ref 3.5–5.1)
Sodium: 143 mEq/L (ref 135–145)
Total Bilirubin: 0.6 mg/dL (ref 0.2–1.2)
Total Protein: 6.6 g/dL (ref 6.0–8.3)

## 2022-11-25 LAB — LIPID PANEL
Cholesterol: 199 mg/dL (ref 0–200)
HDL: 82.7 mg/dL (ref >39.00)
LDL Cholesterol: 97 mg/dL (ref 0–99)
NonHDL: 116.59
Total CHOL/HDL Ratio: 2
Triglycerides: 97 mg/dL (ref 0.0–149.0)
VLDL: 19.4 mg/dL (ref 0.0–40.0)

## 2022-11-25 MED ORDER — TRAZODONE HCL 50 MG PO TABS
50.0000 mg | ORAL_TABLET | Freq: Every evening | ORAL | 0 refills | Status: DC | PRN
Start: 2022-11-25 — End: 2022-12-30

## 2022-11-26 ENCOUNTER — Inpatient Hospital Stay: Admission: RE | Admit: 2022-11-26 | Payer: Medicare HMO | Source: Ambulatory Visit

## 2022-11-27 ENCOUNTER — Ambulatory Visit: Payer: Medicare HMO | Admitting: Sports Medicine

## 2022-11-29 ENCOUNTER — Other Ambulatory Visit: Payer: Self-pay | Admitting: Family Medicine

## 2022-12-01 ENCOUNTER — Telehealth: Payer: Self-pay | Admitting: Pharmacist

## 2022-12-01 ENCOUNTER — Ambulatory Visit: Payer: Medicare HMO | Admitting: Sports Medicine

## 2022-12-01 ENCOUNTER — Encounter: Payer: Self-pay | Admitting: Sports Medicine

## 2022-12-01 DIAGNOSIS — M25551 Pain in right hip: Secondary | ICD-10-CM | POA: Diagnosis not present

## 2022-12-01 DIAGNOSIS — S76011S Strain of muscle, fascia and tendon of right hip, sequela: Secondary | ICD-10-CM

## 2022-12-01 NOTE — Progress Notes (Signed)
Care Management & Coordination Services Pharmacy Team  Reason for Encounter: Appointment Reminder  Contacted patient to confirm telephone appointment with Leata Mouse, PharmD on 12/02/2022 at 3 pm. Spoke with patient on 12/01/2022    Star Rating Drugs:  Rosuvastatin 20 mg last filled 08/28/2022 90 [facility]   Care Gaps: Annual wellness visit in last year? Yes   Future Appointments  Date Time Provider [place]  12/02/2022  3:00 PM Edythe Clarity, [place] [facility] None  02/11/2023  9:00 AM Rondel Jumbo, PA-C [facility] None   April D Calhoun, [place] Pharmacist Assistant [phone]

## 2022-12-01 NOTE — Progress Notes (Signed)
Doing ok; states she feels like the pain is getting better  Feels like these treatments are helping

## 2022-12-01 NOTE — Progress Notes (Signed)
Danielle Rocha - 72 y.o. female MRN [id number]  Date of birth: 07-Sep-1951  Office Visit Note: Visit Date: 12/01/2022 PCP: Marin Olp, MD Referred by: Marin Olp, MD  Subjective: Chief Complaint  Patient presents with   Right Hip - Follow-up   HPI: Danielle Rocha is a pleasant 72 y.o. female who presents today for acute on chronic left lateral hip pain.   She has had a chronic history of lateral hip pain.  She had a piriformis release back in 1998 or 1999.  She had a lumbar fusion to see if this would help relieve her pain.  Neither of these helped improve her pain.  She has had injections in the past as well. She is about 7 months s/p right hip gluteus medius repair by my partner, Dr. Sammuel Hines.  Consistent ECSWT #3 performed today. Continues on Cymbalta medicine, also taking Nitroglycerin 0.'2mg'$  qd patch. Feels like she is   Pertinent ROS were reviewed with the patient and found to be negative unless otherwise specified above in HPI.   Assessment & Plan: Visit Diagnoses:  1. Tear of right gluteus medius tendon, sequela   2. Lateral pain of right hip    Plan: Discussed with Danielle Rocha that it is reassuring she is having some relief from the shockwave therapy, the nitroglycerin patch protocol.  Through shared decision making, elected to proceed with additional shockwave treatment today.  She will continue nitroglycerin patch 0.2 mg changing every 24 hours.  She is uptitrating her Cymbalta from 30 to 60 mg after next week per psych.  I would like her to continue her home hip stabilization and strengthening exercises.  She will follow-up in 1 week.  Follow-up: Return in about 1 week (around 12/08/2022) for for lateral hip pain.   Meds & Orders: No orders of the defined types were placed in this encounter.  No orders of the defined types were placed in this encounter.    Procedures: Procedure: ECSWT Indications:  Gluteus medius and greater trochanteric pain   Procedure  Details Consent: Risks of procedure as well as the alternatives and risks of each were explained to the patient.  Verbal consent for procedure obtained. Time Out: Verified patient identification, verified procedure, site was marked, verified correct patient position. The area was cleaned with alcohol swab.     The right greater trochanteric region and gluteus medius tendon was targeted for Extracorporeal shockwave therapy.    Preset: Greater Trochanteric Bursitis Power Level: 120 mJ Frequency: 12 Hz Impulse/cycles: 3000 Head size: Regular   Patient tolerated procedure well without immediate complications.        Clinical History:   She reports that she has never smoked. She has never used smokeless tobacco. No results for input(s): "HGBA1C", "LABURIC" in the last 8760 hours.  Objective:   Vital Signs: LMP  (LMP Unknown)   Physical Exam  Gen: Well-appearing, in no acute distress; non-toxic CV: Well-perfused. Warm.  Resp: Breathing unlabored on room air; no wheezing. Psych: Fluid speech in conversation; appropriate affect; normal thought process Neuro: Sensation intact throughout. No gross coordination deficits.   Ortho Exam - Right hip:  + TTP over the greater trochanteric and insertional gluteal tendons.  There is some mild TTP palpating down the IT band.  There are 2 well-healed scars overlying the lateral hip without signs of infection.  No restriction to internal or external rotation about the hip. Neurovascularly intact distally. There is some skin hypersensitivity with mild TTP overlying lateral hip.  Imaging: No results found.  Past Medical/Family/Surgical/Social History: Medications & Allergies reviewed per EMR, new medications updated. Patient Active Problem List   Diagnosis Date Noted   Tear of right gluteus medius tendon    Sore in nose 07/30/2021   Involuntary movements 02/14/2020   Aortic atherosclerosis ([place]) 02/07/2020   Delayed gastric emptying 02/07/2020    Dystonia    Restless legs 07/27/2018   Recurrent UTI 10/07/2016   Major depression in full remission ([place]) 09/02/2016   GERD (gastroesophageal reflux disease) 01/30/2016   History of SI/intentional drug overdose 11/22/2015   Atrial fibrillation (HCC)    Chronic pain syndrome    Hyperlipidemia    Past Medical History:  Diagnosis Date   Anemia    Arthritis    DJD, low back, thumb   Atrial fibrillation (HCC)    Xarelto anticoagulation. Flecainide antiarrythmic    Chicken pox    Chronic pain syndrome    On disability. History of bilateral hip pain, low back pain. Gabapentin 400 BID, percocet 1 tablet daily per prior provider.    Colon polyp    awaiting records   Depression    zoloft '100mg'$ , remeron '30mg'$  per psychiatry. ambien '10mg'$  per psychiatry to help wtih sleep element.    Dysrhythmia    Dystonia    described as psychogenic dystonia. Pain and twisting from upper chest and up with triggers "wind, creamy food" on TID ativan per psychiatry previously.    Fibromyalgia    GERD (gastroesophageal reflux disease)    omeprazole OTC   Goiter    states multiple imaging tests, has had biopsies   Hyperlipidemia    lovastatin '20mg'$    Stroke (HCC)    TIA (left side of face and body decreased sensitivity than right face and side)   TIA (transient ischemic attack)    Vaginal atrophy    estrace vaginal cream   Family History  Problem Relation Age of Onset   Alcohol abuse Mother        possible she had stomach cancer as well but unsure   Hypertension Father    Alcohol abuse Father    Pancreatic cancer Father    Cancer Brother        spindle cell RLE   Diabetes Brother    Cancer Brother        tonsil cancer   Skin cancer Brother    Colon cancer Neg Hx    Colon polyps Neg Hx    Stomach cancer Neg Hx    Past Surgical History:  Procedure Laterality Date   ATRIAL FIBRILLATION ABLATION     BIOPSY THYROID     fusion l4-l5  09/22/1998   GLUTEUS MINIMUS REPAIR Right 05/19/2022    Procedure: RIGHT HIP GLUTEUS MEDIUS TENDON REPAIR;  Surgeon: Vanetta Mulders, MD;  Location: [place];  Service: Orthopedics;  Laterality: Right;   LAMINECTOMY     L4-L5   OTHER SURGICAL HISTORY     tennis elbow surgery   piriformis release  09/22/1997   right hip   TONSILLECTOMY AND ADENOIDECTOMY  age 80   VAGINAL HYSTERECTOMY  09/23/1991   Social History   Occupational History   Occupation: retired  Tobacco Use   Smoking status: Never   Smokeless tobacco: Never  Vaping Use   Vaping Use: Never used  Substance and Sexual Activity   Alcohol use: No    Alcohol/week: 0.0 standard drinks of alcohol   Drug use: No   Sexual activity: Not Currently    Partners:  Male

## 2022-12-02 ENCOUNTER — Ambulatory Visit: Payer: Medicare HMO | Admitting: Pharmacist

## 2022-12-02 NOTE — Progress Notes (Signed)
Care Management & Coordination Services Pharmacy Note  12/05/2022 Name:  Danielle Rocha MRN:  [id number] DOB:  02/16/1951  Summary: PharmD FU visit.  Switched to Cymbalta, d/c Zoloft.  So far tolerating well, counseled on taking full 4-6 weeks to reach potential.  LDL above goal discussed with PCP at her last visit wants to work on lifestyle mods.  ASCVD risk 8.7%  Recommendations/Changes made from today's visit: No changes - assess LDL at next screening  Follow up plan: FU 6 months 30 days CMA to assess Cymbalta   Subjective: Danielle Rocha is an 72 y.o. year old female who is a primary patient of Yong Channel, Brayton Mars, MD.  The care coordination team was consulted for assistance with disease management and care coordination needs.    Engaged with patient by telephone for follow up visit.  Recent office visits:  12/23/2021 OV (PCP)  Marin Olp, MD; -We opted to try a much lower dose of trazodone at 25 to 50 mg -Stay off gabapentin does seem to help restless legs some - Also discussed reviewing this with psychiatry.  I do not think sertraline at 50 mg plus low-dose trazodone place her at significant risk for serotonin syndrome   Recent consult visits:  05/30/2022 OV (Orthopedics) Vanetta Mulders, MD; no medication changes indicated.   04/17/2022 OV (Orthopedics) Vanetta Mulders, MD; no medication changes indicated.   04/01/2022 OV (Orthopedics) Barnet Pall E, NP; no medication changes indicated.   03/21/2022 OV (Orthopedics) Vanetta Mulders, MD; no medication changes indicated.   03/20/2022 OV (Otolaryngology) Beckie Salts, MD; no medication changes indicated.   01/31/2022 OV (Orthopedics) Vanetta Mulders, MD; no medication changes indicated.   5/052023 OV (Neurology) Rondel Jumbo, PA-C; Start Emgality as directed    Hospital visits:  05/19/2022 Surgery - Rip Hip Gluteus Medius Tendon Repair   01/05/2022 ED visit for Migraine -Discussed trail of imitrex for headache  abortive and recommend follow up with PCP, possible referral to neurology if headaches persist   Objective:  Lab Results  Component Value Date   CREATININE 0.84 11/25/2022   BUN 19 11/25/2022   GFR 70.32 11/25/2022   GFRNONAA >60 05/19/2022   GFRAA >60 09/03/2016   NA 143 11/25/2022   K 3.9 11/25/2022   CALCIUM 9.8 11/25/2022   CO2 30 11/25/2022   GLUCOSE 96 11/25/2022    Lab Results  Component Value Date/Time   GFR 70.32 11/25/2022 09:43 AM   GFR 71.05 06/06/2021 10:07 AM    Last diabetic Eye exam: No results found for: "HMDIABEYEEXA"  Last diabetic Foot exam: No results found for: "HMDIABFOOTEX"   Lab Results  Component Value Date   CHOL 199 11/25/2022   HDL 82.70 11/25/2022   LDLCALC 97 11/25/2022   LDLDIRECT 104.0 11/01/2019   TRIG 97.0 11/25/2022   CHOLHDL 2 11/25/2022       Latest Ref Rng & Units 11/25/2022    9:43 AM 06/06/2021    5:18 PM 06/06/2021   10:07 AM  Hepatic Function  Total Protein 6.0 - 8.3 g/dL 6.6  6.8  6.7   Albumin 3.5 - 5.2 g/dL 4.2  4.1  4.1   AST 0 - 37 U/L 19  19  17    ALT 0 - 35 U/L 19  18  16    Alk Phosphatase 39 - 117 U/L 51  62  64   Total Bilirubin 0.2 - 1.2 mg/dL 0.6  0.6  0.5     Lab Results  Component Value Date/Time  TSH 0.73 06/06/2021 10:07 AM   TSH 0.54 11/01/2019 09:16 AM       Latest Ref Rng & Units 11/25/2022    9:43 AM 05/19/2022    2:00 PM 01/05/2022    9:33 AM  CBC  WBC 4.0 - 10.5 K/uL 8.3  6.9  6.3   Hemoglobin 12.0 - 15.0 g/dL 14.2  13.8  14.7   Hematocrit 36.0 - 46.0 % 42.4  39.1  44.2   Platelets 150.0 - 400.0 K/uL 244.0  205  233     Lab Results  Component Value Date/Time   VITAMINB12 960 (H) 06/18/2021 09:21 AM   VITAMINB12 905 10/28/2018 02:01 PM    Clinical ASCVD: No  The ASCVD Risk score (Arnett DK, et al., 2019) failed to calculate for the following reasons:   The patient has a prior MI or stroke diagnosis         11/21/2022    2:31 PM 09/29/2022    3:14 PM 10/17/2021    8:08 AM   Depression screen PHQ 2/9  Decreased Interest 0 0 1  Down, Depressed, Hopeless 0 0 1  PHQ - 2 Score 0 0 2  Altered sleeping 2 0 3  Tired, decreased energy 2 0 3  Change in appetite 0 0 0  Feeling bad or failure about yourself  0 0 0  Trouble concentrating 0 0 3  Moving slowly or fidgety/restless 0 0 1  Suicidal thoughts 0 0 0  PHQ-9 Score 4 0 12  Difficult doing work/chores Not difficult at all Not difficult at all Somewhat difficult     Social History   Tobacco Use  Smoking Status Never  Smokeless Tobacco Never   BP Readings from Last 3 Encounters:  11/21/22 110/64  11/03/22 122/70  07/28/22 124/73   Pulse Readings from Last 3 Encounters:  11/21/22 64  11/03/22 61  07/28/22 71   Wt Readings from Last 3 Encounters:  11/21/22 148 lb 3.2 oz (67.2 kg)  11/03/22 150 lb (68 kg)  09/29/22 156 lb (70.8 kg)   BMI Readings from Last 3 Encounters:  11/21/22 22.53 kg/m  11/03/22 22.81 kg/m  09/29/22 23.72 kg/m    Allergies  Allergen Reactions   Reglan [Metoclopramide] Other (See Comments)    Suicidal thoughts   Ciprofloxacin Diarrhea   Clindamycin/Lincomycin Diarrhea   Doxycycline Diarrhea and Nausea And Vomiting   Macrobid [Nitrofurantoin Monohyd Macro] Rash    Rash on lip   Sulfa Antibiotics Rash    Medications Reviewed Today     Reviewed by Edythe Clarity, RPH (Pharmacist) on 12/05/22 at 1430  Med List Status: <None>   Medication Order Taking? Sig Documenting Provider Last Dose Status Informant  acetaminophen (TYLENOL) 500 MG tablet [id number] Yes Take 1,000 mg by mouth every 6 (six) hours as needed for mild pain, moderate pain, fever or headache. [provider] Taking Active Self  buPROPion (WELLBUTRIN XL) 150 MG 24 hr tablet [id number] Yes Take 300 mg by mouth daily. [provider] Taking Active Self  cephALEXin (KEFLEX) 250 MG capsule [id number] Yes Take 250 mg by mouth daily. [provider] Taking Active Self           Med  Note Kellie Simmering, Jenene Slicker   Wed May 14, 2022 10:55 AM) Continuous therapy  Cholecalciferol (VITAMIN D) 125 MCG (5000 UT) CAPS [id number] Yes Take 5,000 Units by mouth daily. [provider] Taking Active Self  Coenzyme Q10 200 MG TABS [id number] Yes Take 200 mg  by mouth daily. [provider] Taking Active Self  Cyanocobalamin (B-12 PO) [id number] Yes Take 1 tablet by mouth daily. [provider] Taking Active Self  DULoxetine (CYMBALTA) 60 MG capsule [id number] Yes Take 60 mg by mouth daily. [provider] Taking Active   Erenumab-aooe (AIMOVIG) 140 MG/ML Darden Palmer [id number] Yes Inject 140 mg into the skin every 28 (twenty-eight) days. Rondel Jumbo, PA-C Taking Active   flecainide (TAMBOCOR) 100 MG tablet [id number] Yes Take 1 tablet by mouth twice daily Marin Olp, MD Taking Active   fluticasone [facility]) 50 MCG/ACT nasal spray [id number] Yes Place 2 sprays into both nostrils daily. Marin Olp, MD Taking Active   Magnesium Oxide 420 MG TABS [id number] Yes Take 420 mg by mouth daily. [provider] Taking Active Self  melatonin 5 MG TABS [id number] Yes Take 5 mg by mouth at bedtime. [provider] Taking Active Self  Multiple Vitamin (MULTIVITAMIN WITH MINERALS) TABS tablet [id number] Yes Take 1 tablet by mouth daily. [provider] Taking Active Self  nitroGLYCERIN (NITRODUR - DOSED IN MG/24 HR) 0.2 mg/hr patch [id number] Yes Place 1 patch (0.2 mg total) onto the skin daily. Cut the patch into 1/4ths --> place 1/4 patch over affected area, change every 24 hours Elba Barman, DO Taking Active   ondansetron (ZOFRAN ODT) 4 MG disintegrating tablet [id number] Yes Take 1 tablet (4 mg total) by mouth every 8 (eight) hours as needed for nausea or vomiting. Gareth Morgan, MD Taking Active Self  pantoprazole (PROTONIX) 20 MG tablet [id number] Yes Take 1 tablet (20 mg total) by mouth 2 (two) times daily before a meal. Marin Olp, MD  Taking Active   pyridOXINE (VITAMIN B6) 100 MG tablet [id number] Yes Take 100 mg by mouth daily. [provider] Taking Active Self  rosuvastatin (CRESTOR) 20 MG tablet [id number] Yes Take 1 tablet by mouth once daily Marin Olp, MD Taking Active   traZODone (DESYREL) 50 MG tablet [id number] Yes Take 1 tablet (50 mg total) by mouth at bedtime as needed for sleep. Marin Olp, MD Taking Active   XARELTO 20 MG TABS tablet [id number] Yes TAKE 1 TABLET BY MOUTH ONCE DAILY WITH SUPPER Marin Olp, MD Taking Active             SDOH:  (Social Determinants of Health) assessments and interventions performed: No Financial Resource Strain: Low Risk  (09/29/2022)   Overall Financial Resource Strain (CARDIA)    Difficulty of Paying Living Expenses: Not hard at all   Food Insecurity: No Food Insecurity (09/29/2022)   Hunger Vital Sign    Worried About Running Out of Food in the Last Year: Never true    Ran Out of Food in the Last Year: Never true    SDOH Interventions    [place] Office Visit from 11/21/2022 in [place] from 09/29/2022 in [place] Visit from 10/17/2021 in [place] Visit from 06/06/2021 in [place] from 08/04/2019 in [place] Visit from 08/03/2019 in [place] Interventions -- Intervention Not Indicated -- -- -- --  Housing Interventions -- Intervention Not Indicated -- -- -- --  Transportation Interventions -- Intervention Not Indicated -- -- -- --  Utilities Interventions -- Intervention Not Indicated -- -- -- --  Depression Interventions/Treatment  PHQ2-9 Score <4 Follow-up  Not Indicated -- Medication, Referral to Psychiatry Referral to Psychiatry Medication Counseling, Medication  Financial Strain  Interventions -- Intervention Not Indicated -- -- -- --  Physical Activity Interventions -- Intervention Not Indicated -- -- -- --  Stress Interventions -- Intervention Not Indicated -- -- -- --  Social Connections Interventions -- Intervention Not Indicated -- -- -- --       Medication Assistance: None required.  Patient affirms current coverage meets needs.  Medication Access: Within the past 30 days, how often has patient missed a dose of medication? 0 Is a pillbox or other method used to improve adherence? No  Factors that may affect medication adherence? no barriers identified Are meds synced by current pharmacy? No  Are meds delivered by current pharmacy? No  Does patient experience delays in picking up medications due to transportation concerns? No   [facility] Reviewed: Is patient disadvantaged to use [facility]?: Yes  Current Rx insurance plan: [employer] Name and location of Current pharmacy:  [facility], [place] [address] Phone: [phone] Fax:  [place] [address] Phone: [phone] Fax: [phone]  [facility] services reviewed with patient today?: Yes  Patient requests to transfer care to [facility]?: No  Reason patient declined to change pharmacies: Disadvantaged due to insurance/mail order  Compliance/Adherence/Medication fill history: Rosuvastatin 20 mg last filled 08/28/2022 90 DS     Care Gaps: Annual wellness visit in last year? Yes   Assessment/Plan                  Current Barriers:  LDL above goal Hip pain Sleep disturbances  Pharmacist Clinical Goal(s):  Patient will achieve improvement in LDL as evidenced by labs through collaboration with PharmD and provider.   Interventions: 1:1 collaboration with Marin Olp, MD  regarding development and update of comprehensive plan of care as evidenced by provider attestation and co-signature Inter-disciplinary care team collaboration (see longitudinal plan of care) Comprehensive medication review performed; medication list updated in electronic medical record  Hyperlipidemia: (LDL goal < 70) 12/02/22 -Uncontrolled, most recent LDL 97 -Current treatment: Rosuvastatin 20mg  Appropriate, Query effective -Medications previously tried: atorvastatin  -Current exercise habits: walks at least 4-5 days per week -Educated on Cholesterol goals;  Benefits of statin for ASCVD risk reduction; Importance of limiting foods high in cholesterol; -Above goal < 70 but patient wanted to work on dietary mods and exercise.  IF not at goal by next lipid panel would recommend increase to Crestor 40mg  daily.  Will assess after next labs.  Current 10 year ASCVD risk 8.7%.  No changes at this time, continue routine screens.  Atrial Fibrillation (Goal: prevent stroke and major bleeding) -Controlled -Current treatment: Rate control: Flecainide 100mg  twice daily Appropriate, Effective, Safe, Accessible Anticoagulation: Xarelto 20mg  Appropriate, Effective, Safe, Accessible -Medications previously tried: none noted -Home BP and HR readings: 100/60  -Counseled on increased risk of stroke due to Afib and benefits of anticoagulation for stroke prevention; bleeding risk associated with Xarelto and importance of self-monitoring for signs/symptoms of bleeding; -Recommended to continue current medication Assessed patient finances. She does not have any issues with Xarelto copay.  Depression/Anxiety (Goal: Minimize symptoms) 12/02/22 -Controlled -Current treatment: Duloxetine 60mg  daily Appropriate,Query Effective Bupropion XL 300mg  Appropriate, Effective, Safe, Query accessible -Medications previously tried/failed: olanzapine, trazodone, lorazepam -PHQ9:  PHQ9 SCORE ONLY 10/17/2021 09/09/2021  06/06/2021  PHQ-9 Total Score  12 0 15   -GAD7: No flowsheet data found.  -Educated on Benefits of medication for symptom control -Patient reports stable mood, meds are managed by Dr. Casimiro Needle psychiatrist.  Switched from Zoloft to Duloxetine.  Has only been on this for a few days.  So far tolerating fine.  At this point we will just continue to monitor and reassess PHQ-9 in 4 weeks.  Will have CMA FU to assess tolerance and efficacy.  GERD (Goal: Minimize symptoms) -Controlled -Current treatment - Pantoprazole 20mg  twice daily Query Appropriate, -Medications previously tried: none noted - She takes twice daily and has not had any issues since starting this med.  Working on trigger foods - especially fried/spicy  -Recommended to continue current medication Could consider step down in the future.  Recommend DEXA with PPI use.        Beverly Milch, PharmD Clinical Pharmacist  [facility] [phone]

## 2022-12-08 ENCOUNTER — Ambulatory Visit: Payer: Medicare HMO | Admitting: Sports Medicine

## 2022-12-08 ENCOUNTER — Encounter: Payer: Self-pay | Admitting: Sports Medicine

## 2022-12-08 DIAGNOSIS — M25551 Pain in right hip: Secondary | ICD-10-CM

## 2022-12-08 DIAGNOSIS — S76011S Strain of muscle, fascia and tendon of right hip, sequela: Secondary | ICD-10-CM | POA: Diagnosis not present

## 2022-12-08 NOTE — Progress Notes (Signed)
Danielle Rocha - 72 y.o. female MRN [id number]  Date of birth: 10/26/1950  Office Visit Note: Visit Date: 12/08/2022 PCP: Marin Olp, MD Referred by: Marin Olp, MD  Subjective: Chief Complaint  Patient presents with   Right Hip - Follow-up   HPI: Danielle Rocha is a pleasant 72 y.o. female who presents today for chronic left lateral hip pain.   She has had a chronic history of lateral hip pain.  She had a piriformis release back in 1998 or 1999.  She had a lumbar fusion to see if this would help relieve her pain.  Neither of these helped improve her pain.  She has had injections in the past as well. She is about 7 months s/p right hip gluteus medius repair by my partner, Dr. Sammuel Hines.   Have been doing extracorporeal shockwave therapy - feels like she is finding benefit from this. At this point, feels 50 % improved at times when this is doing well, other times has some exacerbation of her pain with busy days/activity and is less than this. Consistent ECSWT #4 performed today. Continues on Cymbalta medicine (will increase to next dose at end of this week), also taking Nitroglycerin 0.2mg  qd patch.  Pertinent ROS were reviewed with the patient and found to be negative unless otherwise specified above in HPI.   Assessment & Plan: Visit Diagnoses:  1. Tear of right gluteus medius tendon, sequela   2. Lateral pain of right hip    Plan: Danielle Rocha is making progress with the shockwave therapy and nitroglycerin patch protocol.  Reassuring today that she feels like when hip is doing well this feels 50% improved.  We will continue with weekly treatments and she will continue her nitroglycerin patch 0.21 mg changing every 24 hours.  She will continue uptitrating her Cymbalta per pscyh recommendation.  Starting Wednesday she will get back into her home hip stabilization and strengthening exercises.  Follow-up in 1 week.  Follow-up: Return in about 1 week (around 12/15/2022) for for  R-lateral hip pain.   Meds & Orders: No orders of the defined types were placed in this encounter.  No orders of the defined types were placed in this encounter.    Procedures: rocedure: ECSWT Indications:  Gluteus medius and greater trochanteric pain   Procedure Details Consent: Risks of procedure as well as the alternatives and risks of each were explained to the patient.  Verbal consent for procedure obtained. Time Out: Verified patient identification, verified procedure, site was marked, verified correct patient position. The area was cleaned with alcohol swab.     The right greater trochanteric region and gluteus medius tendon was targeted for Extracorporeal shockwave therapy.    Preset: Greater Trochanteric Bursitis Power Level: 120 mJ  Frequency: 12 Hz Impulse/cycles: 3200 Head size: Regular   Patient tolerated procedure well without immediate complications.      Clinical History:   She reports that she has never smoked. She has never used smokeless tobacco. No results for input(s): "HGBA1C", "LABURIC" in the last 8760 hours.  Objective:   Vital Signs: LMP  (LMP Unknown)   Physical Exam  Gen: Well-appearing, in no acute distress; non-toxic CV: Regular Rate. Well-perfused. Warm.  Resp: Breathing unlabored on room air; no wheezing. Psych: Fluid speech in conversation; appropriate affect; normal thought process Neuro: Sensation intact throughout. No gross coordination deficits.   Ortho Exam - Right hip: + TTP over the greater trochanteric and insertional gluteal tendons.  There is some mild TTP palpating  down the IT band.  Small degree of ecchymosis over the posterior lateral hip. There are 2 well-healed scars overlying the lateral hip without signs of infection.  No restriction to internal or external rotation about the hip. Neurovascularly intact distally.   Imaging: No results found.  Past Medical/Family/Surgical/Social History: Medications & Allergies reviewed  per EMR, new medications updated. Patient Active Problem List   Diagnosis Date Noted   Tear of right gluteus medius tendon    Sore in nose 07/30/2021   Involuntary movements 02/14/2020   Aortic atherosclerosis ([place]) 02/07/2020   Delayed gastric emptying 02/07/2020   Dystonia    Restless legs 07/27/2018   Recurrent UTI 10/07/2016   Major depression in full remission ([place]) 09/02/2016   GERD (gastroesophageal reflux disease) 01/30/2016   History of SI/intentional drug overdose 11/22/2015   Atrial fibrillation (HCC)    Chronic pain syndrome    Hyperlipidemia    Past Medical History:  Diagnosis Date   Anemia    Arthritis    DJD, low back, thumb   Atrial fibrillation (HCC)    Xarelto anticoagulation. Flecainide antiarrythmic    Chicken pox    Chronic pain syndrome    On disability. History of bilateral hip pain, low back pain. Gabapentin 400 BID, percocet 1 tablet daily per prior provider.    Colon polyp    awaiting records   Depression    zoloft 100mg , remeron 30mg  per psychiatry. ambien 10mg  per psychiatry to help wtih sleep element.    Dysrhythmia    Dystonia    described as psychogenic dystonia. Pain and twisting from upper chest and up with triggers "wind, creamy food" on TID ativan per psychiatry previously.    Fibromyalgia    GERD (gastroesophageal reflux disease)    omeprazole OTC   Goiter    states multiple imaging tests, has had biopsies   Hyperlipidemia    lovastatin 20mg    Stroke (HCC)    TIA (left side of face and body decreased sensitivity than right face and side)   TIA (transient ischemic attack)    Vaginal atrophy    estrace vaginal cream   Family History  Problem Relation Age of Onset   Alcohol abuse Mother        possible she had stomach cancer as well but unsure   Hypertension Father    Alcohol abuse Father    Pancreatic cancer Father    Cancer Brother        spindle cell RLE   Diabetes Brother    Cancer Brother        tonsil cancer   Skin  cancer Brother    Colon cancer Neg Hx    Colon polyps Neg Hx    Stomach cancer Neg Hx    Past Surgical History:  Procedure Laterality Date   ATRIAL FIBRILLATION ABLATION     BIOPSY THYROID     fusion l4-l5  09/22/1998   GLUTEUS MINIMUS REPAIR Right 05/19/2022   Procedure: RIGHT HIP GLUTEUS MEDIUS TENDON REPAIR;  Surgeon: Vanetta Mulders, MD;  Location: [facility];  Service: Orthopedics;  Laterality: Right;   LAMINECTOMY     L4-L5   OTHER SURGICAL HISTORY     tennis elbow surgery   piriformis release  09/22/1997   right hip   TONSILLECTOMY AND ADENOIDECTOMY  age 14   VAGINAL HYSTERECTOMY  09/23/1991   Social History   Occupational History   Occupation: retired  Tobacco Use   Smoking status: Never   Smokeless tobacco: Never  Vaping Use   Vaping Use: Never used  Substance and Sexual Activity   Alcohol use: No    Alcohol/week: 0.0 standard drinks of alcohol   Drug use: No   Sexual activity: Not Currently    Partners: Male

## 2022-12-08 NOTE — Progress Notes (Signed)
Doing better; feels like shockwave is helping  Did aggravate it on Saturday when she fell out of her husbands truck But overall a lot of improvement

## 2022-12-11 ENCOUNTER — Telehealth (INDEPENDENT_AMBULATORY_CARE_PROVIDER_SITE_OTHER): Payer: Medicare HMO | Admitting: Family

## 2022-12-11 ENCOUNTER — Encounter: Payer: Self-pay | Admitting: Family

## 2022-12-11 VITALS — BP 133/65 | Temp 99.6°F | Ht 68.0 in | Wt 147.0 lb

## 2022-12-11 DIAGNOSIS — U071 COVID-19: Secondary | ICD-10-CM

## 2022-12-11 MED ORDER — BENZONATATE 100 MG PO CAPS: 100.0000 mg | ORAL_CAPSULE | Freq: Three times a day (TID) | ORAL | 0 refills | Status: AC | PRN

## 2022-12-11 NOTE — Progress Notes (Signed)
MyChart Video Visit    Virtual Visit via Video Note   This format is felt to be most appropriate for this patient at this time. Physical exam was limited by quality of the video and audio technology used for the visit. CMA was able to get the patient set up on a video visit.  Patient location: Home. Patient and provider in visit Provider location: Office  I discussed the limitations of evaluation and management by telemedicine and the availability of in person appointments. The patient expressed understanding and agreed to proceed.  Visit Date: 12/11/2022  Today's healthcare provider: Jeanie Sewer, NP     Subjective:   Patient ID: Danielle Rocha, female    DOB: Jul 17, 1951, 72 y.o.   MRN: [id number]  Chief Complaint  Patient presents with   Covid Positive    Covid positive this morning. Pt c/o Congestion, Cough, body aches,fever of 99.6 and headache which started yesterday. Has tried tylenol, flonase and Dyquil/nyquil which did not help.     HPI URI sx:  Covid positive this morning. Pt c/o Congestion, Cough, body aches,fever of 99.6 and headache which started yesterday. Has tried tylenol, flonase and Dyquil/nyquil which did not help.   Assessment & Plan:  [id number] -  Sending Paxlovid pt advised  how to take, & SE. Advised of CDC guidelines for masking if out in public. OK to continue taking OTC sinus or pain meds. Encouraged to monitor & notify office of any worsening symptoms: increased shortness of breath, weakness, and signs of dehydration. Instructed to rest and hydrate well.    Past Medical History:  Diagnosis Date   Anemia    Arthritis    DJD, low back, thumb   Atrial fibrillation (HCC)    Xarelto anticoagulation. Flecainide antiarrythmic    Chicken pox    Chronic pain syndrome    On disability. History of bilateral hip pain, low back pain. Gabapentin 400 BID, percocet 1 tablet daily per prior provider.    Colon polyp    awaiting records   Depression     zoloft 100mg , remeron 30mg  per psychiatry. ambien 10mg  per psychiatry to help wtih sleep element.    Dysrhythmia    Dystonia    described as psychogenic dystonia. Pain and twisting from upper chest and up with triggers "wind, creamy food" on TID ativan per psychiatry previously.    Fibromyalgia    GERD (gastroesophageal reflux disease)    omeprazole OTC   Goiter    states multiple imaging tests, has had biopsies   Hyperlipidemia    lovastatin 20mg    Stroke (HCC)    TIA (left side of face and body decreased sensitivity than right face and side)   TIA (transient ischemic attack)    Vaginal atrophy    estrace vaginal cream    Past Surgical History:  Procedure Laterality Date   ATRIAL FIBRILLATION ABLATION     BIOPSY THYROID     fusion l4-l5  09/22/1998   GLUTEUS MINIMUS REPAIR Right 05/19/2022   Procedure: RIGHT HIP GLUTEUS MEDIUS TENDON REPAIR;  Surgeon: Vanetta Mulders, MD;  Location: [place];  Service: Orthopedics;  Laterality: Right;   LAMINECTOMY     L4-L5   OTHER SURGICAL HISTORY     tennis elbow surgery   piriformis release  09/22/1997   right hip   TONSILLECTOMY AND ADENOIDECTOMY  age 60   VAGINAL HYSTERECTOMY  09/23/1991    Outpatient Medications Prior to Visit  Medication Sig Dispense Refill   acetaminophen (TYLENOL)  500 MG tablet Take 1,000 mg by mouth every 6 (six) hours as needed for mild pain, moderate pain, fever or headache.     buPROPion (WELLBUTRIN XL) 150 MG 24 hr tablet Take 300 mg by mouth daily.     cephALEXin (KEFLEX) 250 MG capsule Take 250 mg by mouth daily.     Cholecalciferol (VITAMIN D) 125 MCG (5000 UT) CAPS Take 5,000 Units by mouth daily.     Coenzyme Q10 200 MG TABS Take 200 mg by mouth daily.     Cyanocobalamin (B-12 PO) Take 1 tablet by mouth daily.     DULoxetine (CYMBALTA) 60 MG capsule Take 60 mg by mouth daily.     Erenumab-aooe (AIMOVIG) 140 MG/ML SOAJ Inject 140 mg into the skin every 28 (twenty-eight) days. 1.12 mL 11   flecainide  (TAMBOCOR) 100 MG tablet Take 1 tablet by mouth twice daily 180 tablet 0   fluticasone (FLONASE) 50 MCG/ACT nasal spray Place 2 sprays into both nostrils daily. 16 g 3   Magnesium Oxide 420 MG TABS Take 420 mg by mouth daily.     melatonin 5 MG TABS Take 5 mg by mouth at bedtime.     Multiple Vitamin (MULTIVITAMIN WITH MINERALS) TABS tablet Take 1 tablet by mouth daily.     nitroGLYCERIN (NITRODUR - DOSED IN MG/24 HR) 0.2 mg/hr patch Place 1 patch (0.2 mg total) onto the skin daily. Cut the patch into 1/4ths --> place 1/4 patch over affected area, change every 24 hours 30 patch 1   ondansetron (ZOFRAN ODT) 4 MG disintegrating tablet Take 1 tablet (4 mg total) by mouth every 8 (eight) hours as needed for nausea or vomiting. 20 tablet 0   pantoprazole (PROTONIX) 20 MG tablet Take 1 tablet (20 mg total) by mouth 2 (two) times daily before a meal. 180 tablet 3   pyridOXINE (VITAMIN B6) 100 MG tablet Take 100 mg by mouth daily.     rosuvastatin (CRESTOR) 20 MG tablet Take 1 tablet by mouth once daily 90 tablet 0   traZODone (DESYREL) 50 MG tablet Take 1 tablet (50 mg total) by mouth at bedtime as needed for sleep. 30 tablet 0   XARELTO 20 MG TABS tablet TAKE 1 TABLET BY MOUTH ONCE DAILY WITH SUPPER 90 tablet 0   No facility-administered medications prior to visit.    Allergies  Allergen Reactions   Reglan [Metoclopramide] Other (See Comments)    Suicidal thoughts   Ciprofloxacin Diarrhea   Clindamycin/Lincomycin Diarrhea   Doxycycline Diarrhea and Nausea And Vomiting   Macrobid [Nitrofurantoin Monohyd Macro] Rash    Rash on lip   Sulfa Antibiotics Rash       Objective:   Physical Exam Vitals and nursing note reviewed.  Constitutional:      General: Pt is not in acute distress.    Appearance: Normal appearance.  HENT:     Head: Normocephalic.  Pulmonary:     Effort: No respiratory distress.  Musculoskeletal:     Cervical back: Normal range of motion.  Skin:    General: Skin is  dry.     Coloration: Skin is not pale.  Neurological:     Mental Status: Pt is alert and oriented to person, place, and time.  Psychiatric:        Mood and Affect: Mood normal.   BP 133/65 Comment: Pt reported  Temp 99.6 F (37.6 C) (Temporal)   Ht 5\' 8"  (1.727 m)   Wt 147 lb (66.7 kg)  LMP  (LMP Unknown)   BMI 22.35 kg/m   Wt Readings from Last 3 Encounters:  12/11/22 147 lb (66.7 kg)  11/21/22 148 lb 3.2 oz (67.2 kg)  11/03/22 150 lb (68 kg)     I discussed the assessment and treatment plan with the patient. The patient was provided an opportunity to ask questions and all were answered. The patient agreed with the plan and demonstrated an understanding of the instructions.   The patient was advised to call back or seek an in-person evaluation if the symptoms worsen or if the condition fails to improve as anticipated.  Jeanie Sewer, NP [place] [phone] (phone) [phone] (fax)  [place]

## 2022-12-17 ENCOUNTER — Encounter: Payer: Self-pay | Admitting: Sports Medicine

## 2022-12-22 ENCOUNTER — Encounter: Payer: Self-pay | Admitting: Sports Medicine

## 2022-12-22 ENCOUNTER — Ambulatory Visit: Payer: Medicare HMO | Admitting: Sports Medicine

## 2022-12-22 DIAGNOSIS — M25551 Pain in right hip: Secondary | ICD-10-CM | POA: Diagnosis not present

## 2022-12-22 DIAGNOSIS — S76011S Strain of muscle, fascia and tendon of right hip, sequela: Secondary | ICD-10-CM | POA: Diagnosis not present

## 2022-12-22 NOTE — Progress Notes (Signed)
Had some soreness on Saturday; but overall doing better Feels like these treatments are helping

## 2022-12-22 NOTE — Progress Notes (Signed)
Danielle Rocha - 71 y.o. female MRN [id number]  Date of birth: 1951-04-26  Office Visit Note: Visit Date: 12/22/2022 PCP: Marin Olp, MD Referred by: Marin Olp, MD  Subjective: Chief Complaint  Patient presents with   Right Hip - Follow-up   HPI: Danielle Rocha is a pleasant 72 y.o. female who presents today for follow-up of right lateral hip pain.   She has had a chronic history of lateral hip pain.  She had a piriformis release back in 1998 or 1999.  She had a lumbar fusion to see if this would help relieve her pain.  Neither of these helped improve her pain.  She has had injections in the past as well. She is about 7 months s/p right hip gluteus medius repair by my partner, Dr. Sammuel Hines.  Continues to find improvement with extracorporeal shockwave therapy.  Has some days where she has no pain and is able to do activity, other days that it is painful.  She continues on Cymbalta as well as taking nitroglycerin patch qd.   Today is consistent ECSWT #5 today.  Pertinent ROS were reviewed with the patient and found to be negative unless otherwise specified above in HPI.   Assessment & Plan: Visit Diagnoses:  1. Tear of right gluteus medius tendon, sequela   2. Lateral pain of right hip    Plan: Saheli continues to make progress with extracorporeal shockwave therapy and her nitroglycerin patch protocol.  We did repeat his ESWT today, tolerated well.  She will continue with nitroglycerin patch 0.2 mg changing every 24 hours.  Continue Cymbalta per psych recommendation.  I would like her to continue her home hip stabilization and strengthening exercises.  Follow-up in 1 week.  Will plan for about 2-3 more treatments and may take a brief holiday to see long-term improvement.  Follow-up: Return in about 1 week (around 12/29/2022) for for lat hip pain.   Meds & Orders: No orders of the defined types were placed in this encounter.  No orders of the defined types were placed in  this encounter.    Procedures:  Procedure: ECSWT Indications:  Gluteus medius and greater trochanteric pain   Procedure Details Consent: Risks of procedure as well as the alternatives and risks of each were explained to the patient.  Verbal consent for procedure obtained. Time Out: Verified patient identification, verified procedure, site was marked, verified correct patient position. The area was cleaned with alcohol swab.     The right greater trochanteric region and gluteus medius tendon was targeted for Extracorporeal shockwave therapy.    Preset: Greater Trochanteric Bursitis Power Level: 120 mJ  Frequency: 12 Hz Impulse/cycles: 3200 Head size: Regular   Patient tolerated procedure well without immediate complications.       Clinical History:   She reports that she has never smoked. She has never used smokeless tobacco. No results for input(s): "HGBA1C", "LABURIC" in the last 8760 hours.  Objective:   Vital Signs: LMP  (LMP Unknown)   Physical Exam  Gen: Well-appearing, in no acute distress; non-toxic CV: Regular Rate. Well-perfused. Warm.  Resp: Breathing unlabored on room air; no wheezing. Psych: Fluid speech in conversation; appropriate affect; normal thought process Neuro: Sensation intact throughout. No gross coordination deficits.   Ortho Exam - Right hip: + TTP over the greater trochanteric and insertional gluteal tendons.  There is some mild TTP palpating down the IT band.  Small degree of ecchymosis over the posterior lateral hip. There are 2 well-healed  scars overlying the lateral hip without signs of infection.  No restriction to internal or external rotation about the hip. Neurovascularly intact distally.   Imaging: No results found.  Past Medical/Family/Surgical/Social History: Medications & Allergies reviewed per EMR, new medications updated. Patient Active Problem List   Diagnosis Date Noted   Tear of right gluteus medius tendon    Involuntary  movements 02/14/2020   Aortic atherosclerosis 02/07/2020   Delayed gastric emptying 02/07/2020   Dystonia    Restless legs 07/27/2018   Recurrent UTI 10/07/2016   Major depression in full remission 09/02/2016   GERD (gastroesophageal reflux disease) 01/30/2016   History of SI/intentional drug overdose 11/22/2015   Atrial fibrillation    Chronic pain syndrome    Hyperlipidemia    Past Medical History:  Diagnosis Date   Anemia    Arthritis    DJD, low back, thumb   Atrial fibrillation (HCC)    Xarelto anticoagulation. Flecainide antiarrythmic    Chicken pox    Chronic pain syndrome    On disability. History of bilateral hip pain, low back pain. Gabapentin 400 BID, percocet 1 tablet daily per prior provider.    Colon polyp    awaiting records   Depression    zoloft 100mg , remeron 30mg  per psychiatry. ambien 10mg  per psychiatry to help wtih sleep element.    Dysrhythmia    Dystonia    described as psychogenic dystonia. Pain and twisting from upper chest and up with triggers "wind, creamy food" on TID ativan per psychiatry previously.    Fibromyalgia    GERD (gastroesophageal reflux disease)    omeprazole OTC   Goiter    states multiple imaging tests, has had biopsies   Hyperlipidemia    lovastatin 20mg    Stroke (HCC)    TIA (left side of face and body decreased sensitivity than right face and side)   TIA (transient ischemic attack)    Vaginal atrophy    estrace vaginal cream   Family History  Problem Relation Age of Onset   Alcohol abuse Mother        possible she had stomach cancer as well but unsure   Hypertension Father    Alcohol abuse Father    Pancreatic cancer Father    Cancer Brother        spindle cell RLE   Diabetes Brother    Cancer Brother        tonsil cancer   Skin cancer Brother    Colon cancer Neg Hx    Colon polyps Neg Hx    Stomach cancer Neg Hx    Past Surgical History:  Procedure Laterality Date   ATRIAL FIBRILLATION ABLATION     BIOPSY  THYROID     fusion l4-l5  09/22/1998   GLUTEUS MINIMUS REPAIR Right 05/19/2022   Procedure: RIGHT HIP GLUTEUS MEDIUS TENDON REPAIR;  Surgeon: Vanetta Mulders, MD;  Location: [place];  Service: Orthopedics;  Laterality: Right;   LAMINECTOMY     L4-L5   OTHER SURGICAL HISTORY     tennis elbow surgery   piriformis release  09/22/1997   right hip   TONSILLECTOMY AND ADENOIDECTOMY  age 31   VAGINAL HYSTERECTOMY  09/23/1991   Social History   Occupational History   Occupation: retired  Tobacco Use   Smoking status: Never   Smokeless tobacco: Never  Vaping Use   Vaping Use: Never used  Substance and Sexual Activity   Alcohol use: No    Alcohol/week: 0.0 standard drinks of  alcohol   Drug use: No   Sexual activity: Not Currently    Partners: Male

## 2022-12-24 ENCOUNTER — Telehealth: Payer: Self-pay | Admitting: Anesthesiology

## 2022-12-24 NOTE — Telephone Encounter (Signed)
Pt called stating her [employer] needs a PA.

## 2022-12-29 ENCOUNTER — Other Ambulatory Visit: Payer: Self-pay | Admitting: Family Medicine

## 2022-12-29 ENCOUNTER — Ambulatory Visit: Payer: Medicare HMO | Admitting: Sports Medicine

## 2022-12-29 ENCOUNTER — Encounter: Payer: Self-pay | Admitting: Sports Medicine

## 2022-12-29 DIAGNOSIS — S76011S Strain of muscle, fascia and tendon of right hip, sequela: Secondary | ICD-10-CM | POA: Diagnosis not present

## 2022-12-29 DIAGNOSIS — M25551 Pain in right hip: Secondary | ICD-10-CM | POA: Diagnosis not present

## 2022-12-29 NOTE — Progress Notes (Signed)
Danielle Rocha - 72 y.o. female MRN [id number]  Date of birth: 15-Jul-1951  Office Visit Note: Visit Date: 12/29/2022 PCP: Shelva Majestic, MD Referred by: Shelva Majestic, MD  Subjective: Chief Complaint  Patient presents with   Right Hip - Follow-up   HPI: Danielle Rocha is a pleasant 72 y.o. female who presents today for follow-up of right lateral hip pain.    She has had a chronic history of lateral hip pain.  She had a piriformis release back in 1998 or 1999.  She had a lumbar fusion to see if this would help relieve her pain.  Neither of these helped improve her pain.  She has had injections in the past as well. She is about 7-8 months s/p right hip gluteus medius repair by my partner, Dr. Steward Drone.  We have been proceeding with ECSWT, finding improvements. She is also on the nitroglycerin patch 0.2mg  qd protocol. Today, she is doing well, continues to find benefit in pain relief and function.  Today is consistent ECSWT #6 today.   Pertinent ROS were reviewed with the patient and found to be negative unless otherwise specified above in HPI.   Assessment & Plan: Visit Diagnoses:  1. Tear of right gluteus medius tendon, sequela   2. Lateral pain of right hip    Plan: Quana continues making good progress with use ESWT and nitroglycerin patch protocol.  She is very pleased that we have from treatment modalities that have given her relief and improved her function.  I would like to follow-up with her next week for an additional shockwave treatment, after that week we will take a brief holiday for about 2-3 weeks to see if she continues to get further improvement.  She will continue her home hip stabilization and strengthening exercises.  Continue nitroglycerin patch and her Cymbalta per psych recommendation. F/u in 1 week.   We will plan for nitroglycerin patch somewhere between 10-12 weeks from start.  Follow-up: Return in about 1 week (around 01/05/2023).   Meds & Orders:  No orders of the defined types were placed in this encounter.  No orders of the defined types were placed in this encounter.    Procedures: Procedure: ECSWT Indications:  Gluteus medius and greater trochanteric pain   Procedure Details Consent: Risks of procedure as well as the alternatives and risks of each were explained to the patient.  Verbal consent for procedure obtained. Time Out: Verified patient identification, verified procedure, site was marked, verified correct patient position. The area was cleaned with alcohol swab.     The right greater trochanteric region and gluteus medius tendon was targeted for Extracorporeal shockwave therapy.    Preset: Greater Trochanteric Bursitis Power Level: 120 mJ   Frequency: 12 Hz Impulse/cycles: 3300 Head size: Regular   Patient tolerated procedure well without immediate complications.       Clinical History:   She reports that she has never smoked. She has never used smokeless tobacco. No results for input(s): "HGBA1C", "LABURIC" in the last 8760 hours.  Objective:   Vital Signs: LMP  (LMP Unknown)   Physical Exam  Gen: Well-appearing, in no acute distress; non-toxic CV: Regular Rate. Well-perfused. Warm.  Resp: Breathing unlabored on room air; no wheezing. Psych: Fluid speech in conversation; appropriate affect; normal thought process Neuro: Sensation intact throughout. No gross coordination deficits.   Ortho Exam - Right hip: + mild TTP over the greater trochanteric and insertional gluteal tendons.   There are 2 well-healed scars overlying  the lateral hip without signs of infection.  No restriction to internal or external rotation about the hip. Neurovascularly intact distally.   Imaging: No results found.  Past Medical/Family/Surgical/Social History: Medications & Allergies reviewed per EMR, new medications updated. Patient Active Problem List   Diagnosis Date Noted   Tear of right gluteus medius tendon    Involuntary  movements 02/14/2020   Aortic atherosclerosis 02/07/2020   Delayed gastric emptying 02/07/2020   Dystonia    Restless legs 07/27/2018   Recurrent UTI 10/07/2016   Major depression in full remission 09/02/2016   GERD (gastroesophageal reflux disease) 01/30/2016   History of SI/intentional drug overdose 11/22/2015   Atrial fibrillation    Chronic pain syndrome    Hyperlipidemia    Past Medical History:  Diagnosis Date   Anemia    Arthritis    DJD, low back, thumb   Atrial fibrillation    Xarelto anticoagulation. Flecainide antiarrythmic    Chicken pox    Chronic pain syndrome    On disability. History of bilateral hip pain, low back pain. Gabapentin 400 BID, percocet 1 tablet daily per prior provider.    Colon polyp    awaiting records   Depression    zoloft 100mg , remeron 30mg  per psychiatry. ambien 10mg  per psychiatry to help wtih sleep element.    Dysrhythmia    Dystonia    described as psychogenic dystonia. Pain and twisting from upper chest and up with triggers "wind, creamy food" on TID ativan per psychiatry previously.    Fibromyalgia    GERD (gastroesophageal reflux disease)    omeprazole OTC   Goiter    states multiple imaging tests, has had biopsies   Hyperlipidemia    lovastatin 20mg    Stroke    TIA (left side of face and body decreased sensitivity than right face and side)   TIA (transient ischemic attack)    Vaginal atrophy    estrace vaginal cream   Family History  Problem Relation Age of Onset   Alcohol abuse Mother        possible she had stomach cancer as well but unsure   Hypertension Father    Alcohol abuse Father    Pancreatic cancer Father    Cancer Brother        spindle cell RLE   Diabetes Brother    Cancer Brother        tonsil cancer   Skin cancer Brother    Colon cancer Neg Hx    Colon polyps Neg Hx    Stomach cancer Neg Hx    Past Surgical History:  Procedure Laterality Date   ATRIAL FIBRILLATION ABLATION     BIOPSY THYROID      fusion l4-l5  09/22/1998   GLUTEUS MINIMUS REPAIR Right 05/19/2022   Procedure: RIGHT HIP GLUTEUS MEDIUS TENDON REPAIR;  Surgeon: Huel Cote, MD;  Location: [facility];  Service: Orthopedics;  Laterality: Right;   LAMINECTOMY     L4-L5   OTHER SURGICAL HISTORY     tennis elbow surgery   piriformis release  09/22/1997   right hip   TONSILLECTOMY AND ADENOIDECTOMY  age 84   VAGINAL HYSTERECTOMY  09/23/1991   Social History   Occupational History   Occupation: retired  Tobacco Use   Smoking status: Never   Smokeless tobacco: Never  Vaping Use   Vaping Use: Never used  Substance and Sexual Activity   Alcohol use: No    Alcohol/week: 0.0 standard drinks of alcohol   Drug  use: No   Sexual activity: Not Currently    Partners: Male

## 2022-12-29 NOTE — Progress Notes (Signed)
She stated she is feeling better. She did have one day she was feeling bad but overall she is doing really good.

## 2023-01-01 ENCOUNTER — Ambulatory Visit (INDEPENDENT_AMBULATORY_CARE_PROVIDER_SITE_OTHER): Payer: Medicare HMO | Admitting: Family Medicine

## 2023-01-01 VITALS — BP 120/68 | HR 68 | Temp 97.1°F | Ht 68.0 in | Wt 149.4 lb

## 2023-01-01 DIAGNOSIS — R053 Chronic cough: Secondary | ICD-10-CM

## 2023-01-01 DIAGNOSIS — J301 Allergic rhinitis due to pollen: Secondary | ICD-10-CM | POA: Diagnosis not present

## 2023-01-01 DIAGNOSIS — G249 Dystonia, unspecified: Secondary | ICD-10-CM

## 2023-01-01 MED ORDER — LORAZEPAM 0.5 MG PO TABS: 0.5000 mg | ORAL_TABLET | Freq: Every day | ORAL | 0 refills | Status: AC | PRN

## 2023-01-01 NOTE — Patient Instructions (Addendum)
We will either call you or see alternate belowwithin two weeks about your referral to [facility] . Our referral specialist will sometimes also send you a mychart link once referral is approved and then you will call the # listed on there (let us know if you do not see this within 2 weeks or have not received call)  Short term ativan -try to use very sparingly. If you develop any down mood or depression please let us or pscyhiatry know as soon as possible - just want to be cautious with history  Recommended follow up: Return in about 5 months (around 06/03/2023) for physical or sooner if needed.Schedule b4 you leave.

## 2023-01-01 NOTE — Progress Notes (Signed)
Phone [phone] In person visit   Subjective:   Danielle Rocha is a 72 y.o. year old very pleasant female patient who presents for/with See problem oriented charting Chief Complaint  Patient presents with   Follow-up    Pt is here to f/u regarding referral due to dystonia returning.   Past Medical History-  Patient Active Problem List   Diagnosis Date Noted   Dystonia     Priority: High   Major depression in full remission 09/02/2016    Priority: High   History of SI/intentional drug overdose 11/22/2015    Priority: High   Atrial fibrillation     Priority: High   Chronic pain syndrome     Priority: High   Seasonal allergic rhinitis due to pollen 01/01/2023    Priority: Medium    Aortic atherosclerosis 02/07/2020    Priority: Medium    Delayed gastric emptying 02/07/2020    Priority: Medium    Restless legs 07/27/2018    Priority: Medium    Recurrent UTI 10/07/2016    Priority: Medium    Hyperlipidemia     Priority: Medium    GERD (gastroesophageal reflux disease) 01/30/2016    Priority: Low   Tear of right gluteus medius tendon    Involuntary movements 02/14/2020    Medications- reviewed and updated Current Outpatient Medications  Medication Sig Dispense Refill   acetaminophen (TYLENOL) 500 MG tablet Take 1,000 mg by mouth every 6 (six) hours as needed for mild pain, moderate pain, fever or headache.     buPROPion (WELLBUTRIN XL) 150 MG 24 hr tablet Take 300 mg by mouth daily.     cephALEXin (KEFLEX) 250 MG capsule Take 250 mg by mouth daily.     Cholecalciferol (VITAMIN D) 125 MCG (5000 UT) CAPS Take 5,000 Units by mouth daily.     Coenzyme Q10 200 MG TABS Take 200 mg by mouth daily.     Cyanocobalamin (B-12 PO) Take 1 tablet by mouth daily.     DULoxetine (CYMBALTA) 60 MG capsule Take 60 mg by mouth daily.     Erenumab-aooe (AIMOVIG) 140 MG/ML SOAJ Inject 140 mg into the skin every 28 (twenty-eight) days. 1.12 mL 11   flecainide (TAMBOCOR) 100 MG tablet  Take 1 tablet by mouth twice daily 180 tablet 0   fluticasone (FLONASE) 50 MCG/ACT nasal spray Place 2 sprays into both nostrils daily. 16 g 3   LORazepam (ATIVAN) 0.5 MG tablet Take 1 tablet (0.5 mg total) by mouth daily as needed for anxiety (and dystonia). 10 tablet 0   Magnesium Oxide 420 MG TABS Take 420 mg by mouth daily.     melatonin 5 MG TABS Take 5 mg by mouth at bedtime.     Multiple Vitamin (MULTIVITAMIN WITH MINERALS) TABS tablet Take 1 tablet by mouth daily.     pantoprazole (PROTONIX) 20 MG tablet Take 1 tablet (20 mg total) by mouth 2 (two) times daily before a meal. 180 tablet 3   pyridOXINE (VITAMIN B6) 100 MG tablet Take 100 mg by mouth daily.     rosuvastatin (CRESTOR) 20 MG tablet Take 1 tablet by mouth once daily 90 tablet 0   traZODone (DESYREL) 50 MG tablet TAKE 1 TABLET BY MOUTH AT BEDTIME AS NEEDED FOR SLEEP 30 tablet 0   XARELTO 20 MG TABS tablet TAKE 1 TABLET BY MOUTH ONCE DAILY WITH SUPPER 90 tablet 0   nitroGLYCERIN (NITRODUR - DOSED IN MG/24 HR) 0.2 mg/hr patch Place 1 patch (0.2 mg total)  onto the skin daily. Cut the patch into 1/4ths --> place 1/4 patch over affected area, change every 24 hours 30 patch 1   ondansetron (ZOFRAN ODT) 4 MG disintegrating tablet Take 1 tablet (4 mg total) by mouth every 8 (eight) hours as needed for nausea or vomiting. (Patient not taking: Reported on 01/01/2023) 20 tablet 0   No current facility-administered medications for this visit.     Objective:  BP 120/68   Pulse 68   Temp (!) 97.1 F (36.2 C)   Ht 5\' 8"  (1.727 m)   Wt 149 lb 6.4 oz (67.8 kg)   LMP  (LMP Unknown)   SpO2 97%   BMI 22.72 kg/m  Gen: NAD, resting comfortably CV: Regular heart rate Lungs: CTAB no crackles, wheeze, rhonchi Ext: no edema Skin: warm, dry Neuro: Shaking of neck and head more significant than tremor throughout visit    Assessment and Plan   #Dystonia S: had been doing better for about a year  as of our march visit but in last month has  noted worsening- actually just started on Monday. Sister with parkinson's has been [facility] and they have a lot of experience with movement disorders and has found it very helpful. Presents with tension in neck that produces a shaking of the head and neck and become very painful- pain 2-3 today but just started on longer periods of this gets up to 6-7/10. Heat helps some and tries to hold   -has worked with Dr. Terrace Arabia in the past. Sinemet was not helpful. Propranolol caused low blood pressure. In past had used ativan and at first helped but later thought not helpful and stopped.  A/P: Dystonia (previously thought to be psychogenic with partial response to Ativan) worsening since Monday-she would like a referral to [facility]-I think this is very reasonable and referral was placed - She does have a prior suicide attempt with Ativan but her depression has been very well-controlled and she is under active management with psychiatry now.  Also prior concern with Ativan was that she was also on narcotics-no longer on narcotics - She would like to retry Ativan but we opted to limit to a very short supply #10 to use perhaps only if pain gets above 5 out of 10-she is in agreement and will let us know if any worsening depression  #Chronic cough- see note 11/21/22. She has seen improvement on antihistamine before bed and doing Flonase but still persists- we discussed referral to pulmonary but she wants to focus on settling dystonia first.  -Seasonal allergies-appear to be improved-continue current medication but once again she will reach out about pulmonology referral at a later date  Recommended follow up: Return in about 5 months (around 06/03/2023) for physical or sooner if needed.Schedule b4 you leave. Future Appointments  Date Time Provider Department Center  01/14/2023 11:00 AM Terri Piedra, MD [facility] None  02/11/2023  9:00 AM Marcos Eke, PA-C [facility] None  06/08/2023 12:00  PM Erroll Luna, [place] [facility] None  06/16/2023  9:00 AM Shelva Majestic, MD [facility]    Lab/Order associations:   ICD-10-CM   1. Dystonia  G24.9 Ambulatory referral to Occupational Therapy    2. Seasonal allergic rhinitis due to pollen  J30.1     3. Chronic cough  R05.3       Meds ordered this encounter  Medications   LORazepam (ATIVAN) 0.5 MG tablet    Sig: Take 1 tablet (0.5  mg total) by mouth daily as needed for anxiety (and dystonia).    Dispense:  10 tablet    Refill:  0    Return precautions advised.  Tana ConchStephen Akeylah Hendel, MD

## 2023-01-05 ENCOUNTER — Encounter: Payer: Self-pay | Admitting: Family Medicine

## 2023-01-05 ENCOUNTER — Encounter: Payer: Self-pay | Admitting: Sports Medicine

## 2023-01-05 ENCOUNTER — Ambulatory Visit: Payer: Medicare HMO | Admitting: Sports Medicine

## 2023-01-05 ENCOUNTER — Encounter: Payer: Self-pay | Admitting: Physician Assistant

## 2023-01-05 DIAGNOSIS — M25551 Pain in right hip: Secondary | ICD-10-CM | POA: Diagnosis not present

## 2023-01-05 DIAGNOSIS — S76011S Strain of muscle, fascia and tendon of right hip, sequela: Secondary | ICD-10-CM

## 2023-01-05 NOTE — Progress Notes (Signed)
Danielle Rocha - 72 y.o. female MRN [id number]  Date of birth: 10/31/1950  Office Visit Note: Visit Date: 01/05/2023 PCP: Shelva Majestic, MD Referred by: Shelva Majestic, MD  Subjective: Chief Complaint  Patient presents with   Right Hip - Follow-up   HPI: Danielle Rocha is a pleasant 72 y.o. female who presents today for follow-up of right lateral hip pain.  History of chronic lateral hip pain with prior piriformis release in status post right gluteus medius repair about 8 months ago.  Has been doing use ESWT, nitroglycerin patch, home therapy with excellent relief of her pain. She is able to do much more activity with pain limiting her.  Today is consistent ECSWT #7 today.   Pertinent ROS were reviewed with the patient and found to be negative unless otherwise specified above in HPI.   Assessment & Plan: Visit Diagnoses:  1. Tear of right gluteus medius tendon, sequela   2. Lateral pain of right hip    Plan: Both Shakita and I are very pleased with the progress and pain relief she has received from our treatment modalities.  We did repeat extracorporeal shockwave therapy today.  We will take a 3-week holiday and see what sort of relief she gets with chemotherapy effect.  She is also going on a trip until early May, she will follow-up after this.  She will continue her nitroglycerin patch 0.2 mg/h changing once daily.  At her follow-up appointment it will be near the 12-week mark and we will likely discontinue this depending on how she is doing.  She will continue her home hip stabilization and strengthening exercises.  Follow-up in 3 weeks.  Follow-up: Return in about 3 weeks (around 01/26/2023) for for lateral hip.   Meds & Orders: No orders of the defined types were placed in this encounter.  No orders of the defined types were placed in this encounter.    Procedures: Procedure: ECSWT Indications:  Gluteus medius and greater trochanteric pain   Procedure  Details Consent: Risks of procedure as well as the alternatives and risks of each were explained to the patient.  Verbal consent for procedure obtained. Time Out: Verified patient identification, verified procedure, site was marked, verified correct patient position. The area was cleaned with alcohol swab.     The right greater trochanteric region and gluteus medius tendon was targeted for Extracorporeal shockwave therapy.    Preset: Greater Trochanteric Bursitis Power Level: 120 mJ   Frequency: 12 Hz Impulse/cycles: 3500 Head size: Regular   Patient tolerated procedure well without immediate complications.        Clinical History:   She reports that she has never smoked. She has never used smokeless tobacco. No results for input(s): "HGBA1C", "LABURIC" in the last 8760 hours.  Objective:   Vital Signs: LMP  (LMP Unknown)   Physical Exam  Gen: Well-appearing, in no acute distress; non-toxic CV: Well-perfused. Warm.  Resp: Breathing unlabored on room air; no wheezing. Psych: Fluid speech in conversation; appropriate affect; normal thought process Neuro: Sensation intact throughout. No gross coordination deficits.   Ortho Exam - Right hip: + mild TTP over the greater trochanteric and insertional gluteal tendons.   There are 2 well-healed scars overlying the lateral hip without signs of infection.  No restriction to internal or external rotation about the hip. Neurovascularly intact distally.   Imaging: No results found.  Past Medical/Family/Surgical/Social History: Medications & Allergies reviewed per EMR, new medications updated. Patient Active Problem List   Diagnosis  Date Noted   Seasonal allergic rhinitis due to pollen 01/01/2023   Tear of right gluteus medius tendon    Involuntary movements 02/14/2020   Aortic atherosclerosis 02/07/2020   Delayed gastric emptying 02/07/2020   Dystonia    Restless legs 07/27/2018   Recurrent UTI 10/07/2016   Major depression in full  remission 09/02/2016   GERD (gastroesophageal reflux disease) 01/30/2016   History of SI/intentional drug overdose 11/22/2015   Atrial fibrillation    Chronic pain syndrome    Hyperlipidemia    Past Medical History:  Diagnosis Date   Anemia    Arthritis    DJD, low back, thumb   Atrial fibrillation    Xarelto anticoagulation. Flecainide antiarrythmic    Chicken pox    Chronic pain syndrome    On disability. History of bilateral hip pain, low back pain. Gabapentin 400 BID, percocet 1 tablet daily per prior provider.    Colon polyp    awaiting records   Depression    zoloft 100mg , remeron 30mg  per psychiatry. ambien 10mg  per psychiatry to help wtih sleep element.    Dysrhythmia    Dystonia    described as psychogenic dystonia. Pain and twisting from upper chest and up with triggers "wind, creamy food" on TID ativan per psychiatry previously.    Fibromyalgia    GERD (gastroesophageal reflux disease)    omeprazole OTC   Goiter    states multiple imaging tests, has had biopsies   Hyperlipidemia    lovastatin 20mg    Stroke    TIA (left side of face and body decreased sensitivity than right face and side)   TIA (transient ischemic attack)    Vaginal atrophy    estrace vaginal cream   Family History  Problem Relation Age of Onset   Alcohol abuse Mother        possible she had stomach cancer as well but unsure   Hypertension Father    Alcohol abuse Father    Pancreatic cancer Father    Cancer Brother        spindle cell RLE   Diabetes Brother    Cancer Brother        tonsil cancer   Skin cancer Brother    Colon cancer Neg Hx    Colon polyps Neg Hx    Stomach cancer Neg Hx    Past Surgical History:  Procedure Laterality Date   ATRIAL FIBRILLATION ABLATION     BIOPSY THYROID     fusion l4-l5  09/22/1998   GLUTEUS MINIMUS REPAIR Right 05/19/2022   Procedure: RIGHT HIP GLUTEUS MEDIUS TENDON REPAIR;  Surgeon: Huel Cote, MD;  Location: [facility] OR;  Service: Orthopedics;   Laterality: Right;   LAMINECTOMY     L4-L5   OTHER SURGICAL HISTORY     tennis elbow surgery   piriformis release  09/22/1997   right hip   TONSILLECTOMY AND ADENOIDECTOMY  age 10   VAGINAL HYSTERECTOMY  09/23/1991   Social History   Occupational History   Occupation: retired  Tobacco Use   Smoking status: Never   Smokeless tobacco: Never  Vaping Use   Vaping Use: Never used  Substance and Sexual Activity   Alcohol use: No    Alcohol/week: 0.0 standard drinks of alcohol   Drug use: No   Sexual activity: Not Currently    Partners: Male

## 2023-01-05 NOTE — Progress Notes (Signed)
Doing a lot better;states the shockwave is definitely helping  Is a little sore from overdoing it yesterday with exercises.

## 2023-01-06 ENCOUNTER — Other Ambulatory Visit (HOSPITAL_COMMUNITY): Payer: Self-pay

## 2023-01-06 ENCOUNTER — Telehealth: Payer: Self-pay | Admitting: Pharmacy Technician

## 2023-01-06 ENCOUNTER — Telehealth: Payer: Self-pay | Admitting: Pharmacist

## 2023-01-06 NOTE — Telephone Encounter (Signed)
Patient Advocate Encounter  Received notification from [facility] that prior authorization for AIMOVIG  is required.   PA submitted on 4.16.24 Key [id number]  Status is pending

## 2023-01-06 NOTE — Progress Notes (Signed)
Care Management & Coordination Services Pharmacy Team  Reason for Encounter: General adherence update   Contacted patient for general health update and medication adherence call.  Spoke with patient on 01/06/2023    What concerns do you have about your medications? None  The patient reports side effects with their medications. Patient states she stopped taking mybetriq due to blisters around her mouth.  How often do you forget or accidentally miss a dose? Rarely  Do you use a pillbox? Yes  Are you having any problems getting your medications from your pharmacy? No  Has the cost of your medications been a concern? No If yes, what medication and is patient assistance available or has it been applied for?  Since last visit with PharmD, no interventions have been made.   The patient has not had an ED visit since last contact.   The patient denies problems with their health.   Patient denies concerns or questions for Erskine Emery, PharmD at this time.   Counseled patient on: Access to carecoordination team for any cost, medication or pharmacy concerns.  -Patient states she has noticed that cymbalta is not helping as much as the wellbutrin for depression.  -Patient states her blood pressure has been doing great 126/78.  Chart Updates:  Recent office visits:  01/01/2023 OV (PCP) Shelva Majestic, MD; She would like to retry Ativan but we opted to limit to a very short supply #10 to use perhaps only if pain gets above 5 out of 1   Recent consult visits:  01/05/2023 [facility] (Orthopedics) Madelyn Brunner, DO; She will continue her nitroglycerin patch 0.2 mg/h changing once daily. At her follow-up appointment it will be near the 12-week mark and we will likely discontinue this depending on how she is doing.   12/29/2022 [facility] (Orthopedics) Madelyn Brunner, DO; Continue nitroglycerin patch and her Cymbalta per psych recommendation. F/u in 1 week.   12/22/2022 [facility] (Orthopedics) Madelyn Brunner, DO; no  medication changes indicated.  12/08/2022 [facility] (Orthopedics) Madelyn Brunner, DO; no medication changes indicated.  Hospital visits:  None in previous 6 months  Medications: Outpatient Encounter Medications as of 01/06/2023  Medication Sig Note   acetaminophen (TYLENOL) 500 MG tablet Take 1,000 mg by mouth every 6 (six) hours as needed for mild pain, moderate pain, fever or headache.    buPROPion (WELLBUTRIN XL) 150 MG 24 hr tablet Take 300 mg by mouth daily.    cephALEXin (KEFLEX) 250 MG capsule Take 250 mg by mouth daily. 05/14/2022: Continuous therapy   Cholecalciferol (VITAMIN D) 125 MCG (5000 UT) CAPS Take 5,000 Units by mouth daily.    Coenzyme Q10 200 MG TABS Take 200 mg by mouth daily.    Cyanocobalamin (B-12 PO) Take 1 tablet by mouth daily.    DULoxetine (CYMBALTA) 60 MG capsule Take 60 mg by mouth daily.    Erenumab-aooe (AIMOVIG) 140 MG/ML SOAJ Inject 140 mg into the skin every 28 (twenty-eight) days.    flecainide (TAMBOCOR) 100 MG tablet Take 1 tablet by mouth twice daily    fluticasone (FLONASE) 50 MCG/ACT nasal spray Place 2 sprays into both nostrils daily.    LORazepam (ATIVAN) 0.5 MG tablet Take 1 tablet (0.5 mg total) by mouth daily as needed for anxiety (and dystonia).    Magnesium Oxide 420 MG TABS Take 420 mg by mouth daily.    melatonin 5 MG TABS Take 5 mg by mouth at bedtime.    Multiple Vitamin (MULTIVITAMIN WITH MINERALS) TABS tablet Take 1 tablet  by mouth daily.    nitroGLYCERIN (NITRODUR - DOSED IN MG/24 HR) 0.2 mg/hr patch Place 1 patch (0.2 mg total) onto the skin daily. Cut the patch into 1/4ths --> place 1/4 patch over affected area, change every 24 hours    ondansetron (ZOFRAN ODT) 4 MG disintegrating tablet Take 1 tablet (4 mg total) by mouth every 8 (eight) hours as needed for nausea or vomiting. (Patient not taking: Reported on 01/01/2023)    pantoprazole (PROTONIX) 20 MG tablet Take 1 tablet (20 mg total) by mouth 2 (two) times daily before a meal.    pyridOXINE  (VITAMIN B6) 100 MG tablet Take 100 mg by mouth daily.    rosuvastatin (CRESTOR) 20 MG tablet Take 1 tablet by mouth once daily    traZODone (DESYREL) 50 MG tablet TAKE 1 TABLET BY MOUTH AT BEDTIME AS NEEDED FOR SLEEP    XARELTO 20 MG TABS tablet TAKE 1 TABLET BY MOUTH ONCE DAILY WITH SUPPER    No facility-administered encounter medications on file as of 01/06/2023.    Recent vitals BP Readings from Last 3 Encounters:  01/01/23 120/68  12/11/22 133/65  11/21/22 110/64   Pulse Readings from Last 3 Encounters:  01/01/23 68  11/21/22 64  11/03/22 61   Wt Readings from Last 3 Encounters:  01/01/23 149 lb 6.4 oz (67.8 kg)  12/11/22 147 lb (66.7 kg)  11/21/22 148 lb 3.2 oz (67.2 kg)   BMI Readings from Last 3 Encounters:  01/01/23 22.72 kg/m  12/11/22 22.35 kg/m  11/21/22 22.53 kg/m    Recent lab results    Component Value Date/Time   NA 143 11/25/2022 0943   K 3.9 11/25/2022 0943   CL 105 11/25/2022 0943   CO2 30 11/25/2022 0943   GLUCOSE 96 11/25/2022 0943   BUN 19 11/25/2022 0943   BUN 19 03/19/2016 0000   CREATININE 0.84 11/25/2022 0943   CALCIUM 9.8 11/25/2022 0943    Lab Results  Component Value Date   CREATININE 0.84 11/25/2022   GFR 70.32 11/25/2022   GFRNONAA >60 05/19/2022   GFRAA >60 09/03/2016   No results found for: "HGBA1C", "FRUCTOSAMINE", "MICROALBUR"  Lab Results  Component Value Date   CHOL 199 11/25/2022   HDL 82.70 11/25/2022   LDLCALC 97 11/25/2022   LDLDIRECT 104.0 11/01/2019   TRIG 97.0 11/25/2022   CHOLHDL 2 11/25/2022    Care Gaps: Annual wellness visit in last year? Yes  Star Rating Drugs:  Rosuvastatin 20 mg last filled 08/28/2022 90 DS   Future Appointments  Date Time Provider Department Center  01/14/2023 11:00 AM Terri Piedra, MD [facility] None  02/11/2023  9:00 AM Marcos Eke, PA-C [facility] None  06/08/2023 12:00 PM Erroll Luna, [facility] None  06/16/2023  9:00 AM Shelva Majestic, MD [facility]    April D Calhoun, [facility] Clinical Pharmacist Assistant [phone]

## 2023-01-06 NOTE — Telephone Encounter (Signed)
Patient Advocate Encounter  Prior Authorization for AIMOVIG  has been approved.    PA#  [id number] Effective dates: 1.1.24 through 12.31.24

## 2023-01-08 DIAGNOSIS — G249 Dystonia, unspecified: Secondary | ICD-10-CM | POA: Diagnosis not present

## 2023-01-09 ENCOUNTER — Encounter: Payer: Self-pay | Admitting: Dermatology

## 2023-01-09 ENCOUNTER — Other Ambulatory Visit: Payer: Self-pay | Admitting: Physician Assistant

## 2023-01-12 NOTE — Telephone Encounter (Signed)
Pt is returning call re: appointment change.

## 2023-01-13 ENCOUNTER — Encounter: Payer: Self-pay | Admitting: Sports Medicine

## 2023-01-13 ENCOUNTER — Ambulatory Visit: Payer: Medicare HMO | Admitting: Sports Medicine

## 2023-01-13 DIAGNOSIS — M25551 Pain in right hip: Secondary | ICD-10-CM | POA: Diagnosis not present

## 2023-01-13 DIAGNOSIS — F3341 Major depressive disorder, recurrent, in partial remission: Secondary | ICD-10-CM | POA: Diagnosis not present

## 2023-01-13 DIAGNOSIS — S76011S Strain of muscle, fascia and tendon of right hip, sequela: Secondary | ICD-10-CM | POA: Diagnosis not present

## 2023-01-13 DIAGNOSIS — R69 Illness, unspecified: Secondary | ICD-10-CM | POA: Diagnosis not present

## 2023-01-13 NOTE — Progress Notes (Signed)
Danielle Rocha - 72 y.o. female MRN [id number]  Date of birth: Jan 06, 1951  Office Visit Note: Visit Date: 01/13/2023 PCP: Shelva Majestic, MD Referred by: Shelva Majestic, MD  Subjective: Chief Complaint  Patient presents with   Right Hip - Follow-up   HPI: Danielle Rocha is a pleasant 72 y.o. female who presents today for follow-up of right lateral hip pain.  History of chronic lateral hip pain with prior piriformis release in status post right gluteus medius repair about 8 months ago.  Has been doing use ESWT, nitroglycerin patch, home therapy with excellent relief of her pain. She is able to do much more activity with pain limiting her.  Hip pain has been slightly worse over the past 2 days, but was doing a lot of sitting on hard bench. Leaving for trip to [place].   Today is consistent ECSWT #8 today.  Pertinent ROS were reviewed with the patient and found to be negative unless otherwise specified above in HPI.   Assessment & Plan: Visit Diagnoses:  1. Tear of right gluteus medius tendon, sequela   2. Lateral pain of right hip    Plan: [place] has been making excellent relief with extracorporeal shockwave treatment therapy, nitroglycerin patch protocol and her home therapy.  She will continue these treatment modalities, did repeat ESWT today.  She had a very mild exacerbation of her pain which I think is more so from sitting on hard surfaces, encouraged her to use a cushion with prolonged sitting.  She is leaving for a trip to [place], but will return over the next 2-3 weeks and we will reevaluate how she is doing.  Will likely discontinue the nitroglycerin patch at that time when she nears the 12-week mark. May use Ice, OTC-antiinflammatories as needed.  Follow-up: Return for F/u in 2-3 weeks for right lateral hip.   Meds & Orders: No orders of the defined types were placed in this encounter.  No orders of the defined types were placed in this encounter.     Procedures: Procedure: ECSWT Indications:  Gluteus medius and greater trochanteric pain   Procedure Details Consent: Risks of procedure as well as the alternatives and risks of each were explained to the patient.  Verbal consent for procedure obtained. Time Out: Verified patient identification, verified procedure, site was marked, verified correct patient position. The area was cleaned with alcohol swab.     The right greater trochanteric region and gluteus medius tendon was targeted for Extracorporeal shockwave therapy.    Preset: Greater Trochanteric Bursitis Power Level: 120 mJ   Frequency: 12 Hz Impulse/cycles: 3600 Head size: Regular   Patient tolerated procedure well without immediate complications.      Clinical History:   She reports that she has never smoked. She has never used smokeless tobacco. No results for input(s): "HGBA1C", "LABURIC" in the last 8760 hours.  Objective:   Vital Signs: LMP  (LMP Unknown)   Physical Exam  Gen: Well-appearing, in no acute distress; non-toxic CV: Regular Rate. Well-perfused. Warm.  Resp: Breathing unlabored on room air; no wheezing. Psych: Fluid speech in conversation; appropriate affect; normal thought process Neuro: Sensation intact throughout. No gross coordination deficits.   Ortho Exam -  Right hip: + trace TTP over the greater trochanteric and insertional gluteal tendons.   There are 2 well-healed scars overlying the lateral hip without signs of infection.  No restriction to internal or external rotation about the hip.  There is some mild soft tissue swelling over the  lateral aspect of the hip femur, no redness or erythema.  Neurovascularly intact distally.   Imaging: No results found.  Past Medical/Family/Surgical/Social History: Medications & Allergies reviewed per EMR, new medications updated. Patient Active Problem List   Diagnosis Date Noted   Seasonal allergic rhinitis due to pollen 01/01/2023   Tear of right  gluteus medius tendon    Involuntary movements 02/14/2020   Aortic atherosclerosis 02/07/2020   Delayed gastric emptying 02/07/2020   Dystonia    Restless legs 07/27/2018   Recurrent UTI 10/07/2016   Major depression in full remission 09/02/2016   GERD (gastroesophageal reflux disease) 01/30/2016   History of SI/intentional drug overdose 11/22/2015   Atrial fibrillation    Chronic pain syndrome    Hyperlipidemia    Past Medical History:  Diagnosis Date   Anemia    Arthritis    DJD, low back, thumb   Atrial fibrillation    Xarelto anticoagulation. Flecainide antiarrythmic    Chicken pox    Chronic pain syndrome    On disability. History of bilateral hip pain, low back pain. Gabapentin 400 BID, percocet 1 tablet daily per prior provider.    Colon polyp    awaiting records   Depression    zoloft , remeron  per psychiatry. ambien  per psychiatry to help wtih sleep element.    Dysrhythmia    Dystonia    described as psychogenic dystonia. Pain and twisting from upper chest and up with triggers "wind, creamy food" on TID ativan per psychiatry previously.    Fibromyalgia    GERD (gastroesophageal reflux disease)    omeprazole OTC   Goiter    states multiple imaging tests, has had biopsies   Hyperlipidemia    lovastatin    Stroke    TIA (left side of face and body decreased sensitivity than right face and side)   TIA (transient ischemic attack)    Vaginal atrophy    estrace vaginal cream   Family History  Problem Relation Age of Onset   Alcohol abuse Mother        possible she had stomach cancer as well but unsure   Hypertension Father    Alcohol abuse Father    Pancreatic cancer Father    Cancer Brother        spindle cell RLE   Diabetes Brother    Cancer Brother        tonsil cancer   Skin cancer Brother    Colon cancer Neg Hx    Colon polyps Neg Hx    Stomach cancer Neg Hx    Past Surgical History:  Procedure Laterality Date   ATRIAL  FIBRILLATION ABLATION     BIOPSY THYROID     fusion l4-l5  09/22/1998   GLUTEUS MINIMUS REPAIR Right 05/19/2022   Procedure: RIGHT HIP GLUTEUS MEDIUS TENDON REPAIR;  Surgeon: Huel Cote, MD;  Location: [facility] OR;  Service: Orthopedics;  Laterality: Right;   LAMINECTOMY     L4-L5   OTHER SURGICAL HISTORY     tennis elbow surgery   piriformis release  09/22/1997   right hip   TONSILLECTOMY AND ADENOIDECTOMY  age 65   VAGINAL HYSTERECTOMY  09/23/1991   Social History   Occupational History   Occupation: retired  Tobacco Use   Smoking status: Never   Smokeless tobacco: Never  Vaping Use   Vaping Use: Never used  Substance and Sexual Activity   Alcohol use: No    Alcohol/week: 0.0 standard drinks of alcohol  Drug use: No   Sexual activity: Not Currently    Partners: Male

## 2023-01-13 NOTE — Progress Notes (Signed)
Doing ok; hip was hurting yesterday but otherwise fine.

## 2023-01-14 ENCOUNTER — Encounter: Payer: Self-pay | Admitting: Dermatology

## 2023-01-14 ENCOUNTER — Ambulatory Visit: Payer: Medicare HMO | Admitting: Dermatology

## 2023-01-14 VITALS — BP 102/64

## 2023-01-14 DIAGNOSIS — Z1283 Encounter for screening for malignant neoplasm of skin: Secondary | ICD-10-CM

## 2023-01-14 DIAGNOSIS — L570 Actinic keratosis: Secondary | ICD-10-CM

## 2023-01-14 DIAGNOSIS — W908XXA Exposure to other nonionizing radiation, initial encounter: Secondary | ICD-10-CM | POA: Diagnosis not present

## 2023-01-14 DIAGNOSIS — L578 Other skin changes due to chronic exposure to nonionizing radiation: Secondary | ICD-10-CM

## 2023-01-14 DIAGNOSIS — L820 Inflamed seborrheic keratosis: Secondary | ICD-10-CM | POA: Diagnosis not present

## 2023-01-14 DIAGNOSIS — D229 Melanocytic nevi, unspecified: Secondary | ICD-10-CM

## 2023-01-14 DIAGNOSIS — L814 Other melanin hyperpigmentation: Secondary | ICD-10-CM | POA: Diagnosis not present

## 2023-01-14 DIAGNOSIS — D492 Neoplasm of unspecified behavior of bone, soft tissue, and skin: Secondary | ICD-10-CM

## 2023-01-14 DIAGNOSIS — L821 Other seborrheic keratosis: Secondary | ICD-10-CM | POA: Diagnosis not present

## 2023-01-14 DIAGNOSIS — D1801 Hemangioma of skin and subcutaneous tissue: Secondary | ICD-10-CM | POA: Diagnosis not present

## 2023-01-14 DIAGNOSIS — X32XXXA Exposure to sunlight, initial encounter: Secondary | ICD-10-CM

## 2023-01-14 NOTE — Progress Notes (Signed)
New Patient Visit   Subjective  Danielle Rocha is a 72 y.o. female who presents for the following: Skin Cancer Screening and Full Body Skin Exam  The patient presents for Total-Body Skin Exam (TBSE) for skin cancer screening and mole check. The patient has spots, moles and lesions to be evaluated, some may be new or changing and the patient has concerns that these could be cancer.  No personal history of skin cancer. She is concerned of lesions on the left chest, right upper arm, and right forehead. She says they get sensitive and irritated when rubbed.    The following portions of the chart were reviewed this encounter and updated as appropriate: medications, allergies, medical history  Review of Systems:  No other skin or systemic complaints except as noted in HPI or Assessment and Plan.  Objective  Well appearing patient in no apparent distress; mood and affect are within normal limits.  A full examination was performed including scalp, head, eyes, ears, nose, lips, neck, chest, axillae, abdomen, back, buttocks, bilateral upper extremities, bilateral lower extremities, hands, feet, fingers, toes, fingernails, and toenails. All findings within normal limits unless otherwise noted below.   Relevant physical exam findings are noted in the Assessment and Plan.    Assessment & Plan   LENTIGINES, SEBORRHEIC KERATOSES, HEMANGIOMAS - Benign normal skin lesions - Benign-appearing - Call for any changes  MELANOCYTIC NEVI - Tan-brown and/or pink-flesh-colored symmetric macules and papules - Benign appearing on exam today - Observation - Call clinic for new or changing moles - Recommend daily use of broad spectrum spf 30+ sunscreen to sun-exposed areas.   ACTINIC DAMAGE - Chronic condition, secondary to cumulative UV/sun exposure - diffuse scaly erythematous macules with underlying dyspigmentation - Recommend daily broad spectrum sunscreen SPF 30+ to sun-exposed areas, reapply  every 2 hours as needed.  - Staying in the shade or wearing long sleeves, sun glasses (UVA+UVB protection) and wide brim hats (4-inch brim around the entire circumference of the hat) are also recommended for sun protection.  - Call for new or changing lesions.  ACTINIC KERATOSIS Exam: Erythematous thin papules/macules with gritty scale  Actinic keratoses are precancerous spots that appear secondary to cumulative UV radiation exposure/sun exposure over time. They are chronic with expected duration over 1 year. A portion of actinic keratoses will progress to squamous cell carcinoma of the skin. It is not possible to reliably predict which spots will progress to skin cancer and so treatment is recommended to prevent development of skin cancer.  Recommend daily broad spectrum sunscreen SPF 30+ to sun-exposed areas, reapply every 2 hours as needed.  Recommend staying in the shade or wearing long sleeves, sun glasses (UVA+UVB protection) and wide brim hats (4-inch brim around the entire circumference of the hat). Call for new or changing lesions.  Treatment Plan:  Prior to procedure, discussed risks of blister formation, small wound, skin dyspigmentation, or rare scar following cryotherapy. Recommend Vaseline ointment to treated areas while healing.  Destruction Procedure Note Destruction method: cryotherapy   Informed consent: discussed and consent obtained   Lesion destroyed using liquid nitrogen: Yes   Outcome: patient tolerated procedure well with no complications   Post-procedure details: wound care instructions given   Locations: right lateral forehead, left paranasal cheek, bilateral temples, right upper arm, left upper arm, # of Lesions Treated: 6    SKIN CANCER SCREENING PERFORMED TODAY.      No follow-ups on file.  Jaclynn Guarneri, CMA, am acting as scribe for  Langston Reusing, MD.   Documentation: I have reviewed the above documentation for accuracy and completeness, and I  agree with the above.  Langston Reusing, MD

## 2023-01-14 NOTE — Patient Instructions (Addendum)
Due to recent changes in healthcare laws, you may see results of your pathology and/or laboratory studies on MyChart before the doctors have had a chance to review them. We understand that in some cases there may be results that are confusing or concerning to you. Please understand that not all results are received at the same time and often the doctors may need to interpret multiple results in order to provide you with the best plan of care or course of treatment. Therefore, we ask that you please give Danielle Rocha 2 business days to thoroughly review all your results before contacting the office for clarification. Should we see a critical lab result, you will be contacted sooner.   If You Need Anything After Your Visit  If you have any questions or concerns for your doctor, please call our main line at [phone] If no one answers, please leave a voicemail as directed and we will return your call as soon as possible. Messages left after 4 pm will be answered the following business day.   You may also send Danielle Rocha a message via [place]. We typically respond to MyChart messages within 1-2 business days.  For prescription refills, please ask your pharmacy to contact our office. Our fax number is [phone].  If you have an urgent issue when the clinic is closed that cannot wait until the next business day, you can page your doctor at the number below.    Please note that while we do our best to be available for urgent issues outside of office hours, we are not available 24/7.   If you have an urgent issue and are unable to reach Danielle Rocha, you may choose to seek medical care at your doctor's office, retail clinic, urgent care center, or emergency room.  If you have a medical emergency, please immediately call 911 or go to the emergency department. In the event of inclement weather, please call our main line at [phone] for an update on the status of any delays or closures.  Dermatology Medication Tips: Please  keep the boxes that topical medications come in in order to help keep track of the instructions about where and how to use these. Pharmacies typically print the medication instructions only on the boxes and not directly on the medication tubes.   If your medication is too expensive, please contact our office at [phone] or send Danielle Rocha a message through [place].   We are unable to tell what your co-pay for medications will be in advance as this is different depending on your insurance coverage. However, we may be able to find a substitute medication at lower cost or fill out paperwork to get insurance to cover a needed medication.   If a prior authorization is required to get your medication covered by your insurance company, please allow Danielle Rocha 1-2 business days to complete this process.  Drug prices often vary depending on where the prescription is filled and some pharmacies may offer cheaper prices.  The website www.goodrx.com contains coupons for medications through different pharmacies. The prices here do not account for what the cost may be with help from insurance (it may be cheaper with your insurance), but the website can give you the price if you did not use any insurance.  - You can print the associated coupon and take it with your prescription to the pharmacy.  - You may also stop by our office during regular business hours and pick up a GoodRx coupon card.  - If you need your  prescription sent electronically to a different pharmacy, notify our office through [facility] or by phone at [phone]    Skin Education :   I counseled the patient regarding the following: Sun screen (SPF 30 or greater) should be applied during peak UV exposure (between 10am and 2pm) and reapplied after exercise or swimming.  The ABCDEs of melanoma were reviewed with the patient, and the importance of monthly self-examination of moles was emphasized. Should any moles change in shape or color, or itch,  bleed or burn, pt will contact our office for evaluation sooner then their interval appointment.  Plan: Sunscreen Recommendations I recommended a broad spectrum sunscreen with a SPF of 30 or higher. I explained that SPF 30 sunscreens block approximately 97 percent of the sun's harmful rays. Sunscreens should be applied at least 15 minutes prior to expected sun exposure and then every 2 hours after that as long as sun exposure continues. If swimming or exercising sunscreen should be reapplied every 45 minutes to an hour after getting wet or sweating. One ounce, or the equivalent of a shot glass full of sunscreen, is adequate to protect the skin not covered by a bathing suit. I also recommended a lip balm with a sunscreen as well. Sun protective clothing can be used in lieu of sunscreen but must be worn the entire time you are exposed to the sun's rays. Cryotherapy Aftercare  Wash gently with soap and water everyday.   Apply Vaseline and Band-Aid daily until healed. I counseled the patient regarding the following: Skin Care: Actinic Damage can improve with broad spectrum sunscreen, sun avoidance, bleaching creams, retinoids, chemical peels and laser. Expectations: Actinic Damage is photo-aging from excessive sun exposure. It manifests as unwanted pigmentation, wrinkles and textural thinning of the skin.  I recommended the following: Broad Spectrum Sunscreen SPF 30+ - SPF 30 daily to face, neck, chest and hands, reapplying every 3 hours when outside for long periods of time Cryotherapy Aftercare  Wash gently with soap and water everyday.   Apply Vaseline and Band-Aid daily until healed.

## 2023-01-15 DIAGNOSIS — G249 Dystonia, unspecified: Secondary | ICD-10-CM | POA: Diagnosis not present

## 2023-01-19 NOTE — Progress Notes (Signed)
Hi Danielle Rocha  Dr. Onalee Hua reviewed your biopsy results and it showed the spot removed was benign (not cancerous).  No additional treatment is required.  The detailed report is available to view in MyChart.  Have a great day!  Kind Regards,  Dr. Kermit Balo Care Team

## 2023-01-26 DIAGNOSIS — S6992XA Unspecified injury of left wrist, hand and finger(s), initial encounter: Secondary | ICD-10-CM | POA: Diagnosis not present

## 2023-01-26 DIAGNOSIS — S59902A Unspecified injury of left elbow, initial encounter: Secondary | ICD-10-CM | POA: Diagnosis not present

## 2023-01-30 DIAGNOSIS — M25532 Pain in left wrist: Secondary | ICD-10-CM | POA: Diagnosis not present

## 2023-01-31 ENCOUNTER — Emergency Department (HOSPITAL_BASED_OUTPATIENT_CLINIC_OR_DEPARTMENT_OTHER)
Admission: EM | Admit: 2023-01-31 | Discharge: 2023-01-31 | Disposition: A | Discharge status: Home / Self Care | Payer: Medicare HMO | Attending: Emergency Medicine | Admitting: Emergency Medicine

## 2023-01-31 ENCOUNTER — Encounter (HOSPITAL_BASED_OUTPATIENT_CLINIC_OR_DEPARTMENT_OTHER): Payer: Self-pay | Admitting: Emergency Medicine

## 2023-01-31 ENCOUNTER — Emergency Department (HOSPITAL_BASED_OUTPATIENT_CLINIC_OR_DEPARTMENT_OTHER): Payer: Medicare HMO

## 2023-01-31 ENCOUNTER — Other Ambulatory Visit: Payer: Self-pay

## 2023-01-31 DIAGNOSIS — W010XXA Fall on same level from slipping, tripping and stumbling without subsequent striking against object, initial encounter: Secondary | ICD-10-CM | POA: Insufficient documentation

## 2023-01-31 DIAGNOSIS — R0781 Pleurodynia: Secondary | ICD-10-CM | POA: Diagnosis not present

## 2023-01-31 DIAGNOSIS — S2232XA Fracture of one rib, left side, initial encounter for closed fracture: Secondary | ICD-10-CM | POA: Diagnosis not present

## 2023-01-31 DIAGNOSIS — M542 Cervicalgia: Secondary | ICD-10-CM | POA: Diagnosis not present

## 2023-01-31 DIAGNOSIS — S299XXA Unspecified injury of thorax, initial encounter: Secondary | ICD-10-CM | POA: Diagnosis not present

## 2023-01-31 DIAGNOSIS — I4891 Unspecified atrial fibrillation: Secondary | ICD-10-CM | POA: Insufficient documentation

## 2023-01-31 DIAGNOSIS — Z7901 Long term (current) use of anticoagulants: Secondary | ICD-10-CM | POA: Insufficient documentation

## 2023-01-31 DIAGNOSIS — S0990XA Unspecified injury of head, initial encounter: Secondary | ICD-10-CM | POA: Diagnosis not present

## 2023-01-31 DIAGNOSIS — R519 Headache, unspecified: Secondary | ICD-10-CM | POA: Diagnosis not present

## 2023-01-31 MED ORDER — LIDOCAINE 5 % EX PTCH
1.0000 | MEDICATED_PATCH | Freq: Once | CUTANEOUS | Status: DC
  Administered 2023-01-31: 1 via TRANSDERMAL
  Filled 2023-01-31: qty 1

## 2023-01-31 MED ORDER — OXYCODONE-ACETAMINOPHEN 5-325 MG PO TABS
1.0000 | ORAL_TABLET | Freq: Once | ORAL | Status: AC
  Administered 2023-01-31: 1 via ORAL
  Filled 2023-01-31: qty 1

## 2023-01-31 MED ORDER — SENNOSIDES-DOCUSATE SODIUM 8.6-50 MG PO TABS: 1.0000 | ORAL_TABLET | Freq: Every day | ORAL | 0 refills | Status: DC

## 2023-01-31 MED ORDER — LIDOCAINE 5 % EX PTCH: 1.0000 | MEDICATED_PATCH | CUTANEOUS | 0 refills | Status: DC

## 2023-01-31 MED ORDER — OXYCODONE HCL 5 MG PO TABS: 5.0000 mg | ORAL_TABLET | Freq: Four times a day (QID) | ORAL | 0 refills | Status: DC | PRN

## 2023-01-31 NOTE — ED Provider Notes (Signed)
Danielle Rocha EMERGENCY DEPARTMENT AT [facility] Provider Note   CSN: [id number] Arrival date & time: 01/31/23  0750     History  Chief Complaint  Patient presents with   Danielle Rocha    Danielle Rocha is a 72 y.o. female.  Patient is a 72 year old female with a past medical history of A-fib on Xarelto presenting to the emergency department with left-sided rib pain after a fall.  The patient states that last week she tripped on the curb and fell on her left side.  She states that she is unsure if she hit her head but denies loss of consciousness.  She states that she initially had pain all along her left side and was seen by orthopedics for her wrist pain and was diagnosed with a wrist fracture.  She states however she has been having progressively worsening left-sided rib pain and was recommended to come to the emergency department.  She states that she has been having headaches and nausea since the fall but denies any vomiting.  She denies any numbness or weakness.  She states that she has been having some mild neck pain.  She denies any other pain or injuries from the fall.  She states that she has been taking Tylenol for her symptoms at home.  The history is provided by the patient.  Fall       Home Medications Prior to Admission medications   Medication Sig Start Date End Date Taking? Authorizing Provider  lidocaine (LIDODERM) 5 % Place 1 patch onto the skin daily. Remove & Discard patch within 12 hours or as directed by MD 01/31/23  Yes Theresia Lo, Benetta Spar K, DO  oxyCODONE (ROXICODONE) 5 MG immediate release tablet Take 1 tablet (5 mg total) by mouth every 6 (six) hours as needed for severe pain. 01/31/23  Yes Theresia Lo, [place] K, DO  senna-docusate (SENOKOT-S) 8.6-50 MG tablet Take 1 tablet by mouth daily. 01/31/23  Yes Elayne Snare K, DO  acetaminophen (TYLENOL) 500 MG tablet Take 1,000 mg by mouth every 6 (six) hours as needed for mild pain, moderate pain, fever or  headache.    [provider]  buPROPion (WELLBUTRIN XL) 150 MG 24 hr tablet Take 300 mg by mouth daily.    [provider]  cephALEXin (KEFLEX) 250 MG capsule Take 250 mg by mouth daily. 10/25/19   [provider]  Cholecalciferol (VITAMIN D) 125 MCG (5000 UT) CAPS Take 5,000 Units by mouth daily.    [provider]  Coenzyme Q10 200 MG TABS Take 200 mg by mouth daily.    [provider]  Cyanocobalamin (B-12 PO) Take 1 tablet by mouth daily.    [provider]  DULoxetine (CYMBALTA) 60 MG capsule Take 60 mg by mouth daily.    [provider]  Erenumab-aooe (AIMOVIG) 140 MG/ML SOAJ Inject 140 mg into the skin every 28 (twenty-eight) days. 07/28/22   Marcos Eke, PA-C  flecainide (TAMBOCOR) 100 MG tablet Take 1 tablet by mouth twice daily 11/11/22   Shelva Majestic, MD  fluticasone [facility]) 50 MCG/ACT nasal spray Place 2 sprays into both nostrils daily. 11/21/22   Shelva Majestic, MD  LORazepam (ATIVAN) 0.5 MG tablet Take 1 tablet (0.5 mg total) by mouth daily as needed for anxiety (and dystonia). 01/01/23   Shelva Majestic, MD  Magnesium Oxide 420 MG TABS Take 420 mg by mouth daily.    [provider]  melatonin 5 MG TABS Take 5 mg by mouth at  bedtime.    [provider]  Multiple Vitamin (MULTIVITAMIN WITH MINERALS) TABS tablet Take 1 tablet by mouth daily.    [provider]  nitroGLYCERIN (NITRODUR - DOSED IN MG/24 HR) 0.2 mg/hr patch Place 1 patch (0.2 mg total) onto the skin daily. Cut the patch into 1/4ths --> place 1/4 patch over affected area, change every 24 hours 10/30/22 12/29/22  Madelyn Brunner, DO  ondansetron (ZOFRAN ODT) 4 MG disintegrating tablet Take 1 tablet (4 mg total) by mouth every 8 (eight) hours as needed for nausea or vomiting. Patient not taking: Reported on 01/01/2023 03/20/21   Alvira Monday, MD  pantoprazole (PROTONIX) 20 MG tablet Take 1 tablet (20 mg total) by mouth 2 (two) times  daily before a meal. 11/21/22   Shelva Majestic, MD  pyridOXINE (VITAMIN B6) 100 MG tablet Take 100 mg by mouth daily.    [provider]  rosuvastatin (CRESTOR) 20 MG tablet Take 1 tablet by mouth once daily 06/02/22   Shelva Majestic, MD  SUMAtriptan (IMITREX) 100 MG tablet TAKE ONE TABLET BY MOUTH AT ONSET OF MIGRAINE FOR UP TO 1 DOSE 01/09/23   Marcos Eke, PA-C  traZODone (DESYREL) 50 MG tablet TAKE 1 TABLET BY MOUTH AT BEDTIME AS NEEDED FOR SLEEP 12/30/22   Shelva Majestic, MD  XARELTO 20 MG TABS tablet TAKE 1 TABLET BY MOUTH ONCE DAILY WITH SUPPER 09/24/22   Shelva Majestic, MD      Allergies    Reglan [metoclopramide], Ciprofloxacin, Clindamycin/lincomycin, Doxycycline, Macrobid [nitrofurantoin monohyd macro], and Sulfa antibiotics    Review of Systems   Review of Systems  Physical Exam Updated Vital Signs BP (!) 151/61   Pulse 70   Temp 98.2 F (36.8 C) (Oral)   Resp 20   LMP  (LMP Unknown)   SpO2 100%  Physical Exam Vitals and nursing note reviewed.  Constitutional:      General: She is not in acute distress.    Appearance: Normal appearance.  HENT:     Head: Normocephalic and atraumatic.     Nose: Nose normal.     Mouth/Throat:     Mouth: Mucous membranes are moist.     Pharynx: Oropharynx is clear.  Eyes:     Extraocular Movements: Extraocular movements intact.     Conjunctiva/sclera: Conjunctivae normal.  Neck:     Comments: No midline neck tenderness Cardiovascular:     Rate and Rhythm: Normal rate and regular rhythm.     Heart sounds: Normal heart sounds.  Pulmonary:     Effort: Pulmonary effort is normal.     Breath sounds: Normal breath sounds.  Abdominal:     General: Abdomen is flat.     Palpations: Abdomen is soft.     Tenderness: There is no abdominal tenderness.  Musculoskeletal:        General: Normal range of motion.     Cervical back: Normal range of motion and neck supple.     Comments: No midline back tenderness Left lateral  and anterior inferior rib tenderness to palpation Left wrist in splint, no bony tenderness to left elbow or shoulder, right upper extremity or bilateral lower extremities Pelvis stable, nontender  Skin:    General: Skin is warm and dry.  Neurological:     General: No focal deficit present.     Mental Status: She is alert and oriented to person, place, and time.     Sensory: No sensory deficit.  Motor: No weakness.  Psychiatric:        Mood and Affect: Mood normal.        Behavior: Behavior normal.     ED Results / Procedures / Treatments   Labs (all labs ordered are listed, but only abnormal results are displayed) Labs Reviewed - No data to display  EKG None  Radiology CT Chest Wo Contrast  Result Date: 01/31/2023 CLINICAL DATA:  Tripped on a curb and fell injuring left side. Severe left rib pain. EXAM: CT CHEST WITHOUT CONTRAST TECHNIQUE: Multidetector CT imaging of the chest was performed following the standard protocol without IV contrast. RADIATION DOSE REDUCTION: This exam was performed according to the departmental dose-optimization program which includes automated exposure control, adjustment of the mA and/or kV according to patient size and/or use of iterative reconstruction technique. COMPARISON:  09/02/2015 FINDINGS: Cardiovascular: The heart size is normal. No substantial pericardial effusion. Coronary artery calcification is evident. Mild atherosclerotic calcification is noted in the wall of the thoracic aorta. Mediastinum/Nodes: No mediastinal lymphadenopathy. No evidence for gross hilar lymphadenopathy although assessment is limited by the lack of intravenous contrast on the current study. The esophagus has normal imaging features. There is no axillary lymphadenopathy. 15 mm low-density nodule identified in the thyroid isthmus stable since study from more than 7 years ago. Stability for greater than 5 years implies benignity; no biopsy or followup indicated as this lesion  was previously biopsied in 2022. (Ref: J Am Coll Radiol. 2015 Feb;12(2): 143-50). Lungs/Pleura: Trace biapical pleuroparenchymal scarring. No evidence for pneumothorax. No focal airspace consolidation. No pleural effusion. No suspicious pulmonary nodule or mass. Upper Abdomen: Stable tiny hypodensity medial segment left liver compatible with benign etiology such as a cyst. No followup imaging is recommended. Musculoskeletal: No worrisome lytic or sclerotic osseous abnormality. Sternal deformity may be related to remote healed fracture. Probable nondisplaced acute fracture of the anterolateral left fourth rib. IMPRESSION: 1. Probable nondisplaced acute fracture of the anterolateral left fourth rib. 2. Sternal deformity may be related to remote healed fracture. 3. 15 mm low-density nodule in the thyroid isthmus stable since study from more than 7 years ago. No followup imaging is recommended. 4.  Aortic Atherosclerosis (ICD10-I70.0). Electronically Signed   By: Kennith Center M.D.   On: 01/31/2023 08:57   CT Head Wo Contrast  Result Date: 01/31/2023 CLINICAL DATA:  Fall on Monday. Left wrist fractures. Severe left rib pain and use of anticoagulation EXAM: CT HEAD WITHOUT CONTRAST CT CERVICAL SPINE WITHOUT CONTRAST TECHNIQUE: Multidetector CT imaging of the head and cervical spine was performed following the standard protocol without intravenous contrast. Multiplanar CT image reconstructions of the cervical spine were also generated. RADIATION DOSE REDUCTION: This exam was performed according to the departmental dose-optimization program which includes automated exposure control, adjustment of the mA and/or kV according to patient size and/or use of iterative reconstruction technique. COMPARISON:  Brain MRI 01/05/2022 FINDINGS: CT HEAD FINDINGS Brain: No evidence of acute infarction, hemorrhage, hydrocephalus, extra-axial collection or mass lesion/mass effect. Vascular: No hyperdense vessel or unexpected  calcification. Skull: Normal. Negative for fracture or focal lesion. Sinuses/Orbits: No acute finding. CT CERVICAL SPINE FINDINGS Alignment: No traumatic malalignment Skull base and vertebrae: No acute fracture. No primary bone lesion or focal pathologic process. Soft tissues and spinal canal: No prevertebral fluid or swelling. No visible canal hematoma. Disc levels:  Unremarkable for age Upper chest: Reported separately IMPRESSION: No evidence of acute intracranial or cervical spine injury. Electronically Signed   By: Tiburcio Pea  M.D.   On: 01/31/2023 08:48   CT Cervical Spine Wo Contrast  Result Date: 01/31/2023 CLINICAL DATA:  Fall on Monday. Left wrist fractures. Severe left rib pain and use of anticoagulation EXAM: CT HEAD WITHOUT CONTRAST CT CERVICAL SPINE WITHOUT CONTRAST TECHNIQUE: Multidetector CT imaging of the head and cervical spine was performed following the standard protocol without intravenous contrast. Multiplanar CT image reconstructions of the cervical spine were also generated. RADIATION DOSE REDUCTION: This exam was performed according to the departmental dose-optimization program which includes automated exposure control, adjustment of the mA and/or kV according to patient size and/or use of iterative reconstruction technique. COMPARISON:  Brain MRI 01/05/2022 FINDINGS: CT HEAD FINDINGS Brain: No evidence of acute infarction, hemorrhage, hydrocephalus, extra-axial collection or mass lesion/mass effect. Vascular: No hyperdense vessel or unexpected calcification. Skull: Normal. Negative for fracture or focal lesion. Sinuses/Orbits: No acute finding. CT CERVICAL SPINE FINDINGS Alignment: No traumatic malalignment Skull base and vertebrae: No acute fracture. No primary bone lesion or focal pathologic process. Soft tissues and spinal canal: No prevertebral fluid or swelling. No visible canal hematoma. Disc levels:  Unremarkable for age Upper chest: Reported separately IMPRESSION: No evidence  of acute intracranial or cervical spine injury. Electronically Signed   By: Tiburcio Pea M.D.   On: 01/31/2023 08:48    Procedures Procedures    Medications Ordered in ED Medications  lidocaine (LIDODERM) 5 % 1 patch (1 patch Transdermal Patch Applied 01/31/23 0852)  oxyCODONE-acetaminophen (PERCOCET/ROXICET) 5-325 MG per tablet 1 tablet (has no administration in time range)    ED Course/ Medical Decision Making/ A&P Clinical Course as of 01/31/23 0927  Sat Jan 31, 2023  0916 CT head and C-spine negative, CT chest with non-displaced L 4th rib fx. Patient will be given incentive spirometer and is stable for discharge with PCP follow up. [VK]    Clinical Course User Index [VK] Rexford Maus, DO                             Medical Decision Making This patient presents to the ED with chief complaint(s) of fall with pertinent past medical history of A-fib on Xarelto which further complicates the presenting complaint. The complaint involves an extensive differential diagnosis and also carries with it a high risk of complications and morbidity.    The differential diagnosis includes although patient is a week out from her fall with her recurrent headaches and nausea on blood thinners and concern for ICH, mass effect, cervical spine fracture, rib fracture, pulmonary contusion, hemothorax, pneumothorax, she no presyncopal symptoms making syncopal fall unlikely  Additional history obtained: Additional history obtained from N/A Records reviewed Care Everywhere/External Records -outpatient orthopedic records  ED Course and Reassessment: On patient's arrival to the emergency department she is hemodynamically stable in no acute distress without focal neurologic deficits.  Due to her fall on thinners and recurrent headaches she will have a CT head and C-spine in addition to a CT chest to evaluate for acute traumatic injury.  She will be given lidocaine patch for symptomatic management and  will be closely reassessed.  Independent labs interpretation:  N/A  Independent visualization of imaging: - I independently visualized the following imaging with scope of interpretation limited to determining acute life threatening conditions related to emergency care: CTH/C-spine, CT thorax, which revealed single L 4th rib fx  Consultation: - Consulted or discussed management/test interpretation w/ external professional: N/A  Consideration for admission or further workup:  Patient has no emergent conditions requiring admission or further work-up at this time and is stable for discharge home with primary care follow-up  Social Determinants of health: N/A    Amount and/or Complexity of Data Reviewed Radiology: ordered.  Risk Prescription drug management.          Final Clinical Impression(s) / ED Diagnoses Final diagnoses:  Closed fracture of one rib of left side, initial encounter    Rx / DC Orders ED Discharge Orders          Ordered    lidocaine (LIDODERM) 5 %  Every 24 hours        01/31/23 0925    oxyCODONE (ROXICODONE) 5 MG immediate release tablet  Every 6 hours PRN        01/31/23 0925    senna-docusate (SENOKOT-S) 8.6-50 MG tablet  Daily        01/31/23 0925              Rexford Maus, DO 01/31/23 [phone]

## 2023-01-31 NOTE — ED Triage Notes (Signed)
Pt fell on Monday, tripped on curb, and fell on her left side. She has seen ortho, has a known fracture in her left wrist, to see hand surgeon next week. But now has severe left rib pain and is on xarelto. Ortho rec CT scan .

## 2023-01-31 NOTE — Discharge Instructions (Signed)
You were seen in the emergency department for your rib pain after your fall.  You do have a rib fracture but no signs of bruising or bleeding to your lungs and no signs of injury to your head or your neck.  Your rib fracture should heal on its own and you can take Tylenol as needed every 6 hours for pain and I have given you some oxycodone for breakthrough pain.  This can make you drowsy so do not take it before driving, working or operating heavy machinery.  It can also make you constipated so have given you a stool softener that you should take while you are on the narcotics.  You can also use lidocaine patches, ice or heat.  We have also given you an incentive spirometer that you should practice taking deep breaths and to several times an hour multiple times a day to prevent pneumonia.  You should follow-up with your primary doctor in the next few days to have your symptoms rechecked.  You should return to the emergency department for significantly worsening pain, severe shortness of breath, fevers or if you have any other new or concerning symptoms.

## 2023-02-02 ENCOUNTER — Encounter: Payer: Self-pay | Admitting: Sports Medicine

## 2023-02-02 ENCOUNTER — Ambulatory Visit: Payer: Medicare HMO | Admitting: Sports Medicine

## 2023-02-02 ENCOUNTER — Other Ambulatory Visit (INDEPENDENT_AMBULATORY_CARE_PROVIDER_SITE_OTHER): Payer: Medicare HMO

## 2023-02-02 DIAGNOSIS — S62112A Displaced fracture of triquetrum [cuneiform] bone, left wrist, initial encounter for closed fracture: Secondary | ICD-10-CM | POA: Diagnosis not present

## 2023-02-02 DIAGNOSIS — M25532 Pain in left wrist: Secondary | ICD-10-CM | POA: Diagnosis not present

## 2023-02-02 DIAGNOSIS — S8002XA Contusion of left knee, initial encounter: Secondary | ICD-10-CM

## 2023-02-02 DIAGNOSIS — M25562 Pain in left knee: Secondary | ICD-10-CM

## 2023-02-02 DIAGNOSIS — S2232XA Fracture of one rib, left side, initial encounter for closed fracture: Secondary | ICD-10-CM | POA: Diagnosis not present

## 2023-02-02 MED ORDER — HYDROCODONE-ACETAMINOPHEN 5-325 MG PO TABS: 1.0000 | ORAL_TABLET | Freq: Four times a day (QID) | ORAL | 0 refills | Status: DC | PRN

## 2023-02-02 NOTE — Progress Notes (Signed)
Danielle Rocha - 72 y.o. female MRN [id number]  Date of birth: Jun 14, 1951  Office Visit Note: Visit Date: 02/02/2023 PCP: Shelva Majestic, MD Referred by: Shelva Majestic, MD  Subjective: Chief Complaint  Patient presents with   Right Hip - Follow-up   HPI: Danielle Rocha is a pleasant 72 y.o. female who presents today for left wrist fracture, knee pain, rib fracture. Had a fall one week ago last Monday.   Was seen at [facility] and diagnosed with a wrist fracture.  Note reviewed from 01/30/23 with Harlene Salts, PA. Suspected triquetral fracture.   Seen at [facility] ED on 01/31/23 - had CT chest with non-displaced L 4th rib fx. Patient was given incentive spirometer.  She has been using this to keep her lungs open.  They did prescribe her a short course of oxycodone which she is taking infrequently for pain control.  It is difficult to sleep at night.  She also landed over the anterior aspect of the left knee.  Does note bruising and swelling.  Has not had an x-ray of this.  Has been able to walk still.  These incidents did flareup her hip.  Pertinent ROS were reviewed with the patient and found to be negative unless otherwise specified above in HPI.   Assessment & Plan: Visit Diagnoses:  1. Closed displaced fracture of triquetrum of left wrist, initial encounter   2. Closed fracture of one rib of left side, initial encounter   3. Acute pain of left knee   4. Pain in left wrist   5. Contusion of left knee, initial encounter    Plan: Patient has been following up for right lateral hip pain, but 1 week ago had a fall in which she suffered a triquetral fracture.  X-ray does show this involved the dorsal cortical aspect with minimal displacement.  Transitioned from her brace and put her into a short arm cast today for immobilization.  She will remain in a cast for 4 to 6 weeks.  Will have her follow-up in 2 weeks for repeat x-rays in the cast.  X-rays of the knee did not  show any acute pathology but she certainly has a knee contusion and some bruising.  Counseled her on continuing incentive spirometer for her nondisplaced fourth rib fracture.  Will prescribe a short course of oxycodone-acetaminophen 5-325 mg to be taken only as needed for breakthrough pain and to help with sleep.  May continue ice, rest for her ailments as needed. F/u in 2 weeks.   Follow-up: Return in about 2 weeks (around 02/16/2023) for for L wrist fracture.   Meds & Orders:  Meds ordered this encounter  Medications   HYDROcodone-acetaminophen (NORCO/VICODIN) 5-325 MG tablet    Sig: Take 1 tablet by mouth every 6 (six) hours as needed for moderate pain.    Dispense:  10 tablet    Refill:  0    Orders Placed This Encounter  Procedures   XR Knee Complete 4 Views Left   XR Wrist Complete Left     Procedures: No procedures performed      Clinical History: EXAM: MRI CERVICAL SPINE WITHOUT CONTRAST   TECHNIQUE: Multiplanar, multisequence MR imaging of the cervical spine was performed. No intravenous contrast was administered.   COMPARISON:  01/05/2022 brain MRI   FINDINGS: Alignment: Mild anterolisthesis at C2-3.   Vertebrae: No fracture, evidence of discitis, or bone lesion.   Cord: Normal signal and morphology.   Posterior Fossa, vertebral arteries, paraspinal  tissues: Negative.   Disc levels:   C2-3: Slight anterolisthesis although no significant degenerative changes noted   C3-4: Uncovertebral ridging asymmetric to the left where there is moderate foraminal stenosis.   C4-5: Facet spurring and left uncovertebral ridging with mild left foraminal narrowing.   C5-6: Disc narrowing with bulging and uncovertebral ridging. Mild left foraminal narrowing   C6-7: Degenerative facet spurring asymmetric to the left. Mild left uncovertebral ridging. Moderate left foraminal narrowing. Right foraminal root sleeve cyst.   C7-T1:Unremarkable.   IMPRESSION: 1. Cervical  spine degeneration causing moderate foraminal stenosis on the left at C3-4 and C6-7. 2. No inflammation or impingement seen at the skull base to explain radiation to the head.     Electronically Signed   By: Tiburcio Pea M.D.   On: 04/10/2022 17:05  She reports that she has never smoked. She has never used smokeless tobacco. No results for input(s): "HGBA1C", "LABURIC" in the last 8760 hours.  Objective:   Vital Signs: LMP  (LMP Unknown)   Physical Exam  Gen: Well-appearing, in no acute distress; non-toxic CV: Regular Rate. Well-perfused. Warm.  Resp: Breathing unlabored on room air; no wheezing. Psych: Fluid speech in conversation; appropriate affect; normal thought process Neuro: Sensation intact throughout. No gross coordination deficits.   Ortho Exam - Left wrist: There is some soft tissue swelling and mild ecchymosis over the dorsal aspect of the ulnar wrist.  Positive TTP just distal to the ulnar styloid.  There is pain and limitation with extension of the wrist.  No tenderness to palpation in the anatomical snuffbox.  Cap refill less than 2 seconds.  - Left thorax: There is some TTP over the left side of the upper rib cage.  1 small bruise near the T9 region.  - Left knee: There is ecchymosis and mild soft tissue swelling of the anterior aspect of the knee distal to the patella.  There is full range of motion from 0-135 degrees.  No varus or valgus instability.  Negative anterior/posterior drawer.  No medial joint line TTP, there is TTP over the bruising only.  Imaging: XR Knee Complete 4 Views Left  Result Date: 02/02/2023 4 views of the left knee including standing AP, Rosenberg, lateral and sunrise views were ordered and reviewed by myself.  X-rays demonstrate mild medial tibiofemoral joint space narrowing.  There is a small spur off the superior aspect of the patella.  There is evidence of chondrocalcinosis lateral greater than medial joint line.  No acute fracture or  bony abnormality otherwise noted.  XR Wrist Complete Left  Result Date: 02/02/2023 4 views of the left wrist including AP, oblique, lateral and ulnar deviated view were ordered and reviewed by myself.  X-rays show a minimally displaced fracture of the dorsal aspect of the body of the triquetrum.  There is maybe some mild widening of the lunate-triquetral junction.  Neutral ulnar variance.  No other acute bony fracture noted.   Past Medical/Family/Surgical/Social History: Medications & Allergies reviewed per EMR, new medications updated. Patient Active Problem List   Diagnosis Date Noted   Seasonal allergic rhinitis due to pollen 01/01/2023   Tear of right gluteus medius tendon    Involuntary movements 02/14/2020   Aortic atherosclerosis (HCC) 02/07/2020   Delayed gastric emptying 02/07/2020   Dystonia    Restless legs 07/27/2018   Recurrent UTI 10/07/2016   Major depression in full remission (HCC) 09/02/2016   GERD (gastroesophageal reflux disease) 01/30/2016   History of SI/intentional drug overdose 11/22/2015  Atrial fibrillation (HCC)    Chronic pain syndrome    Hyperlipidemia    Past Medical History:  Diagnosis Date   Anemia    Arthritis    DJD, low back, thumb   Atrial fibrillation (HCC)    Xarelto anticoagulation. Flecainide antiarrythmic    Chicken pox    Chronic pain syndrome    On disability. History of bilateral hip pain, low back pain. Gabapentin 400 BID, percocet 1 tablet daily per prior provider.    Colon polyp    awaiting records   Depression    zoloft 100mg , remeron 30mg  per psychiatry. ambien 10mg  per psychiatry to help wtih sleep element.    Dysrhythmia    Dystonia    described as psychogenic dystonia. Pain and twisting from upper chest and up with triggers "wind, creamy food" on TID ativan per psychiatry previously.    Fibromyalgia    GERD (gastroesophageal reflux disease)    omeprazole OTC   Goiter    states multiple imaging tests, has had biopsies    Hyperlipidemia    lovastatin 20mg    Stroke (HCC)    TIA (left side of face and body decreased sensitivity than right face and side)   TIA (transient ischemic attack)    Vaginal atrophy    estrace vaginal cream   Family History  Problem Relation Age of Onset   Alcohol abuse Mother        possible she had stomach cancer as well but unsure   Hypertension Father    Alcohol abuse Father    Pancreatic cancer Father    Cancer Brother        spindle cell RLE   Diabetes Brother    Cancer Brother        tonsil cancer   Skin cancer Brother    Colon cancer Neg Hx    Colon polyps Neg Hx    Stomach cancer Neg Hx    Past Surgical History:  Procedure Laterality Date   ATRIAL FIBRILLATION ABLATION     BIOPSY THYROID     fusion l4-l5  09/22/1998   GLUTEUS MINIMUS REPAIR Right 05/19/2022   Procedure: RIGHT HIP GLUTEUS MEDIUS TENDON REPAIR;  Surgeon: Huel Cote, MD;  Location: [facility] OR;  Service: Orthopedics;  Laterality: Right;   LAMINECTOMY     L4-L5   OTHER SURGICAL HISTORY     tennis elbow surgery   piriformis release  09/22/1997   right hip   TONSILLECTOMY AND ADENOIDECTOMY  age 72   VAGINAL HYSTERECTOMY  09/23/1991   Social History   Occupational History   Occupation: retired  Tobacco Use   Smoking status: Never   Smokeless tobacco: Never  Vaping Use   Vaping Use: Never used  Substance and Sexual Activity   Alcohol use: No    Alcohol/week: 0.0 standard drinks of alcohol   Drug use: No   Sexual activity: Not Currently    Partners: Male

## 2023-02-02 NOTE — Progress Notes (Signed)
Does not feel like last treatment helped all that much [place]  She did have a fall a few days ago that resulted in a fractured wrist, and rib  Her hip pain is making it hard for her to get up

## 2023-02-05 ENCOUNTER — Other Ambulatory Visit: Payer: Self-pay | Admitting: Family Medicine

## 2023-02-11 ENCOUNTER — Encounter: Payer: Self-pay | Admitting: Physician Assistant

## 2023-02-11 ENCOUNTER — Other Ambulatory Visit: Payer: Self-pay | Admitting: Family Medicine

## 2023-02-11 ENCOUNTER — Ambulatory Visit: Payer: Medicare HMO | Admitting: Physician Assistant

## 2023-02-11 VITALS — BP 109/60 | HR 87 | Resp 18 | Ht 68.0 in | Wt 152.0 lb

## 2023-02-11 DIAGNOSIS — Z8669 Personal history of other diseases of the nervous system and sense organs: Secondary | ICD-10-CM

## 2023-02-11 DIAGNOSIS — F09 Unspecified mental disorder due to known physiological condition: Secondary | ICD-10-CM

## 2023-02-11 MED ORDER — SUMATRIPTAN SUCCINATE 100 MG PO TABS: ORAL_TABLET | ORAL | 5 refills | Status: AC

## 2023-02-11 NOTE — Progress Notes (Signed)
Assessment/Plan:   Memory Difficulties  Danielle Rocha is a very pleasant 72 y.o. RH female  with a history of atrial fibrillation on anticoagulation, fibromyalgia, history of TIA, functional dystonia,  chronic pain syndrome, hyperlipidemia, depression, restless leg syndrome, anxiety, thyroid nodule, migraine headaches presenting today in follow-up for evaluation of memory difficulties.  She is not on antidementia medication and there is no indication to initiate any therapy.  Her MMSE today is 30/30.  Her memory is stable, there are no signs of cognitive decline "especially since discontinuing some of the other medications, more awake".  She reports "being happy, and very busy ".      Recommendations:   Follow up in 6 months. No indication for antidementia medication at this time, memory is stable. Recommend good control of cardiovascular risk factors Continue to control mood as per Psychiatry  Migraine Headaches   Patient has a prior history of migraine headaches currently on Imitrex as needed, and on Aimovig 140 mg injection every 28 days, and Tylenol as needed, with great control.  Frequency is about 2 headaches a week.  Usually she has photophobia and nausea with these events, resolved when lying down.  She has a history of hypotension, so beta-blockers cannot be used. Continue Imitrex 100 mg at the first sign of headache. Continue Aimovig 140 mg injection q 28 days.  If BP elevates too high, will decrease the dose. Continue taking CoQ, magnesium as supplements.    Subjective:   This patient is here alone. Previous records as well as any outside records available were reviewed prior to todays visit.   Patient was last seen on 07/28/22     Any changes in memory since last visit? "I am doing well, I am back to myself, since I took myself of all those meds!". 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 Repeats oneself?  Endorsed Disoriented when walking into a room?  Patient denies   Leaving objects in unusual places?  Patient denies   Wandering behavior?   denies   Any personality changes since last visit?   denies   Any worsening depression?: denies. She is followed by Psychiatry. She is on Wellbutrin and Sertraline .   Hallucinations or paranoia?  denies   Seizures?   denies    Any sleep changes?   Not sleeping well, I wake up every 2 hrs, tossing and turning, now taking trazodone 50 mg, she is thinking about increasing it to for better sleep.. She reports vivid dreams, denies REM behavior or sleepwalking   Sleep apnea?   denies   Any hygiene concerns?   denies   Independent of bathing and dressing?  Endorsed  Does the patient needs help with medications? Patient is in charge   Who is in charge of the finances? Patient  is in charge     Any changes in appetite?  denies     Patient have trouble swallowing?  denies   Does the patient cook? Yes, no cooking accidents  Any headaches?  She has about 2 headaches a week, sometimes none.  She has to find a quiet area in the dark, without sounds, put sunglasses to rest, she may experience an aura with tunnel vision and flashes.  She has been taking Aimovig and Imitrex, sometimes with Tylenol with good results.  She has a history of chronic neck pain due to  C3 and C4 as well as C6-C7 DJD, followed by orthopedics, recently she received an injection in the neck, which last about 6 months "I need to go back to the doctor soon ".      Vision changes? denies Chronic back pain  denies   Ambulates with difficulty?   Denies  Recent falls or head injuries?  She sustained a left wrist fracture recently, requiring "zapping".  When she fell, she fell a little bit "dazed ", but did not have loss of consciousness.  CT of the head at the hospital was negative for acute findings, such as bleeding or fractures.    Unilateral weakness, numbness  or tingling?   denies   Any tremors?  She has functional dystonia. Last episode 1 month ago, for 1 week and ended.  May have been triggered by stress (her son left the house 7 years ago and never returned, his burst they had been in March, and that was a very stressful time for her) Any anosmia?    denies   Any incontinence of urine?  denies  " if laughing too hard" Any bowel dysfunction? Chronic constipation       Patient lives with spouse and her son who has Down syndrome Does the patient drive? Yes, denies any issues.   Initial Visit 06/15/21 The patient is seen in neurologic consultation at the request of Shelva Majestic, MD for the evaluation of memory.  The patient is accompanied by son who supplements the history.She is a 72 y.o. year old female who has had memory issues for about  5 years, especially thinking of recent information. She finds herself repeating the same stories and asking the same questions. Word finding games are more difficult. Her long term memory is good. Her son reports that recently, she has become more disoriented. For example, in [place], she got lost 3 times. She has noticed depressed mood. She carries a diagnosis of depression and has lost interest in activities such as word finding, exercising and socializing. Her son is very active in her care, but he does  admit that her husband "does everything for her, so she hardly does anything but does care for her Down syndrome son".  She reports a "lot of stress in my life". She is on 3 medications to help her sleep and for mood, including Zoloft, Gabapentin and Wellbutrin.     She sleeps from 11-6 am and reports "a hard time staying asleep. She admits to vivid dreams., denies sleepwalking. At night, she reports a person without a face, a female who passes her at the hallway. This event has been present for about 1 year. She also has some auditory hallucinations, hearing someone in the kitchen ( although son says he hears them  too). She feels "paranoid, worry about everything". Her son says that "she overreacts, stressed all the time, doubts herself from cooking to everything else". She is under the care of a therapist to help her cope, and she likes him. She denies leaving objects in unusual places. She is independent of dressing and bathing. She denies missing any meds doses. Her husband has always been in charge of the finances. Her appetite is good, denies trouble swallowing.  She cooks and denies leaving the stove on or the faucet on. Ambulates without a  walker or cane, but has been experiencing falls,  "unstable with the feet ", no loss of consciousness.  She is prone to fall if she is on the grass, but  not when she is on a parking lot. Had a appointment with Cardiology yesterday, not felt to be of cardiac etiology. She was not orthostatic.  Denies head injuries. She continues to drive but has gotten lost 3 times over the last week for 3 different appts.   Denies headaches, double vision,  she has intermittent dizziness,  denies focal numbness or tingling, unilateral weakness.  She has resting tremors when stressed. Denies urine incontinence or retention, constipation or diarrhea. Denies anosmia. Denies history of OSA, ETOH or Tobacco. Family History remarkable for mother and father with dementia. She is a former Engineer, civil (consulting), and is on disability due to chronic pain syndrome with constant neck. Bilateral shoulder tension. Was on Sinemet in the past due to tremors, " did not help". In today's visit, there is no head titubation.  She takes propanolol but the med makes her dizzier. She was seen for this and diagnosed with psychogenic movement disorder.    MRI brain wo contrast 06/06/21  Negative examination without acute abnormalities in the brainstem or cerebellum. No infarction, . Mild age related volume loss. No old or acute focal insult.     CT Angio Head W or Wo Contrast   Result Date: 06/06/2021  Brain: Mild age related volume  loss. No evidence of old or acute focal infarction, mass lesion, hemorrhage, hydrocephalus or extra-axial collection. Vascular: No abnormal vascular finding.    Negative CT angiography of the neck and head. No atherosclerotic disease at either carotid bifurcation. No intracranial large or medium vessel occlusion or correctable proximal stenosis. 18 mm nodule of the thyroid isthmus. Recommend thyroid USQuestion pericardial fluid in the superior recess. This is not definitely pathologic.       Labs 06/06/21  TSH 0.73 CBC normal CMP normal Lipid panel  normal  Past Medical History:  Diagnosis Date   Anemia    Arthritis    DJD, low back, thumb   Atrial fibrillation (HCC)    Xarelto anticoagulation. Flecainide antiarrythmic    Chicken pox    Chronic pain syndrome    On disability. History of bilateral hip pain, low back pain. Gabapentin 400 BID, percocet 1 tablet daily per prior provider.    Colon polyp    awaiting records   Depression    zoloft 100mg , remeron 30mg  per psychiatry. ambien 10mg  per psychiatry to help wtih sleep element.    Dysrhythmia    Dystonia    described as psychogenic dystonia. Pain and twisting from upper chest and up with triggers "wind, creamy food" on TID ativan per psychiatry previously.    Fibromyalgia    GERD (gastroesophageal reflux disease)    omeprazole OTC   Goiter    states multiple imaging tests, has had biopsies   Hyperlipidemia    lovastatin 20mg    Stroke (HCC)    TIA (left side of face and body decreased sensitivity than right face and side)   TIA (transient ischemic attack)    Vaginal atrophy    estrace vaginal cream     Past Surgical History:  Procedure Laterality Date   ATRIAL FIBRILLATION ABLATION     BIOPSY THYROID     fusion l4-l5  09/22/1998   GLUTEUS MINIMUS REPAIR Right 05/19/2022   Procedure: RIGHT HIP GLUTEUS MEDIUS TENDON REPAIR;  Surgeon: Huel Cote, MD;  Location: [facility] OR;  Service: Orthopedics;  Laterality: Right;    LAMINECTOMY     L4-L5   OTHER SURGICAL HISTORY     tennis elbow surgery   piriformis release  09/22/1997   right hip   TONSILLECTOMY AND ADENOIDECTOMY  age 41   VAGINAL HYSTERECTOMY  09/23/1991     PREVIOUS MEDICATIONS:   CURRENT MEDICATIONS:  Outpatient Encounter Medications as of 02/11/2023  Medication Sig   acetaminophen (TYLENOL) 500 MG tablet Take 1,000 mg by mouth every 6 (six) hours as needed for mild pain, moderate pain, fever or headache.   buPROPion (WELLBUTRIN XL) 150 MG 24 hr tablet Take 300 mg by mouth daily.   cephALEXin (KEFLEX) 250 MG capsule Take 250 mg by mouth daily.   Cholecalciferol (VITAMIN D) 125 MCG (5000 UT) CAPS Take 5,000 Units by mouth daily.   Coenzyme Q10 200 MG TABS Take 200 mg by mouth daily.   Cyanocobalamin (B-12 PO) Take 1 tablet by mouth daily.   Erenumab-aooe (AIMOVIG) 140 MG/ML SOAJ Inject 140 mg into the skin every 28 (twenty-eight) days.   flecainide (TAMBOCOR) 100 MG tablet Take 1 tablet by mouth twice daily   fluticasone (FLONASE) 50 MCG/ACT nasal spray Place 2 sprays into both nostrils daily.   LORazepam (ATIVAN) 0.5 MG tablet Take 1 tablet (0.5 mg total) by mouth daily as needed for anxiety (and dystonia).   Magnesium Oxide 420 MG TABS Take 420 mg by mouth daily.   melatonin 5 MG TABS Take 5 mg by mouth at bedtime.   Multiple Vitamin (MULTIVITAMIN WITH MINERALS) TABS tablet Take 1 tablet by mouth daily.   pantoprazole (PROTONIX) 20 MG tablet Take 1 tablet (20 mg total) by mouth 2 (two) times daily before a meal.   pyridOXINE (VITAMIN B6) 100 MG tablet Take 100 mg by mouth daily.   rosuvastatin (CRESTOR) 20 MG tablet Take 1 tablet by mouth once daily   senna-docusate (SENOKOT-S) 8.6-50 MG tablet Take 1 tablet by mouth daily.   SUMAtriptan (IMITREX) 100 MG tablet TAKE ONE TABLET BY MOUTH AT ONSET OF MIGRAINE FOR UP TO 1 DOSE   traZODone (DESYREL) 50 MG tablet TAKE 1 TABLET BY MOUTH AT BEDTIME AS NEEDED FOR SLEEP   XARELTO 20 MG TABS tablet  TAKE 1 TABLET BY MOUTH ONCE DAILY WITH SUPPER   DULoxetine (CYMBALTA) 60 MG capsule Take 60 mg by mouth daily.   HYDROcodone-acetaminophen (NORCO/VICODIN) 5-325 MG tablet Take 1 tablet by mouth every 6 (six) hours as needed for moderate pain.   lidocaine (LIDODERM) 5 % Place 1 patch onto the skin daily. Remove & Discard patch within 12 hours or as directed by MD   nitroGLYCERIN (NITRODUR - DOSED IN MG/24 HR) 0.2 mg/hr patch Place 1 patch (0.2 mg total) onto the skin daily. Cut the patch into 1/4ths --> place 1/4 patch over affected area, change every 24 hours   ondansetron (ZOFRAN ODT) 4 MG disintegrating tablet Take 1 tablet (4 mg total) by mouth every 8 (eight) hours as needed for nausea or vomiting. (Patient not taking: Reported on 02/11/2023)   No facility-administered encounter medications on file as of 02/11/2023.     Objective:     PHYSICAL EXAMINATION:    VITALS:   Vitals:   02/11/23 0840  BP: 109/60  Pulse: 87  Resp: 18  SpO2: 98%  Weight: 152 lb (68.9 kg)  Height: 5\' 8"  (1.727 m)    GEN:  The patient appears stated age and is in NAD. HEENT:  Normocephalic, atraumatic.   Neurological examination:  General: NAD, well-groomed, appears stated age. Orientation: The patient is alert. Oriented to person, place and date Cranial nerves: There is good facial symmetry.The speech is fluent and  clear. No aphasia or dysarthria. Fund of knowledge is appropriate. Recent memory impaired and remote memory is normal.  Attention and concentration are normal.  Able to name objects and repeat phrases.  Hearing is intact to conversational tone.   Delayed recall 3/3 Sensation: Sensation is intact to light touch throughout Motor: Strength is at least antigravity x4. Tremors: none  DTR's 2/4 in UE/LE      06/14/2021    8:00 AM  Montreal Cognitive Assessment   Visuospatial/ Executive (0/5) 3  Naming (0/3) 3  Attention: Read list of digits (0/2) 2  Attention: Read list of letters (0/1) 1   Attention: Serial 7 subtraction starting at 100 (0/3) 3  Language: Repeat phrase (0/2) 1  Language : Fluency (0/1) 0  Abstraction (0/2) 1  Delayed Recall (0/5) 2  Orientation (0/6) 6  Total 22       02/11/2023    9:00 AM  MMSE - Mini Mental State Exam  Orientation to time 5  Orientation to Place 5  Registration 3  Attention/ Calculation 5  Recall 3  Language- name 2 objects 2  Language- repeat 1  Language- follow 3 step command 3  Language- read & follow direction 1  Write a sentence 1  Copy design 1  Total score 30       Movement examination: Tone: There is normal tone in the UE/LE Abnormal movements:  no tremor.  No myoclonus.  No asterixis.   Coordination:  There is no decremation with RAM's. Normal finger to nose  Gait and Station: The patient has no difficulty arising out of a deep-seated chair without the use of the hands. The patient's stride length is good.  Gait is cautious and narrow.   Thank you for allowing [place] the opportunity to participate in the care of this nice patient. Please do not hesitate to contact us for any questions or concerns.   Total time spent on today's visit was 37 minutes dedicated to this patient today, preparing to see patient, examining the patient, ordering tests and/or medications and counseling the patient, documenting clinical information in the EHR or other health record, independently interpreting results and communicating results to the patient/family, discussing treatment and goals, answering patient's questions and coordinating care.  Cc:  Shelva Majestic, MD  Marlowe Kays 02/11/2023 9:20 AM

## 2023-02-11 NOTE — Patient Instructions (Addendum)
It was a pleasure to see you today at our office.   Recommendations:  Follow up in  6 months Continue Aimovig 140 mg injection q 28 days. If BP elevates too high, will decrease the dose. Continue Imitrex as prescribed   RECOMMENDATIONS FOR ALL PATIENTS WITH MEMORY PROBLEMS: 1. Continue to exercise (Recommend 30 minutes of walking everyday, or 3 hours every week) 2. Increase social interactions - continue going to [place] and enjoy social gatherings with friends and family 3. Eat healthy, avoid fried foods and eat more fruits and vegetables 4. Maintain adequate blood pressure, blood sugar, and blood cholesterol level. Reducing the risk of stroke and cardiovascular disease also helps promoting better memory. 5. Avoid stressful situations. Live a simple life and avoid aggravations. Organize your time and prepare for the next day in anticipation. 6. Sleep well, avoid any interruptions of sleep and avoid any distractions in the bedroom that may interfere with adequate sleep quality 7. Avoid sugar, avoid sweets as there is a strong link between excessive sugar intake, diabetes, and cognitive impairment We discussed the Mediterranean diet, which has been shown to help patients reduce the risk of progressive memory disorders and reduces cardiovascular risk. This includes eating fish, eat fruits and green leafy vegetables, nuts like almonds and hazelnuts, walnuts, and also use olive oil. Avoid fast foods and fried foods as much as possible. Avoid sweets and sugar as sugar use has been linked to worsening of memory function.  There is always a concern of gradual progression of memory problems. If this is the case, then we may need to adjust level of care according to patient needs. Support, both to the patient and caregiver, should then be put into place.    FALL PRECAUTIONS: Be cautious when walking. Scan the area for obstacles that may increase the risk of trips and falls. When getting up in the  mornings, sit up at the edge of the bed for a few minutes before getting out of bed. Consider elevating the bed at the head end to avoid drop of blood pressure when getting up. Walk always in a well-lit room (use night lights in the walls). Avoid area rugs or power cords from appliances in the middle of the walkways. Use a walker or a cane if necessary and consider physical therapy for balance exercise. Get your eyesight checked regularly.  FINANCIAL OVERSIGHT: Supervision, especially oversight when making financial decisions or transactions is also recommended.  HOME SAFETY: Consider the safety of the kitchen when operating appliances like stoves, microwave oven, and blender. Consider having supervision and share cooking responsibilities until no longer able to participate in those. Accidents with firearms and other hazards in the house should be identified and addressed as well.   ABILITY TO BE LEFT ALONE: If patient is unable to contact 911 operator, consider using LifeLine, or when the need is there, arrange for someone to stay with patients. Smoking is a fire hazard, consider supervision or cessation. Risk of wandering should be assessed by caregiver and if detected at any point, supervision and safe proof recommendations should be instituted.  MEDICATION SUPERVISION: Inability to self-administer medication needs to be constantly addressed. Implement a mechanism to ensure safe administration of the medications.   DRIVING: Regarding driving, in patients with progressive memory problems, driving will be impaired. We advise to have someone else do the driving if trouble finding directions or if minor accidents are reported. Independent driving assessment is available to determine safety of driving.   If you  are interested in the driving assessment, you can contact the following:  [employer] in [place] [phone]  [facility] [phone]  [facility] [phone]  [facility] [phone] or [phone]  Limit use of pain relievers to no more than 2 days out of the week.  These medications include acetaminophen, NSAIDs (ibuprofen/Advil/Motrin, naproxen/Aleve, triptans (Imitrex/sumatriptan), Excedrin, and narcotics.  This will help reduce risk of rebound headaches. Be aware of common food triggers:  - Caffeine:  coffee, black tea, cola, Mt. Dew  - Chocolate  - Dairy:  aged cheeses (brie, blue, cheddar, gouda, [place], provolone, [place], Swiss, etc), chocolate milk, buttermilk, sour cream, limit eggs and yogurt  - Nuts, peanut butter  - Alcohol  - Cereals/grains:  FRESH breads (fresh bagels, sourdough, doughnuts), yeast productions  - Processed/canned/aged/cured meats (pre-packaged deli meats, hotdogs)  - MSG/glutamate:  soy sauce, flavor enhancer, pickled/preserved/marinated foods  - Sweeteners:  aspartame (Equal, Nutrasweet).  Sugar and Splenda are okay  - Vegetables:  legumes (lima beans, lentils, snow peas, fava beans, pinto peans, peas, garbanzo beans), sauerkraut, onions, olives, pickles  - Fruit:  avocados, bananas, citrus fruit (orange, lemon, grapefruit), mango  - Other:  Frozen meals, macaroni and cheese Routine exercise Stay adequately hydrated (aim for 64 oz water daily) Keep headache diary Maintain proper stress management Maintain proper sleep hygiene Do not skip meals Consider supplements:  magnesium citrate 400mg  daily, riboflavin 400mg  daily, coenzyme Q10 100mg  three times daily.

## 2023-02-12 ENCOUNTER — Encounter: Payer: Self-pay | Admitting: Physical Medicine and Rehabilitation

## 2023-02-12 ENCOUNTER — Encounter: Payer: Self-pay | Admitting: Sports Medicine

## 2023-02-12 ENCOUNTER — Other Ambulatory Visit (INDEPENDENT_AMBULATORY_CARE_PROVIDER_SITE_OTHER): Payer: Medicare HMO

## 2023-02-12 ENCOUNTER — Ambulatory Visit (INDEPENDENT_AMBULATORY_CARE_PROVIDER_SITE_OTHER): Payer: Medicare HMO | Admitting: Sports Medicine

## 2023-02-12 DIAGNOSIS — M25551 Pain in right hip: Secondary | ICD-10-CM

## 2023-02-12 DIAGNOSIS — S62112D Displaced fracture of triquetrum [cuneiform] bone, left wrist, subsequent encounter for fracture with routine healing: Secondary | ICD-10-CM

## 2023-02-12 DIAGNOSIS — M25552 Pain in left hip: Secondary | ICD-10-CM | POA: Diagnosis not present

## 2023-02-12 DIAGNOSIS — M25532 Pain in left wrist: Secondary | ICD-10-CM

## 2023-02-12 NOTE — Progress Notes (Signed)
Danielle Rocha - 72 y.o. female MRN [id number]  Date of birth: 11/23/1950  Office Visit Note: Visit Date: 02/12/2023 PCP: Shelva Majestic, MD Referred by: Shelva Majestic, MD  Subjective: Chief Complaint  Patient presents with   Left Wrist - Pain   HPI: Danielle Rocha is a pleasant 72 y.o. female who presents today for follow-up of left wrist, triquetrium fracture.  Roby had a fall 2 weeks ago, was placed into a wrist brace initially and I transitioned her to a short arm cast on 02/02/2023.  The area where her fracture occurred has absolutely no pain in the cast.  She is having some rubbing of the cast near her thumb and over the dorsum of her third MCP.  Still has good sensation.  Also having worsening of her bilateral hip pain.  These are acute on chronic issues for her.  Right hip is status post right gluteus medius repair about 9 months ago.  Pertinent ROS were reviewed with the patient and found to be negative unless otherwise specified above in HPI.   Assessment & Plan: Visit Diagnoses:  1. Closed displaced fracture of triquetrum of left wrist with routine healing, subsequent encounter   2. Pain in left wrist   3. Bilateral hip pain    Plan: Discussed with Shayde that her triquetral fracture looks stable.  She was having some discomfort with the initial short arm cast, we did replace a new 1 today which she found comfortable.  We will bring her back between the 4 to 6-week mark from her cast application on 02/02/2023.  She is also having bilateral lateral hip pain -we will see her next week for evaluation and treatment.  Could consider ultrasound-guided greater trochanteric injection.  Discussed the role of possibly sending her to chronic pain management given this has bothered her for a prolonged period of time.  She is agreeable to this.  Follow-up: next week for eval and treatment of bilateral lateral hip pain   Meds & Orders: No orders of the defined types were placed  in this encounter.   Orders Placed This Encounter  Procedures   XR Wrist Complete Left     Procedures: No procedures performed      Clinical History:   She reports that she has never smoked. She has never used smokeless tobacco. No results for input(s): "HGBA1C", "LABURIC" in the last 8760 hours.  Objective:   Vital Signs: LMP  (LMP Unknown)   Physical Exam  Gen: Well-appearing, in no acute distress; non-toxic CV: Regular Rate. Well-perfused. Warm.  Resp: Breathing unlabored on room air; no wheezing. Psych: Fluid speech in conversation; appropriate affect; normal thought process Neuro: Sensation intact throughout. No gross coordination deficits.   Ortho Exam - Left wrist: Wrist evaluated in and out of the cast today.  So some mild TTP palpating over the dorsal aspect of the triquetrum.  Patient is able to perform wrist flexion and extension, extension does reproduce some pain.  Neurovascular intact.  Good sensation with fingers in cast.  Imaging: XR Wrist Complete Left  Result Date: 02/12/2023 3 views of the left wrist including AP, oblique and lateral film were ordered and reviewed by myself.  X-rays show redemonstration of a mildly displaced triquetral fracture, best seen on lateral film.  There is is no further displacement noted compared to previous x-rays, there is evidence of early callus formation.  There is a small degree of age-indeterminate cortical irregularity over the ulnar aspect of the lunate, will  continue to monitor.   Past Medical/Family/Surgical/Social History: Medications & Allergies reviewed per EMR, new medications updated. Patient Active Problem List   Diagnosis Date Noted   Seasonal allergic rhinitis due to pollen 01/01/2023   Tear of right gluteus medius tendon    Involuntary movements 02/14/2020   Aortic atherosclerosis (HCC) 02/07/2020   Delayed gastric emptying 02/07/2020   Dystonia    Restless legs 07/27/2018   Recurrent UTI 10/07/2016   Major  depression in full remission (HCC) 09/02/2016   GERD (gastroesophageal reflux disease) 01/30/2016   History of SI/intentional drug overdose 11/22/2015   Atrial fibrillation (HCC)    Chronic pain syndrome    Hyperlipidemia    Past Medical History:  Diagnosis Date   Anemia    Arthritis    DJD, low back, thumb   Atrial fibrillation (HCC)    Xarelto anticoagulation. Flecainide antiarrythmic    Chicken pox    Chronic pain syndrome    On disability. History of bilateral hip pain, low back pain. Gabapentin 400 BID, percocet 1 tablet daily per prior provider.    Colon polyp    awaiting records   Depression    zoloft 100mg , remeron 30mg  per psychiatry. ambien 10mg  per psychiatry to help wtih sleep element.    Dysrhythmia    Dystonia    described as psychogenic dystonia. Pain and twisting from upper chest and up with triggers "wind, creamy food" on TID ativan per psychiatry previously.    Fibromyalgia    GERD (gastroesophageal reflux disease)    omeprazole OTC   Goiter    states multiple imaging tests, has had biopsies   Hyperlipidemia    lovastatin 20mg    Stroke (HCC)    TIA (left side of face and body decreased sensitivity than right face and side)   TIA (transient ischemic attack)    Vaginal atrophy    estrace vaginal cream   Family History  Problem Relation Age of Onset   Alcohol abuse Mother        possible she had stomach cancer as well but unsure   Hypertension Father    Alcohol abuse Father    Pancreatic cancer Father    Cancer Brother        spindle cell RLE   Diabetes Brother    Cancer Brother        tonsil cancer   Skin cancer Brother    Colon cancer Neg Hx    Colon polyps Neg Hx    Stomach cancer Neg Hx    Past Surgical History:  Procedure Laterality Date   ATRIAL FIBRILLATION ABLATION     BIOPSY THYROID     fusion l4-l5  09/22/1998   GLUTEUS MINIMUS REPAIR Right 05/19/2022   Procedure: RIGHT HIP GLUTEUS MEDIUS TENDON REPAIR;  Surgeon: Huel Cote, MD;   Location: [facility] OR;  Service: Orthopedics;  Laterality: Right;   LAMINECTOMY     L4-L5   OTHER SURGICAL HISTORY     tennis elbow surgery   piriformis release  09/22/1997   right hip   TONSILLECTOMY AND ADENOIDECTOMY  age 74   VAGINAL HYSTERECTOMY  09/23/1991   Social History   Occupational History   Occupation: retired  Tobacco Use   Smoking status: Never   Smokeless tobacco: Never  Vaping Use   Vaping Use: Never used  Substance and Sexual Activity   Alcohol use: No    Alcohol/week: 0.0 standard drinks of alcohol   Drug use: No   Sexual activity: Not Currently  Partners: Male

## 2023-02-13 ENCOUNTER — Telehealth: Payer: Self-pay

## 2023-02-13 DIAGNOSIS — M5412 Radiculopathy, cervical region: Secondary | ICD-10-CM

## 2023-02-13 NOTE — Telephone Encounter (Signed)
Order for repeat right C7-T1 interlaminar epidural steroid injection placed

## 2023-02-18 ENCOUNTER — Other Ambulatory Visit: Payer: Self-pay | Admitting: Sports Medicine

## 2023-02-18 ENCOUNTER — Ambulatory Visit: Payer: Medicare HMO | Admitting: Sports Medicine

## 2023-02-18 DIAGNOSIS — G894 Chronic pain syndrome: Secondary | ICD-10-CM

## 2023-02-19 ENCOUNTER — Ambulatory Visit: Payer: Medicare HMO | Admitting: Sports Medicine

## 2023-02-19 ENCOUNTER — Other Ambulatory Visit: Payer: Self-pay

## 2023-02-19 ENCOUNTER — Encounter: Payer: Self-pay | Admitting: Sports Medicine

## 2023-02-19 DIAGNOSIS — S62112D Displaced fracture of triquetrum [cuneiform] bone, left wrist, subsequent encounter for fracture with routine healing: Secondary | ICD-10-CM | POA: Diagnosis not present

## 2023-02-19 DIAGNOSIS — M25551 Pain in right hip: Secondary | ICD-10-CM | POA: Diagnosis not present

## 2023-02-19 DIAGNOSIS — M25552 Pain in left hip: Secondary | ICD-10-CM

## 2023-02-19 DIAGNOSIS — M7062 Trochanteric bursitis, left hip: Secondary | ICD-10-CM | POA: Diagnosis not present

## 2023-02-19 MED ORDER — LIDOCAINE HCL 1 % IJ SOLN
2.0000 mL | INTRAMUSCULAR | Status: AC | PRN
  Administered 2023-02-19: 2 mL

## 2023-02-19 MED ORDER — BUPIVACAINE HCL 0.25 % IJ SOLN
2.0000 mL | INTRAMUSCULAR | Status: AC | PRN
  Administered 2023-02-19: 2 mL via INTRA_ARTICULAR

## 2023-02-19 MED ORDER — BETAMETHASONE SOD PHOS & ACET 6 (3-3) MG/ML IJ SUSP
6.0000 mg | INTRAMUSCULAR | Status: AC | PRN
  Administered 2023-02-19: 6 mg via INTRA_ARTICULAR

## 2023-02-19 NOTE — Progress Notes (Signed)
Danielle Rocha - 72 y.o. female MRN [id number]  Date of birth: Oct 08, 1950  Office Visit Note: Visit Date: 02/19/2023 PCP: Shelva Majestic, MD Referred by: Shelva Majestic, MD  Subjective: Chief Complaint  Patient presents with   Right Hip - Pain   Left Hip - Pain   HPI: Danielle Rocha is a pleasant 72 y.o. female who presents today for bilateral hip pain.  History of chronic lateral hip pain with prior piriformis release in status post right gluteus medius repair almost 10 months ago by my partner Dr. Steward Drone.  Had been doing ESWT, using nitroglycerin patch, home therapy with good relief of her pain.  However over the last few weeks she has an exacerbation of both of her lateral hips.  She had previously taken a trip and has been doing a lot of walking, this may have flared things up.  She is feeling quite miserable because of the pain.  This is affecting her sleep and her daily activities.  She is advised to take Tylenol twice weekly but is not able to take it more given her history of migraines per her neurologist recommendation.  At last visit, we did send a referral to chronic pain management so that they can help guide pharmacological treatment for her chronic hip pain.  Her wrist/triquetral fracture is doing great.  No issues with her cast.  She currently has no pain.  Pertinent ROS were reviewed with the patient and found to be negative unless otherwise specified above in HPI.   Assessment & Plan: Visit Diagnoses:  1. Bilateral hip pain   2. Greater trochanteric pain syndrome of both lower extremities   3. Greater trochanteric bursitis of left hip   4. Closed displaced fracture of triquetrum of left wrist with routine healing, subsequent encounter    Plan: Discussed treatment options for her bilateral lateral hips, her right hip has been a chronic issue for her in the past and is about 10 months out from a gluteus medius repair.  The left hip has also been bothering her  quite noticeably, affecting her sleep and activities of daily living.  Through shared decision-making, elected to proceed with bilateral greater trochanteric injections, patient tolerated well.  Given that she is 3 months out from the nitroglycerin patch 0.2mg /hr, we will discontinue this today.  Would like her to rest and have activity modification through this week, as her pain improves she may start back on her home exercises for both of her lateral hips on Monday.  We did send a referral to chronic pain management given that her right hip pain has been a very chronic issue for her.  This is currently in process.  She does have a short course of Norco 5-325 mg that she can take only as needed for breakthrough pain for the wrist fracture.  In her cast, we will follow-up in about 2 and half-3 weeks for repeat x-ray of the triquetral fracture.  She had received fairly good relief previously from extracorporeal shockwave therapy for the lateral hip.  Will treat these with injections but we will revisit the hips to see if this is something that she would like to continue in the future.  She has discontinued Cymbalta, currently only taking trazodone 50 mg nightly.  Discussed the role of a low-dose Elavil in the future.  Follow-up: Return in about 18 days (around 03/09/2023) for 30-mins for wrist fracture f/u and bilateral hips.   Meds & Orders: No orders of the defined  types were placed in this encounter.   Orders Placed This Encounter  Procedures   Large Joint Inj: R greater trochanter   Large Joint Inj: L greater trochanter   XR HIPS BILAT W OR W/O PELVIS 3-4 VIEWS   US Guided Needle Placement - No Linked Charges     Procedures: Large Joint Inj: R greater trochanter on 02/19/2023 10:19 AM Indications: pain Details: 22 G 3.5 in needle, ultrasound-guided lateral approach Medications: 2 mL lidocaine 1 %; 2 mL bupivacaine 0.25 %; 6 mg betamethasone acetate-betamethasone sodium phosphate 6 (3-3)  MG/ML Outcome: tolerated well, no immediate complications  US-Guided Greater Trochanteric Bursa Injection, right After discussion on risks/benefits/indications and informed verbal consent was obtained, a timeout was performed. The patient was lying in lateral recumbent position on exam table. Using ultrasound guidance, the greater trochanter was identified. The area overlying the trochanteric bursa was then prepped with Betadine and alcohol swabs. Following sterile precautions, ultrasound was reapplied to visualize needle guidance with a 22-gauge 3.5" needle utilizing an in-plane approach to inject the bursa with 2:2:1 lidocaine:bupivicaine:betamethasone. Delivery of the injectate was visualized into the region of hypoechoic fluid of the greater trochanteric bursa. Patient tolerated procedure well without immediate complications.    Procedure, treatment alternatives, risks and benefits explained, specific risks discussed. Consent was given by the patient. Immediately prior to procedure a time out was called to verify the correct patient, procedure, equipment, support staff and site/side marked as required. Patient was prepped and draped in the usual sterile fashion.    Large Joint Inj: L greater trochanter on 02/19/2023 10:20 AM Indications: pain Details: 22 G 3.5 in needle, ultrasound-guided lateral approach Medications: 2 mL lidocaine 1 %; 2 mL bupivacaine 0.25 %; 6 mg betamethasone acetate-betamethasone sodium phosphate 6 (3-3) MG/ML Outcome: tolerated well, no immediate complications  US-Guided Greater Trochanteric Bursa Injection, Left After discussion on risks/benefits/indications and informed verbal consent was obtained, a timeout was performed. The patient was lying in lateral recumbent position on exam table. Using ultrasound guidance, the greater trochanter was identified. The area overlying the trochanteric bursa was then prepped with Betadine and alcohol swabs. Following sterile  precautions, ultrasound was reapplied to visualize needle guidance with a 22-gauge 3.5" needle utilizing an in-plane approach to inject the bursa with 2:2:1 lidocaine:bupivicaine:betamethasone. Delivery of the injectate was visualized into the region of hypoechoic fluid of the greater trochanteric bursa. Patient tolerated procedure well without immediate complications.    Procedure, treatment alternatives, risks and benefits explained, specific risks discussed. Consent was given by the patient. Immediately prior to procedure a time out was called to verify the correct patient, procedure, equipment, support staff and site/side marked as required. Patient was prepped and draped in the usual sterile fashion.          Clinical History:   She reports that she has never smoked. She has never used smokeless tobacco. No results for input(s): "HGBA1C", "LABURIC" in the last 8760 hours.  Objective:   Vital Signs: LMP  (LMP Unknown)   Physical Exam  Gen: Well-appearing, in no acute distress; non-toxic CV: Well-perfused. Warm.  Resp: Breathing unlabored on room air; no wheezing. Psych: Fluid speech in conversation; appropriate affect; normal thought process Neuro: Sensation intact throughout. No gross coordination deficits.   Ortho Exam - Bilateral hips: Positive TTP over bilateral greater trochanteric region.  The right hip has well-healed incisions from her prior surgery.  There is pain and associated weakness with resisted hip abduction.  There is no gross restriction with internal  or external logroll.  - Left wrist: Cast is in place, able to wiggle all 5 fingers without difficulty.  Cap refill less than 2 seconds.  Imaging:  Previous x-ray: XR Wrist Complete Left 3 views of the left wrist including AP, oblique and lateral film were  ordered and reviewed by myself.  X-rays show redemonstration of a mildly  displaced triquetral fracture, best seen on lateral film.  There is is no  further  displacement noted compared to previous x-rays, there is evidence  of early callus formation.  There is a small degree of age-indeterminate  cortical irregularity over the ulnar aspect of the lunate, will continue  to monitor.    Past Medical/Family/Surgical/Social History: Medications & Allergies reviewed per EMR, new medications updated. Patient Active Problem List   Diagnosis Date Noted   Seasonal allergic rhinitis due to pollen 01/01/2023   Tear of right gluteus medius tendon    Involuntary movements 02/14/2020   Aortic atherosclerosis (HCC) 02/07/2020   Delayed gastric emptying 02/07/2020   Dystonia    Restless legs 07/27/2018   Recurrent UTI 10/07/2016   Major depression in full remission (HCC) 09/02/2016   GERD (gastroesophageal reflux disease) 01/30/2016   History of SI/intentional drug overdose 11/22/2015   Atrial fibrillation (HCC)    Chronic pain syndrome    Hyperlipidemia    Past Medical History:  Diagnosis Date   Anemia    Arthritis    DJD, low back, thumb   Atrial fibrillation (HCC)    Xarelto anticoagulation. Flecainide antiarrythmic    Chicken pox    Chronic pain syndrome    On disability. History of bilateral hip pain, low back pain. Gabapentin 400 BID, percocet 1 tablet daily per prior provider.    Colon polyp    awaiting records   Depression    zoloft 100mg , remeron 30mg  per psychiatry. ambien 10mg  per psychiatry to help wtih sleep element.    Dysrhythmia    Dystonia    described as psychogenic dystonia. Pain and twisting from upper chest and up with triggers "wind, creamy food" on TID ativan per psychiatry previously.    Fibromyalgia    GERD (gastroesophageal reflux disease)    omeprazole OTC   Goiter    states multiple imaging tests, has had biopsies   Hyperlipidemia    lovastatin 20mg    Stroke (HCC)    TIA (left side of face and body decreased sensitivity than right face and side)   TIA (transient ischemic attack)    Vaginal atrophy     estrace vaginal cream   Family History  Problem Relation Age of Onset   Alcohol abuse Mother        possible she had stomach cancer as well but unsure   Hypertension Father    Alcohol abuse Father    Pancreatic cancer Father    Cancer Brother        spindle cell RLE   Diabetes Brother    Cancer Brother        tonsil cancer   Skin cancer Brother    Colon cancer Neg Hx    Colon polyps Neg Hx    Stomach cancer Neg Hx    Past Surgical History:  Procedure Laterality Date   ATRIAL FIBRILLATION ABLATION     BIOPSY THYROID     fusion l4-l5  09/22/1998   GLUTEUS MINIMUS REPAIR Right 05/19/2022   Procedure: RIGHT HIP GLUTEUS MEDIUS TENDON REPAIR;  Surgeon: Huel Cote, MD;  Location: [facility] OR;  Service: Orthopedics;  Laterality: Right;   LAMINECTOMY     L4-L5   OTHER SURGICAL HISTORY     tennis elbow surgery   piriformis release  09/22/1997   right hip   TONSILLECTOMY AND ADENOIDECTOMY  age 79   VAGINAL HYSTERECTOMY  09/23/1991   Social History   Occupational History   Occupation: retired  Tobacco Use   Smoking status: Never   Smokeless tobacco: Never  Vaping Use   Vaping Use: Never used  Substance and Sexual Activity   Alcohol use: No    Alcohol/week: 0.0 standard drinks of alcohol   Drug use: No   Sexual activity: Not Currently    Partners: Male

## 2023-02-19 NOTE — Progress Notes (Signed)
Bilateral hip pain Right worse than left Tylenol only 2x/week  Both groin/lateral hip pain Pain into legs; but denies numbness/tingling

## 2023-02-23 ENCOUNTER — Encounter: Payer: Self-pay | Admitting: Family Medicine

## 2023-02-23 ENCOUNTER — Ambulatory Visit (INDEPENDENT_AMBULATORY_CARE_PROVIDER_SITE_OTHER): Payer: Medicare HMO | Admitting: Family Medicine

## 2023-02-23 VITALS — BP 120/72 | HR 56 | Temp 97.2°F | Ht 68.0 in | Wt 150.0 lb

## 2023-02-23 DIAGNOSIS — R030 Elevated blood-pressure reading, without diagnosis of hypertension: Secondary | ICD-10-CM

## 2023-02-23 DIAGNOSIS — I700 Atherosclerosis of aorta: Secondary | ICD-10-CM

## 2023-02-23 DIAGNOSIS — F325 Major depressive disorder, single episode, in full remission: Secondary | ICD-10-CM

## 2023-02-23 DIAGNOSIS — E785 Hyperlipidemia, unspecified: Secondary | ICD-10-CM

## 2023-02-23 DIAGNOSIS — R053 Chronic cough: Secondary | ICD-10-CM | POA: Diagnosis not present

## 2023-02-23 NOTE — Progress Notes (Signed)
Phone [phone] In person visit   Subjective:   Danielle Rocha is a 72 y.o. year old very pleasant female patient who presents for/with See problem oriented charting Chief Complaint  Patient presents with   Hypertension    Pt c/o increased bp reading over the past few days with headaches.   Past Medical History-  Patient Active Problem List   Diagnosis Date Noted   Dystonia     Priority: High   Major depression in full remission (HCC) 09/02/2016    Priority: High   History of SI/intentional drug overdose 11/22/2015    Priority: High   Atrial fibrillation (HCC)     Priority: High   Chronic pain syndrome     Priority: High   Seasonal allergic rhinitis due to pollen 01/01/2023    Priority: Medium    Aortic atherosclerosis (HCC) 02/07/2020    Priority: Medium    Delayed gastric emptying 02/07/2020    Priority: Medium    Restless legs 07/27/2018    Priority: Medium    Recurrent UTI 10/07/2016    Priority: Medium    Hyperlipidemia     Priority: Medium    GERD (gastroesophageal reflux disease) 01/30/2016    Priority: Low   Tear of right gluteus medius tendon    Involuntary movements 02/14/2020    Medications- reviewed and updated Current Outpatient Medications  Medication Sig Dispense Refill   acetaminophen (TYLENOL) 500 MG tablet Take 1,000 mg by mouth every 6 (six) hours as needed for mild pain, moderate pain, fever or headache.     buPROPion (WELLBUTRIN XL) 300 MG 24 hr tablet Take 300 mg by mouth daily. Through psychiatry     cephALEXin (KEFLEX) 250 MG capsule Take 250 mg by mouth daily.     Cholecalciferol (VITAMIN D) 125 MCG (5000 UT) CAPS Take 5,000 Units by mouth daily.     Coenzyme Q10 200 MG TABS Take 200 mg by mouth daily.     Cyanocobalamin (B-12 PO) Take 1 tablet by mouth daily.     Erenumab-aooe (AIMOVIG) 140 MG/ML SOAJ Inject 140 mg into the skin every 28 (twenty-eight) days. 1.12 mL 11   flecainide (TAMBOCOR) 100 MG tablet Take 1 tablet by mouth  twice daily 180 tablet 0   fluticasone (FLONASE) 50 MCG/ACT nasal spray Place 2 sprays into both nostrils daily. 16 g 3   LORazepam (ATIVAN) 0.5 MG tablet Take 1 tablet (0.5 mg total) by mouth daily as needed for anxiety (and dystonia). 10 tablet 0   Magnesium Oxide 420 MG TABS Take 420 mg by mouth daily.     melatonin 5 MG TABS Take 5 mg by mouth at bedtime.     Multiple Vitamin (MULTIVITAMIN WITH MINERALS) TABS tablet Take 1 tablet by mouth daily.     pantoprazole (PROTONIX) 20 MG tablet Take 1 tablet (20 mg total) by mouth 2 (two) times daily before a meal. 180 tablet 3   pyridOXINE (VITAMIN B6) 100 MG tablet Take 100 mg by mouth daily.     rosuvastatin (CRESTOR) 20 MG tablet Take 1 tablet by mouth once daily 90 tablet 0   senna-docusate (SENOKOT-S) 8.6-50 MG tablet Take 1 tablet by mouth daily. 30 tablet 0   sertraline (ZOLOFT) 50 MG tablet Take 50 mg by mouth in the morning and at bedtime. Through psychiatry     SUMAtriptan (IMITREX) 100 MG tablet TAKE ONE TABLET BY MOUTH AT ONSET OF MIGRAINE FOR UP TO 1 DOSE 10 tablet 5   traZODone (DESYREL) 50  MG tablet TAKE 1 TABLET BY MOUTH AT BEDTIME AS NEEDED FOR SLEEP 30 tablet 0   XARELTO 20 MG TABS tablet TAKE 1 TABLET BY MOUTH ONCE DAILY WITH SUPPER 90 tablet 0   ondansetron (ZOFRAN ODT) 4 MG disintegrating tablet Take 1 tablet (4 mg total) by mouth every 8 (eight) hours as needed for nausea or vomiting. (Patient not taking: Reported on 02/23/2023) 20 tablet 0   No current facility-administered medications for this visit.     Objective:  BP 120/72   Pulse (!) 56   Temp (!) 97.2 F (36.2 C)   Ht 5\' 8"  (1.727 m)   Wt 150 lb (68 kg)   LMP  (LMP Unknown)   SpO2 96%   BMI 22.81 kg/m  Gen: NAD, resting comfortably CV: RRR no murmurs rubs or gallops Lungs: CTAB no crackles, wheeze, rhonchi Abdomen: soft/nontender/nondistended/normal bowel sounds. No rebound or guarding.  Ext: trace edema Skin: warm, dry    Assessment and Plan    #Musculoskeletal issues- may 10th or 11th broke her wrist and her rib- rib and wrist seem to be healing well. Later fell and tweaked her left ankle. The walkway into a practice was not easy to navigate- feels she needs to slow down- is paying more attention now.  -hip issues in general have been better until the fall- has had recent injectoin - some diffuse arthralgias but present before fall. No morning stiffness- worse with activity  #elevated blood pressure readings S: medication: none -with neurology pressure had been up to 180. She is not sure why but #s were up in offices (I cannot find all these readings) as well as at home into 140's or 150's but has steadily come back down- did not feel like in particularly worse pain than usually. Was not taking NSAIDs with ongoing xarelto. Did not have more headache(s) than usual. No blurry vision. Did note some dizziness with it- has improved as blood pressure improved BP Readings from Last 3 Encounters:  02/23/23 120/72  02/11/23 109/60  01/31/23 (!) 151/61  A/P: blood pressure  was elevated at home and in some offices- unclear what caused increase but has regulated again- we agreed to monitor - she will message me if sees increase agin  # Chronic cough S: Issues ongoing since at least 11/21/2022.  Had some improvement on antihistamine before bed and Flonase but still with persistent cough.  Previously had discussed referral to pulmonology but she wanted to hold off at last visit -already on proton pump inhibitor (PPI) stomach acid reducer  -at that time had worsening cough for 3 weeks but reports cough ongoing for over a year- was even prior to getting new cat (still has cat) - when has traveled away from cat still had cough -does feel some shortness of breath all the time as well- still able to garden but takes a lot of breaks, gets shortness of breath walking into the home. Denies high heart rate with these episodes. No chest pain with episodes.  Some worsening over time with shortness of breath. No calf pain or tenderness or edema -we did CXR on 11/25/22 which was reassuring. She later had CT of her chest with her fall- with results as follows "IMPRESSION: 1. Probable nondisplaced acute fracture of the anterolateral left fourth rib. 2. Sternal deformity may be related to remote healed fracture. 3. 15 mm low-density nodule in the thyroid isthmus stable since study from more than 7 years ago. No followup imaging is recommended. 4.  Aortic Atherosclerosis (ICD10-I70.0)."  Today she reports ongoing cough - has not seen further improvement. Last visti we held off as she wanted dystonia to calm down first but that is now better and willing to see pulmonology now A/P: with chornic cough well over year duration refer to pulmonology. Largely reassuring chest CT within a month, already on proton pump inhibitor (PPI) stomach acid reducer, already on allergy medicine- we will get their opinion  - discussed may be Dr. Sherene Sires -since she also has some shortness of breath encouraged a visit with Dr. Tenny Craw as well who she saw in the past -CBC and CMP normal in march when was feeling this way still- both cough and shortness of breath  #Dystonia- doing better in 2024 about a year better- on and off in the past- last visit had flare but has thankfully has not even needed the ativan but has on hand if needed.    #Major depressive disorder S: currently on sertraline 50 mg twice daily (Cymbalta made her feel off)  and wellbutrin 300 XL in the AM as well. trazodone 50 mg before bed  working with psychiatry Dr. Donell Beers.     02/23/2023    7:59 AM 11/21/2022    2:31 PM 09/29/2022    3:14 PM  Depression screen PHQ 2/9  Decreased Interest 0 0 0  Down, Depressed, Hopeless 0 0 0  PHQ - 2 Score 0 0 0  Altered sleeping 1 2 0  Tired, decreased energy 0 2 0  Change in appetite 0 0 0  Feeling bad or failure about yourself  0 0 0  Trouble concentrating 0 0 0  Moving  slowly or fidgety/restless 0 0 0  Suicidal thoughts 0 0 0  PHQ-9 Score 1 4 0  Difficult doing work/chores Not difficult at all Not difficult at all Not difficult at all  A/P: full remission- continue current medications    #Hyperlipidemia/aortic atherosclerosis- incidental finding on 11/04/19 CT S:compliant rosuvastatin 20mg . LDL goal at least under 100 prefer under 70. She wants to work on lifestyle as much as able Lab Results  Component Value Date   CHOL 199 11/25/2022   HDL 82.70 11/25/2022   LDLCALC 97 11/25/2022   LDLDIRECT 104.0 11/01/2019   TRIG 97.0 11/25/2022   CHOLHDL 2 11/25/2022  A/P: last LDL above ideal goal but she wants to continue to work on lifestyle instead of increasing meds   Recommended follow up: Return for next already scheduled visit or sooner if needed. Future Appointments  Date Time Provider Department Center  03/03/2023  1:30 PM Tyrell Antonio, MD [facility] None  06/08/2023 12:00 PM Erroll Luna, [facility] None  06/16/2023  9:00 AM Shelva Majestic, MD [facility]  08/14/2023  9:00 AM Marcos Eke, PA-C [facility] None   Lab/Order associations:   ICD-10-CM   1. Elevated blood pressure reading  R03.0     2. Chronic cough  R05.3 Ambulatory referral to Pulmonology    3. Major depressive disorder in full remission, unspecified whether recurrent (HCC)  F32.5     4. Hyperlipidemia, unspecified hyperlipidemia type  E78.5     5. Aortic atherosclerosis (HCC)  I70.0      No orders of the defined types were placed in this encounter.  Return precautions advised.  Tana Conch, MD

## 2023-02-23 NOTE — Patient Instructions (Addendum)
Glad you are doing better- I'm not sure either what caused blood pressure increase but has come back down- perhaps check 1-2 x a week at least to keep an eye on it- goal <135/85 on average  We have placed a referral for you today to pulmonology. In some cases you will see # listed below- you can call this if you have not heard within a week. If you do not see # listed- you should receive a mychart message or phone call within a week with the # to call. Reach out to [place] if you ar enot scheduled within 2 weeks  Also call Dr. Tenny Craw for follow up appointment   Recommended follow up: Return for next already scheduled visit or sooner if needed.

## 2023-03-03 ENCOUNTER — Other Ambulatory Visit: Payer: Self-pay

## 2023-03-03 ENCOUNTER — Ambulatory Visit: Payer: Medicare HMO | Admitting: Physical Medicine and Rehabilitation

## 2023-03-03 VITALS — BP 115/63 | HR 65

## 2023-03-03 DIAGNOSIS — M5412 Radiculopathy, cervical region: Secondary | ICD-10-CM | POA: Diagnosis not present

## 2023-03-03 MED ORDER — METHYLPREDNISOLONE ACETATE 80 MG/ML IJ SUSP
80.0000 mg | Freq: Once | INTRAMUSCULAR | Status: AC
  Administered 2023-03-03: 80 mg

## 2023-03-03 NOTE — Progress Notes (Signed)
Functional Pain Scale - descriptive words and definitions  Moderate (4)   Constantly aware of pain, can complete ADLs with modification/sleep marginally affected at times/passive distraction is of no use, but active distraction gives some relief. Moderate range order  Average Pain  varies on position   +Driver, -BT, -Dye Allergies.  Neck pain

## 2023-03-03 NOTE — Patient Instructions (Signed)

## 2023-03-14 ENCOUNTER — Other Ambulatory Visit: Payer: Self-pay | Admitting: Family Medicine

## 2023-03-16 ENCOUNTER — Encounter: Payer: Self-pay | Admitting: Sports Medicine

## 2023-03-16 ENCOUNTER — Other Ambulatory Visit (INDEPENDENT_AMBULATORY_CARE_PROVIDER_SITE_OTHER): Payer: Medicare HMO

## 2023-03-16 ENCOUNTER — Ambulatory Visit: Payer: Medicare HMO | Admitting: Sports Medicine

## 2023-03-16 DIAGNOSIS — M25551 Pain in right hip: Secondary | ICD-10-CM

## 2023-03-16 DIAGNOSIS — M25552 Pain in left hip: Secondary | ICD-10-CM | POA: Diagnosis not present

## 2023-03-16 DIAGNOSIS — M5412 Radiculopathy, cervical region: Secondary | ICD-10-CM | POA: Diagnosis not present

## 2023-03-16 DIAGNOSIS — S62112D Displaced fracture of triquetrum [cuneiform] bone, left wrist, subsequent encounter for fracture with routine healing: Secondary | ICD-10-CM

## 2023-03-16 NOTE — Progress Notes (Signed)
Patient was instructed in 10 minutes of therapeutic exercises for left wrist to improve strength, ROM and function according to my instructions and plan of care by a Certified Athletic Trainer during the office visit. A customized handout was provided and demonstration of proper technique shown and discussed. Patient did perform exercises and demonstrate understanding through teachback.  All questions discussed and answered.

## 2023-03-16 NOTE — Progress Notes (Signed)
Linnell Swords - 72 y.o. female MRN [id number]  Date of birth: 1951/09/01  Office Visit Note: Visit Date: 03/16/2023 PCP: Shelva Majestic, MD Referred by: Shelva Majestic, MD  Subjective: Chief Complaint  Patient presents with   Left Wrist - Follow-up   HPI: Shannon Balthazar is a pleasant 72 y.o. female who presents today for follow-up of left wrist triquetral fracture, bilateral hip pain.  Left wrist - was placed in a short arm cast on 02/01/2022 for a mildly displaced triquetral fracture.  She was in a cast for almost 6 weeks.  Does not have pain over this location, just some generalized soreness and stiffness about the wrist.  Bilateral hip pain -this is doing better after we performed greater trochanteric injections previously.  Not quite as painful, still has days that bother her.  Still has her home exercises.  He is not taking Celebrex given her Eliquis use.  Cervical radiculopathy, the neck is doing much better after her injection with Dr. Alvester Morin on 03/03/2023.  Pertinent ROS were reviewed with the patient and found to be negative unless otherwise specified above in HPI.   Assessment & Plan: Visit Diagnoses:  1. Closed displaced fracture of triquetrum of left wrist with routine healing, subsequent encounter   2. Greater trochanteric pain syndrome of both lower extremities   3. Bilateral hip pain   4. Cervical radiculopathy    Plan: Discussed with Hindy that her x-rays do show a well-healing triquetral fracture, she is about 6 weeks out from immobilization in her cast.  At this point we will remove her from her cast.  Discussed being cautious over the next 2 weeks for full healing.  Just 1 in general does she does have some stiffness or pain associated with that, discussed getting her into occupational therapy or having her doing home exercises.  She would rather do these at home.  We did print out a customized handout in my athletic trainer Lequita Halt did review these in the  room with her today.  Her bilateral hips and neck are doing better after the injections, she can continue her home exercises from the hips.  Did discuss with her treatment she cannot take scheduled Celebrex given her Eliquis use, but asked if she would be check with her cardiologist if she continues Celebrex 100 mg only as needed for very infrequently given concomitant Eliquis use, she will message and let him know.  For now, we will hold off on this and the use Tylenol in the meantime.  Follow-up: Return if symptoms worsen or fail to improve.   Meds & Orders: No orders of the defined types were placed in this encounter.   Orders Placed This Encounter  Procedures   XR Wrist Complete Left     Procedures: No procedures performed      Clinical History: EXAM: MRI CERVICAL SPINE WITHOUT CONTRAST   TECHNIQUE: Multiplanar, multisequence MR imaging of the cervical spine was performed. No intravenous contrast was administered.   COMPARISON:  01/05/2022 brain MRI   FINDINGS: Alignment: Mild anterolisthesis at C2-3.   Vertebrae: No fracture, evidence of discitis, or bone lesion.   Cord: Normal signal and morphology.   Posterior Fossa, vertebral arteries, paraspinal tissues: Negative.   Disc levels:   C2-3: Slight anterolisthesis although no significant degenerative changes noted   C3-4: Uncovertebral ridging asymmetric to the left where there is moderate foraminal stenosis.   C4-5: Facet spurring and left uncovertebral ridging with mild left foraminal narrowing.   C5-6:  Disc narrowing with bulging and uncovertebral ridging. Mild left foraminal narrowing   C6-7: Degenerative facet spurring asymmetric to the left. Mild left uncovertebral ridging. Moderate left foraminal narrowing. Right foraminal root sleeve cyst.   C7-T1:Unremarkable.   IMPRESSION: 1. Cervical spine degeneration causing moderate foraminal stenosis on the left at C3-4 and C6-7. 2. No inflammation or  impingement seen at the skull base to explain radiation to the head.     Electronically Signed   By: Tiburcio Pea M.D.   On: 04/10/2022 17:05  She reports that she has never smoked. She has never used smokeless tobacco. No results for input(s): "HGBA1C", "LABURIC" in the last 8760 hours.  Objective:   Vital Signs: LMP  (LMP Unknown)   Physical Exam  Gen: Well-appearing, in no acute distress; non-toxic CV:  Well-perfused. Warm.  Resp: Breathing unlabored on room air; no wheezing. Psych: Fluid speech in conversation; appropriate affect; normal thought process Neuro: Sensation intact throughout. No gross coordination deficits.   Ortho Exam - Left wrist: No specific bony TTP.,  There is some generalized TTP on the radial side of the thumb and wrist but no gross pain over the triquetrum itself.  There is notable atrophy of the dorsum of the hand and dorsal forearm musculature.  She has only about 5-10 degrees less of extension of the wrist compared to contralateral side, she does have some restriction in flexion of about 20-25 degrees less than contralateral wrist.  Cap refill less than 2 seconds, neurovascular intact.  Full strength with finger abduction and adduction.  - Hips: Nonantalgic gait, no witnessed Trendelenburg gait.  Imaging: XR Wrist Complete Left  Result Date: 03/16/2023 3 views of the left wrist including AP, lateral and oblique films were ordered and reviewed by myself.  X-rays demonstrate a healing triquetral fracture with very mild displacement, no further movement compared to previous x-rays.  Callus formation is present.  There is redemonstrated arthritic change between the distal radius and proximal carpal row.  No other acute findings.   Past Medical/Family/Surgical/Social History: Medications & Allergies reviewed per EMR, new medications updated. Patient Active Problem List   Diagnosis Date Noted   Seasonal allergic rhinitis due to pollen 01/01/2023   Tear of  right gluteus medius tendon    Involuntary movements 02/14/2020   Aortic atherosclerosis (HCC) 02/07/2020   Delayed gastric emptying 02/07/2020   Dystonia    Restless legs 07/27/2018   Recurrent UTI 10/07/2016   Major depression in full remission (HCC) 09/02/2016   GERD (gastroesophageal reflux disease) 01/30/2016   History of SI/intentional drug overdose 11/22/2015   Atrial fibrillation (HCC)    Chronic pain syndrome    Hyperlipidemia    Past Medical History:  Diagnosis Date   Anemia    Arthritis    DJD, low back, thumb   Atrial fibrillation (HCC)    Xarelto anticoagulation. Flecainide antiarrythmic    Chicken pox    Chronic pain syndrome    On disability. History of bilateral hip pain, low back pain. Gabapentin 400 BID, percocet 1 tablet daily per prior provider.    Colon polyp    awaiting records   Depression    zoloft 100mg , remeron 30mg  per psychiatry. ambien 10mg  per psychiatry to help wtih sleep element.    Dysrhythmia    Dystonia    described as psychogenic dystonia. Pain and twisting from upper chest and up with triggers "wind, creamy food" on TID ativan per psychiatry previously.    Fibromyalgia    GERD (gastroesophageal  reflux disease)    omeprazole OTC   Goiter    states multiple imaging tests, has had biopsies   Hyperlipidemia    lovastatin 20mg    Stroke (HCC)    TIA (left side of face and body decreased sensitivity than right face and side)   TIA (transient ischemic attack)    Vaginal atrophy    estrace vaginal cream   Family History  Problem Relation Age of Onset   Alcohol abuse Mother        possible she had stomach cancer as well but unsure   Hypertension Father    Alcohol abuse Father    Pancreatic cancer Father    Cancer Brother        spindle cell RLE   Diabetes Brother    Cancer Brother        tonsil cancer   Skin cancer Brother    Colon cancer Neg Hx    Colon polyps Neg Hx    Stomach cancer Neg Hx    Past Surgical History:  Procedure  Laterality Date   ATRIAL FIBRILLATION ABLATION     BIOPSY THYROID     fusion l4-l5  09/22/1998   GLUTEUS MINIMUS REPAIR Right 05/19/2022   Procedure: RIGHT HIP GLUTEUS MEDIUS TENDON REPAIR;  Surgeon: Huel Cote, MD;  Location: [facility] OR;  Service: Orthopedics;  Laterality: Right;   LAMINECTOMY     L4-L5   OTHER SURGICAL HISTORY     tennis elbow surgery   piriformis release  09/22/1997   right hip   TONSILLECTOMY AND ADENOIDECTOMY  age 75   VAGINAL HYSTERECTOMY  09/23/1991   Social History   Occupational History   Occupation: retired  Tobacco Use   Smoking status: Never   Smokeless tobacco: Never  Vaping Use   Vaping Use: Never used  Substance and Sexual Activity   Alcohol use: No    Alcohol/week: 0.0 standard drinks of alcohol   Drug use: No   Sexual activity: Not Currently    Partners: Male   I spent 34 minutes in the care of the patient today including face-to-face time, preparation to see the patient, as well as review of procedure note from cervical injection by Dr. Ezzard Standing on 6/11, Discussion of healing wrist fracture, home activity and modification, plan for continued treatment for her bilateral hips and the above diagnoses.   Madelyn Brunner, DO Primary Care Sports Medicine Physician  [facility]  This note was dictated using Dragon naturally speaking software and may contain errors in syntax, spelling, or content which have not been identified prior to signing this note.

## 2023-03-16 NOTE — Procedures (Signed)
Cervical Epidural Steroid Injection - Interlaminar Approach with Fluoroscopic Guidance  Patient: Danielle Rocha      Date of Birth: 11-11-1950 MRN: [id number] PCP: Shelva Majestic, MD      Visit Date: 03/03/2023   Universal Protocol:    Date/Time: 06/24/247:12 PM  Consent Given By: the patient  Position: PRONE  Additional Comments: Vital signs were monitored before and after the procedure. Patient was prepped and draped in the usual sterile fashion. The correct patient, procedure, and site was verified.   Injection Procedure Details:   Procedure diagnoses: Cervical radiculopathy [M54.12]    Meds Administered:  Meds ordered this encounter  Medications   methylPREDNISolone acetate (DEPO-MEDROL) injection 80 mg     Laterality: Right  Location/Site: C7-T1  Needle: 3.5 in., 20 ga. Tuohy  Needle Placement: Paramedian epidural space  Findings:  -Comments: Excellent flow of contrast into the epidural space.  Procedure Details: Using a paramedian approach from the side mentioned above, the region overlying the inferior lamina was localized under fluoroscopic visualization and the soft tissues overlying this structure were infiltrated with 4 ml. of 1% Lidocaine without Epinephrine. A # 20 gauge, Tuohy needle was inserted into the epidural space using a paramedian approach.  The epidural space was localized using loss of resistance along with contralateral oblique bi-planar fluoroscopic views.  After negative aspirate for air, blood, and CSF, a 2 ml. volume of Isovue-250 was injected into the epidural space and the flow of contrast was observed. Radiographs were obtained for documentation purposes.   The injectate was administered into the level noted above.  Additional Comments:  The patient tolerated the procedure well Dressing: 2 x 2 sterile gauze and Band-Aid    Post-procedure details: Patient was observed during the procedure. Post-procedure instructions were  reviewed.  Patient left the clinic in stable condition.

## 2023-03-16 NOTE — Progress Notes (Signed)
Danielle Rocha - 72 y.o. female MRN [id number]  Date of birth: [birth year]  Office Visit Note: Visit Date: 03/03/2023 PCP: Shelva Majestic, MD Referred by: Shelva Majestic, MD  Subjective: Chief Complaint  Patient presents with   Neck - Pain   HPI:  Danielle Rocha is a 72 y.o. female who comes in today at the request of Dr. Madelyn Brunner for planned Right C7-T1 Cervical Interlaminar epidural steroid injection with fluoroscopic guidance.  The patient has failed conservative care including home exercise, medications, time and activity modification.  This injection will be diagnostic and hopefully therapeutic.  Please see requesting physician notes for further details and justification.   ROS Otherwise per HPI.  Assessment & Plan: Visit Diagnoses:    ICD-10-CM   1. Cervical radiculopathy  M54.12 XR C-ARM NO REPORT    Epidural Steroid injection    methylPREDNISolone acetate (DEPO-MEDROL) injection 80 mg      Plan: No additional findings.   Meds & Orders:  Meds ordered this encounter  Medications   methylPREDNISolone acetate (DEPO-MEDROL) injection 80 mg    Orders Placed This Encounter  Procedures   XR C-ARM NO REPORT   Epidural Steroid injection    Follow-up: Return for visit to requesting provider as needed.   Procedures: No procedures performed  Cervical Epidural Steroid Injection - Interlaminar Approach with Fluoroscopic Guidance  Patient: Danielle Rocha      Date of Birth: 01-19-1951 MRN: [id number] PCP: Shelva Majestic, MD      Visit Date: 03/03/2023   Universal Protocol:    Date/Time: 06/24/247:12 PM  Consent Given By: the patient  Position: PRONE  Additional Comments: Vital signs were monitored before and after the procedure. Patient was prepped and draped in the usual sterile fashion. The correct patient, procedure, and site was verified.   Injection Procedure Details:   Procedure diagnoses: Cervical radiculopathy [M54.12]    Meds  Administered:  Meds ordered this encounter  Medications   methylPREDNISolone acetate (DEPO-MEDROL) injection 80 mg     Laterality: Right  Location/Site: C7-T1  Needle: 3.5 in., 20 ga. Tuohy  Needle Placement: Paramedian epidural space  Findings:  -Comments: Excellent flow of contrast into the epidural space.  Procedure Details: Using a paramedian approach from the side mentioned above, the region overlying the inferior lamina was localized under fluoroscopic visualization and the soft tissues overlying this structure were infiltrated with 4 ml. of 1% Lidocaine without Epinephrine. A # 20 gauge, Tuohy needle was inserted into the epidural space using a paramedian approach.  The epidural space was localized using loss of resistance along with contralateral oblique bi-planar fluoroscopic views.  After negative aspirate for air, blood, and CSF, a 2 ml. volume of Isovue-250 was injected into the epidural space and the flow of contrast was observed. Radiographs were obtained for documentation purposes.   The injectate was administered into the level noted above.  Additional Comments:  The patient tolerated the procedure well Dressing: 2 x 2 sterile gauze and Band-Aid    Post-procedure details: Patient was observed during the procedure. Post-procedure instructions were reviewed.  Patient left the clinic in stable condition.   Clinical History: EXAM: MRI CERVICAL SPINE WITHOUT CONTRAST   TECHNIQUE: Multiplanar, multisequence MR imaging of the cervical spine was performed. No intravenous contrast was administered.   COMPARISON:  01/05/2022 brain MRI   FINDINGS: Alignment: Mild anterolisthesis at C2-3.   Vertebrae: No fracture, evidence of discitis, or bone lesion.   Cord: Normal signal and morphology.  Posterior Fossa, vertebral arteries, paraspinal tissues: Negative.   Disc levels:   C2-3: Slight anterolisthesis although no significant degenerative changes noted    C3-4: Uncovertebral ridging asymmetric to the left where there is moderate foraminal stenosis.   C4-5: Facet spurring and left uncovertebral ridging with mild left foraminal narrowing.   C5-6: Disc narrowing with bulging and uncovertebral ridging. Mild left foraminal narrowing   C6-7: Degenerative facet spurring asymmetric to the left. Mild left uncovertebral ridging. Moderate left foraminal narrowing. Right foraminal root sleeve cyst.   C7-T1:Unremarkable.   IMPRESSION: 1. Cervical spine degeneration causing moderate foraminal stenosis on the left at C3-4 and C6-7. 2. No inflammation or impingement seen at the skull base to explain radiation to the head.     Electronically Signed   By: Tiburcio Pea M.D.   On: 04/10/2022 17:05     Objective:  VS:  HT:    WT:   BMI:     BP:115/63  HR:65bpm  TEMP: ( )  RESP:  Physical Exam Vitals and nursing note reviewed.  Constitutional:      General: She is not in acute distress.    Appearance: Normal appearance. She is not ill-appearing.  HENT:     Head: Normocephalic and atraumatic.     Right Ear: External ear normal.     Left Ear: External ear normal.  Eyes:     Extraocular Movements: Extraocular movements intact.  Cardiovascular:     Rate and Rhythm: Normal rate.     Pulses: Normal pulses.  Musculoskeletal:     Cervical back: Tenderness present. No rigidity.     Right lower leg: No edema.     Left lower leg: No edema.     Comments: Patient has good strength in the upper extremities including 5 out of 5 strength in wrist extension long finger flexion and APB.  There is no atrophy of the hands intrinsically.  There is a negative Hoffmann's test.   Lymphadenopathy:     Cervical: No cervical adenopathy.  Skin:    Findings: No erythema, lesion or rash.  Neurological:     General: No focal deficit present.     Mental Status: She is alert and oriented to person, place, and time.     Sensory: No sensory deficit.      Motor: No weakness or abnormal muscle tone.     Coordination: Coordination normal.  Psychiatric:        Mood and Affect: Mood normal.        Behavior: Behavior normal.      Imaging: XR Wrist Complete Left  Result Date: 03/16/2023 3 views of the left wrist including AP, lateral and oblique films were ordered and reviewed by myself.  X-rays demonstrate a healing triquetral fracture with very mild displacement, no further movement compared to previous x-rays.  Callus formation is present.  There is redemonstrated arthritic change between the distal radius and proximal carpal row.  No other acute findings.

## 2023-03-30 ENCOUNTER — Encounter: Payer: Self-pay | Admitting: Sports Medicine

## 2023-03-31 ENCOUNTER — Ambulatory Visit
Admission: RE | Admit: 2023-03-31 | Discharge: 2023-03-31 | Disposition: A | Discharge status: Home / Self Care | Payer: Medicare HMO | Source: Ambulatory Visit | Attending: Student in an Organized Health Care Education/Training Program | Admitting: Student in an Organized Health Care Education/Training Program

## 2023-03-31 ENCOUNTER — Other Ambulatory Visit: Payer: Self-pay | Admitting: Sports Medicine

## 2023-03-31 ENCOUNTER — Encounter: Payer: Self-pay | Admitting: Student in an Organized Health Care Education/Training Program

## 2023-03-31 ENCOUNTER — Ambulatory Visit (HOSPITAL_BASED_OUTPATIENT_CLINIC_OR_DEPARTMENT_OTHER): Payer: Medicare HMO | Admitting: Student in an Organized Health Care Education/Training Program

## 2023-03-31 VITALS — BP 127/59 | HR 65 | Temp 97.2°F | Ht 68.0 in | Wt 150.0 lb

## 2023-03-31 DIAGNOSIS — G894 Chronic pain syndrome: Secondary | ICD-10-CM

## 2023-03-31 DIAGNOSIS — S62112D Displaced fracture of triquetrum [cuneiform] bone, left wrist, subsequent encounter for fracture with routine healing: Secondary | ICD-10-CM

## 2023-03-31 DIAGNOSIS — M533 Sacrococcygeal disorders, not elsewhere classified: Secondary | ICD-10-CM | POA: Insufficient documentation

## 2023-03-31 DIAGNOSIS — M5416 Radiculopathy, lumbar region: Secondary | ICD-10-CM

## 2023-03-31 DIAGNOSIS — Z981 Arthrodesis status: Secondary | ICD-10-CM

## 2023-03-31 DIAGNOSIS — G8929 Other chronic pain: Secondary | ICD-10-CM

## 2023-03-31 MED ORDER — PREGABALIN 25 MG PO CAPS
ORAL_CAPSULE | ORAL | 0 refills | Status: DC
Start: 2023-03-31 — End: 2023-04-21

## 2023-03-31 NOTE — Progress Notes (Signed)
Patient: Danielle Rocha  Service Category: E/M  Provider: Edward Jolly, MD  DOB: 06/22/1951  DOS: 03/31/2023  Referring Provider: Madelyn Brunner, DO  MRN: [id number]  Setting: Ambulatory outpatient  PCP: Shelva Majestic, MD  Type: New Patient  Specialty: Interventional Pain Management    Location: Office  Delivery: Face-to-face     Primary Reason(s) for Visit: Encounter for initial evaluation of one or more chronic problems (new to examiner) potentially causing chronic pain, and posing a threat to normal musculoskeletal function. (Level of risk: High) CC: Hip Pain (bialteral)  HPI  Danielle Rocha is a 72 y.o. year year old, female patient, who comes for the first time to our practice referred by Madelyn Brunner, DO for our initial evaluation of her chronic pain. She has Atrial fibrillation (HCC); Chronic pain syndrome; Hyperlipidemia; History of SI/intentional drug overdose; GERD (gastroesophageal reflux disease); Major depression in full remission (HCC); Recurrent UTI; Restless legs; Aortic atherosclerosis (HCC); Dystonia; Delayed gastric emptying; Involuntary movements; Tear of right gluteus medius tendon; Seasonal allergic rhinitis due to pollen; History of lumbar fusion; Chronic radicular lumbar pain; and Sacroiliac joint pain on their problem list. Today she comes in for evaluation of her Hip Pain (bialteral)  Pain Assessment: Location: Right, Left Hip Radiating: radiates down both legs to knees front and side Onset: More than a month ago Duration: Chronic pain Quality: Constant, Aching, Burning, Throbbing, Stabbing, Sharp Severity: 5 /10 (subjective, self-reported pain score)  Effect on ADL: limits ADLS Timing: Constant Modifying factors: rest, ice, heat BP: (!) 127/59  HR: 65  Onset and Duration: Sudden and Date of onset: 3/98 Cause of pain: Unknown Severity: No change since onset, NAS-11 at its worse: 10/10, NAS-11 at its best: 3/10, and NAS-11 on the average: 7/10 Timing: Not influenced by the  time of the day, During activity or exercise, After activity or exercise, and After a period of immobility Aggravating Factors: Bending, Kneeling, Lifiting, Prolonged sitting, Prolonged standing, Squatting, Stooping , Twisting, Walking, Walking uphill, Walking downhill, and Working Alleviating Factors: Hot packs, Lying down, Nerve blocks, Resting, Sleeping, and TENS Associated Problems: Constipation, Depression, Fatigue, Sadness, Temperature changes, Weakness, Pain that wakes patient up, and Pain that does not allow patient to sleep Quality of Pain: Aching, Agonizing, Constant, Cruel, Deep, Disabling, Distressing, Dreadful, Exhausting, Feeling of weight, Horrible, Nagging, Punishing, Shooting, Sickening, Tender, Throbbing, and Tiring Previous Examinations or Tests: Bone scan, CT scan, MRI scan, Nerve block, X-rays, Nerve conduction test, Neurological evaluation, Neurosurgical evaluation, Orthopedic evaluation, Chiropractic evaluation, and Psychiatric evaluation Previous Treatments: Chiropractic manipulations, Epidural steroid injections, Facet blocks, Narcotic medications, Physical Therapy, Pool exercises, Steroid treatments by mouth, Strengthening exercises, Stretching exercises, TENS, and Trigger point injections  Ms. Wootan is being evaluated for possible interventional pain management therapies for the treatment of her chronic pain.   Danielle Rocha is a pleasant 72 year old female who presents with a chief complaint of low back pain as well as bilateral hip pain that has been going on for many years.  Of note she states that she woke up 1 morning in 1998 with excruciating right hip pain that soon after also progressed to her left hip.  She has bilateral hip pain for greater than 20 years.  She had a right piriformis release in 1999.  She subsequently had a L4-L5 lumbar spinal fusion in 2000.  She tore her medial gluteal muscle in 2023 and had a subsequent surgical repair.  She was previously seen at a pain  clinic in [place] where they did spinal injections, facet  blocks with limited response.  She states that she has tried fentanyl patches, Percocet as well as other opioid analgesics in the past and she did not like how it made her feel nor did it help with her pain.  She has also tried gabapentin in the past.  She has not tried Lyrica.  She has a history of depression.  Family history of alcohol and substance abuse.  She is on Xarelto for atrial fibrillation.  She recently had trochanteric bursa injections under ultrasound guidance with orthopedics.  She states that these were somewhat helpful.  Historic Controlled Substance Pharmacotherapy Review   Historical Monitoring: The patient  reports no history of drug use. List of prior UDS Testing: Lab Results  Component Value Date   COCAINSCRNUR NONE DETECTED 09/01/2016   COCAINSCRNUR NONE DETECTED 11/22/2015   THCU NONE DETECTED 09/01/2016   THCU NONE DETECTED 11/22/2015   ETH <10 06/06/2021   ETH <10 03/12/2021   ETH <5 09/01/2016   Historical Background Evaluation: [place] PMP: PDMP reviewed during this encounter. Review of the past 48-months conducted.              [place] Department of public safety, offender search: Engineer, mining Information) Non-contributory Risk Assessment Profile: Aberrant behavior: None observed or detected today Risk factors for fatal opioid overdose: None identified today Fatal overdose hazard ratio (HR): Calculation deferred Non-fatal overdose hazard ratio (HR): Calculation deferred Risk of opioid abuse or dependence: 0.7-3.0% with doses ? 36 MME/day and 6.1-26% with doses ? 120 MME/day. Substance use disorder (SUD) risk level: High Personal History of Substance Abuse (SUD-Substance use disorder):  Alcohol: Negative  Illegal Drugs: Negative  Rx Drugs: Negative  ORT Risk Level calculation: High Risk  Opioid Risk Tool - 03/31/23 0957       Family History of Substance Abuse   Alcohol Positive Female   mother and sister and  2 sons   Illegal Drugs Positive Female   sister and 2 sons   Rx Drugs Positive Female or Female      Personal History of Substance Abuse   Alcohol Negative    Illegal Drugs Negative    Rx Drugs Negative      Psychological Disease   Psychological Disease Negative    Depression Positive      Total Score   Opioid Risk Tool Scoring 11    Opioid Risk Interpretation High Risk            ORT Scoring interpretation table:  Score <3 = Low Risk for SUD  Score between 4-7 = Moderate Risk for SUD  Score >8 = High Risk for Opioid Abuse   PHQ-2 Depression Scale:  Total score:    PHQ-2 Scoring interpretation table: (Score and probability of major depressive disorder)  Score 0 = No depression  Score 1 = 15.4% Probability  Score 2 = 21.1% Probability  Score 3 = 38.4% Probability  Score 4 = 45.5% Probability  Score 5 = 56.4% Probability  Score 6 = 78.6% Probability   PHQ-9 Depression Scale:  Total score:    PHQ-9 Scoring interpretation table:  Score 0-4 = No depression  Score 5-9 = Mild depression  Score 10-14 = Moderate depression  Score 15-19 = Moderately severe depression  Score 20-27 = Severe depression (2.4 times higher risk of SUD and 2.89 times higher risk of overuse)   Pharmacologic Plan: As per protocol, I have not taken over any controlled substance management, pending the results of ordered tests and/or consults.  Initial impression: Pending review of available data and ordered tests.  Meds   Current Outpatient Medications:    acetaminophen (TYLENOL) 500 MG tablet, Take 1,000 mg by mouth every 6 (six) hours as needed for mild pain, moderate pain, fever or headache., Disp: , Rfl:    buPROPion (WELLBUTRIN XL) 300 MG 24 hr tablet, Take 300 mg by mouth daily. Through psychiatry, Disp: , Rfl:    cephALEXin (KEFLEX) 250 MG capsule, Take 250 mg by mouth daily., Disp: , Rfl:    Cholecalciferol (VITAMIN D) 125 MCG (5000 UT) CAPS, Take 5,000 Units by mouth daily., Disp: ,  Rfl:    Coenzyme Q10 200 MG TABS, Take 200 mg by mouth daily., Disp: , Rfl:    Cyanocobalamin (B-12 PO), Take 1 tablet by mouth daily., Disp: , Rfl:    Erenumab-aooe (AIMOVIG) 140 MG/ML SOAJ, Inject 140 mg into the skin every 28 (twenty-eight) days., Disp: 1.12 mL, Rfl: 11   flecainide (TAMBOCOR) 100 MG tablet, Take 1 tablet by mouth twice daily, Disp: 180 tablet, Rfl: 0   fluticasone (FLONASE) 50 MCG/ACT nasal spray, Place 2 sprays into both nostrils daily., Disp: 16 g, Rfl: 3   LORazepam (ATIVAN) 0.5 MG tablet, Take 1 tablet (0.5 mg total) by mouth daily as needed for anxiety (and dystonia)., Disp: 10 tablet, Rfl: 0   Magnesium Oxide 420 MG TABS, Take 420 mg by mouth daily., Disp: , Rfl:    melatonin 5 MG TABS, Take 5 mg by mouth at bedtime., Disp: , Rfl:    Multiple Vitamin (MULTIVITAMIN WITH MINERALS) TABS tablet, Take 1 tablet by mouth daily., Disp: , Rfl:    ondansetron (ZOFRAN ODT) 4 MG disintegrating tablet, Take 1 tablet (4 mg total) by mouth every 8 (eight) hours as needed for nausea or vomiting., Disp: 20 tablet, Rfl: 0   pantoprazole (PROTONIX) 20 MG tablet, Take 1 tablet (20 mg total) by mouth 2 (two) times daily before a meal., Disp: 180 tablet, Rfl: 3   pregabalin (LYRICA) 25 MG capsule, Take 1 capsule (25 mg total) by mouth at bedtime for 15 days, THEN 2 capsules (50 mg total) at bedtime., Disp: 75 capsule, Rfl: 0   pyridOXINE (VITAMIN B6) 100 MG tablet, Take 100 mg by mouth daily., Disp: , Rfl:    sertraline (ZOLOFT) 50 MG tablet, Take 50 mg by mouth in the morning and at bedtime. Through psychiatry, Disp: , Rfl:    SUMAtriptan (IMITREX) 100 MG tablet, TAKE ONE TABLET BY MOUTH AT ONSET OF MIGRAINE FOR UP TO 1 DOSE, Disp: 10 tablet, Rfl: 5   XARELTO 20 MG TABS tablet, TAKE 1 TABLET BY MOUTH ONCE DAILY WITH SUPPER, Disp: 90 tablet, Rfl: 0   rosuvastatin (CRESTOR) 20 MG tablet, Take 1 tablet by mouth once daily (Patient not taking: Reported on 03/31/2023), Disp: 90 tablet, Rfl: 0    senna-docusate (SENOKOT-S) 8.6-50 MG tablet, Take 1 tablet by mouth daily. (Patient not taking: Reported on 03/31/2023), Disp: 30 tablet, Rfl: 0   traZODone (DESYREL) 50 MG tablet, TAKE 1 TABLET BY MOUTH AT BEDTIME AS NEEDED FOR SLEEP (Patient not taking: Reported on 03/31/2023), Disp: 30 tablet, Rfl: 0  Imaging Review  Cervical Imaging: Cervical MR wo contrast: Results for orders placed in visit on 04/01/22  MR CERVICAL SPINE WO CONTRAST  Narrative CLINICAL DATA:  Chronic neck pain for 10-12 months. Pain radiates to the head  EXAM: MRI CERVICAL SPINE WITHOUT CONTRAST  TECHNIQUE: Multiplanar, multisequence MR imaging of the cervical spine was performed.  No intravenous contrast was administered.  COMPARISON:  01/05/2022 brain MRI  FINDINGS: Alignment: Mild anterolisthesis at C2-3.  Vertebrae: No fracture, evidence of discitis, or bone lesion.  Cord: Normal signal and morphology.  Posterior Fossa, vertebral arteries, paraspinal tissues: Negative.  Disc levels:  C2-3: Slight anterolisthesis although no significant degenerative changes noted  C3-4: Uncovertebral ridging asymmetric to the left where there is moderate foraminal stenosis.  C4-5: Facet spurring and left uncovertebral ridging with mild left foraminal narrowing.  C5-6: Disc narrowing with bulging and uncovertebral ridging. Mild left foraminal narrowing  C6-7: Degenerative facet spurring asymmetric to the left. Mild left uncovertebral ridging. Moderate left foraminal narrowing. Right foraminal root sleeve cyst.  C7-T1:Unremarkable.  IMPRESSION: 1. Cervical spine degeneration causing moderate foraminal stenosis on the left at C3-4 and C6-7. 2. No inflammation or impingement seen at the skull base to explain radiation to the head.   Electronically Signed By: Tiburcio Pea M.D. On: 04/10/2022 17:05   CT Cervical Spine Wo Contrast  Narrative CLINICAL DATA:  Fall on Monday. Left wrist fractures. Severe  left rib pain and use of anticoagulation  EXAM: CT HEAD WITHOUT CONTRAST  CT CERVICAL SPINE WITHOUT CONTRAST  TECHNIQUE: Multidetector CT imaging of the head and cervical spine was performed following the standard protocol without intravenous contrast. Multiplanar CT image reconstructions of the cervical spine were also generated.  RADIATION DOSE REDUCTION: This exam was performed according to the departmental dose-optimization program which includes automated exposure control, adjustment of the mA and/or kV according to patient size and/or use of iterative reconstruction technique.  COMPARISON:  Brain MRI 01/05/2022  FINDINGS: CT HEAD FINDINGS  Brain: No evidence of acute infarction, hemorrhage, hydrocephalus, extra-axial collection or mass lesion/mass effect.  Vascular: No hyperdense vessel or unexpected calcification.  Skull: Normal. Negative for fracture or focal lesion.  Sinuses/Orbits: No acute finding.  CT CERVICAL SPINE FINDINGS  Alignment: No traumatic malalignment  Skull base and vertebrae: No acute fracture. No primary bone lesion or focal pathologic process.  Soft tissues and spinal canal: No prevertebral fluid or swelling. No visible canal hematoma.  Disc levels:  Unremarkable for age  Upper chest: Reported separately  IMPRESSION: No evidence of acute intracranial or cervical spine injury.   Electronically Signed By: Tiburcio Pea M.D. On: 01/31/2023 08:48  MR HIP RIGHT WO CONTRAST  Narrative CLINICAL DATA:  Chronic bilateral hip pain. Status post right piriformis release 1999. Tendon/ligament abnormality suspected.  EXAM: MR OF THE RIGHT HIP WITHOUT CONTRAST  TECHNIQUE: Multiplanar, multisequence MR imaging was performed. No intravenous contrast was administered.  COMPARISON:  Bilateral hip radiographs 11/20/2021  FINDINGS: Bones: There is metallic artifact from lower lumbar spine posterior fusion hardware. No acute fracture or  avascular necrosis is seen within the visualized portions of the pelvis or either proximal femur.  --  RIGHT HIP:  Articular cartilage and labrum  Articular cartilage: Mild-to-moderate thinning of the anterior superior right femoral head and acetabular cartilage. Mild right femoral head-neck junction peripheral degenerative osteophytes. Normal morphology of the right femoral head-neck junction without CAM-type bump abnormality.  Labrum: Lack of intra-articular fluid limits evaluation of the right acetabular labrum. There is intermediate T2 signal and peripheral degenerative irregularity of the anterior superior right acetabular labrum (series 14, image 11 and series 13, image 14).  Joint or bursal effusion  Joint effusion:  No right hip joint effusion.  Bursae: Trace fluid within the right trochanteric bursa.  ---  LEFT HIP:  Articular cartilage and labrum  Articular cartilage: There  is mild-to-moderate thinning of the anterior superior left femoral head and acetabular cartilage. Mild left femoral head-neck junction peripheral degenerative osteophytes. Normal morphology of the femoral head-neck junction is otherwise seen without CAM-type bump deformity.  Labrum: Lack of intra-articular fluid limits evaluation of the left acetabular labrum. There is minimal degenerative peripheral blunting of the anterior superior left acetabular labrum.  Joint or bursal effusion  Joint effusion:  No left hip joint effusion.  Bursae: Mild fluid within the left trochanteric bursa.  Muscles and tendons  Muscles and tendons: The origins of the bilateral rectus femoris tendons are intact. Mild increased T2 fluid bright signal in between the semimembranosus and conjoined origin of the left semitendinosis and biceps femoris tendon origins at the left ischial tuberosity with mild surrounding edema likely mild tendinosis. The right common hamstring tendon origin is intact.  Mild  intermediate T2 signal within and mild fluid around the left gluteus minimus tendon insertion, likely mild tendinosis and tenosynovitis respectively. Minimal right gluteus minimus insertional tenosynovitis. The bilateral gluteus medius tendon insertions are intact. The bilateral iliopsoas tendon insertions are intact.  Other findings  Miscellaneous:   None.  IMPRESSION:: IMPRESSION: 1. Mild-to-moderate bilateral anterior superior femoral head and acetabular cartilage thinning. 2. Mild left-greater-than-right trochanteric bursitis. 3. Mild left common hamstring origin tendinosis. 4. Mild left and minimal right gluteus minimus insertional tenosynovitis with mild left gluteus minimus tendinosis.   Electronically Signed By: Neita Garnet M.D. On: 12/18/2021 13:16  MR HIP LEFT WO CONTRAST  Narrative CLINICAL DATA:  Chronic bilateral hip pain. Status post right piriformis release 1999. Tendon/ligament abnormality suspected.  EXAM: MR OF THE RIGHT HIP WITHOUT CONTRAST  TECHNIQUE: Multiplanar, multisequence MR imaging was performed. No intravenous contrast was administered.  COMPARISON:  Bilateral hip radiographs 11/20/2021  FINDINGS: Bones: There is metallic artifact from lower lumbar spine posterior fusion hardware. No acute fracture or avascular necrosis is seen within the visualized portions of the pelvis or either proximal femur.  --  RIGHT HIP:  Articular cartilage and labrum  Articular cartilage: Mild-to-moderate thinning of the anterior superior right femoral head and acetabular cartilage. Mild right femoral head-neck junction peripheral degenerative osteophytes. Normal morphology of the right femoral head-neck junction without CAM-type bump abnormality.  Labrum: Lack of intra-articular fluid limits evaluation of the right acetabular labrum. There is intermediate T2 signal and peripheral degenerative irregularity of the anterior superior right  acetabular labrum (series 14, image 11 and series 13, image 14).  Joint or bursal effusion  Joint effusion:  No right hip joint effusion.  Bursae: Trace fluid within the right trochanteric bursa.  ---  LEFT HIP:  Articular cartilage and labrum  Articular cartilage: There is mild-to-moderate thinning of the anterior superior left femoral head and acetabular cartilage. Mild left femoral head-neck junction peripheral degenerative osteophytes. Normal morphology of the femoral head-neck junction is otherwise seen without CAM-type bump deformity.  Labrum: Lack of intra-articular fluid limits evaluation of the left acetabular labrum. There is minimal degenerative peripheral blunting of the anterior superior left acetabular labrum.  Joint or bursal effusion  Joint effusion:  No left hip joint effusion.  Bursae: Mild fluid within the left trochanteric bursa.  Muscles and tendons  Muscles and tendons: The origins of the bilateral rectus femoris tendons are intact. Mild increased T2 fluid bright signal in between the semimembranosus and conjoined origin of the left semitendinosis and biceps femoris tendon origins at the left ischial tuberosity with mild surrounding edema likely mild tendinosis. The right common hamstring tendon origin is  intact.  Mild intermediate T2 signal within and mild fluid around the left gluteus minimus tendon insertion, likely mild tendinosis and tenosynovitis respectively. Minimal right gluteus minimus insertional tenosynovitis. The bilateral gluteus medius tendon insertions are intact. The bilateral iliopsoas tendon insertions are intact.  Other findings  Miscellaneous:   None.  IMPRESSION:: IMPRESSION: 1. Mild-to-moderate bilateral anterior superior femoral head and acetabular cartilage thinning. 2. Mild left-greater-than-right trochanteric bursitis. 3. Mild left common hamstring origin tendinosis. 4. Mild left and minimal right gluteus  minimus insertional tenosynovitis with mild left gluteus minimus tendinosis.   Electronically Signed By: Neita Garnet M.D. On: 12/18/2021 13:16   Narrative CLINICAL DATA:  Chronic bilateral hip pain.  EXAM: DG HIP (WITH OR WITHOUT PELVIS) 4+V RIGHT; DG HIP (WITH OR WITHOUT PELVIS) 2-3V LEFT  COMPARISON:  CT abdomen and pelvis 11/04/2019  FINDINGS: L4-5 posterior fusion hardware is noted with L4-5 posterior element bony arthrodesis.  The bilateral sacroiliac joint spaces are maintained.  Mild right-greater-than-left superior femoroacetabular joint space narrowing. Mild bilateral superolateral acetabular degenerative osteophytes. Mild right femoral head-neck junction degenerative osteophytosis. Mild pubic symphysis joint space narrowing, subchondral sclerosis, and peripheral osteophytosis degenerative change.  No acute fracture or dislocation.  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   Electronically Signed By: Neita Garnet M.D. On: 11/21/2021 11:37   Narrative CLINICAL DATA:  Pt states a fall in her home 3 days ago - pain and swelling concentrated medial right knee  EXAM: RIGHT KNEE - 1-2 VIEW  COMPARISON:  None.  FINDINGS: No fracture.  No bone lesion.  Mild narrowing of the medial joint space compartment. Minor medial compartment marginal osteophytes. No other degenerative change. No joint effusion. The soft tissues are unremarkable.  IMPRESSION: No fracture or acute finding.   Electronically Signed By: Amie Portland M.D. On: 11/23/2015 17:48   Narrative CLINICAL DATA:  Fall  EXAM: RIGHT KNEE - COMPLETE 4+ VIEW  COMPARISON:  None.  FINDINGS: There is no evidence of acute fracture. There is mild tricompartment degenerative change with chondrocalcinosis, worst medially. No significant joint effusion. Suprapatellar enthesophyte.  IMPRESSION: No evidence of acute  fracture.  Tricompartment degenerative changes of the right knee with chondrocalcinosis, worst medially.   Electronically Signed By: Caprice Renshaw   Narrative CLINICAL DATA:  Fall  EXAM: LEFT KNEE - COMPLETE 4+ VIEW  COMPARISON:  None.  FINDINGS: There is no evidence of acute fracture. There is mild tricompartment degenerative change, worst in the medial compartment. There is chondrocalcinosis. No significant joint effusion. Suprapatellar enthesophyte.  IMPRESSION: No evidence of acute fracture.  Tricompartment degenerative changes of the left knee, worst in the medial compartment.   Electronically Signed By: Caprice Renshaw On: 03/12/2021 10:16   Narrative CLINICAL DATA:  Larey Seat down steps onto driveway today, generalized RIGHT elbow pain, initial encounter  EXAM: RIGHT ELBOW - COMPLETE 3+ VIEW  COMPARISON:  Non  FINDINGS: Osseous demineralization.  Joint spaces preserved.  No acute fracture, dislocation or bone destruction.  No elbow joint effusion.  IMPRESSION: No acute osseous abnormalities.   Electronically Signed By: Ulyses Southward M.D. On: 04/23/2016 16:03    Narrative CLINICAL DATA:  Larey Seat down steps onto driveway earlier today, RIGHT wrist pain  EXAM: RIGHT WRIST - COMPLETE 3+ VIEW  COMPARISON:  None  FINDINGS: Diffuse osseous demineralization.  Degenerative changes at STT joint and first CMC joint.  Small avulsion fracture identified on lateral view likely arising from the dorsal margin of the triquetrum.  Overlying soft tissue  swelling.  No additional fracture, dislocation or bone destruction.  IMPRESSION: Avulsion fracture from dorsal margin of RIGHT triquetrum with overlying soft tissue swelling.   Electronically Signed By: Ulyses Southward M.D. On: 04/23/2016 16:02   Narrative CLINICAL DATA:  Larey Seat down steps onto driveway earlier today, LEFT hand pain especially at thumb and little finger, initial encounter  EXAM: LEFT  HAND - COMPLETE 3+ VIEW  COMPARISON:  None  FINDINGS: Diffuse osseous demineralization.  Fingers superimposed on lateral view limiting assessment.  Suspected volar plate avulsion fracture at base of middle phalanx LEFT little finger, though this is partially obscured on the lateral view.  Minimal scattered degenerative changes of IP joints.  No additional fracture, dislocation, or bone destruction.  IMPRESSION: Probable volar plate avulsion fracture at base of middle phalanx LEFT little finger.  Osseous demineralization.   Electronically Signed By: Ulyses Southward M.D. On: 04/23/2016 16:00   Complexity Note: Imaging results reviewed.                         ROS  Cardiovascular: Abnormal heart rhythm and Blood thinners:  Anticoagulant Pulmonary or Respiratory: Shortness of breath and Snoring  Neurological: Abnormal skin sensations (Peripheral Neuropathy) Psychological-Psychiatric: Anxiousness, Depressed, Attempted suicide, History of abuse, and Difficulty sleeping and or falling asleep Gastrointestinal: Reflux or heatburn Genitourinary: Passing kidney stones and Peeing blood Hematological: Weakness due to low blood hemoglobin or red blood cell count (Anemia) and Brusing easily Endocrine: No reported endocrine signs or symptoms such as high or low blood sugar, rapid heart rate due to high thyroid levels, obesity or weight gain due to slow thyroid or thyroid disease Rheumatologic: Joint aches and or swelling due to excess weight (Osteoarthritis) and Generalized muscle aches (Fibromyalgia) Musculoskeletal: Negative for myasthenia gravis, muscular dystrophy, multiple sclerosis or malignant hyperthermia Work History: Retired  Allergies  Ms. Dusenbury is allergic to reglan [metoclopramide], ciprofloxacin, clindamycin/lincomycin, doxycycline, macrobid [nitrofurantoin monohyd macro], and sulfa antibiotics.  Laboratory Chemistry Profile   Renal Lab Results  Component Value Date    BUN 19 11/25/2022   CREATININE 0.84 11/25/2022   GFR 70.32 11/25/2022   GFRAA >60 09/03/2016   GFRNONAA >60 05/19/2022   SPECGRAV >=1.030 (A) 11/01/2019   PHUR 5.0 11/01/2019   PROTEINUR NEGATIVE 03/20/2021     Electrolytes Lab Results  Component Value Date   NA 143 11/25/2022   K 3.9 11/25/2022   CL 105 11/25/2022   CALCIUM 9.8 11/25/2022   MG 1.9 11/22/2015     Hepatic Lab Results  Component Value Date   AST 19 11/25/2022   ALT 19 11/25/2022   ALBUMIN 4.2 11/25/2022   ALKPHOS 51 11/25/2022   LIPASE 21 03/20/2021     ID Lab Results  Component Value Date   SARSCOV2NAA NEGATIVE 06/06/2021     Bone No results found for: "VD25OH", "VD125OH2TOT", "VH8469GE9", "BM8413KG4", "25OHVITD1", "25OHVITD2", "25OHVITD3", "TESTOFREE", "TESTOSTERONE"   Endocrine Lab Results  Component Value Date   GLUCOSE 96 11/25/2022   GLUCOSEU NEGATIVE 03/20/2021   TSH 0.73 06/06/2021     Neuropathy Lab Results  Component Value Date   VITAMINB12 960 (H) 06/18/2021     CNS No results found for: "COLORCSF", "APPEARCSF", "RBCCOUNTCSF", "WBCCSF", "POLYSCSF", "LYMPHSCSF", "EOSCSF", "PROTEINCSF", "GLUCCSF", "JCVIRUS", "CSFOLI", "IGGCSF", "LABACHR", "ACETBL"   Inflammation (CRP: Acute  ESR: Chronic) Lab Results  Component Value Date   CRP <1 02/14/2020   ESRSEDRATE 6 02/14/2020   LATICACIDVEN 1.06 09/03/2016     Rheumatology Lab Results  Component Value Date   ANA Positive (A) 02/14/2020     Coagulation Lab Results  Component Value Date   INR 1.1 06/06/2021   LABPROT 14.0 06/06/2021   APTT 28 06/06/2021   PLT 244.0 11/25/2022     Cardiovascular Lab Results  Component Value Date   CKTOTAL 116 02/14/2020   HGB 14.2 11/25/2022   HCT 42.4 11/25/2022     Screening Lab Results  Component Value Date   SARSCOV2NAA NEGATIVE 06/06/2021     Cancer No results found for: "CEA", "CA125", "LABCA2"   Allergens No results found for: "ALMOND", "APPLE", "ASPARAGUS", "AVOCADO",  "BANANA", "BARLEY", "BASIL", "BAYLEAF", "GREENBEAN", "LIMABEAN", "WHITEBEAN", "BEEFIGE", "REDBEET", "BLUEBERRY", "BROCCOLI", "CABBAGE", "MELON", "CARROT", "CASEIN", "CASHEWNUT", "CAULIFLOWER", "CELERY"     Note: Lab results reviewed.  PFSH  Drug: Ms. Degrave  reports no history of drug use. Alcohol:  reports no history of alcohol use. Tobacco:  reports that she has never smoked. She has never used smokeless tobacco. Medical:  has a past medical history of Anemia, Arthritis, Atrial fibrillation (HCC), Chicken pox, Chronic pain syndrome, Colon polyp, Depression, Dysrhythmia, Dystonia, Fibromyalgia, GERD (gastroesophageal reflux disease), Goiter, Hyperlipidemia, Stroke (HCC), TIA (transient ischemic attack), and Vaginal atrophy. Family: family history includes Alcohol abuse in her father and mother; Cancer in her brother and brother; Diabetes in her brother; Hypertension in her father; Pancreatic cancer in her father; Skin cancer in her brother.  Past Surgical History:  Procedure Laterality Date   ATRIAL FIBRILLATION ABLATION     BIOPSY THYROID     fusion l4-l5  09/22/1998   GLUTEUS MINIMUS REPAIR Right 05/19/2022   Procedure: RIGHT HIP GLUTEUS MEDIUS TENDON REPAIR;  Surgeon: Huel Cote, MD;  Location: [facility] OR;  Service: Orthopedics;  Laterality: Right;   LAMINECTOMY     L4-L5   OTHER SURGICAL HISTORY     tennis elbow surgery   piriformis release  09/22/1997   right hip   TONSILLECTOMY AND ADENOIDECTOMY  age 15   VAGINAL HYSTERECTOMY  09/23/1991   Active Ambulatory Problems    Diagnosis Date Noted   Atrial fibrillation (HCC)    Chronic pain syndrome    Hyperlipidemia    History of SI/intentional drug overdose 11/22/2015   GERD (gastroesophageal reflux disease) 01/30/2016   Major depression in full remission (HCC) 09/02/2016   Recurrent UTI 10/07/2016   Restless legs 07/27/2018   Aortic atherosclerosis (HCC) 02/07/2020   Dystonia    Delayed gastric emptying 02/07/2020    Involuntary movements 02/14/2020   Tear of right gluteus medius tendon    Seasonal allergic rhinitis due to pollen 01/01/2023   History of lumbar fusion 03/31/2023   Chronic radicular lumbar pain 03/31/2023   Sacroiliac joint pain 03/31/2023   Resolved Ambulatory Problems    Diagnosis Date Noted   Vaginal atrophy    Dystonia    Depression    GERD (gastroesophageal reflux disease)    Colon polyp    TIA (transient ischemic attack) 01/18/2015   Goiter    Arthritis    Fibromyalgia 02/06/2015   Suicidal intent 11/22/2015   Acute kidney injury (HCC) 11/22/2015   Suicide attempt by drug ingestion (HCC)    Depression with anxiety 02/14/2020   Sore in nose 07/30/2021   Viral URI with cough 11/03/2022   Past Medical History:  Diagnosis Date   Anemia    Chicken pox    Dysrhythmia    Stroke (HCC)    Constitutional Exam  General appearance: Well nourished, well developed, and well hydrated. In  no apparent acute distress Vitals:   03/31/23 0955  BP: (!) 127/59  Pulse: 65  Temp: (!) 97.2 F (36.2 C)  TempSrc: Temporal  SpO2: 99%  Weight: 150 lb (68 kg)  Height: 5\' 8"  (1.727 m)   BMI Assessment: Estimated body mass index is 22.81 kg/m as calculated from the following:   Height as of this encounter: 5\' 8"  (1.727 m).   Weight as of this encounter: 150 lb (68 kg).  BMI interpretation table: BMI level Category Range association with higher incidence of chronic pain  <18 kg/m2 Underweight   18.5-24.9 kg/m2 Ideal body weight   25-29.9 kg/m2 Overweight Increased incidence by 20%  30-34.9 kg/m2 Obese (Class I) Increased incidence by 68%  35-39.9 kg/m2 Severe obesity (Class II) Increased incidence by 136%  >40 kg/m2 Extreme obesity (Class III) Increased incidence by 254%   Patient's current BMI Ideal Body weight  Body mass index is 22.81 kg/m. Ideal body weight: 63.9 kg (140 lb 14 oz) Adjusted ideal body weight: 65.6 kg (144 lb 8.4 oz)   BMI Readings from Last 4 Encounters:   03/31/23 22.81 kg/m  02/23/23 22.81 kg/m  02/11/23 23.11 kg/m  01/01/23 22.72 kg/m   Wt Readings from Last 4 Encounters:  03/31/23 150 lb (68 kg)  02/23/23 150 lb (68 kg)  02/11/23 152 lb (68.9 kg)  01/01/23 149 lb 6.4 oz (67.8 kg)    Psych/Mental status: Alert, oriented x 3 (person, place, & time)       Eyes: PERLA Respiratory: No evidence of acute respiratory distress  Thoracic Spine Area Exam  Skin & Axial Inspection: No masses, redness, or swelling Alignment: Symmetrical Functional ROM: Pain restricted ROM Stability: No instability detected Muscle Tone/Strength: Functionally intact. No obvious neuro-muscular anomalies detected. Sensory (Neurological): Unimpaired Muscle strength & Tone: No palpable anomalies Lumbar Spine Area Exam  Skin & Axial Inspection: Well healed scar from previous spine surgery detected Alignment: Symmetrical Functional ROM: Pain restricted ROM       Stability: No instability detected Muscle Tone/Strength: Functionally intact. No obvious neuro-muscular anomalies detected. Sensory (Neurological): Musculoskeletal pain pattern and possibly neurogenic component  Gait & Posture Assessment  Ambulation: Unassisted Gait: Relatively normal for age and body habitus Posture: WNL  Lower Extremity Exam    Side: Right lower extremity  Side: Left lower extremity  Stability: No instability observed          Stability: No instability observed          Skin & Extremity Inspection: Skin color, temperature, and hair growth are WNL. No peripheral edema or cyanosis. No masses, redness, swelling, asymmetry, or associated skin lesions. No contractures.  Skin & Extremity Inspection: Skin color, temperature, and hair growth are WNL. No peripheral edema or cyanosis. No masses, redness, swelling, asymmetry, or associated skin lesions. No contractures.  Functional ROM: Pain restricted ROM for hip and knee joints pain with hip abduction          Functional ROM: Pain  restricted ROM for hip and knee joints pain with hip abduction          Muscle Tone/Strength: Functionally intact. No obvious neuro-muscular anomalies detected.  Muscle Tone/Strength: Functionally intact. No obvious neuro-muscular anomalies detected.  Sensory (Neurological): Musculoskeletal pain pattern        Sensory (Neurological): Musculoskeletal pain pattern        DTR: Patellar: deferred today Achilles: deferred today Plantar: deferred today  DTR: Patellar: deferred today Achilles: deferred today Plantar: deferred today  Palpation: No palpable anomalies  Palpation: No palpable anomalies    Assessment  Primary Diagnosis & Pertinent Problem List: The primary encounter diagnosis was Chronic pain syndrome. Diagnoses of History of lumbar fusion, Chronic radicular lumbar pain, and Sacroiliac joint pain were also pertinent to this visit.  Visit Diagnosis (New problems to examiner): 1. Chronic pain syndrome   2. History of lumbar fusion   3. Chronic radicular lumbar pain   4. Sacroiliac joint pain    Plan of Care (Initial workup plan)  Patient endorses lower back pain and bilateral hip pain.  She has a history of L4-L5 spinal fusion.  She has had bilateral greater trochanteric bursa injections done recently which were not helpful.  I recommend further diagnostic workup via lumbar MRI as well as bilateral SI joint x-ray.  I recommend that she start Lyrica as below.  After lumbar MRI has been completed, I will review results with her and we can discuss treatment plan which may include spinal injections, SI joint injection, spinal cord stimulator trial.  Future considerations for medication management may include belbuca versus Butrans..  Avoid opioid analgesics given high risk scores on the opioid risk reduction tool above.    Imaging Orders         MR LUMBAR SPINE WO CONTRAST         DG Si Joints      Pharmacotherapy (current): Medications ordered:  Meds ordered this encounter   Medications   pregabalin (LYRICA) 25 MG capsule    Sig: Take 1 capsule (25 mg total) by mouth at bedtime for 15 days, THEN 2 capsules (50 mg total) at bedtime.    Dispense:  75 capsule    Refill:  0   Medications administered during this visit: Rosine Door had no medications administered during this visit.   Analgesic Pharmacotherapy:  Opioid Analgesics: For patients currently taking or requesting to take opioid analgesics, in accordance with [facility] Guidelines, we will assess their risks and indications for the use of these substances. After completing our evaluation, we may offer recommendations, but we no longer take patients for medication management. The prescribing physician will ultimately decide, based on his/her training and level of comfort whether to adopt any of the recommendations, including whether or not to prescribe such medicines.  Membrane stabilizer:  Lyrica as above  Muscle relaxant: To be determined at a later time  NSAID:  Avoid given patient is on Xarelto  Other analgesic(s): To be determined at a later time   Interventional management options: Ms. Kubit was informed that there is no guarantee that she would be a candidate for interventional therapies. The decision will be based on the results of diagnostic studies, as well as Ms. Sonntag's risk profile.  Procedure(s) under consideration:  Pending results of ordered studies   Provider-requested follow-up: Return in about 5 weeks (around 05/05/2023) for 2nd pt visit (review L-MRI).  Future Appointments  Date Time Provider Department Center  04/21/2023  1:30 PM Martina Sinner, MD [facility] None  06/16/2023  9:00 AM Shelva Majestic, MD [facility]  08/14/2023  9:00 AM Gwynneth Munson Sung Amabile, PA-C [facility] None    Duration of encounter: .  Total time on encounter, as per AMA guidelines included both the face-to-face and non-face-to-face time personally spent by the physician  and/or other qualified health care professional(s) on the day of the encounter (includes time in activities that require the physician or other qualified health care professional and does not include time in activities normally performed  by clinical staff). Physician's time may include the following activities when performed: Preparing to see the patient (e.g., pre-charting review of records, searching for previously ordered imaging, lab work, and nerve conduction tests) Review of prior analgesic pharmacotherapies. Reviewing PMP Interpreting ordered tests (e.g., lab work, imaging, nerve conduction tests) Performing post-procedure evaluations, including interpretation of diagnostic procedures Obtaining and/or reviewing separately obtained history Performing a medically appropriate examination and/or evaluation Counseling and educating the patient/family/caregiver Ordering medications, tests, or procedures Referring and communicating with other health care professionals (when not separately reported) Documenting clinical information in the electronic or other health record Independently interpreting results (not separately reported) and communicating results to the patient/ family/caregiver Care coordination (not separately reported)  Note by: Edward Jolly, MD (TTS technology used. I apologize for any typographical errors that were not detected and corrected.) Date: 03/31/2023; Time: 11:53 AM

## 2023-03-31 NOTE — Progress Notes (Signed)
Safety precautions to be maintained throughout the outpatient stay will include: orient to surroundings, keep bed in low position, maintain call bell within reach at all times, provide assistance with transfer out of bed and ambulation.  

## 2023-04-02 NOTE — Therapy (Signed)
OUTPATIENT OCCUPATIONAL THERAPY ORTHO EVALUATION  Patient Name: Danielle Rocha MRN: [id number] DOB:[birth year], 72 y.o., female Today's Date: 04/03/2023  PCP: Tana Conch, MD REFERRING PROVIDER:  Madelyn Brunner, DO    END OF SESSION:  OT End of Session - 04/03/23 0852     Visit Number 1    Number of Visits 7    Date for OT Re-Evaluation 05/15/23    Authorization Type [employer]    OT Start Time [phone]    OT Stop Time 0938    OT Time Calculation (min) 46 min    Activity Tolerance Patient tolerated treatment well;No increased pain;Patient limited by fatigue;Patient limited by pain    Behavior During Therapy [facility] for tasks assessed/performed             Past Medical History:  Diagnosis Date   Anemia    Arthritis    DJD, low back, thumb   Atrial fibrillation (HCC)    Xarelto anticoagulation. Flecainide antiarrythmic    Chicken pox    Chronic pain syndrome    On disability. History of bilateral hip pain, low back pain. Gabapentin 400 BID, percocet 1 tablet daily per prior provider.    Colon polyp    awaiting records   Depression    zoloft 100mg , remeron 30mg  per psychiatry. ambien 10mg  per psychiatry to help wtih sleep element.    Dysrhythmia    Dystonia    described as psychogenic dystonia. Pain and twisting from upper chest and up with triggers "wind, creamy food" on TID ativan per psychiatry previously.    Fibromyalgia    GERD (gastroesophageal reflux disease)    omeprazole OTC   Goiter    states multiple imaging tests, has had biopsies   Hyperlipidemia    lovastatin 20mg    Stroke (HCC)    TIA (left side of face and body decreased sensitivity than right face and side)   TIA (transient ischemic attack)    Vaginal atrophy    estrace vaginal cream   Past Surgical History:  Procedure Laterality Date   ATRIAL FIBRILLATION ABLATION     BIOPSY THYROID     fusion l4-l5  09/22/1998   GLUTEUS MINIMUS REPAIR Right 05/19/2022   Procedure: RIGHT HIP GLUTEUS MEDIUS  TENDON REPAIR;  Surgeon: Huel Cote, MD;  Location: [facility] OR;  Service: Orthopedics;  Laterality: Right;   LAMINECTOMY     L4-L5   OTHER SURGICAL HISTORY     tennis elbow surgery   piriformis release  09/22/1997   right hip   TONSILLECTOMY AND ADENOIDECTOMY  age 27   VAGINAL HYSTERECTOMY  09/23/1991   Patient Active Problem List   Diagnosis Date Noted   History of lumbar fusion 03/31/2023   Chronic radicular lumbar pain 03/31/2023   Sacroiliac joint pain 03/31/2023   Seasonal allergic rhinitis due to pollen 01/01/2023   Tear of right gluteus medius tendon    Involuntary movements 02/14/2020   Aortic atherosclerosis (HCC) 02/07/2020   Delayed gastric emptying 02/07/2020   Dystonia    Restless legs 07/27/2018   Recurrent UTI 10/07/2016   Major depression in full remission (HCC) 09/02/2016   GERD (gastroesophageal reflux disease) 01/30/2016   History of SI/intentional drug overdose 11/22/2015   Atrial fibrillation (HCC)    Chronic pain syndrome    Hyperlipidemia     ONSET DATE: 01/30/23 DOI  REFERRING DIAG: U44.034V (ICD-10-CM) - Closed displaced fracture of triquetrum of left wrist with routine healing, subsequent encounter   THERAPY DIAG:  Muscle weakness (generalized)  Other lack of coordination  Pain in left wrist  Stiffness of left wrist, not elsewhere classified  Rationale for Evaluation and Treatment: Rehabilitation  SUBJECTIVE:   SUBJECTIVE STATEMENT: She arrives a bit late,  states falling May 6th, 2024 after slipping off sidewalk curb. She is still swollen and ironically the base of her thumb (radial side) is more painful that ulnar wrist.  She has apparent Rt thumb CMC J OA with boney changes, but Lt thumb no overt boney changes.  She does state a significant amount of falls in the past 6 months that may be linked to haste and/or hip issues and chronic pain.  She has had 3 right-sided hip surgeries in the past.   PERTINENT HISTORY: 8 weeks post Lt  triquetral fracture and L 4th rib fx.  Was in cast for ~6 weeks, mildly displaced, does not have pain over this location, just some generalized soreness and stiffness about the wrist, lso hx of bil hip pain and cervical radiculopathy   PRECAUTIONS: Other: mild fall risk, hx hip pain  RED FLAGS: None   WEIGHT BEARING RESTRICTIONS: Yes <10# recommended for next month  PAIN:  Are you having pain? Yes: NPRS scale: 2/10 at rest, at worst up to 4-5/10 Pain location: radial Lt wrist by basal joint of thumb Pain description: aching Aggravating factors: gripping Relieving factors: rest, heat  FALLS: Has patient fallen in last 6 months? Yes. Number of falls 3-4 in past- mild fall risk- given reducing fall risk recommendations    LIVING ENVIRONMENT: Lives with: lives with their spouse  PLOF: Independent  PATIENT GOALS: To decrease pain in the left hand and arm and increase ability.  She would also like to learn some pain management techniques for her right hip if possible.   OBJECTIVE: (All objective assessments below are from initial evaluation on: 04/03/23 unless otherwise specified.)   HAND DOMINANCE: Right   ADLs: Overall ADLs: States decreased ability to grab, hold household objects, pain and inability to open containers, perform FMS tasks (manipulate fasteners on clothing), mild to moderate bathing problems as well.    FUNCTIONAL OUTCOME MEASURES: Eval: Quck DASH 30% impairment today  (Higher % Score  =  More Impairment)     UPPER EXTREMITY ROM     Shoulder to Wrist AROM Left eval  Shoulder flexion   Shoulder abduction   Shoulder extension   Shoulder internal rotation   Shoulder external rotation   Elbow flexion   Elbow extension   Forearm supination 81  Forearm pronation  75  Wrist flexion 47  Wrist extension 54  Wrist ulnar deviation 29  Wrist radial deviation 19  Functional dart thrower's motion (F-DTM) in ulnar flexion 40  F-DTM in radial extension  45  (Blank  rows = not tested)   Hand AROM Left eval  Full Fist Ability (or Gap to Distal Palmar Crease) full  Thumb Opposition  (Kapandji Scale)  9/10  (Blank rows = not tested)   UPPER EXTREMITY MMT:    Eval:  NT at eval due to still healing injuries, but grossly 3+/5 with tenderness in Lt hand/wrist from observations . Will be tested further in next 1-2 weeks   MMT Left TBD  Shoulder flexion   Shoulder abduction   Shoulder adduction   Shoulder extension   Shoulder internal rotation   Shoulder external rotation   Middle trapezius   Lower trapezius   Elbow flexion   Elbow extension   Forearm supination   Forearm pronation  Wrist flexion   Wrist extension   Wrist ulnar deviation   Wrist radial deviation   (Blank rows = not tested)  HAND FUNCTION: Eval: Observed weakness in affected hand.  Grip strength Right: 52 lbs, Left: 30 lbs pain  COORDINATION: Eval: Possible mild observed coordination impairments with affected Lt hand.  Details to be determined as patient status indicates  SENSATION: Eval:  Light touch intact today, mild difference in light touch in Lt arm (perhaps from swelling)   EDEMA:   Eval: Mildly swollen in Lt hand and wrist today  COGNITION: Eval: Overall cognitive status: WFL for evaluation today   OBSERVATIONS:   Eval: Slightly red and swollen on the ulnar side of the wrist left side.  Stiffness of motion and tenderness mainly to the left thumb CMC joint.   TODAY'S TREATMENT:  Post-evaluation treatment: She was given the following home exercise program to upgrade what she is currently been doing that was assigned by her doctor.  These include upgraded stretches to the forearm, modifications to wrist stretches, additional thumb stretch and modifying gripping and rotational forearm motion and strengthening.  She tolerates all these today and states understanding and was highly encouraged not to cause pain with these but feel a light tension hold a long  stretch and relax.  She should do these at least 2-3 times a day.  She was also given education on how to prevent falls in the home which was included in her exercise packet.  Additionally for right hip pain and problems she was educated on a gentle crosslegged stretch for the piriformis and lateral hip which she states she can do without pain and feels beneficial.  For her safety, she was recommended to not do any heavy or painful lifting likely nothing more than 10 pounds right now, but she should include her hand and all safe and nonpainful daily activities-she states understanding   Exercises - Forearm Supination Stretch  - 3-4 x daily - 3-5 reps - 15 sec hold - Forearm Pronation Stretch  - 3-4 x daily - 3-5 reps - 15 sec hold - Wrist Flexion Stretch  - 4 x daily - 3-5 reps - 15 sec hold - Wrist Prayer Stretch  - 4 x daily - 3-5 reps - 15 sec hold - Seated Composite Thumb Flexion PROM  - 2-3 x daily - 3-5 reps - 15 sec hold - Towel Roll Grip with Forearm in Neutral  - 3 x daily - 5 reps - 10 sec hold - Hammer Stretch or Strength   - 2-4 x daily - 1-2 sets - 10-15 reps  Patient Education - How to Prevent Falls    PATIENT EDUCATION: Education details: See tx section above for details  Person educated: Patient Education method: Engineer, structural, Teach back, Handouts  Education comprehension: States and demonstrates understanding, Additional Education required    HOME EXERCISE PROGRAM: Access Code: [email or web address] URL: [email or web address] Date: 04/03/2023 Prepared by: Fannie Knee   GOALS: Goals reviewed with patient? Yes   SHORT TERM GOALS: (STG required if POC>30 days) Target Date: 04/17/23  Pt will obtain protective, custom orthotic. Goal status: TBD/PRN  2.  Pt will demo/state understanding of initial HEP to improve pain levels and prerequisite motion. Goal status: INITIAL   LONG TERM GOALS: Target Date: 05/15/23  Pt will improve functional ability by  decreased impairment per Quick DASH assessment from 30% to 12% or better, for better quality of life. Goal status: INITIAL  2.  Pt will improve grip  strength in Lt hand from painful 30lbs to at least 45lbs for functional use at home and in IADLs. Goal status: INITIAL  3.  Pt will improve A/ROM in Lt wrist flex/ext from 47*/54* to at least 65* each, to have functional motion for tasks like reach and grasp.  Goal status: INITIAL  4.  Pt will improve strength in Lt wrist flex/ext from 3+/5 tender MMT to at least 4+/5 MMT to have increased functional ability to carry out selfcare and higher-level homecare tasks with no difficulty. Goal status: INITIAL  5.  Pt will decrease pain at worst from 4-5/10 to 2/10 or better to have better sleep and occupational participation in daily roles. Goal status: INITIAL   ASSESSMENT:  CLINICAL IMPRESSION: Patient is a 72 y.o. female who was seen today for occupational therapy evaluation for left wrist fracture of triquetrum and also apparent exacerbation of left thumb CMC joint arthritis with decreased strength and functional ability.  She will benefit from outpatient occupational therapy to increase quality of life.   PERFORMANCE DEFICITS: in functional skills including coordination, dexterity, edema, ROM, strength, pain, fascial restrictions, flexibility, Gross motor control, body mechanics, endurance, and UE functional use, cognitive skills including  none , and psychosocial skills including coping strategies, environmental adaptation, and habits.   IMPAIRMENTS: are limiting patient from ADLs, IADLs, rest and sleep, leisure, and social participation.   COMORBIDITIES: may have co-morbidities  that affects occupational performance. Patient will benefit from skilled OT to address above impairments and improve overall function.  MODIFICATION OR ASSISTANCE TO COMPLETE EVALUATION: No modification of tasks or assist necessary to complete an evaluation.  OT  OCCUPATIONAL PROFILE AND HISTORY: Problem focused assessment: Including review of records relating to presenting problem.  CLINICAL DECISION MAKING: LOW - limited treatment options, no task modification necessary  REHAB POTENTIAL: Excellent  EVALUATION COMPLEXITY: Low      PLAN:  OT FREQUENCY: 1x/week  OT DURATION: 6 weeks through 05/15/23 as needed   PLANNED INTERVENTIONS: self care/ADL training, therapeutic exercise, therapeutic activity, neuromuscular re-education, manual therapy, splinting, ultrasound, fluidotherapy, compression bandaging, moist heat, cryotherapy, contrast bath, patient/family education, Re-evaluation, and Dry needling  RECOMMENDED OTHER SERVICES: none now   CONSULTED AND AGREED WITH PLAN OF CARE: Patient  PLAN FOR NEXT SESSION:   Review initial home exercises, perform manual therapy and modalities as helpful towards pain and regaining motion (consider joint mobs of the thumb and wrist slightly).  Upgrade to wrist flexion and extension isometrics as tolerated.   Fannie Knee, OTR/L  04/03/2023, 10:53 AM

## 2023-04-03 ENCOUNTER — Other Ambulatory Visit: Payer: Self-pay

## 2023-04-03 ENCOUNTER — Ambulatory Visit: Payer: Medicare HMO | Admitting: Rehabilitative and Restorative Service Providers"

## 2023-04-03 ENCOUNTER — Encounter: Payer: Self-pay | Admitting: Rehabilitative and Restorative Service Providers"

## 2023-04-03 DIAGNOSIS — M6281 Muscle weakness (generalized): Secondary | ICD-10-CM | POA: Diagnosis not present

## 2023-04-03 DIAGNOSIS — M25532 Pain in left wrist: Secondary | ICD-10-CM

## 2023-04-03 DIAGNOSIS — R278 Other lack of coordination: Secondary | ICD-10-CM

## 2023-04-03 DIAGNOSIS — M25632 Stiffness of left wrist, not elsewhere classified: Secondary | ICD-10-CM

## 2023-04-08 ENCOUNTER — Ambulatory Visit (HOSPITAL_COMMUNITY)
Admission: RE | Admit: 2023-04-08 | Discharge: 2023-04-08 | Disposition: A | Discharge status: Home / Self Care | Payer: Medicare HMO | Source: Ambulatory Visit | Attending: Student in an Organized Health Care Education/Training Program | Admitting: Student in an Organized Health Care Education/Training Program

## 2023-04-08 DIAGNOSIS — Z981 Arthrodesis status: Secondary | ICD-10-CM | POA: Diagnosis not present

## 2023-04-08 DIAGNOSIS — M5136 Other intervertebral disc degeneration, lumbar region: Secondary | ICD-10-CM | POA: Diagnosis not present

## 2023-04-08 DIAGNOSIS — G8929 Other chronic pain: Secondary | ICD-10-CM | POA: Insufficient documentation

## 2023-04-08 DIAGNOSIS — M5126 Other intervertebral disc displacement, lumbar region: Secondary | ICD-10-CM | POA: Diagnosis not present

## 2023-04-08 DIAGNOSIS — M48061 Spinal stenosis, lumbar region without neurogenic claudication: Secondary | ICD-10-CM | POA: Diagnosis not present

## 2023-04-08 DIAGNOSIS — M47816 Spondylosis without myelopathy or radiculopathy, lumbar region: Secondary | ICD-10-CM | POA: Diagnosis not present

## 2023-04-08 DIAGNOSIS — M5416 Radiculopathy, lumbar region: Secondary | ICD-10-CM | POA: Insufficient documentation

## 2023-04-09 ENCOUNTER — Encounter: Payer: Medicare HMO | Admitting: Rehabilitative and Restorative Service Providers"

## 2023-04-21 ENCOUNTER — Encounter: Payer: Self-pay | Admitting: Pulmonary Disease

## 2023-04-21 ENCOUNTER — Ambulatory Visit: Payer: Medicare HMO | Admitting: Pulmonary Disease

## 2023-04-21 ENCOUNTER — Encounter: Payer: Self-pay | Admitting: Student in an Organized Health Care Education/Training Program

## 2023-04-21 ENCOUNTER — Ambulatory Visit
Payer: Medicare HMO | Attending: Student in an Organized Health Care Education/Training Program | Admitting: Student in an Organized Health Care Education/Training Program

## 2023-04-21 VITALS — BP 110/60 | HR 64 | Temp 97.2°F | Resp 16 | Ht 68.0 in | Wt 150.0 lb

## 2023-04-21 VITALS — BP 116/68 | HR 68 | Ht 68.0 in | Wt 154.0 lb

## 2023-04-21 DIAGNOSIS — R053 Chronic cough: Secondary | ICD-10-CM | POA: Diagnosis not present

## 2023-04-21 DIAGNOSIS — G894 Chronic pain syndrome: Secondary | ICD-10-CM | POA: Insufficient documentation

## 2023-04-21 DIAGNOSIS — M1990 Unspecified osteoarthritis, unspecified site: Secondary | ICD-10-CM | POA: Diagnosis not present

## 2023-04-21 DIAGNOSIS — K219 Gastro-esophageal reflux disease without esophagitis: Secondary | ICD-10-CM

## 2023-04-21 DIAGNOSIS — Z981 Arthrodesis status: Secondary | ICD-10-CM | POA: Diagnosis not present

## 2023-04-21 DIAGNOSIS — G8929 Other chronic pain: Secondary | ICD-10-CM | POA: Insufficient documentation

## 2023-04-21 DIAGNOSIS — M5416 Radiculopathy, lumbar region: Secondary | ICD-10-CM | POA: Diagnosis not present

## 2023-04-21 LAB — SEDIMENTATION RATE: Sed Rate: 16 mm/hr (ref 0–30)

## 2023-04-21 MED ORDER — GABAPENTIN 300 MG PO CAPS: 300.0000 mg | ORAL_CAPSULE | Freq: Three times a day (TID) | ORAL | 1 refills | Status: DC

## 2023-04-21 MED ORDER — FLUTICASONE-SALMETEROL 115-21 MCG/ACT IN AERO
2.0000 | INHALATION_SPRAY | Freq: Two times a day (BID) | RESPIRATORY_TRACT | 12 refills | Status: DC
Start: 2023-04-21 — End: 2023-04-30

## 2023-04-21 MED ORDER — IPRATROPIUM BROMIDE 0.03 % NA SOLN
2.0000 | Freq: Two times a day (BID) | NASAL | 12 refills | Status: AC
Start: 2023-04-21 — End: ?

## 2023-04-21 NOTE — Patient Instructions (Signed)

## 2023-04-21 NOTE — Progress Notes (Signed)
PROVIDER NOTE: Information contained herein reflects review and annotations entered in association with encounter. Interpretation of such information and data should be left to medically-trained personnel. Information provided to patient can be located elsewhere in the medical record under "Patient Instructions". Document created using STT-dictation technology, any transcriptional errors that may result from process are unintentional.    Patient: Danielle Rocha  Service Category: E/M  Provider: Edward Jolly, MD  DOB: 08-14-1951  DOS: 04/21/2023  Referring Provider: Shelva Majestic, MD  MRN: [id number]  Specialty: Interventional Pain Management  PCP: Shelva Majestic, MD  Type: Established Patient  Setting: Ambulatory outpatient    Location: Office  Delivery: Face-to-face     HPI  Ms. Danielle Rocha, a 72 y.o. year old female, is here today because of her Chronic pain syndrome [G89.4]. Ms. Thon primary complain today is Back Pain (lower)   Pain Assessment: Severity of Chronic pain is reported as a 7 /10. Location: Back Lower/both hips and both upper legs. Onset: More than a month ago. Quality: Aching, Dull. Timing: Constant. Modifying factor(s): rest, ice, heat. Vitals:  height is 5\' 8"  (1.727 m) and weight is 150 lb (68 kg). Her temporal temperature is 97.2 F (36.2 C) (abnormal). Her blood pressure is 110/60 and her pulse is 64. Her respiration is 16 and oxygen saturation is 100%.  BMI: Estimated body mass index is 22.81 kg/m as calculated from the following:   Height as of this encounter: 5\' 8"  (1.727 m).   Weight as of this encounter: 150 lb (68 kg). Last encounter: 03/31/2023. Last procedure: Visit date not found.  Reason for encounter: review L-MRI  HPI from initial clinic visit: Ms. Rowekamp is being evaluated for possible interventional pain management therapies for the treatment of her chronic pain.    Danielle Rocha is a pleasant 72 year old female who presents with a chief complaint  of low back pain as well as bilateral hip pain that has been going on for many years.  Of note she states that she woke up 1 morning in 1998 with excruciating right hip pain that soon after also progressed to her left hip.  She has bilateral hip pain for greater than 20 years.  She had a right piriformis release in 1999.  She subsequently had a L4-L5 lumbar spinal fusion in 2000.  She tore her medial gluteal muscle in 2023 and had a subsequent surgical repair.  She was previously seen at a pain clinic in [place] where they did spinal injections, facet blocks with limited response.  She states that she has tried fentanyl patches, Percocet as well as other opioid analgesics in the past and she did not like how it made her feel nor did it help with her pain.  She has also tried gabapentin in the past.  She has not tried Lyrica.  She has a history of depression.  Family history of alcohol and substance abuse.  She is on Xarelto for atrial fibrillation.   She recently had trochanteric bursa injections under ultrasound guidance with orthopedics.  She states that these were somewhat helpful.  ROS  Constitutional: Denies any fever or chills Gastrointestinal: No reported hemesis, hematochezia, vomiting, or acute GI distress Musculoskeletal:  Low back and radiating bilateral hip pain Neurological: No reported episodes of acute onset apraxia, aphasia, dysarthria, agnosia, amnesia, paralysis, loss of coordination, or loss of consciousness  Medication Review  Coenzyme Q10, Cyanocobalamin, Erenumab-aooe, LORazepam, Magnesium Oxide, SUMAtriptan, Vitamin D, acetaminophen, buPROPion, cephALEXin, flecainide, fluticasone, gabapentin, melatonin, multivitamin with minerals, ondansetron,  pantoprazole, pyridOXINE, rivaroxaban, rosuvastatin, sertraline, and traZODone  History Review  Allergy: [name] is allergic to reglan [metoclopramide], ciprofloxacin, clindamycin/lincomycin, doxycycline, macrobid [nitrofurantoin  monohyd macro], and sulfa antibiotics. Drug: [name]  reports no history of drug use. Alcohol:  reports no history of alcohol use. Tobacco:  reports that she has never smoked. She has never used smokeless tobacco. Social: Ms. [name]  reports that she has never smoked. She has never used smokeless tobacco. She reports that she does not drink alcohol and does not use drugs. Medical:  has a past medical history of Anemia, Arthritis, Atrial fibrillation (HCC), Chicken pox, Chronic pain syndrome, Colon polyp, Depression, Dysrhythmia, Dystonia, Fibromyalgia, GERD (gastroesophageal reflux disease), Goiter, Hyperlipidemia, Stroke (HCC), TIA (transient ischemic attack), and Vaginal atrophy. Surgical: [name]  has a past surgical history that includes piriformis release (09/22/1997); fusion l4-l5 (09/22/1998); Tonsillectomy and adenoidectomy (age 55); Vaginal hysterectomy (09/23/1991); Other surgical history; Biopsy thyroid; Laminectomy; Atrial fibrillation ablation; and Gluteus minimus repair (Right, 05/19/2022). Family: family history includes Alcohol abuse in her father and mother; Cancer in her brother and brother; Diabetes in her brother; Hypertension in her father; Pancreatic cancer in her father; Skin cancer in her brother.  Laboratory Chemistry Profile   Renal Lab Results  Component Value Date   BUN 19 11/25/2022   CREATININE 0.84 11/25/2022   GFR 70.32 11/25/2022   GFRAA >60 09/03/2016   GFRNONAA >60 05/19/2022    Hepatic Lab Results  Component Value Date   AST 19 11/25/2022   ALT 19 11/25/2022   ALBUMIN 4.2 11/25/2022   ALKPHOS 51 11/25/2022   LIPASE 21 03/20/2021    Electrolytes Lab Results  Component Value Date   NA 143 11/25/2022   K 3.9 11/25/2022   CL 105 11/25/2022   CALCIUM 9.8 11/25/2022   MG 1.9 11/22/2015    Bone No results found for: "VD25OH", "VD125OH2TOT", "UE4540JW1", "XB1478GN5", "25OHVITD1", "25OHVITD2", "25OHVITD3", "TESTOFREE", "TESTOSTERONE"   Inflammation (CRP: Acute Phase) (ESR: Chronic Phase) Lab Results  Component Value Date   CRP <1 02/14/2020   ESRSEDRATE 6 02/14/2020   LATICACIDVEN 1.06 09/03/2016         Note: Above Lab results reviewed.  Recent Imaging Review  MR LUMBAR SPINE WO CONTRAST CLINICAL DATA:  Low back pain for 6 weeks.  EXAM: MRI LUMBAR SPINE WITHOUT CONTRAST  TECHNIQUE: Multiplanar, multisequence MR imaging of the lumbar spine was performed. No intravenous contrast was administered.  COMPARISON:  None Available.  FINDINGS: Segmentation:  Standard.  Alignment:  2 mm retrolisthesis of L2 on L3.  Vertebrae: No acute fracture, evidence of discitis, or aggressive bone lesion.  Conus medullaris and cauda equina: Conus extends to the L1 level. Conus and cauda equina appear normal.  Paraspinal and other soft tissues: No acute paraspinal abnormality.  Disc levels:  Disc spaces: Posterior lumbar interbody fusion and decompression at L4-5.  T12-L1: No significant disc bulge. No neural foraminal stenosis. No central canal stenosis.  L1-L2: Minimal broad-based disc bulge. Mild bilateral facet arthropathy. No foraminal or central canal stenosis.  L2-L3: Broad-based disc bulge flattening the ventral thecal sac. Moderate bilateral facet arthropathy. Moderate spinal stenosis. Bilateral subarticular recess stenosis. No foraminal stenosis.  L3-L4: Broad-based disc bulge. Moderate bilateral facet arthropathy. Mild spinal stenosis. Bilateral lateral recess stenosis. Mild bilateral foraminal stenosis.  L4-L5: Interbody fusion and posterior decompression. No foraminal or central canal stenosis. Central canal is partially obscured by susceptibility artifact resulting from the orthopedic hardware.  L5-S1: Minimal broad-based disc bulge. Moderate bilateral facet arthropathy.  No foraminal or central canal stenosis.  IMPRESSION: 1. At L2-3 there is a broad-based disc bulge flattening the  ventral thecal sac. Moderate bilateral facet arthropathy. Moderate spinal stenosis. Bilateral subarticular recess stenosis. 2. At L3-4 there is a broad-based disc bulge. Moderate bilateral facet arthropathy. Mild spinal stenosis. Bilateral lateral recess stenosis. Mild bilateral foraminal stenosis. 3. At L4-5 there is posterior lumbar interbody fusion and posterior decompression. No foraminal or central canal stenosis. Central canal is partially obscured by susceptibility artifact resulting from the orthopedic hardware. 4. No acute osseous injury of the lumbar spine.  Electronically Signed   By: Elige Ko M.D.   On: 04/17/2023 07:52 Note: Reviewed        Physical Exam  General appearance: Well nourished, well developed, and well hydrated. In no apparent acute distress Mental status: Alert, oriented x 3 (person, place, & time)       Respiratory: No evidence of acute respiratory distress Eyes: PERLA Vitals: BP 110/60   Pulse 64   Temp (!) 97.2 F (36.2 C) (Temporal)   Resp 16   Ht 5\' 8"  (1.727 m)   Wt 150 lb (68 kg)   LMP  (LMP Unknown)   SpO2 100%   BMI 22.81 kg/m  BMI: Estimated body mass index is 22.81 kg/m as calculated from the following:   Height as of this encounter: 5\' 8"  (1.727 m).   Weight as of this encounter: 150 lb (68 kg). Ideal: Ideal body weight: 63.9 kg (140 lb 14 oz) Adjusted ideal body weight: 65.6 kg (144 lb 8.4 oz)  Lumbar Spine Area Exam  Skin & Axial Inspection: Well healed scar from previous spine surgery detected Alignment: Symmetrical Functional ROM: Pain restricted ROM       Stability: No instability detected Muscle Tone/Strength: Functionally intact. No obvious neuro-muscular anomalies detected. Sensory (Neurological): Radicular component   Gait & Posture Assessment  Ambulation: Unassisted Gait: Relatively normal for age and body habitus Posture: WNL  Lower Extremity Exam      Side: Right lower extremity   Side: Left lower extremity   Stability: No instability observed           Stability: No instability observed          Skin & Extremity Inspection: Skin color, temperature, and hair growth are WNL. No peripheral edema or cyanosis. No masses, redness, swelling, asymmetry, or associated skin lesions. No contractures.   Skin & Extremity Inspection: Skin color, temperature, and hair growth are WNL. No peripheral edema or cyanosis. No masses, redness, swelling, asymmetry, or associated skin lesions. No contractures.  Functional ROM: Pain restricted ROM for hip and knee joints pain with hip abduction           Functional ROM: Pain restricted ROM for hip and knee joints pain with hip abduction          Muscle Tone/Strength: Functionally intact. No obvious neuro-muscular anomalies detected.   Muscle Tone/Strength: Functionally intact. No obvious neuro-muscular anomalies detected.  Sensory (Neurological): Musculoskeletal pain pattern         Sensory (Neurological): Musculoskeletal pain pattern        DTR: Patellar: deferred today Achilles: deferred today Plantar: deferred today   DTR: Patellar: deferred today Achilles: deferred today Plantar: deferred today  Palpation: No palpable anomalies   Palpation: No palpable anomalies    Assessment   Diagnosis Status  1. Chronic pain syndrome   2. History of lumbar fusion   3. Chronic radicular lumbar pain    Controlled  Controlled Having a Flare-up     Plan of Care  I reviewed lumbar MRI with [place].  She has a L4-L5 interbody fusion and has adjacent segment disease at L3-L4 as well as L2-L3.  At L2-L3 there is a broad-based disc bulge with ventral thecal sac flattening with associated moderate spinal canal stenosis, bilateral subarticular recess stenosis and moderate bilateral facet arthropathy.  We discussed a lumbar epidural steroid injection as well as spinal cord stimulation.  Will start with a lumbar epidural steroid injection at L2-L3.  Also recommend that she start  gabapentin as below.  Unfortunately her Lyrica was denied for an unclear reason by insurance.  She has tried Cymbalta in the past at 60 mg daily which was not helpful.  I encouraged her to continue with lumbar stretching exercises.   Pharmacotherapy (Medications Ordered): Meds ordered this encounter  Medications   gabapentin (NEURONTIN) 300 MG capsule    Sig: Take 1 capsule (300 mg total) by mouth every 8 (eight) hours.    Dispense:  90 capsule    Refill:  1    Fill one day early if pharmacy is closed on scheduled refill date. May substitute for generic if available.   Orders:  Orders Placed This Encounter  Procedures   Lumbar Epidural Injection    Standing Status:   Future    Standing Expiration Date:   07/22/2023    Scheduling Instructions:     Procedure: Interlaminar Lumbar Epidural Steroid injection (LESI)            Laterality: L2/3     Sedation: without     Timeframe: ASAA    Order Specific Question:   Where will this procedure be performed?    Answer:   [facility]   Follow-up plan:   Return in about 8 days (around 04/29/2023) for L2/3 ESI, in clinic NS (stop Xarelto 3 days prior).     Recent Visits Date Type Provider Dept  03/31/23 Office Visit Edward Jolly, MD [facility]  Showing recent visits within past 90 days and meeting all other requirements Today's Visits Date Type Provider Dept  04/21/23 Office Visit Edward Jolly, MD [facility]  Showing today's visits and meeting all other requirements Future Appointments No visits were found meeting these conditions. Showing future appointments within next 90 days and meeting all other requirements  I discussed the assessment and treatment plan with the patient. The patient was provided an opportunity to ask questions and all were answered. The patient agreed with the plan and demonstrated an understanding of the instructions.  Patient advised to call back or seek an in-person evaluation if  the symptoms or condition worsens.  Duration of encounter: .  Total time on encounter, as per AMA guidelines included both the face-to-face and non-face-to-face time personally spent by the physician and/or other qualified health care professional(s) on the day of the encounter (includes time in activities that require the physician or other qualified health care professional and does not include time in activities normally performed by clinical staff). Physician's time may include the following activities when performed: Preparing to see the patient (e.g., pre-charting review of records, searching for previously ordered imaging, lab work, and nerve conduction tests) Review of prior analgesic pharmacotherapies. Reviewing PMP Interpreting ordered tests (e.g., lab work, imaging, nerve conduction tests) Performing post-procedure evaluations, including interpretation of diagnostic procedures Obtaining and/or reviewing separately obtained history Performing a medically appropriate examination and/or evaluation Counseling and educating the patient/family/caregiver Ordering medications, tests,  or procedures Referring and communicating with other health care professionals (when not separately reported) Documenting clinical information in the electronic or other health record Independently interpreting results (not separately reported) and communicating results to the patient/ family/caregiver Care coordination (not separately reported)  Note by: Edward Jolly, MD Date: 04/21/2023; Time: 9:49 AM

## 2023-04-21 NOTE — Patient Instructions (Addendum)
Start advair HFA inhaler 2 puffs twice daily - rinse mouth out after each use  We will repeat inflammatory labs today  There is concern for possible early bronchiectasis based on your CT Chest scan which we will monitor  Start ipratropium nasal spray, 2 sprays per nostril twice daily  Follow up in 4 months with pulmonary function tests

## 2023-04-21 NOTE — Progress Notes (Signed)
Synopsis: Referred in July 2024 for chronic cough  Subjective:   PATIENT ID: Danielle Rocha GENDER: female DOB: Dec 15, 1950, MRN: [id number]   HPI  Chief Complaint  Patient presents with   Consult    Referred by PCP for chronic cough for the past few months. Unable to produce any phlegm.    Danielle Rocha is a 72 year old woman, never smoker with history of atrial fibrillation who is referred to pulmonary clinic for chronic cough.  She reports having a dry cough for over a year.  The cough was worse in the springtime when she had a mild COVID infection.  She does report postnasal drainage.  She is currently on PPI therapy and denies any reflux symptoms.  She reports nighttime awakenings 1-2 times per week due to cough.  At times she will have a hard time falling asleep due to the cough.  She reports progressive shortness of breath over the last couple of months.  She denies any wheezing.  She has some dry eyes and dry mouth though this is better now as it was worse in the past.  She reports bilateral hip pain and lower back pain and is scheduled for an epidural next week.  She has positive ANA and elevated SSA antibody levels from 2021.  She is a never smoker.  She reports secondhand smoke exposure in childhood.  Her mother had COPD and chronic respiratory failure.  Her father had lung cancer.  Past Medical History:  Diagnosis Date   Anemia    Arthritis    DJD, low back, thumb   Atrial fibrillation (HCC)    Xarelto anticoagulation. Flecainide antiarrythmic    Chicken pox    Chronic pain syndrome    On disability. History of bilateral hip pain, low back pain. Gabapentin 400 BID, percocet 1 tablet daily per prior provider.    Colon polyp    awaiting records   Depression    zoloft 100mg , remeron 30mg  per psychiatry. ambien 10mg  per psychiatry to help wtih sleep element.    Dysrhythmia    Dystonia    described as psychogenic dystonia. Pain and twisting from upper chest and up with  triggers "wind, creamy food" on TID ativan per psychiatry previously.    Fibromyalgia    GERD (gastroesophageal reflux disease)    omeprazole OTC   Goiter    states multiple imaging tests, has had biopsies   Hyperlipidemia    lovastatin 20mg    Stroke (HCC)    TIA (left side of face and body decreased sensitivity than right face and side)   TIA (transient ischemic attack)    Vaginal atrophy    estrace vaginal cream     Family History  Problem Relation Age of Onset   Alcohol abuse Mother        possible she had stomach cancer as well but unsure   Hypertension Father    Alcohol abuse Father    Pancreatic cancer Father    Cancer Brother        spindle cell RLE   Diabetes Brother    Cancer Brother        tonsil cancer   Skin cancer Brother    Colon cancer Neg Hx    Colon polyps Neg Hx    Stomach cancer Neg Hx      Social History   Socioeconomic History   Marital status: Married    Spouse name: Not on file   Number of children: 3   Years of education:  13   Highest education level: Associate degree: occupational, Scientist, product/process development, or vocational program  Occupational History   Occupation: retired  Tobacco Use   Smoking status: Never   Smokeless tobacco: Never  Vaping Use   Vaping status: Never Used  Substance and Sexual Activity   Alcohol use: No    Alcohol/week: 0.0 standard drinks of alcohol   Drug use: No   Sexual activity: Not Currently    Partners: Male  Other Topics Concern   Not on file  Social History Narrative   Family: Married. Husband works as Hospital doctor for [employer] center. 2 children from previous marriage 1 from current. Son Jeannett Senior lives with them and has down's syndrome.       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 Moved to [facility] from [phone] and became disabled in that time frame due to hip/back issues.       Hobbies: time with son   Right handed   One story home   Caffeine occasionally   Social Determinants of Health    Financial Resource Strain: Low Risk  (01/01/2023)   Overall Financial Resource Strain (CARDIA)    Difficulty of Paying Living Expenses: Not hard at all  Food Insecurity: No Food Insecurity (01/01/2023)   Hunger Vital Sign    Worried About Running Out of Food in the Last Year: Never true    Ran Out of Food in the Last Year: Never true  Transportation Needs: No Transportation Needs (01/01/2023)   PRAPARE - Administrator, Civil Service (Medical): No    Lack of Transportation (Non-Medical): No  Physical Activity: Insufficiently Active (01/01/2023)   Exercise Vital Sign    Days of Exercise per Week: 3 days    Minutes of Exercise per Session: 20 min  Stress: No Stress Concern Present (01/01/2023)   [employer] of Occupational Health - Occupational Stress Questionnaire    Feeling of Stress : Only a little  Social Connections: Socially Integrated (01/01/2023)   Social Connection and Isolation Panel [NHANES]    Frequency of Communication with Friends and Family: More than three times a week    Frequency of Social Gatherings with Friends and Family: More than three times a week    Attends Religious Services: More than 4 times per year    Active Member of [employer] or Organizations: Yes    Attends Engineer, structural: More than 4 times per year    Marital Status: Married  Catering manager Violence: Not At Risk (09/29/2022)   Humiliation, Afraid, Rape, and Kick questionnaire    Fear of Current or Ex-Partner: No    Emotionally Abused: No    Physically Abused: No    Sexually Abused: No     Allergies  Allergen Reactions   Reglan [Metoclopramide] Other (See Comments)    Suicidal thoughts   Ciprofloxacin Diarrhea   Clindamycin/Lincomycin Diarrhea   Doxycycline Diarrhea and Nausea And Vomiting   Macrobid [Nitrofurantoin Monohyd Macro] Rash    Rash on lip   Sulfa Antibiotics Rash     Outpatient Medications Prior to Visit  Medication Sig Dispense Refill   acetaminophen  (TYLENOL) 500 MG tablet Take 1,000 mg by mouth every 6 (six) hours as needed for mild pain, moderate pain, fever or headache.     buPROPion (WELLBUTRIN XL) 300 MG 24 hr tablet Take 300 mg by mouth daily. Through psychiatry     cephALEXin (KEFLEX) 250 MG capsule Take 250 mg by mouth daily.  Cholecalciferol (VITAMIN D) 125 MCG (5000 UT) CAPS Take 5,000 Units by mouth daily.     Coenzyme Q10 200 MG TABS Take 200 mg by mouth daily.     Cyanocobalamin (B-12 PO) Take 1 tablet by mouth daily.     Erenumab-aooe (AIMOVIG) 140 MG/ML SOAJ Inject 140 mg into the skin every 28 (twenty-eight) days. 1.12 mL 11   flecainide (TAMBOCOR) 100 MG tablet Take 1 tablet by mouth twice daily 180 tablet 0   fluticasone (FLONASE) 50 MCG/ACT nasal spray Place 2 sprays into both nostrils daily. 16 g 3   gabapentin (NEURONTIN) 300 MG capsule Take 1 capsule (300 mg total) by mouth every 8 (eight) hours. 90 capsule 1   LORazepam (ATIVAN) 0.5 MG tablet Take 1 tablet (0.5 mg total) by mouth daily as needed for anxiety (and dystonia). 10 tablet 0   Magnesium Oxide 420 MG TABS Take 420 mg by mouth daily.     melatonin 5 MG TABS Take 5 mg by mouth at bedtime.     Multiple Vitamin (MULTIVITAMIN WITH MINERALS) TABS tablet Take 1 tablet by mouth daily.     ondansetron (ZOFRAN ODT) 4 MG disintegrating tablet Take 1 tablet (4 mg total) by mouth every 8 (eight) hours as needed for nausea or vomiting. 20 tablet 0   pantoprazole (PROTONIX) 20 MG tablet Take 1 tablet (20 mg total) by mouth 2 (two) times daily before a meal. 180 tablet 3   pyridOXINE (VITAMIN B6) 100 MG tablet Take 100 mg by mouth daily.     rosuvastatin (CRESTOR) 20 MG tablet Take 1 tablet by mouth once daily 90 tablet 0   sertraline (ZOLOFT) 50 MG tablet Take 50 mg by mouth in the morning and at bedtime. Through psychiatry     SUMAtriptan (IMITREX) 100 MG tablet TAKE ONE TABLET BY MOUTH AT ONSET OF MIGRAINE FOR UP TO 1 DOSE 10 tablet 5   traZODone (DESYREL) 50 MG tablet  TAKE 1 TABLET BY MOUTH AT BEDTIME AS NEEDED FOR SLEEP 30 tablet 0   XARELTO 20 MG TABS tablet TAKE 1 TABLET BY MOUTH ONCE DAILY WITH SUPPER 90 tablet 0   No facility-administered medications prior to visit.    Review of Systems  Constitutional:  Negative for chills, fever, malaise/fatigue and weight loss.  HENT:  Negative for congestion, sinus pain and sore throat.   Eyes: Negative.   Respiratory:  Positive for cough and shortness of breath. Negative for hemoptysis, sputum production and wheezing.   Cardiovascular:  Negative for chest pain, palpitations, orthopnea, claudication and leg swelling.  Gastrointestinal:  Negative for abdominal pain, heartburn, nausea and vomiting.  Genitourinary: Negative.   Musculoskeletal:  Positive for joint pain. Negative for myalgias.  Skin:  Negative for rash.  Neurological:  Negative for weakness.  Endo/Heme/Allergies: Negative.   Psychiatric/Behavioral: Negative.        Objective:   Vitals:   04/21/23 1333  BP: 116/68  Pulse: 68  SpO2: 98%  Weight: 154 lb (69.9 kg)  Height: 5\' 8"  (1.727 m)     Physical Exam Constitutional:      General: She is not in acute distress.    Appearance: She is not ill-appearing.  HENT:     Head: Normocephalic and atraumatic.  Eyes:     General: No scleral icterus.    Conjunctiva/sclera: Conjunctivae normal.  Cardiovascular:     Rate and Rhythm: Normal rate and regular rhythm.     Pulses: Normal pulses.     Heart sounds:  Normal heart sounds. No murmur heard. Pulmonary:     Effort: Pulmonary effort is normal.     Breath sounds: Normal breath sounds. No wheezing, rhonchi or rales.  Musculoskeletal:     Right lower leg: No edema.     Left lower leg: No edema.  Skin:    General: Skin is warm and dry.  Neurological:     General: No focal deficit present.     Mental Status: She is alert.     CBC    Component Value Date/Time   WBC 8.3 11/25/2022 0943   RBC 4.99 11/25/2022 0943   HGB 14.2  11/25/2022 0943   HCT 42.4 11/25/2022 0943   PLT 244.0 11/25/2022 0943   MCV 84.8 11/25/2022 0943   MCH 29.2 05/19/2022 1400   MCHC 33.6 11/25/2022 0943   RDW 15.1 11/25/2022 0943   LYMPHSABS 2.1 11/25/2022 0943   MONOABS 0.8 11/25/2022 0943   EOSABS 0.1 11/25/2022 0943   BASOSABS 0.0 11/25/2022 0943     Chest imaging: CT Chest 01/31/23 Mediastinum/Nodes: No mediastinal lymphadenopathy. No evidence for gross hilar lymphadenopathy although assessment is limited by the lack of intravenous contrast on the current study. The esophagus has normal imaging features. There is no axillary lymphadenopathy. 15 mm low-density nodule identified in the thyroid isthmus stable since study from more than 7 years ago. Stability for greater than 5 years implies benignity; no biopsy or followup indicated as this lesion was previously biopsied in 2022. (Ref: J Am Coll Radiol. 2015 Feb;12(2): 143-50).   Lungs/Pleura: Trace biapical pleuroparenchymal scarring. No evidence for pneumothorax. No focal airspace consolidation. No pleural effusion. No suspicious pulmonary nodule or mass.  PFT:     No data to display          Labs:  Path:  Echo:  Heart Catheterization:       Assessment & Plan:   Chronic cough - Plan: Pulmonary Function Test, fluticasone-salmeterol (ADVAIR HFA) 115-21 MCG/ACT inhaler, ipratropium (ATROVENT) 0.03 % nasal spray  Gastroesophageal reflux disease without esophagitis  Arthritis - Plan: ANA, Rheumatoid factor, Anti-Smith antibody, Cyclic citrul peptide antibody, IgG, Sedimentation rate, Sjogren's syndrome antibods(ssa + ssb), Sjogren's syndrome antibods(ssa + ssb), Sedimentation rate, Cyclic citrul peptide antibody, IgG, Anti-Smith antibody, Rheumatoid factor, ANA  Discussion: Danielle Rocha is a 72 year old woman, never smoker with history of atrial fibrillation who is referred to pulmonary clinic for chronic cough.  The differential for her cough includes  reactive airways disease versus postnasal drip versus early bronchiectasis.  Personal review of her CT chest scan shows possibly early findings of bronchiectasis as there are some airways concerning for possible enlargement.  She has history of ANA positive testing along with elevated SSA antibodies.  We will repeat inflammatory markers today given her diffuse arthralgias and dry eyes/dry mouth.  She is to start Advair HFA inhaler 2 puffs twice daily and ipratropium nasal spray 2 sprays per nostril twice daily for postnasal drip.  Follow-up in 4 months with pulmonary function test.  Melody Comas, MD [facility] Office: [phone]   Current Outpatient Medications:    acetaminophen (TYLENOL) 500 MG tablet, Take 1,000 mg by mouth every 6 (six) hours as needed for mild pain, moderate pain, fever or headache., Disp: , Rfl:    buPROPion (WELLBUTRIN XL) 300 MG 24 hr tablet, Take 300 mg by mouth daily. Through psychiatry, Disp: , Rfl:    cephALEXin (KEFLEX) 250 MG capsule, Take 250 mg by mouth daily., Disp: , Rfl:  Cholecalciferol (VITAMIN D) 125 MCG (5000 UT) CAPS, Take 5,000 Units by mouth daily., Disp: , Rfl:    Coenzyme Q10 200 MG TABS, Take 200 mg by mouth daily., Disp: , Rfl:    Cyanocobalamin (B-12 PO), Take 1 tablet by mouth daily., Disp: , Rfl:    Erenumab-aooe (AIMOVIG) 140 MG/ML SOAJ, Inject 140 mg into the skin every 28 (twenty-eight) days., Disp: 1.12 mL, Rfl: 11   flecainide (TAMBOCOR) 100 MG tablet, Take 1 tablet by mouth twice daily, Disp: 180 tablet, Rfl: 0   fluticasone (FLONASE) 50 MCG/ACT nasal spray, Place 2 sprays into both nostrils daily., Disp: 16 g, Rfl: 3   fluticasone-salmeterol (ADVAIR HFA) 115-21 MCG/ACT inhaler, Inhale 2 puffs into the lungs 2 (two) times daily., Disp: 1 each, Rfl: 12   gabapentin (NEURONTIN) 300 MG capsule, Take 1 capsule (300 mg total) by mouth every 8 (eight) hours., Disp: 90 capsule, Rfl: 1   ipratropium (ATROVENT) 0.03  % nasal spray, Place 2 sprays into both nostrils every 12 (twelve) hours., Disp: 30 mL, Rfl: 12   LORazepam (ATIVAN) 0.5 MG tablet, Take 1 tablet (0.5 mg total) by mouth daily as needed for anxiety (and dystonia)., Disp: 10 tablet, Rfl: 0   Magnesium Oxide 420 MG TABS, Take 420 mg by mouth daily., Disp: , Rfl:    melatonin 5 MG TABS, Take 5 mg by mouth at bedtime., Disp: , Rfl:    Multiple Vitamin (MULTIVITAMIN WITH MINERALS) TABS tablet, Take 1 tablet by mouth daily., Disp: , Rfl:    ondansetron (ZOFRAN ODT) 4 MG disintegrating tablet, Take 1 tablet (4 mg total) by mouth every 8 (eight) hours as needed for nausea or vomiting., Disp: 20 tablet, Rfl: 0   pantoprazole (PROTONIX) 20 MG tablet, Take 1 tablet (20 mg total) by mouth 2 (two) times daily before a meal., Disp: 180 tablet, Rfl: 3   pyridOXINE (VITAMIN B6) 100 MG tablet, Take 100 mg by mouth daily., Disp: , Rfl:    rosuvastatin (CRESTOR) 20 MG tablet, Take 1 tablet by mouth once daily, Disp: 90 tablet, Rfl: 0   sertraline (ZOLOFT) 50 MG tablet, Take 50 mg by mouth in the morning and at bedtime. Through psychiatry, Disp: , Rfl:    SUMAtriptan (IMITREX) 100 MG tablet, TAKE ONE TABLET BY MOUTH AT ONSET OF MIGRAINE FOR UP TO 1 DOSE, Disp: 10 tablet, Rfl: 5   traZODone (DESYREL) 50 MG tablet, TAKE 1 TABLET BY MOUTH AT BEDTIME AS NEEDED FOR SLEEP, Disp: 30 tablet, Rfl: 0   XARELTO 20 MG TABS tablet, TAKE 1 TABLET BY MOUTH ONCE DAILY WITH SUPPER, Disp: 90 tablet, Rfl: 0

## 2023-04-23 ENCOUNTER — Other Ambulatory Visit (HOSPITAL_COMMUNITY): Payer: Self-pay

## 2023-04-23 ENCOUNTER — Encounter: Payer: Medicare HMO | Admitting: Rehabilitative and Restorative Service Providers"

## 2023-04-27 ENCOUNTER — Encounter: Payer: Self-pay | Admitting: Pulmonary Disease

## 2023-04-27 ENCOUNTER — Telehealth: Payer: Self-pay | Admitting: Internal Medicine

## 2023-04-27 ENCOUNTER — Telehealth: Payer: Self-pay

## 2023-04-27 NOTE — Telephone Encounter (Signed)
Called pt in regards to concern.  Pt reports has an OV scheduled with PCP.  Advised pt concern should be addressed by PCP.  Pt is agreeable reports only contacted our office d/t Xarelto.  Advised pt PCP f/u is appropriate. No further concerns at this time.

## 2023-04-27 NOTE — Telephone Encounter (Signed)
Pt is requesting a callback regarding her getting bruises on her back from scratching due to her having dry skin. Please advise

## 2023-04-27 NOTE — Telephone Encounter (Signed)
Her insurance is stating that the notes do not show symptoms related to problem in spine including pain that travels to the left in a pattern related to the level of the spine to be treated, pain or weakness that is worse with standing or walking and is better with rest or bending forward. An active PT program.  She went to one PT appt and cancelled all the rest.  Do you want to do a P2P?

## 2023-04-29 ENCOUNTER — Other Ambulatory Visit: Payer: Self-pay | Admitting: Pulmonary Disease

## 2023-04-29 DIAGNOSIS — R053 Chronic cough: Secondary | ICD-10-CM

## 2023-04-30 ENCOUNTER — Telehealth: Payer: Self-pay | Admitting: Pulmonary Disease

## 2023-04-30 ENCOUNTER — Encounter: Payer: Medicare HMO | Admitting: Rehabilitative and Restorative Service Providers"

## 2023-04-30 DIAGNOSIS — M3502 Sicca syndrome with lung involvement: Secondary | ICD-10-CM

## 2023-04-30 NOTE — Telephone Encounter (Signed)
I have notified patient via myChart message about her positive ANA and SSA testing which correlates with her clinical history of dry eyes and dry mouth consistent with Sjogren's syndrome. I have recommend referral to rheumatology, order placed.  Melody Comas, MD [facility] Office: [phone]   See Amion for personal pager PCCM on call pager [phone] until 7pm. Please call Elink 7p-7a. [phone]

## 2023-04-30 NOTE — Telephone Encounter (Signed)
Breyna sent to her pharmacy

## 2023-04-30 NOTE — Telephone Encounter (Signed)
ins will cover breo 100/25, breyna,..... flutic/salme 250/50.

## 2023-05-03 ENCOUNTER — Other Ambulatory Visit: Payer: Self-pay | Admitting: Family Medicine

## 2023-05-04 ENCOUNTER — Ambulatory Visit: Payer: Medicare HMO | Admitting: Family Medicine

## 2023-05-07 ENCOUNTER — Encounter (INDEPENDENT_AMBULATORY_CARE_PROVIDER_SITE_OTHER): Payer: Self-pay

## 2023-05-07 ENCOUNTER — Encounter: Payer: Self-pay | Admitting: Family Medicine

## 2023-05-07 ENCOUNTER — Ambulatory Visit: Payer: Medicare HMO | Admitting: Family Medicine

## 2023-05-07 ENCOUNTER — Encounter: Payer: Medicare HMO | Admitting: Rehabilitative and Restorative Service Providers"

## 2023-05-07 VITALS — BP 110/70 | HR 65 | Temp 98.4°F | Ht 68.0 in | Wt 155.4 lb

## 2023-05-07 DIAGNOSIS — M3500 Sicca syndrome, unspecified: Secondary | ICD-10-CM | POA: Diagnosis not present

## 2023-05-07 MED ORDER — FLUTICASONE PROPIONATE 50 MCG/ACT NA SUSP: 2.0000 | Freq: Every day | NASAL | 3 refills | Status: DC

## 2023-05-07 NOTE — Patient Instructions (Addendum)
Let us know if you get your flu or COVID vaccine this fall.  Consider systane artificial tears  Recommended follow up: Return for next already scheduled visit or sooner if needed.

## 2023-05-07 NOTE — Progress Notes (Signed)
Phone [phone] In person visit   Subjective:   Danielle Rocha is a 72 y.o. year old very pleasant female patient who presents for/with See problem oriented charting Chief Complaint  Patient presents with   Sjgren's syndrome    Pt states she has been recently diagnosed and has appt w/rheum in late Jan   Past Medical History-  Patient Active Problem List   Diagnosis Date Noted   Sjogren syndrome, unspecified (HCC) 05/07/2023    Priority: High   Dystonia     Priority: High   Major depression in full remission (HCC) 09/02/2016    Priority: High   History of SI/intentional drug overdose 11/22/2015    Priority: High   Atrial fibrillation (HCC)     Priority: High   Chronic pain syndrome     Priority: High   Seasonal allergic rhinitis due to pollen 01/01/2023    Priority: Medium    Aortic atherosclerosis (HCC) 02/07/2020    Priority: Medium    Delayed gastric emptying 02/07/2020    Priority: Medium    Restless legs 07/27/2018    Priority: Medium    Recurrent UTI 10/07/2016    Priority: Medium    Hyperlipidemia     Priority: Medium    GERD (gastroesophageal reflux disease) 01/30/2016    Priority: Low   History of lumbar fusion 03/31/2023   Chronic radicular lumbar pain 03/31/2023   Sacroiliac joint pain 03/31/2023   Tear of right gluteus medius tendon    Involuntary movements 02/14/2020    Medications- reviewed and updated Current Outpatient Medications  Medication Sig Dispense Refill   acetaminophen (TYLENOL) 500 MG tablet Take 1,000 mg by mouth every 6 (six) hours as needed for mild pain, moderate pain, fever or headache.     budesonide-formoterol (BREYNA) 160-4.5 MCG/ACT inhaler Inhale 2 puffs into the lungs 2 (two) times daily. 1 each 12   buPROPion (WELLBUTRIN XL) 300 MG 24 hr tablet Take 300 mg by mouth daily. Through psychiatry     cephALEXin (KEFLEX) 250 MG capsule Take 250 mg by mouth daily.     Cholecalciferol (VITAMIN D) 125 MCG (5000 UT) CAPS Take  5,000 Units by mouth daily.     Coenzyme Q10 200 MG TABS Take 200 mg by mouth daily.     Cyanocobalamin (B-12 PO) Take 1 tablet by mouth daily.     Erenumab-aooe (AIMOVIG) 140 MG/ML SOAJ Inject 140 mg into the skin every 28 (twenty-eight) days. 1.12 mL 11   flecainide (TAMBOCOR) 100 MG tablet Take 1 tablet by mouth twice daily 180 tablet 0   gabapentin (NEURONTIN) 300 MG capsule Take 1 capsule (300 mg total) by mouth every 8 (eight) hours. 90 capsule 1   ipratropium (ATROVENT) 0.03 % nasal spray Place 2 sprays into both nostrils every 12 (twelve) hours. 30 mL 12   Magnesium Oxide 420 MG TABS Take 420 mg by mouth daily.     melatonin 5 MG TABS Take 5 mg by mouth at bedtime.     Multiple Vitamin (MULTIVITAMIN WITH MINERALS) TABS tablet Take 1 tablet by mouth daily.     pantoprazole (PROTONIX) 20 MG tablet Take 1 tablet (20 mg total) by mouth 2 (two) times daily before a meal. 180 tablet 3   pyridOXINE (VITAMIN B6) 100 MG tablet Take 100 mg by mouth daily.     rosuvastatin (CRESTOR) 20 MG tablet Take 1 tablet by mouth once daily 90 tablet 0   sertraline (ZOLOFT) 50 MG tablet Take 50 mg by mouth  in the morning and at bedtime. Through psychiatry     SUMAtriptan (IMITREX) 100 MG tablet TAKE ONE TABLET BY MOUTH AT ONSET OF MIGRAINE FOR UP TO 1 DOSE 10 tablet 5   traZODone (DESYREL) 50 MG tablet TAKE 1 TABLET BY MOUTH AT BEDTIME AS NEEDED FOR SLEEP 30 tablet 0   XARELTO 20 MG TABS tablet TAKE 1 TABLET BY MOUTH ONCE DAILY WITH SUPPER 90 tablet 0   fluticasone (FLONASE) 50 MCG/ACT nasal spray Place 2 sprays into both nostrils daily. 16 g 3   LORazepam (ATIVAN) 0.5 MG tablet Take 1 tablet (0.5 mg total) by mouth daily as needed for anxiety (and dystonia). (Patient not taking: Reported on 05/07/2023) 10 tablet 0   ondansetron (ZOFRAN ODT) 4 MG disintegrating tablet Take 1 tablet (4 mg total) by mouth every 8 (eight) hours as needed for nausea or vomiting. (Patient not taking: Reported on 05/07/2023) 20 tablet 0    No current facility-administered medications for this visit.     Objective:  BP 110/70   Pulse 65   Temp 98.4 F (36.9 C)   Ht 5\' 8"  (1.727 m)   Wt 155 lb 6.4 oz (70.5 kg)   LMP  (LMP Unknown)   SpO2 96%   BMI 23.63 kg/m  Gen: NAD, resting comfortably CV: RRR no murmurs rubs or gallops Lungs: CTAB no crackles, wheeze, rhonchi Ext: no edema Skin: warm, dry, small scab on philtrum - reports had fever blister there about a month ago -slow to heal    Assessment and Plan    # Sjogren syndrome S: Patient reports she was recently diagnosed bypulmonology after discussing dry eyes and dry mouth and being found to have positive ANA and SSA antibody.  She has an upcoming visit with rheumatology but not able to be seen until January -she is already using eye drops- sometimes bausch and lomb causes burning- we discussed a new brand -for dry mouth tries to drink water through the day. Gets mouth ulcers at times. Reports has had fair amount of cavities -had been on an injection years ago through rheumatology- she had known ANA then but was never told speicifically sjogrens- we did look back to 2021 and had positive SSA then with neurology- they mentioned forwarding results but not noted in commments that it was forwarded -rare use of benadryl- advised against A/P:  new diagnosis. Has upcoming rheumatology visit.  We counseled on conservative measures (reviewed [email or web address] conservative management options)- she seems to do ok with these- hoping she does not need systemic medication. She is going to try new brand of artificial tears- systane  #Injection planned on 19th- reports with hip and low back really bothering her when she stands up- better with movement- hoping injections helps  Recommended follow up: Return for next already scheduled visit or sooner if needed. Future Appointments  Date Time Provider Department Center  05/11/2023 11:40 AM Edward Jolly, MD [facility] None  06/16/2023   9:00 AM Shelva Majestic, MD [facility]  08/13/2023  9:00 AM [facility] None  08/14/2023  9:00 AM Marcos Eke, PA-C [facility] None  10/14/2023  8:40 AM Rice, Jamesetta Orleans, MD [facility] None    Lab/Order associations:   ICD-10-CM   1. Sjogren syndrome, unspecified (HCC)  M35.00       Meds ordered this encounter  Medications   fluticasone (FLONASE) 50 MCG/ACT nasal spray    Sig: Place 2 sprays into both nostrils daily.    Dispense:  16  g    Refill:  3   Time Spent: 26 minutes of total time (8:40 AM- 9:06 AM) was spent on the date of the encounter performing the following actions: chart review prior to seeing the patient, obtaining history, performing a medically necessary exam, counseling on the treatment plan and reviewing conservative measures and potential triggers, placing orders, and documenting in our EHR.    Return precautions advised.  Tana Conch, MD

## 2023-05-11 ENCOUNTER — Encounter: Payer: Self-pay | Admitting: Student in an Organized Health Care Education/Training Program

## 2023-05-11 ENCOUNTER — Ambulatory Visit
Admission: RE | Admit: 2023-05-11 | Discharge: 2023-05-11 | Disposition: A | Discharge status: Home / Self Care | Payer: Medicare HMO | Source: Ambulatory Visit | Attending: Student in an Organized Health Care Education/Training Program | Admitting: Student in an Organized Health Care Education/Training Program

## 2023-05-11 ENCOUNTER — Ambulatory Visit
Payer: Medicare HMO | Attending: Student in an Organized Health Care Education/Training Program | Admitting: Student in an Organized Health Care Education/Training Program

## 2023-05-11 DIAGNOSIS — M5416 Radiculopathy, lumbar region: Secondary | ICD-10-CM | POA: Diagnosis not present

## 2023-05-11 DIAGNOSIS — Z981 Arthrodesis status: Secondary | ICD-10-CM | POA: Insufficient documentation

## 2023-05-11 DIAGNOSIS — G8929 Other chronic pain: Secondary | ICD-10-CM | POA: Diagnosis not present

## 2023-05-11 DIAGNOSIS — G894 Chronic pain syndrome: Secondary | ICD-10-CM | POA: Insufficient documentation

## 2023-05-11 MED ORDER — ROPIVACAINE HCL 2 MG/ML IJ SOLN
INTRAMUSCULAR | Status: AC
  Filled 2023-05-11: qty 20

## 2023-05-11 MED ORDER — IOHEXOL 180 MG/ML  SOLN
10.0000 mL | Freq: Once | INTRAMUSCULAR | Status: AC
  Administered 2023-05-11: 10 mL via EPIDURAL

## 2023-05-11 MED ORDER — SODIUM CHLORIDE (PF) 0.9 % IJ SOLN
INTRAMUSCULAR | Status: AC
  Filled 2023-05-11: qty 10

## 2023-05-11 MED ORDER — LIDOCAINE HCL 2 % IJ SOLN
20.0000 mL | Freq: Once | INTRAMUSCULAR | Status: AC
  Administered 2023-05-11: 400 mg

## 2023-05-11 MED ORDER — DIAZEPAM 5 MG PO TABS
ORAL_TABLET | ORAL | Status: AC
  Filled 2023-05-11: qty 1

## 2023-05-11 MED ORDER — ROPIVACAINE HCL 2 MG/ML IJ SOLN
2.0000 mL | Freq: Once | INTRAMUSCULAR | Status: AC
  Administered 2023-05-11: 2 mL via EPIDURAL

## 2023-05-11 MED ORDER — DEXAMETHASONE SODIUM PHOSPHATE 10 MG/ML IJ SOLN
10.0000 mg | Freq: Once | INTRAMUSCULAR | Status: AC
  Administered 2023-05-11: 10 mg

## 2023-05-11 MED ORDER — LIDOCAINE HCL 2 % IJ SOLN
INTRAMUSCULAR | Status: AC
  Filled 2023-05-11: qty 20

## 2023-05-11 MED ORDER — IOHEXOL 180 MG/ML  SOLN
INTRAMUSCULAR | Status: AC
  Filled 2023-05-11: qty 20

## 2023-05-11 MED ORDER — SODIUM CHLORIDE 0.9% FLUSH
2.0000 mL | Freq: Once | INTRAVENOUS | Status: AC
  Administered 2023-05-11: 2 mL

## 2023-05-11 MED ORDER — DEXAMETHASONE SODIUM PHOSPHATE 10 MG/ML IJ SOLN
INTRAMUSCULAR | Status: AC
  Filled 2023-05-11: qty 1

## 2023-05-11 MED ORDER — DIAZEPAM 5 MG PO TABS
5.0000 mg | ORAL_TABLET | ORAL | Status: AC
  Administered 2023-05-11: 5 mg via ORAL

## 2023-05-11 NOTE — Progress Notes (Signed)
Safety precautions to be maintained throughout the outpatient stay will include: orient to surroundings, keep bed in low position, maintain call bell within reach at all times, provide assistance with transfer out of bed and ambulation.  

## 2023-05-11 NOTE — Progress Notes (Signed)
PROVIDER NOTE: Interpretation of information contained herein should be left to medically-trained personnel. Specific patient instructions are provided elsewhere under "Patient Instructions" section of medical record. This document was created in part using STT-dictation technology, any transcriptional errors that may result from this process are unintentional.  Patient: Danielle Rocha Type: Established DOB: 05-14-1951 MRN: [id number] PCP: Shelva Majestic, MD  Service: Procedure DOS: 05/11/2023 Setting: Ambulatory Location: Ambulatory outpatient facility Delivery: Face-to-face Provider: Edward Jolly, MD Specialty: Interventional Pain Management Specialty designation: 09 Location: Outpatient facility Ref. Prov.: Edward Jolly, MD       Interventional Therapy   Procedure: Lumbar epidural steroid injection (LESI) (interlaminar) #1    Laterality: Midline   Level:  L2-3 Level.  Imaging: Fluoroscopic guidance         Anesthesia: Local anesthesia (1-2% Lidocaine) Anxiolysis: Valium 5 mg PO DOS: 05/11/2023  Performed by: Edward Jolly, MD  Purpose: Diagnostic/Therapeutic Indications: Lumbar radicular pain of intraspinal etiology of more than 4 weeks that has failed to respond to conservative therapy and is severe enough to impact quality of life or function. 1. Chronic pain syndrome   2. History of lumbar fusion   3. Chronic radicular lumbar pain    NAS-11 Pain score:   Pre-procedure: 4 /10   Post-procedure: 4 /10      Patient stopped Eliquis 3 days prior  Position / Prep / Materials:  Position: Prone w/ head of the table raised (slight reverse trendelenburg) to facilitate breathing.  Prep solution: DuraPrep (Iodine Povacrylex [0.7% available iodine] and Isopropyl Alcohol, 74% w/w) Prep Area: Entire Posterior Lumbar Region from lower scapular tip down to mid buttocks area and from flank to flank. Materials:  Tray: Epidural tray Needle(s):  Type: Epidural needle (Tuohy) Gauge (G):   22 Length: Regular (3.5-in) Qty: 1   H&P (Pre-op Assessment):  Ms. Danielle Rocha is a 72 y.o. (year old), female patient, seen today for interventional treatment. She  has a past surgical history that includes piriformis release (09/22/1997); fusion l4-l5 (09/22/1998); Tonsillectomy and adenoidectomy (age 72); Vaginal hysterectomy (09/23/1991); Other surgical history; Biopsy thyroid; Laminectomy; Atrial fibrillation ablation; and Gluteus minimus repair (Right, 05/19/2022). [name] has a current medication list which includes the following prescription(s): budesonide-formoterol, bupropion, cephalexin, vitamin d, cyanocobalamin, aimovig, flecainide, fluticasone, gabapentin, ipratropium, magnesium oxide, melatonin, multivitamin with minerals, pantoprazole, pyridoxine, sertraline, sumatriptan, trazodone, xarelto, acetaminophen, coenzyme q10, lorazepam, ondansetron, and rosuvastatin. Her primarily concern today is the Back Pain (Bilateral right is worse )  Initial Vital Signs:  Pulse/HCG Rate: 62  Temp: (!) 97.2 F (36.2 C) Resp: 16 BP: 105/62 SpO2: 97 %  BMI: Estimated body mass index is 22.48 kg/m as calculated from the following:   Height as of this encounter: 5' 8.5" (1.74 m).   Weight as of this encounter: 150 lb (68 kg).  Risk Assessment: Allergies: Reviewed. She is allergic to reglan [metoclopramide], ciprofloxacin, clindamycin/lincomycin, doxycycline, macrobid [nitrofurantoin monohyd macro], and sulfa antibiotics.  Allergy Precautions: None required Coagulopathies: Reviewed. None identified.  Blood-thinner therapy: None at this time Active Infection(s): Reviewed. None identified. Ms. Danielle Rocha is afebrile  Site Confirmation: Ms. Danielle Rocha was asked to confirm the procedure and laterality before marking the site Procedure checklist: Completed Consent: Before the procedure and under the influence of no sedative(s), amnesic(s), or anxiolytics, the patient was informed of the treatment options,  risks and possible complications. To fulfill our ethical and legal obligations, as recommended by the American Medical Association's Code of Ethics, I have informed the patient of my clinical impression; the nature  and purpose of the treatment or procedure; the risks, benefits, and possible complications of the intervention; the alternatives, including doing nothing; the risk(s) and benefit(s) of the alternative treatment(s) or procedure(s); and the risk(s) and benefit(s) of doing nothing. The patient was provided information about the general risks and possible complications associated with the procedure. These may include, but are not limited to: failure to achieve desired goals, infection, bleeding, organ or nerve damage, allergic reactions, paralysis, and death. In addition, the patient was informed of those risks and complications associated to Spine-related procedures, such as failure to decrease pain; infection (i.e.: Meningitis, epidural or intraspinal abscess); bleeding (i.e.: epidural hematoma, subarachnoid hemorrhage, or any other type of intraspinal or peri-dural bleeding); organ or nerve damage (i.e.: Any type of peripheral nerve, nerve root, or spinal cord injury) with subsequent damage to sensory, motor, and/or autonomic systems, resulting in permanent pain, numbness, and/or weakness of one or several areas of the body; allergic reactions; (i.e.: anaphylactic reaction); and/or death. Furthermore, the patient was informed of those risks and complications associated with the medications. These include, but are not limited to: allergic reactions (i.e.: anaphylactic or anaphylactoid reaction(s)); adrenal axis suppression; blood sugar elevation that in diabetics may result in ketoacidosis or comma; water retention that in patients with history of congestive heart failure may result in shortness of breath, pulmonary edema, and decompensation with resultant heart failure; weight gain; swelling or edema;  medication-induced neural toxicity; particulate matter embolism and blood vessel occlusion with resultant organ, and/or nervous system infarction; and/or aseptic necrosis of one or more joints. Finally, the patient was informed that Medicine is not an exact science; therefore, there is also the possibility of unforeseen or unpredictable risks and/or possible complications that may result in a catastrophic outcome. The patient indicated having understood very clearly. We have given the patient no guarantees and we have made no promises. Enough time was given to the patient to ask questions, all of which were answered to the patient's satisfaction. Ms. Burki has indicated that she wanted to continue with the procedure. Attestation: I, the ordering provider, attest that I have discussed with the patient the benefits, risks, side-effects, alternatives, likelihood of achieving goals, and potential problems during recovery for the procedure that I have provided informed consent. Date  Time: 05/11/2023  1:23 PM   Pre-Procedure Preparation:  Monitoring: As per clinic protocol. Respiration, ETCO2, SpO2, BP, heart rate and rhythm monitor placed and checked for adequate function Safety Precautions: Patient was assessed for positional comfort and pressure points before starting the procedure. Time-out: I initiated and conducted the "Time-out" before starting the procedure, as per protocol. The patient was asked to participate by confirming the accuracy of the "Time Out" information. Verification of the correct person, site, and procedure were performed and confirmed by me, the nursing staff, and the patient. "Time-out" conducted as per Joint Commission's Universal Protocol (UP.01.01.01). Time: 1350 Start Time: 1350 hrs.  Description/Narrative of Procedure:          Target: Epidural space via interlaminar opening, initially targeting the lower laminar border of the superior vertebral body. Region:  Lumbar Approach: Percutaneous paravertebral  Rationale (medical necessity): procedure needed and proper for the diagnosis and/or treatment of the patient's medical symptoms and needs. Procedural Technique Safety Precautions: Aspiration looking for blood return was conducted prior to all injections. At no point did we inject any substances, as a needle was being advanced. No attempts were made at seeking any paresthesias. Safe injection practices and needle disposal techniques used.  Medications properly checked for expiration dates. SDV (single dose vial) medications used. Description of the Procedure: Protocol guidelines were followed. The procedure needle was introduced through the skin, ipsilateral to the reported pain, and advanced to the target area. Bone was contacted and the needle walked caudad, until the lamina was cleared. The epidural space was identified using "loss-of-resistance technique" with 2-3 ml of PF-NaCl (0.9% NSS), in a 5cc LOR glass syringe.  Vitals:   05/11/23 1341 05/11/23 1350 05/11/23 1355  BP: 105/62 128/72 126/71  Pulse: 62 (!) 57 (!) 56  Resp: 16 13 15   Temp: (!) 97.2 F (36.2 C)    TempSrc: Temporal    SpO2: 97% 96% 97%  Weight: 150 lb (68 kg)    Height: 5' 8.5" (1.74 m)      Start Time: 1350 hrs. End Time: 1353 hrs.  Imaging Guidance (Spinal):          Type of Imaging Technique: Fluoroscopy Guidance (Spinal) Indication(s): Assistance in needle guidance and placement for procedures requiring needle placement in or near specific anatomical locations not easily accessible without such assistance. Exposure Time: Please see nurses notes. Contrast: Before injecting any contrast, we confirmed that the patient did not have an allergy to iodine, shellfish, or radiological contrast. Once satisfactory needle placement was completed at the desired level, radiological contrast was injected. Contrast injected under live fluoroscopy. No contrast complications. See chart for  type and volume of contrast used. Fluoroscopic Guidance: I was personally present during the use of fluoroscopy. "Tunnel Vision Technique" used to obtain the best possible view of the target area. Parallax error corrected before commencing the procedure. "Direction-depth-direction" technique used to introduce the needle under continuous pulsed fluoroscopy. Once target was reached, antero-posterior, oblique, and lateral fluoroscopic projection used confirm needle placement in all planes. Images permanently stored in EMR. Interpretation: I personally interpreted the imaging intraoperatively. Adequate needle placement confirmed in multiple planes. Appropriate spread of contrast into desired area was observed. No evidence of afferent or efferent intravascular uptake. No intrathecal or subarachnoid spread observed. Permanent images saved into the patient's record.  Antibiotic Prophylaxis:   Anti-infectives (From admission, onward)    None      Indication(s): None identified  Post-operative Assessment:  Post-procedure Vital Signs:  Pulse/HCG Rate: (!) 56  Temp: (!) 97.2 F (36.2 C) Resp: 15 BP: 126/71 SpO2: 97 %  EBL: None  Complications: No immediate post-treatment complications observed by team, or reported by patient.  Note: The patient tolerated the entire procedure well. A repeat set of vitals were taken after the procedure and the patient was kept under observation following institutional policy, for this type of procedure. Post-procedural neurological assessment was performed, showing return to baseline, prior to discharge. The patient was provided with post-procedure discharge instructions, including a section on how to identify potential problems. Should any problems arise concerning this procedure, the patient was given instructions to immediately contact us, at any time, without hesitation. In any case, we plan to contact the patient by telephone for a follow-up status report  regarding this interventional procedure.  Comments:  No additional relevant information.  Plan of Care (POC)  Orders:  Orders Placed This Encounter  Procedures   DG PAIN CLINIC C-ARM 1-60 MIN NO REPORT    Intraoperative interpretation by procedural physician at [facility].    Standing Status:   Standing    Number of Occurrences:   1    Order Specific Question:   Reason for exam:  Answer:   Assistance in needle guidance and placement for procedures requiring needle placement in or near specific anatomical locations not easily accessible without such assistance.    Medications ordered for procedure: Meds ordered this encounter  Medications   iohexol (OMNIPAQUE) 180 MG/ML injection 10 mL    Must be Myelogram-compatible. If not available, you may substitute with a water-soluble, non-ionic, hypoallergenic, myelogram-compatible radiological contrast medium.   lidocaine (XYLOCAINE) 2 % (with pres) injection 400 mg   diazepam (VALIUM) tablet 5 mg    Make sure Flumazenil is available in the pyxis when using this medication. If oversedation occurs, administer 0.2 mg IV over 15 sec. If after 45 sec no response, administer 0.2 mg again over 1 min; may repeat at 1 min intervals; not to exceed 4 doses (1 mg)   ropivacaine (PF) 2 mg/mL (0.2%) (NAROPIN) injection 2 mL   sodium chloride flush (NS) 0.9 % injection 2 mL   dexamethasone (DECADRON) injection 10 mg   Medications administered: We administered iohexol, lidocaine, diazepam, ropivacaine (PF) 2 mg/mL (0.2%), sodium chloride flush, and dexamethasone.  See the medical record for exact dosing, route, and time of administration.  Follow-up plan:   Return in about 4 weeks (around 06/08/2023) for Post Procedure Evaluation, in person.       Recent Visits Date Type Provider Dept  04/21/23 Office Visit Edward Jolly, MD [facility]  03/31/23 Office Visit Edward Jolly, MD [facility]  Showing recent visits  within past 90 days and meeting all other requirements Today's Visits Date Type Provider Dept  05/11/23 Procedure visit Edward Jolly, MD [facility]  Showing today's visits and meeting all other requirements Future Appointments Date Type Provider Dept  06/08/23 Appointment Edward Jolly, MD [facility]  Showing future appointments within next 90 days and meeting all other requirements  Disposition: Discharge home  Discharge (Date  Time): 05/11/2023; 1400 hrs.   Primary Care Physician: Shelva Majestic, MD Location: [facility] Note by: Edward Jolly, MD (TTS technology used. I apologize for any typographical errors that were not detected and corrected.) Date: 05/11/2023; Time: 2:18 PM  Disclaimer:  Medicine is not an Visual merchandiser. The only guarantee in medicine is that nothing is guaranteed. It is important to note that the decision to proceed with this intervention was based on the information collected from the patient. The Data and conclusions were drawn from the patient's questionnaire, the interview, and the physical examination. Because the information was provided in large part by the patient, it cannot be guaranteed that it has not been purposely or unconsciously manipulated. Every effort has been made to obtain as much relevant data as possible for this evaluation. It is important to note that the conclusions that lead to this procedure are derived in large part from the available data. Always take into account that the treatment will also be dependent on availability of resources and existing treatment guidelines, considered by other Pain Management Practitioners as being common knowledge and practice, at the time of the intervention. For Medico-Legal purposes, it is also important to point out that variation in procedural techniques and pharmacological choices are the acceptable norm. The indications, contraindications, technique, and  results of the above procedure should only be interpreted and judged by a Board-Certified Interventional Pain Specialist with extensive familiarity and expertise in the same exact procedure and technique.

## 2023-05-11 NOTE — Patient Instructions (Signed)

## 2023-05-12 ENCOUNTER — Telehealth: Payer: Self-pay

## 2023-05-12 NOTE — Telephone Encounter (Signed)
Post procedure follow up.  No answer.  

## 2023-05-19 ENCOUNTER — Ambulatory Visit: Payer: Medicare HMO | Admitting: Family Medicine

## 2023-05-24 ENCOUNTER — Other Ambulatory Visit: Payer: Self-pay | Admitting: Family Medicine

## 2023-06-08 ENCOUNTER — Ambulatory Visit: Payer: Medicare HMO | Admitting: Student in an Organized Health Care Education/Training Program

## 2023-06-08 ENCOUNTER — Encounter: Payer: Medicare HMO | Admitting: Pharmacist

## 2023-06-08 ENCOUNTER — Encounter: Payer: Self-pay | Admitting: Family Medicine

## 2023-06-08 ENCOUNTER — Ambulatory Visit (INDEPENDENT_AMBULATORY_CARE_PROVIDER_SITE_OTHER): Payer: Medicare HMO | Admitting: Family Medicine

## 2023-06-08 VITALS — BP 104/60 | HR 68 | Temp 97.9°F | Ht 68.5 in | Wt 153.8 lb

## 2023-06-08 DIAGNOSIS — G8929 Other chronic pain: Secondary | ICD-10-CM

## 2023-06-08 DIAGNOSIS — G894 Chronic pain syndrome: Secondary | ICD-10-CM

## 2023-06-08 DIAGNOSIS — M25572 Pain in left ankle and joints of left foot: Secondary | ICD-10-CM | POA: Diagnosis not present

## 2023-06-08 NOTE — Patient Instructions (Addendum)
Let us know when you get your flu vaccine.  We have placed a referral for you today to orthopedic- DrShon Baton or Dr. Steward Drone. In some cases you will see # listed below- you can call this if you have not heard within a week. If you do not see # listed- you should receive a mychart message or phone call within a week with the # to call directly- call that as soon as you get it. If you are having issues getting scheduled reach out to [place] again.   Recommended follow up: Return in about 4 months (around 10/08/2023) for physical or sooner if needed.Schedule b4 you leave.  -cancel visit on September 24th before you leave

## 2023-06-08 NOTE — Progress Notes (Signed)
Phone [phone] In person visit   Subjective:   Danielle Rocha is a 72 y.o. year old very pleasant female patient who presents for/with See problem oriented charting Chief Complaint  Patient presents with   left ankle pain    Pt c/o left ankle pain with no known injury that started a couple of months ago   Hip Pain   Tremors    Pt states she has hand tremors and thinks its coming from her inhaler.   Past Medical History-  Patient Active Problem List   Diagnosis Date Noted   Sjogren syndrome, unspecified (HCC) 05/07/2023    Priority: High   Dystonia     Priority: High   Major depression in full remission (HCC) 09/02/2016    Priority: High   History of SI/intentional drug overdose 11/22/2015    Priority: High   Atrial fibrillation (HCC)     Priority: High   Chronic pain syndrome     Priority: High   Seasonal allergic rhinitis due to pollen 01/01/2023    Priority: Medium    Aortic atherosclerosis (HCC) 02/07/2020    Priority: Medium    Delayed gastric emptying 02/07/2020    Priority: Medium    Restless legs 07/27/2018    Priority: Medium    Recurrent UTI 10/07/2016    Priority: Medium    Hyperlipidemia     Priority: Medium    GERD (gastroesophageal reflux disease) 01/30/2016    Priority: Low   History of lumbar fusion 03/31/2023   Chronic radicular lumbar pain 03/31/2023   Sacroiliac joint pain 03/31/2023   Tear of right gluteus medius tendon    Involuntary movements 02/14/2020    Medications- reviewed and updated Current Outpatient Medications  Medication Sig Dispense Refill   acetaminophen (TYLENOL) 500 MG tablet Take 1,000 mg by mouth every 6 (six) hours as needed for mild pain, moderate pain, fever or headache.     budesonide-formoterol (BREYNA) 160-4.5 MCG/ACT inhaler Inhale 2 puffs into the lungs 2 (two) times daily. 1 each 12   buPROPion (WELLBUTRIN XL) 300 MG 24 hr tablet Take 300 mg by mouth daily. Through psychiatry     cephALEXin (KEFLEX) 250 MG  capsule Take 250 mg by mouth daily.     Cholecalciferol (VITAMIN D) 125 MCG (5000 UT) CAPS Take 5,000 Units by mouth daily.     Coenzyme Q10 200 MG TABS Take 200 mg by mouth daily.     Cyanocobalamin (B-12 PO) Take 1 tablet by mouth daily.     Erenumab-aooe (AIMOVIG) 140 MG/ML SOAJ Inject 140 mg into the skin every 28 (twenty-eight) days. 1.12 mL 11   flecainide (TAMBOCOR) 100 MG tablet Take 1 tablet by mouth twice daily 180 tablet 0   fluticasone (FLONASE) 50 MCG/ACT nasal spray Place 2 sprays into both nostrils daily. 16 g 3   gabapentin (NEURONTIN) 300 MG capsule Take 1 capsule (300 mg total) by mouth every 8 (eight) hours. 90 capsule 1   ipratropium (ATROVENT) 0.03 % nasal spray Place 2 sprays into both nostrils every 12 (twelve) hours. 30 mL 12   Magnesium Oxide 420 MG TABS Take 420 mg by mouth daily.     melatonin 5 MG TABS Take 5 mg by mouth at bedtime.     Multiple Vitamin (MULTIVITAMIN WITH MINERALS) TABS tablet Take 1 tablet by mouth daily.     pantoprazole (PROTONIX) 20 MG tablet Take 1 tablet (20 mg total) by mouth 2 (two) times daily before a meal. (Patient taking differently: Take  20 mg by mouth daily.) 180 tablet 3   pyridOXINE (VITAMIN B6) 100 MG tablet Take 100 mg by mouth daily.     rosuvastatin (CRESTOR) 20 MG tablet Take 1 tablet by mouth once daily 90 tablet 0   sertraline (ZOLOFT) 50 MG tablet Take 50 mg by mouth in the morning and at bedtime. Through psychiatry     SUMAtriptan (IMITREX) 100 MG tablet TAKE ONE TABLET BY MOUTH AT ONSET OF MIGRAINE FOR UP TO 1 DOSE 10 tablet 5   traZODone (DESYREL) 50 MG tablet TAKE 1 TABLET BY MOUTH AT BEDTIME AS NEEDED FOR SLEEP 30 tablet 0   XARELTO 20 MG TABS tablet TAKE 1 TABLET BY MOUTH ONCE DAILY WITH SUPPER 90 tablet 0   LORazepam (ATIVAN) 0.5 MG tablet Take 1 tablet (0.5 mg total) by mouth daily as needed for anxiety (and dystonia). (Patient not taking: Reported on 06/08/2023) 10 tablet 0   ondansetron (ZOFRAN ODT) 4 MG disintegrating  tablet Take 1 tablet (4 mg total) by mouth every 8 (eight) hours as needed for nausea or vomiting. (Patient not taking: Reported on 06/08/2023) 20 tablet 0   No current facility-administered medications for this visit.     Objective:  BP 104/60   Pulse 68   Temp 97.9 F (36.6 C)   Ht 5' 8.5" (1.74 m)   Wt 153 lb 12.8 oz (69.8 kg)   LMP  (LMP Unknown)   SpO2 98%   BMI 23.05 kg/m  Gen: NAD, resting comfortably CV: RRR no murmurs rubs or gallops Lungs: CTAB no crackles, wheeze, rhonchi Abdomen: soft/nontender/nondistended/normal bowel sounds. No rebound or guarding.  Ext: no edema, but tender over cfl and atfl on lateral malleolus on right- good range of motion but tender with supination and inversion- seems to have effusion at ankle vs ganglion cyst 2+ dp and pt pulses Skin: warm, dry    Assessment and Plan   # Left ankle pain S: Patient reports no known injury or falls- never noted bruising.  Symptoms started a few months ago. Hurts when walking but worse with stooping forward- any position where foot goes under the knee and extends foot upwards A/P: on exam - she is tender over CFL and ATFL over lateral malleolus- with that being said also seems to have fluid and or ganglion cyst or lipoma in this area that is tender- I did not think x-rays alone as beneficial as sports medicine or orthopedic consult- refer to Dr. Madelyn Brunner or Dr.Bokshan- she has had good experience with both    # Tremors S: Patient was started on Breyna steroid plus long-acting beta agonist by pulmonology for chronic cough.  She feels like she has noted tremor since that time A/P: mild and prefers to maintain the inhaler for now but still coughing (she  may reconsider in future)- continue current medications    # Hip pain S:injection last month was helpful for her back but hips are still really bothering her. More hoarseness and that has been a sign of pain for her in the past.   - went down to [facility]  and  pain was so severe that with an excursion she had to use a wheelchair just to get around, some places not able to go at all, and other events had to leave hearly.  A/P: ongoing bilateral hip pain and has been a barrier for her- we will write for handicap placard and wheelchair- does not always need but with flares certainly does as demonstrated  above    Recommended follow up: Return in about 4 months (around 10/08/2023) for physical or sooner if needed.Schedule b4 you leave.- cancel on 24th Future Appointments  Date Time Provider Department Center  06/16/2023  9:00 AM Shelva Majestic, MD [facility]  08/13/2023  9:00 AM [facility] None  08/14/2023  9:00 AM Marcos Eke, PA-C [facility] None  10/14/2023  8:40 AM Rice, Jamesetta Orleans, MD [facility] None   Lab/Order associations:   ICD-10-CM   1. Chronic pain syndrome  G89.4 DME Wheelchair manual    2. Chronic pain of left ankle  M25.572 Ambulatory referral to Orthopedic Surgery   G89.29      Time Spent: 25 minutes of total time (3:25 PM - 3:50 PM) was spent on the date of the encounter performing the following actions: chart review prior to seeing the patient, obtaining history, performing a medically necessary exam, counseling on the treatment plan and reasons for referral, placing orders, and documenting in our EHR.   Return precautions advised.  Tana Conch, MD

## 2023-06-09 ENCOUNTER — Ambulatory Visit: Payer: Medicare HMO | Admitting: Sports Medicine

## 2023-06-09 ENCOUNTER — Other Ambulatory Visit: Payer: Self-pay

## 2023-06-09 ENCOUNTER — Encounter: Payer: Self-pay | Admitting: Sports Medicine

## 2023-06-09 DIAGNOSIS — M25552 Pain in left hip: Secondary | ICD-10-CM | POA: Diagnosis not present

## 2023-06-09 DIAGNOSIS — M25572 Pain in left ankle and joints of left foot: Secondary | ICD-10-CM

## 2023-06-09 DIAGNOSIS — M25372 Other instability, left ankle: Secondary | ICD-10-CM

## 2023-06-09 DIAGNOSIS — M25551 Pain in right hip: Secondary | ICD-10-CM | POA: Diagnosis not present

## 2023-06-09 DIAGNOSIS — M67472 Ganglion, left ankle and foot: Secondary | ICD-10-CM | POA: Diagnosis not present

## 2023-06-09 MED ORDER — BETAMETHASONE SOD PHOS & ACET 6 (3-3) MG/ML IJ SUSP
6.0000 mg | INTRAMUSCULAR | Status: AC | PRN
Start: 2023-06-09 — End: 2023-06-09
  Administered 2023-06-09: 6 mg via INTRA_ARTICULAR

## 2023-06-09 MED ORDER — BUPIVACAINE HCL 0.25 % IJ SOLN
1.0000 mL | INTRAMUSCULAR | Status: AC | PRN
Start: 2023-06-09 — End: 2023-06-09
  Administered 2023-06-09: 1 mL via INTRA_ARTICULAR

## 2023-06-09 MED ORDER — LIDOCAINE HCL 1 % IJ SOLN
1.0000 mL | INTRAMUSCULAR | Status: AC | PRN
Start: 2023-06-09 — End: 2023-06-09
  Administered 2023-06-09: 1 mL

## 2023-06-09 NOTE — Progress Notes (Signed)
Danielle Rocha - 72 y.o. female MRN [id number]  Date of birth: 1950-10-14  Office Visit Note: Visit Date: 06/09/2023 PCP: Shelva Majestic, MD Referred by: Shelva Majestic, MD  Subjective: Chief Complaint  Patient presents with   Left Ankle - Pain   HPI: Danielle Rocha is a pleasant 72 y.o. female who presents today for acute on chronic left ankle pain. Also chronic bilateral lateral hip pain.  Left ankle - pain and some swelling noticed on the lateral aspect of the ankle.  Recently saw her primary physician and thought he may have felt a ganglion cyst.  He has had pain for about 3 months.  No acute injury, but does state he has a history of a rather significant ankle sprain in the past.  She has not been any therapy or taking any medication for this.  Bilateral hip pain -this has been rather bothersome for her, and recently returned from a trip to [place] and was doing a lot of walking, needed to use a wheelchair to get around.  She has been established with pain management, they did start her on gabapentin 300 mg 3 times daily, but she had to back off of this as it made her very drowsy/sleepy.  Has received relief from greater trochanteric injections in the past.  Pertinent ROS were reviewed with the patient and found to be negative unless otherwise specified above in HPI.   Assessment & Plan: Visit Diagnoses:  1. Pain in left ankle and joints of left foot   2. Ganglion cyst of left foot   3. Greater trochanteric pain syndrome of both lower extremities   4. Ankle instability, left    Plan: Discussed with Danielle Rocha the nature of her acute on chronic left ankle pain.  Her ultrasound examination does show a small ganglion cyst over the lateral ankle joint through the ATFL ligament, this likely emanated from her ankle instability with her history of ankle fracture.  She does have some laxity with inversion/eversion testing of this ankle compared to her contralateral side.  For her acute  pain, we did proceed with ultrasound-guided ankle joint and associated ganglion cyst injection, patient tolerated well and had good relief from the anesthetic portion.  We did fit her for a lace up ASO ankle brace to help with stability.  We will get her started in formalized physical therapy to work on ankle stability, proprioception, and strengthening of the ankle.  In terms of her chronic lateral hip pain with greater trochanteric pain syndrome, she is following up with pain management.  She did not tolerate gabapentin 300 mg 3 times daily, advised that she can reduce this to nightly or consider 100 mg 3 times daily.  I would like her to follow back up with pain management so they can continue working on a regimen that is helpful for her.  I will see her back in about 1 month to follow-up on the ankle and above.  Follow-up: Return in about 1 month (around 07/09/2023) for for L-ankle, hips (30-mins).   Meds & Orders: No orders of the defined types were placed in this encounter.   Orders Placed This Encounter  Procedures   Medium Joint Inj   XR Ankle Complete Left   [place] Extrem Low Left Ltd   Ambulatory referral to Physical Therapy     Procedures: Medium Joint Inj: L ankle on 06/09/2023 12:05 PM Indications: pain and diagnostic evaluation Details: 25 G 1.5 in needle, ultrasound-guided anterolateral approach Medications: 1  mL lidocaine 1 %; 1 mL bupivacaine 0.25 %; 6 mg betamethasone acetate-betamethasone sodium phosphate 6 (3-3) MG/ML Outcome: tolerated well, no immediate complications  *Technically successful lateral ankle and associated ganglion cyst injection, left ankle Procedure, treatment alternatives, risks and benefits explained, specific risks discussed. Consent was given by the patient. Immediately prior to procedure a time out was called to verify the correct patient, procedure, equipment, support staff and site/side marked as required. Patient was prepped and draped in the usual  sterile fashion.          Clinical History:  She reports that she has never smoked. She has never used smokeless tobacco. No results for input(s): "HGBA1C", "LABURIC" in the last 8760 hours.  Objective:   Vital Signs: LMP  (LMP Unknown)   Physical Exam  Gen: Well-appearing, in no acute distress; non-toxic CV: Well-perfused. Warm.  Resp: Breathing unlabored on room air; no wheezing. Psych: Fluid speech in conversation; appropriate affect; normal thought process Neuro: Sensation intact throughout. No gross coordination deficits.   Ortho Exam - Left ankle: + TTP just anterior to the lateral malleolus and at the region of the ATFL, there is a small degree of soft tissue swelling here.  There is pain with inversion/eversion stress testing and some laxity noted with plantarflexion and inversion compared to the contralateral ankle.  Negative anterior drawer.  - Gait: + antalgic gait with compensation of b/l GT's  Imaging: [place] Extrem Low Left Ltd  Result Date: 06/09/2023 Limited musculoskeletal ultrasound of the left lower extremity, left lateral ankle was performed today.  Evaluation of the lateral ankle joint shows no effusion.  ATFL was evaluated without any acute abnormality but some chronic thickening from likely prior sprain/tear.  There is a very small cortical irregularity of the lateral malleolus likely from the insertion of the ATFL from prior injury.  No hyperemia or acute cortical irregularity noted today.  Scanning in the region of the sinus tarsi with overlying lateral ankle ligaments/ATFL, there is a hypoechoic region 0.6 x 0.4 cm in nature which is most indicative of a ganglion cyst.  There is associated sono palpation here. *Technically successful ultrasound-guided lateral ankle injection.      Past Medical/Family/Surgical/Social History: Medications & Allergies reviewed per EMR, new medications updated. Patient Active Problem List   Diagnosis Date Noted   Sjogren  syndrome, unspecified (HCC) 05/07/2023   History of lumbar fusion 03/31/2023   Chronic radicular lumbar pain 03/31/2023   Sacroiliac joint pain 03/31/2023   Seasonal allergic rhinitis due to pollen 01/01/2023   Tear of right gluteus medius tendon    Involuntary movements 02/14/2020   Aortic atherosclerosis (HCC) 02/07/2020   Delayed gastric emptying 02/07/2020   Dystonia    Restless legs 07/27/2018   Recurrent UTI 10/07/2016   Major depression in full remission (HCC) 09/02/2016   GERD (gastroesophageal reflux disease) 01/30/2016   History of SI/intentional drug overdose 11/22/2015   Atrial fibrillation (HCC)    Chronic pain syndrome    Hyperlipidemia    Past Medical History:  Diagnosis Date   Anemia    Arthritis    DJD, low back, thumb   Atrial fibrillation (HCC)    Xarelto anticoagulation. Flecainide antiarrythmic    Chicken pox    Chronic pain syndrome    On disability. History of bilateral hip pain, low back pain. Gabapentin 400 BID, percocet 1 tablet daily per prior provider.    Colon polyp    awaiting records   Depression    zoloft 100mg , remeron  30mg  per psychiatry. ambien 10mg  per psychiatry to help wtih sleep element.    Dysrhythmia    Dystonia    described as psychogenic dystonia. Pain and twisting from upper chest and up with triggers "wind, creamy food" on TID ativan per psychiatry previously.    Fibromyalgia    GERD (gastroesophageal reflux disease)    omeprazole OTC   Goiter    states multiple imaging tests, has had biopsies   Hyperlipidemia    lovastatin 20mg    Stroke (HCC)    TIA (left side of face and body decreased sensitivity than right face and side)   TIA (transient ischemic attack)    Vaginal atrophy    estrace vaginal cream   Family History  Problem Relation Age of Onset   Alcohol abuse Mother        possible she had stomach cancer as well but unsure   Hypertension Father    Alcohol abuse Father    Pancreatic cancer Father    Cancer  Brother        spindle cell RLE   Diabetes Brother    Cancer Brother        tonsil cancer   Skin cancer Brother    Colon cancer Neg Hx    Colon polyps Neg Hx    Stomach cancer Neg Hx    Past Surgical History:  Procedure Laterality Date   ATRIAL FIBRILLATION ABLATION     BIOPSY THYROID     fusion l4-l5  09/22/1998   GLUTEUS MINIMUS REPAIR Right 05/19/2022   Procedure: RIGHT HIP GLUTEUS MEDIUS TENDON REPAIR;  Surgeon: Huel Cote, MD;  Location: [facility] OR;  Service: Orthopedics;  Laterality: Right;   LAMINECTOMY     L4-L5   OTHER SURGICAL HISTORY     tennis elbow surgery   piriformis release  09/22/1997   right hip   TONSILLECTOMY AND ADENOIDECTOMY  age 89   VAGINAL HYSTERECTOMY  09/23/1991   Social History   Occupational History   Occupation: retired  Tobacco Use   Smoking status: Never   Smokeless tobacco: Never  Vaping Use   Vaping status: Never Used  Substance and Sexual Activity   Alcohol use: No    Alcohol/week: 0.0 standard drinks of alcohol   Drug use: No   Sexual activity: Not Currently    Partners: Male

## 2023-06-09 NOTE — Progress Notes (Signed)
Patient began having pain in her left ankle about 3 months ago. She states that there was no injury. Her primary care doctor looked at it yesterday and mentioned the possibility of a ganglion cyst. She states that walking up and down stairs will increase her pain to a 4/10. She says that "it feels like a sprained ankle when you move it the wrong way."

## 2023-06-14 ENCOUNTER — Other Ambulatory Visit: Payer: Self-pay | Admitting: Family Medicine

## 2023-06-16 ENCOUNTER — Encounter: Payer: Medicare HMO | Admitting: Family Medicine

## 2023-06-18 ENCOUNTER — Ambulatory Visit: Payer: Medicare HMO | Admitting: Rehabilitative and Restorative Service Providers"

## 2023-07-01 ENCOUNTER — Ambulatory Visit: Payer: Medicare HMO | Admitting: Physical Therapy

## 2023-07-09 ENCOUNTER — Encounter: Payer: Self-pay | Admitting: Sports Medicine

## 2023-07-09 ENCOUNTER — Ambulatory Visit: Payer: Medicare HMO | Admitting: Sports Medicine

## 2023-07-09 DIAGNOSIS — M25551 Pain in right hip: Secondary | ICD-10-CM

## 2023-07-09 DIAGNOSIS — M25572 Pain in left ankle and joints of left foot: Secondary | ICD-10-CM | POA: Diagnosis not present

## 2023-07-09 DIAGNOSIS — M25552 Pain in left hip: Secondary | ICD-10-CM | POA: Diagnosis not present

## 2023-07-09 DIAGNOSIS — M67472 Ganglion, left ankle and foot: Secondary | ICD-10-CM

## 2023-07-09 NOTE — Progress Notes (Signed)
Patient says that her ankle is feeling better after the injection; it still bothers her some but not nearly as much as before. Patient says she wore the ankle brace for about 1 week but stopped wearing it as she felt she did not need it anymore. Patient has not gotten an appointment with physical therapy. She says that some movements still make it hurt (dorsiflexion, plantarflexion, inversion) but that has also improved. Patient is not taking any pain medication.

## 2023-07-09 NOTE — Progress Notes (Signed)
Danielle Rocha - 72 y.o. female MRN [id number]  Date of birth: 1950-12-17  Office Visit Note: Visit Date: 07/09/2023 PCP: Shelva Majestic, MD Referred by: Shelva Majestic, MD  Subjective: Chief Complaint  Patient presents with   Left Ankle - Follow-up   HPI: Danielle Rocha is a pleasant 72 y.o. female who presents today for follow-up of left ankle pain, bilateral hip pain.  Chronic left ankle pain -performed ultrasound-guided injection within the ankle joint over the lateral gutter both in the ankle and around her ganglion cyst last month.  She received quite excellent relief of her pain from this.  She has not yet gotten into formalized physical therapy due to other medical appointments and some recent illnesses, but does want to start this going forward.  No longer needs her ankle brace.  Chronic bilateral hip pain -pain over the lateral aspect of the hip.  She is following with pain management for this, was started on gabapentin 300 mg 3 times daily but this made her very drowsy and so she has had to stop this during the days.  He has received relief from greater trochanteric injections in the past.  Pertinent ROS were reviewed with the patient and found to be negative unless otherwise specified above in HPI.   Assessment & Plan: Visit Diagnoses:  1. Pain in left ankle and joints of left foot   2. Ganglion cyst of left foot   3. Bilateral hip pain   4. Greater trochanteric pain syndrome of both lower extremities    Plan: Danielle Rocha is doing much better after her ultrasound-guided intra-articular ankle injection/lateral ankle ganglion cyst injection.  She still does have some chronic laxity about the ankle which I feel can be improved with stability and physical therapy, she will set up these PT appointments.  She does have chronic greater trochanteric pain syndrome with bilateral hip pain.  She has done extensive PT/HEP for this.  She has received relief from prior injections, the  last was back in May.  She is also working with pain management as this has been many years of pain, was started on gabapentin 3 times daily although this caused her to be very drowsy, she can continue with 300 mg at nighttime only. She is also on Wellbutrin that she may continue.  Encouraged her to follow back up with pain management to find a regimen that will work for her pain with less side effect profile.  She will follow-up next week we will draw our attention to the hips, consider injections in this area if pain not improving.  Follow-up: Next week for bilateral hips, consider GT-inj   Meds & Orders: No orders of the defined types were placed in this encounter.  No orders of the defined types were placed in this encounter.    Procedures: No procedures performed      Clinical History:   She reports that she has never smoked. She has never used smokeless tobacco. No results for input(s): "HGBA1C", "LABURIC" in the last 8760 hours.  Objective:   Vital Signs: LMP  (LMP Unknown)   Physical Exam  Gen: Well-appearing, in no acute distress; non-toxic CV: Regular Rate. Well-perfused. Warm.  Resp: Breathing unlabored on room air; no wheezing. Psych: Fluid speech in conversation; appropriate affect; normal thought process Neuro: Sensation intact throughout. No gross coordination deficits.   Ortho Exam - Left ankle: Very mild TTP just anterior to the lateral malleolus near the ATFL region but significantly improved.  There  is some laxity noted with inversion/eversion stress testing on the side compared to the contralateral ankle.  Stable anterior drawer.  - Bilateral hips: Pain over bilateral greater trochanteric region.  Does have a mildly antalgic gait with compensation but walks unassisted.  Imaging: No results found.  Past Medical/Family/Surgical/Social History: Medications & Allergies reviewed per EMR, new medications updated. Patient Active Problem List   Diagnosis Date Noted    Sjogren syndrome, unspecified (HCC) 05/07/2023   History of lumbar fusion 03/31/2023   Chronic radicular lumbar pain 03/31/2023   Sacroiliac joint pain 03/31/2023   Seasonal allergic rhinitis due to pollen 01/01/2023   Tear of right gluteus medius tendon    Involuntary movements 02/14/2020   Aortic atherosclerosis (HCC) 02/07/2020   Delayed gastric emptying 02/07/2020   Dystonia    Restless legs 07/27/2018   Recurrent UTI 10/07/2016   Major depression in full remission (HCC) 09/02/2016   GERD (gastroesophageal reflux disease) 01/30/2016   History of SI/intentional drug overdose 11/22/2015   Atrial fibrillation (HCC)    Chronic pain syndrome    Hyperlipidemia    Past Medical History:  Diagnosis Date   Anemia    Arthritis    DJD, low back, thumb   Atrial fibrillation (HCC)    Xarelto anticoagulation. Flecainide antiarrythmic    Chicken pox    Chronic pain syndrome    On disability. History of bilateral hip pain, low back pain. Gabapentin 400 BID, percocet 1 tablet daily per prior provider.    Colon polyp    awaiting records   Depression    zoloft 100mg , remeron 30mg  per psychiatry. ambien 10mg  per psychiatry to help wtih sleep element.    Dysrhythmia    Dystonia    described as psychogenic dystonia. Pain and twisting from upper chest and up with triggers "wind, creamy food" on TID ativan per psychiatry previously.    Fibromyalgia    GERD (gastroesophageal reflux disease)    omeprazole OTC   Goiter    states multiple imaging tests, has had biopsies   Hyperlipidemia    lovastatin 20mg    Stroke (HCC)    TIA (left side of face and body decreased sensitivity than right face and side)   TIA (transient ischemic attack)    Vaginal atrophy    estrace vaginal cream   Family History  Problem Relation Age of Onset   Alcohol abuse Mother        possible she had stomach cancer as well but unsure   Hypertension Father    Alcohol abuse Father    Pancreatic cancer Father     Cancer Brother        spindle cell RLE   Diabetes Brother    Cancer Brother        tonsil cancer   Skin cancer Brother    Colon cancer Neg Hx    Colon polyps Neg Hx    Stomach cancer Neg Hx    Past Surgical History:  Procedure Laterality Date   ATRIAL FIBRILLATION ABLATION     BIOPSY THYROID     fusion l4-l5  09/22/1998   GLUTEUS MINIMUS REPAIR Right 05/19/2022   Procedure: RIGHT HIP GLUTEUS MEDIUS TENDON REPAIR;  Surgeon: Huel Cote, MD;  Location: [facility] OR;  Service: Orthopedics;  Laterality: Right;   LAMINECTOMY     L4-L5   OTHER SURGICAL HISTORY     tennis elbow surgery   piriformis release  09/22/1997   right hip   TONSILLECTOMY AND ADENOIDECTOMY  age 57  VAGINAL HYSTERECTOMY  09/23/1991   Social History   Occupational History   Occupation: retired  Tobacco Use   Smoking status: Never   Smokeless tobacco: Never  Vaping Use   Vaping status: Never Used  Substance and Sexual Activity   Alcohol use: No    Alcohol/week: 0.0 standard drinks of alcohol   Drug use: No   Sexual activity: Not Currently    Partners: Male

## 2023-07-15 ENCOUNTER — Encounter: Payer: Self-pay | Admitting: Family Medicine

## 2023-07-16 ENCOUNTER — Other Ambulatory Visit: Payer: Self-pay

## 2023-07-16 ENCOUNTER — Encounter: Payer: Self-pay | Admitting: Sports Medicine

## 2023-07-16 ENCOUNTER — Ambulatory Visit: Payer: Medicare HMO | Admitting: Sports Medicine

## 2023-07-16 DIAGNOSIS — M25552 Pain in left hip: Secondary | ICD-10-CM | POA: Diagnosis not present

## 2023-07-16 DIAGNOSIS — M25551 Pain in right hip: Secondary | ICD-10-CM | POA: Diagnosis not present

## 2023-07-16 MED ORDER — LIDOCAINE HCL 1 % IJ SOLN
2.0000 mL | INTRAMUSCULAR | Status: AC | PRN
Start: 2023-07-16 — End: 2023-07-16
  Administered 2023-07-16: 2 mL

## 2023-07-16 MED ORDER — BUPIVACAINE HCL 0.25 % IJ SOLN
2.0000 mL | INTRAMUSCULAR | Status: AC | PRN
Start: 2023-07-16 — End: 2023-07-16
  Administered 2023-07-16: 2 mL via INTRA_ARTICULAR

## 2023-07-16 MED ORDER — BETAMETHASONE SOD PHOS & ACET 6 (3-3) MG/ML IJ SUSP
6.0000 mg | INTRAMUSCULAR | Status: AC | PRN
Start: 2023-07-16 — End: 2023-07-16
  Administered 2023-07-16: 6 mg via INTRA_ARTICULAR

## 2023-07-16 MED ORDER — BETAMETHASONE SOD PHOS & ACET 6 (3-3) MG/ML IJ SUSP
6.0000 mg | INTRAMUSCULAR | Status: AC | PRN
  Administered 2023-07-16: 6 mg via INTRA_ARTICULAR

## 2023-07-16 NOTE — Progress Notes (Addendum)
Office Visit Note   Patient: Danielle Rocha           Date of Birth: 08/01/1951           MRN: [id number] Visit Date: 07/16/2023              Requested by: Shelva Majestic, MD [address] [place],  [place] [address] PCP: Shelva Majestic, MD  Medical Resident, Sports Medicine Fellow - Attending Physician Addendum:   I have independently interviewed and examined the patient myself. I have discussed the above with the original author and agree with their documentation. My edits for correction/addition/clarification have been made, see any changes above and below.   In summary, very pleasant 72 year old female who presents for bilateral hip pain with greater trochanteric pain syndrome with a degree of bursitis.  She has had issues with this in the past but has had many months without injections.  She is going to the beach and would like to be feeling well for this, through shared decision making did proceed with bilateral ultrasound-guided greater trochanteric injections, tolerated well.  Discussed postinjection protocol.  I would like her to start back on her home hip stabilization exercises when she returns from vacation to work on strength and stability of her hip abductors. May use ice, Tylenol as needed. She will work on following up with her pain management physician as well.  May see me on an as-needed basis.  Madelyn Brunner, DO Primary Care Sports Medicine Physician  [facility] - Orthopedics   Assessment & Plan: Visit Diagnoses:  1. Bilateral hip pain   2. Greater trochanteric pain syndrome of both lower extremities    Plan: Patient with noted bilateral trochanteric pain syndrome.  Patient does get mild relief from steroid injections of the affected area, patient would like bilateral injections done today.  Patient was injected today under ultrasound guidance without any complications and patient tolerated procedure well.  Did discuss with patient that patient's  symptoms appear to be more related to her trochanteric bursitis area however she does have some noted degenerative changes of the hip joint bilaterally and she also notes some inguinal area pain.  If patient has minimal benefit from trochanteric injections, can consider hip joint injections to see if there is further relief.  Patient was also advised to follow-up with physical therapy regarding her left ankle.  Patient is agreeable with plan and will follow-up if any concerns.  Follow-Up Instructions: Return if symptoms worsen or fail to improve.   Orders:  Orders Placed This Encounter  Procedures   Large Joint Inj: R greater trochanter   Large Joint Inj: L greater trochanter   US Guided Needle Placement - No Linked Charges   Meds ordered this encounter  Medications   bupivacaine (MARCAINE) 0.25 % (with pres) injection 2 mL   lidocaine (XYLOCAINE) 1 % (with pres) injection 2 mL   betamethasone acetate-betamethasone sodium phosphate (CELESTONE) injection 6 mg   bupivacaine (MARCAINE) 0.25 % (with pres) injection 2 mL   lidocaine (XYLOCAINE) 1 % (with pres) injection 2 mL   betamethasone acetate-betamethasone sodium phosphate (CELESTONE) injection 6 mg      Procedures: Large Joint Inj: R greater trochanter on 07/16/2023 2:28 PM Indications: pain Details: 22 G 3.5 in needle, ultrasound-guided lateral approach Medications: 2 mL lidocaine 1 %; 2 mL bupivacaine 0.25 %; 6 mg betamethasone acetate-betamethasone sodium phosphate 6 (3-3) MG/ML  US-Guided Greater Trochanteric Bursa Injection, Right After discussion on risks/benefits/indications and informed  verbal consent was obtained, a timeout was performed. The patient was lying in lateral recumbent position on exam table. Using ultrasound guidance, the greater trochanter was identified. The area overlying the trochanteric bursa was then prepped with Betadine and alcohol swabs. Following sterile precautions, ultrasound was reapplied to visualize  needle guidance with a 22-gauge 3.5" needle utilizing an in-plane approach to inject the bursa with 2:2:1 lidocaine:bupivicaine:betamethasone. Delivery of the injectate was visualized into the region of hypoechoic fluid of the greater trochanteric bursa. Patient tolerated procedure well without immediate complications.    Procedure, treatment alternatives, risks and benefits explained, specific risks discussed. Consent was given by the patient. Patient was prepped and draped in the usual sterile fashion.    Large Joint Inj: L greater trochanter on 07/16/2023 2:33 PM Indications: pain Details: 22 G 3.5 in needle, ultrasound-guided lateral approach Medications: 2 mL lidocaine 1 %; 2 mL bupivacaine 0.25 %; 6 mg betamethasone acetate-betamethasone sodium phosphate 6 (3-3) MG/ML  US-Guided Greater Trochanteric Bursa Injection, Left After discussion on risks/benefits/indications and informed verbal consent was obtained, a timeout was performed. The patient was lying in lateral recumbent position on exam table. Using ultrasound guidance, the greater trochanter was identified. The area overlying the trochanteric bursa was then prepped with Betadine and alcohol swabs. Following sterile precautions, ultrasound was reapplied to visualize needle guidance with a 22-gauge 3.5" needle utilizing an in-plane approach to inject the bursa with 2:2:1 lidocaine:bupivicaine:betamethasone. Delivery of the injectate was visualized into the region of hypoechoic fluid of the greater trochanteric bursa. Patient tolerated procedure well without immediate complications.    Procedure, treatment alternatives, risks and benefits explained, specific risks discussed. Consent was given by the patient. Patient was prepped and draped in the usual sterile fashion.       Clinical Data: No additional findings.   Subjective: Chief Complaint  Patient presents with   Right Hip - Pain    Injection   Left Hip - Pain    Injection     Patient is presenting for follow-up of her bilateral hip pain.  Patient states that it has been a while since her previous injection and that she usually gets some mild relief after the injection.  Patient notes that she is going to the [employer] this weekend and was hoping to get some relief.  Patient is complaining of bilateral hip pain that is located near the groin but also goes into the lateral hip/trochanter area.  Patient also notes that her left ankle has been getting better.  Patient has not started physical therapy yet but notes that when she starts physical therapy she believes that she will get even better relief.     Review of Systems   Objective: Vital Signs: LMP  (LMP Unknown)   Physical Exam  Hip, Bilateral: No obvious rash, erythema, ecchymosis, or edema. Passive Log Roll noted with some pain. ROM full in all directions; Strength 4/5 in abduction decreased bilaterally with pain. Non-antalgic gait without trendelenburg / unsteadiness. Greater trochanter with tenderness to palpation bilaterally.  Provocative Testing:    - FABER/FADIR test: POS/POS   Ortho Exam  Specialty Comments:  EXAM: MRI CERVICAL SPINE WITHOUT CONTRAST   TECHNIQUE: Multiplanar, multisequence MR imaging of the cervical spine was performed. No intravenous contrast was administered.   COMPARISON:  01/05/2022 brain MRI   FINDINGS: Alignment: Mild anterolisthesis at C2-3.   Vertebrae: No fracture, evidence of discitis, or bone lesion.   Cord: Normal signal and morphology.   Posterior Fossa, vertebral arteries, paraspinal  tissues: Negative.   Disc levels:   C2-3: Slight anterolisthesis although no significant degenerative changes noted   C3-4: Uncovertebral ridging asymmetric to the left where there is moderate foraminal stenosis.   C4-5: Facet spurring and left uncovertebral ridging with mild left foraminal narrowing.   C5-6: Disc narrowing with bulging and uncovertebral  ridging. Mild left foraminal narrowing   C6-7: Degenerative facet spurring asymmetric to the left. Mild left uncovertebral ridging. Moderate left foraminal narrowing. Right foraminal root sleeve cyst.   C7-T1:Unremarkable.   IMPRESSION: 1. Cervical spine degeneration causing moderate foraminal stenosis on the left at C3-4 and C6-7. 2. No inflammation or impingement seen at the skull base to explain radiation to the head.     Electronically Signed   By: Tiburcio Pea M.D.   On: 04/10/2022 17:05  Imaging: No results found.   PMFS History: Patient Active Problem List   Diagnosis Date Noted   Sjogren syndrome, unspecified (HCC) 05/07/2023   History of lumbar fusion 03/31/2023   Chronic radicular lumbar pain 03/31/2023   Sacroiliac joint pain 03/31/2023   Seasonal allergic rhinitis due to pollen 01/01/2023   Tear of right gluteus medius tendon    Involuntary movements 02/14/2020   Aortic atherosclerosis (HCC) 02/07/2020   Delayed gastric emptying 02/07/2020   Dystonia    Restless legs 07/27/2018   Recurrent UTI 10/07/2016   Major depression in full remission (HCC) 09/02/2016   GERD (gastroesophageal reflux disease) 01/30/2016   History of SI/intentional drug overdose 11/22/2015   Atrial fibrillation (HCC)    Chronic pain syndrome    Hyperlipidemia    Past Medical History:  Diagnosis Date   Anemia    Arthritis    DJD, low back, thumb   Atrial fibrillation (HCC)    Xarelto anticoagulation. Flecainide antiarrythmic    Chicken pox    Chronic pain syndrome    On disability. History of bilateral hip pain, low back pain. Gabapentin 400 BID, percocet 1 tablet daily per prior provider.    Colon polyp    awaiting records   Depression    zoloft 100mg , remeron 30mg  per psychiatry. ambien 10mg  per psychiatry to help wtih sleep element.    Dysrhythmia    Dystonia    described as psychogenic dystonia. Pain and twisting from upper chest and up with triggers "wind, creamy  food" on TID ativan per psychiatry previously.    Fibromyalgia    GERD (gastroesophageal reflux disease)    omeprazole OTC   Goiter    states multiple imaging tests, has had biopsies   Hyperlipidemia    lovastatin 20mg    Stroke (HCC)    TIA (left side of face and body decreased sensitivity than right face and side)   TIA (transient ischemic attack)    Vaginal atrophy    estrace vaginal cream    Family History  Problem Relation Age of Onset   Alcohol abuse Mother        possible she had stomach cancer as well but unsure   Hypertension Father    Alcohol abuse Father    Pancreatic cancer Father    Cancer Brother        spindle cell RLE   Diabetes Brother    Cancer Brother        tonsil cancer   Skin cancer Brother    Colon cancer Neg Hx    Colon polyps Neg Hx    Stomach cancer Neg Hx     Past Surgical History:  Procedure Laterality Date  ATRIAL FIBRILLATION ABLATION     BIOPSY THYROID     fusion l4-l5  09/22/1998   GLUTEUS MINIMUS REPAIR Right 05/19/2022   Procedure: RIGHT HIP GLUTEUS MEDIUS TENDON REPAIR;  Surgeon: Huel Cote, MD;  Location: [facility] OR;  Service: Orthopedics;  Laterality: Right;   LAMINECTOMY     L4-L5   OTHER SURGICAL HISTORY     tennis elbow surgery   piriformis release  09/22/1997   right hip   TONSILLECTOMY AND ADENOIDECTOMY  age 36   VAGINAL HYSTERECTOMY  09/23/1991   Social History   Occupational History   Occupation: retired  Tobacco Use   Smoking status: Never   Smokeless tobacco: Never  Vaping Use   Vaping status: Never Used  Substance and Sexual Activity   Alcohol use: No    Alcohol/week: 0.0 standard drinks of alcohol   Drug use: No   Sexual activity: Not Currently    Partners: Male

## 2023-07-17 ENCOUNTER — Ambulatory Visit: Payer: Medicare HMO | Admitting: Sports Medicine

## 2023-07-21 ENCOUNTER — Encounter: Payer: Self-pay | Admitting: Family Medicine

## 2023-08-08 ENCOUNTER — Other Ambulatory Visit: Payer: Self-pay | Admitting: Family Medicine

## 2023-08-14 ENCOUNTER — Ambulatory Visit: Payer: Medicare HMO | Admitting: Physician Assistant

## 2023-08-21 ENCOUNTER — Other Ambulatory Visit: Payer: Self-pay | Admitting: Physician Assistant

## 2023-08-23 ENCOUNTER — Other Ambulatory Visit: Payer: Self-pay | Admitting: Family Medicine

## 2023-08-28 ENCOUNTER — Other Ambulatory Visit: Payer: Self-pay

## 2023-08-28 MED ORDER — AIMOVIG 140 MG/ML ~~LOC~~ SOAJ: 140.0000 mg | SUBCUTANEOUS | 5 refills | Status: AC

## 2023-09-04 ENCOUNTER — Ambulatory Visit (INDEPENDENT_AMBULATORY_CARE_PROVIDER_SITE_OTHER): Payer: Medicare HMO | Admitting: Family Medicine

## 2023-09-04 VITALS — BP 110/60 | HR 72 | Temp 98.1°F | Ht 68.5 in | Wt 154.0 lb

## 2023-09-04 DIAGNOSIS — F325 Major depressive disorder, single episode, in full remission: Secondary | ICD-10-CM

## 2023-09-04 DIAGNOSIS — I4891 Unspecified atrial fibrillation: Secondary | ICD-10-CM | POA: Diagnosis not present

## 2023-09-04 DIAGNOSIS — E785 Hyperlipidemia, unspecified: Secondary | ICD-10-CM

## 2023-09-04 DIAGNOSIS — B001 Herpesviral vesicular dermatitis: Secondary | ICD-10-CM

## 2023-09-04 DIAGNOSIS — G894 Chronic pain syndrome: Secondary | ICD-10-CM

## 2023-09-04 MED ORDER — RIVAROXABAN 20 MG PO TABS: 20.0000 mg | ORAL_TABLET | Freq: Every day | ORAL | 3 refills | Status: DC

## 2023-09-04 MED ORDER — FLECAINIDE ACETATE 100 MG PO TABS: 100.0000 mg | ORAL_TABLET | Freq: Two times a day (BID) | ORAL | 3 refills | Status: AC

## 2023-09-04 MED ORDER — FLUTICASONE PROPIONATE 50 MCG/ACT NA SUSP: 2.0000 | Freq: Every day | NASAL | 3 refills | Status: AC

## 2023-09-04 MED ORDER — PANTOPRAZOLE SODIUM 20 MG PO TBEC: 20.0000 mg | DELAYED_RELEASE_TABLET | Freq: Two times a day (BID) | ORAL | 3 refills | Status: AC

## 2023-09-04 MED ORDER — VALACYCLOVIR HCL 1 G PO TABS: ORAL_TABLET | ORAL | 5 refills | Status: AC

## 2023-09-04 MED ORDER — TRAZODONE HCL 50 MG PO TABS: 50.0000 mg | ORAL_TABLET | Freq: Every evening | ORAL | 3 refills | Status: AC | PRN

## 2023-09-04 NOTE — Patient Instructions (Signed)
Trial valtrex at first sign of fever blister  We will miss you like crazy and wish you the best  Recommended follow up: establish with new doctor

## 2023-09-04 NOTE — Progress Notes (Signed)
Phone [phone] In person visit   Subjective:   Danielle Rocha is a 72 y.o. year old very pleasant female patient who presents for/with See problem oriented charting Chief Complaint  Patient presents with   Mouth Lesions         Past Medical History-  Patient Active Problem List   Diagnosis Date Noted   Sjogren syndrome, unspecified (HCC) 05/07/2023    Priority: High   Dystonia     Priority: High   Major depression in full remission (HCC) 09/02/2016    Priority: High   History of SI/intentional drug overdose 11/22/2015    Priority: High   Atrial fibrillation (HCC)     Priority: High   Chronic pain syndrome     Priority: High   Seasonal allergic rhinitis due to pollen 01/01/2023    Priority: Medium    Aortic atherosclerosis (HCC) 02/07/2020    Priority: Medium    Delayed gastric emptying 02/07/2020    Priority: Medium    Restless legs 07/27/2018    Priority: Medium    Recurrent UTI 10/07/2016    Priority: Medium    Hyperlipidemia     Priority: Medium    GERD (gastroesophageal reflux disease) 01/30/2016    Priority: Low   History of lumbar fusion 03/31/2023   Chronic radicular lumbar pain 03/31/2023   Sacroiliac joint pain 03/31/2023   Tear of right gluteus medius tendon    Involuntary movements 02/14/2020    Medications- reviewed and updated Current Outpatient Medications  Medication Sig Dispense Refill   valACYclovir (VALTREX) 1000 MG tablet Take 2 pills twice a day for 1 day at first sign of cold sore 28 tablet 5   acetaminophen (TYLENOL) 500 MG tablet Take 1,000 mg by mouth every 6 (six) hours as needed for mild pain, moderate pain, fever or headache.     budesonide-formoterol (BREYNA) 160-4.5 MCG/ACT inhaler Inhale 2 puffs into the lungs 2 (two) times daily. 1 each 12   buPROPion (WELLBUTRIN XL) 300 MG 24 hr tablet Take 300 mg by mouth daily. Through psychiatry     cephALEXin (KEFLEX) 250 MG capsule Take 250 mg by mouth daily.     Cholecalciferol  (VITAMIN D) 125 MCG (5000 UT) CAPS Take 5,000 Units by mouth daily.     Coenzyme Q10 200 MG TABS Take 200 mg by mouth daily.     Cyanocobalamin (B-12 PO) Take 1 tablet by mouth daily.     Erenumab-aooe (AIMOVIG) 140 MG/ML SOAJ Inject 140 mg into the skin every 28 (twenty-eight) days. 1.12 mL 5   flecainide (TAMBOCOR) 100 MG tablet Take 1 tablet (100 mg total) by mouth 2 (two) times daily. 180 tablet 3   fluticasone (FLONASE) 50 MCG/ACT nasal spray Place 2 sprays into both nostrils daily. 16 g 3   gabapentin (NEURONTIN) 300 MG capsule Take 1 capsule (300 mg total) by mouth every 8 (eight) hours. 90 capsule 1   ipratropium (ATROVENT) 0.03 % nasal spray Place 2 sprays into both nostrils every 12 (twelve) hours. 30 mL 12   LORazepam (ATIVAN) 0.5 MG tablet Take 1 tablet (0.5 mg total) by mouth daily as needed for anxiety (and dystonia). (Patient not taking: Reported on 06/08/2023) 10 tablet 0   Magnesium Oxide 420 MG TABS Take 420 mg by mouth daily.     melatonin 5 MG TABS Take 5 mg by mouth at bedtime.     Multiple Vitamin (MULTIVITAMIN WITH MINERALS) TABS tablet Take 1 tablet by mouth daily.  ondansetron (ZOFRAN ODT) 4 MG disintegrating tablet Take 1 tablet (4 mg total) by mouth every 8 (eight) hours as needed for nausea or vomiting. (Patient not taking: Reported on 06/08/2023) 20 tablet 0   pantoprazole (PROTONIX) 20 MG tablet Take 1 tablet (20 mg total) by mouth 2 (two) times daily before a meal. 180 tablet 3   pyridOXINE (VITAMIN B6) 100 MG tablet Take 100 mg by mouth daily.     rivaroxaban (XARELTO) 20 MG TABS tablet Take 1 tablet (20 mg total) by mouth daily with supper. 90 tablet 3   sertraline (ZOLOFT) 50 MG tablet Take 50 mg by mouth in the morning and at bedtime. Through psychiatry     SUMAtriptan (IMITREX) 100 MG tablet TAKE ONE TABLET BY MOUTH AT ONSET OF MIGRAINE FOR UP TO 1 DOSE 10 tablet 5   traZODone (DESYREL) 50 MG tablet Take 1 tablet (50 mg total) by mouth at bedtime as needed. for  sleep 90 tablet 3   No current facility-administered medications for this visit.     Objective:  BP 110/60   Pulse 72   Temp 98.1 F (36.7 C)   Ht 5' 8.5" (1.74 m)   Wt 154 lb (69.9 kg)   LMP  (LMP Unknown)   SpO2 96%   BMI 23.08 kg/m  Gen: NAD, resting comfortably CV: regular rate Lungs: CTAB no crackles, wheeze, rhonchi Ext: no edema Skin: warm, dry, healing slightly protruding lesion on upper lip     Assessment and Plan   #social update- moving to [place]- we will miss her! Great opportunity for her husband and will be closer to son's caregiver (he has down's syndrome and caregiver will care for him after parents are decesased)  # Lip lesions S:has spots pop up on upper lip- painful and irritating. 2-3 months of issues if not not more- has had most of life but not with this frequency  A/P: these appear to be recurrent fever blisters- will treat with as needed high dose valtrex for 1 day- encouraged to use early in course  # Sjogren's syndrome-had pending rheumatology referral next year with Dr. Dimple Casey but will likely reestablish care in [place]   #Atrial fibrillation- Dr. Tenny Craw followed in past- last visit 2022 though S:Compliant with Xarelto for anticoagulation.  Flecainide as an antiarrhythmic.  A/P: appropriately anticoagulated and rate controlled- continue current medicine   #migraines- better on aimovig with Dr. Terrace Arabia of neurology this year   #Chronic pain syndrome S:On disability due to chronic pain syndrome predating being cared for at [place]. Extensive prior orthopedic workup. issues mainly in low back and hips- but more recent years in her neck as well. In the past required narcotics but was able to wean off after suicide attempt by overdose with pain meds   Has been incredibly active with upcoming move back to [place] and has flared up most of her chronic pain areas- including both hips, neck, low back. Neck has felt more stiff- hoping she can do another  injection.  - still on gabapentin 300 mg just at night- was feeling too tired in the daytime- may be willing to try twice a day and taking it a little easier now that the house is prepped.  A/P: chronic pain syndrome in multiple joints- continue gabapentin -she's going to try to get in for C spine injection before moving to [place] - had significant longer term relief previously and also sometimes helps other joints   #Major depressive disorder S: currently on sertraline  50 mg twice daily (Cymbalta made her feel off)  and wellbutrin 300 XL in the AM as well, trazodone 50 mg before bed- able to sleep most of time with half, sometime full pill -working with psychiatry Dr. Donell Beers.  -prior suicide attempt as noted- has done very very well in recent years    09/04/2023    3:14 PM 06/08/2023    3:03 PM 05/07/2023    8:05 AM  Depression screen PHQ 2/9  Decreased Interest 0 0 0  Down, Depressed, Hopeless 0 0 0  PHQ - 2 Score 0 0 0  Altered sleeping 0 0 0  Tired, decreased energy 0 2 1  Change in appetite 0 0 0  Feeling bad or failure about yourself  0 0 0  Trouble concentrating 0 0 0  Moving slowly or fidgety/restless 0 0 0  Suicidal thoughts 0 0 0  PHQ-9 Score 0 2 1  Difficult doing work/chores Not difficult at all Not difficult at all Not difficult at all  A/P: full remisison- continue current medications and neurology follow up    #Hyperlipidemia/aortic atherosclerosis- incidental finding on 11/04/19 CT S:compliant rosuvastatin 20mg . LDL goal at least under 100 prefer under 70. She wants to work on lifestyle as much as able Lab Results  Component Value Date   CHOL 199 11/25/2022   HDL 82.70 11/25/2022   LDLCALC 97 11/25/2022   LDLDIRECT 104.0 11/01/2019   TRIG 97.0 11/25/2022   CHOLHDL 2 11/25/2022  A/P: imperfect but reasonable control- continue current medications     #GERD- prior saw GI now Dr. Christella Hartigan S:compliant with pantoprazole 20 mg BID PPI- gastritis history 2022. prior  just on omeprazole otc.  She is on b12 and folate in regards to PPI risk of lowering these.   A/P: doing well without recurrence of gastritis on current dose- continue current medications    #Prior Delayed gastric emptying including on study- with narcotics but now off and symptoms improved   #Restless legs S:restless legs has been better with b12 and ferrous sulfate/iron. resolved with iron repletion A/P: did not report issues today- continue to monitor    %Recurrent UTI- has seen urology. Had cystoscopy. On chronic keflex for prevention   Recommended follow up: no follow up- moving to [facility] Future Appointments  Date Time Provider Department Center  10/14/2023  8:40 AM Dimple Casey, Jamesetta Orleans, MD [facility] None  12/23/2023  9:00 AM Shelva Majestic, MD [facility]    Lab/Order associations: No diagnosis found.  Meds ordered this encounter  Medications   valACYclovir (VALTREX) 1000 MG tablet    Sig: Take 2 pills twice a day for 1 day at first sign of cold sore    Dispense:  28 tablet    Refill:  5   flecainide (TAMBOCOR) 100 MG tablet    Sig: Take 1 tablet (100 mg total) by mouth 2 (two) times daily.    Dispense:  180 tablet    Refill:  3   fluticasone (FLONASE) 50 MCG/ACT nasal spray    Sig: Place 2 sprays into both nostrils daily.    Dispense:  16 g    Refill:  3   pantoprazole (PROTONIX) 20 MG tablet    Sig: Take 1 tablet (20 mg total) by mouth 2 (two) times daily before a meal.    Dispense:  180 tablet    Refill:  3   traZODone (DESYREL) 50 MG tablet    Sig: Take 1 tablet (50 mg total) by mouth  at bedtime as needed. for sleep    Dispense:  90 tablet    Refill:  3   rivaroxaban (XARELTO) 20 MG TABS tablet    Sig: Take 1 tablet (20 mg total) by mouth daily with supper.    Dispense:  90 tablet    Refill:  3    Return precautions advised.  Tana Conch, MD

## 2023-09-21 ENCOUNTER — Other Ambulatory Visit: Payer: Self-pay | Admitting: Physical Medicine and Rehabilitation

## 2023-09-21 DIAGNOSIS — M5412 Radiculopathy, cervical region: Secondary | ICD-10-CM

## 2023-10-01 ENCOUNTER — Encounter: Payer: Self-pay | Admitting: Family Medicine

## 2023-10-02 ENCOUNTER — Encounter: Payer: Self-pay | Admitting: Physical Medicine and Rehabilitation

## 2023-10-02 NOTE — Telephone Encounter (Unsigned)
 Copied from CRM [id number]. Topic: Clinical - Medication Question >> Oct 02, 2023 11:11 AM Tiffany H wrote: Reason for CRM: Patient called to advise that she is supposed to have a neck injection on Monday. Patient has been told for the past few days that Dr. Ethyl at [facility] has been sending us  requests for prior authorization. No record from Dr. Biagio office sending any media. Patient is scheduled to have neck injection Monday. Was told that if Dr. Katrinka cannot produce prior authorization by 3pm, Monday appointment will be cancelled.   Patient advised she's moving to [place]  this month and this was going to be her final injection before moving. Patient requests an expedited turnaround. Advised due to issue with Dr. Biagio office, sending message [facility] as a courtesy.

## 2023-10-05 ENCOUNTER — Encounter: Payer: Self-pay | Admitting: Physical Medicine and Rehabilitation

## 2023-10-05 ENCOUNTER — Other Ambulatory Visit: Payer: Self-pay

## 2023-10-05 ENCOUNTER — Ambulatory Visit (INDEPENDENT_AMBULATORY_CARE_PROVIDER_SITE_OTHER): Payer: Medicare HMO | Admitting: Physical Medicine and Rehabilitation

## 2023-10-05 ENCOUNTER — Encounter: Payer: Self-pay | Admitting: Family Medicine

## 2023-10-05 DIAGNOSIS — M5412 Radiculopathy, cervical region: Secondary | ICD-10-CM | POA: Diagnosis not present

## 2023-10-05 MED ORDER — GABAPENTIN 300 MG PO CAPS: 300.0000 mg | ORAL_CAPSULE | Freq: Three times a day (TID) | ORAL | 1 refills | Status: AC

## 2023-10-05 MED ORDER — METHYLPREDNISOLONE ACETATE 40 MG/ML IJ SUSP
40.0000 mg | Freq: Once | INTRAMUSCULAR | Status: AC
  Administered 2023-10-05: 40 mg

## 2023-10-05 NOTE — Progress Notes (Signed)
 Danielle Rocha - 73 y.o. female MRN [id number]  Date of birth: 18-Nov-1950  Office Visit Note: Visit Date: 10/05/2023 PCP: Katrinka Garnette KIDD, MD Referred by: Katrinka Garnette KIDD, MD  Subjective: Chief Complaint  Patient presents with   Neck - Pain   HPI:  Charlisa Cham is a 73 y.o. female who comes in today for planned repeat Right C7-T1  Cervical Interlaminar epidural steroid injection with fluoroscopic guidance.  The patient has failed conservative care including home exercise, medications, time and activity modification.  This injection will be diagnostic and hopefully therapeutic.  Please see requesting physician notes for further details and justification. Patient received more than 50% pain relief from prior injection.   Referring: Duwaine Pouch, FNP   ROS Otherwise per HPI.  Assessment & Plan: Visit Diagnoses:    ICD-10-CM   1. Radiculopathy, cervical region  M54.12 XR C-ARM NO REPORT    Epidural Steroid injection    methylPREDNISolone  acetate (DEPO-MEDROL ) injection 40 mg      Plan: No additional findings.   Meds & Orders:  Meds ordered this encounter  Medications   methylPREDNISolone  acetate (DEPO-MEDROL ) injection 40 mg    Orders Placed This Encounter  Procedures   XR C-ARM NO REPORT   Epidural Steroid injection    Follow-up: Return for visit to requesting provider as needed.   Procedures: No procedures performed  Cervical Epidural Steroid Injection - Interlaminar Approach with Fluoroscopic Guidance  Patient: Danielle Rocha      Date of Birth: 1951/01/18 MRN: [id number] PCP: Katrinka Garnette KIDD, MD      Visit Date: 10/05/2023   Universal Protocol:    Date/Time: 10/04/2508:48 AM  Consent Given By: the patient  Position: PRONE  Additional Comments: Vital signs were monitored before and after the procedure. Patient was prepped and draped in the usual sterile fashion. The correct patient, procedure, and site was verified.   Injection Procedure  Details:   Procedure diagnoses: Radiculopathy, cervical region [M54.12]    Meds Administered:  Meds ordered this encounter  Medications   methylPREDNISolone  acetate (DEPO-MEDROL ) injection 40 mg     Laterality: Right  Location/Site: C7-T1  Needle: 3.5 in., 20 ga. Tuohy  Needle Placement: Paramedian epidural space  Findings:  -Comments: Excellent flow of contrast into the epidural space.  Procedure Details: Using a paramedian approach from the side mentioned above, the region overlying the inferior lamina was localized under fluoroscopic visualization and the soft tissues overlying this structure were infiltrated with 4 ml. of 1% Lidocaine  without Epinephrine. A # 20 gauge, Tuohy needle was inserted into the epidural space using a paramedian approach.  The epidural space was localized using loss of resistance along with contralateral oblique bi-planar fluoroscopic views.  After negative aspirate for air, blood, and CSF, a 2 ml. volume of Isovue-250 was injected into the epidural space and the flow of contrast was observed. Radiographs were obtained for documentation purposes.   The injectate was administered into the level noted above.  Additional Comments:  No complications occurred Dressing: 2 x 2 sterile gauze and Band-Aid    Post-procedure details: Patient was observed during the procedure. Post-procedure instructions were reviewed.  Patient left the clinic in stable condition.   Clinical History: EXAM: MRI CERVICAL SPINE WITHOUT CONTRAST   TECHNIQUE: Multiplanar, multisequence MR imaging of the cervical spine was performed. No intravenous contrast was administered.   COMPARISON:  01/05/2022 brain MRI   FINDINGS: Alignment: Mild anterolisthesis at C2-3.   Vertebrae: No fracture, evidence of discitis, or bone  lesion.   Cord: Normal signal and morphology.   Posterior Fossa, vertebral arteries, paraspinal tissues: Negative.   Disc levels:   C2-3: Slight  anterolisthesis although no significant degenerative changes noted   C3-4: Uncovertebral ridging asymmetric to the left where there is moderate foraminal stenosis.   C4-5: Facet spurring and left uncovertebral ridging with mild left foraminal narrowing.   C5-6: Disc narrowing with bulging and uncovertebral ridging. Mild left foraminal narrowing   C6-7: Degenerative facet spurring asymmetric to the left. Mild left uncovertebral ridging. Moderate left foraminal narrowing. Right foraminal root sleeve cyst.   C7-T1:Unremarkable.   IMPRESSION: 1. Cervical spine degeneration causing moderate foraminal stenosis on the left at C3-4 and C6-7. 2. No inflammation or impingement seen at the skull base to explain radiation to the head.     Electronically Signed   By: Dorn Roulette M.D.   On: 04/10/2022 17:05     Objective:  VS:  HT:    WT:   BMI:     BP:   HR: bpm  TEMP: ( )  RESP:  Physical Exam Vitals and nursing note reviewed.  Constitutional:      General: She is not in acute distress.    Appearance: Normal appearance. She is not ill-appearing.  HENT:     Head: Normocephalic and atraumatic.     Right Ear: External ear normal.     Left Ear: External ear normal.  Eyes:     Extraocular Movements: Extraocular movements intact.  Cardiovascular:     Rate and Rhythm: Normal rate.     Pulses: Normal pulses.  Musculoskeletal:     Cervical back: Tenderness present. No rigidity.     Right lower leg: No edema.     Left lower leg: No edema.     Comments: Patient has good strength in the upper extremities including 5 out of 5 strength in wrist extension long finger flexion and APB.  There is no atrophy of the hands intrinsically.  There is a negative Hoffmann's test.   Lymphadenopathy:     Cervical: No cervical adenopathy.  Skin:    Findings: No erythema, lesion or rash.  Neurological:     General: No focal deficit present.     Mental Status: She is alert and oriented to  person, place, and time.     Sensory: No sensory deficit.     Motor: No weakness or abnormal muscle tone.     Coordination: Coordination normal.  Psychiatric:        Mood and Affect: Mood normal.        Behavior: Behavior normal.      Imaging: No results found.

## 2023-10-05 NOTE — Progress Notes (Signed)
 Functional Pain Scale - descriptive words and definitions  Uncomfortable (3)  Pain is present but can complete all ADL's/sleep is slightly affected and passive distraction only gives marginal relief. Mild range order  Average Pain 3  92/56 +Driver, -BT, -Dye Allergies.

## 2023-10-05 NOTE — Telephone Encounter (Unsigned)
 Copied from CRM [id number]. Topic: General - Other >> Oct 05, 2023  9:00 AM Rosina BIRCH wrote: Reason for CRM: Rosina from Dr. Eldonna office called stating they need a form for a prior authorization to discontinue the pt XARELTO . Rosina stated it is urgent Fax [phone]

## 2023-10-05 NOTE — Procedures (Signed)
 Cervical Epidural Steroid Injection - Interlaminar Approach with Fluoroscopic Guidance  Patient: Danielle Rocha      Date of Birth: 20-Aug-1951 MRN: [id number] PCP: Katrinka Garnette KIDD, MD      Visit Date: 10/05/2023   Universal Protocol:    Date/Time: 10/04/2508:48 AM  Consent Given By: the patient  Position: PRONE  Additional Comments: Vital signs were monitored before and after the procedure. Patient was prepped and draped in the usual sterile fashion. The correct patient, procedure, and site was verified.   Injection Procedure Details:   Procedure diagnoses: Radiculopathy, cervical region [M54.12]    Meds Administered:  Meds ordered this encounter  Medications   methylPREDNISolone  acetate (DEPO-MEDROL ) injection 40 mg     Laterality: Right  Location/Site: C7-T1  Needle: 3.5 in., 20 ga. Tuohy  Needle Placement: Paramedian epidural space  Findings:  -Comments: Excellent flow of contrast into the epidural space.  Procedure Details: Using a paramedian approach from the side mentioned above, the region overlying the inferior lamina was localized under fluoroscopic visualization and the soft tissues overlying this structure were infiltrated with 4 ml. of 1% Lidocaine  without Epinephrine. A # 20 gauge, Tuohy needle was inserted into the epidural space using a paramedian approach.  The epidural space was localized using loss of resistance along with contralateral oblique bi-planar fluoroscopic views.  After negative aspirate for air, blood, and CSF, a 2 ml. volume of Isovue-250 was injected into the epidural space and the flow of contrast was observed. Radiographs were obtained for documentation purposes.   The injectate was administered into the level noted above.  Additional Comments:  No complications occurred Dressing: 2 x 2 sterile gauze and Band-Aid    Post-procedure details: Patient was observed during the procedure. Post-procedure instructions were  reviewed.  Patient left the clinic in stable condition.

## 2023-10-05 NOTE — Telephone Encounter (Signed)
Patient received injection this morning.

## 2023-10-14 ENCOUNTER — Encounter: Payer: Medicare HMO | Admitting: Internal Medicine

## 2023-11-18 ENCOUNTER — Other Ambulatory Visit (HOSPITAL_COMMUNITY): Payer: Self-pay | Admitting: Psychiatry

## 2023-11-27 DIAGNOSIS — I482 Chronic atrial fibrillation, unspecified: Secondary | ICD-10-CM | POA: Diagnosis not present

## 2023-11-27 DIAGNOSIS — F32A Depression, unspecified: Secondary | ICD-10-CM | POA: Diagnosis not present

## 2023-11-27 DIAGNOSIS — J45909 Unspecified asthma, uncomplicated: Secondary | ICD-10-CM | POA: Diagnosis not present

## 2023-11-27 DIAGNOSIS — K219 Gastro-esophageal reflux disease without esophagitis: Secondary | ICD-10-CM | POA: Diagnosis not present

## 2023-11-27 DIAGNOSIS — J302 Other seasonal allergic rhinitis: Secondary | ICD-10-CM | POA: Diagnosis not present

## 2023-11-27 DIAGNOSIS — E049 Nontoxic goiter, unspecified: Secondary | ICD-10-CM | POA: Diagnosis not present

## 2023-11-27 DIAGNOSIS — F5101 Primary insomnia: Secondary | ICD-10-CM | POA: Diagnosis not present

## 2023-11-27 DIAGNOSIS — M542 Cervicalgia: Secondary | ICD-10-CM | POA: Diagnosis not present

## 2023-11-27 DIAGNOSIS — E785 Hyperlipidemia, unspecified: Secondary | ICD-10-CM | POA: Diagnosis not present

## 2023-11-27 DIAGNOSIS — F419 Anxiety disorder, unspecified: Secondary | ICD-10-CM | POA: Diagnosis not present

## 2023-11-27 DIAGNOSIS — G43E19 Chronic migraine with aura, intractable, without status migrainosus: Secondary | ICD-10-CM | POA: Diagnosis not present

## 2023-12-23 ENCOUNTER — Encounter: Payer: Medicare HMO | Admitting: Family Medicine

## 2024-02-13 ENCOUNTER — Other Ambulatory Visit: Payer: Self-pay | Admitting: Family Medicine

## 2024-03-03 ENCOUNTER — Telehealth: Payer: Self-pay | Admitting: Family Medicine

## 2024-03-03 NOTE — Telephone Encounter (Signed)
 Patient moved to MD January 2025

## 2024-07-25 ENCOUNTER — Encounter: Payer: Self-pay | Admitting: Radiology

## 2024-08-30 ENCOUNTER — Other Ambulatory Visit: Payer: Self-pay | Admitting: Family Medicine

## 2024-09-21 ENCOUNTER — Other Ambulatory Visit: Payer: Self-pay | Admitting: Family Medicine
